# Patient Record
Sex: Female | Born: 1955
Health system: Southern US, Community
[De-identification: ages and names within clinical notes are randomized; demographics above are authoritative.]

## PROBLEM LIST (undated history)

## (undated) ENCOUNTER — Emergency Department (HOSPITAL_COMMUNITY): Admission: EM | Payer: Medicare Other | Source: Home / Self Care

## (undated) DIAGNOSIS — E785 Hyperlipidemia, unspecified: Secondary | ICD-10-CM

## (undated) DIAGNOSIS — I10 Essential (primary) hypertension: Secondary | ICD-10-CM

## (undated) DIAGNOSIS — R569 Unspecified convulsions: Secondary | ICD-10-CM

## (undated) DIAGNOSIS — I739 Peripheral vascular disease, unspecified: Secondary | ICD-10-CM

## (undated) DIAGNOSIS — J189 Pneumonia, unspecified organism: Secondary | ICD-10-CM

## (undated) DIAGNOSIS — E872 Acidosis, unspecified: Secondary | ICD-10-CM

## (undated) DIAGNOSIS — N186 End stage renal disease: Secondary | ICD-10-CM

## (undated) DIAGNOSIS — B2790 Infectious mononucleosis, unspecified without complication: Secondary | ICD-10-CM

## (undated) DIAGNOSIS — F32A Depression, unspecified: Secondary | ICD-10-CM

## (undated) DIAGNOSIS — F419 Anxiety disorder, unspecified: Secondary | ICD-10-CM

## (undated) DIAGNOSIS — D649 Anemia, unspecified: Secondary | ICD-10-CM

## (undated) DIAGNOSIS — M1990 Unspecified osteoarthritis, unspecified site: Secondary | ICD-10-CM

## (undated) DIAGNOSIS — Z8679 Personal history of other diseases of the circulatory system: Secondary | ICD-10-CM

## (undated) DIAGNOSIS — Z9981 Dependence on supplemental oxygen: Secondary | ICD-10-CM

## (undated) DIAGNOSIS — I82890 Acute embolism and thrombosis of other specified veins: Secondary | ICD-10-CM

## (undated) DIAGNOSIS — D126 Benign neoplasm of colon, unspecified: Secondary | ICD-10-CM

## (undated) DIAGNOSIS — G0490 Encephalitis and encephalomyelitis, unspecified: Secondary | ICD-10-CM

## (undated) DIAGNOSIS — I779 Disorder of arteries and arterioles, unspecified: Secondary | ICD-10-CM

## (undated) DIAGNOSIS — E875 Hyperkalemia: Secondary | ICD-10-CM

## (undated) DIAGNOSIS — Z9289 Personal history of other medical treatment: Secondary | ICD-10-CM

## (undated) DIAGNOSIS — K219 Gastro-esophageal reflux disease without esophagitis: Secondary | ICD-10-CM

## (undated) DIAGNOSIS — N189 Chronic kidney disease, unspecified: Secondary | ICD-10-CM

## (undated) DIAGNOSIS — F329 Major depressive disorder, single episode, unspecified: Secondary | ICD-10-CM

## (undated) DIAGNOSIS — Z87442 Personal history of urinary calculi: Secondary | ICD-10-CM

## (undated) HISTORY — DX: Anemia, unspecified: D64.9

## (undated) HISTORY — PX: REMOVAL OF GASTROSTOMY TUBE: SHX6058

## (undated) HISTORY — DX: Hyperlipidemia, unspecified: E78.5

## (undated) HISTORY — DX: Disorder of arteries and arterioles, unspecified: I77.9

## (undated) HISTORY — DX: Chronic kidney disease, unspecified: N18.9

## (undated) HISTORY — DX: Unspecified convulsions: R56.9

## (undated) HISTORY — PX: DG AV DIALYSIS GRAFT DECLOT OR: HXRAD813

## (undated) HISTORY — DX: Personal history of other medical treatment: Z92.89

## (undated) HISTORY — PX: BRAIN BIOPSY: SHX905

## (undated) HISTORY — DX: Acidosis, unspecified: E87.20

## (undated) HISTORY — DX: Essential (primary) hypertension: I10

## (undated) HISTORY — DX: Benign neoplasm of colon, unspecified: D12.6

## (undated) HISTORY — DX: Pneumonia, unspecified organism: J18.9

## (undated) HISTORY — PX: COLONOSCOPY W/ POLYPECTOMY: SHX1380

## (undated) HISTORY — DX: Acidosis: E87.2

## (undated) HISTORY — PX: ABDOMINAL HYSTERECTOMY: SHX81

## (undated) HISTORY — PX: GASTROSTOMY TUBE PLACEMENT: SHX655

## (undated) HISTORY — DX: Gastro-esophageal reflux disease without esophagitis: K21.9

## (undated) HISTORY — DX: Hyperkalemia: E87.5

## (undated) HISTORY — DX: Peripheral vascular disease, unspecified: I73.9

---

## 1998-03-06 ENCOUNTER — Encounter: Payer: Self-pay | Admitting: Gastroenterology

## 1998-04-24 DIAGNOSIS — D126 Benign neoplasm of colon, unspecified: Secondary | ICD-10-CM

## 1998-04-24 HISTORY — DX: Benign neoplasm of colon, unspecified: D12.6

## 1998-05-03 ENCOUNTER — Emergency Department (HOSPITAL_COMMUNITY): Admission: EM | Admit: 1998-05-03 | Discharge: 1998-05-03 | Payer: Self-pay | Admitting: Emergency Medicine

## 1998-05-03 ENCOUNTER — Encounter: Payer: Self-pay | Admitting: Emergency Medicine

## 1998-05-11 ENCOUNTER — Encounter: Payer: Self-pay | Admitting: Gastroenterology

## 1998-05-11 ENCOUNTER — Other Ambulatory Visit: Admission: RE | Admit: 1998-05-11 | Discharge: 1998-05-11 | Payer: Self-pay | Admitting: Gastroenterology

## 1999-05-22 ENCOUNTER — Encounter: Payer: Self-pay | Admitting: Family Medicine

## 1999-05-22 ENCOUNTER — Ambulatory Visit (HOSPITAL_COMMUNITY): Admission: RE | Admit: 1999-05-22 | Discharge: 1999-05-22 | Payer: Self-pay | Admitting: Family Medicine

## 2000-02-28 ENCOUNTER — Other Ambulatory Visit: Admission: RE | Admit: 2000-02-28 | Discharge: 2000-02-28 | Payer: Self-pay | Admitting: Obstetrics and Gynecology

## 2000-03-05 ENCOUNTER — Encounter: Payer: Self-pay | Admitting: Obstetrics and Gynecology

## 2000-03-05 ENCOUNTER — Encounter: Admission: RE | Admit: 2000-03-05 | Discharge: 2000-03-05 | Payer: Self-pay | Admitting: Obstetrics and Gynecology

## 2005-02-05 ENCOUNTER — Other Ambulatory Visit: Admission: RE | Admit: 2005-02-05 | Discharge: 2005-02-05 | Payer: Self-pay | Admitting: Obstetrics and Gynecology

## 2005-02-20 ENCOUNTER — Ambulatory Visit: Payer: Self-pay | Admitting: Gastroenterology

## 2005-04-08 ENCOUNTER — Ambulatory Visit: Payer: Self-pay | Admitting: Gastroenterology

## 2005-05-09 ENCOUNTER — Ambulatory Visit: Payer: Self-pay | Admitting: Gastroenterology

## 2005-05-18 ENCOUNTER — Inpatient Hospital Stay (HOSPITAL_COMMUNITY): Admission: EM | Admit: 2005-05-18 | Discharge: 2005-05-24 | Payer: Self-pay | Admitting: Emergency Medicine

## 2005-07-04 ENCOUNTER — Ambulatory Visit (HOSPITAL_COMMUNITY): Admission: RE | Admit: 2005-07-04 | Discharge: 2005-07-04 | Payer: Self-pay | Admitting: Vascular Surgery

## 2005-07-04 HISTORY — PX: AV FISTULA PLACEMENT: SHX1204

## 2005-07-16 ENCOUNTER — Ambulatory Visit: Payer: Self-pay | Admitting: Cardiology

## 2005-07-16 ENCOUNTER — Encounter: Payer: Self-pay | Admitting: Cardiology

## 2005-07-16 ENCOUNTER — Ambulatory Visit (HOSPITAL_COMMUNITY): Admission: RE | Admit: 2005-07-16 | Discharge: 2005-07-16 | Payer: Self-pay | Admitting: Nephrology

## 2005-07-24 ENCOUNTER — Ambulatory Visit (HOSPITAL_COMMUNITY): Admission: RE | Admit: 2005-07-24 | Discharge: 2005-07-24 | Payer: Self-pay | Admitting: Vascular Surgery

## 2005-07-24 HISTORY — PX: DG AV DIALYSIS GRAFT DECLOT OR: HXRAD813

## 2005-08-20 ENCOUNTER — Ambulatory Visit (HOSPITAL_COMMUNITY): Admission: RE | Admit: 2005-08-20 | Discharge: 2005-08-20 | Payer: Self-pay | Admitting: *Deleted

## 2005-08-27 ENCOUNTER — Ambulatory Visit (HOSPITAL_COMMUNITY): Admission: RE | Admit: 2005-08-27 | Discharge: 2005-08-27 | Payer: Self-pay | Admitting: Vascular Surgery

## 2005-08-27 HISTORY — PX: AV FISTULA PLACEMENT W/ PTFE: SHX1205

## 2005-08-27 HISTORY — PX: AV FISTULA PLACEMENT: SHX1204

## 2005-10-01 ENCOUNTER — Ambulatory Visit (HOSPITAL_COMMUNITY): Admission: RE | Admit: 2005-10-01 | Discharge: 2005-10-01 | Payer: Self-pay | Admitting: Nephrology

## 2006-03-20 ENCOUNTER — Ambulatory Visit (HOSPITAL_COMMUNITY): Admission: RE | Admit: 2006-03-20 | Discharge: 2006-03-20 | Payer: Self-pay | Admitting: Nephrology

## 2006-09-24 ENCOUNTER — Ambulatory Visit (HOSPITAL_COMMUNITY): Admission: RE | Admit: 2006-09-24 | Discharge: 2006-09-24 | Payer: Self-pay | Admitting: Nephrology

## 2006-10-12 ENCOUNTER — Ambulatory Visit: Payer: Self-pay | Admitting: Vascular Surgery

## 2006-10-12 ENCOUNTER — Ambulatory Visit (HOSPITAL_COMMUNITY): Admission: RE | Admit: 2006-10-12 | Discharge: 2006-10-12 | Payer: Self-pay | Admitting: Vascular Surgery

## 2006-10-12 HISTORY — PX: THROMBECTOMY / ARTERIOVENOUS GRAFT REVISION: SUR1351

## 2006-10-16 ENCOUNTER — Ambulatory Visit (HOSPITAL_COMMUNITY): Admission: RE | Admit: 2006-10-16 | Discharge: 2006-10-16 | Payer: Self-pay | Admitting: Vascular Surgery

## 2006-10-16 HISTORY — PX: THROMBECTOMY / ARTERIOVENOUS GRAFT REVISION: SUR1351

## 2006-10-31 ENCOUNTER — Emergency Department (HOSPITAL_COMMUNITY): Admission: EM | Admit: 2006-10-31 | Discharge: 2006-10-31 | Payer: Self-pay | Admitting: Emergency Medicine

## 2006-12-15 ENCOUNTER — Ambulatory Visit (HOSPITAL_COMMUNITY): Admission: RE | Admit: 2006-12-15 | Discharge: 2006-12-15 | Payer: Self-pay | Admitting: Nephrology

## 2007-03-25 HISTORY — PX: KIDNEY TRANSPLANT: SHX239

## 2007-12-22 ENCOUNTER — Encounter: Admission: RE | Admit: 2007-12-22 | Discharge: 2007-12-22 | Payer: Self-pay

## 2009-04-17 ENCOUNTER — Encounter (INDEPENDENT_AMBULATORY_CARE_PROVIDER_SITE_OTHER): Payer: Self-pay | Admitting: *Deleted

## 2009-04-25 ENCOUNTER — Encounter: Payer: Self-pay | Admitting: Physician Assistant

## 2009-04-25 ENCOUNTER — Telehealth: Payer: Self-pay | Admitting: Gastroenterology

## 2009-04-25 ENCOUNTER — Ambulatory Visit: Payer: Self-pay | Admitting: Gastroenterology

## 2009-04-25 DIAGNOSIS — R112 Nausea with vomiting, unspecified: Secondary | ICD-10-CM

## 2009-04-25 DIAGNOSIS — I10 Essential (primary) hypertension: Secondary | ICD-10-CM | POA: Insufficient documentation

## 2009-04-25 DIAGNOSIS — K649 Unspecified hemorrhoids: Secondary | ICD-10-CM | POA: Insufficient documentation

## 2009-04-25 DIAGNOSIS — R634 Abnormal weight loss: Secondary | ICD-10-CM | POA: Insufficient documentation

## 2009-04-25 DIAGNOSIS — N189 Chronic kidney disease, unspecified: Secondary | ICD-10-CM | POA: Insufficient documentation

## 2009-04-25 DIAGNOSIS — R110 Nausea: Secondary | ICD-10-CM | POA: Insufficient documentation

## 2009-04-25 DIAGNOSIS — Z860100 Personal history of colon polyps, unspecified: Secondary | ICD-10-CM | POA: Insufficient documentation

## 2009-04-25 DIAGNOSIS — R197 Diarrhea, unspecified: Secondary | ICD-10-CM | POA: Insufficient documentation

## 2009-04-25 HISTORY — DX: Nausea with vomiting, unspecified: R11.2

## 2009-04-25 LAB — CONVERTED CEMR LAB
Basophils Absolute: 0 10*3/uL (ref 0.0–0.1)
Eosinophils Absolute: 0.1 10*3/uL (ref 0.0–0.7)
Eosinophils Relative: 1 % (ref 0–5)
HCT: 36.7 % (ref 36.0–46.0)
Lymphs Abs: 1.3 10*3/uL (ref 0.7–4.0)
MCV: 89.1 fL (ref 78.0–100.0)
Monocytes Relative: 4 % (ref 3–12)
Neutro Abs: 8.1 10*3/uL — ABNORMAL HIGH (ref 1.7–7.7)
RDW: 14.4 % (ref 11.5–15.5)

## 2009-04-27 LAB — CONVERTED CEMR LAB
ALT: 18 units/L (ref 0–35)
AST: 19 units/L (ref 0–37)
Alkaline Phosphatase: 86 units/L (ref 39–117)
BUN: 51 mg/dL — ABNORMAL HIGH (ref 6–23)
Glucose, Bld: 152 mg/dL — ABNORMAL HIGH (ref 70–99)
Sodium: 137 meq/L (ref 135–145)
Total Bilirubin: 0.3 mg/dL (ref 0.3–1.2)

## 2009-05-11 ENCOUNTER — Ambulatory Visit: Payer: Self-pay | Admitting: Gastroenterology

## 2009-05-11 DIAGNOSIS — R1012 Left upper quadrant pain: Secondary | ICD-10-CM | POA: Insufficient documentation

## 2009-05-14 LAB — CONVERTED CEMR LAB
Basophils Absolute: 0.4 10*3/uL — ABNORMAL HIGH (ref 0.0–0.1)
Basophils Relative: 6.7 % — ABNORMAL HIGH (ref 0.0–3.0)
Bilirubin, Direct: 0.2 mg/dL (ref 0.0–0.3)
Eosinophils Absolute: 0.1 10*3/uL (ref 0.0–0.7)
Eosinophils Relative: 1 % (ref 0.0–5.0)
GFR calc non Af Amer: 31.91 mL/min (ref >60)
MCV: 92.9 fL (ref 78.0–100.0)
Monocytes Relative: 2.3 % — ABNORMAL LOW (ref 3.0–12.0)
Neutrophils Relative %: 75 % (ref 43.0–77.0)
Platelets: 208 10*3/uL (ref 150.0–400.0)
Potassium: 3.3 meq/L — ABNORMAL LOW (ref 3.5–5.1)
RDW: 14.6 % (ref 11.5–14.6)
Sodium: 139 meq/L (ref 135–145)
Total Protein: 6.9 g/dL (ref 6.0–8.3)
WBC: 6 10*3/uL (ref 4.5–10.5)

## 2009-05-17 ENCOUNTER — Telehealth: Payer: Self-pay | Admitting: Gastroenterology

## 2009-05-18 ENCOUNTER — Ambulatory Visit: Payer: Self-pay | Admitting: Cardiology

## 2009-05-30 ENCOUNTER — Encounter: Payer: Self-pay | Admitting: Gastroenterology

## 2009-06-11 ENCOUNTER — Encounter (INDEPENDENT_AMBULATORY_CARE_PROVIDER_SITE_OTHER): Payer: Self-pay

## 2009-06-12 ENCOUNTER — Ambulatory Visit: Payer: Self-pay | Admitting: Gastroenterology

## 2009-06-19 ENCOUNTER — Ambulatory Visit: Payer: Self-pay | Admitting: Gastroenterology

## 2009-06-21 ENCOUNTER — Encounter: Payer: Self-pay | Admitting: Gastroenterology

## 2009-06-25 ENCOUNTER — Encounter: Payer: Self-pay | Admitting: Gastroenterology

## 2009-07-31 ENCOUNTER — Telehealth: Payer: Self-pay | Admitting: Gastroenterology

## 2009-08-01 ENCOUNTER — Encounter: Payer: Self-pay | Admitting: Gastroenterology

## 2009-08-13 ENCOUNTER — Ambulatory Visit: Payer: Self-pay | Admitting: Gastroenterology

## 2009-08-13 DIAGNOSIS — K219 Gastro-esophageal reflux disease without esophagitis: Secondary | ICD-10-CM | POA: Insufficient documentation

## 2009-08-13 DIAGNOSIS — K5289 Other specified noninfective gastroenteritis and colitis: Secondary | ICD-10-CM | POA: Insufficient documentation

## 2009-12-12 ENCOUNTER — Encounter: Payer: Self-pay | Admitting: Gastroenterology

## 2009-12-21 ENCOUNTER — Encounter: Payer: Self-pay | Admitting: Gastroenterology

## 2010-01-15 ENCOUNTER — Ambulatory Visit: Payer: Self-pay | Admitting: Gastroenterology

## 2010-04-14 ENCOUNTER — Encounter: Payer: Self-pay | Admitting: Nephrology

## 2010-04-15 ENCOUNTER — Ambulatory Visit: Admit: 2010-04-15 | Payer: Self-pay | Admitting: Gastroenterology

## 2010-04-23 NOTE — Progress Notes (Signed)
Summary: [facility]  [place]   Imported By: Phillis Knack 05/11/2009 14:52:50  _____________________________________________________________________  External Attachment:    Type:   Image     Comment:   External Document

## 2010-04-23 NOTE — Letter (Signed)
Summary: [facility] Instructions  [facility]  [place], [place] [address]   Phone: [phone]  Fax: [phone]       KAYLONIE BLANDFORD    1955/08/19    MRN: [id number]        Procedure Day /Date:  Tuesday 06/19/2009     Arrival Time: 12:30 pm      Procedure Time: 1:30 pm     Location of Procedure:                    _ x_  [place] (4th Floor)                        [place]   Starting 5 days prior to your procedure Thursday 3/24  do not eat nuts, seeds, popcorn, corn, beans, peas,  salads, or any raw vegetables.  Do not take any fiber supplements (e.g. Metamucil, Citrucel, and Benefiber).  THE DAY BEFORE YOUR PROCEDURE         DATE: Monday 3/28  1.  Drink clear liquids the entire day-NO SOLID FOOD  2.  Do not drink anything colored red or purple.  Avoid juices with pulp.  No orange juice.  3.  Drink at least 64 oz. (8 glasses) of fluid/clear liquids during the day to prevent dehydration and help the prep work efficiently.  CLEAR LIQUIDS INCLUDE: Water Jello Ice Popsicles Tea (sugar ok, no milk/cream) Powdered fruit flavored drinks Coffee (sugar ok, no milk/cream) Gatorade Juice: apple, white grape, white cranberry  Lemonade Clear bullion, consomm, broth Carbonated beverages (any kind) Strained chicken noodle soup Hard Candy                             4.  In the morning, mix first dose of MoviPrep solution:    Empty 1 Pouch A and 1 Pouch B into the disposable container    Add lukewarm drinking water to the top line of the container. Mix to dissolve    Refrigerate (mixed solution should be used within 24 hrs)  5.  Begin drinking the prep at 5:00 p.m. The MoviPrep container is divided by 4 marks.   Every 15 minutes drink the solution down to the next mark (approximately 8 oz) until the full liter is complete.   6.  Follow completed prep with 16 oz of clear liquid of your choice  (Nothing red or purple).  Continue to drink clear liquids until bedtime.  7.  Before going to bed, mix second dose of MoviPrep solution:    Empty 1 Pouch A and 1 Pouch B into the disposable container    Add lukewarm drinking water to the top line of the container. Mix to dissolve    Refrigerate  THE DAY OF YOUR PROCEDURE      DATE: Tuesday 3/29  Beginning at 8:30 a.m. (5 hours before procedure):         1. Every 15 minutes, drink the solution down to the next mark (approx 8 oz) until the full liter is complete.  2. Follow completed prep with 16 oz. of clear liquid of your choice.    3. You may drink clear liquids until 11:30 am (2 HOURS BEFORE PROCEDURE).   MEDICATION INSTRUCTIONS  Unless otherwise instructed, you should take regular prescription medications with a small sip of water   as early as possible the morning  of your procedure.    Additional medication instructions: Do not take fluid pill am of procedure         OTHER INSTRUCTIONS  You will need a responsible adult at least 53 years of age to accompany you and drive you home.   This person must remain in the waiting room during your procedure.  Wear loose fitting clothing that is easily removed.  Leave jewelry and other valuables at home.  However, you may wish to bring a book to read or  an iPod/MP3 player to listen to music as you wait for your procedure to start.  Remove all body piercing jewelry and leave at home.  Total time from sign-in until discharge is approximately 2-3 hours.  You should go home directly after your procedure and rest.  You can resume normal activities the  day after your procedure.  The day of your procedure you should not:   Drive   Make legal decisions   Operate machinery   Drink alcohol   Return to work  You will receive specific instructions about eating, activities and medications before you leave.    The above instructions have been reviewed and explained to  me by   Cornelia Copa RN  June 12, 2009 10:46 AM     I fully understand and can verbalize these instructions _____________________________ Date _________

## 2010-04-23 NOTE — Progress Notes (Signed)
Summary: vomiting   Phone Note Call from Patient Call back at [phone]  OR  [phone]   Caller: Patient Call For: Dr. Fuller Plan Reason for Call: Talk to Nurse Summary of Call: pt has appt on the 18th, but wants to kow what she can do to treat her symptoms until then... pt said she started with a stomach virus and since then has not been ale to eat anything without throwing it back up Initial call taken by: Lucien Mons,  April 25, 2009 8:12 AM  Follow-up for Phone Call        Left message for patient to call back [place], [facility]  April 25, 2009 8:55 AM  Patient has 3 week hx of nausea and vomiting and now diarrhea.  All started with a stomach virus.  Patient  states diarrhea right after everything she eats.  patient will come in and see Nicoletta Ba PA today at 11:00 Follow-up by: Barb Merino RN, CGRN,  April 25, 2009 9:06 AM

## 2010-04-23 NOTE — Letter (Signed)
Summary: Results Letter  [facility]  [place], [place] [address]   Phone: [phone]  Fax: [phone]        June 25, 2009 MRN: [id number]    [place], [place]  [address]    Dear Ms. Buchta,  I have been unable to reach you by phone.  I hope this letter finds you well.  Dr Fuller Plan has recommended you start on a new medication called Lialda to treat the colitis that was found during your colonoscopy.  I have enclosed a discount card to help with the co-pay on your insurance.  Please follow the instructions on the card for activation prior to using at the pharmacy.  I have sent the perscription to [facility] on [address].  You will take 2 pills one time a day.  Dr Fuller Plan also wants to see you back in the office on 08-13-09 at 9:30.  If this appointment date and time are not convenient for you please call  [phone] and reschedule.  Please bring any co-pays with your insurance and if you need to cancel or reschedule please call the above number 24 hours in advance of the appointment.    If you have any questions please call and ask to speak with [facility].    Sincerely,     Barb Merino RN, CGRN  This letter has been electronically signed by your physician.

## 2010-04-23 NOTE — Letter (Signed)
Summary: [facility]   Imported By: Bubba Hales 06/20/2009 10:38:14  _____________________________________________________________________  External Attachment:    Type:   Image     Comment:   External Document

## 2010-04-23 NOTE — Miscellaneous (Signed)
Summary: Lec previsit  Clinical Lists Changes  Medications: Added new medication of MOVIPREP 100 GM  SOLR (PEG-KCL-NACL-NASULF-NA ASC-C) As per prep instructions. - Signed Rx of MOVIPREP 100 GM  SOLR (PEG-KCL-NACL-NASULF-NA ASC-C) As per prep instructions.;  #1 x 0;  Signed;  Entered by: Cornelia Copa RN;  Authorized by: Ladene Artist MD [facility];  Method used: Electronically to [facility]  [phone]*, [address], [place], [place]  [address], Ph: [phone] or [phone], Fax: [phone] Observations: Added new observation of ALLERGY REV: Done (06/12/2009 10:23)    Prescriptions: MOVIPREP 100 GM  SOLR (PEG-KCL-NACL-NASULF-NA ASC-C) As per prep instructions.  #1 x 0   Entered by:   Cornelia Copa RN   Authorized by:   Ladene Artist MD [facility]   Signed by:   Cornelia Copa RN on 06/12/2009   Method used:   Electronically to        [facility]  [phone]* (retail)       [place], [place]  [address]       Ph: [phone] or [phone]       Fax: [phone]   RxID:   [id number]

## 2010-04-23 NOTE — Progress Notes (Signed)
Summary: triage  Medications Added ANUSOL-HC 25 MG SUPP (HYDROCORTISONE ACETATE) 1 pr q hs for 7 nights       Phone Note Call from [name] [name] Call back at [phone]   Caller: [name] [name] Call For: Dr. Fuller Plan Reason for Call: Talk to Nurse Summary of Call: would like sooner appt then currently sch'ed... pt says she is experiencing "hot flashes of fever" Initial call taken by: Lucien Mons,  Jul 31, 2009 10:10 AM  Follow-up for Phone Call        [name] [name] c/o bright red rectal bleeding. She also reports an increase in hot flashes.   She sees blood on the tissue when she wipes.  Her colitis symptoms have been under good control no cramping, pain, diarrhea  or mucus.  She notes an increase in bleeding in the last few days.  She reports a solid stool every am.  Currently on Lialda 2 by mouth once daily.  She has a follow up appointment with you on 08/13/09.  Please advise if needs additional treatment prior to appointment  Follow-up by: Barb Merino RN, CGRN,  Jul 31, 2009 10:22 AM  Additional Follow-up for Phone Call Additional follow up Details #1::        She as a mild nonspecific colitis and hemorrhoids. Start Anusol HC supp qd for 7 days then prn for hemorrhoidal bleeding.  She needs to see her PCP or nephrologist for her hot flashes, ? fevers. Keep her 5/23 appt with me. Additional Follow-up by: Ladene Artist MD Marval Regal,  Jul 31, 2009 10:35 AM    Additional Follow-up for Phone Call Additional follow up Details #2::    [name] [name] aware.  She has a follow up appointment at her Primary care next week. Follow-up by: Barb Merino RN, [place],  Jul 31, 2009 10:47 AM  New/Updated Medications: ANUSOL-HC 25 MG SUPP (HYDROCORTISONE ACETATE) 1 pr q hs for 7 nights Prescriptions: ANUSOL-HC 25 MG SUPP (HYDROCORTISONE ACETATE) 1 pr q hs for 7 nights  #7 x 0   Entered by:   Barb Merino RN, [place]   Authorized by:   Ladene Artist MD [facility]   Signed by:   Barb Merino RN, CGRN on 07/31/2009   Method used:    Electronically to        [facility]  [phone]* (retail)       [place], [place]  [address]       Ph: [phone] or [phone]       Fax: [phone]   RxID:   [id number]

## 2010-04-23 NOTE — Procedures (Signed)
Summary: Colonoscopy  Patient: [name] [name] Note: All result statuses are Final unless otherwise noted.  Tests: (1) Colonoscopy (COL)   COL Colonoscopy           [facility]     [place], [place]  [address]           COLONOSCOPY PROCEDURE REPORT           PATIENT:  [name] [name]  MR#:  [id number]     BIRTHDATE:  May 21, 1955, 53 yrs. old  GENDER:  female           ENDOSCOPIST:  [name] Sorenson T. Fuller Plan, MD, [facility]           PROCEDURE DATE:  06/19/2009     PROCEDURE:  Colonoscopy with biopsy     ASA CLASS:  Class III     INDICATIONS:  1) unexplained diarrhea  2) weight loss           MEDICATIONS:   Fentanyl 100 mcg IV, Versed 10 mg IV           DESCRIPTION OF PROCEDURE:   After the risks benefits and     alternatives of the procedure were thoroughly explained, informed     consent was obtained.  Digital rectal exam was performed and     revealed no abnormalities.   The LB [id number]-[id number] endoscope     was introduced through the anus and advanced to the terminal ileum     which was intubated for a short distance, limited by fair prep.     The quality of the prep was Moviprep fair.  The instrument was then     slowly withdrawn as the colon was fully examined.     <<PROCEDUREIMAGES>>           FINDINGS:  The terminal ileum appeared normal.  Abnormal appearing     mucosa in the cecum to the transverse colon. There was patchy     erythema and mildly edematous mucosa. Multiple biopsies were     obtained and sent to pathology.  Normal colon otherwise from the     splenic flexure to the rectum. Random biopsies taken and sent to     pathology.   Retroflexed views in the rectum revealed internal     hemorrhoids, small.  The time to cecum =  4  minutes. The scope     was then withdrawn (time =  10.67  min) from the patient and the     procedure completed.           COMPLICATIONS:  None           ENDOSCOPIC IMPRESSION:     1) Normal terminal ileum    2) Patcy abnormal mucosa in the right colon-R/O colitis     3) Internal hemorrhoids           RECOMMENDATIONS:     1) Await pathology results     2) Repeat Colonscopy in 10 years for routine CRC screening     3) EGD today           [name] Traub T. Fuller Plan, MD, [name] [name]           CC: [name] Area, MD           n.     [name] [name]. [name] [name] at 06/19/2009 02:19 PM           [name] [name] [name] [name]  Note: An exclamation mark (!) indicates a result that was not dispersed into the flowsheet. Document Creation Date: 06/19/2009 2:19 PM _______________________________________________________________________  (1) Order result status: Final Collection or observation date-time: 06/19/2009 14:13 Requested date-time:  Receipt date-time:  Reported date-time:  Referring Physician:   Ordering Physician: [name] [name] [phone]) Specimen Source:  Source: [name] [name] Order Number: [id number] Lab site:   Appended Document: Colonoscopy     Procedures Next Due Date:    Colonoscopy: 05/2019  Appended Document: Colonoscopy colon 05/2014

## 2010-04-23 NOTE — Letter (Signed)
Summary: Patient Notice- Colon Biospy Results  [facility]  [address] [place], [place] [address]   Phone: [phone]  Fax: [phone]        June 21, 2009 MRN: [id number]    [place], [place]  [address]    Dear [name] [name],  I am pleased to inform you that the biopsies taken during your recent colonoscopy did not show any evidence of cancer upon pathologic examination. The colon biosies showed a mild, nonspecific colitis. The duodenal biopsies showed chronic duodenitis.  Please call [phone] to schedule a return visit in 6-8 weeks to review your condition.  Continue with the treatment plan as outlined on the day of your      exam.  You should have a repeat colonoscopy examination in 10 years.  Please call us if you are having persistent problems or have questions about your condition that have not been fully answered at this time.  Sincerely,  [name] Artist MD [facility]  This letter has been electronically signed by your physician.  Appended Document: Patient Notice- Colon Biospy Results Letter mailed 4.1.11

## 2010-04-23 NOTE — Assessment & Plan Note (Signed)
Summary: follow up colitis/sheri   History of Present Illness Visit Type: Follow-up Visit Primary GI MD: Joylene Igo MD [facility] Primary Provider: Mauricia Area, MD Requesting Provider: n/a Chief Complaint: Patient is here for f/u colitis. Patient states that since beginning lialda, she feels better. She denies any appearance of blood in the stool. No abdominal pain, no diarrhea, no nausea or vomiting. History of Present Illness:   This is a return office visit for diarrhea, bloating, rectal bleeding and GERD. All her symptoms are under very good control, except for bloating, and mild hemorrhoidal prolapse. Her colonic biopsies showed a very minimal, nonspecific colitis, which did not have features of chronic inflammatory bowel disease.   GI Review of Systems    Reports bloating.      Denies abdominal pain, acid reflux, belching, chest pain, dysphagia with liquids, dysphagia with solids, heartburn, loss of appetite, nausea, vomiting, vomiting blood, weight loss, and  weight gain.      Reports hemorrhoids.     Denies anal fissure, black tarry stools, change in bowel habit, constipation, diarrhea, diverticulosis, fecal incontinence, heme positive stool, irritable bowel syndrome, jaundice, light color stool, liver problems, rectal bleeding, and  rectal pain.   Current Medications (verified): 1)  Norvasc 10 Mg Tabs (Amlodipine Besylate) .... One Tablet By Mouth Once Daily 2)  Lipitor 40 Mg Tabs (Atorvastatin Calcium) .... One Tablet By Mouth Once Daily 3)  Elite Magnesium 100 Mg Tabs (Magnesium) .... One Tablet By Mouth Once Daily 4)  Multivitamins   Tabs (Multiple Vitamin) .... One Tablet By Mouth Once Daily 5)  Aspirin 325 Mg  Tabs (Aspirin) .... One Tablet By Mouth Two Times A Day 6)  Omeprazole 40 Mg Cpdr (Omeprazole) .... One Tablet By Mouth Once Daily 7)  Vivelle-Dot 0.075 Mg/24hr Pttw (Estradiol) .... As Directed 8)  Amlodipine Besylate 10 Mg Tabs (Amlodipine Besylate) .... One  Tablet By Mouth Once Daily 9)  Mycophenolate Mofetil 250 Mg Caps (Mycophenolate Mofetil) .... Take Four Capsules By Mouth Two Times A Day 10)  Tacrolimus 5 Mg Caps (Tacrolimus) .... One Capsule By Mouth Two Times A Day 11)  Prednisone 5 Mg Tabs (Prednisone) .... As Directed 12)  Lialda 1.2 Gm Tbec (Mesalamine) .... 2.4 By Mouth Once Daily 13)  Tacrolimus 1 Mg Caps (Tacrolimus) .... Take Two Tables By Mouth Two Times A Day  Allergies (verified): 1)  ! Benadryl 2)  ! * Lisinopril  Past History:  Past Medical History: Chronic Kidney Disease-S/P RENAL TRANSPLANTS 2009 Pneumonia Hypertension Hyperlipidemia Hemorrhoids Grade I Distal esophagitis Adenomatous Colon Polyps 04/1998 Colitis, nonspecific  Past Surgical History: Reviewed history from 05/10/2009 and no changes required. Hysterectomy Bilateral Kidney Transplant 2009,BOTH  IN RIGHT ABDOMEN C-section  Family History: Reviewed history from 04/25/2009 and no changes required. No FH of Colon Cancer: Family History of Kidney Disease: Paternal 87 and Cousin  Family History of Heart Disease: Mother and Father   Social History: Reviewed history from 04/25/2009 and no changes required. Occupation: Disabled Patient has never smoked.  Alcohol Use - no Daily Caffeine Use: one daily  Illicit Drug Use - no  Review of Systems  The patient denies allergy/sinus, anemia, anxiety-new, arthritis/joint pain, back pain, blood in urine, breast changes/lumps, change in vision, confusion, cough, coughing up blood, depression-new, fainting, fatigue, fever, headaches-new, hearing problems, heart murmur, heart rhythm changes, itching, menstrual pain, muscle pains/cramps, night sweats, nosebleeds, pregnancy symptoms, shortness of breath, skin rash, sleeping problems, sore throat, swelling of feet/legs, swollen lymph glands, thirst -  excessive , urination - excessive , urination changes/pain, urine leakage, vision changes, and voice change.     Vital Signs:  Patient profile:   55 year old female Height:      67 inches Weight:      174 pounds BMI:     27.35 BSA:     1.91 Pulse rate:   64 / minute Pulse rhythm:   regular BP sitting:   124 / 74  (left arm)  Vitals Entered By: Madlyn Frankel CMA Deborra Medina) (Aug 13, 2009 9:23 AM)  Physical Exam  General:  Well developed, well nourished, no acute distress. Head:  Normocephalic and atraumatic. Eyes:  PERRLA, no icterus. Mouth:  No deformity or lesions, dentition normal. Lungs:  Clear throughout to auscultation. Heart:  Regular rate and rhythm; no murmurs, rubs,  or bruits. Abdomen:  Soft, nontender and nondistended. No masses, hepatosplenomegaly or hernias noted. Normal bowel sounds. Psych:  Alert and cooperative. Normal mood and affect.  Impression & Recommendations:  Problem # 1:  OTH&UNSPEC NONINFECTIOUS GASTROENTERITIS&COLITIS (ICD-558.9) Nonspecific mild colitis. Likely self-limited. Plan to continue EL the for 2 more months, and then discontinue with her diarrhea remains fully controlled.  Problem # 2:  GERD (ICD-530.81) Symptoms well controlled on omeprazole 40 mg daily and antireflux measures.  Problem # 3:  HEMORRHOIDS-INTERNAL (ICD-455.0) Hemorrhoidal bleeding has resolved, but she does note frequent prolapse. Resume Anusol HC suppositories for one to 2 weeks.  Problem # 4:  WEIGHT LOSS (ICD-783.21) Weight has stabilized and has increased 4 pounds since her last visit.  Problem # 5:  PERSONAL HX COLONIC POLYPS (ICD-V12.72) Surveillance colonoscopy recommended March 2016.  Patient Instructions: 1)  Your prescription has been sent to your pharmacy. 2)  Please schedule a follow-up appointment in 2 months.  3)  Copy sent to : Mauricia Area, MD 4)  The medication list was reviewed and reconciled.  All changed / newly prescribed medications were explained.  A complete medication list was provided to the patient / caregiver.  Prescriptions: ANUSOL-HC 25 MG  SUPP (HYDROCORTISONE ACETATE) one suppository into rectum at bedtime as needed  #14 x 2   Entered by:   Marlon Pel CMA ([place])   Authorized by:   Ladene Artist MD [facility]   Signed by:   Ladene Artist MD FACG on 08/13/2009   Method used:   Electronically to        [facility]  [phone]* (retail)       [place], [place]  [address]       Ph: [phone] or [phone]       Fax: [phone]   RxID:   [id number]   Appended Document: follow up colitis/sheri    Clinical Lists Changes  Observations: Added new observation of COLONNXTDUE: 05/2014 (08/13/2009 10:13)

## 2010-04-23 NOTE — Letter (Signed)
Summary: [facility]   Imported By: Bubba Hales 08/17/2009 08:26:27  _____________________________________________________________________  External Attachment:    Type:   Image     Comment:   External Document

## 2010-04-23 NOTE — Assessment & Plan Note (Signed)
Summary: nausea...as.   History of Present Illness Visit Type: Follow-up Visit Primary GI MD: Joylene Igo MD [facility] Primary Provider: Mauricia Area, MD  Requesting Provider: n/a Chief Complaint: follow-up visit diarrhea/abdominal cramping   weight loss  saw Amy esterwood last week History of Present Illness:   Ms. Oreilly returns for ongoing problems with diarrhea, left upper quadrant abdominal pain, weight loss, loss of appetite, and nausea. She was treated with a course of Flagyl and states her diarrhea slightly improved, but her other symptoms have not. She had an acute deterioration of her renal function, with a creatinine increasing to 3.7 and BUN to 51. She states her most recent creatinine was 2.0 done at [facility], and her baseline creatinine is about 1.5. This acute change was likely secondary to dehydration in the setting of prior renal transplants.   GI Review of Systems    Reports abdominal pain, loss of appetite, nausea, and  weight loss.     Location of  Abdominal pain: LUQ.    Denies acid reflux, belching, bloating, chest pain, dysphagia with liquids, dysphagia with solids, heartburn, vomiting, vomiting blood, and  weight gain.      Reports diarrhea.     Denies anal fissure, black tarry stools, change in bowel habit, constipation, diverticulosis, fecal incontinence, heme positive stool, hemorrhoids, irritable bowel syndrome, jaundice, light color stool, liver problems, rectal bleeding, and  rectal pain.   Current Medications (verified): 1)  Norvasc 10 Mg Tabs (Amlodipine Besylate) .... One Tablet By Mouth Once Daily 2)  Lipitor 40 Mg Tabs (Atorvastatin Calcium) .... One Tablet By Mouth Once Daily 3)  Elite Magnesium 100 Mg Tabs (Magnesium) .... One Tablet By Mouth Once Daily 4)  Multivitamins   Tabs (Multiple Vitamin) .... One Tablet By Mouth Once Daily 5)  Aspirin 325 Mg  Tabs (Aspirin) .... One Tablet By Mouth Two Times A Day 6)  Omeprazole 40 Mg Cpdr (Omeprazole) ....  One Tablet By Mouth Once Daily 7)  Vivelle-Dot 0.075 Mg/24hr Pttw (Estradiol) .... As Directed 8)  Amlodipine Besylate 10 Mg Tabs (Amlodipine Besylate) .... One Tablet By Mouth Once Daily 9)  Mycophenolate Mofetil 250 Mg Caps (Mycophenolate Mofetil) .... Take Four Capsules By Mouth Two Times A Day 10)  Tacrolimus 5 Mg Caps (Tacrolimus) .... One Capsule By Mouth Two Times A Day 11)  Prednisone 5 Mg Tabs (Prednisone) .... As Directed  Allergies (verified): 1)  ! Benadryl 2)  ! * Lisinopril  Past History:  Past Medical History: Reviewed history from 05/10/2009 and no changes required. Chronic Kidney Disease-S/P RENAL TRANSPLANTS 2009 Pneumonia Hypertension Hyperlipidemia Hemorrhoids Grade I Distal esophagitis Adenomatous Colon Polyps 04/1998  Past Surgical History: Reviewed history from 05/10/2009 and no changes required. Hysterectomy Bilateral Kidney Transplant 2009,BOTH  IN RIGHT ABDOMEN C-section  Family History: Reviewed history from 04/25/2009 and no changes required. No FH of Colon Cancer: Family History of Kidney Disease: Paternal 58 and Cousin  Family History of Heart Disease: Mother and Father   Social History: Reviewed history from 04/25/2009 and no changes required. Occupation: Disabled Patient has never smoked.  Alcohol Use - no Daily Caffeine Use: one daily  Illicit Drug Use - no  Review of Systems       The pertinent positives and negatives are noted as above and in the HPI. All other ROS were reviewed and were negative.   Vital Signs:  Patient profile:   55 year old female Height:      67 inches Weight:  170 pounds BMI:     26.72 Pulse rate:   68 / minute Pulse rhythm:   regular BP sitting:   114 / 68  (left arm)  Vitals Entered By: Randye Lobo NCMA (May 11, 2009 11:04 AM)  Physical Exam  General:  Well developed, well nourished, no acute distress. Head:  Normocephalic and atraumatic. Eyes:  PERRLA, no icterus. Mouth:  No  deformity or lesions, dentition normal. Lungs:  Clear throughout to auscultation. Heart:  Regular rate and rhythm; no murmurs, rubs,  or bruits. Abdomen:  Soft and nondistended. No masses, hepatosplenomegaly or hernias noted. Normal bowel sounds. Mild to moderate left upper quadrant tenderness to deep palpation without rebound or guarding. Psych:  Alert and cooperative. Normal mood and affect.  Impression & Recommendations:  Problem # 1:  ABDOMINAL PAIN, LEFT UPPER QUADRANT (ICD-789.02) The etiology of her symptoms is unclear. Proceed with abdominal imaging with either a CT or MRI. Await blood work today and then obtain recommendations from her nephrologist regarding the use of intravenous contrast agents. If the studies cannot be performed with intravenous contrast agents will proceed without. Orders: TLB-BMP (Basic Metabolic Panel-BMET) (99991111) TLB-Hepatic/Liver Function Pnl (80076-HEPATIC) TLB-CBC Platelet - w/Differential (85025-CBCD) TLB-Lipase (83690-LIPASE)  Problem # 2:  WEIGHT LOSS (ICD-783.21) As and problem #1. Consider colonoscopy and upper endoscopy if imaging studies are not diagnostic. Orders: TLB-BMP (Basic Metabolic Panel-BMET) (99991111) TLB-Hepatic/Liver Function Pnl (80076-HEPATIC) TLB-CBC Platelet - w/Differential (85025-CBCD) TLB-Lipase (83690-LIPASE)  Problem # 3:  DIARRHEA (ICD-787.91)  Problem # 4:  RENAL FAILURE-CHRONIC (ICD-585.9) Acute exacerbation of chronic renal failure. BMET as above. See problem #1.  Problem # 5:  PERSONAL HX COLONIC POLYPS (ICD-V12.72)  Patient Instructions: 1)  Get your labs drawn today in the basement.  2)  We will call you after these lab results come back. 3)  Copy sent to : Mauricia Area, MD 4)  The medication list was reviewed and reconciled.  All changed / newly prescribed medications were explained.  A complete medication list was provided to the patient / caregiver.

## 2010-04-23 NOTE — Letter (Signed)
Summary: New Patient letter  [facility]  [address] [place], [place] [address]   Phone: [phone]  Fax: [phone]       04/17/2009 MRN: [id number]  [name] [name] [address] [place], [place]  [address]  Dear Ms. Faivre,  Welcome to the Gastroenterology Division at [facility].    You are scheduled to see Dr.  Fuller Plan on 05/11/2009 at 11:00AM on the 3rd floor at [facility], [place] [facility].  We ask that you try to arrive at our office 15 minutes prior to your appointment time to allow for check-in.  We would like you to complete the enclosed self-administered evaluation form prior to your visit and bring it with you on the day of your appointment.  We will review it with you.  Also, please bring a complete list of all your medications or, if you prefer, bring the medication bottles and we will list them.  Please bring your insurance card so that we may make a copy of it.  If your insurance requires a referral to see a specialist, please bring your referral form from your primary care physician.  Co-payments are due at the time of your visit and may be paid by cash, check or credit card.     Your office visit will consist of a consult with your physician (includes a physical exam), any laboratory testing he/she may order, scheduling of any necessary diagnostic testing (e.g. x-ray, ultrasound, CT-scan), and scheduling of a procedure (e.g. Endoscopy, Colonoscopy) if required.  Please allow enough time on your schedule to allow for any/all of these possibilities.    If you cannot keep your appointment, please call [phone] to cancel or reschedule prior to your appointment date.  This allows [place] the opportunity to schedule an appointment for another patient in need of care.  If you do not cancel or reschedule by 5 p.m. the business day prior to your appointment date, you will be charged a $50.00 late cancellation/no-show fee.    Thank you for choosing  [facility] for your medical needs.  We appreciate the opportunity to care for you.  Please visit [place] at our website  to learn more about our practice.                     Sincerely,                                                             The Gastroenterology Division

## 2010-04-23 NOTE — Procedures (Signed)
Summary: Upper Endoscopy  Patient: Bernese Jeong Note: All result statuses are Final unless otherwise noted.  Tests: (1) Upper Endoscopy (EGD)   EGD Upper Endoscopy       [facility]     [place], [place]  [address]           ENDOSCOPY PROCEDURE REPORT           PATIENT:  Robertia, Jarnagin  MR#:  [id number]     BIRTHDATE:  October 27, 1955, 53 yrs. old  GENDER:  female           ENDOSCOPIST:  Norberto Sorenson T. Fuller Plan, MD, [facility]           PROCEDURE DATE:  06/19/2009     PROCEDURE:  EGD with biopsy     ASA CLASS:  Class III     INDICATIONS:  abdominal pain, left upper quadr, diarrhea, weight     loss           MEDICATIONS:  Residual sedation present from prior procedure,     Fentanyl 25 mcg IV, Versed 2 mg IV     TOPICAL ANESTHETIC:  Exactacain Spray           DESCRIPTION OF PROCEDURE:   After the risks benefits and     alternatives of the procedure were thoroughly explained, informed     consent was obtained.  The LB GIF-H180 [id number] endoscope was     introduced through the mouth and advanced to the second portion of     the duodenum, without limitations.  The instrument was slowly     withdrawn as the mucosa was fully examined.     <<PROCEDUREIMAGES>>           The esophagus and gastroesophageal junction were completely normal     in appearance.  The stomach was entered and closely examined. The     pylorus, antrum, angularis, and lesser curvature were well     visualized, including a retroflexed view of the cardia and fundus.     The stomach wall was normally distensable. The scope passed easily     through the pylorus into the duodenum. The duodenal bulb was     normal in appearance, as was the postbulbar duodenum. Random     biopsies were obtained and sent to pathology. Retroflexed views     revealed no abnormalities.  The scope was then withdrawn from the     patient and the procedure completed.           COMPLICATIONS:  None        ENDOSCOPIC IMPRESSION:     1) Normal EGD           RECOMMENDATIONS:     1) Await pathology results           Alecea Trego T. Fuller Plan, MD, [facility]           CC:  Mauricia Area, MD           n.     Lorrin MaisPricilla Riffle. Lynda Wanninger at 06/19/2009 02:28 PM           Renard Matter, [id number]  Note: An exclamation mark (!) indicates a result that was not dispersed into the flowsheet. Document Creation Date: 06/19/2009 2:29 PM _______________________________________________________________________  (1) Order result status: Final Collection or observation date-time: 06/19/2009 14:24 Requested date-time:  Receipt date-time:  Reported date-time:  Referring Physician:   Ordering  Physician: Lucio Edward [phone]) Specimen Source:  Source: Tawanna Cooler Order Number: [id number] Lab site:

## 2010-04-23 NOTE — Procedures (Signed)
Summary: Colonoscopy/[facility]  Colonoscopy/ Healthcare   Imported By: Phillis Knack 05/11/2009 14:50:08  _____________________________________________________________________  External Attachment:    Type:   Image     Comment:   External Document

## 2010-04-23 NOTE — Assessment & Plan Note (Signed)
Summary: diarrhea/no appetitie/[name] [name]   History of Present Illness Visit Type: new patient  Primary GI MD: Joylene Igo MD [facility] Primary Provider: Mauricia Area, MD  Requesting Provider: n/a Chief Complaint: Left side abd pain, nausea, loss of appetite, weight loss, and diarrhea History of Present Illness:   55 YO FEMALE KNOWN TO DR. Fuller Plan FROM PRIOR EGD AND COLONOSCOPY DONE IN 2007. SHE HAD REFLUX ESOPHAGITIS AT THAT TIME. COLONSCOPY SHOWED INT. HEMORRHOIDS AND MULTIPLE  SMALL POLYPS WHICH WERE HYPERPLASTIC. SHE SINCE DEVELOPED RENAL FAILURE AND HAS UNDERGONE BILATERAL RENAL TRANSPLANTS IN 2009. SHE IS IMMUNOSUPPRESSED.  SHE COMES IN TODAY WITH C/O 3 WEEKS OF ILLNESS WITH WHAT INITIALLY FELT LIKE A STOMACH VIRUS WITH N/V, AND DIARRHEA, LOW GRADE FEVER. HER SXS HAVE PERSISTED,NOW PRIMARILY WITH DIARRHEA-LIQUID, MALODOROUS STOOLS, NO BLOOD. SHE IS HAVING AT LEAST 7-8 BMS/DAY, AND A COUPLE OF NOCTURNAL EPISODES OF DIARRHEA AS WELL. SHE FEELS WEAK, HAS LOST ABOUT 13 POUNDS. SHE IS STILL NAUSEATED BUT ABLE TO KEEP FOOD DOWN-FORCING HERSELF TO EAT. NO RECENT ANTIBIOTICS, NO FAMILY ILL ETC.      GI Review of Systems    Reports loss of appetite, nausea, vomiting, and  weight loss.   Weight loss of 13 pounds over 3 WEEKS.   Denies abdominal pain, acid reflux, bloating, chest pain, dysphagia with liquids, dysphagia with solids, heartburn, and  vomiting blood.      Reports change in bowel habits and  diarrhea.     Denies anal fissure, black tarry stools, fecal incontinence, heme positive stool, hemorrhoids, irritable bowel syndrome, jaundice, light color stool, liver problems, rectal bleeding, and  rectal pain.   Current Medications (verified): 1)  Cozaar 100 Mg Tabs (Losartan Potassium) .... One Tablet By Mouth Once Daily 2)  Norvasc 10 Mg Tabs (Amlodipine Besylate) .... One Tablet By Mouth Once Daily 3)  Lipitor 40 Mg Tabs (Atorvastatin Calcium) .... One Tablet By Mouth Once Daily 4)  Elite  Magnesium 100 Mg Tabs (Magnesium) .... One Tablet By Mouth Once Daily 5)  Multivitamins   Tabs (Multiple Vitamin) .... One Tablet By Mouth Once Daily 6)  Aspirin 325 Mg  Tabs (Aspirin) .... One Tablet By Mouth Two Times A Day 7)  Omeprazole 40 Mg Cpdr (Omeprazole) .... One Tablet By Mouth Once Daily 8)  Vivelle-Dot 0.075 Mg/24hr Pttw (Estradiol) .... As Directed 9)  Furosemide 80 Mg Tabs (Furosemide) .... One Tablet By Mouth Once Daily 10)  Amlodipine Besylate 10 Mg Tabs (Amlodipine Besylate) .... One Tablet By Mouth Once Daily 11)  Mycophenolate Mofetil 250 Mg Caps (Mycophenolate Mofetil) .... Take Four Capsules By Mouth Two Times A Day 12)  Tacrolimus 5 Mg Caps (Tacrolimus) .... One Capsule By Mouth Two Times A Day 13)  Prednisone 5 Mg Tabs (Prednisone) .... As Directed  Allergies (verified): 1)  ! Benadryl 2)  ! * Lisinopril  Past History:  Past Medical History: Chronic Kidney Disease-S/P RENAL TRANSPLANTS 2009 Pneumonia Colon Polyps /HYPERPLASTIC Hypertension Hyperlipidemia  Past Surgical History: Hysterectomy Bilateral Kidney Transplant 2009,BOTH  IN RIGHT ABDOMEN  Family History: No FH of Colon Cancer: Family History of Kidney Disease: Paternal 30 and Cousin  Family History of Heart Disease: Mother and Father   Social History: Occupation: Disabled Patient has never smoked.  Alcohol Use - no Daily Caffeine Use: one daily  Illicit Drug Use - no Smoking Status:  never Drug Use:  no  Review of Systems       The patient complains of change in vision.  The  patient denies allergy/sinus, anemia, anxiety-new, arthritis/joint pain, back pain, blood in urine, breast changes/lumps, confusion, cough, coughing up blood, depression-new, fainting, fatigue, fever, headaches-new, hearing problems, heart murmur, heart rhythm changes, itching, menstrual pain, muscle pains/cramps, night sweats, nosebleeds, pregnancy symptoms, shortness of breath, skin rash, sleeping problems, sore  throat, swelling of feet/legs, swollen lymph glands, thirst - excessive , urination - excessive , urination changes/pain, urine leakage, vision changes, and voice change.         ROS OTHERWISE AS IN HPI  Vital Signs:  Patient profile:   55 year old female Height:      67 inches Weight:      173 pounds BMI:     27.19 BSA:     1.90 Pulse rate:   74 / minute Pulse rhythm:   regular BP sitting:   128 / 72  (left arm) Cuff size:   regular  Vitals Entered By: Hope Pigeon CMA (April 25, 2009 11:25 AM)  Physical Exam  General:  Well developed, well nourished, no acute distress. Head:  Normocephalic and atraumatic. Eyes:  PERRLA, no icterus. Lungs:  Clear throughout to auscultation. Heart:  Regular rate and rhythm; no murmurs, rubs,  or bruits. Abdomen:  SOFT, KIDNEY PALPABLE  IN RLQ, MILD TENDERNESS IN LEFT AND UPPER ABDOMEN,BS+ Rectal:  NOT DONE Extremities:  No clubbing, cyanosis, edema or deformities noted. Neurologic:  Alert and  oriented x4;  grossly normal neurologically. Psych:  Alert and cooperative. Normal mood and affect.  Impression & Recommendations:  Problem # 1:  DIARRHEA-PRESUMED INFECTIOUS (ICD-009.3) Assessment New 55 YO FEMALE  S/P RENAL TRANSPLANTS;WITH 3 WEEK HX OF DIARRHEA, NAUSEA, WT LOSS. SUSPECT INFECTIOUS LABS AS BELOW STOOL CULTURES AS BELOW START EMPIRIC FLAGYL 250 MG 4 X DAILY X 10 DAYS START FLORASTOR TWICE DAILY X 2 WEEKS BLAND DIET KEEP APPT WITH DR. Fuller Plan ON FEB 18TH FOR FOLLOW UP. PT ADVISED TO CALL IF SXS WORSEN OR FAIL TO IMPROVE IN THE INTERIM.  Problem # 2:  RENAL FAILURE-CHRONIC (ICD-585.9) Assessment: Comment Only  Problem # 3:  HYPERTENSION (ICD-401.9) Assessment: Comment Only  Problem # 4:  PERSONAL HX COLONIC POLYPS (ICD-V12.72) Assessment: Comment Only DUR FOR FOLLOW UP COLONOSCOPY 2017  Other Orders: TLB-CMP (Comprehensive Metabolic Pnl) (0000000) TLB-CBC Platelet - w/Differential (85025-CBCD) T-Culture, C-Diff Toxin  A/B [id number]) T-Culture, Stool ([id number]) T-Stool Giardia / Crypto- EIA (60454) T-Stool for O&P [id number]) T-Fecal WBC [id number])  Patient Instructions: 1)  Your physician has requested that you have the following labwork done today: Please go to to basement level. 2)  New [id number] and Flagyl called into [facility] [place]  3)  Keep the appointment scheduled for 05-11-09 at 11:00 AM.  4)  Call u s if your symptoms worsen. 5)  Copy sent to : Mauricia Area, MD 6)  The medication list was reviewed and reconciled.  All changed / newly prescribed medications were explained.  A complete medication list was provided to the patient / caregiver.  Prescriptions: FLORASTOR 250 MG CAPS (SACCHAROMYCES BOULARDII) Take 1 tab twice daily x 14 days  #28 x 0   Entered by:   Marisue Humble NCMA   Authorized by:   Alfredia Ferguson PA-c   Signed by:   Marisue Humble NCMA on 04/25/2009   Method used:   Telephoned to ...       [facility] [address] # [phone]* (retail)       [address]       [place], [place]  [id number]       Ph: [phone]       Fax: [phone]   [id number] FLAGYL 250 MG TABS (METRONIDAZOLE) take 1 tab 4 times daily x 10 days  #40 x 0   Entered by:   Marisue Humble NCMA   Authorized by:   Alfredia Ferguson PA-c   Signed by:   Marisue Humble NCMA on 04/25/2009   Method used:   Telephoned to ...       [facility] [address] # [phone]* (retail)       [address]       [place], [place]  [address]       Ph: [phone]       Fax: [phone]   RxID:   [id number]   Appended Document: Orders Update    Clinical Lists Changes  Orders: Added new Test order of T- * Misc. Laboratory test [phone]) - Signed

## 2010-04-23 NOTE — Procedures (Signed)
Summary: EGD   EGD  Procedure date:  05/09/2005  Findings:      Findings: Esophagitis  Location: [place]    EGD  Procedure date:  05/09/2005  Findings:      Findings: Esophagitis  Location: [place]   Patient Name: Thavy, Rowles. MRN:  Procedure Procedures: Panendoscopy (EGD) CPT: [id number].  Personnel: Endoscopist: Pricilla Riffle. Fuller Plan, MD, [facility].  Exam Location: Exam performed in Outpatient Clinic. Outpatient  Patient Consent: Procedure, Alternatives, Risks and Benefits discussed, consent obtained, from patient. Consent was obtained by the RN.  Indications Symptoms: Nausea. Vomiting. Reflux symptoms  History  Current Medications: Patient is not currently taking Coumadin.  Pre-Exam Physical: Performed May 09, 2005  Cardio-pulmonary exam, HEENT exam, Abdominal exam, Mental status exam WNL.  Comments: Pt. history reviewed/updated, physical exam performed prior to initiation of sedation?Yes Exam Exam Info: Maximum depth of insertion Duodenum, intended Duodenum. Vocal cords not visualized. Gastric retroflexion performed. ASA Classification: II. Tolerance: excellent.  Sedation Meds: Patient assessed and found to be appropriate for moderate (conscious) sedation. Residual sedation present from prior procedure today. Cetacaine Spray 2 sprays given aerosolized.  Monitoring: BP and pulse monitoring done. Oximetry used. Supplemental O2 given  Findings Normal: Proximal Esophagus to Mid Esophagus.  ESOPHAGEAL INFLAMMATION: Severity is mild, erythema only.  [place] Classification: Grade 0. ICD9: Esophagitis, Reflux: 530.11.  Normal: Cardia to Duodenal 2nd Portion.   Assessment  Diagnoses: 530.11: Esophagitis, Reflux.   Events  Unplanned Intervention: No unplanned interventions were required.  Unplanned Events: There were no complications. Plans Medication(s): PPI: QAM,   Patient Education: Patient given standard  instructions for: Reflux.  Disposition: After procedure patient sent to recovery. After recovery patient sent home.  Scheduling: Primary Care Provider, as planned  Office Visit, to [facility] T. Fuller Plan, MD, [facility], prn    This report was created from the original endoscopy report, which was reviewed and signed by the above listed endoscopist.    cc: Everlene Farrier, MD

## 2010-04-23 NOTE — Procedures (Signed)
Summary: Colonoscopy   Colonoscopy  Procedure date:  05/09/2005  Findings:      Results: Hemorrhoids.     Pathology:  Hyperplastic polyp.     Location:  [place].    Procedures Next Due Date:    Colonoscopy: 05/2010 Patient Name: [name] [name], [name] [name]. MRN:  Procedure Procedures: Colonoscopy CPT: [phone].    with biopsy. CPT: [id number].  Personnel: Endoscopist: Pricilla Riffle. Fuller Plan, MD, [facility].  Exam Location: Exam performed in Outpatient Clinic. Outpatient  Patient Consent: Procedure, Alternatives, Risks and Benefits discussed, consent obtained, from patient. Consent was obtained by the RN.  Indications  Surveillance of: Adenomatous Polyp(s). Initial polypectomy was performed in 2000. in Feb. Pathology of worst  polyp: tubular adenoma.  History  Current Medications: Patient is not currently taking Coumadin.  Pre-Exam Physical: Performed May 09, 2005. Cardio-pulmonary exam, Rectal exam, HEENT exam , Abdominal exam, Mental status exam WNL.  Comments: Pt. history reviewed/updated, physical exam performed prior to initiation of sedation?Yes Exam Exam: Extent of exam reached: Cecum, extent intended: Cecum.  The cecum was identified by appendiceal orifice and IC valve. The Cecum was reached at 1:45 PM. ended at 1:56 PM. Time for Withdrawl: 00:11. Colon retroflexion performed. ASA Classification: II. Tolerance: excellent.  Monitoring: Pulse and BP monitoring, Oximetry used. Supplemental O2 given.  Colon Prep Used MoviPrep for colon prep. Prep results: good.  Sedation Meds: Patient assessed and found to be appropriate for moderate (conscious) sedation. Fentanyl 100 mcg. given IV. Versed 13 given IV.  Comments: tortuous, elongated colon-moderately difficult procedure Findings MELANOSIS: Cecum to Rectum. Comments: mild diffuse melanosis.  MULTIPLE POLYPS: Rectum to Sigmoid Colon. minimum size 2 mm, maximum size 2 mm. Procedure:  biopsy without cautery, removed,  Polyp retrieved, 2 polyps Polyps sent to pathology. ICD9: Colon Polyps: 211.3.  HEMORRHOIDS: Internal. Size: Small. Not bleeding. Not thrombosed. ICD9: Hemorrhoids, Internal: 455.0.   Assessment  Diagnoses: 211.3: Colon Polyps.  455.0: Hemorrhoids, Internal.   Events  Unplanned Interventions: No intervention was required.  Unplanned Events: There were no complications. Plans  Post Exam Instructions: Post sedation instructions given. No aspirin or non-steroidal containing medications: 2 weeks.  Medication Plan: Await pathology.  Patient Education: Patient given standard instructions for: Polyps. Hemorrhoids.  Disposition: After procedure patient sent remain in endo suite for second procedure.  Scheduling/Referral: Colonoscopy, to [facility] T. Fuller Plan, MD, [facility], around May 09, 2010.    This report was created from the original endoscopy report, which was reviewed and signed by the above listed endoscopist.    cc: Everlene Farrier, MD

## 2010-04-23 NOTE — Progress Notes (Signed)
Summary: speak to nurse   Phone Note Call from Patient Call back at [phone]   Caller: Patient Call For: Fuller Plan Summary of Call: Patient has questions regarding meds she is suppose to take before treatment tomorrow.. Initial call taken by: Ronalee Red,  May 17, 2009 12:03 PM  Follow-up for Phone Call        all questions answered about CT scan and contrast Follow-up by: Barb Merino RN, CGRN,  May 17, 2009 1:42 PM

## 2010-04-23 NOTE — Procedures (Signed)
Summary: EGD/[facility]  EGD/[facility]   Imported By: Phillis Knack 05/11/2009 14:51:08  _____________________________________________________________________  External Attachment:    Type:   Image     Comment:   External Document

## 2010-04-26 NOTE — Letter (Signed)
Summary: [facility]   Imported By: Phillis Knack 01/11/2010 07:34:03  _____________________________________________________________________  External Attachment:    Type:   Image     Comment:   External Document

## 2010-05-17 ENCOUNTER — Encounter: Payer: Self-pay | Admitting: Gastroenterology

## 2010-05-17 ENCOUNTER — Ambulatory Visit (INDEPENDENT_AMBULATORY_CARE_PROVIDER_SITE_OTHER): Payer: Medicare Other | Admitting: Gastroenterology

## 2010-05-17 DIAGNOSIS — R1012 Left upper quadrant pain: Secondary | ICD-10-CM

## 2010-05-17 DIAGNOSIS — K219 Gastro-esophageal reflux disease without esophagitis: Secondary | ICD-10-CM

## 2010-05-17 DIAGNOSIS — K645 Perianal venous thrombosis: Secondary | ICD-10-CM

## 2010-05-21 NOTE — Assessment & Plan Note (Signed)
Summary: abdominal swelling and rectal itching   History of Present Illness Visit Type: Follow-up Visit Primary GI MD: Joylene Igo MD [facility] Primary Provider: Mauricia Area, MD Requesting Provider: n/a Chief Complaint: follow-up abdominal pain and rectal itching which is better at this time.   She does c/o LUQ pain under her L ribcage. History of Present Illness:    [name] [name] relates intermittent left upper quadrant and left chest pain associated with movement and bending. Her symptoms do not relate any digestive function.  She has no gastrointestinal complaints.   She underwent colonoscopy and upper endoscopy in March 2011 with the only finding of internal hemorrhoids.  She has intermittent hemorrhoidal itching which responds to  hemorrhoidal suppositories.   Reflux symptoms well controlled on omeprazole.   GI Review of Systems    Reports abdominal pain and  chest pain.     Location of  Abdominal pain: LUQ.    Denies acid reflux, belching, bloating, dysphagia with liquids, dysphagia with solids, heartburn, loss of appetite, nausea, vomiting, vomiting blood, weight loss, and  weight gain.        Denies anal fissure, black tarry stools, change in bowel habit, constipation, diarrhea, diverticulosis, fecal incontinence, heme positive stool, hemorrhoids, irritable bowel syndrome, jaundice, light color stool, liver problems, rectal bleeding, and  rectal pain.   Current Medications (verified): 1)  Norvasc 10 Mg Tabs (Amlodipine Besylate) .... One Tablet By Mouth Once Daily 2)  Lipitor 40 Mg Tabs (Atorvastatin Calcium) .... One Tablet By Mouth Once Daily 3)  Elite Magnesium 100 Mg Tabs (Magnesium) .... One Tablet By Mouth Once Daily 4)  Multivitamins   Tabs (Multiple Vitamin) .... One Tablet By Mouth Once Daily 5)  Aspirin 325 Mg  Tabs (Aspirin) .... One Tablet By Mouth Once Daily 6)  Omeprazole 40 Mg Cpdr (Omeprazole) .... One Tablet By Mouth Once Daily 7)  Vivelle-Dot 0.1 Mg/24hr  Pttw (Estradiol) .... Take As Directed 8)  Amlodipine Besylate 10 Mg Tabs (Amlodipine Besylate) .... One Tablet By Mouth Once Daily 9)  Mycophenolate Mofetil 250 Mg Caps (Mycophenolate Mofetil) .... Take Four Capsules By Mouth Two Times A Day 10)  Tacrolimus 5 Mg Caps (Tacrolimus) .... One Capsule By Mouth Once A Day 11)  Prednisone 5 Mg Tabs (Prednisone) .... As Directed 12)  Tacrolimus 1 Mg Caps (Tacrolimus) .... Take4  Tablets By Mouth At Bedtime 13)  Anusol-Hc 25 Mg Supp (Hydrocortisone Acetate) .... One Suppository Into Rectum At Bedtime As Needed 14)  Glipizide 5 Mg Xr24h-Tab (Glipizide) .[name].. 1 By Mouth Once Daily  Allergies (verified): 1)  ! Benadryl 2)  ! * Lisinopril  Past History:  Past Medical History: Chronic Kidney Disease-S/P RENAL TRANSPLANTS 2009 Pneumonia Hypertension Hyperlipidemia Hemorrhoids Grade I Distal esophagitis Adenomatous Colon Polyps 04/1998 Diabetes  Past Surgical History: Reviewed history from 05/10/2009 and no changes required. Hysterectomy Bilateral Kidney Transplant 2009,BOTH  IN RIGHT ABDOMEN C-section  Family History: Reviewed history from 04/25/2009 and no changes required. No FH of Colon Cancer: Family History of Kidney Disease: Paternal 39 and Cousin  Family History of Heart Disease: Mother and Father   Social History: Reviewed history from 04/25/2009 and no changes required. Occupation: Disabled Patient has never smoked.  Alcohol Use - no Daily Caffeine Use: one daily  Illicit Drug Use - no  Vital Signs:  Patient profile:   55 year old female Height:      67 inches Weight:      174 pounds BMI:     27.35  Pulse rate:   72 / minute Pulse rhythm:   regular BP sitting:   122 / 84  (left arm)  Vitals Entered By: [name] [name] NCMA (May 17, 2010 4:17 PM)  Physical Exam  General:  Well developed, well nourished, no acute distress. Head:  Normocephalic and atraumatic. Eyes:  PERRLA, no icterus. Ears:  Normal auditory  acuity. Mouth:  No deformity or lesions, dentition normal. Chest Wall:   Mild left costal margin tenderness to palpation Lungs:  Clear throughout to auscultation. Heart:  Regular rate and rhythm; no murmurs, rubs,  or bruits. Abdomen:   Mild left upper quadrant and epigastric tenderness to palpation particularly along her costal margin. normal bowel sounds, without guarding, without rebound, no hernia, no masses, and no hepatomegally or splenomegaly.   Neurologic:  Alert and  oriented x4;  grossly normal neurologically. Psych:  Alert and cooperative. Normal mood and affect.  Impression & Recommendations:  Problem # 1:  ABDOMINAL PAIN, LEFT UPPER QUADRANT (ICD-789.02)  Left upper quadrant and left costal margin pain. Features very typical for musculoskeletal symptoms. This does not appear to be a gastrointestinal process. I advised her to try Tylenol or Advil prior to exercising to see if this would help. She would benefit from learning abdominal muscle stretching  techniques.  Problem # 2:  GERD (ICD-530.81)  Continue omeprazole 40 mg by mouth every morning.  Problem # 3:  HEMORRHOIDS-INTERNAL (ICD-455.0)  Standard hemorrhoidal care instructions. Anusol suppositories daily when necessary  Problem # 4:  PERSONAL HX COLONIC POLYPS (ICD-V12.72)  Prior adenomatous colon polyps in February 2000. Surveillance colonoscopy recommended March 2016.  Patient Instructions: 1)  Take Advil or Tylenol as needed for musculoskeletal abdominal pain.  2)  Please schedule a follow-up appointment as needed.  3)  Copy sent to : Mauricia Area, MD 4)  The medication list was reviewed and reconciled.  All changed / newly prescribed medications were explained.  A complete medication list was provided to the patient / caregiver.

## 2010-06-06 ENCOUNTER — Encounter: Payer: BC Managed Care – PPO | Admitting: *Deleted

## 2010-06-18 ENCOUNTER — Encounter: Payer: Medicare Other | Attending: Nephrology | Admitting: *Deleted

## 2010-06-18 DIAGNOSIS — E119 Type 2 diabetes mellitus without complications: Secondary | ICD-10-CM | POA: Insufficient documentation

## 2010-06-18 DIAGNOSIS — Z713 Dietary counseling and surveillance: Secondary | ICD-10-CM | POA: Insufficient documentation

## 2010-08-06 ENCOUNTER — Encounter: Payer: Medicare Other | Attending: Nephrology | Admitting: *Deleted

## 2010-08-06 DIAGNOSIS — Z713 Dietary counseling and surveillance: Secondary | ICD-10-CM | POA: Insufficient documentation

## 2010-08-06 DIAGNOSIS — E119 Type 2 diabetes mellitus without complications: Secondary | ICD-10-CM | POA: Insufficient documentation

## 2010-08-06 NOTE — Op Note (Signed)
[name] [name], [name] [name]             ACCOUNT NO.:  [id number]   MEDICAL RECORD NO.:  [id number]          PATIENT TYPE:  AMB   LOCATION:  SDS                          FACILITY:  [facility]   PHYSICIAN:  Nelda Severe. Kellie Simmering, M.D.  DATE OF BIRTH:  [birth year]   DATE OF PROCEDURE:  10/16/2006  DATE OF DISCHARGE:  10/16/2006                               OPERATIVE REPORT   PREOPERATIVE DIAGNOSIS:  Recurrent thrombosis, right upper arm  arteriovenous Gore-Tex graft.   POSTOPERATIVE DIAGNOSIS:  Recurrent thrombosis, right upper arm  arteriovenous Gore-Tex graft.   OPERATIONS:  Thrombectomy AV Gore-Tex graft, right upper arm with  exploration of venous end and intraoperative shuntogram x2.   SURGEON:  Nelda Severe. Kellie Simmering, M.D.   FIRST ASSISTANT:  Luther Bradley, PA Student   ANESTHESIA:  Local.   PROCEDURE:  The patient was taken the operating room, placed in supine  position at which time the right upper extremity was prepped with  Betadine scrub and solution and draped in routine sterile manner.  After  infiltration of 1% Xylocaine, a longitudinal incision was made through  the previous wound in the right periaxillary area and the wound  reopened.  The Gore-Tex to axillary vein anastomosis exposed and 3000  inches heparin given intravenously.  Transverse opening was made in the  graft just proximal to the anastomosis.  The anastomosis itself was  widely patent with no evidence of any technical problems.  The Fogarty  would traverse the central veins into the chest with some possible mild  narrowing in the subclavian vein area but nothing severe and heparin  saline could be flushed under low resistance.  The graft itself was  easily thrombectomized.  A 5 dilator would easily traverse the graft.  Graftotomy was reclosed with 6-0 Prolene.  Clamps released and there was  good pulse and thrill in the graft.  Intraoperative shuntogram was  performed which revealed the graft to be widely patent with no  evidence  of any significant degeneration or stenosis.  Arterial anastomosis  widely patent as well.  Wound was closed in layers with Vicryl in  subcuticular fashion.  Sterile dressing applied.  The patient taken to  recovery in satisfactory condition.      Nelda Severe Kellie Simmering, M.D.  Electronically Signed     JDL/MEDQ  D:  10/16/2006  T:  10/17/2006  Job:  [id number]

## 2010-08-06 NOTE — Op Note (Signed)
[name] [name], [name] [name]             ACCOUNT NO.:  [id number]   MEDICAL RECORD NO.:  [id number]          PATIENT TYPE:  AMB   LOCATION:  SDS                          FACILITY:  [facility]   PHYSICIAN:  Nelda Severe. Kellie Simmering, M.D.  DATE OF BIRTH:  [birth year]   DATE OF PROCEDURE:  10/12/2006  DATE OF DISCHARGE:                               OPERATIVE REPORT   PREOPERATIVE DIAGNOSIS:  Thrombosed arteriovenous Gore-Tex graft, right  upper extremity, with end-stage renal disease.   POSTOPERATIVE DIAGNOSIS:  Thrombosed arteriovenous Gore-Tex graft, right  upper extremity, with end-stage renal disease.   OPERATION:  Thrombectomy, arteriovenous Gore-Tex graft, right arm, with  insertion of new segment of graft to existing to graft axillary vein  with 6 mm Gore-Tex.   SURGEON:  Nelda Severe. Kellie Simmering, M.D.   FIRST ASSISTANT:  Nurse.   ANESTHESIA:  Local.   PROCEDURE:  The patient was taken to the operating room, placed in a  supine position, at which time the right upper extremity was prepped  with Betadine scrub and solution and draped in a routine sterile manner.  After infiltration of 1% Xylocaine with epinephrine, a longitudinal  incision was made in the axillary area, Gore-Tex to axillary vein  anastomosis dissected free.  No heparin was given.  A transverse opening  was made in the graft just proximal to the anastomosis.  The graft  itself easily thrombectomized with excellent flow being present.  A #5  dilator would easily traverse the graft with no irregularity and  excellent flow.  There was intimal hyperplasia extending about 3 cm in  the vein and proximal to this it was a 6 mm vein.  This was resected, a  new piece of graft anastomosed end-to-end to the old graft and end-to-  end to the proximal vein with 6-0 Prolene, clamps released, and there  was an excellent pulse and palpable thrill in the graft.  No protamine  or heparin were given.  The wound was irrigated with saline, closed in  layers  with Vicryl in a subcuticular fashion.  Sterile dressing applied.  The patient taken to the recovery room in satisfactory condition.      Nelda Severe Kellie Simmering, M.D.  Electronically Signed     JDL/MEDQ  D:  10/12/2006  T:  10/12/2006  Job:  [id number]

## 2010-08-09 NOTE — Consult Note (Signed)
NAMEMAVIS, KARNITZ             ACCOUNT NO.:  [id number]   MEDICAL RECORD NO.:  [id number]          PATIENT TYPE:  INP   LOCATION:  2925                         FACILITY:  [facility]   PHYSICIAN:  Jill Alexanders, M.D.  DATE OF BIRTH:  Jul 31, 1955   DATE OF CONSULTATION:  05/18/2005  DATE OF DISCHARGE:                                   CONSULTATION   HISTORY OF PRESENT ILLNESS:  Claud Kelp. Maseda is a 55 year old left handed  black female, born [birth year] with a history of hypertension followed  by Dr. Maury Dus. This patient is admitted to [facility] this  evening after her second seizure event at home. The patient had not been  feeling well for about 3-4 weeks prior to this admission with a recent bout  of pneumonia and problems with fatigue. This patient has been noted to be  somewhat twitchy on all 4 extremities over the last 3 weeks. The patient had  an episode yesterday of trembling that she recalls but then lost  consciousness, did not bite her tongue, jerked on all 4's, had fecal  incontinence. The patient had a similar episode today witnessed by her  husband, again with loss of consciousness, no fecal incontinence was noted  today but the patient did foam at the mouth and jerk on all 4's. The patient  was brought to the emergency room and was noted to have evidence of acute  renal failure with a BUN of 142, creatinine of 27.2. CT scan of the head was  unremarkable. Urine drug screen was negative. Neurology was called for  further evaluation.   PAST MEDICAL HISTORY:  1.  History of severe hypertension.  2.  Acute renal failure.  3.  Seizure secondary to #2.  4.  Hysterectomy.   MEDICATIONS:  1.  Benicar 40 mg daily.  2.  Toprol 75 mg daily.  3.  Norvasc 10 mg a day.  4.  Omeprazole 20 mg a day.   ALLERGIES:  The patient states an allergy to Daypro.   SOCIAL HISTORY:  Does not smoke or drink.   SOCIAL HISTORY:  This patient lives in the [place] area,  is married, has two children who are alive and well. The patient works as a  Solicitor. Husband also works.   FAMILY HISTORY:  Mother died with intracranial hemorrhage. Father died at  age 26 with heart disease. The patient has two brothers, one who passed away  with heart disease, one brother is alive and well. No sisters.   REVIEW OF SYSTEMS:  Notable for no recent fevers or chills. The patient  denies significant headache, had a mild headache the day after her seizure.  Denies any visual field changes, denies problem swallowing, neck pain.  Denies shortness of breath, chest pains. Has had some nausea for several  weeks prior to this admission, some vomiting. Denies any problems  controlling the bowels or the bladder. Denies any numbness on the face, arms  or legs or any focal weakness. Denies dizziness.   PHYSICAL EXAMINATION:  VITAL SIGNS: Blood pressure is  186/102, heart rate  82, respiratory rate 18, temperature afebrile.  GENERAL: In general this patient is a minimally obese black female who is  alert and cooperative at the time of examination.  HEENT: Head is atraumatic. Eyes, pupils are equal, round, and reactive to  light. Discs are flat bilaterally.  NECK: Neck is supple. No carotid bruits noted.  RESPIRATORY: Clear.  CARDIOVASCULAR: Regular rate and rhythm. No obvious murmurs or rubs noted.  EXTREMITIES: Without significant edema.  NEUROLOGIC: Cranial nerves as above. Facial symmetry is present. The patient  has good sensation of face to pinprick and soft touch bilaterally. He has  good strength to facial muscles. Muscles to head turning and shoulder shrug  are normal strength bilaterally. Speech is well enunciated, not aphasic.  Motor testing was 5/5 strength in all 4's with good symmetric motor tone  noted throughout. Sensory testing is intact to pinprick, soft touch and  vibratory sensation throughout. The patient has good finger-nose-finger  and  heel-to-shin. Gait was not tested. No drift is seen. Deep tendon reflexes  are symmetric throughout. Toes neutral bilaterally. No asterixis or  myoclonus was observed during the examination today.   LABORATORY/X-RAY DATA:  Laboratory values are notable for sodium of 140,  potassium 5.3, chloride of 110, CO2 of 10, glucose of 86, BUN of 142,  creatinine 27.2, calcium of 6.5, total protein 6.9, albumin 3, AST 11, ALT  of 15, alk phosphatase of 76, total bili 0.7. Urine drug screen negative.  White count of 10.1, hemoglobin of 6.5, hematocrit of 18.7, MCV of 90,  platelets are 250. Again, CT of the head is as above.   IMPRESSION:  1.  New onset of acute renal failure.  2.  Hypertension.  3.  Seizures secondary to #1.   This patient likely has symptomatic seizures associated with her acute renal  failure. The patient likely has been going into renal failure for 3 weeks or  more prior to this admission. The patient has had what sounds like myoclonus  for several weeks culminating with a generalized seizure event. Will treat  the patient temporarily with anticonvulsant medications. I do not believe  the patient will require anticonvulsants on a long term basis.   PLAN:  1.  Will give 500 mg of IV Dilantin load and then 300 mg daily p.o.  2.  MRI of the brain.  3.  EEG study.  4.  Will follow the patient's clinical course while in house. Again I      suspect once the patient has been metabolically stabilized, Dilantin can      be tapered off. Will follow the patient's free and total levels of      Dilantin.      Jill Alexanders, M.D.  Electronically Signed     CKW/MEDQ  D:  05/18/2005  T:  05/19/2005  Job:  [id number]   cc:   Herbie Baltimore A. Alyson Ingles, M.D.  Fax: [phone]   Donato Heinz, M.D.  Fax: [phone]

## 2010-08-09 NOTE — Op Note (Signed)
[name] [name], [name] [name]             ACCOUNT NO.:  [id number]   MEDICAL RECORD NO.:  [id number]          PATIENT TYPE:  AMB   LOCATION:  SDS                          FACILITY:  [facility]   PHYSICIAN:  Jessy Oto. Fields, MD  DATE OF BIRTH:  1956/01/27   DATE OF PROCEDURE:  08/27/2005  DATE OF DISCHARGE:  08/27/2005                                 OPERATIVE REPORT   PROCEDURE:  1.  Removal of right forearm arteriovenous graft.  2.  Placement of right upper arm arteriovenous graft.   PREOPERATIVE DIAGNOSIS:  End stage renal disease.   POSTOPERATIVE DIAGNOSIS:  End stage renal disease.   ANESTHESIA:  Local with IV sedation.   OPERATIVE FINDINGS:  1.  Right forearm AV graft removed.  2.  6-mm PTFE right upper arm AV graft end-to-end on the venous anastomosis.   OPERATIVE DETAIL:  After obtaining informed consent, the patient is taken to  the operating room.  The patient was placed in a supine position on the  operating table.  After adequate sedation, the patient's entire right upper  extremity was prepped and draped in the usual sterile fashion.  Local  anesthesia was infiltrated at a pre-existing longitudinal scar just above  the elbow crease.  The incision was reopened and carried down through the  subcutaneous tissues and the old segment of graft to the vein was dissected  free circumferentially.  The vein was ligated distally and this segment of  thrombosed forearm graft excised.  Attention was then turned to dissection  of the brachial artery deep within this incision.  The brachial artery was  dissected free circumferentially just above the elbow crease and controlled  proximally and distally with vessel loops.  Next, local anesthesia was  infiltrated at a preexisting transverse scar in the antecubital crease.  The  incision was reopened and carried down through subcutaneous tissues down to  the level of the graft.  The arterial limb of the graft was dissected free  circumferentially and elevated up on the operative field.  It was then  clamped with a right angle just above the anastomosis and transected.  The  graft was then easily removed from the subcutaneous tissues. The tunnel was  then packed with thrombin and Gelfoam to achieve hemostasis.  The stump of  graft attached to the brachial artery was oversewn with a running 5-0  Prolene suture.  There was good brachial pulse below the level of repair  after this.  This was then packed with thrombin Gelfoam as well.   Attention was then turned to the right upper arm.  Local anesthesia was  infiltrated up near the axilla.  A longitudinal incision was made in this  location and carried down through subcutaneous tissues down to the level of  the axillary vein.  This was dissected free circumferentially.  Small side  branches were ligated with 2-0 silk ties.  Next, a subcutaneous tunnel was  created connecting the upper arm incision to the lower arm incision and a 6-  mm PTFE graft was brought through subcutaneous tunnel.  The patient was then  given 5000  units of intravenous heparin.  The graft was beveled on its  proximal end near the elbow crease.  The artery was controlled proximally  and distally and longitudinal arteriotomy was made.  The graft was then sewn  end of graft to side of artery using running 6-0 Prolene suture.  Just prior  to completion of the anastomosis, it was forward bled, back bled, and  thoroughly flushed.  The anastomosis was secured and the graft was clamped  near its distal segment.  The graft was then cut to length and the axillary  vein ligated distally.  It was then spatulated proximally.  The graft was  then sewn end of graft to end of vein using a running 6-0 Prolene suture.  Just prior to completion of the anastomosis, it was forward bled, back bled,  and thoroughly flushed.  The anastomosis was secured, clamps released, there  was a palpable thrill in the graft  immediately.  Hemostasis was obtained  with thrombin and Gelfoam in all incisions as well as administering 50 mg of  protamine.  The subcutaneous tissues of all three incisions were closed with  a running 3-0 Vicryl suture.  The skin of all three incisions was then  closed with a 4-0 Vicryl running subcuticular stitch.  There was an audible  radial Doppler and ulnar Doppler signal at the end of the case.  The patient  tolerated the procedure well and there were no complications.  Instrument,  sponge and needle counts were correct at the end of the case.  The patient  returned to the recovery room in stable condition.      Jessy Oto. Fields, MD  Electronically Signed     CEF/MEDQ  D:  08/27/2005  T:  08/27/2005  Job:  [id number]

## 2010-08-09 NOTE — Discharge Summary (Signed)
NAMELANYIAH, [name] [name]             ACCOUNT NO.:  [id number]   MEDICAL RECORD NO.:  [id number]          PATIENT TYPE:  INP   LOCATION:  [id number]                         FACILITY:  [facility]   PHYSICIAN:  Donato Heinz, M.D.DATE OF BIRTH:  15-Aug-1955   DATE OF ADMISSION:  05/18/2005  DATE OF DISCHARGE:  05/24/2005                                 DISCHARGE SUMMARY   ADMITTING DIAGNOSES:  1.  Seizures.  2.  Hypertensive emergency.  3.  Renal failure.  4.  Uremia.  5.  Metabolic acidosis.  6.  Anemia.  7.  Hyperkalemia.   DISCHARGE DIAGNOSES:  1.  End-stage renal disease on chronic hemodialysis.  2.  Status post placement of right internal jugular Diatek catheter, Dr.      Bartholome Bill, interventional radiology, May 21, 2005.  3.  Hypertension improving.  4.  Seizures secondary to uremia and uncontrolled hypertension, resolved.  5.  Anemia of chronic disease status post transfusion.  6.  Metabolic acidosis corrected.  7.  Hyperkalemia corrected.  8.  Secondary hyperparathyroidism.   BRIEF HISTORY:  A 55 year old black female with past medical history for  longstanding hypertension presents to the emergency room after her second  event of seizure-like activity. Reportedly she had an episode of  uncontrolled shaking and loss of bladder on February 22. She was seen by her  primary physician on February 23 with no specific treatment. She had another  witnessed seizure activity today and was sent to the ER. While in the ER  workup revealed a blood pressure 186/110, BUN 142, creatinine 27.2.  Retrospectively she reports nausea, vomiting, anorexia, weakness, and  malaise for the last 3 weeks. She is admitted for evaluation.   LABORATORIES ON ADMISSION:  pH 7.1. Bicarb 9.5 CO2 30. White count 10,100,  hemoglobin 6.5, hematocrit 18.7, platelets 250,000. Occult blood negative.  Sodium 140, potassium 5.3, chloride 110, CO2 10, glucose 86, BUN 142,  creatinine 27.2. Calcium 6.5. Albumin 3. Total  protein 6.9. LFTs within  normal limits. Phosphorus 11.3. Magnesium 2.9. TSH 3.163.   CT of the head is negative for noncontrast study. Renal ultrasound shows  normal size markedly echogenic kidneys with the right measuring 12.2 cm, the  left measuring 11 cm. There was 1.5 cm cyst lower pole left kidney and no  other focal lesions. No hydronephrosis or stones seen. Chest x-ray:  Moderate cardiomegaly, no evidence of edema or infiltrate.   HOSPITAL COURSE:  End-stage renal disease. Workup included ruling out  reversible causes of which none were found. HIV was nonreactive. CT level  75, slightly below normal with ranges of 88-201. C4 was within normal range  at 26. Drug screen was negative for benzodiazepines, cocaine, amphetamines,  cannabinoids, opiates, and barbiturates. A 24-hour protein collection  measured at 2.7 grams. ANCA was negative. Antistreptolysin and anti-DNA  antibody and glomerular basement membrane antibodies were all negative.  Discussions of starting dialysis took place early in admission. Due to  considerable anxiety and denial on the patient's part access placement was  delayed. Ultimately on February 28 the patient had a right Diatek placed in  the internal jugular  by interventional radiology, and she underwent her  first dialysis treatment. She tolerated it relatively well. She dialyzed a  total of three dialysis treatments with the lowest recorded weight post  dialysis at 67.7 kg which represents approximately 5 kg weight loss.  Outpatient arrangements were made at the [facility] to dialyze  every Tuesday, Thursday, Saturday at noon.   Other parameters included an intact PTH level of approximately 970. For this  she was treated with vitamin D using Hectorol 1.0 mcg IV each dialysis to be  increased after three doses to 1.5 mcg IV each dialysis.  Phosphate binder  of calcium carbonate was used at 1000 mg with each meal. Transferrin  saturation level  was within normal limits at 53% and ferritin at 621. She  did receive EPO 20,000 units IV each dialysis for her profound anemia. She  was transfused a total of 4 units packed red blood cells this  hospitalization. At time of discharge her hemoglobin is at 8.6 and stable.  Her dry weight is to be determined, and she will dialyze for four hours each  dialysis treatment three times a week. Her left arm is being saved for a  permanent fistula or graft, and appointment will be made with vascular  surgeons for access placement.   1.  Seizure activity. The patient was placed on antiepileptic upon admission      using Dilantin. Workup included an MRI of her brain which showed no      evidence of acute infarct or findings to suggest posterior reversible      encephalopathy syndrome. There was nonspecific white matter type      changes. Neurology was consulted. The EEG done showed mild slowing when      the patient was drowsy, but when alert it looked normal. They were not      convinced that this was pure seizure activity with patient not having      loss of consciousness. Ultimately, once her blood pressure control      improved, her Dilantin was stopped. She was discharged home off      Dilantin. Blood pressure at time of discharge was approximately 160/105.      She had been off Dilantin approximately 4 days prior to discharge, and      no seizure activity was observed. Her seizures were felt to be a      combination of uncontrolled blood pressure and severe uremia.   1.  Hypertension. Her antihypertensive medications were increased, and her      weight was decreased with dialysis. Blood pressure was gradually coming      down at time of discharge. As mentioned earlier her blood pressure was      approximately 160/105 at time of discharge on Toprol XL 100 mg at      bedtime and Norvasc 10 mg at bedtime. She was still with volume excess,     and her weight will continue to be taken down at the  [facility] center and      blood pressures followed.   DISCHARGE MEDICATIONS:  1.  Nephro-Vite vitamin 1 daily.  2.  Calcium carbonate 500 mg two with meals.  3.  Norvasc 10 mg at bedtime.  4.  Toprol XL 100 mg at bedtime.  5.  __________ 40 mg at bedtime.  6.  EPO 20,000 units IV each dialysis.  7.  Hectorol 1.0 mcg IV each dialysis x3, then increase to 1.5 mcg IV each  dialysis. Dialysis to be Tuesday, Thursday, Saturday at the [facility] at 12 noon. Save left arm for future graft or fistula.      Nonah Mattes, P.A.    ______________________________  Donato Heinz, M.D.    RRK/MEDQ  D:  08/31/2005  T:  09/01/2005  Job:  [id number]   cc:   Princess Bruins. Gaynell Face, M.D.  Fax: [phone]

## 2010-08-09 NOTE — Op Note (Signed)
[name] [name], [name] [name]             ACCOUNT NO.:  [id number]   MEDICAL RECORD NO.:  [id number]          PATIENT TYPE:  AMB   LOCATION:  SDS                          FACILITY:  [facility]   PHYSICIAN:  Rosetta Posner, M.D.    DATE OF BIRTH:  Oct 03, 1955   DATE OF PROCEDURE:  DATE OF DISCHARGE:                                 OPERATIVE REPORT   PREOPERATIVE DIAGNOSIS:  End-stage renal disease.   POSTOPERATIVE DIAGNOSIS:  End-stage renal disease.   PROCEDURE:  Placement of right forearm AV Gore-Tex graft.   SURGEON:  Rosetta Posner, M.D.   ASSISTANT:  Jacinta Shoe, P.A.-C.   ANESTHESIA:  MAC.   COMPLICATIONS:  None.   DISPOSITION:  Stable to the recovery room.   DESCRIPTION OF PROCEDURE:  The patient was taken to the operating room and  placed in the supine position.  The area of the left arm was prepped and  draped in the usual sterile fashion.   Using local anesthesia, an incision was made over the antecubital space and  carried down to isolate the cephalic vein which was of very small caliber.  This was mobilized proximally and distally and was ligated distally and  divided.  Attempts were made at dilating this, but this was clearly not  adequate for outflow or for cephalic vein fistula.  The basilic vein was  exposed through the same incision and was of small-to-moderate size.  The  brachial vein was exposed along side the brachial artery and was the best of  the three veins.  The patient had a normal-caliber brachial artery with a  normal pulse.  A separate incision was made over the distal forearm, and a  loop configuration tunnel was created in the forearm.  A 6-mm standard wall  stretch graft was brought through the tunnel.  The brachial vein was  occluded proximally and distally and was opened with an 11 blade and  scissors.  The graft was spatulated and sewn end-to-side of the vein with a  running 6-0 Prolene suture.  __________ was removed from the graft and the  vein  and the graft were flushed with heparinized saline and reoccluded.  Next, the brachial artery was occluded proximally and distally and was  opened with an 11 blade and scissors.  A small arteriotomy was created.  The  graft  was sewn end-to-side to the artery with a running 6-0 Prolene suture.  The  clamps were removed, and excellent thrill was noted.  The wound was  irrigated with saline.  Hemostasis was obtained with cautery.  The wound was  closed with 3-0 Vicryl in the subcutaneous and subcuticular tissues.  Benzoin and Steri-Strips were applied.      Rosetta Posner, M.D.  Electronically Signed     TFE/MEDQ  D:  07/24/2005  T:  07/25/2005  Job:  [id number]

## 2010-08-09 NOTE — Op Note (Signed)
[name] [name], [name] [name]             ACCOUNT NO.:  [id number]   MEDICAL RECORD NO.:  [id number]          PATIENT TYPE:  AMB   LOCATION:  SDS                          FACILITY:  [facility]   PHYSICIAN:  Dorothea Glassman, M.D.    DATE OF BIRTH:  10-01-1955   DATE OF PROCEDURE:  08/20/2005  DATE OF DISCHARGE:  08/20/2005                                 OPERATIVE REPORT   SURGEON:  Dorothea Glassman, MD   ASSISTANT:  Leta Baptist, PA.   ANESTHETIC:  Local with MAC.   PREOPERATIVE DIAGNOSIS:  Thrombosis right forearm, loop arteriovenous graft.   POSTOPERATIVE DIAGNOSIS:  Thrombosis right forearm, loop arteriovenous  graft.   PROCEDURE:  Thrombectomy and revision right forearm loop arteriovenous  graft.   OPERATIVE PROCEDURE:  The patient was brought to the operating room in  stable condition.  Placed in the supine position.  Right arm prepped and  draped in a sterile fashion.  Skin and subcutaneous tissues were instilled  with 1% Xylocaine.  A transverse skin incision made through the right  antecubital fossa.  Dissection carried through the subcutaneous tissue.  Venous and arterial limbs of the graft identified.  The arterial limb of the  graft was encircled with a vessel loop.  The venous limb of graft followed  down to an end-to-side anastomosis to the deep brachial vein.  The graft was  ligated, at this point, and divided transversely.  The graft thrombectomized  several times with a 4 and 5 Fogarty catheter.  Excellent inflow obtained.  The graft filled with heparin saline solution and controlled with a fistula  clamp.  A new segment of 6 mm Gore-Tex was anastomosed end-to-end to the  divided graft using running 6-0 Prolene suture.   A second longitudinal skin incision was then made more proximal in the arm.  Dissection carried down to expose the deep brachial vein.  This was ligated  distally with 2-0 silk, divided proximally, and beveled.  The 6-mm  interposition graft was tunneled  to the exposed deep brachial vein and  anastomosed end-to-end using running 7-0 Prolene suture.  Clamps were  removed.  Excellent flow present.  Adequate hemostasis obtained.  Sponges  and instrument counts correct.  The vein is noted to be small in caliber.  Subcutaneous tissue closed with a running 3-0 Vicryl suture.  Skin closed  with 4-0 Monocryl.  Steri-Strips applied.  The patient tolerated the  procedure well.  No apparent complications.  Transferred to recovery room in  stable condition.      Dorothea Glassman, M.D.  Electronically Signed     PGH/MEDQ  D:  08/20/2005  T:  08/20/2005  Job:  [id number]

## 2010-08-09 NOTE — Procedures (Signed)
CLINICAL HISTORY:  The patient is a 55 year old with history of  hypertension, who had seizures on Friday and Saturday lasting minutes  without memory for the event.  The patient is oriented AND alert during the  study.   PROCEDURE:  The tracing is carried out of 32-channel digital Cadwell  recorder, reformatted into 16-channel montages with one devoted to EKG. The  patient was awake during the recording.  The International 10/20 system lead  placement was used.   MEDICATIONS:  Toprol, Benecal, Norvasc, demeprazole, and Dilantin.   DESCRIPTION OF FINDINGS:  1.  Dominant frequency at the end of the record, when the patient is awake,      is a 15 microvolts, 8 Hz activity.  Superimposed upon this is frontally      predominant beta range components and occasional theta range activity.  2.  The majority the record is 7 Hz of theta range activity of 15-25      microvolts.  Occasional lower theta, upper delta range activity is seen.  3.  Intermittent photic stimulation induced a driving response only at 9 Hz.  4.  Hyperventilation was carried out and caused arousal in the background      but no other changes.  5.  There was no focal slowing.  6.  There was no interictal epileptiform activity in the form of spikes or      sharp waves.  7.  EKG showed a regular sinus rhythm with ventricular response of 78 beats      per minute.   IMPRESSION:  1.  Borderline EEG.  2.  Dominant frequency is normal.  3.  Mild slowing in background, may be related to drowsiness.  It may also      relayed to drug effect or underlying encephalopathy.  4.  No postictal state.  5.  No seizures were seen.      Princess Bruins. Gaynell Face, M.D.  Electronically Signed     [id number]  D:  05/20/2005 09:31:51  T:  05/20/2005 13:36:06  Job #:  [id number]   cc:   Jill Alexanders, M.D.  Fax: [phone]   Donato Heinz, M.D.  Fax: [phone]

## 2010-08-09 NOTE — Op Note (Signed)
NAMEJAZIA, LUCCA             ACCOUNT NO.:  [id number]   MEDICAL RECORD NO.:  [id number]          PATIENT TYPE:  AMB   LOCATION:  SDS                          FACILITY:  [facility]   PHYSICIAN:  Rosetta Posner, M.D.    DATE OF BIRTH:  08/10/1955   DATE OF PROCEDURE:  07/04/2005  DATE OF DISCHARGE:                                 OPERATIVE REPORT   PREOPERATIVE DIAGNOSES:  End-stage renal disease.   POSTOPERATIVE DIAGNOSES:  End-stage renal disease.   PROCEDURE:  Creation of right Cimino AV fistula.   SURGEON:  Rosetta Posner, M.D.   ASSISTANT:  Leta Baptist, PA-C.   ANESTHESIA:  MAC.   COMPLICATIONS:  None.   DISPOSITION:  To recovery room stable.   DESCRIPTION OF PROCEDURE:  The patient was taken to the operating room and  placed in a position where the area of the right arm was prepped and draped  in the usual sterile fashion. Using local anesthesia, __________ between the  level of the cephalic vein and the radial artery __________. The patient had  a moderate size cephalic vein. The tributary branches of the cephalic vein  were ligated with 3-0 and 4-0 silk ties and divided. The vein was ligated  distally and mobilized to the level of the radial artery. The radial artery  was occluded proximally and distally with serrefine clamps, opened with an  11 blade, extended longitudinally with Potts scissors. The vein was cut to  the appropriate length and was spatulated and sewn end-to-side to the artery  with a running 6-0 Prolene suture. The clamp was removed and excellent  thrill was noted. The wound was irrigated with saline, hemostasis with  electrocautery. The wounds were closed with 3-0 Vicryl in the subcutaneous  and subcuticular tissues. Benzoin and Steri-Strips were applied.      Rosetta Posner, M.D.  Electronically Signed     TFE/MEDQ  D:  07/04/2005  T:  07/04/2005  Job:  [id number]

## 2010-09-24 ENCOUNTER — Other Ambulatory Visit: Payer: Self-pay | Admitting: Nephrology

## 2010-09-24 ENCOUNTER — Ambulatory Visit
Admission: RE | Admit: 2010-09-24 | Discharge: 2010-09-24 | Disposition: A | Discharge status: Home / Self Care | Payer: BC Managed Care – PPO | Source: Ambulatory Visit | Attending: Nephrology | Admitting: Nephrology

## 2010-09-24 DIAGNOSIS — R0602 Shortness of breath: Secondary | ICD-10-CM

## 2011-01-02 LAB — POTASSIUM: Potassium: 4.6

## 2011-01-06 LAB — POCT I-STAT 4, (NA,K, GLUC, HGB,HCT)
Glucose, Bld: 89
HCT: 44
Operator id: 181601
Potassium: 3.5
Potassium: 3.7
Sodium: 137

## 2011-01-07 LAB — POTASSIUM: Potassium: 4.8

## 2011-02-27 ENCOUNTER — Other Ambulatory Visit: Payer: Self-pay | Admitting: Nephrology

## 2011-02-27 DIAGNOSIS — T8619 Other complication of kidney transplant: Secondary | ICD-10-CM

## 2011-02-27 DIAGNOSIS — N23 Unspecified renal colic: Secondary | ICD-10-CM

## 2011-02-28 ENCOUNTER — Inpatient Hospital Stay
Admission: RE | Admit: 2011-02-28 | Discharge: 2011-02-28 | Payer: BC Managed Care – PPO | Source: Ambulatory Visit | Attending: Nephrology | Admitting: Nephrology

## 2011-03-03 ENCOUNTER — Ambulatory Visit
Admission: RE | Admit: 2011-03-03 | Discharge: 2011-03-03 | Disposition: A | Discharge status: Home / Self Care | Payer: BC Managed Care – PPO | Source: Ambulatory Visit | Attending: Nephrology | Admitting: Nephrology

## 2011-03-03 DIAGNOSIS — N23 Unspecified renal colic: Secondary | ICD-10-CM

## 2011-03-24 ENCOUNTER — Other Ambulatory Visit: Payer: Self-pay

## 2011-03-31 ENCOUNTER — Ambulatory Visit: Payer: BC Managed Care – PPO | Admitting: Gastroenterology

## 2011-06-03 ENCOUNTER — Encounter: Payer: Self-pay | Admitting: Cardiovascular Disease

## 2011-07-18 ENCOUNTER — Encounter: Payer: Self-pay | Admitting: *Deleted

## 2011-07-21 ENCOUNTER — Ambulatory Visit (INDEPENDENT_AMBULATORY_CARE_PROVIDER_SITE_OTHER): Payer: BC Managed Care – HMO | Admitting: Cardiovascular Disease

## 2011-07-21 ENCOUNTER — Encounter: Payer: Self-pay | Admitting: Cardiovascular Disease

## 2011-07-21 DIAGNOSIS — R0789 Other chest pain: Secondary | ICD-10-CM | POA: Insufficient documentation

## 2011-07-21 DIAGNOSIS — R079 Chest pain, unspecified: Secondary | ICD-10-CM

## 2011-07-21 NOTE — Assessment & Plan Note (Addendum)
[name] [name] presents today with some episodes of chest pain. Some aspects of her chest pain are very atypical but she does have a history of several cardiovascular risk factors including  renal failure, hypertension, and diabetes mellitus.  Her EKG is mildly abnormal with T-wave inversions in the lateral leads.  Output to schedule her for a [place] study for further evaluation. I'll see her again in 3 months for followup visit. If her Myoview study is normal then I will see her on an as-needed basis.

## 2011-07-21 NOTE — Patient Instructions (Signed)
Your physician has requested that you have a lexiscan myoview.  Please follow instruction sheet, as given. \ Your physician recommends that you schedule a follow-up appointment in: 3 months

## 2011-07-21 NOTE — Progress Notes (Signed)
Raeford Razor Date of Birth  03/17/1956 [place] [address]    [place]   [place], [place]  [address]    [place], [place]  [address] [phone]  Fax  [phone]  [phone]  Fax [phone]  Problems: 1. Chest pain 2. ESRD - s/p renal transplant ( July 2009) 3. HTN 4. Hyperlipidemia 5.   History of Present Illness:  [name] [name] is a 56 year old female with history of end-stage renal disease. She status post renal transplantation in 2009.  She presents today for further evaluation of some chest discomfort. The chest pain occurs at any time. It starts under her left breast. It seems to radiate up into her left shoulder and into her back.  The pain could last for as long as a couple of hours. It is not necessarily related to exercise.  It is also not related toeating, drinking, taking a deep breath.  She is starting a regular exercise program.  Current Outpatient Prescriptions on File Prior to Visit  Medication Sig Dispense Refill  . amLODipine (NORVASC) 10 MG tablet Take 10 mg by mouth daily.        [name] aspirin 325 MG tablet Take 325 mg by mouth daily.        [name] atenolol (TENORMIN) 50 MG tablet Take 50 mg by mouth 2 (two) times daily.      [name] estradiol (VIVELLE-DOT) 0.1 MG/24HR Place 1 patch onto the skin 2 (two) times a week.        . furosemide (LASIX) 40 MG tablet Take 40 mg by mouth 3 (three) times daily.      [name] glipiZIDE (GLUCOTROL) 5 MG tablet Take 5 mg by mouth daily.        [name] losartan (COZAAR) 100 MG tablet Take 100 mg by mouth 2 (two) times daily.      . Magnesium (ELITE MAGNESIUM) 100 MG TABS Take 1 tablet by mouth daily.        . Multiple Vitamin (MULTIVITAMIN) tablet Take 1 tablet by mouth daily.        . mycophenolate (CELLCEPT) 250 MG capsule Take 500 mg by mouth 2 (two) times daily.        [name] omeprazole (PRILOSEC) 40 MG capsule Take 40 mg by mouth daily.        . simvastatin (ZOCOR) 40 MG tablet Take 40 mg  by mouth at bedtime.      . Testosterone 10 MG/ACT (2%) GEL Place onto the skin daily.        Allergies  Allergen Reactions  . Diphenhydramine Hcl   . Lisinopril     Past Medical History  Diagnosis Date  . Chronic kidney disease   . Pneumonia   . Hypertension   . Hyperlipidemia   . Hemorrhoids   . Esophagitis     Grade 1 Distal  . Adenomatous colon polyp 04/1998  . Diabetes mellitus   . Seizures   . Metabolic acidosis   . Anemia   . Hyperkalemia   . Chronic renal failure     post transplant  . GERD (gastroesophageal reflux disease)     Past Surgical History  Procedure Date  . Abdominal hysterectomy   . Kidney transplant 2009    Both  . Cesarean section   . Av fistula placement 07/04/2005    Cimino AV fistula  . Dg av dialysis graft declot or 07/24/2005    AV Gore-Tex  graf  . Dg av dialysis graft declot or Thrombosis right forearm, loop arteriovenous    Thrombosis right forearm, loop arteriovenous graft  . Av fistula placement w/ ptfe 08/27/2005  . Av fistula placement 08/27/2005  . Thrombectomy / arteriovenous graft revision 10/12/2006  . Thrombectomy / arteriovenous graft revision 10/16/2006    History  Smoking status  . Never Smoker   Smokeless tobacco  . Not on file    History  Alcohol Use No    Family History  Problem Relation Age of Onset  . Kidney disease Paternal Aunt   . Heart disease Mother   . Heart disease Father     Reviw of Systems:  Reviewed in the HPI.  All other systems are negative.  Physical Exam: Blood pressure 148/94, pulse 49, height 5' 6.5" (1.689 m), weight 199 lb 1.9 oz (90.32 kg). General: Well developed, well nourished, in no acute distress.  Head: Normocephalic, atraumatic, sclera non-icteric, mucus membranes are moist,   Neck: Supple. Carotids are 2 + without bruits. No JVD  Lungs: Clear bilaterally to auscultation.  She has tenderness under her left breast.  Heart: regular rate.  normal  S1 S2. No murmurs,  gallops or rubs.  Abdomen: Soft, non-tender, non-distended with normal bowel sounds. No hepatomegaly. No rebound/guarding. No masses.  Msk:  Strength and tone are normal  Extremities: No clubbing or cyanosis. No edema.  Distal pedal pulses are 2+ and equal bilaterally.  Neuro: Alert and oriented X 3. Moves all extremities spontaneously.  Psych:  Responds to questions appropriately with a normal affect.  ECG: 07/21/2011--Sinus bradycardia. She has nonspecific T-wave abnormality in the lateral leads.  Assessment / Plan:

## 2011-07-31 ENCOUNTER — Ambulatory Visit (HOSPITAL_COMMUNITY): Payer: BC Managed Care – PPO | Attending: Cardiology | Admitting: Radiology

## 2011-07-31 DIAGNOSIS — R5381 Other malaise: Secondary | ICD-10-CM | POA: Insufficient documentation

## 2011-07-31 DIAGNOSIS — Z8249 Family history of ischemic heart disease and other diseases of the circulatory system: Secondary | ICD-10-CM | POA: Insufficient documentation

## 2011-07-31 DIAGNOSIS — E785 Hyperlipidemia, unspecified: Secondary | ICD-10-CM | POA: Insufficient documentation

## 2011-07-31 DIAGNOSIS — E119 Type 2 diabetes mellitus without complications: Secondary | ICD-10-CM | POA: Insufficient documentation

## 2011-07-31 DIAGNOSIS — R110 Nausea: Secondary | ICD-10-CM | POA: Insufficient documentation

## 2011-07-31 DIAGNOSIS — R079 Chest pain, unspecified: Secondary | ICD-10-CM

## 2011-07-31 DIAGNOSIS — R5383 Other fatigue: Secondary | ICD-10-CM | POA: Insufficient documentation

## 2011-07-31 DIAGNOSIS — R61 Generalized hyperhidrosis: Secondary | ICD-10-CM | POA: Insufficient documentation

## 2011-07-31 DIAGNOSIS — R9431 Abnormal electrocardiogram [ECG] [EKG]: Secondary | ICD-10-CM | POA: Insufficient documentation

## 2011-07-31 DIAGNOSIS — I10 Essential (primary) hypertension: Secondary | ICD-10-CM | POA: Insufficient documentation

## 2011-07-31 MED ORDER — TECHNETIUM TC 99M TETROFOSMIN IV KIT
10.0000 | PACK | Freq: Once | INTRAVENOUS | Status: AC | PRN
  Administered 2011-07-31: 10 via INTRAVENOUS

## 2011-07-31 MED ORDER — REGADENOSON 0.4 MG/5ML IV SOLN
0.4000 mg | Freq: Once | INTRAVENOUS | Status: AC
  Administered 2011-07-31: 0.4 mg via INTRAVENOUS

## 2011-07-31 MED ORDER — TECHNETIUM TC 99M TETROFOSMIN IV KIT
30.0000 | PACK | Freq: Once | INTRAVENOUS | Status: AC | PRN
  Administered 2011-07-31: 30 via INTRAVENOUS

## 2011-07-31 NOTE — Progress Notes (Signed)
[facility] NUCLEAR MED [place] [address] [phone]  Cardiology Nuclear Med Study  [name] [name] is a 56 y.o. female     MRN : [id number]     DOB: September 27, 1955  Procedure Date: 07/31/2011  Nuclear Med Background Indication for Stress Test:  Evaluation for Ischemia and Abnormal EKG History:  2008 MPS @ [facility] per patient, 2007 Echo- [facility] Cardiac Risk Factors: Family History - CAD, Hypertension, Lipids and NIDDM  Symptoms:  Chest Pain, Chest Tightness (last date of chest discomfort 1 week ago), Diaphoresis, Fatigue and Nausea   Nuclear Pre-Procedure Caffeine/Decaff Intake:  None NPO After: 8:30pm   Lungs:  clear O2 Sat: 99% on room air. IV 0.9% NS with Angio Cath:  22g  IV Site: L Forearm  IV Started by:  Eliezer Lofts, EMT-P  Chest Size (in):  48 Cup Size: C  Height: 5' 5.5" (1.664 m)  Weight:  197 lb (89.359 kg)  BMI:  Body mass index is 32.28 kg/(m^2). Tech Comments:  Atenolol held > 24 hours, per patient.    Nuclear Med Study 1 or 2 day study: 1 day  Stress Test Type:  Carlton Adam  Reading MD: Loralie Champagne, MD  Order Authorizing Provider:  Mertie Moores, MD  Resting Radionuclide: Technetium 51m Tetrofosmin  Resting Radionuclide Dose: 10.9 mCi   Stress Radionuclide:  Technetium 44m Tetrofosmin  Stress Radionuclide Dose: 33.0 mCi           Stress Protocol Rest HR: 56 Stress HR: 91  Rest BP: 131/70 Stress BP: 130/82  Exercise Time (min): n/a METS: n/a   Predicted Max HR: 167 bpm % Max HR: 54.49 bpm Rate Pressure Product: 12467   Dose of Adenosine (mg):  n/a Dose of Lexiscan: 0.4 mg  Dose of Atropine (mg): n/a Dose of Dobutamine: n/a mcg/kg/min (at max HR)  Stress Test Technologist: Crissie Figures, RN  Nuclear Technologist:  Charlton Amor, CNMT     Rest Procedure:  Myocardial perfusion imaging was performed at rest 45 minutes following the intravenous administration of Technetium 50m Tetrofosmin. Rest ECG:  Sinus Bradycardia with T wave abnormality  Stress Procedure:  The patient received IV Lexiscan 0.4 mg over 15-seconds with concurrent low level exercise and then Technetium 21m Tetrofosmin was injected at 30-seconds while the patient continued walking one more minute. She c/o CP (2/10) with injection, which resolved in recovery. There were no significant changes with Lexiscan. Quantitative spect images were obtained after a 45-minute delay. Stress ECG: No significant change from baseline ECG  QPS Raw Data Images:  There is a breast shadow that accounts for the anterior attenuation. Stress Images:  Normal homogeneous uptake in all areas of the myocardium. Rest Images:  Normal homogeneous uptake in all areas of the myocardium. Subtraction (SDS):  There is no evidence of scar or ischemia. Transient Ischemic Dilatation (Normal <1.22):  0.99 Lung/Heart Ratio (Normal <0.45):  0.28  Quantitative Gated Spect Images QGS EDV:  88 ml QGS ESV:  20 ml  Impression Exercise Capacity:  Lexiscan with low level exercise. BP Response:  Normal blood pressure response. Clinical Symptoms:  Lightheaded, chest pain.  ECG Impression:  No significant ST segment change suggestive of ischemia. Comparison with Prior Nuclear Study: No images to compare  Overall Impression:  Normal stress nuclear study.  LV Ejection Fraction: 77%.  LV Wall Motion:  NL LV Function; NL Wall Motion  [employer]

## 2011-08-04 ENCOUNTER — Ambulatory Visit (INDEPENDENT_AMBULATORY_CARE_PROVIDER_SITE_OTHER): Payer: BC Managed Care – PPO | Admitting: Gastroenterology

## 2011-08-04 ENCOUNTER — Encounter: Payer: Self-pay | Admitting: Gastroenterology

## 2011-08-04 VITALS — BP 134/72 | HR 58 | Ht 66.0 in | Wt 198.0 lb

## 2011-08-04 DIAGNOSIS — R079 Chest pain, unspecified: Secondary | ICD-10-CM

## 2011-08-04 DIAGNOSIS — R143 Flatulence: Secondary | ICD-10-CM

## 2011-08-04 DIAGNOSIS — K219 Gastro-esophageal reflux disease without esophagitis: Secondary | ICD-10-CM

## 2011-08-04 DIAGNOSIS — R141 Gas pain: Secondary | ICD-10-CM

## 2011-08-04 DIAGNOSIS — R140 Abdominal distension (gaseous): Secondary | ICD-10-CM

## 2011-08-04 DIAGNOSIS — K5900 Constipation, unspecified: Secondary | ICD-10-CM

## 2011-08-04 MED ORDER — ALIGN 4 MG PO CAPS: 1.0000 | ORAL_CAPSULE | Freq: Every day | ORAL | Status: DC

## 2011-08-04 NOTE — Progress Notes (Signed)
History of Present Illness: This is a 56 year old female who has had chronic problems with left costal margin pain exacerbated by movement and bending. She relates postprandial abdominal bloating and constipation for the past several months. She underwent upper endoscopy and colonoscopy in 2011 and both were unremarkable. She takes sorbitol frequently for constipation which is very effective. She has noticed more frequent breakthrough reflux symptoms in the past several weeks. She states she had blood work performed in Dr. Deterding's office recently that was unremarkable Denies weight loss, abdominal pain, constipation, diarrhea, change in stool caliber, melena, hematochezia, nausea, vomiting, dysphagia, reflux symptoms, chest pain.  Current Medications, Allergies, Past Medical History, Past Surgical History, Family History and Social History were reviewed in [employer] record.  Physical Exam: General: Well developed , well nourished, no acute distress Head: Normocephalic and atraumatic Eyes:  sclerae anicteric, EOMI Ears: Normal auditory acuity Mouth: No deformity or lesions Lungs: Clear throughout to auscultation, left lower chest and costal margin tenderness Heart: Regular rate and rhythm; no murmurs, rubs or bruits Abdomen: Soft, mild tenderness along left costal margin and non distended. No masses, hepatosplenomegaly or hernias noted. Normal Bowel sounds Musculoskeletal: Symmetrical with no gross deformities  Pulses:  Normal pulses noted Extremities: No clubbing, cyanosis, edema or deformities noted Neurological: Alert oriented x 4, grossly nonfocal Psychological:  Alert and cooperative. Normal mood and affect  Assessment and Recommendations:  1. Abdominal bloating and constipation. Increase dietary water and fiber intake. Increase daily magnesium dosage or take sorbitol on a regular schedule every other day to better control constipation. Begin a 2-3 week trial of  probiotics.  2. GERD. More frequent breakthrough symptoms. Continue omeprazole 40 mg daily and intensify antireflux measures. Consider increasing to twice daily if her symptoms are not adequately controlled.  3. Chronic left costal margin musculoskeletal pain.

## 2011-08-04 NOTE — Patient Instructions (Addendum)
We have given you samples of Align. This puts good bacteria back into your colon. You should take 1 capsule by mouth once daily. If this works well for you, it can be purchased over the counter. Increase taking your Sorbitol or magnesium for constipation. Patient advised to avoid spicy, acidic, citrus, chocolate, mints, fruit and fruit juices.  Limit the intake of caffeine, alcohol and Soda.  Don't exercise too soon after eating.  Don't lie down within 3-4 hours of eating.  Elevate the head of your bed.  cc: Mauricia Area, MD

## 2011-08-22 ENCOUNTER — Telehealth: Payer: Self-pay | Admitting: *Deleted

## 2011-08-22 NOTE — Telephone Encounter (Signed)
called pt with normal stress test results

## 2011-10-21 ENCOUNTER — Ambulatory Visit: Payer: Medicare Other | Admitting: Cardiovascular Disease

## 2011-11-13 ENCOUNTER — Ambulatory Visit (INDEPENDENT_AMBULATORY_CARE_PROVIDER_SITE_OTHER): Payer: BC Managed Care – PPO | Admitting: Cardiovascular Disease

## 2011-11-13 ENCOUNTER — Encounter: Payer: Self-pay | Admitting: Cardiovascular Disease

## 2011-11-13 VITALS — BP 147/91 | HR 54 | Ht 66.0 in | Wt 194.0 lb

## 2011-11-13 DIAGNOSIS — R0789 Other chest pain: Secondary | ICD-10-CM

## 2011-11-13 NOTE — Patient Instructions (Addendum)
Your physician recommends that you schedule a follow-up appointment in: AS NEEDED BASIS,

## 2011-11-13 NOTE — Assessment & Plan Note (Signed)
[name] [name] has had some problems with chest tightness. She had a stress Myoview study which is normal. She will continue to followup with her nephrologist.  Her blood pressure is mildly elevated. We'll have her followup with her nephrologist for these issues. I'll see her on an as-needed basis. I've asked her to continue with a good exercise program.

## 2011-11-13 NOTE — Progress Notes (Signed)
[name] [name] Date of Birth  June 07, 1955 [place] [address]    [place]   [place], [place]  [address]    [place], [place]  [address] [phone]  Fax  [phone]  [phone]  Fax [phone]  Problems: 1. Chest pain- negative stress myoview  2. ESRD - s/p bilateral renal transplant ( July 2009) 3. HTN 4. Hyperlipidemia 5.   History of Present Illness:  [name] [name] is a 56 year old female with history of end-stage renal disease. She status post renal transplantation in 2009.  She presents today for further evaluation of some chest discomfort. The chest pain occurs at any time. It starts under her left breast. It seems to radiate up into her left shoulder and into her back.  The pain could last for as long as a couple of hours. It is not necessarily related to exercise.  It is also not related toeating, drinking, taking a deep breath.  She is starting a regular exercise program.  She's feeling quite a bit better. She originally presented with some episodes of chest pain but her recent stress Myoview study was normal.  Current Outpatient Prescriptions on File Prior to Visit  Medication Sig Dispense Refill  . amLODipine (NORVASC) 10 MG tablet Take 10 mg by mouth daily.        [name] aspirin 325 MG tablet Take 325 mg by mouth daily.        [name] atenolol (TENORMIN) 50 MG tablet Take 50 mg by mouth 2 (two) times daily.      . calcitRIOL (ROCALTROL) 0.5 MCG capsule Take 0.5 mcg by mouth daily.      [name] estradiol (VIVELLE-DOT) 0.1 MG/24HR Place 1 patch onto the skin 2 (two) times a week.        . furosemide (LASIX) 40 MG tablet Take 40 mg by mouth 3 (three) times daily.      [name] glipiZIDE (GLUCOTROL) 5 MG tablet Take 5 mg by mouth daily.        [name] losartan (COZAAR) 100 MG tablet Take 100 mg by mouth 2 (two) times daily.      . Magnesium (ELITE MAGNESIUM) 100 MG TABS Take 1 tablet by mouth daily.        . Multiple Vitamin (MULTIVITAMIN)  tablet Take 1 tablet by mouth daily.        . mycophenolate (CELLCEPT) 250 MG capsule Take 500 mg by mouth 2 (two) times daily.        [name] omeprazole (PRILOSEC) 40 MG capsule Take 40 mg by mouth daily.        . prednisoLONE 5 MG TABS Take 5 mg by mouth daily.      . Probiotic Product (ALIGN) 4 MG CAPS Take 1 capsule by mouth daily.  14 capsule  0  . simvastatin (ZOCOR) 40 MG tablet Take 40 mg by mouth at bedtime.      . SORBITOL PO Take 1 tablet by mouth daily.      . tacrolimus (PROGRAF) 1 MG capsule Take seven tabs twice a day      . Testosterone 10 MG/ACT (2%) GEL Place onto the skin daily.        Allergies  Allergen Reactions  . Diphenhydramine Hcl   . Lisinopril     Past Medical History  Diagnosis Date  . Chronic kidney disease   . Pneumonia   . Hypertension   . Hyperlipidemia   .  Hemorrhoids   . Esophagitis     Grade 1 Distal  . Adenomatous colon polyp 04/1998  . Diabetes mellitus   . Seizures   . Metabolic acidosis   . Anemia   . Hyperkalemia   . Chronic renal failure     post transplant  . GERD (gastroesophageal reflux disease)     Past Surgical History  Procedure Date  . Abdominal hysterectomy   . Kidney transplant 2009    Both  . Cesarean section   . Av fistula placement 07/04/2005    Cimino AV fistula  . Dg av dialysis graft declot or 07/24/2005    AV Gore-Tex graf  . Dg av dialysis graft declot or Thrombosis right forearm, loop arteriovenous    Thrombosis right forearm, loop arteriovenous graft  . Av fistula placement w/ ptfe 08/27/2005  . Av fistula placement 08/27/2005  . Thrombectomy / arteriovenous graft revision 10/12/2006  . Thrombectomy / arteriovenous graft revision 10/16/2006    History  Smoking status  . Never Smoker   Smokeless tobacco  . Never Used    History  Alcohol Use No    Family History  Problem Relation Age of Onset  . Kidney disease Paternal Aunt   . Heart disease Mother   . Heart disease Father   . Colon cancer Neg  Hx     Reviw of Systems:  Reviewed in the HPI.  All other systems are negative.  Physical Exam: Blood pressure 147/91, pulse 54, height 5\' 6"  (1.676 m), weight 194 lb (87.998 kg). General: Well developed, well nourished, in no acute distress.  Head: Normocephalic, atraumatic, sclera non-icteric, mucus membranes are moist,   Neck: Supple. Carotids are 2 + without bruits. No JVD  Lungs: Clear bilaterally to auscultation.  She has tenderness under her left breast.  Heart: regular rate.  normal  S1 S2. No murmurs, gallops or rubs.  Abdomen: Soft, non-tender, non-distended with normal bowel sounds. No hepatomegaly. No rebound/guarding. No masses.  Msk:  Strength and tone are normal  Extremities: No clubbing or cyanosis. No edema.  Distal pedal pulses are 2+ and equal bilaterally.  Neuro: Alert and oriented X 3. Moves all extremities spontaneously.  Psych:  Responds to questions appropriately with a normal affect.  ECG: 07/21/2011--Sinus bradycardia. She has nonspecific T-wave abnormality in the lateral leads.  Assessment / Plan:

## 2013-04-19 DIAGNOSIS — N183 Chronic kidney disease, stage 3 unspecified: Secondary | ICD-10-CM | POA: Diagnosis not present

## 2013-04-19 DIAGNOSIS — E785 Hyperlipidemia, unspecified: Secondary | ICD-10-CM | POA: Diagnosis not present

## 2013-04-19 DIAGNOSIS — Z940 Kidney transplant status: Secondary | ICD-10-CM | POA: Diagnosis not present

## 2013-04-19 DIAGNOSIS — E118 Type 2 diabetes mellitus with unspecified complications: Secondary | ICD-10-CM | POA: Diagnosis not present

## 2013-04-19 DIAGNOSIS — N2581 Secondary hyperparathyroidism of renal origin: Secondary | ICD-10-CM | POA: Diagnosis not present

## 2013-04-19 DIAGNOSIS — I129 Hypertensive chronic kidney disease with stage 1 through stage 4 chronic kidney disease, or unspecified chronic kidney disease: Secondary | ICD-10-CM | POA: Diagnosis not present

## 2013-04-27 DIAGNOSIS — N183 Chronic kidney disease, stage 3 unspecified: Secondary | ICD-10-CM | POA: Diagnosis not present

## 2013-05-03 DIAGNOSIS — I129 Hypertensive chronic kidney disease with stage 1 through stage 4 chronic kidney disease, or unspecified chronic kidney disease: Secondary | ICD-10-CM | POA: Diagnosis not present

## 2013-05-13 ENCOUNTER — Ambulatory Visit: Payer: BC Managed Care – PPO | Admitting: Physician Assistant

## 2013-05-19 NOTE — Progress Notes (Deleted)
[place], [place], [place]  [address] Phone: [phone] Fax:  [phone]  Date:  05/19/2013   ID:  [name] [name], DOB Dec 10, 1955, MRN [id number]  PCP:  Placido Sou, MD  Cardiologist:  Dr. Liam Rogers     History of Present Illness: [name] [name] is a 58 y.o. female with a hx of ESRD s/p bilateral renal Tx in 09/2007, HTN, HL.  She was seen by Dr. Liam Rogers in 10/2011 for the evaluation of chest pain.  Lexiscan Myoview was low risk.  No further was workup was pursued.  She was to follow up PRN.  Echo (06/2005):  Mod LVH, normal LVF, trivial AI, mild RVH. Lexiscan Myoview (07/2011):  No scar or ischemia, EF 73%; Normal.    ***  Recent Labs: No results found for requested labs within last 365 days.  Wt Readings from Last 3 Encounters:  11/13/11 194 lb (87.998 kg)  08/04/11 198 lb (89.812 kg)  07/31/11 197 lb (89.359 kg)     Past Medical History  Diagnosis Date  . Chronic kidney disease   . Pneumonia   . Hypertension   . Hyperlipidemia   . Hemorrhoids   . Esophagitis     Grade 1 Distal  . Adenomatous colon polyp 04/1998  . Diabetes mellitus   . Seizures   . Metabolic acidosis   . Anemia   . Hyperkalemia   . Chronic renal failure     post transplant  . GERD (gastroesophageal reflux disease)     Current Outpatient Prescriptions  Medication Sig Dispense Refill  . amLODipine (NORVASC) 10 MG tablet Take 10 mg by mouth daily.        [name] aspirin 325 MG tablet Take 325 mg by mouth daily.        [name] atenolol (TENORMIN) 50 MG tablet Take 50 mg by mouth 2 (two) times daily.      . calcitRIOL (ROCALTROL) 0.5 MCG capsule Take 0.5 mcg by mouth daily.      [name] estradiol (VIVELLE-DOT) 0.1 MG/24HR Place 1 patch onto the skin 2 (two) times a week.        . furosemide (LASIX) 40 MG tablet Take 40 mg by mouth 3 (three) times daily.      [name] glipiZIDE (GLUCOTROL) 5 MG tablet Take 5 mg by mouth daily.        [name] losartan (COZAAR) 100 MG tablet Take 100 mg by mouth 2  (two) times daily.      . Magnesium (ELITE MAGNESIUM) 100 MG TABS Take 1 tablet by mouth daily.        . Multiple Vitamin (MULTIVITAMIN) tablet Take 1 tablet by mouth daily.        . mycophenolate (CELLCEPT) 250 MG capsule Take 500 mg by mouth 2 (two) times daily.        [name] omeprazole (PRILOSEC) 40 MG capsule Take 40 mg by mouth daily.        . prednisoLONE 5 MG TABS Take 5 mg by mouth daily.      . Probiotic Product (ALIGN) 4 MG CAPS Take 1 capsule by mouth daily.  14 capsule  0  . simvastatin (ZOCOR) 40 MG tablet Take 40 mg by mouth at bedtime.      . SORBITOL PO Take 1 tablet by mouth daily.      . tacrolimus (PROGRAF) 1 MG capsule Take seven tabs twice a day      . Testosterone 10 MG/ACT (2%) GEL  Place onto the skin daily.       No current facility-administered medications for this visit.    Allergies:   Diphenhydramine hcl and Lisinopril   Social History:  The patient  reports that she has never smoked. She has never used smokeless tobacco. She reports that she does not drink alcohol or use illicit drugs.   Family History:  The patient's family history includes Heart disease in her father and mother; Kidney disease in her paternal aunt. There is no history of Colon cancer.   ROS:  Please see the history of present illness.   ***   All other systems reviewed and negative.   PHYSICAL EXAM: VS:  There were no vitals taken for this visit. Well nourished, well developed, in no acute distress HEENT: normal Neck: no JVD Cardiac:  normal S1, S2; RRR; no murmur Lungs:  clear to auscultation bilaterally, no wheezing, rhonchi or rales Abd: soft, nontender, no hepatomegaly Ext: no edema Skin: warm and dry Neuro:  CNs 2-12 intact, no focal abnormalities noted  EKG:  ***     ASSESSMENT AND PLAN:  1. *** 2. Hypertension:  *** 3. Hyperlipidemia:  *** 4. ESRD, s/p bilateral Renal Tx:  *** 5. Disposition:  ***  Signed, Richardson Dopp, PA-C  05/19/2013 10:12 PM

## 2013-05-20 ENCOUNTER — Encounter: Payer: BC Managed Care – PPO | Admitting: Physician Assistant

## 2013-05-20 NOTE — Progress Notes (Signed)
This encounter was created in error - please disregard.

## 2013-05-23 ENCOUNTER — Telehealth: Payer: Self-pay | Admitting: Cardiovascular Disease

## 2013-05-23 NOTE — Telephone Encounter (Signed)
[email or web address]

## 2013-05-23 NOTE — Telephone Encounter (Signed)
New problem   Pt is have some Chest pressure and would like to be seen sooner than 3/9.  Please give pt a call back.

## 2013-05-24 ENCOUNTER — Telehealth: Payer: Self-pay | Admitting: Cardiovascular Disease

## 2013-05-24 DIAGNOSIS — I129 Hypertensive chronic kidney disease with stage 1 through stage 4 chronic kidney disease, or unspecified chronic kidney disease: Secondary | ICD-10-CM | POA: Diagnosis not present

## 2013-05-24 NOTE — Telephone Encounter (Signed)
New message ° ° ° ° ° ° ° ° ° °Pt returning nurses call °

## 2013-05-24 NOTE — Telephone Encounter (Signed)
Left MSG again from yesterday, she did make an app, asked her to call back if she has further need.

## 2013-05-24 NOTE — Telephone Encounter (Signed)
Pt made an app to be seen.

## 2013-05-27 DIAGNOSIS — Z940 Kidney transplant status: Secondary | ICD-10-CM | POA: Diagnosis not present

## 2013-05-30 ENCOUNTER — Ambulatory Visit: Payer: BC Managed Care – PPO | Admitting: Physician Assistant

## 2013-06-06 DIAGNOSIS — Z7982 Long term (current) use of aspirin: Secondary | ICD-10-CM | POA: Diagnosis not present

## 2013-06-06 DIAGNOSIS — Z79899 Other long term (current) drug therapy: Secondary | ICD-10-CM | POA: Diagnosis not present

## 2013-06-06 DIAGNOSIS — N390 Urinary tract infection, site not specified: Secondary | ICD-10-CM | POA: Diagnosis not present

## 2013-06-06 DIAGNOSIS — N186 End stage renal disease: Secondary | ICD-10-CM | POA: Diagnosis not present

## 2013-06-06 DIAGNOSIS — Z888 Allergy status to other drugs, medicaments and biological substances status: Secondary | ICD-10-CM | POA: Diagnosis not present

## 2013-06-06 DIAGNOSIS — Z940 Kidney transplant status: Secondary | ICD-10-CM | POA: Diagnosis not present

## 2013-06-06 DIAGNOSIS — K219 Gastro-esophageal reflux disease without esophagitis: Secondary | ICD-10-CM | POA: Diagnosis not present

## 2013-06-06 DIAGNOSIS — E669 Obesity, unspecified: Secondary | ICD-10-CM | POA: Diagnosis not present

## 2013-06-06 DIAGNOSIS — I129 Hypertensive chronic kidney disease with stage 1 through stage 4 chronic kidney disease, or unspecified chronic kidney disease: Secondary | ICD-10-CM | POA: Diagnosis not present

## 2013-06-06 DIAGNOSIS — D649 Anemia, unspecified: Secondary | ICD-10-CM | POA: Diagnosis not present

## 2013-06-06 DIAGNOSIS — E119 Type 2 diabetes mellitus without complications: Secondary | ICD-10-CM | POA: Diagnosis not present

## 2013-06-06 DIAGNOSIS — R944 Abnormal results of kidney function studies: Secondary | ICD-10-CM | POA: Diagnosis not present

## 2013-06-06 DIAGNOSIS — Z48298 Encounter for aftercare following other organ transplant: Secondary | ICD-10-CM | POA: Diagnosis not present

## 2013-06-06 DIAGNOSIS — R079 Chest pain, unspecified: Secondary | ICD-10-CM | POA: Diagnosis not present

## 2013-06-06 DIAGNOSIS — I10 Essential (primary) hypertension: Secondary | ICD-10-CM | POA: Diagnosis not present

## 2013-06-06 DIAGNOSIS — E139 Other specified diabetes mellitus without complications: Secondary | ICD-10-CM | POA: Diagnosis not present

## 2013-06-06 DIAGNOSIS — Z6830 Body mass index (BMI) 30.0-30.9, adult: Secondary | ICD-10-CM | POA: Diagnosis not present

## 2013-06-06 DIAGNOSIS — IMO0002 Reserved for concepts with insufficient information to code with codable children: Secondary | ICD-10-CM | POA: Diagnosis not present

## 2013-06-06 DIAGNOSIS — R799 Abnormal finding of blood chemistry, unspecified: Secondary | ICD-10-CM | POA: Diagnosis not present

## 2013-06-14 DIAGNOSIS — D899 Disorder involving the immune mechanism, unspecified: Secondary | ICD-10-CM | POA: Diagnosis not present

## 2013-06-14 DIAGNOSIS — E139 Other specified diabetes mellitus without complications: Secondary | ICD-10-CM | POA: Diagnosis not present

## 2013-06-14 DIAGNOSIS — I10 Essential (primary) hypertension: Secondary | ICD-10-CM | POA: Diagnosis not present

## 2013-06-14 DIAGNOSIS — N390 Urinary tract infection, site not specified: Secondary | ICD-10-CM | POA: Diagnosis not present

## 2013-06-14 DIAGNOSIS — Z940 Kidney transplant status: Secondary | ICD-10-CM | POA: Diagnosis not present

## 2013-06-16 ENCOUNTER — Ambulatory Visit (INDEPENDENT_AMBULATORY_CARE_PROVIDER_SITE_OTHER): Payer: BC Managed Care – PPO | Admitting: Physician Assistant

## 2013-06-16 ENCOUNTER — Encounter: Payer: Self-pay | Admitting: Physician Assistant

## 2013-06-16 VITALS — BP 160/94 | HR 58 | Ht 66.0 in | Wt 188.0 lb

## 2013-06-16 DIAGNOSIS — N186 End stage renal disease: Secondary | ICD-10-CM | POA: Diagnosis not present

## 2013-06-16 DIAGNOSIS — Z940 Kidney transplant status: Secondary | ICD-10-CM | POA: Diagnosis not present

## 2013-06-16 DIAGNOSIS — Z992 Dependence on renal dialysis: Secondary | ICD-10-CM

## 2013-06-16 DIAGNOSIS — E785 Hyperlipidemia, unspecified: Secondary | ICD-10-CM

## 2013-06-16 DIAGNOSIS — I10 Essential (primary) hypertension: Secondary | ICD-10-CM

## 2013-06-16 DIAGNOSIS — R079 Chest pain, unspecified: Secondary | ICD-10-CM | POA: Diagnosis not present

## 2013-06-16 MED ORDER — NITROGLYCERIN 0.4 MG SL SUBL: 0.4000 mg | SUBLINGUAL_TABLET | SUBLINGUAL | Status: DC | PRN

## 2013-06-16 MED ORDER — ISOSORBIDE MONONITRATE ER 30 MG PO TB24: 30.0000 mg | ORAL_TABLET | Freq: Every day | ORAL | Status: DC

## 2013-06-16 NOTE — Patient Instructions (Addendum)
START IMDUR 30 MG DAILY  AN RX WAS SENT IN TODAY FOR NITROGLYCERIN AND YOU HAVE BEEN ADVISED AS TO HOW AND WHEN TO USE NTG  Your physician has requested that you have a lexiscan myoview. For further information please visit [email or web address]. Please follow instruction sheet, as given.  Your physician has requested that you have an echocardiogram. Echocardiography is a painless test that uses sound waves to create images of your heart. It provides your doctor with information about the size and shape of your heart and how well your heart's chambers and valves are working. This procedure takes approximately one hour. There are no restrictions for this procedure.  PLEASE FOLLOW UP WITH SCOTT WEAVER, PAC IN ABOUT 2 WEEKS SAME DAY DR. Cathie Olden IS IN THE OFFICE; MAKE SURE YOUR TEST HAVE BEEN COMPLETED BEFORE APPT.

## 2013-06-16 NOTE — Progress Notes (Signed)
[place], [place],   [address] Phone: [phone] Fax:  [phone]  Date:  06/16/2013   ID:  [name] [name], DOB 06-Aug-1955, MRN [id number]  PCP:  Placido Sou, MD  Cardiologist:  Dr. Liam Rogers     History of Present Illness: [name] [name] is a 58 y.o. female With a history of ESRD status post renal transplant in 2009, HTN, HL, Diabetes, chest pain.  Patient was evaluated by Dr. Acie Fredrickson in 2013 for chest discomfort. Myoview study was normal.  Patient presents today for evaluation of chest pressure. This started about 3 weeks ago. It was much worse at that time. She did develop pain at rest. She can also develop pain with activity. She notes associated dyspnea. Several weeks ago, she had associated nausea as well as radiation in her left arm. Her symptoms have improved. She denies orthopnea, PND or edema. She denies syncope. Of note, she has been recently evaluated at [facility] due to rising creatinine. She does note that her most recent creatinine was 3.1. She had a recent biopsy that failed to demonstrate rejection of her graft. Her Prograf has been decreased.  Studies:  - Echo (07/16/05):  Moderate LVH, normal LV function, trivial AI, mild RVH  - Nuclear (07/31/11):  LexiScan, no scar or ischemia, EF 77%; normal   Recent Labs: No results found for requested labs within last 365 days.  Wt Readings from Last 3 Encounters:  06/16/13 188 lb (85.276 kg)  11/13/11 194 lb (87.998 kg)  08/04/11 198 lb (89.812 kg)     Past Medical History  Diagnosis Date  . Chronic kidney disease   . Pneumonia   . Hypertension   . Hyperlipidemia   . Hemorrhoids   . Esophagitis     Grade 1 Distal  . Adenomatous colon polyp 04/1998  . Diabetes mellitus   . Seizures   . Metabolic acidosis   . Anemia   . Hyperkalemia   . Chronic renal failure     post transplant  . GERD (gastroesophageal reflux disease)     Current Outpatient Prescriptions  Medication Sig Dispense  Refill  . amLODipine (NORVASC) 10 MG tablet Take 10 mg by mouth daily.        [name] aspirin 325 MG tablet Take 325 mg by mouth daily.        [name] atenolol (TENORMIN) 50 MG tablet Take 50 mg by mouth 2 (two) times daily.      . calcitRIOL (ROCALTROL) 0.5 MCG capsule Take 0.5 mcg by mouth daily.      . furosemide (LASIX) 40 MG tablet Take 40 mg by mouth 3 (three) times daily.      [name] glipiZIDE (GLUCOTROL) 5 MG tablet Take 5 mg by mouth daily.        [name] losartan (COZAAR) 100 MG tablet Take 100 mg by mouth 2 (two) times daily.      . Magnesium (ELITE MAGNESIUM) 100 MG TABS Take 1 tablet by mouth daily.        . Multiple Vitamin (MULTIVITAMIN) tablet Take 1 tablet by mouth daily.        . mycophenolate (CELLCEPT) 250 MG capsule Take 500 mg by mouth 2 (two) times daily.        [name] omeprazole (PRILOSEC) 40 MG capsule Take 40 mg by mouth daily.        . simvastatin (ZOCOR) 40 MG tablet Take 40 mg by mouth at bedtime.      [name]  tacrolimus (PROGRAF) 1 MG capsule Take seven tabs twice a day       No current facility-administered medications for this visit.    Allergies:   Diphenhydramine hcl and Lisinopril   Social History:  The patient  reports that she has never smoked. She has never used smokeless tobacco. She reports that she does not drink alcohol or use illicit drugs.   Family History:  The patient's family history includes Heart disease in her father and mother; Kidney disease in her paternal aunt. There is no history of Colon cancer.   ROS:  Please see the history of present illness.      All other systems reviewed and negative.   PHYSICAL EXAM: VS:  BP 160/94  Pulse 58  Ht 5\' 6"  (1.676 m)  Wt 188 lb (85.276 kg)  BMI 30.36 kg/m2 Well nourished, well developed, in no acute distress HEENT: normal Neck: no JVD Vascular: No carotid bruits Cardiac:  normal S1, S2; RRR; 1/6 systolic murmurAt the RUSB Lungs:  clear to auscultation bilaterally, no wheezing, rhonchi or rales Abd: soft, nontender, no  hepatomegaly Ext: no edema Skin: warm and dry Neuro:  CNs 2-12 intact, no focal abnormalities noted  EKG:  Sinus bradycardia, normal axis, T-wave inversions in 2, 3, aVF and V4-V6     ASSESSMENT AND PLAN:  1. Chest Pain: Patient presents with exertional chest discomfort. She essentially has CCS class III angina. She is at significant risk for CAD. She has family history as well as diabetes, hypertension, hyperlipidemia. However, she's had recent increases in her creatinine. Cardiac catheterization would likely result in progression to end-stage renal disease and hemodialysis. The patient is overall stable. She does have an abnormal ECG with inferolateral T-wave inversions. I reviewed her case today with Dr. Acie Fredrickson. We will proceed with risk assessment with Lexiscan Myoview. I will also obtain an echocardiogram. I will place her on isosorbide 30 mg daily. Her HR will not allow for up titration of her beta blocker. She is already on amlodipine. She will be brought back in close follow up.  I reviewed her case with Dr. Liam Rogers today who agreed with the plan. 2. Hypertension:  Blood pressure elevated. Add isosorbide as noted. 3. ESRD s/p Renal Transplant:  Continue followup with transplant physicians at [facility]. 4. Hyperlipidemia:  Continue statin. 5. Disposition:  F/u with Dr. Liam Rogers or me in 1-2 weeks.   Signed, Richardson Dopp, PA-C  06/16/2013 9:02 AM

## 2013-06-17 DIAGNOSIS — Z940 Kidney transplant status: Secondary | ICD-10-CM | POA: Diagnosis not present

## 2013-06-17 DIAGNOSIS — I129 Hypertensive chronic kidney disease with stage 1 through stage 4 chronic kidney disease, or unspecified chronic kidney disease: Secondary | ICD-10-CM | POA: Diagnosis not present

## 2013-06-17 DIAGNOSIS — N184 Chronic kidney disease, stage 4 (severe): Secondary | ICD-10-CM | POA: Diagnosis not present

## 2013-06-23 DIAGNOSIS — N039 Chronic nephritic syndrome with unspecified morphologic changes: Secondary | ICD-10-CM | POA: Diagnosis not present

## 2013-06-23 DIAGNOSIS — Z940 Kidney transplant status: Secondary | ICD-10-CM | POA: Diagnosis not present

## 2013-06-23 DIAGNOSIS — D631 Anemia in chronic kidney disease: Secondary | ICD-10-CM | POA: Diagnosis not present

## 2013-06-23 DIAGNOSIS — E785 Hyperlipidemia, unspecified: Secondary | ICD-10-CM | POA: Diagnosis not present

## 2013-06-23 DIAGNOSIS — N184 Chronic kidney disease, stage 4 (severe): Secondary | ICD-10-CM | POA: Diagnosis not present

## 2013-06-23 DIAGNOSIS — N2581 Secondary hyperparathyroidism of renal origin: Secondary | ICD-10-CM | POA: Diagnosis not present

## 2013-06-29 ENCOUNTER — Ambulatory Visit (HOSPITAL_COMMUNITY): Payer: BC Managed Care – PPO | Attending: Cardiovascular Disease | Admitting: Radiology

## 2013-06-29 VITALS — BP 117/72 | HR 57 | Ht 66.0 in | Wt 182.0 lb

## 2013-06-29 DIAGNOSIS — Z8249 Family history of ischemic heart disease and other diseases of the circulatory system: Secondary | ICD-10-CM | POA: Insufficient documentation

## 2013-06-29 DIAGNOSIS — I959 Hypotension, unspecified: Secondary | ICD-10-CM | POA: Insufficient documentation

## 2013-06-29 DIAGNOSIS — R42 Dizziness and giddiness: Secondary | ICD-10-CM | POA: Diagnosis not present

## 2013-06-29 DIAGNOSIS — R5383 Other fatigue: Secondary | ICD-10-CM | POA: Diagnosis not present

## 2013-06-29 DIAGNOSIS — N390 Urinary tract infection, site not specified: Secondary | ICD-10-CM | POA: Diagnosis not present

## 2013-06-29 DIAGNOSIS — R0789 Other chest pain: Secondary | ICD-10-CM | POA: Diagnosis not present

## 2013-06-29 DIAGNOSIS — R0989 Other specified symptoms and signs involving the circulatory and respiratory systems: Secondary | ICD-10-CM | POA: Insufficient documentation

## 2013-06-29 DIAGNOSIS — R0609 Other forms of dyspnea: Secondary | ICD-10-CM | POA: Diagnosis not present

## 2013-06-29 DIAGNOSIS — R0602 Shortness of breath: Secondary | ICD-10-CM | POA: Diagnosis not present

## 2013-06-29 DIAGNOSIS — E119 Type 2 diabetes mellitus without complications: Secondary | ICD-10-CM | POA: Diagnosis not present

## 2013-06-29 DIAGNOSIS — I10 Essential (primary) hypertension: Secondary | ICD-10-CM | POA: Diagnosis not present

## 2013-06-29 DIAGNOSIS — R079 Chest pain, unspecified: Secondary | ICD-10-CM

## 2013-06-29 DIAGNOSIS — R5381 Other malaise: Secondary | ICD-10-CM | POA: Diagnosis not present

## 2013-06-29 MED ORDER — REGADENOSON 0.4 MG/5ML IV SOLN
0.4000 mg | Freq: Once | INTRAVENOUS | Status: AC
  Administered 2013-06-29: 0.4 mg via INTRAVENOUS

## 2013-06-29 MED ORDER — TECHNETIUM TC 99M SESTAMIBI GENERIC - CARDIOLITE
30.0000 | Freq: Once | INTRAVENOUS | Status: AC | PRN
  Administered 2013-06-29: 30 via INTRAVENOUS

## 2013-06-29 MED ORDER — TECHNETIUM TC 99M SESTAMIBI GENERIC - CARDIOLITE
10.0000 | Freq: Once | INTRAVENOUS | Status: AC | PRN
  Administered 2013-06-29: 10 via INTRAVENOUS

## 2013-06-29 NOTE — Progress Notes (Signed)
[facility] [address] [place], [place] [address] [phone]    Cardiology Nuclear Med Study  [name] [name] is a 58 y.o. female     MRN : [id number]     DOB: [birth year]  Procedure Date: 06/29/2013  Nuclear Med Background Indication for Stress Test:  Evaluation for Ischemia and Abnormal EKG History:  No known CAD, Echo 2007 (mod LVH), MPI 2013 (normal) EF 77%, hx. seizures Cardiac Risk Factors: Family History - CAD, Hypertension, Lipids and NIDDM  Symptoms:  Chest Pressure.  (last date of chest discomfort was three weeks ago), Dizziness, DOE and SOB   Nuclear Pre-Procedure Caffeine/Decaff Intake:  None > 12 hrs NPO After: 6:30am   Lungs:  clear O2 Sat: 98% on room air. IV 0.9% NS with Angio Cath:  22g  IV Site: L Antecubital x 1, tolerated well IV Started by:  Irven Baltimore, RN  Chest Size (in):  42 Cup Size: C  Height: 5\' 6"  (1.676 m)  Weight:  182 lb (82.555 kg)  BMI:  Body mass index is 29.39 kg/(m^2). Tech Comments:  Held Lasix, Atenolol , and Glipizide this am. Irven Baltimore, RN    Nuclear Med Study 1 or 2 day study: 1 day  Stress Test Type:  Carlton Adam  Reading MD: N/A  Order Authorizing Provider:  Mertie Moores, MD, Richardson Dopp, PAC  Resting Radionuclide: Technetium 32m Sestamibi  Resting Radionuclide Dose: 11.0 mCi   Stress Radionuclide:  Technetium 20m Sestamibi  Stress Radionuclide Dose: 33.0 mCi           Stress Protocol Rest HR: 57 Stress HR: 78  Rest BP: 117/72 Stress BP: 101/75  Exercise Time (min): n/a METS: n/a           Dose of Adenosine (mg):  n/a Dose of Lexiscan: 0.4 mg  Dose of Atropine (mg): n/a Dose of Dobutamine: n/a mcg/kg/min (at max HR)  Stress Test Technologist: Glade Lloyd, BS-ES  Nuclear Technologist:  Charlton Amor, CNMT     Rest Procedure:  Myocardial perfusion imaging was performed at rest 45 minutes following the intravenous administration of Technetium 61m Sestamibi. Rest ECG: NSR with  nonspecific T wave abnormality  Stress Procedure:  The patient received IV Lexiscan 0.4 mg over 15-seconds.  Technetium 49m Sestamibi injected at 30-seconds.  Quantitative spect images were obtained after a 45 minute delay.  During the infusion of Lexiscan, the patient complained of flushing and fatigue.  These symptoms began to resolve in recovery.  Stress ECG: Nonspecific T wave abnormality  QPS Raw Data Images:  Mild breast attenuation.  Normal left ventricular size. Stress Images:  Normal homogeneous uptake in all areas of the myocardium. Rest Images:  There is decreased uptake in the apex. Subtraction (SDS):  No evidence of ischemia. Transient Ischemic Dilatation (Normal <1.22):  1.16 Lung/Heart Ratio (Normal <0.45):  0.32  Quantitative Gated Spect Images QGS EDV:  97 ml QGS ESV:  33 ml  Impression Exercise Capacity:  Lexiscan with no exercise. BP Response:  Hypotensive blood pressure response. Clinical Symptoms:  fatigue ECG Impression:  No significant ST segment change suggestive of ischemia. Comparison with Prior Nuclear Study: No significant change from previous study  Overall Impression:  Normal stress nuclear study.  LV Ejection Fraction: 66%.  LV Wall Motion:  NL LV Function; NL Wall Motion  Signed:  Fransico Him, MD

## 2013-06-30 ENCOUNTER — Encounter: Payer: Self-pay | Admitting: Physician Assistant

## 2013-06-30 DIAGNOSIS — Z940 Kidney transplant status: Secondary | ICD-10-CM | POA: Diagnosis not present

## 2013-06-30 DIAGNOSIS — Z48298 Encounter for aftercare following other organ transplant: Secondary | ICD-10-CM | POA: Diagnosis not present

## 2013-07-01 ENCOUNTER — Ambulatory Visit (HOSPITAL_COMMUNITY): Payer: BC Managed Care – PPO | Attending: Cardiology | Admitting: Cardiology

## 2013-07-01 DIAGNOSIS — R079 Chest pain, unspecified: Secondary | ICD-10-CM | POA: Diagnosis not present

## 2013-07-01 DIAGNOSIS — I10 Essential (primary) hypertension: Secondary | ICD-10-CM | POA: Insufficient documentation

## 2013-07-01 NOTE — Progress Notes (Signed)
Echo performed. 

## 2013-07-04 ENCOUNTER — Encounter: Payer: Self-pay | Admitting: Physician Assistant

## 2013-07-06 ENCOUNTER — Ambulatory Visit (INDEPENDENT_AMBULATORY_CARE_PROVIDER_SITE_OTHER): Payer: BC Managed Care – PPO | Admitting: Physician Assistant

## 2013-07-06 ENCOUNTER — Encounter: Payer: Self-pay | Admitting: Physician Assistant

## 2013-07-06 VITALS — BP 118/80 | HR 52 | Ht 66.0 in | Wt 184.0 lb

## 2013-07-06 DIAGNOSIS — N189 Chronic kidney disease, unspecified: Secondary | ICD-10-CM

## 2013-07-06 DIAGNOSIS — I7121 Aneurysm of the ascending aorta, without rupture: Secondary | ICD-10-CM

## 2013-07-06 DIAGNOSIS — I712 Thoracic aortic aneurysm, without rupture, unspecified: Secondary | ICD-10-CM | POA: Diagnosis not present

## 2013-07-06 DIAGNOSIS — I10 Essential (primary) hypertension: Secondary | ICD-10-CM

## 2013-07-06 DIAGNOSIS — E785 Hyperlipidemia, unspecified: Secondary | ICD-10-CM | POA: Diagnosis not present

## 2013-07-06 DIAGNOSIS — R0789 Other chest pain: Secondary | ICD-10-CM

## 2013-07-06 NOTE — Progress Notes (Signed)
[place], [place], [place]  [address] Phone: [phone] Fax:  [phone]  Date:  07/06/2013   ID:  [name] [name], DOB Mar 19, 1956, MRN [id number]  PCP:  Placido Sou, MD  Cardiologist:  Dr. Liam Rogers     History of Present Illness: [name] [name] is a 58 y.o. female with a history of ESRD status post renal transplant in 2009, HTN, HL, Diabetes, chest pain.  Patient was evaluated by Dr. Acie Fredrickson in 2013 for chest discomfort. Myoview study was normal.  I saw her 06/16/13. She complained of exertional chest discomfort consistent with CCS class III angina. Given her poor renal function in the setting of prior renal transplant, we opted for noninvasive evaluation and medical therapy.  Stress testing was performed and demonstrated no ischemia. Echocardiogram demonstrated normal LV function, mild diastolic dysfunction and a mildly dilated ascending aorta. I placed her on isosorbide. She returns today for follow up.  She is feeling much better. She denies any further chest discomfort or shortness of breath. She denies orthopnea, PND or edema. She denies syncope.   Studies:  - Echo (07/16/05):  Moderate LVH, normal LV function, trivial AI, mild RVH  - Echo (07/01/13):  Moderate focal basal hypertrophy, EF 60-65%, normal wall motion, grade 1 diastolic dysfunction, mild AI, dilated ascending aorta (41 mm), mild LAE.  - Nuclear (07/31/11):  LexiScan, no scar or ischemia, EF 77%; normal  - Nuclear (06/30/13):  No ischemia, EF 66%; NORMAL    Recent Labs: No results found for requested labs within last 365 days.  Wt Readings from Last 3 Encounters:  07/06/13 184 lb (83.462 kg)  06/29/13 182 lb (82.555 kg)  06/16/13 188 lb (85.276 kg)     Past Medical History  Diagnosis Date  . Chronic kidney disease   . Pneumonia   . Hypertension   . Hyperlipidemia   . Hemorrhoids   . Esophagitis     Grade 1 Distal  . Adenomatous colon polyp 04/1998  . Diabetes mellitus   .  Seizures   . Metabolic acidosis   . Anemia   . Hyperkalemia   . Chronic renal failure     post transplant  . GERD (gastroesophageal reflux disease)   . Hx of cardiovascular stress test     Lexiscan Myoview (06/2013):  No ischemia, EF 66%; normal.    . Hx of echocardiogram     Echocardiogram (06/2013):  Mod focal basal hypertrophy, EF 60-65%, normal wall motion, Gr 1 DD, mild AI, mildly dilated ascending aorta (41 mm), mild LAE.    Current Outpatient Prescriptions  Medication Sig Dispense Refill  . amLODipine (NORVASC) 10 MG tablet Take 10 mg by mouth daily.        [name] aspirin 325 MG tablet Take 325 mg by mouth daily.        [name] atenolol (TENORMIN) 50 MG tablet Take 50 mg by mouth 2 (two) times daily.      . calcitRIOL (ROCALTROL) 0.5 MCG capsule Take 0.5 mcg by mouth daily.      [name] CLEVER CHEK AUTO-CODE VOICE test strip       . estradiol (VIVELLE-DOT) 0.1 MG/24HR patch ONE PATCH  TWICE A WEEK      . furosemide (LASIX) 80 MG tablet Take 160 mg by mouth 2 (two) times daily.       [name] glipiZIDE (GLUCOTROL) 5 MG tablet Take 5 mg by mouth daily.        . isosorbide mononitrate (IMDUR)  30 MG 24 hr tablet Take 1 tablet (30 mg total) by mouth daily.  30 tablet  11  . losartan (COZAAR) 100 MG tablet Take 100 mg by mouth 2 (two) times daily.      . Magnesium (ELITE MAGNESIUM) 100 MG TABS Take 1 tablet by mouth daily.        . Multiple Vitamin (MULTIVITAMIN) tablet Take 1 tablet by mouth daily.        . mycophenolate (CELLCEPT) 250 MG capsule Take 500 mg by mouth 2 (two) times daily.        . nitroGLYCERIN (NITROSTAT) 0.4 MG SL tablet Place 1 tablet (0.4 mg total) under the tongue every 5 (five) minutes as needed for chest pain.  25 tablet  3  . omeprazole (PRILOSEC) 40 MG capsule Take 40 mg by mouth daily.        . simvastatin (ZOCOR) 40 MG tablet Take 40 mg by mouth at bedtime.      . tacrolimus (PROGRAF) 1 MG capsule Take 1 mg by mouth. Take FOUR  tabs twice a day       No current  facility-administered medications for this visit.    Allergies:   Diphenhydramine hcl and Lisinopril   Social History:  The patient  reports that she has never smoked. She has never used smokeless tobacco. She reports that she does not drink alcohol or use illicit drugs.   Family History:  The patient's family history includes Heart disease in her father and mother; Kidney disease in her paternal aunt. There is no history of Colon cancer.   ROS:  Please see the history of present illness.     All other systems reviewed and negative.   PHYSICAL EXAM: VS:  BP 118/80  Pulse 52  Ht 5\' 6"  (1.676 m)  Wt 184 lb (83.462 kg)  BMI 29.71 kg/m2 Well nourished, well developed, in no acute distress HEENT: normal Neck: no JVD Cardiac:  normal S1, S2; RRR; 1/6 systolic murmurAt the RUSB Lungs:  clear to auscultation bilaterally, no wheezing, rhonchi or rales Abd: soft, nontender, no hepatomegaly Ext: no edema Skin: warm and dry Neuro:  CNs 2-12 intact, no focal abnormalities noted   ASSESSMENT AND PLAN:  1. Chest Pain:  She is much improved on medical therapy. Stress test was low risk. Echocardiogram demonstrated normal LV function. The patient may have small vessel or branch vessel disease versus microvascular ischemia. In any event, she is stable without chest discomfort on nitrate therapy. Continue medical therapy. 2. Hypertension:  Better controlled. Continue current therapy. 3. ESRD s/p Renal Transplant:  She tells me that her recent creatinine was 3.4.  Continue followup with [facility] physicians at [facility]. 4. Hyperlipidemia:  Continue statin. 5. Thoracic Ascending Aortic Aneurysm:  Repeat echocardiogram in one year. 6. Disposition:  F/u with me or Dr. Acie Fredrickson in 3 months.Danton Sewer, PA-C  07/06/2013 10:39 AM

## 2013-07-06 NOTE — Patient Instructions (Addendum)
**Note De-Identified  Obfuscation** Your physician recommends that you continue on your current medications as directed. Please refer to the Current Medication list given to you today.  Your physician has requested that you have an echocardiogram. Echocardiography is a painless test that uses sound waves to create images of your heart. It provides your doctor with information about the size and shape of your heart and how well your heart's chambers and valves are working. This procedure takes approximately one hour. There are no restrictions for this procedure. This test is to be done is April 2016  Your physician recommends that you schedule a follow-up appointment in: 3 months with Richardson Dopp or [facility]

## 2013-07-15 DIAGNOSIS — N289 Disorder of kidney and ureter, unspecified: Secondary | ICD-10-CM | POA: Diagnosis not present

## 2013-07-15 DIAGNOSIS — E785 Hyperlipidemia, unspecified: Secondary | ICD-10-CM | POA: Diagnosis not present

## 2013-07-15 DIAGNOSIS — E119 Type 2 diabetes mellitus without complications: Secondary | ICD-10-CM | POA: Diagnosis not present

## 2013-07-15 DIAGNOSIS — Z940 Kidney transplant status: Secondary | ICD-10-CM | POA: Diagnosis not present

## 2013-07-15 DIAGNOSIS — D899 Disorder involving the immune mechanism, unspecified: Secondary | ICD-10-CM | POA: Diagnosis not present

## 2013-07-15 DIAGNOSIS — E139 Other specified diabetes mellitus without complications: Secondary | ICD-10-CM | POA: Diagnosis not present

## 2013-07-15 DIAGNOSIS — R634 Abnormal weight loss: Secondary | ICD-10-CM | POA: Diagnosis not present

## 2013-07-15 DIAGNOSIS — N200 Calculus of kidney: Secondary | ICD-10-CM | POA: Diagnosis not present

## 2013-07-15 DIAGNOSIS — N183 Chronic kidney disease, stage 3 unspecified: Secondary | ICD-10-CM | POA: Diagnosis not present

## 2013-07-15 DIAGNOSIS — Q619 Cystic kidney disease, unspecified: Secondary | ICD-10-CM | POA: Diagnosis not present

## 2013-07-15 DIAGNOSIS — Z48298 Encounter for aftercare following other organ transplant: Secondary | ICD-10-CM | POA: Diagnosis not present

## 2013-07-15 DIAGNOSIS — I129 Hypertensive chronic kidney disease with stage 1 through stage 4 chronic kidney disease, or unspecified chronic kidney disease: Secondary | ICD-10-CM | POA: Diagnosis not present

## 2013-07-15 DIAGNOSIS — IMO0002 Reserved for concepts with insufficient information to code with codable children: Secondary | ICD-10-CM | POA: Diagnosis not present

## 2013-07-15 DIAGNOSIS — I10 Essential (primary) hypertension: Secondary | ICD-10-CM | POA: Diagnosis not present

## 2013-08-09 DIAGNOSIS — N390 Urinary tract infection, site not specified: Secondary | ICD-10-CM | POA: Diagnosis not present

## 2013-09-26 DIAGNOSIS — E785 Hyperlipidemia, unspecified: Secondary | ICD-10-CM | POA: Diagnosis not present

## 2013-09-26 DIAGNOSIS — E118 Type 2 diabetes mellitus with unspecified complications: Secondary | ICD-10-CM | POA: Diagnosis not present

## 2013-09-26 DIAGNOSIS — Z940 Kidney transplant status: Secondary | ICD-10-CM | POA: Diagnosis not present

## 2013-09-26 DIAGNOSIS — N184 Chronic kidney disease, stage 4 (severe): Secondary | ICD-10-CM | POA: Diagnosis not present

## 2013-11-15 DIAGNOSIS — N184 Chronic kidney disease, stage 4 (severe): Secondary | ICD-10-CM | POA: Diagnosis not present

## 2013-11-15 DIAGNOSIS — D631 Anemia in chronic kidney disease: Secondary | ICD-10-CM | POA: Diagnosis not present

## 2013-12-26 DIAGNOSIS — N2581 Secondary hyperparathyroidism of renal origin: Secondary | ICD-10-CM | POA: Diagnosis not present

## 2013-12-26 DIAGNOSIS — N184 Chronic kidney disease, stage 4 (severe): Secondary | ICD-10-CM | POA: Diagnosis not present

## 2013-12-26 DIAGNOSIS — Z940 Kidney transplant status: Secondary | ICD-10-CM | POA: Diagnosis not present

## 2013-12-26 DIAGNOSIS — Z23 Encounter for immunization: Secondary | ICD-10-CM | POA: Diagnosis not present

## 2013-12-26 DIAGNOSIS — D631 Anemia in chronic kidney disease: Secondary | ICD-10-CM | POA: Diagnosis not present

## 2013-12-26 DIAGNOSIS — I129 Hypertensive chronic kidney disease with stage 1 through stage 4 chronic kidney disease, or unspecified chronic kidney disease: Secondary | ICD-10-CM | POA: Diagnosis not present

## 2014-02-02 DIAGNOSIS — N390 Urinary tract infection, site not specified: Secondary | ICD-10-CM | POA: Diagnosis not present

## 2014-02-02 DIAGNOSIS — N2581 Secondary hyperparathyroidism of renal origin: Secondary | ICD-10-CM | POA: Diagnosis not present

## 2014-02-02 DIAGNOSIS — N189 Chronic kidney disease, unspecified: Secondary | ICD-10-CM | POA: Diagnosis not present

## 2014-02-02 DIAGNOSIS — N184 Chronic kidney disease, stage 4 (severe): Secondary | ICD-10-CM | POA: Diagnosis not present

## 2014-02-02 DIAGNOSIS — Z940 Kidney transplant status: Secondary | ICD-10-CM | POA: Diagnosis not present

## 2014-02-08 DIAGNOSIS — N390 Urinary tract infection, site not specified: Secondary | ICD-10-CM | POA: Diagnosis not present

## 2014-03-21 ENCOUNTER — Ambulatory Visit (INDEPENDENT_AMBULATORY_CARE_PROVIDER_SITE_OTHER): Payer: Medicare Other

## 2014-03-21 DIAGNOSIS — M7661 Achilles tendinitis, right leg: Secondary | ICD-10-CM

## 2014-03-21 DIAGNOSIS — M722 Plantar fascial fibromatosis: Secondary | ICD-10-CM | POA: Diagnosis not present

## 2014-03-21 DIAGNOSIS — R52 Pain, unspecified: Secondary | ICD-10-CM | POA: Diagnosis not present

## 2014-03-21 DIAGNOSIS — M7731 Calcaneal spur, right foot: Secondary | ICD-10-CM

## 2014-03-21 MED ORDER — MELOXICAM 15 MG PO TABS: 15.0000 mg | ORAL_TABLET | Freq: Every day | ORAL | Status: DC

## 2014-03-21 MED ORDER — TRIAMCINOLONE ACETONIDE 10 MG/ML IJ SUSP: 10.0000 mg | Freq: Once | INTRAMUSCULAR | Status: DC

## 2014-03-21 NOTE — Progress Notes (Signed)
   Subjective:    Patient ID: [name] [name], female    DOB: [birth year], 58 y.o.   MRN: [id number]  HPI PT STATED BOTTOM AND BACK OF THE HEEL IS BEEN HURTING FOR 7 MONTHS. THE HEEL IS A LITTLE BETTER AND WHEN LAYING DOWN AND GET UP IS VERY PAINFUL TO PUT PRESSURE ON  IT. TRIED WEARING OTC INSERTS, TAKING NSAIDS BUT NO HELP.   Review of Systems  Musculoskeletal: Positive for gait problem.  All other systems reviewed and are negative.      Objective:   Physical Exam This is a 58 year old female well-developed well-nourished oriented 3 presents this time with a history of heel pain return in 6 months starting the inferior right heel pain on first up in the morning or getting up after a period of rest however is progressed on to the heel and calf area the Achilles tendon insertion site and Area become painful likely to compensatory gait changes. Objective findings reveal pedal pulses palpable DP +2 PT plus one over 4 Refill time 3 seconds all digits epicritic and proprioceptive sensation is intact and symmetric there is normal plantar response DTRs not listed not listed dermatologic the skin color pigment normal hair growth absent nails criptotic incurvated there is pain on palpation of the medial band plantar fascia medial calcaneal tubercle x-rays demonstrate well-developed inferior retrocalcaneal spurs mild fascial thickening noted on x-ray there is also some tenderness on palpation Achilles tendon insertion in the calf itself. Wounds no ulcers no secondary infections patient started wearing some power step orthotics that she got several years ago and has been helping although not completely resolved at this time.       Assessment & Plan:  Assessment plantar fasciitis/heel spur syndrome right with secondary Achilles tendinitis due to compensatory gait changes plan at this time injection tendons Kenalog 20 mg Xylocaine plain infiltrated to the inferior right heel and plantar fascial insertion  also fascial strapping applied to the right foot prescription for Modic is given 1 is needed for pain discontinue if any GI or cardiovascular irritations occur. Recheck in 2 weeks for follow-up maintain a good stable shoe or walking shoe at all times no barefoot no flimsy shoes or flip-flops maintain power step orthotics wherever possible fascial strapping applied patient noted almost immediate improvement following injection strapping application will reassess in 2 weeks  Harriet Masson DPM

## 2014-03-21 NOTE — Patient Instructions (Signed)

## 2014-03-28 DIAGNOSIS — N184 Chronic kidney disease, stage 4 (severe): Secondary | ICD-10-CM | POA: Diagnosis not present

## 2014-03-28 DIAGNOSIS — Z940 Kidney transplant status: Secondary | ICD-10-CM | POA: Diagnosis not present

## 2014-03-28 DIAGNOSIS — D631 Anemia in chronic kidney disease: Secondary | ICD-10-CM | POA: Diagnosis not present

## 2014-03-28 DIAGNOSIS — N2581 Secondary hyperparathyroidism of renal origin: Secondary | ICD-10-CM | POA: Diagnosis not present

## 2014-03-28 DIAGNOSIS — I129 Hypertensive chronic kidney disease with stage 1 through stage 4 chronic kidney disease, or unspecified chronic kidney disease: Secondary | ICD-10-CM | POA: Diagnosis not present

## 2014-03-28 DIAGNOSIS — E785 Hyperlipidemia, unspecified: Secondary | ICD-10-CM | POA: Diagnosis not present

## 2014-04-04 ENCOUNTER — Ambulatory Visit: Payer: Medicare Other

## 2014-05-24 ENCOUNTER — Encounter: Payer: Self-pay | Admitting: Gastroenterology

## 2014-06-21 ENCOUNTER — Other Ambulatory Visit: Payer: Self-pay | Admitting: Physician Assistant

## 2014-06-27 DIAGNOSIS — Z940 Kidney transplant status: Secondary | ICD-10-CM | POA: Diagnosis not present

## 2014-06-27 DIAGNOSIS — D631 Anemia in chronic kidney disease: Secondary | ICD-10-CM | POA: Diagnosis not present

## 2014-06-27 DIAGNOSIS — N184 Chronic kidney disease, stage 4 (severe): Secondary | ICD-10-CM | POA: Diagnosis not present

## 2014-06-27 DIAGNOSIS — I129 Hypertensive chronic kidney disease with stage 1 through stage 4 chronic kidney disease, or unspecified chronic kidney disease: Secondary | ICD-10-CM | POA: Diagnosis not present

## 2014-06-27 DIAGNOSIS — E118 Type 2 diabetes mellitus with unspecified complications: Secondary | ICD-10-CM | POA: Diagnosis not present

## 2014-07-10 DIAGNOSIS — Z940 Kidney transplant status: Secondary | ICD-10-CM | POA: Diagnosis not present

## 2014-07-19 ENCOUNTER — Telehealth: Payer: Self-pay | Admitting: Gastroenterology

## 2014-07-19 NOTE — Telephone Encounter (Signed)
Patient advised that her recall was incorrectly entered as a 5 year rather than a 10 year recall in 05/2009.  I explained that his recommendations on the letter mailed to her 05/24/2009 indicated a 10 year recall.  She is advised that of course if she has any changes or develops any GI symptoms to please call or return prior to 2021.  I explained that this recommendation is just for a screening procedure and not to wait if she has any symptoms.  She verbalized understanding and stated she was fine at this time.  She will call back for any additional questions or concerns.

## 2014-07-24 DIAGNOSIS — Z124 Encounter for screening for malignant neoplasm of cervix: Secondary | ICD-10-CM | POA: Diagnosis not present

## 2014-07-24 DIAGNOSIS — Z01419 Encounter for gynecological examination (general) (routine) without abnormal findings: Secondary | ICD-10-CM | POA: Diagnosis not present

## 2014-07-24 DIAGNOSIS — Z1231 Encounter for screening mammogram for malignant neoplasm of breast: Secondary | ICD-10-CM | POA: Diagnosis not present

## 2014-07-24 DIAGNOSIS — Z6832 Body mass index (BMI) 32.0-32.9, adult: Secondary | ICD-10-CM | POA: Diagnosis not present

## 2014-07-27 ENCOUNTER — Encounter: Payer: Self-pay | Admitting: Gastroenterology

## 2014-09-07 DIAGNOSIS — Z940 Kidney transplant status: Secondary | ICD-10-CM | POA: Diagnosis not present

## 2014-09-08 ENCOUNTER — Other Ambulatory Visit: Payer: Self-pay

## 2014-09-12 ENCOUNTER — Other Ambulatory Visit: Payer: Self-pay | Admitting: Cardiovascular Disease

## 2014-09-16 ENCOUNTER — Encounter (HOSPITAL_COMMUNITY): Payer: Self-pay | Admitting: *Deleted

## 2014-09-16 ENCOUNTER — Emergency Department (HOSPITAL_COMMUNITY)
Admission: EM | Admit: 2014-09-16 | Discharge: 2014-09-16 | Disposition: A | Discharge status: Home / Self Care | Payer: Medicare Other | Attending: Emergency Medicine | Admitting: Emergency Medicine

## 2014-09-16 DIAGNOSIS — Z79899 Other long term (current) drug therapy: Secondary | ICD-10-CM | POA: Diagnosis not present

## 2014-09-16 DIAGNOSIS — E785 Hyperlipidemia, unspecified: Secondary | ICD-10-CM | POA: Diagnosis not present

## 2014-09-16 DIAGNOSIS — R509 Fever, unspecified: Secondary | ICD-10-CM | POA: Insufficient documentation

## 2014-09-16 DIAGNOSIS — N189 Chronic kidney disease, unspecified: Secondary | ICD-10-CM | POA: Diagnosis not present

## 2014-09-16 DIAGNOSIS — Z8701 Personal history of pneumonia (recurrent): Secondary | ICD-10-CM | POA: Insufficient documentation

## 2014-09-16 DIAGNOSIS — R112 Nausea with vomiting, unspecified: Secondary | ICD-10-CM | POA: Diagnosis not present

## 2014-09-16 DIAGNOSIS — I129 Hypertensive chronic kidney disease with stage 1 through stage 4 chronic kidney disease, or unspecified chronic kidney disease: Secondary | ICD-10-CM | POA: Insufficient documentation

## 2014-09-16 DIAGNOSIS — K219 Gastro-esophageal reflux disease without esophagitis: Secondary | ICD-10-CM | POA: Diagnosis not present

## 2014-09-16 DIAGNOSIS — Z862 Personal history of diseases of the blood and blood-forming organs and certain disorders involving the immune mechanism: Secondary | ICD-10-CM | POA: Diagnosis not present

## 2014-09-16 DIAGNOSIS — Z86010 Personal history of colonic polyps: Secondary | ICD-10-CM | POA: Insufficient documentation

## 2014-09-16 DIAGNOSIS — Z7982 Long term (current) use of aspirin: Secondary | ICD-10-CM | POA: Diagnosis not present

## 2014-09-16 DIAGNOSIS — Z791 Long term (current) use of non-steroidal anti-inflammatories (NSAID): Secondary | ICD-10-CM | POA: Insufficient documentation

## 2014-09-16 DIAGNOSIS — E119 Type 2 diabetes mellitus without complications: Secondary | ICD-10-CM | POA: Diagnosis not present

## 2014-09-16 DIAGNOSIS — R197 Diarrhea, unspecified: Secondary | ICD-10-CM | POA: Diagnosis not present

## 2014-09-16 LAB — URINE MICROSCOPIC-ADD ON

## 2014-09-16 LAB — COMPREHENSIVE METABOLIC PANEL
ALBUMIN: 3.6 g/dL (ref 3.5–5.0)
ALT: 14 U/L (ref 14–54)
AST: 16 U/L (ref 15–41)
Alkaline Phosphatase: 101 U/L (ref 38–126)
Anion gap: 11 (ref 5–15)
BUN: 30 mg/dL — AB (ref 6–20)
CHLORIDE: 108 mmol/L (ref 101–111)
CO2: 18 mmol/L — AB (ref 22–32)
Calcium: 8.6 mg/dL — ABNORMAL LOW (ref 8.9–10.3)
Creatinine, Ser: 2.32 mg/dL — ABNORMAL HIGH (ref 0.44–1.00)
GFR calc Af Amer: 26 mL/min — ABNORMAL LOW (ref >60)
GFR, EST NON AFRICAN AMERICAN: 22 mL/min — AB (ref >60)
Glucose, Bld: 234 mg/dL — ABNORMAL HIGH (ref 65–99)
POTASSIUM: 3.5 mmol/L (ref 3.5–5.1)
SODIUM: 137 mmol/L (ref 135–145)
Total Bilirubin: 0.3 mg/dL (ref 0.3–1.2)
Total Protein: 7.2 g/dL (ref 6.5–8.1)

## 2014-09-16 LAB — CBC WITH DIFFERENTIAL/PLATELET
BASOS ABS: 0 10*3/uL (ref 0.0–0.1)
Basophils Relative: 0 % (ref 0–1)
Eosinophils Absolute: 0.1 10*3/uL (ref 0.0–0.7)
Eosinophils Relative: 1 % (ref 0–5)
HCT: 34.4 % — ABNORMAL LOW (ref 36.0–46.0)
Hemoglobin: 11.2 g/dL — ABNORMAL LOW (ref 12.0–15.0)
Lymphocytes Relative: 21 % (ref 12–46)
Lymphs Abs: 1.7 10*3/uL (ref 0.7–4.0)
MCH: 27.6 pg (ref 26.0–34.0)
MCHC: 32.6 g/dL (ref 30.0–36.0)
MCV: 84.7 fL (ref 78.0–100.0)
MONO ABS: 0.4 10*3/uL (ref 0.1–1.0)
Monocytes Relative: 5 % (ref 3–12)
Neutro Abs: 5.7 10*3/uL (ref 1.7–7.7)
Neutrophils Relative %: 73 % (ref 43–77)
Platelets: 252 10*3/uL (ref 150–400)
RBC: 4.06 MIL/uL (ref 3.87–5.11)
RDW: 14.4 % (ref 11.5–15.5)
WBC: 7.9 10*3/uL (ref 4.0–10.5)

## 2014-09-16 LAB — URINALYSIS, ROUTINE W REFLEX MICROSCOPIC
Bilirubin Urine: NEGATIVE
Glucose, UA: NEGATIVE mg/dL
HGB URINE DIPSTICK: NEGATIVE
Ketones, ur: NEGATIVE mg/dL
LEUKOCYTES UA: NEGATIVE
Nitrite: NEGATIVE
Protein, ur: 100 mg/dL — AB
Specific Gravity, Urine: 1.012 (ref 1.005–1.030)
UROBILINOGEN UA: 0.2 mg/dL (ref 0.0–1.0)
pH: 5 (ref 5.0–8.0)

## 2014-09-16 LAB — I-STAT CG4 LACTIC ACID, ED: Lactic Acid, Venous: 1.71 mmol/L (ref 0.5–2.0)

## 2014-09-16 LAB — LIPASE, BLOOD: Lipase: 28 U/L (ref 22–51)

## 2014-09-16 MED ORDER — ONDANSETRON HCL 4 MG/2ML IJ SOLN
4.0000 mg | Freq: Once | INTRAMUSCULAR | Status: AC
Start: 2014-09-16 — End: 2014-09-16
  Administered 2014-09-16: 4 mg via INTRAVENOUS
  Filled 2014-09-16: qty 2

## 2014-09-16 MED ORDER — SODIUM CHLORIDE 0.9 % IV BOLUS (SEPSIS)
500.0000 mL | Freq: Once | INTRAVENOUS | Status: AC
  Administered 2014-09-16: 500 mL via INTRAVENOUS

## 2014-09-16 MED ORDER — ONDANSETRON 8 MG PO TBDP
8.0000 mg | ORAL_TABLET | Freq: Three times a day (TID) | ORAL | Status: DC | PRN
Start: 2014-09-16 — End: 2015-10-03

## 2014-09-16 NOTE — ED Notes (Signed)
Pt reports hx of kidney transplant in 2009, having fever, n/v and diarrhea since Thursday. Had episodes of cramping pain to LUQ.

## 2014-09-16 NOTE — ED Provider Notes (Signed)
CSN: [id number]     Arrival date & time 09/16/14  1205 History   First MD Initiated Contact with Patient 09/16/14 1211     Chief Complaint  Patient presents with  . Fever  . Emesis  . Diarrhea     (Consider location/radiation/quality/duration/timing/severity/associated sxs/prior Treatment) Patient is a 59 y.o. female presenting with fever, vomiting, and diarrhea. The history is provided by the patient.  Fever Associated symptoms: diarrhea and vomiting   Associated symptoms: no chest pain, no headaches, no nausea and no rash   Emesis Associated symptoms: diarrhea   Associated symptoms: no abdominal pain and no headaches   Diarrhea Associated symptoms: fever and vomiting   Associated symptoms: no abdominal pain and no headaches    beginning 2 days ago patient had nausea vomiting diarrhea. Some mild abdominal pain. No known sick contacts. She's had a good appetite but states everything will come back up. She states the nausea vomiting has decreased somewhat but still having diarrhea. States she has had fevers but did not take a temperature. She has had a previous renal transplant has had no change in her urination. No blood in the stool or emesis  Past Medical History  Diagnosis Date  . Chronic kidney disease   . Pneumonia   . Hypertension   . Hyperlipidemia   . Hemorrhoids   . Esophagitis     Grade 1 Distal  . Adenomatous colon polyp 04/1998  . Diabetes mellitus   . Seizures   . Metabolic acidosis   . Anemia   . Hyperkalemia   . Chronic renal failure     post transplant  . GERD (gastroesophageal reflux disease)   . Hx of cardiovascular stress test     Lexiscan Myoview (06/2013):  No ischemia, EF 66%; normal.    . Hx of echocardiogram     Echocardiogram (06/2013):  Mod focal basal hypertrophy, EF 60-65%, normal wall motion, Gr 1 DD, mild AI, mildly dilated ascending aorta (41 mm), mild LAE.   Past Surgical History  Procedure Laterality Date  . Abdominal hysterectomy    .  Kidney transplant  2009    Both  . Cesarean section    . Av fistula placement  07/04/2005    Cimino AV fistula  . Dg av dialysis graft declot or  07/24/2005    AV Gore-Tex graf  . Dg av dialysis graft declot or  Thrombosis right forearm, loop arteriovenous    Thrombosis right forearm, loop arteriovenous graft  . Av fistula placement w/ ptfe  08/27/2005  . Av fistula placement  08/27/2005  . Thrombectomy / arteriovenous graft revision  10/12/2006  . Thrombectomy / arteriovenous graft revision  10/16/2006   Family History  Problem Relation Age of Onset  . Kidney disease Paternal Aunt   . Heart disease Mother   . Heart disease Father   . Colon cancer Neg Hx    History  Substance Use Topics  . Smoking status: Never Smoker   . Smokeless tobacco: Never Used  . Alcohol Use: No   OB History    No data available     Review of Systems  Constitutional: Positive for fever. Negative for activity change and appetite change.  Eyes: Negative for pain.  Respiratory: Negative for chest tightness and shortness of breath.   Cardiovascular: Negative for chest pain and leg swelling.  Gastrointestinal: Positive for vomiting and diarrhea. Negative for nausea and abdominal pain.  Genitourinary: Negative for flank pain.  Musculoskeletal: Negative for back pain  and neck stiffness.  Skin: Negative for rash.  Neurological: Negative for weakness, numbness and headaches.  Psychiatric/Behavioral: Negative for behavioral problems.      Allergies  Diphenhydramine hcl and Lisinopril  Home Medications   Prior to Admission medications   Medication Sig Start Date End Date Taking? Authorizing Provider  acetaminophen (TYLENOL) 500 MG tablet Take 1,000 mg by mouth every 6 (six) hours as needed for moderate pain.   Yes Historical Provider, MD  amLODipine (NORVASC) 10 MG tablet Take 10 mg by mouth daily.     Yes Historical Provider, MD  aspirin 325 MG tablet Take 325 mg by mouth daily.     Yes Historical  Provider, MD  atenolol (TENORMIN) 50 MG tablet Take 50 mg by mouth 2 (two) times daily.   Yes Historical Provider, MD  calcitRIOL (ROCALTROL) 0.5 MCG capsule Take 0.5 mcg by mouth daily.   Yes Historical Provider, MD  clonazePAM (KLONOPIN) 0.5 MG tablet Take 0.5 mg by mouth at bedtime as needed. For sleep 09/05/14  Yes Historical Provider, MD  estradiol (VIVELLE-DOT) 0.1 MG/24HR patch Place 1 patch onto the skin 2 (two) times a week. ONE PATCH  TWICE A WEEK 06/24/13  Yes Historical Provider, MD  furosemide (LASIX) 80 MG tablet Take 160 mg by mouth 2 (two) times daily.    Yes Historical Provider, MD  glipiZIDE (GLUCOTROL XL) 5 MG 24 hr tablet Take 5 mg by mouth 2 (two) times daily. 07/19/14  Yes Historical Provider, MD  isosorbide mononitrate (IMDUR) 30 MG 24 hr tablet TAKE 1 TABLET (30 MG TOTAL) BY MOUTH DAILY. 09/12/14  Yes Thayer Headings, MD  losartan (COZAAR) 100 MG tablet Take 100 mg by mouth 2 (two) times daily.   Yes Historical Provider, MD  Magnesium 250 MG TABS Take 500 mg by mouth daily.   Yes Historical Provider, MD  meloxicam (MOBIC) 15 MG tablet Take 1 tablet (15 mg total) by mouth daily. 03/21/14  Yes Harriet Masson, DPM  Multiple Vitamin (MULTIVITAMIN) tablet Take 1 tablet by mouth daily.     Yes Historical Provider, MD  mycophenolate (CELLCEPT) 250 MG capsule Take 750 mg by mouth 2 (two) times daily.    Yes Historical Provider, MD  nitroGLYCERIN (NITROSTAT) 0.4 MG SL tablet Place 1 tablet (0.4 mg total) under the tongue every 5 (five) minutes as needed for chest pain. 06/16/13  Yes Scott Joylene Draft, PA-C  omeprazole (PRILOSEC) 40 MG capsule Take 40 mg by mouth daily.     Yes Historical Provider, MD  predniSONE (DELTASONE) 5 MG tablet Take 5 mg by mouth every Monday, Wednesday, and Friday.   Yes Historical Provider, MD  simvastatin (ZOCOR) 40 MG tablet Take 40 mg by mouth at bedtime.   Yes Historical Provider, MD  tacrolimus (PROGRAF) 1 MG capsule Take 4-5 mg by mouth 2 (two) times daily.  Takes 5mg  in am and 4mg  in pm   Yes Historical Provider, MD  Magnesium (ELITE MAGNESIUM) 100 MG TABS Take 1 tablet by mouth daily.      Historical Provider, MD  ondansetron (ZOFRAN-ODT) 8 MG disintegrating tablet Take 1 tablet (8 mg total) by mouth every 8 (eight) hours as needed for nausea or vomiting. 09/16/14   Davonna Belling, MD   BP 161/88 mmHg  Pulse 62  Temp(Src) 98.4 F (36.9 C) (Oral)  Resp 20  Ht 5\' 5"  (1.651 m)  Wt 190 lb (86.183 kg)  BMI 31.62 kg/m2  SpO2 100% Physical Exam  Constitutional: She is oriented  to person, place, and time. She appears well-developed and well-nourished.  HENT:  Head: Normocephalic and atraumatic.  Eyes: EOM are normal. Pupils are equal, round, and reactive to light.  Neck: Normal range of motion. Neck supple.  Cardiovascular: Normal rate, regular rhythm and normal heart sounds.   No murmur heard. Pulmonary/Chest: Effort normal and breath sounds normal. No respiratory distress. She has no wheezes. She has no rales.  Abdominal: Soft. Bowel sounds are normal. She exhibits no distension. There is no tenderness. There is no rebound and no guarding.  Musculoskeletal: Normal range of motion.  Neurological: She is alert and oriented to person, place, and time. No cranial nerve deficit.  Skin: Skin is warm and dry.  Psychiatric: She has a normal mood and affect. Her speech is normal.  Nursing note and vitals reviewed.   ED Course  Procedures (including critical care time) Labs Review Labs Reviewed  COMPREHENSIVE METABOLIC PANEL - Abnormal; Notable for the following:    CO2 18 (*)    Glucose, Bld 234 (*)    BUN 30 (*)    Creatinine, Ser 2.32 (*)    Calcium 8.6 (*)    GFR calc non Af Amer 22 (*)    GFR calc Af Amer 26 (*)    All other components within normal limits  CBC WITH DIFFERENTIAL/PLATELET - Abnormal; Notable for the following:    Hemoglobin 11.2 (*)    HCT 34.4 (*)    All other components within normal limits  URINALYSIS, ROUTINE W  REFLEX MICROSCOPIC (NOT AT [facility]) - Abnormal; Notable for the following:    Protein, ur 100 (*)    All other components within normal limits  CULTURE, BLOOD (ROUTINE X 2)  CULTURE, BLOOD (ROUTINE X 2)  LIPASE, BLOOD  URINE MICROSCOPIC-ADD ON  I-STAT CG4 LACTIC ACID, ED    Imaging Review No results found.   EKG Interpretation None      MDM   Final diagnoses:  Nausea vomiting and diarrhea    Patient with nausea vomiting diarrhea. Likely self-limited illness. Bicarbonate is slightly decreased but creatinine is at baseline. She has tolerated orals here feels better after IV fluid. She'll be discharged home. Blood culture sent due to her immunosuppression for her kidney transplant.    Davonna Belling, MD 09/16/14 [phone]

## 2014-09-16 NOTE — ED Notes (Signed)
Pt given pillow.

## 2014-09-16 NOTE — Discharge Instructions (Signed)

## 2014-09-20 DIAGNOSIS — N184 Chronic kidney disease, stage 4 (severe): Secondary | ICD-10-CM | POA: Diagnosis not present

## 2014-09-20 DIAGNOSIS — I129 Hypertensive chronic kidney disease with stage 1 through stage 4 chronic kidney disease, or unspecified chronic kidney disease: Secondary | ICD-10-CM | POA: Diagnosis not present

## 2014-09-20 DIAGNOSIS — Z940 Kidney transplant status: Secondary | ICD-10-CM | POA: Diagnosis not present

## 2014-09-20 DIAGNOSIS — E118 Type 2 diabetes mellitus with unspecified complications: Secondary | ICD-10-CM | POA: Diagnosis not present

## 2014-09-21 LAB — CULTURE, BLOOD (ROUTINE X 2)
CULTURE: NO GROWTH
Culture: NO GROWTH

## 2014-10-10 ENCOUNTER — Other Ambulatory Visit: Payer: Self-pay | Admitting: Cardiovascular Disease

## 2014-10-11 DIAGNOSIS — I10 Essential (primary) hypertension: Secondary | ICD-10-CM | POA: Diagnosis not present

## 2014-10-11 DIAGNOSIS — F430 Acute stress reaction: Secondary | ICD-10-CM | POA: Diagnosis not present

## 2014-10-11 DIAGNOSIS — G4700 Insomnia, unspecified: Secondary | ICD-10-CM | POA: Diagnosis not present

## 2014-10-11 DIAGNOSIS — E669 Obesity, unspecified: Secondary | ICD-10-CM | POA: Diagnosis not present

## 2014-10-16 DIAGNOSIS — Z940 Kidney transplant status: Secondary | ICD-10-CM | POA: Diagnosis not present

## 2014-10-16 DIAGNOSIS — E782 Mixed hyperlipidemia: Secondary | ICD-10-CM | POA: Diagnosis not present

## 2014-10-16 DIAGNOSIS — E1129 Type 2 diabetes mellitus with other diabetic kidney complication: Secondary | ICD-10-CM | POA: Diagnosis not present

## 2014-10-16 DIAGNOSIS — N2581 Secondary hyperparathyroidism of renal origin: Secondary | ICD-10-CM | POA: Diagnosis not present

## 2014-10-16 DIAGNOSIS — I129 Hypertensive chronic kidney disease with stage 1 through stage 4 chronic kidney disease, or unspecified chronic kidney disease: Secondary | ICD-10-CM | POA: Diagnosis not present

## 2014-10-16 DIAGNOSIS — N184 Chronic kidney disease, stage 4 (severe): Secondary | ICD-10-CM | POA: Diagnosis not present

## 2014-10-18 DIAGNOSIS — Z940 Kidney transplant status: Secondary | ICD-10-CM | POA: Diagnosis not present

## 2014-10-23 LAB — HM MAMMOGRAPHY: HM MAMMO: NORMAL (ref 0–4)

## 2014-10-23 LAB — HM PAP SMEAR

## 2014-10-24 ENCOUNTER — Other Ambulatory Visit: Payer: Self-pay | Admitting: Cardiovascular Disease

## 2014-11-19 NOTE — Progress Notes (Signed)
Cardiology Office Note   Date:  11/20/2014   ID:  [name] [name], DOB October 02, 1955, MRN [id number]  PCP:  [name] Greenland, MD  Cardiologist:  Dr. Liam [name]   Electrophysiologist:  n/a  Chief Complaint  Patient presents with  . Follow-up    Chest Pain; ascending thoracic aortic aneurysm     History of Present Illness: [name] [name] is a 59 y.o. female with a hx of ESRD status post renal transplant in 2009, HTN, HL, Diabetes, chest pain. Patient was evaluated by Dr. Acie [name] in 2013 for chest discomfort. Myoview study was normal.  I saw her 06/16/13 with complaints of exertional chest discomfort consistent with CCS class III angina. Given her poor renal function in the setting of prior renal transplant, we opted for noninvasive evaluation and medical therapy. Stress testing was performed and demonstrated no ischemia. Echocardiogram demonstrated normal LV function, mild diastolic dysfunction and a mildly dilated ascending aorta. I placed her on isosorbide with improved symptoms.  Last seen in 4/15.   She returns for FU.  She is here alone.  Unfortunately her husband passed away this year.  She had some chest pressure when he died. Otherwise, she denies chest pain.  She continues to remain stable on Isosorbide.  She denies DOE, orthopnea, PND.  She has LE edema.  This is stable.  She denies syncope.     Studies/Reports Reviewed Today:  - Echo (07/16/05): Moderate LVH, normal LV function, trivial AI, mild RVH - Echo (07/01/13): Moderate focal basal hypertrophy, EF 60-65%, normal wall motion, grade 1 diastolic dysfunction, mild AI, dilated ascending aorta (41 mm), mild LAE. - Nuclear (07/31/11): LexiScan, no scar or ischemia, EF 77%; normal - Nuclear (06/30/13): No ischemia, EF 66%; NORMAL    Past Medical History  Diagnosis Date  . Chronic kidney disease   . Pneumonia   . Hypertension   . Hyperlipidemia   . Hemorrhoids   . Esophagitis     Grade 1 Distal  . Adenomatous  colon polyp 04/1998  . Diabetes mellitus   . Seizures   . Metabolic acidosis   . Anemia   . Hyperkalemia   . Chronic renal failure     post transplant  . GERD (gastroesophageal reflux disease)   . Hx of cardiovascular stress test     Lexiscan Myoview (06/2013):  No ischemia, EF 66%; normal.    . Hx of echocardiogram     Echocardiogram (06/2013):  Mod focal basal hypertrophy, EF 60-65%, normal wall motion, Gr 1 DD, mild AI, mildly dilated ascending aorta (41 mm), mild LAE.    Past Surgical History  Procedure Laterality Date  . Abdominal hysterectomy    . Kidney transplant  2009    Both  . Cesarean section    . Av fistula placement  07/04/2005    Cimino AV fistula  . Dg av dialysis graft declot or  07/24/2005    AV Gore-Tex graf  . Dg av dialysis graft declot or  Thrombosis right forearm, loop arteriovenous    Thrombosis right forearm, loop arteriovenous graft  . Av fistula placement w/ ptfe  08/27/2005  . Av fistula placement  08/27/2005  . Thrombectomy / arteriovenous graft revision  10/12/2006  . Thrombectomy / arteriovenous graft revision  10/16/2006     Current Outpatient Prescriptions  Medication Sig Dispense Refill  . acetaminophen (TYLENOL) 500 MG tablet Take 1,000 mg by mouth every 6 (six) hours as needed for moderate pain.    [name] amLODipine (NORVASC)  10 MG tablet Take 10 mg by mouth daily.      [name] aspirin 325 MG tablet Take 325 mg by mouth daily.      [name] atenolol (TENORMIN) 50 MG tablet Take 50 mg by mouth 2 (two) times daily.    . calcitRIOL (ROCALTROL) 0.5 MCG capsule Take 0.5 mcg by mouth daily.    [name] estradiol (VIVELLE-DOT) 0.1 MG/24HR patch Place 1 patch onto the skin 2 (two) times a week. ONE PATCH  TWICE A WEEK    . furosemide (LASIX) 80 MG tablet Take 160 mg by mouth 2 (two) times daily.     [name] glipiZIDE (GLUCOTROL XL) 5 MG 24 hr tablet Take 5 mg by mouth 2 (two) times daily.  3  . isosorbide mononitrate (IMDUR) 30 MG 24 hr tablet Take 1 tablet (30 mg total) by  mouth daily. 30 tablet 11  . losartan (COZAAR) 100 MG tablet Take 100 mg by mouth 2 (two) times daily.    . Magnesium (ELITE MAGNESIUM) 100 MG TABS Take 1 tablet by mouth daily.      . Magnesium 250 MG TABS Take 500 mg by mouth daily.    . meloxicam (MOBIC) 15 MG tablet Take 1 tablet (15 mg total) by mouth daily. 30 tablet 0  . Multiple Vitamin (MULTIVITAMIN) tablet Take 1 tablet by mouth daily.      . mycophenolate (CELLCEPT) 250 MG capsule Take 750 mg by mouth 2 (two) times daily.     . nitroGLYCERIN (NITROSTAT) 0.4 MG SL tablet Place 1 tablet (0.4 mg total) under the tongue every 5 (five) minutes as needed for chest pain. 25 tablet 3  . omeprazole (PRILOSEC) 40 MG capsule Take 40 mg by mouth daily.      . ondansetron (ZOFRAN-ODT) 8 MG disintegrating tablet Take 1 tablet (8 mg total) by mouth every 8 (eight) hours as needed for nausea or vomiting. 10 tablet 0  . predniSONE (DELTASONE) 5 MG tablet Take 5 mg by mouth every Monday, Wednesday, and Friday.    . simvastatin (ZOCOR) 40 MG tablet Take 20 mg by mouth at bedtime.    . tacrolimus (PROGRAF) 1 MG capsule Take 4-5 mg by mouth 2 (two) times daily. Takes 5mg  in am and 4mg  in pm    . temazepam (RESTORIL) 15 MG capsule Take 15 mg by mouth at bedtime.     Current Facility-Administered Medications  Medication Dose Route Frequency Provider Last Rate Last Dose  . triamcinolone acetonide (KENALOG) 10 MG/ML injection 10 mg  10 mg Other Once [name] [name], DPM        Allergies:   Diphenhydramine hcl and Lisinopril    Social History:  The patient  reports that she has never smoked. She has never used smokeless tobacco. She reports that she does not drink alcohol or use illicit drugs.   Family History:  The patient's family history includes Heart disease in her father and mother; Kidney disease in her paternal aunt. There is no history of Colon cancer.    ROS:   Please see the history of present illness.   Review of Systems  Cardiovascular:  Positive for chest pain and leg swelling.  All other systems reviewed and are negative.     PHYSICAL EXAM: VS:  BP 122/70 mmHg  Pulse 66  Ht 5\' 5"  (1.651 m)  Wt 188 lb (85.276 kg)  BMI 31.28 kg/m2    Wt Readings from Last 3 Encounters:  11/20/14 188 lb (85.276 kg)  09/16/14  190 lb (86.183 kg)  07/06/13 184 lb (83.462 kg)     GEN: Well nourished, well developed, in no acute distress HEENT: normal Neck: no JVD,  no masses Cardiac:  Normal 99991111, RRR; 1/6 systolic murmur RUSB,  no rubs or gallops, trace bilateral LE edema   Respiratory:  clear to auscultation bilaterally, no wheezing, rhonchi or rales. GI: soft, nontender, nondistended, + BS MS: no deformity or atrophy Skin: warm and dry  Neuro:  CNs II-XII intact, Strength and sensation are intact Psych: Normal affect   EKG:  EKG is ordered today.  It demonstrates:    NSR, HR 66, LAD , Q waves in V1, V2 , nonspecific ST-T wave changes , QTc 425, no significant change when compared to prior tracings   Recent Labs: 09/16/2014: ALT 14; Hemoglobin 11.2*; Platelets 252 11/20/2014: BUN 31*; Creatinine, Ser 2.36*; Potassium 3.6; Sodium 141    Lipid Panel No results found for: CHOL, TRIG, HDL, CHOLHDL, VLDL, LDLCALC, LDLDIRECT    ASSESSMENT AND PLAN:  Precordial pain:   Overall, her symptoms have remained stable on nitrate therapy. As noted, stress testing last year was low risk. Continue current therapy.  ESRD (end stage renal disease) s/p renal transplant:   Follow-up with nephrology as planned. She has had some leg cramps recently. She takes a fairly large dose of Lasix. I will obtain a BMET today.  Essential hypertension, benign:   Controlled.  HLD (hyperlipidemia):   Maximum dose of simvastatin in conjunction with amlodipine should be 20 mg. I will decrease her simvastatin to 20 mg daily. She can follow up with her primary care physician for follow-up laboratory testing as indicated.  Thoracic ascending aortic aneurysm  (41 mm on Echo 06/2013) - Plan: ECHOCARDIOGRAM COMPLETE     Medication Changes: Current medicines are reviewed at length with the patient today.  Concerns regarding medicines are as outlined above.  The following changes have been made:   Discontinued Medications   CLONAZEPAM (KLONOPIN) 0.5 MG TABLET    Take 0.5 mg by mouth at bedtime as needed. For sleep   Modified Medications   Modified Medication Previous Medication   ISOSORBIDE MONONITRATE (IMDUR) 30 MG 24 HR TABLET isosorbide mononitrate (IMDUR) 30 MG 24 hr tablet      Take 1 tablet (30 mg total) by mouth daily.    TAKE 1 TABLET BY MOUTH EVERY DAY   New Prescriptions   No medications on file    Labs/ tests ordered today include:   Orders Placed This Encounter  Procedures  . Basic metabolic panel  . EKG 12-Lead  . ECHOCARDIOGRAM COMPLETE      Disposition:    FU with  Dr. Acie [name] or me in one year.   Signed, Versie Starks, MHS 11/20/2014 5:10 PM    [facility] [address], [place], [place]  [address] Phone: [phone]; Fax: [phone]

## 2014-11-20 ENCOUNTER — Encounter: Payer: Self-pay | Admitting: Physician Assistant

## 2014-11-20 ENCOUNTER — Ambulatory Visit (INDEPENDENT_AMBULATORY_CARE_PROVIDER_SITE_OTHER): Payer: Medicare Other | Admitting: Physician Assistant

## 2014-11-20 VITALS — BP 122/70 | HR 66 | Ht 65.0 in | Wt 188.0 lb

## 2014-11-20 DIAGNOSIS — R072 Precordial pain: Secondary | ICD-10-CM | POA: Diagnosis not present

## 2014-11-20 DIAGNOSIS — Z940 Kidney transplant status: Secondary | ICD-10-CM

## 2014-11-20 DIAGNOSIS — E785 Hyperlipidemia, unspecified: Secondary | ICD-10-CM | POA: Diagnosis not present

## 2014-11-20 DIAGNOSIS — I712 Thoracic aortic aneurysm, without rupture: Secondary | ICD-10-CM

## 2014-11-20 DIAGNOSIS — N186 End stage renal disease: Secondary | ICD-10-CM | POA: Diagnosis not present

## 2014-11-20 DIAGNOSIS — I7121 Aneurysm of the ascending aorta, without rupture: Secondary | ICD-10-CM

## 2014-11-20 DIAGNOSIS — I10 Essential (primary) hypertension: Secondary | ICD-10-CM

## 2014-11-20 LAB — BASIC METABOLIC PANEL
BUN: 31 mg/dL — ABNORMAL HIGH (ref 6–23)
CALCIUM: 9.2 mg/dL (ref 8.4–10.5)
CHLORIDE: 106 meq/L (ref 96–112)
CO2: 25 meq/L (ref 19–32)
Creatinine, Ser: 2.36 mg/dL — ABNORMAL HIGH (ref 0.40–1.20)
GFR: 27.31 mL/min — ABNORMAL LOW (ref >60.00)
GLUCOSE: 207 mg/dL — AB (ref 70–99)
Potassium: 3.6 mEq/L (ref 3.5–5.1)
SODIUM: 141 meq/L (ref 135–145)

## 2014-11-20 MED ORDER — ISOSORBIDE MONONITRATE ER 30 MG PO TB24: 30.0000 mg | ORAL_TABLET | Freq: Every day | ORAL | Status: DC

## 2014-11-20 NOTE — Patient Instructions (Addendum)
Medication Instructions:  A REFILL FOR IMDUR WAS SENT IN TODAY  DECREASE SIMVASTATIN TO 20 MG DAILY  Labwork: TODAY BMET  Testing/Procedures: Your physician has requested that you have an echocardiogram. Echocardiography is a painless test that uses sound waves to create images of your heart. It provides your doctor with information about the size and shape of your heart and how well your heart's chambers and valves are working. This procedure takes approximately one hour. There are no restrictions for this procedure.    Follow-Up: Your physician wants you to follow-up in: [place] Acie Fredrickson You will receive a reminder letter in the mail two months in advance. If you don't receive a letter, please call our office to schedule the follow-up appointment.   Any Other Special Instructions Will Be Listed Below (If Applicable).

## 2014-11-21 ENCOUNTER — Telehealth: Payer: Self-pay | Admitting: Physician Assistant

## 2014-11-21 NOTE — Telephone Encounter (Signed)
Pt notified of lab results by phone with verbal understanding.  

## 2014-11-21 NOTE — Telephone Encounter (Signed)
New message     Patient returning call back to nurse regarding test results.

## 2014-11-29 DIAGNOSIS — Z8719 Personal history of other diseases of the digestive system: Secondary | ICD-10-CM | POA: Diagnosis not present

## 2014-11-29 DIAGNOSIS — I10 Essential (primary) hypertension: Secondary | ICD-10-CM | POA: Diagnosis not present

## 2014-11-29 DIAGNOSIS — Z4822 Encounter for aftercare following kidney transplant: Secondary | ICD-10-CM | POA: Diagnosis not present

## 2014-11-29 DIAGNOSIS — D8989 Other specified disorders involving the immune mechanism, not elsewhere classified: Secondary | ICD-10-CM | POA: Diagnosis not present

## 2014-11-29 DIAGNOSIS — E139 Other specified diabetes mellitus without complications: Secondary | ICD-10-CM | POA: Diagnosis not present

## 2014-11-29 DIAGNOSIS — E669 Obesity, unspecified: Secondary | ICD-10-CM | POA: Diagnosis not present

## 2014-11-29 DIAGNOSIS — Z886 Allergy status to analgesic agent status: Secondary | ICD-10-CM | POA: Diagnosis not present

## 2014-11-29 DIAGNOSIS — Z7952 Long term (current) use of systemic steroids: Secondary | ICD-10-CM | POA: Diagnosis not present

## 2014-11-29 DIAGNOSIS — Z888 Allergy status to other drugs, medicaments and biological substances status: Secondary | ICD-10-CM | POA: Diagnosis not present

## 2014-11-29 DIAGNOSIS — I158 Other secondary hypertension: Secondary | ICD-10-CM | POA: Diagnosis not present

## 2014-11-29 DIAGNOSIS — Z6830 Body mass index (BMI) 30.0-30.9, adult: Secondary | ICD-10-CM | POA: Diagnosis not present

## 2014-11-29 DIAGNOSIS — E785 Hyperlipidemia, unspecified: Secondary | ICD-10-CM | POA: Diagnosis not present

## 2014-11-29 DIAGNOSIS — Z79899 Other long term (current) drug therapy: Secondary | ICD-10-CM | POA: Diagnosis not present

## 2014-11-29 DIAGNOSIS — I129 Hypertensive chronic kidney disease with stage 1 through stage 4 chronic kidney disease, or unspecified chronic kidney disease: Secondary | ICD-10-CM | POA: Diagnosis not present

## 2014-11-29 DIAGNOSIS — E119 Type 2 diabetes mellitus without complications: Secondary | ICD-10-CM | POA: Diagnosis not present

## 2014-11-29 DIAGNOSIS — K219 Gastro-esophageal reflux disease without esophagitis: Secondary | ICD-10-CM | POA: Diagnosis not present

## 2014-11-29 DIAGNOSIS — N390 Urinary tract infection, site not specified: Secondary | ICD-10-CM | POA: Diagnosis not present

## 2014-11-29 DIAGNOSIS — Z5181 Encounter for therapeutic drug level monitoring: Secondary | ICD-10-CM | POA: Diagnosis not present

## 2014-11-29 DIAGNOSIS — Z7982 Long term (current) use of aspirin: Secondary | ICD-10-CM | POA: Diagnosis not present

## 2014-11-29 DIAGNOSIS — Z940 Kidney transplant status: Secondary | ICD-10-CM | POA: Diagnosis not present

## 2014-11-29 DIAGNOSIS — D649 Anemia, unspecified: Secondary | ICD-10-CM | POA: Diagnosis not present

## 2014-11-29 DIAGNOSIS — N183 Chronic kidney disease, stage 3 (moderate): Secondary | ICD-10-CM | POA: Diagnosis not present

## 2014-12-04 ENCOUNTER — Other Ambulatory Visit (HOSPITAL_COMMUNITY): Payer: Medicare Other

## 2014-12-07 ENCOUNTER — Other Ambulatory Visit (HOSPITAL_COMMUNITY): Payer: Medicare Other

## 2014-12-12 ENCOUNTER — Other Ambulatory Visit: Payer: Self-pay

## 2014-12-12 ENCOUNTER — Ambulatory Visit (HOSPITAL_COMMUNITY): Payer: Medicare Other | Attending: Physician Assistant

## 2014-12-12 ENCOUNTER — Encounter: Payer: Self-pay | Admitting: Physician Assistant

## 2014-12-12 DIAGNOSIS — I371 Nonrheumatic pulmonary valve insufficiency: Secondary | ICD-10-CM | POA: Diagnosis not present

## 2014-12-12 DIAGNOSIS — Z8249 Family history of ischemic heart disease and other diseases of the circulatory system: Secondary | ICD-10-CM | POA: Insufficient documentation

## 2014-12-12 DIAGNOSIS — I340 Nonrheumatic mitral (valve) insufficiency: Secondary | ICD-10-CM | POA: Insufficient documentation

## 2014-12-12 DIAGNOSIS — I351 Nonrheumatic aortic (valve) insufficiency: Secondary | ICD-10-CM | POA: Diagnosis not present

## 2014-12-12 DIAGNOSIS — I77810 Thoracic aortic ectasia: Secondary | ICD-10-CM | POA: Diagnosis not present

## 2014-12-12 DIAGNOSIS — I712 Thoracic aortic aneurysm, without rupture: Secondary | ICD-10-CM | POA: Insufficient documentation

## 2014-12-12 DIAGNOSIS — I10 Essential (primary) hypertension: Secondary | ICD-10-CM | POA: Diagnosis not present

## 2014-12-12 DIAGNOSIS — I071 Rheumatic tricuspid insufficiency: Secondary | ICD-10-CM | POA: Insufficient documentation

## 2014-12-12 DIAGNOSIS — E119 Type 2 diabetes mellitus without complications: Secondary | ICD-10-CM | POA: Insufficient documentation

## 2014-12-12 DIAGNOSIS — I7121 Aneurysm of the ascending aorta, without rupture: Secondary | ICD-10-CM

## 2014-12-12 DIAGNOSIS — I517 Cardiomegaly: Secondary | ICD-10-CM | POA: Insufficient documentation

## 2014-12-12 DIAGNOSIS — I5189 Other ill-defined heart diseases: Secondary | ICD-10-CM | POA: Diagnosis not present

## 2014-12-12 DIAGNOSIS — E785 Hyperlipidemia, unspecified: Secondary | ICD-10-CM | POA: Diagnosis not present

## 2015-01-01 DIAGNOSIS — N958 Other specified menopausal and perimenopausal disorders: Secondary | ICD-10-CM | POA: Diagnosis not present

## 2015-01-01 DIAGNOSIS — Z7952 Long term (current) use of systemic steroids: Secondary | ICD-10-CM | POA: Diagnosis not present

## 2015-01-01 DIAGNOSIS — M8588 Other specified disorders of bone density and structure, other site: Secondary | ICD-10-CM | POA: Diagnosis not present

## 2015-01-12 DIAGNOSIS — E669 Obesity, unspecified: Secondary | ICD-10-CM | POA: Diagnosis not present

## 2015-01-12 DIAGNOSIS — E782 Mixed hyperlipidemia: Secondary | ICD-10-CM | POA: Diagnosis not present

## 2015-01-12 DIAGNOSIS — E1129 Type 2 diabetes mellitus with other diabetic kidney complication: Secondary | ICD-10-CM | POA: Diagnosis not present

## 2015-01-12 DIAGNOSIS — D631 Anemia in chronic kidney disease: Secondary | ICD-10-CM | POA: Diagnosis not present

## 2015-01-12 DIAGNOSIS — Z23 Encounter for immunization: Secondary | ICD-10-CM | POA: Diagnosis not present

## 2015-01-12 DIAGNOSIS — N184 Chronic kidney disease, stage 4 (severe): Secondary | ICD-10-CM | POA: Diagnosis not present

## 2015-01-12 DIAGNOSIS — Z940 Kidney transplant status: Secondary | ICD-10-CM | POA: Diagnosis not present

## 2015-01-12 DIAGNOSIS — K219 Gastro-esophageal reflux disease without esophagitis: Secondary | ICD-10-CM | POA: Diagnosis not present

## 2015-01-12 DIAGNOSIS — M722 Plantar fascial fibromatosis: Secondary | ICD-10-CM | POA: Diagnosis not present

## 2015-01-12 DIAGNOSIS — N2581 Secondary hyperparathyroidism of renal origin: Secondary | ICD-10-CM | POA: Diagnosis not present

## 2015-01-12 DIAGNOSIS — I129 Hypertensive chronic kidney disease with stage 1 through stage 4 chronic kidney disease, or unspecified chronic kidney disease: Secondary | ICD-10-CM | POA: Diagnosis not present

## 2015-01-30 DIAGNOSIS — Z940 Kidney transplant status: Secondary | ICD-10-CM | POA: Diagnosis not present

## 2015-02-06 DIAGNOSIS — I129 Hypertensive chronic kidney disease with stage 1 through stage 4 chronic kidney disease, or unspecified chronic kidney disease: Secondary | ICD-10-CM | POA: Diagnosis not present

## 2015-02-06 DIAGNOSIS — Z940 Kidney transplant status: Secondary | ICD-10-CM | POA: Diagnosis not present

## 2015-02-20 DIAGNOSIS — I129 Hypertensive chronic kidney disease with stage 1 through stage 4 chronic kidney disease, or unspecified chronic kidney disease: Secondary | ICD-10-CM | POA: Diagnosis not present

## 2015-04-27 DIAGNOSIS — E669 Obesity, unspecified: Secondary | ICD-10-CM | POA: Diagnosis not present

## 2015-04-27 DIAGNOSIS — N2581 Secondary hyperparathyroidism of renal origin: Secondary | ICD-10-CM | POA: Diagnosis not present

## 2015-04-27 DIAGNOSIS — E1129 Type 2 diabetes mellitus with other diabetic kidney complication: Secondary | ICD-10-CM | POA: Diagnosis not present

## 2015-04-27 DIAGNOSIS — K219 Gastro-esophageal reflux disease without esophagitis: Secondary | ICD-10-CM | POA: Diagnosis not present

## 2015-04-27 DIAGNOSIS — N184 Chronic kidney disease, stage 4 (severe): Secondary | ICD-10-CM | POA: Diagnosis not present

## 2015-04-27 DIAGNOSIS — D631 Anemia in chronic kidney disease: Secondary | ICD-10-CM | POA: Diagnosis not present

## 2015-04-27 DIAGNOSIS — I129 Hypertensive chronic kidney disease with stage 1 through stage 4 chronic kidney disease, or unspecified chronic kidney disease: Secondary | ICD-10-CM | POA: Diagnosis not present

## 2015-04-27 DIAGNOSIS — E782 Mixed hyperlipidemia: Secondary | ICD-10-CM | POA: Diagnosis not present

## 2015-04-27 DIAGNOSIS — Z940 Kidney transplant status: Secondary | ICD-10-CM | POA: Diagnosis not present

## 2015-05-07 ENCOUNTER — Telehealth: Payer: Self-pay | Admitting: Family Medicine

## 2015-05-07 NOTE — Telephone Encounter (Signed)
Ok to establish 

## 2015-05-07 NOTE — Telephone Encounter (Signed)
Pt called to inquire about [place] office and would like to est Dr. Birdie Riddle as new PCP. Please advise.

## 2015-05-10 DIAGNOSIS — N184 Chronic kidney disease, stage 4 (severe): Secondary | ICD-10-CM | POA: Diagnosis not present

## 2015-05-10 DIAGNOSIS — Z940 Kidney transplant status: Secondary | ICD-10-CM | POA: Diagnosis not present

## 2015-05-15 DIAGNOSIS — E119 Type 2 diabetes mellitus without complications: Secondary | ICD-10-CM | POA: Diagnosis not present

## 2015-05-15 DIAGNOSIS — Z4822 Encounter for aftercare following kidney transplant: Secondary | ICD-10-CM | POA: Diagnosis not present

## 2015-05-15 DIAGNOSIS — D899 Disorder involving the immune mechanism, unspecified: Secondary | ICD-10-CM | POA: Diagnosis not present

## 2015-05-15 DIAGNOSIS — Z888 Allergy status to other drugs, medicaments and biological substances status: Secondary | ICD-10-CM | POA: Diagnosis not present

## 2015-05-15 DIAGNOSIS — Z79899 Other long term (current) drug therapy: Secondary | ICD-10-CM | POA: Diagnosis not present

## 2015-05-15 DIAGNOSIS — Z940 Kidney transplant status: Secondary | ICD-10-CM | POA: Diagnosis not present

## 2015-05-15 DIAGNOSIS — E139 Other specified diabetes mellitus without complications: Secondary | ICD-10-CM | POA: Diagnosis not present

## 2015-05-15 DIAGNOSIS — I10 Essential (primary) hypertension: Secondary | ICD-10-CM | POA: Diagnosis not present

## 2015-05-15 DIAGNOSIS — Z7984 Long term (current) use of oral hypoglycemic drugs: Secondary | ICD-10-CM | POA: Diagnosis not present

## 2015-05-15 DIAGNOSIS — E785 Hyperlipidemia, unspecified: Secondary | ICD-10-CM | POA: Diagnosis not present

## 2015-05-17 DIAGNOSIS — Z940 Kidney transplant status: Secondary | ICD-10-CM | POA: Diagnosis not present

## 2015-05-17 DIAGNOSIS — Z4822 Encounter for aftercare following kidney transplant: Secondary | ICD-10-CM | POA: Diagnosis not present

## 2015-05-17 DIAGNOSIS — Z992 Dependence on renal dialysis: Secondary | ICD-10-CM | POA: Diagnosis not present

## 2015-05-17 DIAGNOSIS — D899 Disorder involving the immune mechanism, unspecified: Secondary | ICD-10-CM | POA: Diagnosis not present

## 2015-05-17 DIAGNOSIS — Z888 Allergy status to other drugs, medicaments and biological substances status: Secondary | ICD-10-CM | POA: Diagnosis not present

## 2015-05-17 DIAGNOSIS — N186 End stage renal disease: Secondary | ICD-10-CM | POA: Diagnosis not present

## 2015-05-17 DIAGNOSIS — I120 Hypertensive chronic kidney disease with stage 5 chronic kidney disease or end stage renal disease: Secondary | ICD-10-CM | POA: Diagnosis not present

## 2015-05-17 DIAGNOSIS — Z7982 Long term (current) use of aspirin: Secondary | ICD-10-CM | POA: Diagnosis not present

## 2015-05-17 DIAGNOSIS — Z79899 Other long term (current) drug therapy: Secondary | ICD-10-CM | POA: Diagnosis not present

## 2015-05-17 DIAGNOSIS — E119 Type 2 diabetes mellitus without complications: Secondary | ICD-10-CM | POA: Diagnosis not present

## 2015-05-22 DIAGNOSIS — I129 Hypertensive chronic kidney disease with stage 1 through stage 4 chronic kidney disease, or unspecified chronic kidney disease: Secondary | ICD-10-CM | POA: Diagnosis not present

## 2015-05-22 DIAGNOSIS — N184 Chronic kidney disease, stage 4 (severe): Secondary | ICD-10-CM | POA: Diagnosis not present

## 2015-05-22 DIAGNOSIS — Z940 Kidney transplant status: Secondary | ICD-10-CM | POA: Diagnosis not present

## 2015-06-07 DIAGNOSIS — H2511 Age-related nuclear cataract, right eye: Secondary | ICD-10-CM | POA: Diagnosis not present

## 2015-06-07 DIAGNOSIS — H2512 Age-related nuclear cataract, left eye: Secondary | ICD-10-CM | POA: Diagnosis not present

## 2015-06-07 DIAGNOSIS — E099 Drug or chemical induced diabetes mellitus without complications: Secondary | ICD-10-CM | POA: Diagnosis not present

## 2015-06-07 DIAGNOSIS — E093292 Drug or chemical induced diabetes mellitus with mild nonproliferative diabetic retinopathy without macular edema, left eye: Secondary | ICD-10-CM | POA: Diagnosis not present

## 2015-06-12 DIAGNOSIS — N186 End stage renal disease: Secondary | ICD-10-CM | POA: Diagnosis not present

## 2015-06-12 DIAGNOSIS — D8989 Other specified disorders involving the immune mechanism, not elsewhere classified: Secondary | ICD-10-CM | POA: Diagnosis not present

## 2015-06-12 DIAGNOSIS — E1122 Type 2 diabetes mellitus with diabetic chronic kidney disease: Secondary | ICD-10-CM | POA: Diagnosis not present

## 2015-06-12 DIAGNOSIS — D649 Anemia, unspecified: Secondary | ICD-10-CM | POA: Diagnosis not present

## 2015-06-12 DIAGNOSIS — N183 Chronic kidney disease, stage 3 (moderate): Secondary | ICD-10-CM | POA: Diagnosis not present

## 2015-06-12 DIAGNOSIS — Z940 Kidney transplant status: Secondary | ICD-10-CM | POA: Diagnosis not present

## 2015-06-12 DIAGNOSIS — I120 Hypertensive chronic kidney disease with stage 5 chronic kidney disease or end stage renal disease: Secondary | ICD-10-CM | POA: Diagnosis not present

## 2015-06-12 DIAGNOSIS — Z7984 Long term (current) use of oral hypoglycemic drugs: Secondary | ICD-10-CM | POA: Diagnosis not present

## 2015-06-12 DIAGNOSIS — I129 Hypertensive chronic kidney disease with stage 1 through stage 4 chronic kidney disease, or unspecified chronic kidney disease: Secondary | ICD-10-CM | POA: Diagnosis not present

## 2015-06-12 DIAGNOSIS — Z8719 Personal history of other diseases of the digestive system: Secondary | ICD-10-CM | POA: Diagnosis not present

## 2015-06-12 DIAGNOSIS — Z4822 Encounter for aftercare following kidney transplant: Secondary | ICD-10-CM | POA: Diagnosis not present

## 2015-06-12 DIAGNOSIS — Z888 Allergy status to other drugs, medicaments and biological substances status: Secondary | ICD-10-CM | POA: Diagnosis not present

## 2015-06-12 DIAGNOSIS — E785 Hyperlipidemia, unspecified: Secondary | ICD-10-CM | POA: Diagnosis not present

## 2015-06-12 DIAGNOSIS — I158 Other secondary hypertension: Secondary | ICD-10-CM | POA: Diagnosis not present

## 2015-06-12 DIAGNOSIS — Z79899 Other long term (current) drug therapy: Secondary | ICD-10-CM | POA: Diagnosis not present

## 2015-06-12 DIAGNOSIS — Z5181 Encounter for therapeutic drug level monitoring: Secondary | ICD-10-CM | POA: Diagnosis not present

## 2015-06-12 DIAGNOSIS — N179 Acute kidney failure, unspecified: Secondary | ICD-10-CM | POA: Diagnosis not present

## 2015-06-12 DIAGNOSIS — Z7982 Long term (current) use of aspirin: Secondary | ICD-10-CM | POA: Diagnosis not present

## 2015-06-12 DIAGNOSIS — R7989 Other specified abnormal findings of blood chemistry: Secondary | ICD-10-CM | POA: Diagnosis not present

## 2015-06-12 DIAGNOSIS — D899 Disorder involving the immune mechanism, unspecified: Secondary | ICD-10-CM | POA: Diagnosis not present

## 2015-06-19 ENCOUNTER — Encounter: Payer: Self-pay | Admitting: Family Medicine

## 2015-06-27 ENCOUNTER — Telehealth: Payer: Self-pay

## 2015-06-27 NOTE — Telephone Encounter (Signed)
Left message on machine for patient to return call

## 2015-06-27 NOTE — Telephone Encounter (Signed)
Left message on machine for patient to return call for pre-visit

## 2015-06-28 ENCOUNTER — Encounter: Payer: Self-pay | Admitting: General Practice

## 2015-06-28 ENCOUNTER — Ambulatory Visit (INDEPENDENT_AMBULATORY_CARE_PROVIDER_SITE_OTHER): Payer: Medicare Other | Admitting: Family Medicine

## 2015-06-28 ENCOUNTER — Telehealth: Payer: Self-pay | Admitting: Family Medicine

## 2015-06-28 ENCOUNTER — Telehealth: Payer: Self-pay

## 2015-06-28 ENCOUNTER — Encounter: Payer: Self-pay | Admitting: Family Medicine

## 2015-06-28 VITALS — BP 130/80 | HR 71 | Temp 98.1°F | Resp 16 | Ht 60.0 in | Wt 172.0 lb

## 2015-06-28 DIAGNOSIS — Z940 Kidney transplant status: Secondary | ICD-10-CM

## 2015-06-28 DIAGNOSIS — I10 Essential (primary) hypertension: Secondary | ICD-10-CM

## 2015-06-28 DIAGNOSIS — E1169 Type 2 diabetes mellitus with other specified complication: Secondary | ICD-10-CM | POA: Insufficient documentation

## 2015-06-28 DIAGNOSIS — M25559 Pain in unspecified hip: Secondary | ICD-10-CM

## 2015-06-28 DIAGNOSIS — E1122 Type 2 diabetes mellitus with diabetic chronic kidney disease: Secondary | ICD-10-CM | POA: Diagnosis not present

## 2015-06-28 DIAGNOSIS — N183 Chronic kidney disease, stage 3 unspecified: Secondary | ICD-10-CM

## 2015-06-28 DIAGNOSIS — E785 Hyperlipidemia, unspecified: Secondary | ICD-10-CM | POA: Diagnosis not present

## 2015-06-28 DIAGNOSIS — E119 Type 2 diabetes mellitus without complications: Secondary | ICD-10-CM | POA: Insufficient documentation

## 2015-06-28 DIAGNOSIS — F4321 Adjustment disorder with depressed mood: Secondary | ICD-10-CM | POA: Insufficient documentation

## 2015-06-28 LAB — CBC WITH DIFFERENTIAL/PLATELET
BASOS ABS: 0.1 10*3/uL (ref 0.0–0.1)
Basophils Relative: 0.7 % (ref 0.0–3.0)
EOS ABS: 0.3 10*3/uL (ref 0.0–0.7)
Eosinophils Relative: 3 % (ref 0.0–5.0)
HEMATOCRIT: 34.4 % — AB (ref 36.0–46.0)
HEMOGLOBIN: 11.2 g/dL — AB (ref 12.0–15.0)
LYMPHS PCT: 19.7 % (ref 12.0–46.0)
Lymphs Abs: 1.8 10*3/uL (ref 0.7–4.0)
MCHC: 32.6 g/dL (ref 30.0–36.0)
MCV: 87.8 fl (ref 78.0–100.0)
Monocytes Absolute: 0.7 10*3/uL (ref 0.1–1.0)
Monocytes Relative: 7.7 % (ref 3.0–12.0)
Neutro Abs: 6.4 10*3/uL (ref 1.4–7.7)
Neutrophils Relative %: 68.9 % (ref 43.0–77.0)
Platelets: 240 10*3/uL (ref 150.0–400.0)
RBC: 3.91 Mil/uL (ref 3.87–5.11)
RDW: 14.9 % (ref 11.5–15.5)
WBC: 9.3 10*3/uL (ref 4.0–10.5)

## 2015-06-28 LAB — BASIC METABOLIC PANEL
BUN: 42 mg/dL — AB (ref 6–23)
CO2: 25 mEq/L (ref 19–32)
CREATININE: 2.83 mg/dL — AB (ref 0.40–1.20)
Calcium: 10.2 mg/dL (ref 8.4–10.5)
Chloride: 105 mEq/L (ref 96–112)
GFR: 22.1 mL/min — AB (ref >60.00)
GLUCOSE: 86 mg/dL (ref 70–99)
POTASSIUM: 3 meq/L — AB (ref 3.5–5.1)
Sodium: 141 mEq/L (ref 135–145)

## 2015-06-28 LAB — HEPATIC FUNCTION PANEL
ALT: 14 U/L (ref 0–35)
AST: 17 U/L (ref 0–37)
Albumin: 4.3 g/dL (ref 3.5–5.2)
Alkaline Phosphatase: 75 U/L (ref 39–117)
Bilirubin, Direct: 0.1 mg/dL (ref 0.0–0.3)
TOTAL PROTEIN: 7.9 g/dL (ref 6.0–8.3)
Total Bilirubin: 0.4 mg/dL (ref 0.2–1.2)

## 2015-06-28 LAB — LIPID PANEL
CHOL/HDL RATIO: 5
CHOLESTEROL: 174 mg/dL (ref 0–200)
HDL: 37.9 mg/dL — ABNORMAL LOW (ref >39.00)
LDL CALC: 98 mg/dL (ref 0–99)
NonHDL: 136.46
TRIGLYCERIDES: 194 mg/dL — AB (ref 0.0–149.0)
VLDL: 38.8 mg/dL (ref 0.0–40.0)

## 2015-06-28 LAB — TSH: TSH: 0.91 u[IU]/mL (ref 0.35–4.50)

## 2015-06-28 LAB — HEMOGLOBIN A1C: Hgb A1c MFr Bld: 7.1 % — ABNORMAL HIGH (ref 4.6–6.5)

## 2015-06-28 NOTE — Patient Instructions (Signed)
Follow up in 3-4 months to recheck diabetes We'll notify you of your lab results and make any changes if needed Keep up the good work!  You look great! Call with any questions or concerns Welcome!  We're glad to have you! Happy Spring!!!

## 2015-06-28 NOTE — Telephone Encounter (Signed)
Please advise, pt established care today and I do not see any record of this being mentioned

## 2015-06-28 NOTE — Telephone Encounter (Signed)
She did not mention her hip and I am not sure what her concern is.  I need more information so we can decide where it would be appropriate to send her

## 2015-06-28 NOTE — Telephone Encounter (Signed)
Called pt back and LMOVM to return call to get more information.

## 2015-06-28 NOTE — Progress Notes (Signed)
   Subjective:    Patient ID: [name] [name], female    DOB: 1955-04-23, 60 y.o.   MRN: [id number]  HPI New to establish.  Previous MD- [facility]  HTN- chronic problem, on Losartan, Amlodipine, Atenolol, Lasix, Imdur.  Pt denies CP, SOB, HAs, visual changes, edema.  DM- chronic problem, on Glipizide.  Pt is seeing Dr Zadie Rhine- last eye exam 2 weeks ago.  On ARB for renal protection.  Pt having intermittent lows- able to recognize and correct w/ food.  No numbness or tingling of hands/feet.  Hyperlipidemia- chronic problem, on Simvastatin.  No abd pain, N/V.  No regular exercise.  Pt has lost ~15 lbs since August.  Reports this due to grief at loss of husband.    ESRD-  Pt is s/p renal transplant.  Following w/ Dr Jimmy Footman.  Grief- pt's husband passed 1 yr ago, not interested in medication at this time.   Review of Systems For ROS see HPI     Objective:   Physical Exam  Constitutional: She is oriented to person, place, and time. She appears well-developed and well-nourished. No distress.  HENT:  Head: Normocephalic and atraumatic.  Eyes: Conjunctivae and EOM are normal. Pupils are equal, round, and reactive to light.  Neck: Normal range of motion. Neck supple. No thyromegaly present.  Cardiovascular: Normal rate, regular rhythm, normal heart sounds and intact distal pulses.   No murmur heard. Pulmonary/Chest: Effort normal and breath sounds normal. No respiratory distress.  Abdominal: Soft. She exhibits no distension. There is no tenderness.  Musculoskeletal: She exhibits no edema.  Lymphadenopathy:    She has no cervical adenopathy.  Neurological: She is alert and oriented to person, place, and time.  Skin: Skin is warm and dry.  Psychiatric: She has a normal mood and affect. Her behavior is normal.  Vitals reviewed.         Assessment & Plan:

## 2015-06-28 NOTE — Telephone Encounter (Signed)
Caller name:Aleece Relationship to patient: Can be reached:[phone] Pharmacy:  Reason for call:She is having pain in her left hip and needs to have that checked out

## 2015-06-28 NOTE — Progress Notes (Signed)
Pre visit review using our clinic review tool, if applicable. No additional management support is needed unless otherwise documented below in the visit note. 

## 2015-06-28 NOTE — Telephone Encounter (Signed)
I have been unable to reach this patient by phone.

## 2015-06-28 NOTE — Telephone Encounter (Signed)
Said she forgot to mention it at the visit

## 2015-06-29 NOTE — Telephone Encounter (Signed)
Called pt back again today to find out more about the pain. Could not leave a message due to mail not being set up.

## 2015-06-30 NOTE — Assessment & Plan Note (Signed)
New to provider, ongoing for pt.  Developed diabetes in the setting of post-transplant rejection meds.  On ARB for renal protection.  UTD on eye exam, foot exam done today.  Stressed need for healthy diet and regular exercise.  Check labs.  Adjust meds prn

## 2015-06-30 NOTE — Assessment & Plan Note (Signed)
New to provider, ongoing for pt.  She is coming up on the 1 year anniversary of husband's death and she is having a hard time.  Not interested in medication at this time.  Will follow.

## 2015-06-30 NOTE — Assessment & Plan Note (Signed)
Chronic problem.  Adequate control.  Asymptomatic.  Check labs.  No anticipated med changes.  Will follow. 

## 2015-06-30 NOTE — Assessment & Plan Note (Signed)
New to provider, ongoing for pt.  Tolerating statin w/o difficulty.  Check labs.  Adjust meds prn

## 2015-06-30 NOTE — Assessment & Plan Note (Signed)
New.  Pt has hx of bilateral renal transplant.  Continues to follow w/ Dr Jimmy Footman.  Continues her immunosuppressants to prevent rejection.  Will follow.

## 2015-07-02 ENCOUNTER — Encounter: Payer: Self-pay | Admitting: General Practice

## 2015-07-02 MED ORDER — POTASSIUM CHLORIDE CRYS ER 20 MEQ PO TBCR: 20.0000 meq | EXTENDED_RELEASE_TABLET | Freq: Two times a day (BID) | ORAL | Status: DC

## 2015-07-02 NOTE — Telephone Encounter (Signed)
Spoke with pt who advised that she has had a past history with bursitis and feels that it is flaring up again. Referral placed to ortho today from a verbal from PCP.

## 2015-07-04 ENCOUNTER — Ambulatory Visit: Payer: Medicare Other | Admitting: Family Medicine

## 2015-07-10 ENCOUNTER — Telehealth: Payer: Self-pay | Admitting: Family Medicine

## 2015-07-10 NOTE — Telephone Encounter (Signed)
Pt states that she would like to have something for depression, [facility] [address].

## 2015-07-11 NOTE — Telephone Encounter (Signed)
This would require an appt

## 2015-07-11 NOTE — Telephone Encounter (Signed)
Called pt and LMOVM to have her call the office to schedule an office visit to discuss her depression.

## 2015-08-01 LAB — HM DIABETES EYE EXAM

## 2015-08-06 DIAGNOSIS — N2581 Secondary hyperparathyroidism of renal origin: Secondary | ICD-10-CM | POA: Diagnosis not present

## 2015-08-06 DIAGNOSIS — Z940 Kidney transplant status: Secondary | ICD-10-CM | POA: Diagnosis not present

## 2015-08-06 DIAGNOSIS — I129 Hypertensive chronic kidney disease with stage 1 through stage 4 chronic kidney disease, or unspecified chronic kidney disease: Secondary | ICD-10-CM | POA: Diagnosis not present

## 2015-08-06 DIAGNOSIS — N184 Chronic kidney disease, stage 4 (severe): Secondary | ICD-10-CM | POA: Diagnosis not present

## 2015-08-06 DIAGNOSIS — E1129 Type 2 diabetes mellitus with other diabetic kidney complication: Secondary | ICD-10-CM | POA: Diagnosis not present

## 2015-08-06 DIAGNOSIS — K219 Gastro-esophageal reflux disease without esophagitis: Secondary | ICD-10-CM | POA: Diagnosis not present

## 2015-08-06 DIAGNOSIS — E669 Obesity, unspecified: Secondary | ICD-10-CM | POA: Diagnosis not present

## 2015-08-06 DIAGNOSIS — M722 Plantar fascial fibromatosis: Secondary | ICD-10-CM | POA: Diagnosis not present

## 2015-08-06 DIAGNOSIS — E782 Mixed hyperlipidemia: Secondary | ICD-10-CM | POA: Diagnosis not present

## 2015-08-06 DIAGNOSIS — Z0000 Encounter for general adult medical examination without abnormal findings: Secondary | ICD-10-CM | POA: Diagnosis not present

## 2015-08-06 DIAGNOSIS — D631 Anemia in chronic kidney disease: Secondary | ICD-10-CM | POA: Diagnosis not present

## 2015-08-14 DIAGNOSIS — M7072 Other bursitis of hip, left hip: Secondary | ICD-10-CM | POA: Diagnosis not present

## 2015-08-22 ENCOUNTER — Telehealth: Payer: Self-pay | Admitting: Gastroenterology

## 2015-08-22 NOTE — Telephone Encounter (Signed)
Patient is mailed an [employer].  She is advised to try gas-x or phazyme.

## 2015-10-01 ENCOUNTER — Ambulatory Visit (INDEPENDENT_AMBULATORY_CARE_PROVIDER_SITE_OTHER): Payer: Medicare Other | Admitting: Family Medicine

## 2015-10-01 ENCOUNTER — Encounter: Payer: Self-pay | Admitting: Family Medicine

## 2015-10-01 VITALS — BP 126/78 | HR 70 | Temp 97.9°F | Resp 16 | Ht 60.0 in | Wt 169.5 lb

## 2015-10-01 DIAGNOSIS — N183 Chronic kidney disease, stage 3 unspecified: Secondary | ICD-10-CM

## 2015-10-01 DIAGNOSIS — E1122 Type 2 diabetes mellitus with diabetic chronic kidney disease: Secondary | ICD-10-CM

## 2015-10-01 DIAGNOSIS — Z23 Encounter for immunization: Secondary | ICD-10-CM | POA: Diagnosis not present

## 2015-10-01 LAB — BASIC METABOLIC PANEL
BUN: 31 mg/dL — ABNORMAL HIGH (ref 6–23)
CO2: 27 meq/L (ref 19–32)
Calcium: 10 mg/dL (ref 8.4–10.5)
Chloride: 106 mEq/L (ref 96–112)
Creatinine, Ser: 2.63 mg/dL — ABNORMAL HIGH (ref 0.40–1.20)
GFR: 24.03 mL/min — ABNORMAL LOW (ref >60.00)
GLUCOSE: 106 mg/dL — AB (ref 70–99)
Potassium: 3.8 mEq/L (ref 3.5–5.1)
SODIUM: 143 meq/L (ref 135–145)

## 2015-10-01 LAB — HEMOGLOBIN A1C: HEMOGLOBIN A1C: 6.9 % — AB (ref 4.6–6.5)

## 2015-10-01 NOTE — Patient Instructions (Signed)
Schedule your complete physical in 3-4 months We'll notify you of your lab results and make any changes if needed Keep up the good work on healthy diet and regular exercise- you look great!! Call with any questions or concerns Have a great summer!!!

## 2015-10-01 NOTE — Progress Notes (Signed)
Pre visit review using our clinic review tool, if applicable. No additional management support is needed unless otherwise documented below in the visit note. 

## 2015-10-01 NOTE — Addendum Note (Signed)
Addended by: Desmond Dike L on: 10/01/2015 11:22 AM   Modules accepted: Orders

## 2015-10-01 NOTE — Assessment & Plan Note (Signed)
Chronic problem.  Pt reports good control of sugars at home.  On Glipizide due to renal transplant (not a candidate for Metformin).  UTD on eye exam, foot exam.  On ARB for renal protection.  Check labs.  Adjust meds prn

## 2015-10-01 NOTE — Progress Notes (Signed)
   Subjective:    Patient ID: [name] [name], female    DOB: 04-May-1955, 60 y.o.   MRN: [id number]  HPI DM- chronic problem, on glipizide.  On ARB for renal protection.  UTD on eye exam, foot exam.  Pt reports home CBGs vary 'based on what I eat'.  AM sugars 100-125.  150-170 after eating.  Will have rare intermittent lows.  Mild tingling in feet but not in hands.  No CP, SOB, HAs, visual changes, edema.   Review of Systems For ROS see HPI     Objective:   Physical Exam  Constitutional: She is oriented to person, place, and time. She appears well-developed and well-nourished. No distress.  HENT:  Head: Normocephalic and atraumatic.  Eyes: Conjunctivae and EOM are normal. Pupils are equal, round, and reactive to light.  Neck: Normal range of motion. Neck supple. No thyromegaly present.  Cardiovascular: Normal rate, regular rhythm, normal heart sounds and intact distal pulses.   No murmur heard. Pulmonary/Chest: Effort normal and breath sounds normal. No respiratory distress.  Abdominal: Soft. She exhibits no distension. There is no tenderness.  Musculoskeletal: She exhibits no edema.  Lymphadenopathy:    She has no cervical adenopathy.  Neurological: She is alert and oriented to person, place, and time.  Skin: Skin is warm and dry.  Psychiatric: She has a normal mood and affect. Her behavior is normal.  Vitals reviewed.         Assessment & Plan:

## 2015-10-02 ENCOUNTER — Encounter: Payer: Self-pay | Admitting: General Practice

## 2015-10-03 ENCOUNTER — Ambulatory Visit (INDEPENDENT_AMBULATORY_CARE_PROVIDER_SITE_OTHER): Payer: Medicare Other | Admitting: Gastroenterology

## 2015-10-03 ENCOUNTER — Encounter: Payer: Self-pay | Admitting: Gastroenterology

## 2015-10-03 VITALS — BP 140/88 | HR 72 | Ht 65.0 in | Wt 169.4 lb

## 2015-10-03 DIAGNOSIS — R143 Flatulence: Secondary | ICD-10-CM

## 2015-10-03 DIAGNOSIS — R195 Other fecal abnormalities: Secondary | ICD-10-CM

## 2015-10-03 NOTE — Patient Instructions (Signed)
Follow the instructions on the Hemoccult cards and mail them back to [place] when you are finished or you may take them directly to the lab in the basement of the [facility] building. We will call you with the results.   Start a lactose free diet x 1 week to see if this helps improve your symptoms.  You can take over the counter Gas-X four times a day for gas and bloating.   Try a probiotic such as Align, Restora or Florastor x 1 month.   Thank you for choosing me and [facility].  Pricilla Riffle. Dagoberto Ligas., MD., Marval Regal

## 2015-10-03 NOTE — Progress Notes (Signed)
History of Present Illness: This is a 60 year old female self referred for the evaluation of gas and flatus. Symptoms have been bothersome for bout 3 months. She was placed on a low gas diet and advised to use Gas-X regularly which she states helped a little. About 3 weeks ago she had an episode of black stool which was not tarry or sticky and it did not occur her stools have been normal since then.  EGD in March 2011 was normal. Colonoscopy in March 2011 showed internal hemorrhoids and a mild nonspecific colitis. Denies weight loss, abdominal pain, constipation, diarrhea, change in stool caliber, hematochezia, nausea, vomiting, dysphagia, reflux symptoms, chest pain.  Allergies  Allergen Reactions  . Benadryl [Diphenhydramine Hcl] Shortness Of Breath, Swelling and Other (See Comments)    Throat irritation also  . Lisinopril Shortness Of Breath, Swelling and Other (See Comments)    Throat irritation also  . Daypro [Oxaprozin] Dermatitis    hives   Outpatient Prescriptions Prior to Visit  Medication Sig Dispense Refill  . acetaminophen (TYLENOL) 500 MG tablet Take 1,000 mg by mouth every 6 (six) hours as needed for moderate pain.    [name] amLODipine (NORVASC) 10 MG tablet Take 10 mg by mouth daily.      [name] aspirin 325 MG tablet Take 325 mg by mouth daily.      [name] atenolol (TENORMIN) 50 MG tablet Take 50 mg by mouth 2 (two) times daily. Reported on 06/28/2015    . calcitRIOL (ROCALTROL) 0.5 MCG capsule Take 0.5 mcg by mouth daily.    . carvedilol (COREG) 6.25 MG tablet Take 6.25 mg by mouth 2 (two) times daily.  10  . clonazePAM (KLONOPIN) 1 MG tablet Take 1 mg by mouth 2 (two) times daily.  2  . estradiol (VIVELLE-DOT) 0.1 MG/24HR patch Place 1 patch onto the skin 2 (two) times a week. Reported on 06/28/2015    . furosemide (LASIX) 80 MG tablet Take 160 mg by mouth 2 (two) times daily.     [name] glipiZIDE (GLUCOTROL XL) 5 MG 24 hr tablet Take 5 mg by mouth 2 (two) times daily.  3  . isosorbide  mononitrate (IMDUR) 30 MG 24 hr tablet Take 1 tablet (30 mg total) by mouth daily. 30 tablet 11  . losartan (COZAAR) 100 MG tablet Take 100 mg by mouth 2 (two) times daily.    . Magnesium 250 MG TABS Take 500 mg by mouth daily.    . meloxicam (MOBIC) 15 MG tablet Take 1 tablet (15 mg total) by mouth daily. 30 tablet 0  . Multiple Vitamin (MULTIVITAMIN) tablet Take 1 tablet by mouth daily.      . mycophenolate (CELLCEPT) 250 MG capsule Take 750 mg by mouth 2 (two) times daily.     [name] omeprazole (PRILOSEC) 40 MG capsule Take 40 mg by mouth daily.      . potassium chloride SA (K-DUR,KLOR-CON) 20 MEQ tablet Take 1 tablet (20 mEq total) by mouth 2 (two) times daily. 60 tablet 6  . predniSONE (DELTASONE) 5 MG tablet Take 5 mg by mouth every Monday, Wednesday, and Friday.    . simvastatin (ZOCOR) 40 MG tablet Take 20 mg by mouth at bedtime.    . tacrolimus (PROGRAF) 1 MG capsule Take 4-5 mg by mouth 2 (two) times daily. Takes 5mg  in am and 4mg  in pm    . temazepam (RESTORIL) 15 MG capsule Take 15 mg by mouth at bedtime.    . nitroGLYCERIN (NITROSTAT) 0.4  MG SL tablet Place 1 tablet (0.4 mg total) under the tongue every 5 (five) minutes as needed for chest pain. (Patient not taking: Reported on 10/03/2015) 25 tablet 3  . ondansetron (ZOFRAN-ODT) 8 MG disintegrating tablet Take 1 tablet (8 mg total) by mouth every 8 (eight) hours as needed for nausea or vomiting. 10 tablet 0   No facility-administered medications prior to visit.   Past Medical History  Diagnosis Date  . Chronic kidney disease   . Pneumonia   . Hypertension   . Hyperlipidemia   . Hemorrhoids   . Esophagitis     Grade 1 Distal  . Adenomatous colon polyp 04/1998  . Diabetes mellitus   . Seizures ([place])   . Metabolic acidosis   . Anemia   . Hyperkalemia   . Chronic renal failure     post transplant  . GERD (gastroesophageal reflux disease)   . Hx of cardiovascular stress test     Lexiscan Myoview (06/2013):  No ischemia, EF 66%;  normal.    . Hx of echocardiogram     a. Echocardiogram (06/2013):  Mod focal basal hypertrophy, EF 60-65%, normal wall motion, Gr 1 DD, mild AI, mildly dilated ascending aorta (41 mm), mild LAE.; b.  Echo 9/16: mod LVH, EF 60-65%, no RWMA, Gr 1 DD, trivial AI, mild dilated ascending aorta, mild LAE   Past Surgical History  Procedure Laterality Date  . Abdominal hysterectomy    . Kidney transplant  2009    Both  . Cesarean section    . Av fistula placement  07/04/2005    Cimino AV fistula  . Dg av dialysis graft declot or  07/24/2005    AV Gore-Tex graf  . Dg av dialysis graft declot or  Thrombosis right forearm, loop arteriovenous    Thrombosis right forearm, loop arteriovenous graft  . Av fistula placement w/ ptfe  08/27/2005  . Av fistula placement  08/27/2005  . Thrombectomy / arteriovenous graft revision  10/12/2006  . Thrombectomy / arteriovenous graft revision  10/16/2006   Social History   Social History  . Marital Status: Married    Spouse Name: N/A  . Number of Children: N/A  . Years of Education: N/A   Occupational History  . Disabled    Social History Main Topics  . Smoking status: Never Smoker   . Smokeless tobacco: Never Used  . Alcohol Use: No  . Drug Use: No  . Sexual Activity: Not Asked   Other Topics Concern  . None   Social History Narrative   Family History  Problem Relation Age of Onset  . Kidney disease Paternal Aunt   . Heart disease Mother   . Heart disease Father   . Colon cancer Neg Hx      Review of Systems: Pertinent positive and negative review of systems were noted in the above HPI section. All other review of systems were otherwise negative.  Physical Exam: General: Well developed, well nourished, no acute distress Head: Normocephalic and atraumatic Eyes:  sclerae anicteric, EOMI Ears: Normal auditory acuity Mouth: No deformity or lesions Neck: Supple, no masses or thyromegaly Lungs: Clear throughout to auscultation Heart:  Regular rate and rhythm; no murmurs, rubs or bruits Abdomen: Soft, non tender and non distended. No masses, hepatosplenomegaly or hernias noted. Normal Bowel sounds Musculoskeletal: Symmetrical with no gross deformities  Skin: No lesions on visible extremities Pulses:  Normal pulses noted Extremities: No clubbing, cyanosis, edema or deformities noted Neurological: Alert oriented x 4,  grossly nonfocal Cervical Nodes:  No significant cervical adenopathy Inguinal Nodes: No significant inguinal adenopathy Psychological:  Alert and cooperative. Normal mood and affect  Assessment and Recommendations:  1. Gas and flatus. Trial of complete lactose avoidance for 7-10 days. If her symptoms persist she will try 3 courses of different probiotics for one month each. She is advised to follow a low gas diet and use Gas-X 4 times a day when necessary. Consider treatment for possible SIBO if symptoms persist.  2. Dark stool, not tarry or sticky, 3 weeks ago. Stool hemoccults. Contact us if she has recurrent dark stools.

## 2015-10-30 DIAGNOSIS — M5431 Sciatica, right side: Secondary | ICD-10-CM | POA: Diagnosis not present

## 2015-11-19 ENCOUNTER — Encounter (INDEPENDENT_AMBULATORY_CARE_PROVIDER_SITE_OTHER): Payer: Self-pay

## 2015-11-19 ENCOUNTER — Encounter: Payer: Self-pay | Admitting: Cardiovascular Disease

## 2015-11-19 ENCOUNTER — Ambulatory Visit (INDEPENDENT_AMBULATORY_CARE_PROVIDER_SITE_OTHER): Payer: Medicare Other | Admitting: Cardiovascular Disease

## 2015-11-19 VITALS — BP 134/80 | HR 61 | Ht 65.0 in | Wt 167.1 lb

## 2015-11-19 DIAGNOSIS — K219 Gastro-esophageal reflux disease without esophagitis: Secondary | ICD-10-CM | POA: Diagnosis not present

## 2015-11-19 DIAGNOSIS — I129 Hypertensive chronic kidney disease with stage 1 through stage 4 chronic kidney disease, or unspecified chronic kidney disease: Secondary | ICD-10-CM | POA: Diagnosis not present

## 2015-11-19 DIAGNOSIS — N2581 Secondary hyperparathyroidism of renal origin: Secondary | ICD-10-CM | POA: Diagnosis not present

## 2015-11-19 DIAGNOSIS — E1129 Type 2 diabetes mellitus with other diabetic kidney complication: Secondary | ICD-10-CM | POA: Diagnosis not present

## 2015-11-19 DIAGNOSIS — M722 Plantar fascial fibromatosis: Secondary | ICD-10-CM | POA: Diagnosis not present

## 2015-11-19 DIAGNOSIS — E782 Mixed hyperlipidemia: Secondary | ICD-10-CM | POA: Diagnosis not present

## 2015-11-19 DIAGNOSIS — I10 Essential (primary) hypertension: Secondary | ICD-10-CM | POA: Diagnosis not present

## 2015-11-19 DIAGNOSIS — N184 Chronic kidney disease, stage 4 (severe): Secondary | ICD-10-CM | POA: Diagnosis not present

## 2015-11-19 DIAGNOSIS — D631 Anemia in chronic kidney disease: Secondary | ICD-10-CM | POA: Diagnosis not present

## 2015-11-19 DIAGNOSIS — Z940 Kidney transplant status: Secondary | ICD-10-CM | POA: Diagnosis not present

## 2015-11-19 NOTE — Patient Instructions (Signed)
Medication Instructions:  Your physician recommends that you continue on your current medications as directed. Please refer to the Current Medication list given to you today.   Labwork: None ordered  Testing/Procedures: None ordered  Follow-Up: Your physician wants you to follow-up in 1 year with Dr. Acie Fredrickson. You will receive a reminder letter in the mail two months in advance. If you don't receive a letter, please call our office to schedule an appointment.     If you need a refill on your cardiac medications before your next appointment, please call your pharmacy.

## 2015-11-19 NOTE — Progress Notes (Signed)
Cardiology Office Note   Date:  11/19/2015   ID:  [name] [name], DOB 1955-09-11, MRN [id number]  PCP:  Annye Asa, MD  Cardiologist:  Dr. Liam Rogers   Electrophysiologist:  n/a  Chief Complaint  Patient presents with  . Chest Pain    Problem list 1. Essential hypertension 2. Hyperlipidemia 3. Diabetes mellitus 4. End-stage renal disease-status post renal transplantation in 2009 5. Noncardiac chest pain  [name] [name] is a 60 y.o. female with a hx of ESRD status post renal transplant in 2009, HTN, HL, Diabetes, chest pain. Patient was evaluated by Dr. Acie Fredrickson in 2013 for chest discomfort. Myoview study was normal.  I saw her 06/16/13 with complaints of exertional chest discomfort consistent with CCS class III angina. Given her poor renal function in the setting of prior renal transplant, we opted for noninvasive evaluation and medical therapy. Stress testing was performed and demonstrated no ischemia. Echocardiogram demonstrated normal LV function, mild diastolic dysfunction and a mildly dilated ascending aorta. I placed her on isosorbide with improved symptoms.  Last seen in 4/15.   She returns for FU.  She is here alone.  Unfortunately her husband passed away this year.  She had some chest pressure when he died. Otherwise, she denies chest pain.  She continues to remain stable on Isosorbide.  She denies DOE, orthopnea, PND.  She has LE edema.  This is stable.  She denies syncope.     Studies/Reports Reviewed Today:  - Echo (07/16/05): Moderate LVH, normal LV function, trivial AI, mild RVH - Echo (07/01/13): Moderate focal basal hypertrophy, EF 60-65%, normal wall motion, grade 1 diastolic dysfunction, mild AI, dilated ascending aorta (41 mm), mild LAE. - Nuclear (07/31/11): LexiScan, no scar or ischemia, EF 77%; normal - Nuclear (06/30/13): No ischemia, EF 66%; NORMAL  Aug. 28, 2017:  [name] is seen back for evaluation of her Chest pain she was seen by  Richardson Dopp in August, 2016.  Echocardiogram performed in September, 2016 reveals normal left ventricle function. Ascending aorta is   mildly dilated.  She is doing well. No CP  She cut her Simva in half   Past Medical History:  Diagnosis Date  . Adenomatous colon polyp 04/1998  . Anemia   . Chronic kidney disease   . Chronic renal failure    post transplant  . Diabetes mellitus   . Esophagitis    Grade 1 Distal  . GERD (gastroesophageal reflux disease)   . Hemorrhoids   . Hx of cardiovascular stress test    Lexiscan Myoview (06/2013):  No ischemia, EF 66%; normal.    . Hx of echocardiogram    a. Echocardiogram (06/2013):  Mod focal basal hypertrophy, EF 60-65%, normal wall motion, Gr 1 DD, mild AI, mildly dilated ascending aorta (41 mm), mild LAE.; b.  Echo 9/16: mod LVH, EF 60-65%, no RWMA, Gr 1 DD, trivial AI, mild dilated ascending aorta, mild LAE  . Hyperkalemia   . Hyperlipidemia   . Hypertension   . Metabolic acidosis   . Pneumonia   . Seizures ([place])     Past Surgical History:  Procedure Laterality Date  . ABDOMINAL HYSTERECTOMY    . AV FISTULA PLACEMENT  07/04/2005   Cimino AV fistula  . AV FISTULA PLACEMENT  08/27/2005  . AV FISTULA PLACEMENT W/ PTFE  08/27/2005  . CESAREAN SECTION    . DG AV DIALYSIS GRAFT DECLOT OR  07/24/2005   AV Gore-Tex graf  . DG AV DIALYSIS GRAFT  DECLOT OR  Thrombosis right forearm, loop arteriovenous   Thrombosis right forearm, loop arteriovenous graft  . KIDNEY TRANSPLANT  2009   Both  . THROMBECTOMY / ARTERIOVENOUS GRAFT REVISION  10/12/2006  . THROMBECTOMY / ARTERIOVENOUS GRAFT REVISION  10/16/2006     Current Outpatient Prescriptions  Medication Sig Dispense Refill  . acetaminophen (TYLENOL) 500 MG tablet Take 1,000 mg by mouth every 6 (six) hours as needed for moderate pain.    [name] amLODipine (NORVASC) 10 MG tablet Take 10 mg by mouth daily.      [name] aspirin 325 MG tablet Take 325 mg by mouth daily.      [name] atenolol (TENORMIN)  50 MG tablet Take 50 mg by mouth 2 (two) times daily. Reported on 06/28/2015    . calcitRIOL (ROCALTROL) 0.5 MCG capsule Take 0.5 mcg by mouth daily.    . carvedilol (COREG) 6.25 MG tablet Take 6.25 mg by mouth 2 (two) times daily.  10  . clonazePAM (KLONOPIN) 1 MG tablet Take 1 mg by mouth 2 (two) times daily.  2  . estradiol (VIVELLE-DOT) 0.1 MG/24HR patch Place 1 patch onto the skin 2 (two) times a week. Reported on 06/28/2015    . furosemide (LASIX) 80 MG tablet Take 160 mg by mouth 2 (two) times daily.     [name] glipiZIDE (GLUCOTROL XL) 5 MG 24 hr tablet Take 5 mg by mouth 2 (two) times daily.  3  . isosorbide mononitrate (IMDUR) 30 MG 24 hr tablet Take 1 tablet (30 mg total) by mouth daily. 30 tablet 11  . losartan (COZAAR) 100 MG tablet Take 100 mg by mouth 2 (two) times daily.    . Magnesium 250 MG TABS Take 500 mg by mouth daily.    . meloxicam (MOBIC) 15 MG tablet Take 1 tablet (15 mg total) by mouth daily. 30 tablet 0  . Multiple Vitamin (MULTIVITAMIN) tablet Take 1 tablet by mouth daily.      . mycophenolate (CELLCEPT) 250 MG capsule Take 750 mg by mouth 2 (two) times daily.     . nitroGLYCERIN (NITROSTAT) 0.4 MG SL tablet Place 1 tablet (0.4 mg total) under the tongue every 5 (five) minutes as needed for chest pain. 25 tablet 3  . omeprazole (PRILOSEC) 40 MG capsule Take 40 mg by mouth daily.      . potassium chloride SA (K-DUR,KLOR-CON) 20 MEQ tablet Take 1 tablet (20 mEq total) by mouth 2 (two) times daily. 60 tablet 6  . predniSONE (DELTASONE) 5 MG tablet Take 5 mg by mouth every Monday, Wednesday, and Friday.    . simvastatin (ZOCOR) 40 MG tablet Take 20 mg by mouth at bedtime.    . tacrolimus (PROGRAF) 1 MG capsule Take 4-5 mg by mouth 2 (two) times daily. Takes 5mg  in am and 4mg  in pm    . temazepam (RESTORIL) 15 MG capsule Take 15 mg by mouth at bedtime.     No current facility-administered medications for this visit.     Allergies:   Benadryl [diphenhydramine hcl]; Lisinopril;  and Daypro [oxaprozin]    Social History:  The patient  reports that she has never smoked. She has never used smokeless tobacco. She reports that she does not drink alcohol or use drugs.   Family History:  The patient's family history includes Heart disease in her father and mother; Kidney disease in her paternal aunt.    ROS:   Please see the history of present illness.   Review of Systems  Cardiovascular: Positive for chest pain and leg swelling.  All other systems reviewed and are negative.     PHYSICAL EXAM: VS:  BP 134/80 (BP Location: Left Arm, Patient Position: Sitting, Cuff Size: Normal)   Pulse 61   Ht 5\' 5"  (1.651 m)   Wt 167 lb 1.9 oz (75.8 kg)   BMI 27.81 kg/m     Wt Readings from Last 3 Encounters:  11/19/15 167 lb 1.9 oz (75.8 kg)  10/03/15 169 lb 6 oz (76.8 kg)  10/01/15 169 lb 8 oz (76.9 kg)     GEN: Well nourished, well developed, in no acute distress  HEENT: normal  Neck: no JVD,  no masses Cardiac:  Normal 99991111, RRR; 1/6 systolic murmur RUSB,  no rubs or gallops, trace bilateral LE edema   Respiratory:  clear to auscultation bilaterally, no wheezing, rhonchi or rales. GI: soft, nontender, nondistended, + BS MS: no deformity or atrophy  Skin: warm and dry  Neuro:  CNs II-XII intact, Strength and sensation are intact Psych: Normal affect   EKG:  EKG is ordered today.  It demonstrates:     NSR at 61.   LAD .  Poor R wave progression    Recent Labs: 06/28/2015: ALT 14; Hemoglobin 11.2; Platelets 240.0; TSH 0.91 10/01/2015: BUN 31; Creatinine, Ser 2.63; Potassium 3.8; Sodium 143    Lipid Panel    Component Value Date/Time   CHOL 174 06/28/2015 1032   TRIG 194.0 (H) 06/28/2015 1032   HDL 37.90 (L) 06/28/2015 1032   CHOLHDL 5 06/28/2015 1032   VLDL 38.8 06/28/2015 1032   LDLCALC 98 06/28/2015 1032      ASSESSMENT AND PLAN:  Precordial pain:   Overall, her symptoms have remained stable on nitrate therapy. As noted, stress testing last year was  low risk. Continue current therapy.  ESRD (end stage renal disease) s/p renal transplant:   Follow-up with nephrology as planned. She has had some leg cramps recently. She takes a fairly large dose of Lasix. I will obtain a BMET today.  Essential hypertension, benign:   Controlled.  HLD (hyperlipidemia):   Managed by her primary MD   Thoracic ascending aortic aneurysm (41 mm on Echo 06/2013) -   Stable    Medication Changes: Current medicines are reviewed at length with the patient today.  Concerns regarding medicines are as outlined above.  The following changes have been made:   Discontinued Medications   No medications on file   Modified Medications   No medications on file   New Prescriptions   No medications on file    Labs/ tests ordered today include:   No orders of the defined types were placed in this encounter.    Mertie Moores, MD  11/19/2015 3:00 PM    [address],  [place], [place]  [address] Pager [phone] Phone: [phone]; Fax: [phone]

## 2015-12-04 ENCOUNTER — Other Ambulatory Visit: Payer: Self-pay | Admitting: Physician Assistant

## 2015-12-04 DIAGNOSIS — I10 Essential (primary) hypertension: Secondary | ICD-10-CM

## 2015-12-20 DIAGNOSIS — I129 Hypertensive chronic kidney disease with stage 1 through stage 4 chronic kidney disease, or unspecified chronic kidney disease: Secondary | ICD-10-CM | POA: Diagnosis not present

## 2015-12-24 ENCOUNTER — Other Ambulatory Visit: Payer: Self-pay | Admitting: General Practice

## 2015-12-24 MED ORDER — POTASSIUM CHLORIDE CRYS ER 20 MEQ PO TBCR: 20.0000 meq | EXTENDED_RELEASE_TABLET | Freq: Two times a day (BID) | ORAL | 1 refills | Status: DC

## 2015-12-25 ENCOUNTER — Other Ambulatory Visit: Payer: Self-pay | Admitting: Emergency Medicine

## 2015-12-25 ENCOUNTER — Telehealth: Payer: Self-pay | Admitting: Family Medicine

## 2015-12-25 MED ORDER — CLONAZEPAM 1 MG PO TABS: 1.0000 mg | ORAL_TABLET | Freq: Two times a day (BID) | ORAL | 1 refills | Status: DC

## 2015-12-25 NOTE — Telephone Encounter (Signed)
Pt asking for refill on clonazepam, [facility] [address]

## 2015-12-25 NOTE — Telephone Encounter (Signed)
Last Rx: 06/07/15 2 RF  Last Ov: 10/01/15 DM follow up

## 2015-12-25 NOTE — Telephone Encounter (Signed)
Ok for #60, 1 refill 

## 2015-12-25 NOTE — Telephone Encounter (Signed)
Rx printed and faxed to [facility] on [address] for patient.

## 2016-01-01 DIAGNOSIS — N184 Chronic kidney disease, stage 4 (severe): Secondary | ICD-10-CM | POA: Diagnosis not present

## 2016-01-01 DIAGNOSIS — Z940 Kidney transplant status: Secondary | ICD-10-CM | POA: Diagnosis not present

## 2016-01-14 DIAGNOSIS — M5442 Lumbago with sciatica, left side: Secondary | ICD-10-CM | POA: Diagnosis not present

## 2016-01-21 DIAGNOSIS — M5442 Lumbago with sciatica, left side: Secondary | ICD-10-CM | POA: Diagnosis not present

## 2016-01-23 DIAGNOSIS — Z940 Kidney transplant status: Secondary | ICD-10-CM | POA: Diagnosis not present

## 2016-01-23 DIAGNOSIS — N184 Chronic kidney disease, stage 4 (severe): Secondary | ICD-10-CM | POA: Diagnosis not present

## 2016-01-28 ENCOUNTER — Ambulatory Visit (INDEPENDENT_AMBULATORY_CARE_PROVIDER_SITE_OTHER): Payer: Medicare Other | Admitting: Family Medicine

## 2016-01-28 ENCOUNTER — Encounter: Payer: Self-pay | Admitting: Family Medicine

## 2016-01-28 VITALS — BP 142/82 | HR 71 | Temp 98.1°F | Resp 16 | Ht 65.0 in | Wt 163.0 lb

## 2016-01-28 DIAGNOSIS — E7849 Other hyperlipidemia: Secondary | ICD-10-CM

## 2016-01-28 DIAGNOSIS — Z0000 Encounter for general adult medical examination without abnormal findings: Secondary | ICD-10-CM

## 2016-01-28 DIAGNOSIS — N186 End stage renal disease: Secondary | ICD-10-CM

## 2016-01-28 DIAGNOSIS — E1122 Type 2 diabetes mellitus with diabetic chronic kidney disease: Secondary | ICD-10-CM | POA: Diagnosis not present

## 2016-01-28 DIAGNOSIS — Z1152 Encounter for screening for COVID-19: Secondary | ICD-10-CM | POA: Insufficient documentation

## 2016-01-28 DIAGNOSIS — N184 Chronic kidney disease, stage 4 (severe): Secondary | ICD-10-CM

## 2016-01-28 DIAGNOSIS — E784 Other hyperlipidemia: Secondary | ICD-10-CM | POA: Diagnosis not present

## 2016-01-28 DIAGNOSIS — I10 Essential (primary) hypertension: Secondary | ICD-10-CM

## 2016-01-28 DIAGNOSIS — I712 Thoracic aortic aneurysm, without rupture: Secondary | ICD-10-CM

## 2016-01-28 DIAGNOSIS — I7121 Aneurysm of the ascending aorta, without rupture: Secondary | ICD-10-CM

## 2016-01-28 LAB — BASIC METABOLIC PANEL
BUN: 34 mg/dL — ABNORMAL HIGH (ref 6–23)
CALCIUM: 9.7 mg/dL (ref 8.4–10.5)
CO2: 29 meq/L (ref 19–32)
CREATININE: 3.09 mg/dL — AB (ref 0.40–1.20)
Chloride: 105 mEq/L (ref 96–112)
GFR: 19.93 mL/min — ABNORMAL LOW (ref >60.00)
GLUCOSE: 78 mg/dL (ref 70–99)
Potassium: 3.7 mEq/L (ref 3.5–5.1)
SODIUM: 145 meq/L (ref 135–145)

## 2016-01-28 LAB — LDL CHOLESTEROL, DIRECT: LDL DIRECT: 91 mg/dL

## 2016-01-28 LAB — HEPATIC FUNCTION PANEL
ALT: 10 U/L (ref 0–35)
AST: 13 U/L (ref 0–37)
Albumin: 4.3 g/dL (ref 3.5–5.2)
Alkaline Phosphatase: 58 U/L (ref 39–117)
BILIRUBIN DIRECT: 0.1 mg/dL (ref 0.0–0.3)
TOTAL PROTEIN: 7.1 g/dL (ref 6.0–8.3)
Total Bilirubin: 0.4 mg/dL (ref 0.2–1.2)

## 2016-01-28 LAB — LIPID PANEL
Cholesterol: 185 mg/dL (ref 0–200)
HDL: 36.8 mg/dL — ABNORMAL LOW (ref >39.00)
NonHDL: 148.28
TRIGLYCERIDES: 210 mg/dL — AB (ref 0.0–149.0)
Total CHOL/HDL Ratio: 5
VLDL: 42 mg/dL — ABNORMAL HIGH (ref 0.0–40.0)

## 2016-01-28 LAB — CBC WITH DIFFERENTIAL/PLATELET
BASOS ABS: 0 10*3/uL (ref 0.0–0.1)
Basophils Relative: 0.4 % (ref 0.0–3.0)
EOS ABS: 0.2 10*3/uL (ref 0.0–0.7)
Eosinophils Relative: 1.7 % (ref 0.0–5.0)
HEMATOCRIT: 35.3 % — AB (ref 36.0–46.0)
Hemoglobin: 11.5 g/dL — ABNORMAL LOW (ref 12.0–15.0)
LYMPHS PCT: 18.9 % (ref 12.0–46.0)
Lymphs Abs: 1.8 10*3/uL (ref 0.7–4.0)
MCHC: 32.7 g/dL (ref 30.0–36.0)
MCV: 89.2 fl (ref 78.0–100.0)
Monocytes Absolute: 0.8 10*3/uL (ref 0.1–1.0)
Monocytes Relative: 8.6 % (ref 3.0–12.0)
NEUTROS PCT: 70.4 % (ref 43.0–77.0)
Neutro Abs: 6.6 10*3/uL (ref 1.4–7.7)
Platelets: 271 10*3/uL (ref 150.0–400.0)
RBC: 3.95 Mil/uL (ref 3.87–5.11)
RDW: 14.8 % (ref 11.5–15.5)
WBC: 9.4 10*3/uL (ref 4.0–10.5)

## 2016-01-28 LAB — TSH: TSH: 0.55 u[IU]/mL (ref 0.35–4.50)

## 2016-01-28 LAB — HEMOGLOBIN A1C: Hgb A1c MFr Bld: 6.8 % — ABNORMAL HIGH (ref 4.6–6.5)

## 2016-01-28 NOTE — Assessment & Plan Note (Signed)
Chronic problem.  BP is elevated today but she did not take meds.  Asymptomatic at this time.  Check labs.  Will defer med changes based on renal function to Dr Deterding.  Pt expressed understanding and is in agreement w/ plan.

## 2016-01-28 NOTE — Assessment & Plan Note (Signed)
Pt's PE WNL w/ exception of being overweight.  Has pap and mammo scheduled.  UTD on colonoscopy.  UTD on immunizations.  Check labs.  Anticipatory guidance provided.

## 2016-01-28 NOTE — Assessment & Plan Note (Signed)
Chronic problem.  Following w/ Dr Jimmy Footman.  S/p cadaver transplant.  On anti-rejection meds.  Cr continues to rise.  Check labs.  Will forward to Dr Deterding when available.

## 2016-01-28 NOTE — Progress Notes (Signed)
   Subjective:    Patient ID: [name] [name], female    DOB: 11-Mar-1956, 60 y.o.   MRN: [id number]  HPI Here today for CPE.  Risk Factors: Hyperlipidemia- chronic problem, on Simvastatin. No abd pain, N/V, myalgias DM- chronic problem, on Glipizide.  On ACE for renal protection.  UTD on eye exam, foot exam.  Denies symptomatic lows, numbness/tingling of hands/feet ESRD- s/p cadaver renal transplant, on Cellcept, Prednisone, Prograf.  Following w/ Dr Jimmy Footman.  Denies edema. HTN- chronic problem, on Amlodipine, Coreg, Lasix, Losartan but she did not take meds today so the value is not accurate.  No CP, SOB, HAs, visual changes, edema. Physical Activity: no regular activity Fall Risk: low Depression: some down days since husband passed but 'nothing that sticks' Hearing: normal to conversational tones and whispered voice at 6 ft ADL's: independent Cognitive: normal linear thought process, memory and attention intact Home Safety: safe at home, lives alone Height, Weight, BMI, Visual Acuity: see vitals, vision corrected to 20/20 w/ glasses Counseling: UTD on colonoscopy, due 2021, pap and mammo scheduled for 11/21, UTD on pneumovax, flu shot Care team reviewed and updated Labs Ordered: See A&P Care Plan: See A&P    Review of Systems Patient reports no vision/ hearing changes, adenopathy,fever, weight change,  persistant/recurrent hoarseness , swallowing issues, chest pain, palpitations, edema, persistant/recurrent cough, hemoptysis, dyspnea (rest/exertional/paroxysmal nocturnal), gastrointestinal bleeding (melena, rectal bleeding), abdominal pain, significant heartburn, bowel changes, GU symptoms (dysuria, hematuria, incontinence), Gyn symptoms (abnormal  bleeding, pain),  syncope, focal weakness, memory loss, numbness & tingling, skin/hair/nail changes, abnormal bruising or bleeding, anxiety, or depression.     Objective:   Physical Exam General Appearance:    Alert, cooperative, no  distress, appears stated age  Head:    Normocephalic, without obvious abnormality, atraumatic  Eyes:    PERRL, conjunctiva/corneas clear, EOM's intact, fundi    benign, both eyes  Ears:    Normal TM's and external ear canals, both ears  Nose:   Nares normal, septum midline, mucosa normal, no drainage    or sinus tenderness  Throat:   Lips, mucosa, and tongue normal; teeth and gums normal  Neck:   Supple, symmetrical, trachea midline, no adenopathy;    Thyroid: no enlargement/tenderness/nodules  Back:     Symmetric, no curvature, ROM normal, no CVA tenderness  Lungs:     Clear to auscultation bilaterally, respirations unlabored  Chest Wall:    No tenderness or deformity   Heart:    Regular rate and rhythm, S1 and S2 normal, I-II/VI SEM, no   rub or gallop  Breast Exam:    Deferred to GYN  Abdomen:     Soft, non-tender, bowel sounds active all four quadrants,    no masses, no organomegaly  Genitalia:    Deferred to GYN  Rectal:    Extremities:   Extremities normal, atraumatic, no cyanosis or edema  Pulses:   2+ and symmetric all extremities  Skin:   Skin color, texture, turgor normal, no rashes or lesions  Lymph nodes:   Cervical, supraclavicular, and axillary nodes normal  Neurologic:   CNII-XII intact, normal strength, sensation and reflexes    throughout          Assessment & Plan:

## 2016-01-28 NOTE — Progress Notes (Signed)
Pre visit review using our clinic review tool, if applicable. No additional management support is needed unless otherwise documented below in the visit note. 

## 2016-01-28 NOTE — Assessment & Plan Note (Signed)
Ongoing issue for pt.  Overdue for ECHO (was due in September).  Refer back to Cards

## 2016-01-28 NOTE — Assessment & Plan Note (Signed)
Chronic problem.  On Statin w/o difficulty.  Stressed need for healthy diet and regular exericse.  Check labs.  Adjust meds prn

## 2016-01-28 NOTE — Patient Instructions (Signed)
Follow up in 3-4 months to recheck diabetes We'll notify you of your lab results and make any changes if needed Please continue to work on healthy diet and regular exercise- you look great!! You are up to date on Colonoscopy (due 2021), pneumovax and flu shot- yay! Please have Dr Gaetano Net send me copies of his exam and mammogram report Call with any questions or concerns Hang in there! Happy Holidays!!!

## 2016-01-28 NOTE — Assessment & Plan Note (Signed)
Chronic problem.  On Glipizide daily w/ hx of good control.  UTD on foot exam, eye exam.  On ACE for renal protection.  Stressed need for healthy diet and regular exercise.  Check labs.  Adjust meds prn

## 2016-01-29 ENCOUNTER — Other Ambulatory Visit: Payer: Self-pay | Admitting: General Practice

## 2016-01-29 MED ORDER — FENOFIBRATE 160 MG PO TABS: 160.0000 mg | ORAL_TABLET | Freq: Every day | ORAL | 6 refills | Status: DC

## 2016-01-31 DIAGNOSIS — M5442 Lumbago with sciatica, left side: Secondary | ICD-10-CM | POA: Diagnosis not present

## 2016-02-01 ENCOUNTER — Telehealth: Payer: Self-pay | Admitting: Family Medicine

## 2016-02-01 NOTE — Telephone Encounter (Signed)
Can you please call pt and see if she needs to be seen at office or UC, and possibly schedule at another location.

## 2016-02-01 NOTE — Telephone Encounter (Signed)
Left message on machine requesting further information about the knot on neck and scheduling at another location if need to be seen today. Dr Raoul Pitch has availability today this afternoon.
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
## 2016-02-08 ENCOUNTER — Ambulatory Visit (INDEPENDENT_AMBULATORY_CARE_PROVIDER_SITE_OTHER): Payer: Self-pay | Admitting: Family Medicine

## 2016-02-08 ENCOUNTER — Encounter: Payer: Self-pay | Admitting: Family Medicine

## 2016-02-08 VITALS — BP 123/82 | HR 87 | Temp 98.5°F | Resp 17 | Ht 65.0 in | Wt 164.4 lb

## 2016-02-08 DIAGNOSIS — M62838 Other muscle spasm: Secondary | ICD-10-CM

## 2016-02-08 DIAGNOSIS — I10 Essential (primary) hypertension: Secondary | ICD-10-CM | POA: Diagnosis not present

## 2016-02-08 DIAGNOSIS — Z940 Kidney transplant status: Secondary | ICD-10-CM | POA: Diagnosis not present

## 2016-02-08 DIAGNOSIS — N184 Chronic kidney disease, stage 4 (severe): Secondary | ICD-10-CM | POA: Diagnosis not present

## 2016-02-08 MED ORDER — CYCLOBENZAPRINE HCL 10 MG PO TABS: 10.0000 mg | ORAL_TABLET | Freq: Three times a day (TID) | ORAL | 0 refills | Status: DC | PRN

## 2016-02-08 NOTE — Patient Instructions (Signed)
Follow up as needed/scheduled Start the Flexeril (muscle relaxer) for nights and weekends (will cause drowsiness) Tylenol as needed for pain HEAT! A massage would help if possible Call with any questions or concerns Feel better! Happy Holidays!!!
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
## 2016-02-08 NOTE — Progress Notes (Signed)
Pre visit review using our clinic review tool, if applicable. No additional management support is needed unless otherwise documented below in the visit note. 

## 2016-02-11 DIAGNOSIS — N184 Chronic kidney disease, stage 4 (severe): Secondary | ICD-10-CM | POA: Diagnosis not present

## 2016-02-11 DIAGNOSIS — I129 Hypertensive chronic kidney disease with stage 1 through stage 4 chronic kidney disease, or unspecified chronic kidney disease: Secondary | ICD-10-CM | POA: Diagnosis not present

## 2016-02-11 DIAGNOSIS — Z23 Encounter for immunization: Secondary | ICD-10-CM | POA: Diagnosis not present

## 2016-02-11 DIAGNOSIS — E1129 Type 2 diabetes mellitus with other diabetic kidney complication: Secondary | ICD-10-CM | POA: Diagnosis not present

## 2016-02-11 DIAGNOSIS — K219 Gastro-esophageal reflux disease without esophagitis: Secondary | ICD-10-CM | POA: Diagnosis not present

## 2016-02-11 DIAGNOSIS — E782 Mixed hyperlipidemia: Secondary | ICD-10-CM | POA: Diagnosis not present

## 2016-02-11 DIAGNOSIS — D631 Anemia in chronic kidney disease: Secondary | ICD-10-CM | POA: Diagnosis not present

## 2016-02-11 DIAGNOSIS — N2581 Secondary hyperparathyroidism of renal origin: Secondary | ICD-10-CM | POA: Diagnosis not present

## 2016-02-11 DIAGNOSIS — Z940 Kidney transplant status: Secondary | ICD-10-CM | POA: Diagnosis not present

## 2016-02-11 DIAGNOSIS — M722 Plantar fascial fibromatosis: Secondary | ICD-10-CM | POA: Diagnosis not present

## 2016-02-11 DIAGNOSIS — E669 Obesity, unspecified: Secondary | ICD-10-CM | POA: Diagnosis not present

## 2016-02-25 ENCOUNTER — Telehealth: Payer: Self-pay | Admitting: Cardiovascular Disease

## 2016-02-25 NOTE — Telephone Encounter (Signed)
Patient having active chest pain. Encouraged patient to take a nitro. Patient stated her chest pain went away with the nitro. Stayed on phone with patient past 5 minutes after taking nitro, to see if chest pain came back. Patient stated she was still pain free. Informed patient if her chest pain comes back to call 911 and take another nitro. Educated patient on how to take nitro. Patient verbalized understanding. Kathalene Frames PA had an opening tomorrow, scheduled patient to be evaluated due to her frequent chest pain.

## 2016-02-25 NOTE — Telephone Encounter (Signed)
New message  1. Pt is currently having chest pains 2. No other symptoms 3. Since November 16th 4. Coming a going 5. Has nitro has not taken

## 2016-02-26 ENCOUNTER — Ambulatory Visit: Payer: Medicare Other | Admitting: Physician Assistant

## 2016-02-26 NOTE — Telephone Encounter (Signed)
Patient did not show up for her appointment today. Will forward to Dr. Acie Fredrickson and his nurse.

## 2016-02-26 NOTE — Progress Notes (Deleted)
Cardiology Office Note    Date:  02/26/2016   ID:  [name] [name], DOB 1955/12/12, MRN [id number]  PCP:  Annye Asa, MD  Cardiologist:  Dr. Acie Fredrickson  Chief Complaint: Chest pain  History of Present Illness:   [name] [name] is a 60 y.o. female  ESRD status post renal transplant in 2009 ( Followed by Dr. Jimmy Footman), HTN, HL, Diabetes, thoracic ascending aortic aneursyms and seizure who came in for chest pain evaluation.   Patient was evaluated by Dr. Acie Fredrickson in 2013 for chest discomfort. Myoview study was normal.   Seen by Richardson Dopp 2015 & 2016 for yearly follow up. She is no nitrate therapy for exertional chest discomfort consistent with CCS class III angina. Given her poor renal function in the setting of prior renal transplant, we opted for noninvasive evaluation and medical therapy.   Last Myoview 06/2014 was normal. Last echo 11/2014 showed EF of 60-65%;  moderate LVH; grade 1DD;  mild LAE; calcified aortic valve with trace AI; mildly dilated ascending aorta; trace MR and TR  Added to schedule for chest pain evaluation that is resolved with SL nitro.    Past Medical History:  Diagnosis Date  . Adenomatous colon polyp 04/1998  . Anemia   . Chronic kidney disease   . Chronic renal failure    post transplant  . Diabetes mellitus   . Esophagitis    Grade 1 Distal  . GERD (gastroesophageal reflux disease)   . Hemorrhoids   . Hx of cardiovascular stress test    Lexiscan Myoview (06/2013):  No ischemia, EF 66%; normal.    . Hx of echocardiogram    a. Echocardiogram (06/2013):  Mod focal basal hypertrophy, EF 60-65%, normal wall motion, Gr 1 DD, mild AI, mildly dilated ascending aorta (41 mm), mild LAE.; b.  Echo 9/16: mod LVH, EF 60-65%, no RWMA, Gr 1 DD, trivial AI, mild dilated ascending aorta, mild LAE  . Hyperkalemia   . Hyperlipidemia   . Hypertension   . Metabolic acidosis   . Pneumonia   . Seizures ([place])     Past Surgical History:  Procedure Laterality  Date  . ABDOMINAL HYSTERECTOMY    . AV FISTULA PLACEMENT  07/04/2005   Cimino AV fistula  . AV FISTULA PLACEMENT  08/27/2005  . AV FISTULA PLACEMENT W/ PTFE  08/27/2005  . CESAREAN SECTION    . DG AV DIALYSIS GRAFT DECLOT OR  07/24/2005   AV Gore-Tex graf  . DG AV DIALYSIS GRAFT DECLOT OR  Thrombosis right forearm, loop arteriovenous   Thrombosis right forearm, loop arteriovenous graft  . KIDNEY TRANSPLANT  2009   Both  . THROMBECTOMY / ARTERIOVENOUS GRAFT REVISION  10/12/2006  . THROMBECTOMY / ARTERIOVENOUS GRAFT REVISION  10/16/2006    Current Medications: Prior to Admission medications   Medication Sig Start Date End Date Taking? Authorizing Provider  acetaminophen (TYLENOL) 500 MG tablet Take 1,000 mg by mouth every 6 (six) hours as needed for moderate pain.    Historical Provider, MD  amLODipine (NORVASC) 10 MG tablet Take 10 mg by mouth daily.      Historical Provider, MD  calcitRIOL (ROCALTROL) 0.5 MCG capsule Take 0.5 mcg by mouth daily.    Historical Provider, MD  carvedilol (COREG) 6.25 MG tablet Take 6.25 mg by mouth 2 (two) times daily. 08/10/15   Historical Provider, MD  clonazePAM (KLONOPIN) 1 MG tablet Take 1 tablet (1 mg total) by mouth 2 (two) times daily. 12/25/15   Belenda Cruise  Wadie Lessen, MD  cyclobenzaprine (FLEXERIL) 10 MG tablet Take 1 tablet (10 mg total) by mouth 3 (three) times daily as needed for muscle spasms. 02/08/16   Midge Minium, MD  estradiol (VIVELLE-DOT) 0.1 MG/24HR patch Place 1 patch onto the skin 2 (two) times a week. Reported on 06/28/2015 06/24/13   Historical Provider, MD  fenofibrate 160 MG tablet Take 1 tablet (160 mg total) by mouth daily. 01/29/16   Midge Minium, MD  furosemide (LASIX) 80 MG tablet Take 160 mg by mouth 2 (two) times daily.     Historical Provider, MD  glipiZIDE (GLUCOTROL XL) 5 MG 24 hr tablet Take 5 mg by mouth 2 (two) times daily. 07/19/14   Historical Provider, MD  isosorbide mononitrate (IMDUR) 30 MG 24 hr tablet Take 1  tablet (30 mg total) by mouth daily. 12/04/15   Thayer Headings, MD  losartan (COZAAR) 100 MG tablet Take 100 mg by mouth 2 (two) times daily.    Historical Provider, MD  Magnesium 250 MG TABS Take 500 mg by mouth daily.    Historical Provider, MD  Multiple Vitamin (MULTIVITAMIN) tablet Take 1 tablet by mouth daily.      Historical Provider, MD  mycophenolate (CELLCEPT) 250 MG capsule Take 750 mg by mouth 2 (two) times daily.     Historical Provider, MD  nitroGLYCERIN (NITROSTAT) 0.4 MG SL tablet Place 1 tablet (0.4 mg total) under the tongue every 5 (five) minutes as needed for chest pain. 06/16/13   Liliane Shi, PA-C  omeprazole (PRILOSEC) 40 MG capsule Take 40 mg by mouth daily.      Historical Provider, MD  potassium chloride SA (K-DUR,KLOR-CON) 20 MEQ tablet Take 1 tablet (20 mEq total) by mouth 2 (two) times daily. 12/24/15   Midge Minium, MD  predniSONE (DELTASONE) 5 MG tablet Take 5 mg by mouth every Monday, Wednesday, and Friday.    Historical Provider, MD  simvastatin (ZOCOR) 80 MG tablet Take 40 mg by mouth at bedtime.  01/04/16   Historical Provider, MD  tacrolimus (PROGRAF) 1 MG capsule Takes 5mg  in am and 5mg  in pm    Historical Provider, MD  temazepam (RESTORIL) 15 MG capsule Take 15 mg by mouth at bedtime.    Historical Provider, MD    Allergies:   Lisinopril and Daypro [oxaprozin]   Social History   Social History  . Marital status: Married    Spouse name: N/A  . Number of children: N/A  . Years of education: N/A   Occupational History  . Disabled    Social History Main Topics  . Smoking status: Never Smoker  . Smokeless tobacco: Never Used  . Alcohol use No  . Drug use: No  . Sexual activity: Not on file   Other Topics Concern  . Not on file   Social History Narrative  . No narrative on file     Family History:  The patient's family history includes Heart disease in her father and mother; Kidney disease in her paternal aunt. ***  ROS:   Please see  the history of present illness.    ROS All other systems reviewed and are negative.   PHYSICAL EXAM:   VS:  There were no vitals taken for this visit.   GEN: Well nourished, well developed, in no acute distress  HEENT: normal  Neck: no JVD, carotid bruits, or masses Cardiac: ***RRR; no murmurs, rubs, or gallops,no edema  Respiratory:  clear to auscultation bilaterally, normal work  of breathing GI: soft, nontender, nondistended, + BS MS: no deformity or atrophy  Skin: warm and dry, no rash Neuro:  Alert and Oriented x 3, Strength and sensation are intact Psych: euthymic mood, full affect  Wt Readings from Last 3 Encounters:  02/08/16 164 lb 6 oz (74.6 kg)  01/28/16 163 lb (73.9 kg)  11/19/15 167 lb 1.9 oz (75.8 kg)      Studies/Labs Reviewed:   EKG:  EKG is ordered today.  The ekg ordered today demonstrates ***  Recent Labs: 01/28/2016: ALT 10; BUN 34; Creatinine, Ser 3.09; Hemoglobin 11.5; Platelets 271.0; Potassium 3.7; Sodium 145; TSH 0.55   Lipid Panel    Component Value Date/Time   CHOL 185 01/28/2016 1054   TRIG 210.0 (H) 01/28/2016 1054   HDL 36.80 (L) 01/28/2016 1054   CHOLHDL 5 01/28/2016 1054   VLDL 42.0 (H) 01/28/2016 1054   LDLCALC 98 06/28/2015 1032   LDLDIRECT 91.0 01/28/2016 1054    Additional studies/ records that were reviewed today include:   Echocardiogram: 11/2014 LV EF: 60% -   65%  ------------------------------------------------------------------- Indications:      Thoracic Ascending Aortic Aneurysm (I71.2).  ------------------------------------------------------------------- History:   PMH:  Edema, Chronic Kidney Disease, Seizures  Risk factors:  Family history of coronary artery disease. Hypertension. Diabetes mellitus. Dyslipidemia.  ------------------------------------------------------------------- Study Conclusions  - Left ventricle: The cavity size was normal. Wall thickness was   increased in a pattern of moderate LVH.  Systolic function was   normal. The estimated ejection fraction was in the range of 60%   to 65%. Wall motion was normal; there were no regional wall   motion abnormalities. Doppler parameters are consistent with   abnormal left ventricular relaxation (grade 1 diastolic   dysfunction). - Aortic valve: There was trivial regurgitation. - Ascending aorta: The ascending aorta was mildly dilated. - Left atrium: The atrium was mildly dilated.  Impressions:  - Normal LV systolic function; grade 1 diastolic dysfunction;   moderate LVH; mild LAE; calcified aortic valve with trace AI;   mildly dilated ascending aorta; trace MR and TR.  Cardiac Catheterization:     ASSESSMENT & PLAN:    1. Chest pain  2. HTN  3.  Mildly dilated ascending aorta on echo 11/2014  4. Chronic diastolic CHF  5. ESRD status post renal transplant in 2009 - Followed by Dr. Jimmy Footman. Last SCr was 3.09 on 01/28/16.   6. HLD - 06/28/2015: LDL Cholesterol 98 01/28/2016: Cholesterol 185; HDL 36.80; Triglycerides 210.0; VLDL 42.0    Medication Adjustments/Labs and Tests Ordered: Current medicines are reviewed at length with the patient today.  Concerns regarding medicines are outlined above.  Medication changes, Labs and Tests ordered today are listed in the Patient Instructions below. There are no Patient Instructions on file for this visit.   Jarrett Soho, [place]  02/26/2016 11:29 AM    [facility] [address], [place], [place]  [address] Phone: [phone]; Fax: [phone]

## 2016-02-27 ENCOUNTER — Encounter: Payer: Self-pay | Admitting: Cardiovascular Disease

## 2016-02-27 NOTE — Telephone Encounter (Signed)
Noted  

## 2016-03-03 ENCOUNTER — Ambulatory Visit: Payer: Medicare Other | Admitting: Cardiovascular Disease

## 2016-03-07 ENCOUNTER — Encounter: Payer: Self-pay | Admitting: Physician Assistant

## 2016-03-14 ENCOUNTER — Encounter: Payer: Self-pay | Admitting: Physician Assistant

## 2016-03-14 ENCOUNTER — Ambulatory Visit (INDEPENDENT_AMBULATORY_CARE_PROVIDER_SITE_OTHER): Payer: Medicare Other | Admitting: Physician Assistant

## 2016-03-14 VITALS — BP 130/82 | HR 68 | Ht 65.0 in | Wt 164.0 lb

## 2016-03-14 DIAGNOSIS — I7121 Aneurysm of the ascending aorta, without rupture: Secondary | ICD-10-CM

## 2016-03-14 DIAGNOSIS — R0989 Other specified symptoms and signs involving the circulatory and respiratory systems: Secondary | ICD-10-CM

## 2016-03-14 DIAGNOSIS — R072 Precordial pain: Secondary | ICD-10-CM

## 2016-03-14 DIAGNOSIS — E7800 Pure hypercholesterolemia, unspecified: Secondary | ICD-10-CM

## 2016-03-14 DIAGNOSIS — N184 Chronic kidney disease, stage 4 (severe): Secondary | ICD-10-CM | POA: Diagnosis not present

## 2016-03-14 DIAGNOSIS — I712 Thoracic aortic aneurysm, without rupture: Secondary | ICD-10-CM | POA: Diagnosis not present

## 2016-03-14 DIAGNOSIS — I10 Essential (primary) hypertension: Secondary | ICD-10-CM | POA: Diagnosis not present

## 2016-03-14 MED ORDER — ISOSORBIDE MONONITRATE ER 60 MG PO TB24: 60.0000 mg | ORAL_TABLET | Freq: Every day | ORAL | 3 refills | Status: DC

## 2016-03-14 NOTE — Patient Instructions (Signed)
Medication Instructions:  Increase Isosorbide to 60 mg Once daily - a new prescription was sent to your pharmacy.  Labwork: None   Testing/Procedures: 1. Schedule carotid ultrasound  2. Schedule Exercise Nuclear Stress Test  Follow-Up: Dr. Liam Rogers or Richardson Dopp, PA-C in 2-3 weeks.  Any Other Special Instructions Will Be Listed Below (If Applicable).  If you need a refill on your cardiac medications before your next appointment, please call your pharmacy.
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
## 2016-03-18 ENCOUNTER — Telehealth (HOSPITAL_COMMUNITY): Payer: Self-pay | Admitting: *Deleted

## 2016-03-18 ENCOUNTER — Inpatient Hospital Stay (HOSPITAL_COMMUNITY): Admission: RE | Admit: 2016-03-18 | Payer: Medicare Other | Source: Ambulatory Visit

## 2016-03-18 NOTE — Telephone Encounter (Signed)
Left message on voicemail per DPR in reference to upcoming appointment scheduled on 03/20/16 at 0730 with detailed instructions given per Myocardial Perfusion Study Information Sheet for the test. LM to arrive 15 minutes early, and that it is imperative to arrive on time for appointment to keep from having the test rescheduled. If you need to cancel or reschedule your appointment, please call the office within 24 hours of your appointment. Failure to do so may result in a cancellation of your appointment, and a $50 no show fee. Phone number given for call back for any questions.

## 2016-03-20 ENCOUNTER — Ambulatory Visit (HOSPITAL_COMMUNITY): Payer: Medicare Other | Attending: Cardiovascular Disease

## 2016-03-20 DIAGNOSIS — E119 Type 2 diabetes mellitus without complications: Secondary | ICD-10-CM | POA: Insufficient documentation

## 2016-03-20 DIAGNOSIS — I712 Thoracic aortic aneurysm, without rupture: Secondary | ICD-10-CM | POA: Insufficient documentation

## 2016-03-20 DIAGNOSIS — R072 Precordial pain: Secondary | ICD-10-CM | POA: Diagnosis not present

## 2016-03-20 DIAGNOSIS — R079 Chest pain, unspecified: Secondary | ICD-10-CM | POA: Diagnosis not present

## 2016-03-20 DIAGNOSIS — I10 Essential (primary) hypertension: Secondary | ICD-10-CM | POA: Insufficient documentation

## 2016-03-20 DIAGNOSIS — R0609 Other forms of dyspnea: Secondary | ICD-10-CM | POA: Diagnosis not present

## 2016-03-20 LAB — MYOCARDIAL PERFUSION IMAGING
CHL CUP NUCLEAR SDS: 0
CHL CUP NUCLEAR SRS: 1
CHL CUP NUCLEAR SSS: 1
CHL CUP RESTING HR STRESS: 77 {beats}/min
LHR: 0.35
LV dias vol: 97 mL (ref 46–106)
LV sys vol: 37 mL
NUC STRESS TID: 0.91
Peak HR: 120 {beats}/min

## 2016-03-20 MED ORDER — TECHNETIUM TC 99M TETROFOSMIN IV KIT
10.7000 | PACK | Freq: Once | INTRAVENOUS | Status: AC | PRN
  Administered 2016-03-20: 10.7 via INTRAVENOUS
  Filled 2016-03-20: qty 11

## 2016-03-20 MED ORDER — REGADENOSON 0.4 MG/5ML IV SOLN
0.4000 mg | Freq: Once | INTRAVENOUS | Status: AC
  Administered 2016-03-20: 0.4 mg via INTRAVENOUS

## 2016-03-20 MED ORDER — TECHNETIUM TC 99M TETROFOSMIN IV KIT
30.5000 | PACK | Freq: Once | INTRAVENOUS | Status: AC | PRN
  Administered 2016-03-20: 30.5 via INTRAVENOUS
  Filled 2016-03-20: qty 31

## 2016-03-21 ENCOUNTER — Encounter: Payer: Self-pay | Admitting: Physician Assistant

## 2016-03-21 ENCOUNTER — Telehealth: Payer: Self-pay | Admitting: *Deleted

## 2016-03-21 NOTE — Telephone Encounter (Signed)
DPR ok to leave detailed message on machine. Stress test normal and to continue current Tx plan. I will fax a copy over to PCP as well. Any questions feel free to call [phone].

## 2016-03-26 ENCOUNTER — Encounter: Payer: Self-pay | Admitting: Family Medicine

## 2016-03-26 ENCOUNTER — Ambulatory Visit (INDEPENDENT_AMBULATORY_CARE_PROVIDER_SITE_OTHER): Payer: Medicare Other | Admitting: Family Medicine

## 2016-03-26 VITALS — BP 130/90 | HR 79 | Temp 98.3°F | Resp 17 | Ht 65.0 in | Wt 164.5 lb

## 2016-03-26 DIAGNOSIS — H8112 Benign paroxysmal vertigo, left ear: Secondary | ICD-10-CM | POA: Diagnosis not present

## 2016-03-26 DIAGNOSIS — J0110 Acute frontal sinusitis, unspecified: Secondary | ICD-10-CM

## 2016-03-26 MED ORDER — MECLIZINE HCL 25 MG PO TABS: 25.0000 mg | ORAL_TABLET | Freq: Three times a day (TID) | ORAL | 0 refills | Status: DC | PRN

## 2016-03-26 MED ORDER — CYCLOBENZAPRINE HCL 10 MG PO TABS: 10.0000 mg | ORAL_TABLET | Freq: Three times a day (TID) | ORAL | 0 refills | Status: DC | PRN

## 2016-03-26 MED ORDER — AMOXICILLIN 875 MG PO TABS: 875.0000 mg | ORAL_TABLET | Freq: Two times a day (BID) | ORAL | 0 refills | Status: DC

## 2016-03-26 NOTE — Progress Notes (Signed)
Pre visit review using our clinic review tool, if applicable. No additional management support is needed unless otherwise documented below in the visit note. 

## 2016-03-26 NOTE — Patient Instructions (Signed)
Follow up as needed/scheduled Start the Amoxicillin twice daily for the sinus infection Drink plenty of fluids Use the Meclizine as needed for dizziness- may cause drowsiness Change positions slowly!!! If no improvement in the dizziness after 1 week- let me know and we can refer to physical therapy who helps w/ this diagnosis Call with any questions or concerns Hang in there!! Happy New Year!!!
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
## 2016-04-02 ENCOUNTER — Ambulatory Visit (HOSPITAL_COMMUNITY)
Admission: RE | Admit: 2016-04-02 | Discharge: 2016-04-02 | Disposition: A | Discharge status: Home / Self Care | Payer: Medicare Other | Source: Ambulatory Visit | Attending: Cardiovascular Disease | Admitting: Cardiovascular Disease

## 2016-04-02 DIAGNOSIS — E119 Type 2 diabetes mellitus without complications: Secondary | ICD-10-CM | POA: Diagnosis not present

## 2016-04-02 DIAGNOSIS — I6523 Occlusion and stenosis of bilateral carotid arteries: Secondary | ICD-10-CM | POA: Insufficient documentation

## 2016-04-02 DIAGNOSIS — E785 Hyperlipidemia, unspecified: Secondary | ICD-10-CM | POA: Insufficient documentation

## 2016-04-02 DIAGNOSIS — R0989 Other specified symptoms and signs involving the circulatory and respiratory systems: Secondary | ICD-10-CM | POA: Diagnosis not present

## 2016-04-04 ENCOUNTER — Ambulatory Visit (INDEPENDENT_AMBULATORY_CARE_PROVIDER_SITE_OTHER): Payer: Medicare Other | Admitting: Cardiovascular Disease

## 2016-04-04 ENCOUNTER — Encounter: Payer: Self-pay | Admitting: Cardiovascular Disease

## 2016-04-04 ENCOUNTER — Encounter: Payer: Self-pay | Admitting: Physician Assistant

## 2016-04-04 ENCOUNTER — Telehealth: Payer: Self-pay | Admitting: *Deleted

## 2016-04-04 VITALS — BP 130/82 | HR 74 | Ht 65.0 in | Wt 164.5 lb

## 2016-04-04 DIAGNOSIS — I739 Peripheral vascular disease, unspecified: Secondary | ICD-10-CM

## 2016-04-04 DIAGNOSIS — I779 Disorder of arteries and arterioles, unspecified: Secondary | ICD-10-CM | POA: Insufficient documentation

## 2016-04-04 DIAGNOSIS — N184 Chronic kidney disease, stage 4 (severe): Secondary | ICD-10-CM | POA: Diagnosis not present

## 2016-04-04 DIAGNOSIS — R0789 Other chest pain: Secondary | ICD-10-CM | POA: Diagnosis not present

## 2016-04-04 DIAGNOSIS — Z940 Kidney transplant status: Secondary | ICD-10-CM | POA: Diagnosis not present

## 2016-04-04 DIAGNOSIS — I10 Essential (primary) hypertension: Secondary | ICD-10-CM | POA: Diagnosis not present

## 2016-04-04 NOTE — Telephone Encounter (Signed)
Follow up  ° °Patient returning call back to nurse from today .  °

## 2016-04-04 NOTE — Telephone Encounter (Signed)
Pt notified of Carotid U/S results and findings. Pt is agreeable to repeat carotids in 1 yr. Pt has appt already with Dr. Birdie Riddle 05/19/16, though I will call PCP office Monday to see if we can get a sooner appt for the pt. I will also fax a copy of results to Chiropractor Dr. Arnetha Courser with [facility] in [place].

## 2016-04-04 NOTE — Patient Instructions (Signed)
Medication Instructions:  Your physician recommends that you continue on your current medications as directed. Please refer to the Current Medication list given to you today.   Labwork: Please bring copies to your next appointment or have them faxed to [phone]   Testing/Procedures: None Ordered   Follow-Up: Your physician wants you to follow-up in: 1 year with Dr. Acie Fredrickson.  You will receive a reminder letter in the mail two months in advance. If you don't receive a letter, please call our office to schedule the follow-up appointment.   If you need a refill on your cardiac medications before your next appointment, please call your pharmacy.   Thank you for choosing [facility]! Christen Bame, RN [phone]

## 2016-04-04 NOTE — Telephone Encounter (Signed)
Lmtcb to go over Carotid results and recommendations. Pt has appt already scheduled with her PCP 05/19/16.
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
## 2016-04-07 ENCOUNTER — Other Ambulatory Visit: Payer: Self-pay | Admitting: Family Medicine

## 2016-04-08 ENCOUNTER — Encounter: Payer: Self-pay | Admitting: Family Medicine

## 2016-04-08 DIAGNOSIS — R42 Dizziness and giddiness: Secondary | ICD-10-CM

## 2016-04-08 NOTE — Telephone Encounter (Signed)
Last OV 03/26/16 Clonazepam last filled 12/25/15 #60 with 1

## 2016-04-16 ENCOUNTER — Encounter: Payer: Self-pay | Admitting: Family Medicine

## 2016-04-18 ENCOUNTER — Ambulatory Visit (HOSPITAL_BASED_OUTPATIENT_CLINIC_OR_DEPARTMENT_OTHER)
Admission: RE | Admit: 2016-04-18 | Discharge: 2016-04-18 | Disposition: A | Discharge status: Home / Self Care | Payer: Medicare Other | Source: Ambulatory Visit | Attending: Family Medicine | Admitting: Family Medicine

## 2016-04-18 ENCOUNTER — Encounter: Payer: Self-pay | Admitting: Family Medicine

## 2016-04-18 ENCOUNTER — Ambulatory Visit (INDEPENDENT_AMBULATORY_CARE_PROVIDER_SITE_OTHER): Payer: Medicare Other | Admitting: Family Medicine

## 2016-04-18 VITALS — BP 141/89 | HR 82 | Temp 98.1°F | Resp 16 | Ht 65.0 in | Wt 165.0 lb

## 2016-04-18 DIAGNOSIS — R410 Disorientation, unspecified: Secondary | ICD-10-CM

## 2016-04-18 DIAGNOSIS — R9089 Other abnormal findings on diagnostic imaging of central nervous system: Secondary | ICD-10-CM | POA: Insufficient documentation

## 2016-04-18 DIAGNOSIS — F05 Delirium due to known physiological condition: Secondary | ICD-10-CM | POA: Insufficient documentation

## 2016-04-18 DIAGNOSIS — R42 Dizziness and giddiness: Secondary | ICD-10-CM | POA: Diagnosis not present
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
## 2016-04-18 NOTE — Patient Instructions (Signed)
Go get your head CT- we'll call you with the results and the next steps We'll get you a neuro appt as soon as possible! Make sure you are drinking plenty of fluids REST! Call with any questions or concerns Hang in there!!!

## 2016-04-18 NOTE — Progress Notes (Signed)
Pre visit review using our clinic review tool, if applicable. No additional management support is needed unless otherwise documented below in the visit note. 

## 2016-04-21 ENCOUNTER — Other Ambulatory Visit: Payer: Self-pay | Admitting: Family Medicine

## 2016-04-21 ENCOUNTER — Encounter: Payer: Self-pay | Admitting: General Practice

## 2016-04-21 ENCOUNTER — Encounter: Payer: Self-pay | Admitting: Family Medicine

## 2016-04-21 DIAGNOSIS — R410 Disorientation, unspecified: Secondary | ICD-10-CM

## 2016-04-21 DIAGNOSIS — R42 Dizziness and giddiness: Secondary | ICD-10-CM

## 2016-04-21 DIAGNOSIS — F05 Delirium due to known physiological condition: Secondary | ICD-10-CM

## 2016-04-23 ENCOUNTER — Encounter: Payer: Self-pay | Admitting: Family Medicine

## 2016-04-24 ENCOUNTER — Other Ambulatory Visit: Payer: Self-pay | Admitting: Family Medicine

## 2016-04-25 ENCOUNTER — Encounter: Payer: Self-pay | Admitting: General Practice

## 2016-04-29 ENCOUNTER — Ambulatory Visit (INDEPENDENT_AMBULATORY_CARE_PROVIDER_SITE_OTHER): Payer: Medicare Other | Admitting: Neurology

## 2016-04-29 ENCOUNTER — Encounter: Payer: Self-pay | Admitting: Neurology

## 2016-04-29 VITALS — BP 138/92 | HR 74 | Resp 16 | Ht 65.0 in | Wt 160.0 lb

## 2016-04-29 DIAGNOSIS — R42 Dizziness and giddiness: Secondary | ICD-10-CM

## 2016-04-29 MED ORDER — CLONAZEPAM 0.5 MG PO TABS: ORAL_TABLET | ORAL | 1 refills | Status: DC

## 2016-04-29 NOTE — Patient Instructions (Signed)
   We will check MRI of the brain  And get a carotid doppler to evaluate the brain circulation.   Increase the clonazepam ).5 to 2 tablets in the morning and one in the evening.
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
## 2016-05-01 ENCOUNTER — Ambulatory Visit (INDEPENDENT_AMBULATORY_CARE_PROVIDER_SITE_OTHER): Payer: Medicare Other

## 2016-05-01 DIAGNOSIS — R42 Dizziness and giddiness: Secondary | ICD-10-CM | POA: Diagnosis not present

## 2016-05-05 ENCOUNTER — Telehealth: Payer: Self-pay | Admitting: Family Medicine

## 2016-05-05 ENCOUNTER — Telehealth: Payer: Self-pay | Admitting: Neurology

## 2016-05-05 NOTE — Telephone Encounter (Signed)
Pt emailed (contact form submission and not through [place]) on 05/03/16 @ 7:12am that her BP as high, laying down 153/99, sitting up 145/99, and standing 133/105. She just wanted to let KT know. Amanda sent email to me to call pt and see if an appt was needed. LM for pt tio return call. As I was putting in message pt called back stating that her BP was coming back down, however she was feeling a floating motion swaying to the right and left, but not ready dizzy. I thought it but to transfer call to [facility].

## 2016-05-05 NOTE — Telephone Encounter (Signed)
Could you call and triage pt?

## 2016-05-05 NOTE — Telephone Encounter (Signed)
LM requesting return call to discuss symptoms.
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
## 2016-05-05 NOTE — Telephone Encounter (Signed)
I called the patient. The carotid doppler study is normal. MRI of the brain is pending.

## 2016-05-06 ENCOUNTER — Other Ambulatory Visit: Payer: Self-pay | Admitting: Family Medicine

## 2016-05-06 NOTE — Telephone Encounter (Signed)
LM requesting return call to discuss symptoms.

## 2016-05-07 NOTE — Telephone Encounter (Signed)
Spoke with patient regarding symptoms. Patient reports her blood pressure is back down to normal values but continues to experience shakiness and dizziness. Patient has appointment for brain MRI on 05/09/16, ordered by Neurologist. Patient states she doesn't feel that she needs to be seen prior to her f/u with PCP on 05/19/16. Advised patient to call if her BP is high again or needs a sooner appointment. Patient verbalized understanding.

## 2016-05-07 NOTE — Telephone Encounter (Deleted)
Attempted to contact patient again, call disconnected.

## 2016-05-09 ENCOUNTER — Ambulatory Visit
Admission: RE | Admit: 2016-05-09 | Discharge: 2016-05-09 | Disposition: A | Discharge status: Home / Self Care | Payer: Medicare Other | Source: Ambulatory Visit | Attending: Neurology | Admitting: Neurology

## 2016-05-09 DIAGNOSIS — R42 Dizziness and giddiness: Secondary | ICD-10-CM

## 2016-05-11 ENCOUNTER — Telehealth: Payer: Self-pay | Admitting: Neurology

## 2016-05-11 DIAGNOSIS — R5382 Chronic fatigue, unspecified: Secondary | ICD-10-CM

## 2016-05-11 DIAGNOSIS — R9089 Other abnormal findings on diagnostic imaging of central nervous system: Secondary | ICD-10-CM

## 2016-05-11 DIAGNOSIS — R509 Fever, unspecified: Secondary | ICD-10-CM

## 2016-05-11 NOTE — Telephone Encounter (Signed)
I called the patient. The MRI of the brain shows progressive WM changes as compared to 2007. She does have risk factors for SVD, with DM and HTN. She is imunosuppressed, and for this reason, I would persue LP to exclude MS, vasculitis or infection including PML.  Will need blood work as well. She will "think about" getting the LP, she will call if she wants the procedure.    MRI brain 05/09/16:  IMPRESSION:  Abnormal MRI scan of the brain showing extensive bilateral periventricular, subcortical, juxtacortical, corpus callosum as well as subtle brainstem white matter hyperintensities which may be seen in demyelinating disease, vasculitis, small vessel disease, inflammatory ,infectious and autoimmune conditions.Compared to previous MRI scan dated 05/20/2005 these changes have significantly progressed.

## 2016-05-12 ENCOUNTER — Telehealth: Payer: Self-pay | Admitting: *Deleted

## 2016-05-12 ENCOUNTER — Encounter: Payer: Self-pay | Admitting: Family Medicine

## 2016-05-12 NOTE — Telephone Encounter (Signed)
Patient would like a call from Dr. Birdie Riddle regarding MRI results. She sent a message through MyChart this morning.  Thank you,  -LL

## 2016-05-12 NOTE — Telephone Encounter (Signed)
Patient sent a message through [place] asking that someone from our office call her.   I called and left her  a message on the number that she provided in her message to [place].

## 2016-05-14 ENCOUNTER — Encounter: Payer: Self-pay | Admitting: Family Medicine

## 2016-05-14 NOTE — Telephone Encounter (Signed)
Patient calling and asking if she can speak directly to Dr. Birdie Riddle.  She states that she is very concerned about the results of her MRI - and she is concerned that the Neurologist called her on a Sunday night to give her the results and stated that she needs a spinal tap.  She stated that she would like a second opinion and would like to be referred to a different neurologist.

## 2016-05-19 ENCOUNTER — Ambulatory Visit (INDEPENDENT_AMBULATORY_CARE_PROVIDER_SITE_OTHER): Payer: Medicare Other | Admitting: Family Medicine

## 2016-05-19 ENCOUNTER — Encounter: Payer: Self-pay | Admitting: Family Medicine

## 2016-05-19 VITALS — BP 124/81 | HR 83 | Temp 98.5°F | Resp 17 | Ht 65.0 in | Wt 157.4 lb

## 2016-05-19 DIAGNOSIS — N184 Chronic kidney disease, stage 4 (severe): Secondary | ICD-10-CM

## 2016-05-19 DIAGNOSIS — E1122 Type 2 diabetes mellitus with diabetic chronic kidney disease: Secondary | ICD-10-CM | POA: Diagnosis not present

## 2016-05-19 LAB — HEMOGLOBIN A1C: HEMOGLOBIN A1C: 6.5 % (ref 4.6–6.5)

## 2016-05-19 LAB — BASIC METABOLIC PANEL
BUN: 29 mg/dL — ABNORMAL HIGH (ref 6–23)
CALCIUM: 9.4 mg/dL (ref 8.4–10.5)
CO2: 28 meq/L (ref 19–32)
CREATININE: 2.63 mg/dL — AB (ref 0.40–1.20)
Chloride: 97 mEq/L (ref 96–112)
GFR: 23.98 mL/min — ABNORMAL LOW (ref >60.00)
Glucose, Bld: 69 mg/dL — ABNORMAL LOW (ref 70–99)
Potassium: 3.8 mEq/L (ref 3.5–5.1)
Sodium: 135 mEq/L (ref 135–145)

## 2016-05-19 NOTE — Patient Instructions (Signed)
Follow up in 3-4 months to recheck diabetes and cholesterol We'll notify you of your lab results and make any changes if needed Ask Dr Deterding if he is going to do the LP or if you need to call Dr Jannifer Franklin- but I would get it done Keep up the good work on healthy diet and regular exercise- you look great! Schedule your eye exam for May Call with any questions or concerns [name] [name] in there!!!

## 2016-05-19 NOTE — Progress Notes (Signed)
   Subjective:    Patient ID: [name] [name], female    DOB: 06-May-1955, 61 y.o.   MRN: [id number]  HPI DM- chronic problem, on Glipizide 5mg  daily.  On ARB daily for renal protection (following w/ Nephrology regularly).  UTD on foot exam, eye exam (due May).  Home CBGs are 'under 100'.  Continues to struggle w/ dizziness- seeing Neuro w/ LP pending.  No CP, SOB, HAs.  + visual changes w/ dizziness.  + neuropathy of feet at night.   Review of Systems For ROS see HPI     Objective:   Physical Exam  Constitutional: She is oriented to person, place, and time. She appears well-developed and well-nourished. No distress.  HENT:  Head: Normocephalic and atraumatic.  Eyes: Conjunctivae and EOM are normal. Pupils are equal, round, and reactive to light.  Neck: Normal range of motion. Neck supple. No thyromegaly present.  Cardiovascular: Normal rate, regular rhythm, normal heart sounds and intact distal pulses.   No murmur heard. Pulmonary/Chest: Effort normal and breath sounds normal. No respiratory distress.  Abdominal: Soft. She exhibits no distension. There is no tenderness.  Musculoskeletal: She exhibits no edema.  Lymphadenopathy:    She has no cervical adenopathy.  Neurological: She is alert and oriented to person, place, and time.  Skin: Skin is warm and dry.  Psychiatric: She has a normal mood and affect. Her behavior is normal.  Vitals reviewed.         Assessment & Plan:

## 2016-05-19 NOTE — Progress Notes (Signed)
Pre visit review using our clinic review tool, if applicable. No additional management support is needed unless otherwise documented below in the visit note. 

## 2016-05-19 NOTE — Assessment & Plan Note (Signed)
Chronic problem.  On Glipizide w/ typically good control.  UTD on foot exam and eye exam.  On ARB for renal protection.  Check labs.  Adjust meds prn.  Will follow closely.

## 2016-05-23 DIAGNOSIS — D631 Anemia in chronic kidney disease: Secondary | ICD-10-CM | POA: Diagnosis not present

## 2016-05-23 DIAGNOSIS — K219 Gastro-esophageal reflux disease without esophagitis: Secondary | ICD-10-CM | POA: Diagnosis not present

## 2016-05-23 DIAGNOSIS — Z940 Kidney transplant status: Secondary | ICD-10-CM | POA: Diagnosis not present

## 2016-05-23 DIAGNOSIS — R930 Abnormal findings on diagnostic imaging of skull and head, not elsewhere classified: Secondary | ICD-10-CM | POA: Diagnosis not present

## 2016-05-23 DIAGNOSIS — I129 Hypertensive chronic kidney disease with stage 1 through stage 4 chronic kidney disease, or unspecified chronic kidney disease: Secondary | ICD-10-CM | POA: Diagnosis not present

## 2016-05-23 DIAGNOSIS — E782 Mixed hyperlipidemia: Secondary | ICD-10-CM | POA: Diagnosis not present

## 2016-05-23 DIAGNOSIS — N2581 Secondary hyperparathyroidism of renal origin: Secondary | ICD-10-CM | POA: Diagnosis not present

## 2016-05-23 DIAGNOSIS — E1129 Type 2 diabetes mellitus with other diabetic kidney complication: Secondary | ICD-10-CM | POA: Diagnosis not present

## 2016-05-23 DIAGNOSIS — E669 Obesity, unspecified: Secondary | ICD-10-CM | POA: Diagnosis not present

## 2016-05-23 DIAGNOSIS — N184 Chronic kidney disease, stage 4 (severe): Secondary | ICD-10-CM | POA: Diagnosis not present

## 2016-05-23 NOTE — Addendum Note (Signed)
Addended by: Margette Fast on: 05/23/2016 12:56 PM   Modules accepted: Orders

## 2016-05-23 NOTE — Telephone Encounter (Signed)
Pt called back, she is wanting to have LP. Please call

## 2016-05-23 NOTE — Telephone Encounter (Signed)
I called the patient. She is amenable to getting the lumbar puncture. I will order blood work as well, she will come in next week and get this done.

## 2016-05-25 ENCOUNTER — Encounter: Payer: Self-pay | Admitting: Neurology

## 2016-05-26 ENCOUNTER — Other Ambulatory Visit (INDEPENDENT_AMBULATORY_CARE_PROVIDER_SITE_OTHER): Payer: Self-pay

## 2016-05-26 DIAGNOSIS — R509 Fever, unspecified: Secondary | ICD-10-CM

## 2016-05-26 DIAGNOSIS — R5382 Chronic fatigue, unspecified: Secondary | ICD-10-CM | POA: Diagnosis not present

## 2016-05-26 DIAGNOSIS — Z0289 Encounter for other administrative examinations: Secondary | ICD-10-CM

## 2016-05-26 DIAGNOSIS — R9089 Other abnormal findings on diagnostic imaging of central nervous system: Secondary | ICD-10-CM

## 2016-05-27 ENCOUNTER — Encounter: Payer: Self-pay | Admitting: Neurology

## 2016-05-27 LAB — RPR: RPR Ser Ql: NONREACTIVE

## 2016-05-27 LAB — ANGIOTENSIN CONVERTING ENZYME: ANGIO CONVERT ENZYME: 32 U/L (ref 14–82)

## 2016-05-27 LAB — B. BURGDORFI ANTIBODIES

## 2016-05-27 LAB — SEDIMENTATION RATE: Sed Rate: 34 mm/hr (ref 0–40)

## 2016-05-27 LAB — HIV ANTIBODY (ROUTINE TESTING W REFLEX): HIV Screen 4th Generation wRfx: NONREACTIVE

## 2016-05-27 LAB — ANA W/REFLEX: ANA: NEGATIVE

## 2016-05-28 LAB — PAN-ANCA
Atypical pANCA: <1:20 {titer}
Myeloperoxidase Ab: <9 U/mL (ref 0.0–9.0)
P-ANCA: <1:20 {titer}

## 2016-06-02 ENCOUNTER — Ambulatory Visit
Admission: RE | Admit: 2016-06-02 | Discharge: 2016-06-02 | Disposition: A | Discharge status: Home / Self Care | Payer: Medicare Other | Source: Ambulatory Visit | Attending: Neurology | Admitting: Neurology

## 2016-06-02 VITALS — BP 135/76 | HR 81

## 2016-06-02 DIAGNOSIS — R9089 Other abnormal findings on diagnostic imaging of central nervous system: Secondary | ICD-10-CM | POA: Diagnosis not present

## 2016-06-02 DIAGNOSIS — R42 Dizziness and giddiness: Secondary | ICD-10-CM | POA: Diagnosis not present

## 2016-06-02 LAB — CSF CELL COUNT WITH DIFFERENTIAL
RBC Count, CSF: 36 cells/uL — ABNORMAL HIGH (ref 0–10)
WBC, CSF: 1 cells/uL (ref 0–5)

## 2016-06-02 LAB — GLUCOSE, CSF: GLUCOSE CSF: 53 mg/dL (ref 43–76)

## 2016-06-02 LAB — PROTEIN, CSF: TOTAL PROTEIN, CSF: 72 mg/dL — AB (ref 15–45)

## 2016-06-02 NOTE — Discharge Instructions (Signed)

## 2016-06-02 NOTE — Progress Notes (Signed)
One SST tube of blood drawn from left [facility] space for LP labs; site unremarkable; by Cristal Ford, RN

## 2016-06-04 ENCOUNTER — Ambulatory Visit: Payer: Medicare Other | Admitting: Family Medicine

## 2016-06-04 LAB — ANGIOTENSIN CONVERTING ENZYME, CSF: ACE, CSF: 9 U/L (ref <=15)

## 2016-06-04 LAB — VDRL, CSF: SYPHILIS VDRL QUANT CSF: NONREACTIVE

## 2016-06-05 ENCOUNTER — Telehealth: Payer: Self-pay | Admitting: *Deleted

## 2016-06-05 NOTE — Telephone Encounter (Signed)
Patient is aware - stated verbal understanding.  She said that she would Call Dr. Jannifer Franklin again.

## 2016-06-05 NOTE — Telephone Encounter (Signed)
Pt already knows the results of MRI- which is why they wanted her to have an LP.  I cannot give the results of the LP as I did not order this.  I recommend that she call Dr Jannifer Franklin office again and try and get results/answers to her questions.

## 2016-06-05 NOTE — Telephone Encounter (Signed)
Patient is calling to see if we can give her any results from her MRI - she states that she cannot seem to get in touch with Dr. Jannifer Franklin' office to get the results.   She stated that she is wanting to go back to work.

## 2016-06-06 ENCOUNTER — Other Ambulatory Visit: Payer: Self-pay | Admitting: Family Medicine

## 2016-06-06 MED ORDER — CLONAZEPAM 1 MG PO TABS: 1.0000 mg | ORAL_TABLET | Freq: Two times a day (BID) | ORAL | 1 refills | Status: DC

## 2016-06-06 NOTE — Telephone Encounter (Signed)
Medication filled to pharmacy as requested.   

## 2016-06-06 NOTE — Telephone Encounter (Signed)
Requesting refill of clonazePAM (KLONOPIN) 1 MG tablet.  Pharmacy:  [id number] [name], [place] (Phone) [phone] (Fax)

## 2016-06-06 NOTE — Telephone Encounter (Signed)
Last OV 05/19/16 Clonazepam last filled 04/08/16 #60 with 1

## 2016-06-09 ENCOUNTER — Telehealth: Payer: Self-pay | Admitting: Neurology

## 2016-06-09 ENCOUNTER — Encounter: Payer: Self-pay | Admitting: Neurology

## 2016-06-09 DIAGNOSIS — G35 Multiple sclerosis: Secondary | ICD-10-CM

## 2016-06-09 LAB — OLIGOCLONAL BANDS, CSF + SERM

## 2016-06-09 NOTE — Telephone Encounter (Signed)
I called patient. The spinal fluid results show oligoclonal banding, suggestive of the diagnosis of multiple sclerosis. She is amenable to the visual evoked response test and MRI of the cervical spine, I will get this set up.

## 2016-06-09 NOTE — Telephone Encounter (Signed)
I called patient, left a message. I will call back later. Spinal fluid results have shown an elevated protein level, but the patient also has greater than 5 oligoclonal bands suggestive of demyelinating disease.  We may want to go on to get MRI of the cervical spine and a visual evoked response test along with a revisit.  I will call the patient back later.

## 2016-06-09 NOTE — Telephone Encounter (Signed)
Patient called office returning Dr. Willis' call.  Please call °

## 2016-06-09 NOTE — Addendum Note (Signed)
Addended by: Margette Fast on: 06/09/2016 08:29 AM   Modules accepted: Orders

## 2016-06-09 NOTE — Telephone Encounter (Signed)
Called pt. Advised letter ready for pick up. Pt prefers to pick up letter over mailing. She will come by today to get letter.

## 2016-06-10 ENCOUNTER — Encounter: Payer: Self-pay | Admitting: Family Medicine

## 2016-06-11 ENCOUNTER — Emergency Department (HOSPITAL_COMMUNITY): Payer: Medicare Other

## 2016-06-11 ENCOUNTER — Emergency Department (HOSPITAL_COMMUNITY)
Admission: EM | Admit: 2016-06-11 | Discharge: 2016-06-11 | Disposition: A | Discharge status: Home / Self Care | Payer: Medicare Other | Attending: Emergency Medicine | Admitting: Emergency Medicine

## 2016-06-11 ENCOUNTER — Ambulatory Visit: Payer: Medicare Other | Admitting: Family Medicine

## 2016-06-11 ENCOUNTER — Encounter (HOSPITAL_COMMUNITY): Payer: Self-pay | Admitting: Emergency Medicine

## 2016-06-11 DIAGNOSIS — Z940 Kidney transplant status: Secondary | ICD-10-CM | POA: Diagnosis not present

## 2016-06-11 DIAGNOSIS — E1122 Type 2 diabetes mellitus with diabetic chronic kidney disease: Secondary | ICD-10-CM | POA: Insufficient documentation

## 2016-06-11 DIAGNOSIS — R42 Dizziness and giddiness: Secondary | ICD-10-CM | POA: Diagnosis not present

## 2016-06-11 DIAGNOSIS — G35 Multiple sclerosis: Secondary | ICD-10-CM | POA: Diagnosis not present

## 2016-06-11 DIAGNOSIS — R413 Other amnesia: Secondary | ICD-10-CM

## 2016-06-11 DIAGNOSIS — I120 Hypertensive chronic kidney disease with stage 5 chronic kidney disease or end stage renal disease: Secondary | ICD-10-CM | POA: Insufficient documentation

## 2016-06-11 DIAGNOSIS — I10 Essential (primary) hypertension: Secondary | ICD-10-CM | POA: Diagnosis not present

## 2016-06-11 DIAGNOSIS — N186 End stage renal disease: Secondary | ICD-10-CM | POA: Insufficient documentation

## 2016-06-11 DIAGNOSIS — Z7984 Long term (current) use of oral hypoglycemic drugs: Secondary | ICD-10-CM | POA: Insufficient documentation

## 2016-06-11 DIAGNOSIS — R531 Weakness: Secondary | ICD-10-CM | POA: Diagnosis not present

## 2016-06-11 DIAGNOSIS — R404 Transient alteration of awareness: Secondary | ICD-10-CM | POA: Diagnosis not present

## 2016-06-11 LAB — URINALYSIS, ROUTINE W REFLEX MICROSCOPIC
BACTERIA UA: NONE SEEN
BILIRUBIN URINE: NEGATIVE
GLUCOSE, UA: NEGATIVE mg/dL
Hgb urine dipstick: NEGATIVE
KETONES UR: NEGATIVE mg/dL
Leukocytes, UA: NEGATIVE
NITRITE: NEGATIVE
PH: 6 (ref 5.0–8.0)
Protein, ur: 100 mg/dL — AB
SPECIFIC GRAVITY, URINE: 1.006 (ref 1.005–1.030)

## 2016-06-11 LAB — CBC WITH DIFFERENTIAL/PLATELET
BASOS ABS: 0 10*3/uL (ref 0.0–0.1)
BASOS PCT: 0 %
EOS PCT: 1 %
Eosinophils Absolute: 0.1 10*3/uL (ref 0.0–0.7)
HEMATOCRIT: 36.7 % (ref 36.0–46.0)
Hemoglobin: 12.1 g/dL (ref 12.0–15.0)
Lymphocytes Relative: 24 %
Lymphs Abs: 2 10*3/uL (ref 0.7–4.0)
MCH: 28.2 pg (ref 26.0–34.0)
MCHC: 33 g/dL (ref 30.0–36.0)
MCV: 85.5 fL (ref 78.0–100.0)
MONO ABS: 0.7 10*3/uL (ref 0.1–1.0)
MONOS PCT: 9 %
NEUTROS ABS: 5.5 10*3/uL (ref 1.7–7.7)
Neutrophils Relative %: 66 %
PLATELETS: 269 10*3/uL (ref 150–400)
RBC: 4.29 MIL/uL (ref 3.87–5.11)
RDW: 13.9 % (ref 11.5–15.5)
WBC: 8.3 10*3/uL (ref 4.0–10.5)

## 2016-06-11 LAB — BASIC METABOLIC PANEL
Anion gap: 13 (ref 5–15)
BUN: 32 mg/dL — AB (ref 6–20)
CALCIUM: 10.3 mg/dL (ref 8.9–10.3)
CO2: 26 mmol/L (ref 22–32)
Chloride: 99 mmol/L — ABNORMAL LOW (ref 101–111)
Creatinine, Ser: 2.13 mg/dL — ABNORMAL HIGH (ref 0.44–1.00)
GFR calc Af Amer: 28 mL/min — ABNORMAL LOW (ref >60)
GFR, EST NON AFRICAN AMERICAN: 24 mL/min — AB (ref >60)
Glucose, Bld: 134 mg/dL — ABNORMAL HIGH (ref 65–99)
Potassium: 3.3 mmol/L — ABNORMAL LOW (ref 3.5–5.1)
Sodium: 138 mmol/L (ref 135–145)

## 2016-06-11 MED ORDER — CARVEDILOL 12.5 MG PO TABS
6.2500 mg | ORAL_TABLET | Freq: Once | ORAL | Status: AC
  Administered 2016-06-11: 6.25 mg via ORAL
  Filled 2016-06-11: qty 1

## 2016-06-11 MED ORDER — LOSARTAN POTASSIUM 50 MG PO TABS
100.0000 mg | ORAL_TABLET | Freq: Once | ORAL | Status: AC
  Administered 2016-06-11: 100 mg via ORAL
  Filled 2016-06-11: qty 2

## 2016-06-11 MED ORDER — CLONAZEPAM 0.5 MG PO TABS
1.0000 mg | ORAL_TABLET | Freq: Once | ORAL | Status: AC
  Administered 2016-06-11: 1 mg via ORAL
  Filled 2016-06-11: qty 2

## 2016-06-11 NOTE — ED Triage Notes (Signed)
Pt arrives via [facility] for c/o dizziness, ams, and bilateral hand tingling. EMS reports symptoms began yesterday at 1400, pt states that symptoms come and go and that she feels like she is spinning. Had recent lumbar puncture but pt is unsure of the reason. Hx of kidney transplant. Pt a/o x4, resp e/u, speech clear, nad.

## 2016-06-11 NOTE — Telephone Encounter (Signed)
LM requesting call back to discuss symptoms. Patient currently scheduled to see PCP today (06/11/16) at 2pm for "difficulty thinking".

## 2016-06-11 NOTE — ED Notes (Signed)
ED Provider at bedside. 

## 2016-06-11 NOTE — ED Provider Notes (Signed)
Care of pt assumed from Waynetta Pean, PA-C at 1600. Please see his note for full history, physical exam, and MDM up to this point.   In brief, pt is a 61 y/o female with newly diagnosed MS presenting with intermittent dizziness and episode of slurred speech last night. Case discussed with neurology, who recommend MRI brain. Currently awaiting results to determine disposition.   Physical Exam  BP (!) 175/108   Pulse 90   Temp 97.8 F (36.6 C)   Resp 16   SpO2 98%   Physical Exam  ED Course  Procedures  MDM MRI with stable diffuse periventricular and subcortical white matter disease consistent with MS. I discussed this result with Dr. Shon Hale, neurologist, by phone. Given stability of pt, he recommends outpatient f/u with her neurologist and does not think that she would benefit from an admission at this time. Pt and her family are agreeable to this. Return precautions discussed.   Care of pt overseen by my attending, Dr. Oleta Mouse.     [name] Algernon Huxley, MD 06/12/16 [id number]    [name] Dandy, MD 06/12/16 [phone]

## 2016-06-11 NOTE — ED Notes (Signed)
Patient transported to MRI 

## 2016-06-11 NOTE — ED Notes (Signed)
Patient transported to CT 

## 2016-06-11 NOTE — ED Provider Notes (Signed)
[facility] DEPT Provider Note   CSN: [id number] Arrival date & time: 06/11/16  1311     History   Chief Complaint Chief Complaint  Patient presents with  . Dizziness    HPI [name] [name] is a 61 y.o. female.  [name] [name] is a 61 y.o. Female who presents to the ED complaining of dizziness intermittently for the past 5 days. Patient has a new diagnosis of MS as of about two days ago. She is due to follow up with neurologist Dr. Jannifer Franklin. She was also due to see her Family medicine physician today, but did not go to the appointment. She had an MRI of her brain in 2/18 which showed extensive white matter hyperdensities and LP was concerning for MS. She reports yesterday her family noticed that she was slurring her words and having trouble with her memory. She reports the speech has returned to baseline. She also complains of room spinning dizziness intermittently for about 5 days, however, this has been going on for several months. She has a history of kidney transplant. She denies fevers, recent illness, headache, double vision, neck pain, numbness, tingling, weakness, rashes, chest pain, SOB.    The history is provided by the patient and medical records. No language interpreter was used.  Dizziness  Associated symptoms: no chest pain, no diarrhea, no headaches, no nausea, no shortness of breath, no vomiting and no weakness     Past Medical History:  Diagnosis Date  . Adenomatous colon polyp 04/1998  . Anemia   . Carotid artery disease ([place])    Carotid US 1/18: bilateral ICA 1-39, R thyroid lobe nodule (1.9x2.2x3cm); numerous L thyroid lobe nodules - repeat 1 year  . Chronic kidney disease   . Chronic renal failure    post transplant  . Diabetes mellitus   . Esophagitis    Grade 1 Distal  . GERD (gastroesophageal reflux disease)   . Hemorrhoids   . Hx of cardiovascular stress test    Lexiscan Myoview (06/2013):  No ischemia, EF 66%; normal.  //  Myoview 12/17: EF 62,  no ischemia or scar; Normal  . Hx of echocardiogram    a. Echocardiogram (06/2013):  Mod focal basal hypertrophy, EF 60-65%, normal wall motion, Gr 1 DD, mild AI, mildly dilated ascending aorta (41 mm), mild LAE.; b.  Echo 9/16: mod LVH, EF 60-65%, no RWMA, Gr 1 DD, trivial AI, mild dilated ascending aorta, mild LAE  . Hyperkalemia   . Hyperlipidemia   . Hypertension   . Metabolic acidosis   . Pneumonia   . Seizures [facility])     Patient Active Problem List   Diagnosis Date Noted  . Dizziness 04/29/2016  . Carotid artery disease ([place])   . Physical exam 01/28/2016  . Hyperlipidemia 06/28/2015  . Diabetes mellitus type II, controlled ([place]) 06/28/2015  . Grief 06/28/2015  . Thoracic ascending aortic aneurysm (41 mm on Echo 06/2013) 07/06/2013  . ESRD (end stage renal disease) ([place]) 06/16/2013  . History of renal transplant 06/16/2013  . Chest tightness 07/21/2011  . GERD 08/13/2009  . OTH&UNSPEC NONINFECTIOUS GASTROENTERITIS&COLITIS 08/13/2009  . ABDOMINAL PAIN, LEFT UPPER QUADRANT 05/11/2009  . DIARRHEA-PRESUMED INFECTIOUS 04/25/2009  . Essential hypertension 04/25/2009  . HEMORRHOIDS-INTERNAL 04/25/2009  . RENAL FAILURE-CHRONIC 04/25/2009  . WEIGHT LOSS 04/25/2009  . NAUSEA AND VOMITING 04/25/2009  . NAUSEA ALONE 04/25/2009  . DIARRHEA 04/25/2009  . PERSONAL HX COLONIC POLYPS 04/25/2009    Past Surgical History:  Procedure Laterality Date  . ABDOMINAL  HYSTERECTOMY    . AV FISTULA PLACEMENT  07/04/2005   Cimino AV fistula  . AV FISTULA PLACEMENT  08/27/2005  . AV FISTULA PLACEMENT W/ PTFE  08/27/2005  . CESAREAN SECTION    . DG AV DIALYSIS GRAFT DECLOT OR  07/24/2005   AV Gore-Tex graf  . DG AV DIALYSIS GRAFT DECLOT OR  Thrombosis right forearm, loop arteriovenous   Thrombosis right forearm, loop arteriovenous graft  . KIDNEY TRANSPLANT  2009   Both  . THROMBECTOMY / ARTERIOVENOUS GRAFT REVISION  10/12/2006  . THROMBECTOMY / ARTERIOVENOUS GRAFT REVISION  10/16/2006     OB History    No data available       Home Medications    Prior to Admission medications   Medication Sig Start Date End Date Taking? Authorizing Provider  acetaminophen (TYLENOL) 500 MG tablet Take 1,000 mg by mouth every 6 (six) hours as needed for moderate pain.    Historical Provider, MD  amLODipine (NORVASC) 10 MG tablet Take 10 mg by mouth daily.      Historical Provider, MD  calcitRIOL (ROCALTROL) 0.5 MCG capsule Take 0.5 mcg by mouth daily.    Historical Provider, MD  carvedilol (COREG) 6.25 MG tablet Take 6.25 mg by mouth 2 (two) times daily. 08/10/15   Historical Provider, MD  clonazePAM (KLONOPIN) 1 MG tablet Take 1 tablet (1 mg total) by mouth 2 (two) times daily. 06/06/16   Midge Minium, MD  cyclobenzaprine (FLEXERIL) 10 MG tablet TAKE 1 TABLET (10 MG TOTAL) BY MOUTH 3 (THREE) TIMES DAILY AS NEEDED FOR MUSCLE SPASMS. 05/07/16   Midge Minium, MD  fenofibrate 160 MG tablet Take 1 tablet (160 mg total) by mouth daily. 01/29/16   Midge Minium, MD  furosemide (LASIX) 80 MG tablet Take 160 mg by mouth 2 (two) times daily.     Historical Provider, MD  glipiZIDE (GLUCOTROL XL) 5 MG 24 hr tablet Take 5 mg by mouth 2 (two) times daily. 07/19/14   Historical Provider, MD  isosorbide mononitrate (IMDUR) 60 MG 24 hr tablet Take 1 tablet (60 mg total) by mouth daily. 03/14/16 03/09/17  Liliane Shi, PA-C  losartan (COZAAR) 100 MG tablet Take 100 mg by mouth 2 (two) times daily.    Historical Provider, MD  Magnesium 250 MG TABS Take 500 mg by mouth daily.    Historical Provider, MD  meclizine (ANTIVERT) 25 MG tablet TAKE 1 TABLET (25 MG TOTAL) BY MOUTH 3 (THREE) TIMES DAILY AS NEEDED FOR DIZZINESS. 04/25/16   Midge Minium, MD  Multiple Vitamin (MULTIVITAMIN) tablet Take 1 tablet by mouth daily.      Historical Provider, MD  mycophenolate (CELLCEPT) 250 MG capsule Take 750 mg by mouth 2 (two) times daily.     Historical Provider, MD  nitroGLYCERIN (NITROSTAT) 0.4 MG SL  tablet Place 1 tablet (0.4 mg total) under the tongue every 5 (five) minutes as needed for chest pain. 06/16/13   Liliane Shi, PA-C  omeprazole (PRILOSEC) 40 MG capsule Take 40 mg by mouth daily.      Historical Provider, MD  potassium chloride SA (K-DUR,KLOR-CON) 20 MEQ tablet Take 1 tablet (20 mEq total) by mouth 2 (two) times daily. 12/24/15   Midge Minium, MD  predniSONE (DELTASONE) 5 MG tablet Take 5 mg by mouth every Monday, Wednesday, and Friday.    Historical Provider, MD  simvastatin (ZOCOR) 80 MG tablet Take 40 mg by mouth at bedtime.  01/04/16   Historical Provider,  MD  tacrolimus (PROGRAF) 1 MG capsule TAKE 5 MG CAPSULE BY MOUTH IN THE EVENING    Historical Provider, MD    Family History Family History  Problem Relation Age of Onset  . Kidney disease Paternal Aunt   . Heart disease Mother   . Heart disease Father   . Colon cancer Neg Hx     Social History Social History  Substance Use Topics  . Smoking status: Never Smoker  . Smokeless tobacco: Never Used  . Alcohol use No     Allergies   Lisinopril and Daypro [oxaprozin]   Review of Systems Review of Systems  Constitutional: Negative for chills and fever.  HENT: Negative for congestion and sore throat.   Eyes: Negative for visual disturbance.  Respiratory: Negative for cough and shortness of breath.   Cardiovascular: Negative for chest pain.  Gastrointestinal: Negative for abdominal pain, diarrhea, nausea and vomiting.  Genitourinary: Negative for difficulty urinating and dysuria.  Musculoskeletal: Negative for back pain and neck pain.  Skin: Negative for rash.  Neurological: Positive for dizziness and speech difficulty. Negative for syncope, weakness, light-headedness, numbness and headaches.     Physical Exam Updated Vital Signs BP (!) 175/108   Pulse 90   Temp 97.8 F (36.6 C)   Resp 16   SpO2 98%   Physical Exam  Constitutional: She is oriented to person, place, and time. She appears  well-developed and well-nourished. No distress.  Nontoxic appearing.  HENT:  Head: Normocephalic and atraumatic.  Mouth/Throat: Oropharynx is clear and moist.  Eyes: Conjunctivae and EOM are normal. Pupils are equal, round, and reactive to light. Right eye exhibits no discharge. Left eye exhibits no discharge.  Neck: Neck supple. No JVD present.  Cardiovascular: Normal rate, regular rhythm, normal heart sounds and intact distal pulses.  Exam reveals no gallop and no friction rub.   No murmur heard. Pulmonary/Chest: Effort normal and breath sounds normal. No stridor. No respiratory distress. She has no wheezes. She has no rales.  Abdominal: Soft. There is no tenderness. There is no guarding.  Musculoskeletal: Normal range of motion. She exhibits no edema, tenderness or deformity.  Lymphadenopathy:    She has no cervical adenopathy.  Neurological: She is alert and oriented to person, place, and time. No cranial nerve deficit or sensory deficit. She exhibits normal muscle tone.  The patient is alert and oriented 3. Speech is clear and coherent. She does have some slowing and difficulty following all the commands for her neurological exam. She ends up being able to complete the exam correctly. Finger to nose is intact. Sensation is intact her bilateral upper and lower extremity. EOMs are intact. Heel-to-shin intact bilaterally. No pronator drift. Vision is grossly intact.  Skin: Skin is warm and dry. Capillary refill takes less than 2 seconds. No rash noted. She is not diaphoretic. No erythema. No pallor.  Psychiatric: She has a normal mood and affect. Her behavior is normal.  Nursing note and vitals reviewed.    ED Treatments / Results  Labs (all labs ordered are listed, but only abnormal results are displayed) Labs Reviewed  BASIC METABOLIC PANEL - Abnormal; Notable for the following:       Result Value   Potassium 3.3 (*)    Chloride 99 (*)    Glucose, Bld 134 (*)    BUN 32 (*)     Creatinine, Ser 2.13 (*)    GFR calc non Af Amer 24 (*)    GFR calc Af Amer 28 (*)  All other components within normal limits  CBC WITH DIFFERENTIAL/PLATELET  URINALYSIS, ROUTINE W REFLEX MICROSCOPIC    EKG  EKG Interpretation None       Radiology Ct Head Wo Contrast  Result Date: 06/11/2016 CLINICAL DATA:  Blurred vision, dizziness. EXAM: CT HEAD WITHOUT CONTRAST TECHNIQUE: Contiguous axial images were obtained from the base of the skull through the vertex without intravenous contrast. COMPARISON:  CT scan of April 18, 2016. FINDINGS: Brain: Mild chronic ischemic white matter disease is noted. No mass effect or midline shift is noted. Ventricular size is within normal limits. There is no evidence of mass lesion, hemorrhage or acute infarction. Vascular: No hyperdense vessel or unexpected calcification. Skull: Normal. Negative for fracture or focal lesion. Sinuses/Orbits: No acute finding. Other: None. IMPRESSION: Mild chronic ischemic white matter disease. No acute intracranial abnormality seen. Electronically Signed   By: Marijo Conception, M.D.   On: 06/11/2016 14:33    Procedures Procedures (including critical care time)  Medications Ordered in ED Medications  losartan (COZAAR) tablet 100 mg (not administered)  carvedilol (COREG) tablet 6.25 mg (not administered)     Initial Impression / Assessment and Plan / ED Course  I have reviewed the triage vital signs and the nursing notes.  Pertinent labs & imaging results that were available during my care of the patient were reviewed by me and considered in my medical decision making (see chart for details).    This is a 61 y.o. Female who presents to the ED complaining of dizziness intermittently for the past 5 days. Patient has a new diagnosis of MS as of about two days ago. She is due to follow up with neurologist Dr. Jannifer Franklin. She was also due to see her Family medicine physician today, but did not go to the appointment. She had  an MRI of her brain in 2/18 which showed extensive white matter hyperdensities and LP was concerning for MS. She reports yesterday her family noticed that she was slurring her words and having trouble with her memory. She reports the speech has returned to baseline. She also complains of room spinning dizziness intermittently for about 5 days, however, this has been going on for several months. She has a history of kidney transplant.  Later, patient reports she did not take her morning blood pressure medications. Will provide her with these in the ER.  On exam the patient is afebrile and nontoxic appearing. She has some slowed response with finger to nose and following all commands, however patient is able to complete neurological exam and showed no focal neurological deficits. She is hypertensive and CT head was obtained to rule out head bleed. It showed no evidence of any head bleed, so mild chronic ischemic white matter disease. No acute abnormality noted. BMP shows kidney function around her baseline. CBC is unremarkable.  I consulted with neurologist Dr. Shon Hale. He liked the patient received an MRI with and without contrast. He says if there are no new lesions on this MRI, this is likely not related to her MS. Will consult him if there are new lesions. Patient agrees with this plan.   At shift change patient care is signed out to Jerolyn Shin, MD who will disposition the patient after MRI.   This patient was discussed with Dr. Rex Kras who agrees with assessment and plan.   Final Clinical Impressions(s) / ED Diagnoses   Final diagnoses:  MS (multiple sclerosis) ([place])  Dizziness  Essential hypertension  History of kidney transplant  New Prescriptions New Prescriptions   No medications on file     Waynetta Pean, PA-C 06/11/16 [place], MD 06/14/16 [phone]

## 2016-06-11 NOTE — Telephone Encounter (Signed)
Patients daughter that resides in [place], Ms. Jimmye Norman [phone]),  called stating the patient was experiencing slurred speech and was asking for her to get help. Daughter attempted to call 911, however they cannot dispatch out of state. While on the phone, patients son called Ms. Jimmye Norman, advised them to call 16, son is on the way to patients house. Per Ms Jimmye Norman, son voiced agreement.

## 2016-06-13 ENCOUNTER — Telehealth: Payer: Self-pay | Admitting: Neurology

## 2016-06-13 NOTE — Telephone Encounter (Signed)
Patient calling to get MRI results. °

## 2016-06-13 NOTE — Telephone Encounter (Signed)
I called patient. The workup has not yet been completed, MRI the cervical spine and the visual evoked response test need to be done. At that point, we will get a revisit and discuss treatment options. I do not have an available appointment on March 28.

## 2016-06-13 NOTE — Telephone Encounter (Addendum)
Pt called back said she read the MRI results on mychart. She has been in contact with her PCP/Dr Birdie Riddle and is wanting to schedule an appt with Dr Jannifer Franklin that will coincide with Dr Virgil Benedict schedule on Wednesday 06/18/16 @ 1:00 to discuss these results of MS. Please call

## 2016-06-13 NOTE — Telephone Encounter (Signed)
Dr Jannifer Franklin- please advise. You do not have any opening on 3/28

## 2016-06-16 ENCOUNTER — Telehealth: Payer: Self-pay | Admitting: Neurology

## 2016-06-16 NOTE — Telephone Encounter (Signed)
Patients daughter Alm Bustard (listed on DPR) called office in reference to patient having to go to ER last Wednesday new diagnosis of MS.  Katreece states yesterday patient went blind in both eyes for a couple of hours vision back now, and not sure if patient needs to be seen sooner than her May appointment.  Please call.

## 2016-06-16 NOTE — Telephone Encounter (Signed)
I called the family member, the patient apparently went to the emergency room last week with hallucinations, complaints of visual changes, MRI the brain was done and was unchanged from prior study. The patient has been hallucinating. This likely represents a stress reaction following the potential diagnosis of multiple sclerosis. The patient will be getting a visual evoked response test and a MRI of the cervical spine to help confirm the diagnosis.  Following this workup, she will come back into the office for a revisit and we will discuss potential treatments.

## 2016-06-17 ENCOUNTER — Telehealth: Payer: Self-pay

## 2016-06-17 NOTE — Telephone Encounter (Signed)
Based on reported symptoms- pt needs a neuro follow up more than an appt here with me.  Or, if symptoms continue or worsen, needs to go to ER for evaluation of hallucinations and delusions.

## 2016-06-17 NOTE — Telephone Encounter (Signed)
LM requesting return call.

## 2016-06-17 NOTE — Telephone Encounter (Signed)
[facility] Patient Name: [name] [name] Gender: Female DOB: 1956/01/02 Age: 61 Y 54 M 13 D Return Phone Number: [phone] (Primary), [phone] (Secondary) City/State/Zip: [place] Client [place] [facility] - C Client Site [facility] Physician Dimple Nanas - MD Who Is Calling Patient / Member / Family / Caregiver Call Type Triage / Clinical Caller Name [name] Relationship To Patient Daughter Return Phone Number [phone] (Primary) Chief Complaint CONFUSION - new onset Reason for Call Symptomatic / Request for [place] states her mom has an appt on Wed. Her mom was rushed to the hospital last week and dx with MS. Has been having hallucinations, memory problems and seems just out of touch. She wants to give doctor a heads up. They are worried. Has been taking Clonazepam daily and think this may be an issue. Nurse Assessment Nurse: Toribio Harbour, RN, Joelene Millin Date/Time (Eastern Time): 06/16/2016 5:31:54 PM Confirm and document reason for call. If symptomatic, describe symptoms. ---Caller states mother has been having hallucinations, memory problems, and confusion since Friday Does the PT have any chronic conditions? (i.e. diabetes, asthma, etc.) ---Yes List chronic conditions. ---MS, has had kidney transplant, HTN, high cholesterol Guidelines Guideline Title Affirmed Question Confusion - Delirium [1] Difficult to awaken or acting confused (disoriented, slurred speech) AND [2] present now AND [3] new onset Disp. Time Eilene Ghazi Time) Disposition Final User 06/16/2016 5:39:27 PM Call EMS 911 Now Yes Toribio Harbour, RN, Joelene Millin Referrals GO TO FACILITY UNDECIDED Care Advice Given Per Guideline CALL EMS 911 NOW: Immediate medical attention is needed. You need to hang up and call 911 (or an  ambulance). ([place] Discretion: I'll call you back in a few minutes to be sure you were able to reach them.) CARE ADVICE given per Confusion- Delirium (Adult) guideline.

## 2016-06-17 NOTE — Telephone Encounter (Signed)
Patient returning call to office.  No clinical staff available to speak with her.  Please return call to patient at [phone].

## 2016-06-17 NOTE — Telephone Encounter (Signed)
SW patients daughter, [name] [name] (in [place]), that reports patient has been experiencing hallucinations and dilutional episodes. She states her mother has times of not knowing where she is and extreme emotional swings (crying then laughing). Also reports patient has been up during the night, turning on lights, leaving refrigerator door open and walking outside. Daughter states patient has been taking Clonazepam more frequently than prescribed, but is unaware of exactly how many and how often. Patient is currently staying with family members and they are now managing her medications. Daughter has called Neurologist to update on condition, advised to keep f/u appointment in May. Attempted to call patient, LM to return call. Patient is scheduled with PCP on Wednesday, 06/18/2016.

## 2016-06-18 ENCOUNTER — Encounter (HOSPITAL_COMMUNITY): Payer: Self-pay | Admitting: Internal Medicine

## 2016-06-18 ENCOUNTER — Inpatient Hospital Stay (HOSPITAL_COMMUNITY)
Admission: EM | Admit: 2016-06-18 | Discharge: 2016-06-23 | DRG: 092 | Disposition: A | Discharge status: Home Health | Payer: Medicare Other | Attending: Family Medicine | Admitting: Family Medicine

## 2016-06-18 ENCOUNTER — Inpatient Hospital Stay (HOSPITAL_COMMUNITY): Payer: Medicare Other

## 2016-06-18 ENCOUNTER — Inpatient Hospital Stay (HOSPITAL_COMMUNITY)
Admit: 2016-06-18 | Discharge: 2016-06-18 | Disposition: A | Discharge status: Home / Self Care | Payer: Medicare Other | Attending: Neurology | Admitting: Neurology

## 2016-06-18 ENCOUNTER — Ambulatory Visit: Payer: Medicare Other | Admitting: Family Medicine

## 2016-06-18 DIAGNOSIS — Z9114 Patient's other noncompliance with medication regimen: Secondary | ICD-10-CM | POA: Diagnosis not present

## 2016-06-18 DIAGNOSIS — Z79899 Other long term (current) drug therapy: Secondary | ICD-10-CM | POA: Diagnosis not present

## 2016-06-18 DIAGNOSIS — F418 Other specified anxiety disorders: Secondary | ICD-10-CM | POA: Diagnosis present

## 2016-06-18 DIAGNOSIS — T424X6A Underdosing of benzodiazepines, initial encounter: Secondary | ICD-10-CM | POA: Diagnosis present

## 2016-06-18 DIAGNOSIS — R4182 Altered mental status, unspecified: Secondary | ICD-10-CM

## 2016-06-18 DIAGNOSIS — Z940 Kidney transplant status: Secondary | ICD-10-CM | POA: Diagnosis not present

## 2016-06-18 DIAGNOSIS — Z7952 Long term (current) use of systemic steroids: Secondary | ICD-10-CM

## 2016-06-18 DIAGNOSIS — Z841 Family history of disorders of kidney and ureter: Secondary | ICD-10-CM

## 2016-06-18 DIAGNOSIS — R072 Precordial pain: Secondary | ICD-10-CM

## 2016-06-18 DIAGNOSIS — K219 Gastro-esophageal reflux disease without esophagitis: Secondary | ICD-10-CM | POA: Diagnosis present

## 2016-06-18 DIAGNOSIS — R569 Unspecified convulsions: Secondary | ICD-10-CM | POA: Diagnosis not present

## 2016-06-18 DIAGNOSIS — F13239 Sedative, hypnotic or anxiolytic dependence with withdrawal, unspecified: Secondary | ICD-10-CM | POA: Diagnosis not present

## 2016-06-18 DIAGNOSIS — D631 Anemia in chronic kidney disease: Secondary | ICD-10-CM | POA: Diagnosis present

## 2016-06-18 DIAGNOSIS — Z888 Allergy status to other drugs, medicaments and biological substances status: Secondary | ICD-10-CM

## 2016-06-18 DIAGNOSIS — R834 Abnormal immunological findings in cerebrospinal fluid: Secondary | ICD-10-CM | POA: Diagnosis present

## 2016-06-18 DIAGNOSIS — E876 Hypokalemia: Secondary | ICD-10-CM | POA: Diagnosis present

## 2016-06-18 DIAGNOSIS — Z992 Dependence on renal dialysis: Secondary | ICD-10-CM

## 2016-06-18 DIAGNOSIS — E1122 Type 2 diabetes mellitus with diabetic chronic kidney disease: Secondary | ICD-10-CM | POA: Diagnosis not present

## 2016-06-18 DIAGNOSIS — I10 Essential (primary) hypertension: Secondary | ICD-10-CM | POA: Diagnosis not present

## 2016-06-18 DIAGNOSIS — I120 Hypertensive chronic kidney disease with stage 5 chronic kidney disease or end stage renal disease: Secondary | ICD-10-CM | POA: Diagnosis present

## 2016-06-18 DIAGNOSIS — N184 Chronic kidney disease, stage 4 (severe): Secondary | ICD-10-CM | POA: Diagnosis not present

## 2016-06-18 DIAGNOSIS — I129 Hypertensive chronic kidney disease with stage 1 through stage 4 chronic kidney disease, or unspecified chronic kidney disease: Secondary | ICD-10-CM | POA: Diagnosis present

## 2016-06-18 DIAGNOSIS — G9340 Encephalopathy, unspecified: Secondary | ICD-10-CM | POA: Diagnosis present

## 2016-06-18 DIAGNOSIS — F329 Major depressive disorder, single episode, unspecified: Secondary | ICD-10-CM | POA: Diagnosis present

## 2016-06-18 DIAGNOSIS — G929 Unspecified toxic encephalopathy: Secondary | ICD-10-CM | POA: Diagnosis present

## 2016-06-18 DIAGNOSIS — F419 Anxiety disorder, unspecified: Secondary | ICD-10-CM | POA: Diagnosis not present

## 2016-06-18 DIAGNOSIS — R443 Hallucinations, unspecified: Secondary | ICD-10-CM | POA: Diagnosis not present

## 2016-06-18 DIAGNOSIS — E785 Hyperlipidemia, unspecified: Secondary | ICD-10-CM | POA: Diagnosis present

## 2016-06-18 DIAGNOSIS — F29 Unspecified psychosis not due to a substance or known physiological condition: Secondary | ICD-10-CM | POA: Diagnosis not present

## 2016-06-18 DIAGNOSIS — G92 Toxic encephalopathy: Secondary | ICD-10-CM | POA: Diagnosis not present

## 2016-06-18 DIAGNOSIS — R253 Fasciculation: Secondary | ICD-10-CM | POA: Diagnosis present

## 2016-06-18 DIAGNOSIS — R440 Auditory hallucinations: Secondary | ICD-10-CM | POA: Diagnosis not present

## 2016-06-18 DIAGNOSIS — N186 End stage renal disease: Secondary | ICD-10-CM | POA: Diagnosis present

## 2016-06-18 DIAGNOSIS — F23 Brief psychotic disorder: Secondary | ICD-10-CM | POA: Diagnosis not present

## 2016-06-18 DIAGNOSIS — Z794 Long term (current) use of insulin: Secondary | ICD-10-CM | POA: Diagnosis not present

## 2016-06-18 DIAGNOSIS — E119 Type 2 diabetes mellitus without complications: Secondary | ICD-10-CM

## 2016-06-18 LAB — CREATININE, SERUM
Creatinine, Ser: 2.5 mg/dL — ABNORMAL HIGH (ref 0.44–1.00)
GFR calc Af Amer: 23 mL/min — ABNORMAL LOW (ref >60)
GFR, EST NON AFRICAN AMERICAN: 20 mL/min — AB (ref >60)

## 2016-06-18 LAB — GLUCOSE, CAPILLARY
GLUCOSE-CAPILLARY: 166 mg/dL — AB (ref 65–99)
Glucose-Capillary: 121 mg/dL — ABNORMAL HIGH (ref 65–99)

## 2016-06-18 LAB — URINALYSIS, ROUTINE W REFLEX MICROSCOPIC
BILIRUBIN URINE: NEGATIVE
Glucose, UA: NEGATIVE mg/dL
Hgb urine dipstick: NEGATIVE
KETONES UR: NEGATIVE mg/dL
LEUKOCYTES UA: NEGATIVE
NITRITE: NEGATIVE
Specific Gravity, Urine: 1.01 (ref 1.005–1.030)
pH: 6 (ref 5.0–8.0)

## 2016-06-18 LAB — COMPREHENSIVE METABOLIC PANEL
ALBUMIN: 3.8 g/dL (ref 3.5–5.0)
ALT: 19 U/L (ref 14–54)
ANION GAP: 10 (ref 5–15)
AST: 17 U/L (ref 15–41)
Alkaline Phosphatase: 56 U/L (ref 38–126)
BUN: 28 mg/dL — ABNORMAL HIGH (ref 6–20)
CHLORIDE: 112 mmol/L — AB (ref 101–111)
CO2: 23 mmol/L (ref 22–32)
Calcium: 9.1 mg/dL (ref 8.9–10.3)
Creatinine, Ser: 2.5 mg/dL — ABNORMAL HIGH (ref 0.44–1.00)
GFR calc Af Amer: 23 mL/min — ABNORMAL LOW (ref >60)
GFR calc non Af Amer: 20 mL/min — ABNORMAL LOW (ref >60)
Glucose, Bld: 128 mg/dL — ABNORMAL HIGH (ref 65–99)
POTASSIUM: 3.1 mmol/L — AB (ref 3.5–5.1)
SODIUM: 145 mmol/L (ref 135–145)
Total Bilirubin: 0.6 mg/dL (ref 0.3–1.2)
Total Protein: 7.1 g/dL (ref 6.5–8.1)

## 2016-06-18 LAB — CBC WITH DIFFERENTIAL/PLATELET
BASOS ABS: 0 10*3/uL (ref 0.0–0.1)
Basophils Relative: 0 %
EOS PCT: 1 %
Eosinophils Absolute: 0.1 10*3/uL (ref 0.0–0.7)
HCT: 31.7 % — ABNORMAL LOW (ref 36.0–46.0)
HEMOGLOBIN: 10.3 g/dL — AB (ref 12.0–15.0)
LYMPHS PCT: 22 %
Lymphs Abs: 1.6 10*3/uL (ref 0.7–4.0)
MCH: 27.6 pg (ref 26.0–34.0)
MCHC: 32.5 g/dL (ref 30.0–36.0)
MCV: 85 fL (ref 78.0–100.0)
MONO ABS: 0.6 10*3/uL (ref 0.1–1.0)
MONOS PCT: 9 %
NEUTROS ABS: 4.7 10*3/uL (ref 1.7–7.7)
Neutrophils Relative %: 68 %
PLATELETS: 216 10*3/uL (ref 150–400)
RBC: 3.73 MIL/uL — ABNORMAL LOW (ref 3.87–5.11)
RDW: 14.1 % (ref 11.5–15.5)
WBC: 7 10*3/uL (ref 4.0–10.5)

## 2016-06-18 LAB — CBC
HEMATOCRIT: 31.7 % — AB (ref 36.0–46.0)
HEMOGLOBIN: 10.3 g/dL — AB (ref 12.0–15.0)
MCH: 28.2 pg (ref 26.0–34.0)
MCHC: 32.5 g/dL (ref 30.0–36.0)
MCV: 86.8 fL (ref 78.0–100.0)
Platelets: 225 10*3/uL (ref 150–400)
RBC: 3.65 MIL/uL — AB (ref 3.87–5.11)
RDW: 14.3 % (ref 11.5–15.5)
WBC: 7.3 10*3/uL (ref 4.0–10.5)

## 2016-06-18 LAB — AMMONIA: Ammonia: 17 umol/L (ref 9–35)

## 2016-06-18 LAB — I-STAT CG4 LACTIC ACID, ED: LACTIC ACID, VENOUS: 0.73 mmol/L (ref 0.5–1.9)

## 2016-06-18 LAB — CBG MONITORING, ED: GLUCOSE-CAPILLARY: 125 mg/dL — AB (ref 65–99)

## 2016-06-18 MED ORDER — AMLODIPINE BESYLATE 10 MG PO TABS
10.0000 mg | ORAL_TABLET | Freq: Every day | ORAL | Status: DC
  Administered 2016-06-18 – 2016-06-23 (×6): 10 mg via ORAL
  Filled 2016-06-18 (×6): qty 1

## 2016-06-18 MED ORDER — LOSARTAN POTASSIUM 50 MG PO TABS
100.0000 mg | ORAL_TABLET | Freq: Two times a day (BID) | ORAL | Status: DC
Start: 2016-06-18 — End: 2016-06-22
  Administered 2016-06-18 – 2016-06-22 (×9): 100 mg via ORAL
  Filled 2016-06-18 (×9): qty 2

## 2016-06-18 MED ORDER — CLONAZEPAM 1 MG PO TABS
1.0000 mg | ORAL_TABLET | Freq: Two times a day (BID) | ORAL | Status: DC
  Administered 2016-06-18 (×2): 1 mg via ORAL
  Filled 2016-06-18 (×2): qty 1

## 2016-06-18 MED ORDER — ISOSORBIDE MONONITRATE ER 60 MG PO TB24
60.0000 mg | ORAL_TABLET | Freq: Every day | ORAL | Status: DC
  Administered 2016-06-18 – 2016-06-23 (×6): 60 mg via ORAL
  Filled 2016-06-18 (×6): qty 1

## 2016-06-18 MED ORDER — NITROGLYCERIN 0.4 MG SL SUBL: 0.4000 mg | SUBLINGUAL_TABLET | SUBLINGUAL | Status: DC | PRN

## 2016-06-18 MED ORDER — MAGNESIUM OXIDE 400 (241.3 MG) MG PO TABS
400.0000 mg | ORAL_TABLET | Freq: Every day | ORAL | Status: DC
  Administered 2016-06-18 – 2016-06-22 (×5): 400 mg via ORAL
  Filled 2016-06-18 (×5): qty 1

## 2016-06-18 MED ORDER — FENOFIBRATE 160 MG PO TABS
160.0000 mg | ORAL_TABLET | Freq: Every day | ORAL | Status: DC
  Administered 2016-06-18 – 2016-06-23 (×6): 160 mg via ORAL
  Filled 2016-06-18 (×6): qty 1

## 2016-06-18 MED ORDER — MYCOPHENOLATE MOFETIL 250 MG PO CAPS
750.0000 mg | ORAL_CAPSULE | Freq: Two times a day (BID) | ORAL | Status: DC
  Administered 2016-06-18 – 2016-06-19 (×3): 750 mg via ORAL
  Filled 2016-06-18 (×3): qty 3

## 2016-06-18 MED ORDER — POLYETHYLENE GLYCOL 3350 17 G PO PACK
17.0000 g | PACK | Freq: Every day | ORAL | Status: AC
  Administered 2016-06-18: 17 g via ORAL
  Filled 2016-06-18 (×2): qty 1

## 2016-06-18 MED ORDER — ACETAMINOPHEN 500 MG PO TABS
1000.0000 mg | ORAL_TABLET | Freq: Four times a day (QID) | ORAL | Status: DC | PRN
  Administered 2016-06-18 – 2016-06-22 (×4): 1000 mg via ORAL
  Filled 2016-06-18 (×4): qty 2

## 2016-06-18 MED ORDER — HEPARIN SODIUM (PORCINE) 5000 UNIT/ML IJ SOLN
5000.0000 [IU] | Freq: Three times a day (TID) | INTRAMUSCULAR | Status: DC
  Administered 2016-06-18 – 2016-06-23 (×14): 5000 [IU] via SUBCUTANEOUS
  Filled 2016-06-18 (×15): qty 1

## 2016-06-18 MED ORDER — POTASSIUM CHLORIDE CRYS ER 20 MEQ PO TBCR
20.0000 meq | EXTENDED_RELEASE_TABLET | Freq: Two times a day (BID) | ORAL | Status: DC
  Administered 2016-06-18 – 2016-06-23 (×11): 20 meq via ORAL
  Filled 2016-06-18 (×11): qty 1

## 2016-06-18 MED ORDER — ADULT MULTIVITAMIN W/MINERALS CH
1.0000 | ORAL_TABLET | Freq: Every day | ORAL | Status: DC
  Administered 2016-06-18 – 2016-06-23 (×6): 1 via ORAL
  Filled 2016-06-18 (×11): qty 1

## 2016-06-18 MED ORDER — ONDANSETRON HCL 4 MG PO TABS: 4.0000 mg | ORAL_TABLET | Freq: Four times a day (QID) | ORAL | Status: DC | PRN

## 2016-06-18 MED ORDER — TACROLIMUS 1 MG PO CAPS
5.0000 mg | ORAL_CAPSULE | Freq: Two times a day (BID) | ORAL | Status: DC
  Administered 2016-06-18 – 2016-06-23 (×11): 5 mg via ORAL
  Filled 2016-06-18 (×11): qty 5

## 2016-06-18 MED ORDER — SODIUM CHLORIDE 0.9 % IV SOLN
INTRAVENOUS | Status: DC
  Administered 2016-06-18: 11:00:00 via INTRAVENOUS

## 2016-06-18 MED ORDER — INSULIN ASPART 100 UNIT/ML ~~LOC~~ SOLN
0.0000 [IU] | Freq: Three times a day (TID) | SUBCUTANEOUS | Status: DC
  Administered 2016-06-18: 1 [IU] via SUBCUTANEOUS
  Administered 2016-06-18: 2 [IU] via SUBCUTANEOUS
  Administered 2016-06-19 – 2016-06-20 (×4): 1 [IU] via SUBCUTANEOUS
  Administered 2016-06-20 (×2): 2 [IU] via SUBCUTANEOUS
  Administered 2016-06-21: 1 [IU] via SUBCUTANEOUS

## 2016-06-18 MED ORDER — LORAZEPAM 2 MG/ML IJ SOLN: 1.0000 mg | Freq: Once | INTRAMUSCULAR | Status: DC

## 2016-06-18 MED ORDER — PREDNISONE 5 MG PO TABS
5.0000 mg | ORAL_TABLET | ORAL | Status: DC
  Administered 2016-06-18 – 2016-06-23 (×3): 5 mg via ORAL
  Filled 2016-06-18 (×3): qty 1

## 2016-06-18 MED ORDER — ONDANSETRON HCL 4 MG/2ML IJ SOLN: 4.0000 mg | Freq: Four times a day (QID) | INTRAMUSCULAR | Status: DC | PRN

## 2016-06-18 MED ORDER — CLONAZEPAM 0.5 MG PO TABS
1.0000 mg | ORAL_TABLET | Freq: Once | ORAL | Status: AC
Start: 2016-06-18 — End: 2016-06-18
  Administered 2016-06-18: 1 mg via ORAL
  Filled 2016-06-18: qty 2

## 2016-06-18 NOTE — ED Triage Notes (Signed)
Per EMS pt has been having visual/auditory hallucinations and uncontrollable laughter for the last 2 weeks. Pt has been taking clonazepam for 2 years since her husband passed away. Pt has not had clonazepam since Friday. Pts daughter believes pt is having withdrawals. Pt reports increased anxiety but denies SI/HI.

## 2016-06-18 NOTE — ED Notes (Signed)
Report given to floor RN

## 2016-06-18 NOTE — ED Provider Notes (Signed)
[facility] DEPT Provider Note   CSN: [id number] Arrival date & time: 06/18/16  0501     History   Chief Complaint Chief Complaint  Patient presents with  . Anxiety  . Hallucinations    HPI [name] [name] is a 61 y.o. female who presents the emergency department brought in by her daughter for tremors, hallucinations, and emotional lability. Patient has a past medical history of chronic kidney disease with previous transplant. She is currently on Prograf and prednisone. The patient states that she has "just been feeling dizzy." According to the patient's daughter. She was recently diagnosed with potential mass. She has lesions on her brain via MRI. Her daughter states that she has had episodes of talking to herself, confusion that has been going on for several weeks. Last Wednesday she called her daughter in [place] asking her to "call 911." But was confused and thought she guarded called them herself. Patient's daughter says that she was seen in the ER and told that her tremors and hallucinations were part of her new diagnosis of MS and sent home. She was seen by Dr. Jannifer Franklin and has a confirmatory lumbar puncture that is pending for diagnosis of MS at this time. According to EMR notes by Dr. Jannifer Franklin, her neurologist states that he seek he feels she is having an acute stress reaction due to the new diagnosis of MS. Last Wednesday. Her daughter also spoke with a nurse at one of her doctors office and was told that she should "probably stop giving her the Klonopin" that the patient has been taking for several years. Has not been taking her Klonopin for the past week and her daughter states that her symptoms have worsened. She notes that she has episodes of tremor and fasciculation with associated auditory hallucinations. She states it hurt. Mother seems to be talking to people who were not in the room. She has episodes of clawing at herself or other objects, episodes of hysterical laughter or  hysterical, crying. Her mother also has not been sleeping. She states that she's got about an hour sleep over the past 48 hours.  HPI  Past Medical History:  Diagnosis Date  . Adenomatous colon polyp 04/1998  . Anemia   . Carotid artery disease ([place])    Carotid US 1/18: bilateral ICA 1-39, R thyroid lobe nodule (1.9x2.2x3cm); numerous L thyroid lobe nodules - repeat 1 year  . Chronic kidney disease   . Chronic renal failure    post transplant  . Diabetes mellitus   . Esophagitis    Grade 1 Distal  . GERD (gastroesophageal reflux disease)   . Hemorrhoids   . Hx of cardiovascular stress test    Lexiscan Myoview (06/2013):  No ischemia, EF 66%; normal.  //  Myoview 12/17: EF 62, no ischemia or scar; Normal  . Hx of echocardiogram    a. Echocardiogram (06/2013):  Mod focal basal hypertrophy, EF 60-65%, normal wall motion, Gr 1 DD, mild AI, mildly dilated ascending aorta (41 mm), mild LAE.; b.  Echo 9/16: mod LVH, EF 60-65%, no RWMA, Gr 1 DD, trivial AI, mild dilated ascending aorta, mild LAE  . Hyperkalemia   . Hyperlipidemia   . Hypertension   . Metabolic acidosis   . Pneumonia   . Seizures [facility])     Patient Active Problem List   Diagnosis Date Noted  . Dizziness 04/29/2016  . Carotid artery disease ([place])   . Physical exam 01/28/2016  . Hyperlipidemia 06/28/2015  . Diabetes mellitus type  II, controlled ([place]) 06/28/2015  . Grief 06/28/2015  . Thoracic ascending aortic aneurysm (41 mm on Echo 06/2013) 07/06/2013  . ESRD (end stage renal disease) ([place]) 06/16/2013  . History of renal transplant 06/16/2013  . Chest tightness 07/21/2011  . GERD 08/13/2009  . OTH&UNSPEC NONINFECTIOUS GASTROENTERITIS&COLITIS 08/13/2009  . ABDOMINAL PAIN, LEFT UPPER QUADRANT 05/11/2009  . DIARRHEA-PRESUMED INFECTIOUS 04/25/2009  . Essential hypertension 04/25/2009  . HEMORRHOIDS-INTERNAL 04/25/2009  . RENAL FAILURE-CHRONIC 04/25/2009  . WEIGHT LOSS 04/25/2009  . NAUSEA AND VOMITING 04/25/2009  .  NAUSEA ALONE 04/25/2009  . DIARRHEA 04/25/2009  . PERSONAL HX COLONIC POLYPS 04/25/2009    Past Surgical History:  Procedure Laterality Date  . ABDOMINAL HYSTERECTOMY    . AV FISTULA PLACEMENT  07/04/2005   Cimino AV fistula  . AV FISTULA PLACEMENT  08/27/2005  . AV FISTULA PLACEMENT W/ PTFE  08/27/2005  . CESAREAN SECTION    . DG AV DIALYSIS GRAFT DECLOT OR  07/24/2005   AV Gore-Tex graf  . DG AV DIALYSIS GRAFT DECLOT OR  Thrombosis right forearm, loop arteriovenous   Thrombosis right forearm, loop arteriovenous graft  . KIDNEY TRANSPLANT  2009   Both  . THROMBECTOMY / ARTERIOVENOUS GRAFT REVISION  10/12/2006  . THROMBECTOMY / ARTERIOVENOUS GRAFT REVISION  10/16/2006    OB History    No data available       Home Medications    Prior to Admission medications   Medication Sig Start Date End Date Taking? Authorizing Provider  acetaminophen (TYLENOL) 500 MG tablet Take 1,000 mg by mouth every 6 (six) hours as needed for moderate pain.    Historical Provider, MD  clonazePAM (KLONOPIN) 1 MG tablet Take 1 tablet (1 mg total) by mouth 2 (two) times daily. Patient taking differently: Take 1 mg by mouth 2 (two) times daily as needed for anxiety.  06/06/16   Midge Minium, MD  cyclobenzaprine (FLEXERIL) 10 MG tablet TAKE 1 TABLET (10 MG TOTAL) BY MOUTH 3 (THREE) TIMES DAILY AS NEEDED FOR MUSCLE SPASMS. Patient not taking: Reported on 06/11/2016 05/07/16   Midge Minium, MD  isosorbide mononitrate (IMDUR) 60 MG 24 hr tablet Take 1 tablet (60 mg total) by mouth daily. 03/14/16 03/09/17  Liliane Shi, PA-C  losartan (COZAAR) 100 MG tablet Take 100 mg by mouth 2 (two) times daily.    Historical Provider, MD  Magnesium 250 MG TABS Take 500 mg by mouth daily.    Historical Provider, MD  meclizine (ANTIVERT) 25 MG tablet TAKE 1 TABLET (25 MG TOTAL) BY MOUTH 3 (THREE) TIMES DAILY AS NEEDED FOR DIZZINESS. 04/25/16   Midge Minium, MD  Multiple Vitamin (MULTIVITAMIN) tablet Take 1  tablet by mouth daily.      Historical Provider, MD  mycophenolate (CELLCEPT) 250 MG capsule Take 750 mg by mouth 2 (two) times daily.     Historical Provider, MD  nitroGLYCERIN (NITROSTAT) 0.4 MG SL tablet Place 1 tablet (0.4 mg total) under the tongue every 5 (five) minutes as needed for chest pain. 06/16/13   Liliane Shi, PA-C  potassium chloride SA (K-DUR,KLOR-CON) 20 MEQ tablet Take 1 tablet (20 mEq total) by mouth 2 (two) times daily. Patient not taking: Reported on 06/11/2016 12/24/15   Midge Minium, MD  predniSONE (DELTASONE) 5 MG tablet Take 5 mg by mouth every Monday, Wednesday, and Friday.    Historical Provider, MD  tacrolimus (PROGRAF) 1 MG capsule Take 5 mg by mouth 2 (two) times daily.  Historical Provider, MD    Family History Family History  Problem Relation Age of Onset  . Kidney disease Paternal Aunt   . Heart disease Mother   . Heart disease Father   . Colon cancer Neg Hx     Social History Social History  Substance Use Topics  . Smoking status: Never Smoker  . Smokeless tobacco: Never Used  . Alcohol use No     Allergies   Lisinopril and Daypro [oxaprozin]   Review of Systems Review of Systems Ten systems reviewed and are negative for acute change, except as noted in the HPI.    Physical Exam Updated Vital Signs BP (!) 177/116 (BP Location: Left Arm)   Pulse (!) 101   Temp 98.9 F (37.2 C) (Oral)   Resp 16   SpO2 98%   Physical Exam  Constitutional: She appears well-developed and well-nourished. No distress.  HENT:  Head: Normocephalic and atraumatic.  Eyes: Conjunctivae are normal. No scleral icterus.  Neck: Normal range of motion.  Cardiovascular: Normal rate, regular rhythm and normal heart sounds.  Exam reveals no gallop and no friction rub.   No murmur heard. Pulmonary/Chest: Effort normal and breath sounds normal. No respiratory distress.  Abdominal: Soft. Bowel sounds are normal. She exhibits no distension and no mass. There  is no tenderness. There is no guarding.  Neurological: She is alert.  Tremulous Oriented to person and time. Disoriented to place.  slowed mentation.  Skin: Skin is warm and dry. She is not diaphoretic.  Psychiatric: Her behavior is normal.  Nursing note and vitals reviewed.    ED Treatments / Results  Labs (all labs ordered are listed, but only abnormal results are displayed) Labs Reviewed  CBC WITH DIFFERENTIAL/PLATELET  COMPREHENSIVE METABOLIC PANEL  URINALYSIS, ROUTINE W REFLEX MICROSCOPIC  AMMONIA  CBG MONITORING, ED  I-STAT CG4 LACTIC ACID, ED    EKG  EKG Interpretation None       Radiology No results found.  Procedures Procedures (including critical care time)  Medications Ordered in ED Medications  clonazePAM (KLONOPIN) tablet 1 mg (not administered)     Initial Impression / Assessment and Plan / ED Course  I have reviewed the triage vital signs and the nursing notes.  Pertinent labs & imaging results that were available during my care of the patient were reviewed by me and considered in my medical decision making (see chart for details).  Clinical Course as of Jun 18 724  Wed Jun 18, 2016  0609 Patient with confusion, hallucinations, tremors and fasiculations. She is somewhat confused. DDX includes steroid inuced psychosis, encephalopathy. Awaiting labs. Will need neuro/ psych consult.  [AH]  [phone] Creatinine and kidney fx is at baseline Creatinine: (!) 2.50 [AH]    Clinical Course User Index [AH] Margarita Mail, PA-C    patient labs reviewed and stable. I have discussed the case with Dr. Billy Fischer who will assume care. Final Clinical Impressions(s) / ED Diagnoses   Final diagnoses:  None    New Prescriptions New Prescriptions   No medications on file     Margarita Mail, PA-C 06/18/16 [place], MD 06/19/16 [phone]

## 2016-06-18 NOTE — ED Notes (Signed)
Bed: WA09 Expected date:  Expected time:  Means of arrival:  Comments: 
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
## 2016-06-18 NOTE — Care Management Note (Signed)
Case Management Note  Patient Details  Name: MISHAAL LANSDALE MRN: [id number] Date of Birth: Oct 20, 1955  Subjective/Objective:      Hallucinations-stopped taking her medications              Action/Plan: Date:  June 18, 2016 Chart reviewed for concurrent status and case management needs. Will continue to follow patient progress. Discharge Planning: following for needs Expected discharge date: [id number] Velva Harman, BSN, [place], [place] Expected Discharge Date:   (unknown)               Expected Discharge Plan:  Home/Self Care  In-House Referral:     Discharge planning Services     Post Acute Care Choice:    Choice offered to:     DME Arranged:    DME Agency:     HH Arranged:    [place] Agency:     Status of Service:  In process, will continue to follow  If discussed at Long Length of Stay Meetings, dates discussed:    Additional Comments:  Leeroy Cha, RN 06/18/2016, 10:56 AM

## 2016-06-18 NOTE — Procedures (Signed)
ELECTROENCEPHALOGRAM REPORT  Date of Study: 06/18/2016  Patient's Name: [name] [name] MRN: [id number] Date of Birth: 1955/08/07  Referring Provider: Etta Quill, PA-C  Clinical History: This is a 61 year old woman with altered mental status.  Medications: clonazePAM (KLONOPIN) tablet 1 mg  acetaminophen (TYLENOL) tablet 1,000 mg  amLODipine (NORVASC) tablet 10 mg  fenofibrate tablet 160 mg  heparin injection 5,000 Units  insulin aspart (novoLOG) injection 0-9 Units  isosorbide mononitrate (IMDUR) 24 hr tablet 60 mg  losartan (COZAAR) tablet 100 mg  magnesium oxide (MAG-OX) tablet 400 mg  multivitamin with minerals tablet 1 tablet  mycophenolate (CELLCEPT) capsule 750 mg  nitroGLYCERIN (NITROSTAT) SL tablet 0.4 mg  predniSONE (DELTASONE) tablet 5 mg  tacrolimus (PROGRAF) capsule 5 mg   Technical Summary: A multichannel digital EEG recording measured by the international 10-20 system with electrodes applied with paste and impedances below 5000 ohms performed in our laboratory with EKG monitoring in an awake and drowsy patient.  Hyperventilation and photic stimulation were not performed.  The digital EEG was referentially recorded, reformatted, and digitally filtered in a variety of bipolar and referential montages for optimal display.    Description: The patient is awake and drowsy during the recording.  During maximal wakefulness, there is a symmetric, medium voltage 9 Hz posterior dominant rhythm that attenuates with eye opening.  The record is symmetric.  There is an excess amount of diffuse low voltage beta activity seen throughout the recording. During drowsiness, there is an increase in theta slowing of the background. Deeper stages of sleep were not seen.  Hyperventilation and photic stimulation were not performed.  There were no epileptiform discharges or electrographic seizures seen.    EKG lead was unremarkable.  Impression: This awake and drowsy EEG is normal except  for excess amount of diffuse low voltage beta activity.  Clinical Correlation: Diffuse low voltage beta activity is commonly seen with sedating medications such as benzodiazepines.  In the absence of sedating medications, anxiety and hyperthyroidism may produce generalized beta activity.  The absence of epileptiform discharges does not exclude a clinical diagnosis of epilepsy.  Clinical correlation is advised.   Ellouise Newer, M.D.

## 2016-06-18 NOTE — ED Notes (Signed)
Bed: WA07 Expected date:  Expected time:  Means of arrival:  Comments: 61 yr old anxiety/hallucinations

## 2016-06-18 NOTE — Progress Notes (Signed)
Offsite EEG completed at [facility], results pending. 
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
## 2016-06-19 DIAGNOSIS — F419 Anxiety disorder, unspecified: Secondary | ICD-10-CM

## 2016-06-19 DIAGNOSIS — F13239 Sedative, hypnotic or anxiolytic dependence with withdrawal, unspecified: Secondary | ICD-10-CM

## 2016-06-19 DIAGNOSIS — Z794 Long term (current) use of insulin: Secondary | ICD-10-CM

## 2016-06-19 DIAGNOSIS — Z79899 Other long term (current) drug therapy: Secondary | ICD-10-CM

## 2016-06-19 LAB — BASIC METABOLIC PANEL
Anion gap: 9 (ref 5–15)
BUN: 29 mg/dL — ABNORMAL HIGH (ref 6–20)
CALCIUM: 8.8 mg/dL — AB (ref 8.9–10.3)
CO2: 22 mmol/L (ref 22–32)
CREATININE: 2.27 mg/dL — AB (ref 0.44–1.00)
Chloride: 115 mmol/L — ABNORMAL HIGH (ref 101–111)
GFR, EST AFRICAN AMERICAN: 26 mL/min — AB (ref >60)
GFR, EST NON AFRICAN AMERICAN: 23 mL/min — AB (ref >60)
Glucose, Bld: 122 mg/dL — ABNORMAL HIGH (ref 65–99)
Potassium: 3.4 mmol/L — ABNORMAL LOW (ref 3.5–5.1)
Sodium: 146 mmol/L — ABNORMAL HIGH (ref 135–145)

## 2016-06-19 LAB — GLUCOSE, CAPILLARY
GLUCOSE-CAPILLARY: 121 mg/dL — AB (ref 65–99)
GLUCOSE-CAPILLARY: 125 mg/dL — AB (ref 65–99)
Glucose-Capillary: 134 mg/dL — ABNORMAL HIGH (ref 65–99)
Glucose-Capillary: 121 mg/dL — ABNORMAL HIGH (ref 65–99)

## 2016-06-19 LAB — MYCOPHENOLIC ACID (CELLCEPT)
MPA Glucuronide: 97 ug/mL (ref 15–125)
MPA: 7 ug/mL — ABNORMAL HIGH (ref 1.0–3.5)

## 2016-06-19 LAB — HEMOGLOBIN A1C
HEMOGLOBIN A1C: 6.2 % — AB (ref 4.8–5.6)
MEAN PLASMA GLUCOSE: 131 mg/dL

## 2016-06-19 LAB — TACROLIMUS LEVEL: Tacrolimus (FK506) - LabCorp: 1 ng/mL — ABNORMAL LOW (ref 2.0–20.0)

## 2016-06-19 MED ORDER — CLONAZEPAM 1 MG PO TABS: 2.0000 mg | ORAL_TABLET | Freq: Two times a day (BID) | ORAL | Status: DC

## 2016-06-19 MED ORDER — LORAZEPAM 1 MG PO TABS
1.0000 mg | ORAL_TABLET | ORAL | Status: AC
  Administered 2016-06-19: 1 mg via ORAL
  Filled 2016-06-19: qty 1

## 2016-06-19 MED ORDER — CLONAZEPAM 1 MG PO TABS
1.0000 mg | ORAL_TABLET | Freq: Two times a day (BID) | ORAL | Status: DC
  Administered 2016-06-19 – 2016-06-23 (×8): 1 mg via ORAL
  Filled 2016-06-19 (×8): qty 1

## 2016-06-19 MED ORDER — ESCITALOPRAM OXALATE 10 MG PO TABS
10.0000 mg | ORAL_TABLET | Freq: Every day | ORAL | Status: DC
  Administered 2016-06-19 – 2016-06-23 (×5): 10 mg via ORAL
  Filled 2016-06-19 (×5): qty 1

## 2016-06-19 MED ORDER — CHLORDIAZEPOXIDE HCL 5 MG PO CAPS
10.0000 mg | ORAL_CAPSULE | Freq: Three times a day (TID) | ORAL | Status: DC
  Administered 2016-06-19 – 2016-06-23 (×11): 10 mg via ORAL
  Filled 2016-06-19 (×11): qty 2

## 2016-06-19 NOTE — Progress Notes (Signed)
Subjective: Still has high anxiety and very worried about her bills, thinking about her husbands death, and other home issues.   Exam: Vitals:   06/18/16 2056 06/19/16 0517  BP: 136/90 (!) 155/92  Pulse: (!) 101 82  Resp: 20 18  Temp: 100.1 F (37.8 C) 98.4 F (36.9 C)    HEENT-  Normocephalic, no lesions, without obvious abnormality.  Normal external eye and conjunctiva.  Normal TM's bilaterally.  Normal auditory canals and external ears. Normal external nose, mucus membranes and septum.  Normal pharynx.    Neuro:  CN: Pupils are equal and round. They are symmetrically reactive from 3-->2 mm. EOMI without nystagmus. Facial sensation is intact to light touch. Face is symmetric at rest with normal strength and mobility. Hearing is intact to conversational voice. Palate elevates symmetrically and uvula is midline. Voice is normal in tone, pitch and quality. Bilateral SCM and trapezii are 5/5. Tongue is midline with normal bulk and mobility.  Motor: Normal bulk, tone, and strength. 5/5 throughout. No drift.  Sensation: Intact to light touch.  DTRs: 2+, symmetric  Toes downgoing bilaterally. No pathologic reflexes.  Coordination: Finger-to-nose      Pertinent Labs/Diagnostics: EEG shows no epileptiform activity.  MRI shows no acute changes.    Etta Quill PA-C [facility] [phone]  Impression: No further hallucinations but remains very anxious.  At this time will add 1 mg Ativan once PO and continue with clonazepam.   However, for today will just watch to see  If nay improvement with Benzodiazepines. As stated in previous note, Another possibility would be that this represents some other type of CNS inflammatory disorder, though I feel this is less likely. IF no better tomorrow may consider high dose steroids.       06/19/2016, 8:49 AM
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
## 2016-06-20 LAB — GLUCOSE, CAPILLARY
Glucose-Capillary: 133 mg/dL — ABNORMAL HIGH (ref 65–99)
Glucose-Capillary: 155 mg/dL — ABNORMAL HIGH (ref 65–99)
Glucose-Capillary: 152 mg/dL — ABNORMAL HIGH (ref 65–99)
Glucose-Capillary: 141 mg/dL — ABNORMAL HIGH (ref 65–99)

## 2016-06-20 LAB — BASIC METABOLIC PANEL
ANION GAP: 7 (ref 5–15)
BUN: 24 mg/dL — ABNORMAL HIGH (ref 6–20)
CALCIUM: 9 mg/dL (ref 8.9–10.3)
CO2: 20 mmol/L — AB (ref 22–32)
Chloride: 117 mmol/L — ABNORMAL HIGH (ref 101–111)
Creatinine, Ser: 2.09 mg/dL — ABNORMAL HIGH (ref 0.44–1.00)
GFR, EST AFRICAN AMERICAN: 29 mL/min — AB (ref >60)
GFR, EST NON AFRICAN AMERICAN: 25 mL/min — AB (ref >60)
Glucose, Bld: 127 mg/dL — ABNORMAL HIGH (ref 65–99)
POTASSIUM: 3.6 mmol/L (ref 3.5–5.1)
Sodium: 144 mmol/L (ref 135–145)

## 2016-06-20 MED ORDER — HALOPERIDOL LACTATE 5 MG/ML IJ SOLN
5.0000 mg | Freq: Four times a day (QID) | INTRAMUSCULAR | Status: DC | PRN
  Administered 2016-06-20: 5 mg via INTRAMUSCULAR

## 2016-06-20 MED ORDER — HALOPERIDOL LACTATE 5 MG/ML IJ SOLN
INTRAMUSCULAR | Status: AC
  Filled 2016-06-20: qty 1

## 2016-06-20 MED ORDER — HALOPERIDOL LACTATE 5 MG/ML IJ SOLN
2.0000 mg | Freq: Once | INTRAMUSCULAR | Status: AC
  Administered 2016-06-20: 2 mg via INTRAVENOUS
  Filled 2016-06-20: qty 1

## 2016-06-20 NOTE — Progress Notes (Signed)
Increased confusion,paranoia,lability(screaming/crying) at 1900 with no apparent stressors.. Patient stabilized after standing klonopin/librium given, with family support. As shift progressed,  Patient was Unable to fall asleep,with increased confusion/lability.  Daughter at bedside throughout night and was concerned about erratic behavior.  On call notified at 0300. Haldol 2mg  iv adm.  Immediate somulence noted till present.

## 2016-06-20 NOTE — Progress Notes (Addendum)
Patient Demographics:    [name] [name], is a 61 y.o. female, DOB - May 15, 1955, ZSW:[id number]  Admit date - 06/18/2016   Admitting Physician Charlynne Cousins, MD  Outpatient Primary MD for the patient is Annye Asa, MD  LOS - 2   Chief Complaint  Patient presents with  . Anxiety  . Hallucinations        Subjective:    [name] [name] today has no fevers, no emesis,  No chest pain,  Episodes of confusion, psychosis and agitation persists, patient's daughter at bedside, patient requiring when necessary haloperidol for sedation   Assessment  & Plan :    Principal Problem:   Toxic encephalopathy Active Problems:   Essential hypertension   ESRD (end stage renal disease) ([place])   History of renal transplant   Diabetes mellitus type II, controlled ([place])   Acute encephalopathy   Fasciculation   1)Toxic Acute encephalopathy/facial fasciculation:- - Psychosis persist, with possible MS vs other demyelinating disorder Vs autoimmune process (graft-versus-host), MRI brain findings noted, unable to do MRI with contrast due to renal function, discussed with neurologist Dr. Leonel Ramsay, patient may need high-dose steroids if no improvement with benzos. Risk of steroid-induced psychosis is a real possibility, however at this time patient's symptoms are not improving despite restarting her benzos.  On further questioning it also appears the patient's psychosis started even while she was still on benzos,  EEG result and MRI  reviewed. CSF study with Oligoclonal bands.   2)Prior Renal Transplant/CKD- previously on CellCept and Prograf, patient is immunocompromised, MRI brain suggest possible host versus graft type reaction  - CellCept level elevated- will d/c Cellcept, continue tacrolimus and prednisone, patient may need much higher doses of prednisone as discussed in #1, creatinine down to 2,   3)HTN-  Stable, continue amlodipine 10 mg daily, Imdur 60 mg daily and acetone 100 mg daily  4)Diabetes mellitus type II, controlled-  Last  A1c 6.2, Use Novolog/Humalog Sliding scale insulin with Accu-Cheks/Fingersticks as ordered  5)Social-plan of care discussed with patient's daughter at bedside     DVT prophylaxis: heparin Code Status: Full code  Family Communication: daughter  Disposition Plan: to be determined- follow mental status, psych eval Consultants:   Psych  neuro Procedures:   EEG    Lab Results  Component Value Date   PLT 225 06/18/2016    Inpatient Medications  Scheduled Meds: . amLODipine  10 mg Oral Daily  . chlordiazePOXIDE  10 mg Oral TID  . clonazePAM  1 mg Oral BID  . escitalopram  10 mg Oral Daily  . fenofibrate  160 mg Oral Daily  . haloperidol lactate      . heparin  5,000 Units Subcutaneous Q8H  . insulin aspart  0-9 Units Subcutaneous TID WC  . isosorbide mononitrate  60 mg Oral Daily  . losartan  100 mg Oral BID  . magnesium oxide  400 mg Oral Daily  . multivitamin with minerals  1 tablet Oral Daily  . polyethylene glycol  17 g Oral Daily  . potassium chloride SA  20 mEq Oral BID  . predniSONE  5 mg Oral Q M,W,F  . tacrolimus  5 mg Oral BID   Continuous Infusions: PRN Meds:.acetaminophen, haloperidol lactate, nitroGLYCERIN, ondansetron **OR** ondansetron (  ZOFRAN) IV    Anti-infectives    None        Objective:   Vitals:   06/19/16 1331 06/20/16 0131 06/20/16 0658 06/20/16 1416  BP: (!) 163/98 (!) 168/103 (!) 174/103 135/87  Pulse: 100  94 89  Resp: 20  19 18   Temp: 98.7 F (37.1 C)  99.6 F (37.6 C) 99 F (37.2 C)  TempSrc: Oral  Oral Oral  SpO2: 100%  92% 100%    Wt Readings from Last 3 Encounters:  05/19/16 71.4 kg (157 lb 6 oz)  04/29/16 72.6 kg (160 lb)  04/18/16 74.8 kg (165 lb)     Intake/Output Summary (Last 24 hours) at 06/20/16 1441 Last data filed at 06/19/16 1928  Gross per 24 hour  Intake               240 ml  Output                0 ml  Net              240 ml     Physical Exam  Gen:- Awake Alert,  Disoriented and psychotic HEENT:- [place].AT, No sclera icterus Neck-Supple Neck,No JVD,.  Lungs-  CTAB  CV- S1, S2 normal Abd-  +ve B.Sounds, Abd Soft, No tenderness,    Extremity/Skin:- No  edema,    Neuropsych- confusional episodes, hallucinations, out of Touch with reality. Agitation from time to time    Data Review:   Micro Results No results found for this or any previous visit (from the past 240 hour(s)).  Radiology Reports Ct Head Wo Contrast  Result Date: 06/11/2016 CLINICAL DATA:  Blurred vision, dizziness. EXAM: CT HEAD WITHOUT CONTRAST TECHNIQUE: Contiguous axial images were obtained from the base of the skull through the vertex without intravenous contrast. COMPARISON:  CT scan of April 18, 2016. FINDINGS: Brain: Mild chronic ischemic white matter disease is noted. No mass effect or midline shift is noted. Ventricular size is within normal limits. There is no evidence of mass lesion, hemorrhage or acute infarction. Vascular: No hyperdense vessel or unexpected calcification. Skull: Normal. Negative for fracture or focal lesion. Sinuses/Orbits: No acute finding. Other: None. IMPRESSION: Mild chronic ischemic white matter disease. No acute intracranial abnormality seen. Electronically Signed   By: Marijo Conception, M.D.   On: 06/11/2016 14:33   Mr Brain Wo Contrast  Result Date: 06/18/2016 CLINICAL DATA:  Worsening mental status and hallucinations. Seizures. Suspected multiple sclerosis. History of hypertension, hyperlipidemia, diabetes, end-stage renal disease on dialysis, status post transplant. EXAM: MRI HEAD WITHOUT CONTRAST TECHNIQUE: Multiplanar, multiecho pulse sequences of the brain and surrounding structures were obtained without intravenous contrast. COMPARISON:  MRI of the brain June 11, 2016 FINDINGS: BRAIN: Subcentimeter T2 hyperintensities LEFT cerebellum, patchy to  confluent supratentorial and faint pontine white matter FLAIR T2 hyperintensities, which do not radiate from the periventricular margin and do not demonstrate T1 black holes of demyelination. No susceptibility artifact to suggest hemorrhage. No parenchymal brain volume loss for age. No midline shift, mass effect or masses. No abnormal extra-axial fluid collections. Hippocampi demonstrate symmetric normal size, morphology and signal. SKULL AND UPPER CERVICAL SPINE: No abnormal sellar expansion. No suspicious calvarial bone marrow signal. Craniocervical junction maintained. SINUSES/ORBITS: The mastoid air-cells and included paranasal sinuses are well-aerated. The included ocular globes and orbital contents are non-suspicious. OTHER: None. IMPRESSION: Unchanged white matter disease, atypical for classic demyelination. Findings can be secondary to immunosuppressive therapy, vasculitis or graft-versus-host disease. Electronically Signed  By: Elon Alas M.D.   On: 06/18/2016 21:37   Mr Brain Wo Contrast  Result Date: 06/11/2016 CLINICAL DATA:  Multiple sclerosis. Dizzy. Memory problem. Episode of abnormal speech. EXAM: MRI HEAD WITHOUT CONTRAST TECHNIQUE: Multiplanar, multiecho pulse sequences of the brain and surrounding structures were obtained without intravenous contrast. COMPARISON:  MRI of the brain 05/09/2016. FINDINGS: Brain: Extensive periventricular and subcortical T2 changes are similar the prior study. There is diffuse involvement of the colossal septal margin, compatible with the given diagnosis of multiple sclerosis. There is no restricted diffusion or to suggest acute demyelination. The brainstem and cerebellum are normal. The internal auditory canals are within normal limits bilaterally. Vascular: Flow is present in the major intracranial arteries. Skull and upper cervical spine: The skullbase is within normal limits. Craniocervical junction is normal. Midline sagittal structures are otherwise  unremarkable. Sinuses/Orbits: The paranasal sinuses and mastoid air cells are clear. The globes and orbits are within normal limits. IMPRESSION: 1. Stable diffuse periventricular and subcortical white matter disease bilaterally in a pattern typical of multiple sclerosis. Finding is, however, nonspecific. 2. No acute intracranial abnormality or significant interval change. Electronically Signed   By: San Morelle M.D.   On: 06/11/2016 18:31   Dg Fluoro Guided Loc Of Needle/cath Tip For Spinal Inject Lt  Result Date: 06/02/2016 CLINICAL DATA:  Dizziness.  Abnormal brain MRI. EXAM: DIAGNOSTIC LUMBAR PUNCTURE UNDER FLUOROSCOPIC GUIDANCE FLUOROSCOPY TIME:  Fluoroscopy Time:  3 seconds Radiation Exposure Index (if provided by the fluoroscopic device): 11.31 microGray*m^2 Number of Acquired Spot Images: 0 PROCEDURE: Informed consent was obtained from the patient prior to the procedure, including potential complications of headache, allergy, and pain. With the patient prone, the lower back was prepped with Betadine. 1% Lidocaine was used for local anesthesia. Lumbar puncture was performed at the L3-4 level using a 3.5 inch 20 gauge needle via a left paramedian approach with return of clear CSF with an opening pressure of 23 cm water (measured in the left lateral decubitus position). 8.5 ml of CSF were obtained for laboratory studies. The patient tolerated the procedure well and there were no apparent complications. IMPRESSION: Successful fluoroscopic guided lumbar puncture. Electronically Signed   By: Logan Bores M.D.   On: 06/02/2016 09:06     CBC  Recent Labs Lab 06/18/16 0549 06/18/16 0942  WBC 7.0 7.3  HGB 10.3* 10.3*  HCT 31.7* 31.7*  PLT 216 225  MCV 85.0 86.8  MCH 27.6 28.2  MCHC 32.5 32.5  RDW 14.1 14.3  LYMPHSABS 1.6  --   MONOABS 0.6  --   EOSABS 0.1  --   BASOSABS 0.0  --     Chemistries   Recent Labs Lab 06/18/16 0549 06/18/16 0942 06/19/16 0531 06/20/16 0948  NA 145   --  146* 144  K 3.1*  --  3.4* 3.6  CL 112*  --  115* 117*  CO2 23  --  22 20*  GLUCOSE 128*  --  122* 127*  BUN 28*  --  29* 24*  CREATININE 2.50* 2.50* 2.27* 2.09*  CALCIUM 9.1  --  8.8* 9.0  AST 17  --   --   --   ALT 19  --   --   --   ALKPHOS 56  --   --   --   BILITOT 0.6  --   --   --    ------------------------------------------------------------------------------------------------------------------ No results for input(s): CHOL, HDL, LDLCALC, TRIG, CHOLHDL, LDLDIRECT in the last 72 hours.  Lab Results  Component Value Date   HGBA1C 6.2 (H) 06/18/2016   ------------------------------------------------------------------------------------------------------------------ No results for input(s): TSH, T4TOTAL, T3FREE, THYROIDAB in the last 72 hours.  Invalid input(s): FREET3 ------------------------------------------------------------------------------------------------------------------ No results for input(s): VITAMINB12, FOLATE, FERRITIN, TIBC, IRON, RETICCTPCT in the last 72 hours.  Coagulation profile No results for input(s): INR, PROTIME in the last 168 hours.  No results for input(s): DDIMER in the last 72 hours.  Cardiac Enzymes No results for input(s): CKMB, TROPONINI, MYOGLOBIN in the last 168 hours.  Invalid input(s): CK ------------------------------------------------------------------------------------------------------------------ No results found for: BNP   Matthe Sloane M.D on 06/20/2016 at 2:41 PM  Between 7am to 7pm - Pager - [phone]  After 7pm go to [email or web address] - password [email or web address]  [facility] -  Office  [phone]  Dragon dictation system was used to create this note, attempts have been made to correct errors, however presence of uncorrected errors is not a reflection quality of care provided

## 2016-06-20 NOTE — Progress Notes (Signed)
Subjective: Patient did not sleep well last night. She has been very on edge per daughter. Believes she is at home. Continues to hear and talk to people who are not in room. Feels no different from yesterday.   Exam: Vitals:   06/20/16 0131 06/20/16 0658  BP: (!) 168/103 (!) 174/103  Pulse:  94  Resp:  19  Temp:  99.6 F (37.6 C)    HEENT-  Normocephalic, no lesions, without obvious abnormality.  Normal external eye and conjunctiva.  Normal TM's bilaterally.  Normal auditory canals and external ears. Normal external nose, mucus membranes and septum.  Normal pharynx.    Neuro:  CN: Pupils are equal and round. They are symmetrically reactive from 3-->2 mm. EOMI without nystagmus. Facial sensation is intact to light touch. Face is symmetric at rest with normal strength and mobility. Hearing is intact to conversational voice. Palate elevates symmetrically and uvula is midline. Voice is normal in tone, pitch and quality. Bilateral SCM and trapezii are 5/5. Tongue is midline with normal bulk and mobility.  Motor: Normal bulk, tone, and strength. 5/5 throughout. No drift.  Sensation: Intact to light touch.  DTRs: 2+, symmetric  Toes downgoing bilaterally. No pathologic reflexes.  Coordination: Finger-to-nose and heel-to-shin are without dysmetria     Pertinent Labs/Diagnostics: EEG shows no epileptiform activity.  MRI shows no acute changes.   Etta Quill PA-C [facility] [phone]  Impression: Continues to remain anxious and on edge. continues to have hallucinations and now is confused at where she is having difficulty with sleep.    I continue to debate if this neuropsychiatric progression could be due to an inflammatory brain disease given that she does have an abnormal MRI as well as oligoclonal bands. Treatment is case, would be steroids but if not due to this then steroids could certainly make things worse.  I have discussed this with the patient's daughter, and we  will give it 1 more day but if not seeing improvement by tomorrow, I would consider doing a pulsed dose of steroids.  Recommendations: 1) we'll reassess tomorrow   Roland Rack, MD [facility] [phone]  If 7pm- 7am, please page neurology on call as listed in [place].  06/20/2016, 10:56 AM

## 2016-06-21 DIAGNOSIS — G92 Toxic encephalopathy: Principal | ICD-10-CM

## 2016-06-21 LAB — COMPREHENSIVE METABOLIC PANEL
ALT: 18 U/L (ref 14–54)
ALT: 19 U/L (ref 14–54)
AST: 17 U/L (ref 15–41)
AST: 19 U/L (ref 15–41)
Albumin: 3.5 g/dL (ref 3.5–5.0)
Albumin: 3.3 g/dL — ABNORMAL LOW (ref 3.5–5.0)
Alkaline Phosphatase: 51 U/L (ref 38–126)
Alkaline Phosphatase: 46 U/L (ref 38–126)
Anion gap: 5 (ref 5–15)
Anion gap: 5 (ref 5–15)
BILIRUBIN TOTAL: 0.5 mg/dL (ref 0.3–1.2)
BILIRUBIN TOTAL: 0.5 mg/dL (ref 0.3–1.2)
BUN: 24 mg/dL — ABNORMAL HIGH (ref 6–20)
BUN: 25 mg/dL — AB (ref 6–20)
CALCIUM: 9.3 mg/dL (ref 8.9–10.3)
CALCIUM: 8.8 mg/dL — AB (ref 8.9–10.3)
CHLORIDE: 113 mmol/L — AB (ref 101–111)
CO2: 23 mmol/L (ref 22–32)
CO2: 21 mmol/L — ABNORMAL LOW (ref 22–32)
CREATININE: 2.17 mg/dL — AB (ref 0.44–1.00)
Chloride: 114 mmol/L — ABNORMAL HIGH (ref 101–111)
Creatinine, Ser: 2.08 mg/dL — ABNORMAL HIGH (ref 0.44–1.00)
GFR calc Af Amer: 29 mL/min — ABNORMAL LOW (ref >60)
GFR, EST AFRICAN AMERICAN: 28 mL/min — AB (ref >60)
GFR, EST NON AFRICAN AMERICAN: 24 mL/min — AB (ref >60)
GFR, EST NON AFRICAN AMERICAN: 25 mL/min — AB (ref >60)
GLUCOSE: 109 mg/dL — AB (ref 65–99)
Glucose, Bld: 174 mg/dL — ABNORMAL HIGH (ref 65–99)
POTASSIUM: 4.2 mmol/L (ref 3.5–5.1)
Potassium: 4.1 mmol/L (ref 3.5–5.1)
Sodium: 141 mmol/L (ref 135–145)
Sodium: 140 mmol/L (ref 135–145)
TOTAL PROTEIN: 6.5 g/dL (ref 6.5–8.1)
TOTAL PROTEIN: 6.2 g/dL — AB (ref 6.5–8.1)

## 2016-06-21 LAB — GLUCOSE, CAPILLARY
GLUCOSE-CAPILLARY: 117 mg/dL — AB (ref 65–99)
GLUCOSE-CAPILLARY: 123 mg/dL — AB (ref 65–99)
GLUCOSE-CAPILLARY: 116 mg/dL — AB (ref 65–99)
Glucose-Capillary: 118 mg/dL — ABNORMAL HIGH (ref 65–99)

## 2016-06-21 NOTE — Progress Notes (Signed)
Patient Demographics:    [name] [name], is a 61 y.o. female, DOB - 1955/08/01, PIR:[id number]  Admit date - 06/18/2016   Admitting Physician Charlynne Cousins, MD  Outpatient Primary MD for the patient is Annye Asa, MD  LOS - 3   Chief Complaint  Patient presents with  . Anxiety  . Hallucinations        Subjective:    [name] today has no fevers, no emesis,  No chest pain,  More coherent, calm, daughter at bedside   Assessment  & Plan :    Principal Problem:   Toxic encephalopathy Active Problems:   Essential hypertension   ESRD (end stage renal disease) ([place])   History of renal transplant   Diabetes mellitus type II, controlled ([place])   Acute encephalopathy   Fasciculation  1)Toxic Acute encephalopathy/facial fasciculation:- - Psychosis has improved, Patient is more oriented, more cooperative, less agitated, MRI findings are low with oligoclonal bands on CSF fluid suggest possible MS vs other demyelinating disorder Vs autoimmune process (graft-versus-host), MRI brain findings noted, unable to do MRI with contrast due to renal function, discussed with neurologist Dr. Leonel Ramsay, patient may need high-dose steroids if no improvement with benzos. Risk of steroid-induced psychosis is a real possibility. On further questioning it also appears the patient's psychosis started even while she was still on benzos,  EEG result and MRI reviewed. CSF study with Oligoclonal bands. Hold off on high-dose steroids for now as patient appears to be improving clinically from a neuropsychiatric point   2)Prior Renal Transplant/CKD- previously on CellCept and Prograf, patient is immunocompromised, MRI brain suggest possible host versus graft type reaction - CellCept level elevated- recheck Cellcept levels, continue tacrolimus and low diose prednisone, patient may need much higher doses of  prednisone as discussed in #1, creatinine down to 2,   3)HTN- Stable, continue amlodipine 10 mg daily, Imdur 60 mg daily and acetone 100 mg daily  4)Diabetes mellitus type II, controlled-  Last A1c 6.2, Use Novolog/Humalog Sliding scale insulin with Accu-Cheks/Fingersticks as ordered  5)Social-plan of care discussed with patient's daughter at bedside     DVT prophylaxis:heparin Code Status:Full code  Family Communication:daughter  Disposition Plan:to be determined- follow mental status, psych eval Consultants:  Psych  neuro Procedures:  EEG   Lab Results  Component Value Date   PLT 225 06/18/2016    Inpatient Medications  Scheduled Meds: . amLODipine  10 mg Oral Daily  . chlordiazePOXIDE  10 mg Oral TID  . clonazePAM  1 mg Oral BID  . escitalopram  10 mg Oral Daily  . fenofibrate  160 mg Oral Daily  . heparin  5,000 Units Subcutaneous Q8H  . insulin aspart  0-9 Units Subcutaneous TID WC  . isosorbide mononitrate  60 mg Oral Daily  . losartan  100 mg Oral BID  . magnesium oxide  400 mg Oral Daily  . multivitamin with minerals  1 tablet Oral Daily  . polyethylene glycol  17 g Oral Daily  . potassium chloride SA  20 mEq Oral BID  . predniSONE  5 mg Oral Q M,W,F  . tacrolimus  5 mg Oral BID   Continuous Infusions: PRN Meds:.acetaminophen, haloperidol lactate, nitroGLYCERIN, ondansetron **OR** ondansetron (ZOFRAN) IV    Anti-infectives  None        Objective:   Vitals:   06/20/16 0658 06/20/16 1416 06/20/16 2024 06/21/16 0439  BP: (!) 174/103 135/87 (!) 161/94 (!) 150/98  Pulse: 94 89 89 83  Resp: 19 18 18 18   Temp: 99.6 F (37.6 C) 99 F (37.2 C) 98.8 F (37.1 C) 98.8 F (37.1 C)  TempSrc: Oral Oral Oral Oral  SpO2: 92% 100% 97% 96%    Wt Readings from Last 3 Encounters:  05/19/16 71.4 kg (157 lb 6 oz)  04/29/16 72.6 kg (160 lb)  04/18/16 74.8 kg (165 lb)     Intake/Output Summary (Last 24 hours) at 06/21/16 0803 Last data  filed at 06/20/16 1800  Gross per 24 hour  Intake              702 ml  Output                0 ml  Net              702 ml     Physical Exam  Gen:- Awake Alert,  More cooperative, HEENT:- [place].AT, No sclera icterus Neck-Supple Neck,No JVD,.  Lungs-  CTAB  CV- S1, S2 normal Abd-  +ve B.Sounds, Abd Soft, No tenderness,    Extremity/Skin:- No  edema,    NeuroPsych- has not required when necessary Haldol for several hours now, cooperative and calm    Data Review:   Micro Results No results found for this or any previous visit (from the past 240 hour(s)).  Radiology Reports Ct Head Wo Contrast  Result Date: 06/11/2016 CLINICAL DATA:  Blurred vision, dizziness. EXAM: CT HEAD WITHOUT CONTRAST TECHNIQUE: Contiguous axial images were obtained from the base of the skull through the vertex without intravenous contrast. COMPARISON:  CT scan of April 18, 2016. FINDINGS: Brain: Mild chronic ischemic white matter disease is noted. No mass effect or midline shift is noted. Ventricular size is within normal limits. There is no evidence of mass lesion, hemorrhage or acute infarction. Vascular: No hyperdense vessel or unexpected calcification. Skull: Normal. Negative for fracture or focal lesion. Sinuses/Orbits: No acute finding. Other: None. IMPRESSION: Mild chronic ischemic white matter disease. No acute intracranial abnormality seen. Electronically Signed   By: Marijo Conception, M.D.   On: 06/11/2016 14:33   Mr Brain Wo Contrast  Result Date: 06/18/2016 CLINICAL DATA:  Worsening mental status and hallucinations. Seizures. Suspected multiple sclerosis. History of hypertension, hyperlipidemia, diabetes, end-stage renal disease on dialysis, status post transplant. EXAM: MRI HEAD WITHOUT CONTRAST TECHNIQUE: Multiplanar, multiecho pulse sequences of the brain and surrounding structures were obtained without intravenous contrast. COMPARISON:  MRI of the brain June 11, 2016 FINDINGS: BRAIN: Subcentimeter T2  hyperintensities LEFT cerebellum, patchy to confluent supratentorial and faint pontine white matter FLAIR T2 hyperintensities, which do not radiate from the periventricular margin and do not demonstrate T1 black holes of demyelination. No susceptibility artifact to suggest hemorrhage. No parenchymal brain volume loss for age. No midline shift, mass effect or masses. No abnormal extra-axial fluid collections. Hippocampi demonstrate symmetric normal size, morphology and signal. SKULL AND UPPER CERVICAL SPINE: No abnormal sellar expansion. No suspicious calvarial bone marrow signal. Craniocervical junction maintained. SINUSES/ORBITS: The mastoid air-cells and included paranasal sinuses are well-aerated. The included ocular globes and orbital contents are non-suspicious. OTHER: None. IMPRESSION: Unchanged white matter disease, atypical for classic demyelination. Findings can be secondary to immunosuppressive therapy, vasculitis or graft-versus-host disease. Electronically Signed   By: Thana Farr.D.  On: 06/18/2016 21:37   Mr Brain Wo Contrast  Result Date: 06/11/2016 CLINICAL DATA:  Multiple sclerosis. Dizzy. Memory problem. Episode of abnormal speech. EXAM: MRI HEAD WITHOUT CONTRAST TECHNIQUE: Multiplanar, multiecho pulse sequences of the brain and surrounding structures were obtained without intravenous contrast. COMPARISON:  MRI of the brain 05/09/2016. FINDINGS: Brain: Extensive periventricular and subcortical T2 changes are similar the prior study. There is diffuse involvement of the colossal septal margin, compatible with the given diagnosis of multiple sclerosis. There is no restricted diffusion or to suggest acute demyelination. The brainstem and cerebellum are normal. The internal auditory canals are within normal limits bilaterally. Vascular: Flow is present in the major intracranial arteries. Skull and upper cervical spine: The skullbase is within normal limits. Craniocervical junction is  normal. Midline sagittal structures are otherwise unremarkable. Sinuses/Orbits: The paranasal sinuses and mastoid air cells are clear. The globes and orbits are within normal limits. IMPRESSION: 1. Stable diffuse periventricular and subcortical white matter disease bilaterally in a pattern typical of multiple sclerosis. Finding is, however, nonspecific. 2. No acute intracranial abnormality or significant interval change. Electronically Signed   By: San Morelle M.D.   On: 06/11/2016 18:31   Dg Fluoro Guided Loc Of Needle/cath Tip For Spinal Inject Lt  Result Date: 06/02/2016 CLINICAL DATA:  Dizziness.  Abnormal brain MRI. EXAM: DIAGNOSTIC LUMBAR PUNCTURE UNDER FLUOROSCOPIC GUIDANCE FLUOROSCOPY TIME:  Fluoroscopy Time:  3 seconds Radiation Exposure Index (if provided by the fluoroscopic device): 11.31 microGray*m^2 Number of Acquired Spot Images: 0 PROCEDURE: Informed consent was obtained from the patient prior to the procedure, including potential complications of headache, allergy, and pain. With the patient prone, the lower back was prepped with Betadine. 1% Lidocaine was used for local anesthesia. Lumbar puncture was performed at the L3-4 level using a 3.5 inch 20 gauge needle via a left paramedian approach with return of clear CSF with an opening pressure of 23 cm water (measured in the left lateral decubitus position). 8.5 ml of CSF were obtained for laboratory studies. The patient tolerated the procedure well and there were no apparent complications. IMPRESSION: Successful fluoroscopic guided lumbar puncture. Electronically Signed   By: Logan Bores M.D.   On: 06/02/2016 09:06     CBC  Recent Labs Lab 06/18/16 0549 06/18/16 0942  WBC 7.0 7.3  HGB 10.3* 10.3*  HCT 31.7* 31.7*  PLT 216 225  MCV 85.0 86.8  MCH 27.6 28.2  MCHC 32.5 32.5  RDW 14.1 14.3  LYMPHSABS 1.6  --   MONOABS 0.6  --   EOSABS 0.1  --   BASOSABS 0.0  --     Chemistries   Recent Labs Lab 06/18/16 0549  06/18/16 0942 06/19/16 0531 06/20/16 0948  NA 145  --  146* 144  K 3.1*  --  3.4* 3.6  CL 112*  --  115* 117*  CO2 23  --  22 20*  GLUCOSE 128*  --  122* 127*  BUN 28*  --  29* 24*  CREATININE 2.50* 2.50* 2.27* 2.09*  CALCIUM 9.1  --  8.8* 9.0  AST 17  --   --   --   ALT 19  --   --   --   ALKPHOS 56  --   --   --   BILITOT 0.6  --   --   --    ------------------------------------------------------------------------------------------------------------------ No results for input(s): CHOL, HDL, LDLCALC, TRIG, CHOLHDL, LDLDIRECT in the last 72 hours.  Lab Results  Component Value Date  HGBA1C 6.2 (H) 06/18/2016   ------------------------------------------------------------------------------------------------------------------ No results for input(s): TSH, T4TOTAL, T3FREE, THYROIDAB in the last 72 hours.  Invalid input(s): FREET3 ------------------------------------------------------------------------------------------------------------------ No results for input(s): VITAMINB12, FOLATE, FERRITIN, TIBC, IRON, RETICCTPCT in the last 72 hours.  Coagulation profile No results for input(s): INR, PROTIME in the last 168 hours.  No results for input(s): DDIMER in the last 72 hours.  Cardiac Enzymes No results for input(s): CKMB, TROPONINI, MYOGLOBIN in the last 168 hours.  Invalid input(s): CK ------------------------------------------------------------------------------------------------------------------ No results found for: BNP   Kirt Chew M.D on 06/21/2016 at 8:03 AM  Between 7am to 7pm - Pager - [phone]  After 7pm go to [email or web address] - password [email or web address]  [facility] -  Office  [phone]  Dragon dictation system was used to create this note, attempts have been made to correct errors, however presence of uncorrected errors is not a reflection quality of care provided

## 2016-06-21 NOTE — Progress Notes (Signed)
Subjective: Signicantly improved overnight, no halluciations  Exam: Vitals:   06/20/16 2024 06/21/16 0439  BP: (!) 161/94 (!) 150/98  Pulse: 89 83  Resp: 18 18  Temp: 98.8 F (37.1 C) 98.8 F (37.1 C)   Gen: In bed, NAD Resp: non-labored breathing, no acute distress Abd: soft, nt  Neuro: MS: awake, alert, oriented to month, year, gives place as [place].  KV:QQVZD, VFF Motor: MAEW   Impression: 61 yo F with progressive neuropsychiatric decline in the setting of concern for multiple sclerosis. I do wonder if this is overall a manifestation of anxiety/depression provoked by her concern for this diagnosis.   The possibility that she does have an inflammatory brain disease(as evidenced by MRI and positive oligoclona bands) could be contributory, but at this time, with some improvement, I would favor treating with psychiatric medications rather than steroids given that steroids could exacerbate it if it is psychiatric. If she plateaus or worsens, then further workup and empiric steroids may be indicated.   Recommendations: 1) Continue treatment for anxiety/depression(neuroleptics started yesterday)  2) Will follow.   Roland Rack, MD [facility] [phone]  If 7pm- 7am, please page neurology on call as listed in [place].

## 2016-06-22 LAB — CBC
HCT: 28.9 % — ABNORMAL LOW (ref 36.0–46.0)
HEMOGLOBIN: 9.6 g/dL — AB (ref 12.0–15.0)
MCH: 29.1 pg (ref 26.0–34.0)
MCHC: 33.2 g/dL (ref 30.0–36.0)
MCV: 87.6 fL (ref 78.0–100.0)
PLATELETS: 221 10*3/uL (ref 150–400)
RBC: 3.3 MIL/uL — ABNORMAL LOW (ref 3.87–5.11)
RDW: 14.2 % (ref 11.5–15.5)
WBC: 6.3 10*3/uL (ref 4.0–10.5)

## 2016-06-22 LAB — GLUCOSE, CAPILLARY
GLUCOSE-CAPILLARY: 98 mg/dL (ref 65–99)
Glucose-Capillary: 100 mg/dL — ABNORMAL HIGH (ref 65–99)
Glucose-Capillary: 101 mg/dL — ABNORMAL HIGH (ref 65–99)
Glucose-Capillary: 132 mg/dL — ABNORMAL HIGH (ref 65–99)

## 2016-06-22 MED ORDER — HYDRALAZINE HCL 50 MG PO TABS
50.0000 mg | ORAL_TABLET | Freq: Three times a day (TID) | ORAL | Status: DC
  Administered 2016-06-22 – 2016-06-23 (×2): 50 mg via ORAL
  Filled 2016-06-22 (×2): qty 1

## 2016-06-22 MED ORDER — LOSARTAN POTASSIUM 50 MG PO TABS
100.0000 mg | ORAL_TABLET | Freq: Every day | ORAL | Status: DC
  Administered 2016-06-23: 100 mg via ORAL
  Filled 2016-06-22: qty 2

## 2016-06-22 NOTE — Progress Notes (Signed)
Patient Demographics:    [name] [name], is a 61 y.o. female, DOB - 1956/01/13, XVQ:[id number]  Admit date - 06/18/2016   Admitting Physician Charlynne Cousins, MD  Outpatient Primary MD for the patient is Annye Asa, MD  LOS - 4   Chief Complaint  Patient presents with  . Anxiety  . Hallucinations        Subjective:    [name] [name] today has no fevers, no emesis,  No chest pain,  Daughter at bedside, more coherent   Assessment  & Plan :    Principal Problem:   Toxic encephalopathy Active Problems:   Essential hypertension   ESRD (end stage renal disease) ([place])   History of renal transplant   Diabetes mellitus type II, controlled ([place])   Acute encephalopathy   Fasciculation  1)Toxic Acute Encephalopathy/Facial Fasciculation:- - Psychosis Continues to improve,  Patient is more oriented, more cooperative, less agitated, Pt has oligoclonal bands on CSF fluid and MRI Brain findings suggestive of Possible MS vs other Demyelinating disorder Vs autoimmune process (graft-versus-host),  unable to do MRI with contrast due to renal function, discussed with neurologist Dr. Leonel Ramsay, symptoms are improving on benzos at this time. EEG result and MRI noted. Hold off on high-dose steroids for now as patient appears to be improving clinically from a neuropsychiatric point   2)Prior Renal Transplant/CKD- previously on CellCept and Prograf, patient is immunocompromised, MRI brain suggest possible host versus graft type reaction - CellCept level elevated- recheck Cellcept levels, continue tacrolimus and low diose prednisone, patient may need much higher doses of prednisone as discussed in #1,creatinine down to 2,   3)HTN- Stable, continue amlodipine 10 mg daily, Imdur 60 mg daily and Losartan 100 mg daily, add Hydralazine 50 mg tid  4)Diabetes mellitus type II, controlled- LastA1c 6.2, Use  Novolog/Humalog Sliding scale insulin with Accu-Cheks/Fingersticks as ordered  5)Social-plan of care discussed with patient's daughter at bedside    DVT prophylaxis:heparin Code Status:Full code  Family Communication:daughter  Disposition Plan:to be determined- follow mental status, psych eval Consultants:  Psych  neuro Procedures:  EEG   Lab Results  Component Value Date   PLT 221 06/22/2016    Inpatient Medications  Scheduled Meds: . amLODipine  10 mg Oral Daily  . chlordiazePOXIDE  10 mg Oral TID  . clonazePAM  1 mg Oral BID  . escitalopram  10 mg Oral Daily  . fenofibrate  160 mg Oral Daily  . heparin  5,000 Units Subcutaneous Q8H  . hydrALAZINE  50 mg Oral TID  . insulin aspart  0-9 Units Subcutaneous TID WC  . isosorbide mononitrate  60 mg Oral Daily  . [START ON 06/23/2016] losartan  100 mg Oral Daily  . magnesium oxide  400 mg Oral Daily  . multivitamin with minerals  1 tablet Oral Daily  . potassium chloride SA  20 mEq Oral BID  . predniSONE  5 mg Oral Q M,W,F  . tacrolimus  5 mg Oral BID   Continuous Infusions: PRN Meds:.acetaminophen, haloperidol lactate, nitroGLYCERIN, ondansetron **OR** ondansetron (ZOFRAN) IV    Anti-infectives    None        Objective:   Vitals:   06/21/16 1334 06/21/16 2143 06/22/16 0600 06/22/16 1351  BP: (!) 137/91 (!) 155/100 [name])  166/98 (!) 158/101  Pulse: 82 92 77 89  Resp: 18 18 17  (!) 22  Temp: 99.2 F (37.3 C) 99.8 F (37.7 C) 98 F (36.7 C) 99.6 F (37.6 C)  TempSrc: Oral Oral Oral Oral  SpO2: 98% 98% 97% 99%    Wt Readings from Last 3 Encounters:  05/19/16 71.4 kg (157 lb 6 oz)  04/29/16 72.6 kg (160 lb)  04/18/16 74.8 kg (165 lb)     Intake/Output Summary (Last 24 hours) at 06/22/16 1816 Last data filed at 06/22/16 5397  Gross per 24 hour  Intake              720 ml  Output                0 ml  Net              720 ml     Physical Exam  Gen:- Awake Alert,  In no apparent distress    HEENT:- [place].AT, No sclera icterus Neck-Supple Neck,No JVD,.  Lungs-  CTAB  CV- S1, S2 normal Abd-  +ve B.Sounds, Abd Soft, No tenderness,    Extremity/Skin:- No  edema,    NeuroPsych- ambulating the hallways without new focal deficits, from a cognitive standpoint patient is more coherent, no significant confusion, doesn't appear to be having hallucinations at this time    Data Review:   Micro Results No results found for this or any previous visit (from the past 240 hour(s)).  Radiology Reports Ct Head Wo Contrast  Result Date: 06/11/2016 CLINICAL DATA:  Blurred vision, dizziness. EXAM: CT HEAD WITHOUT CONTRAST TECHNIQUE: Contiguous axial images were obtained from the base of the skull through the vertex without intravenous contrast. COMPARISON:  CT scan of April 18, 2016. FINDINGS: Brain: Mild chronic ischemic white matter disease is noted. No mass effect or midline shift is noted. Ventricular size is within normal limits. There is no evidence of mass lesion, hemorrhage or acute infarction. Vascular: No hyperdense vessel or unexpected calcification. Skull: Normal. Negative for fracture or focal lesion. Sinuses/Orbits: No acute finding. Other: None. IMPRESSION: Mild chronic ischemic white matter disease. No acute intracranial abnormality seen. Electronically Signed   By: Marijo Conception, M.D.   On: 06/11/2016 14:33   Mr Brain Wo Contrast  Result Date: 06/18/2016 CLINICAL DATA:  Worsening mental status and hallucinations. Seizures. Suspected multiple sclerosis. History of hypertension, hyperlipidemia, diabetes, end-stage renal disease on dialysis, status post transplant. EXAM: MRI HEAD WITHOUT CONTRAST TECHNIQUE: Multiplanar, multiecho pulse sequences of the brain and surrounding structures were obtained without intravenous contrast. COMPARISON:  MRI of the brain June 11, 2016 FINDINGS: BRAIN: Subcentimeter T2 hyperintensities LEFT cerebellum, patchy to confluent supratentorial and faint  pontine white matter FLAIR T2 hyperintensities, which do not radiate from the periventricular margin and do not demonstrate T1 black holes of demyelination. No susceptibility artifact to suggest hemorrhage. No parenchymal brain volume loss for age. No midline shift, mass effect or masses. No abnormal extra-axial fluid collections. Hippocampi demonstrate symmetric normal size, morphology and signal. SKULL AND UPPER CERVICAL SPINE: No abnormal sellar expansion. No suspicious calvarial bone marrow signal. Craniocervical junction maintained. SINUSES/ORBITS: The mastoid air-cells and included paranasal sinuses are well-aerated. The included ocular globes and orbital contents are non-suspicious. OTHER: None. IMPRESSION: Unchanged white matter disease, atypical for classic demyelination. Findings can be secondary to immunosuppressive therapy, vasculitis or graft-versus-host disease. Electronically Signed   By: Elon Alas M.D.   On: 06/18/2016 21:37   Mr Brain Wo Contrast  Result Date: 06/11/2016 CLINICAL DATA:  Multiple sclerosis. Dizzy. Memory problem. Episode of abnormal speech. EXAM: MRI HEAD WITHOUT CONTRAST TECHNIQUE: Multiplanar, multiecho pulse sequences of the brain and surrounding structures were obtained without intravenous contrast. COMPARISON:  MRI of the brain 05/09/2016. FINDINGS: Brain: Extensive periventricular and subcortical T2 changes are similar the prior study. There is diffuse involvement of the colossal septal margin, compatible with the given diagnosis of multiple sclerosis. There is no restricted diffusion or to suggest acute demyelination. The brainstem and cerebellum are normal. The internal auditory canals are within normal limits bilaterally. Vascular: Flow is present in the major intracranial arteries. Skull and upper cervical spine: The skullbase is within normal limits. Craniocervical junction is normal. Midline sagittal structures are otherwise unremarkable. Sinuses/Orbits: The  paranasal sinuses and mastoid air cells are clear. The globes and orbits are within normal limits. IMPRESSION: 1. Stable diffuse periventricular and subcortical white matter disease bilaterally in a pattern typical of multiple sclerosis. Finding is, however, nonspecific. 2. No acute intracranial abnormality or significant interval change. Electronically Signed   By: San Morelle M.D.   On: 06/11/2016 18:31   Dg Fluoro Guided Loc Of Needle/cath Tip For Spinal Inject Lt  Result Date: 06/02/2016 CLINICAL DATA:  Dizziness.  Abnormal brain MRI. EXAM: DIAGNOSTIC LUMBAR PUNCTURE UNDER FLUOROSCOPIC GUIDANCE FLUOROSCOPY TIME:  Fluoroscopy Time:  3 seconds Radiation Exposure Index (if provided by the fluoroscopic device): 11.31 microGray*m^2 Number of Acquired Spot Images: 0 PROCEDURE: Informed consent was obtained from the patient prior to the procedure, including potential complications of headache, allergy, and pain. With the patient prone, the lower back was prepped with Betadine. 1% Lidocaine was used for local anesthesia. Lumbar puncture was performed at the L3-4 level using a 3.5 inch 20 gauge needle via a left paramedian approach with return of clear CSF with an opening pressure of 23 cm water (measured in the left lateral decubitus position). 8.5 ml of CSF were obtained for laboratory studies. The patient tolerated the procedure well and there were no apparent complications. IMPRESSION: Successful fluoroscopic guided lumbar puncture. Electronically Signed   By: Logan Bores M.D.   On: 06/02/2016 09:06     CBC  Recent Labs Lab 06/18/16 0549 06/18/16 0942 06/22/16 0552  WBC 7.0 7.3 6.3  HGB 10.3* 10.3* 9.6*  HCT 31.7* 31.7* 28.9*  PLT 216 225 221  MCV 85.0 86.8 87.6  MCH 27.6 28.2 29.1  MCHC 32.5 32.5 33.2  RDW 14.1 14.3 14.2  LYMPHSABS 1.6  --   --   MONOABS 0.6  --   --   EOSABS 0.1  --   --   BASOSABS 0.0  --   --     Chemistries   Recent Labs Lab 06/18/16 0549 06/18/16 0942  06/19/16 0531 06/20/16 0948 06/21/16 0903 06/21/16 1537  NA 145  --  146* 144 141 140  K 3.1*  --  3.4* 3.6 4.1 4.2  CL 112*  --  115* 117* 113* 114*  CO2 23  --  22 20* 23 21*  GLUCOSE 128*  --  122* 127* 174* 109*  BUN 28*  --  29* 24* 24* 25*  CREATININE 2.50* 2.50* 2.27* 2.09* 2.17* 2.08*  CALCIUM 9.1  --  8.8* 9.0 9.3 8.8*  AST 17  --   --   --  17 19  ALT 19  --   --   --  18 19  ALKPHOS 56  --   --   --  51  46  BILITOT 0.6  --   --   --  0.5 0.5   ------------------------------------------------------------------------------------------------------------------ No results for input(s): CHOL, HDL, LDLCALC, TRIG, CHOLHDL, LDLDIRECT in the last 72 hours.  Lab Results  Component Value Date   HGBA1C 6.2 (H) 06/18/2016   ------------------------------------------------------------------------------------------------------------------ No results for input(s): TSH, T4TOTAL, T3FREE, THYROIDAB in the last 72 hours.  Invalid input(s): FREET3 ------------------------------------------------------------------------------------------------------------------ No results for input(s): VITAMINB12, FOLATE, FERRITIN, TIBC, IRON, RETICCTPCT in the last 72 hours.  Coagulation profile No results for input(s): INR, PROTIME in the last 168 hours.  No results for input(s): DDIMER in the last 72 hours.  Cardiac Enzymes No results for input(s): CKMB, TROPONINI, MYOGLOBIN in the last 168 hours.  Invalid input(s): CK ------------------------------------------------------------------------------------------------------------------ No results found for: BNP   Sheray Grist M.D on 06/22/2016 at 6:16 PM  Between 7am to 7pm - Pager - [phone]  After 7pm go to [email or web address] - password [email or web address]  [facility] -  Office  [phone]  Dragon dictation system was used to create this note, attempts have been made to correct errors, however presence of uncorrected errors is not a reflection  quality of care provided

## 2016-06-22 NOTE — Progress Notes (Signed)
Subjective: Contine[place] to be improved.   Exam: Vitals:   06/22/16 0600 06/22/16 1351  BP: (!) 166/98 (!) 158/101  Pulse: 77 89  Resp: 17 (!) 22  Temp: 98 F (36.7 C) 99.6 F (37.6 C)   Gen: In bed, NAD Resp: non-labored breathing, no acute distress Abd: soft, nt  Neuro: MS: awake, alert, oriented to month, year, place. World backwards is D-L-O-R-W. Some difficulty with maht.  OE:HOZYY, VFF Motor: MAEW   Impression: 61 yo F with progressive neuropsychiatric decline in the setting of concern for multiple sclerosis. I do wonder if this is overall a manifestation of anxiety/depression provoked by her concern for this diagnosis.   The possibility that she does have an inflammatory brain disease(as evidenced by MRI and positive oligoclona bands) could be contributory, but at this time, with some improvement, I would favor treating with psychiatric medications rather than steroids given that steroids could exacerbate it if it is psychiatric. If she plateaus or worsens, then further workup and empiric steroids may be indicated.   Recommendations: 1) Continue treatment for anxiety/depression 2) Will send serum autoimmune encephalopathy panel(ENS1) 3) If the patient does plateau or autoimmune panel is positive, would consider high dose steroids.  4) Neurology will sign off at this time, please call with further questions or concerns.   Roland Rack, MD [facility] [phone]  If 7pm- 7am, please page neurology on call as listed in [place].

## 2016-06-23 LAB — GLUCOSE, CAPILLARY
GLUCOSE-CAPILLARY: 103 mg/dL — AB (ref 65–99)
Glucose-Capillary: 98 mg/dL (ref 65–99)
Glucose-Capillary: 128 mg/dL — ABNORMAL HIGH (ref 65–99)

## 2016-06-23 LAB — BASIC METABOLIC PANEL
Anion gap: 6 (ref 5–15)
BUN: 27 mg/dL — ABNORMAL HIGH (ref 6–20)
CALCIUM: 9 mg/dL (ref 8.9–10.3)
CO2: 19 mmol/L — AB (ref 22–32)
Chloride: 111 mmol/L (ref 101–111)
Creatinine, Ser: 2.41 mg/dL — ABNORMAL HIGH (ref 0.44–1.00)
GFR calc non Af Amer: 21 mL/min — ABNORMAL LOW (ref >60)
GFR, EST AFRICAN AMERICAN: 24 mL/min — AB (ref >60)
GLUCOSE: 109 mg/dL — AB (ref 65–99)
Potassium: 4.8 mmol/L (ref 3.5–5.1)
Sodium: 136 mmol/L (ref 135–145)

## 2016-06-23 LAB — MYCOPHENOLIC ACID (CELLCEPT)
MPA GLUCURONIDE: 53 ug/mL (ref 15–125)
MPA: 2.4 ug/mL (ref 1.0–3.5)

## 2016-06-23 MED ORDER — ESCITALOPRAM OXALATE 10 MG PO TABS: 10.0000 mg | ORAL_TABLET | Freq: Every day | ORAL | 3 refills | Status: DC

## 2016-06-23 MED ORDER — HYDRALAZINE HCL 50 MG PO TABS: 50.0000 mg | ORAL_TABLET | Freq: Three times a day (TID) | ORAL | 1 refills | Status: DC

## 2016-06-23 MED ORDER — CHLORDIAZEPOXIDE HCL 5 MG PO CAPS: 5.0000 mg | ORAL_CAPSULE | Freq: Three times a day (TID) | ORAL | 1 refills | Status: DC

## 2016-06-23 MED ORDER — CLONAZEPAM 1 MG PO TABS: 1.0000 mg | ORAL_TABLET | Freq: Two times a day (BID) | ORAL | 1 refills | Status: DC

## 2016-06-23 MED ORDER — SODIUM CHLORIDE 0.9 % IV SOLN: INTRAVENOUS | Status: DC

## 2016-06-23 MED ORDER — FUROSEMIDE 40 MG PO TABS: ORAL_TABLET | ORAL | 1 refills | Status: DC

## 2016-06-23 MED ORDER — LOSARTAN POTASSIUM 100 MG PO TABS: 100.0000 mg | ORAL_TABLET | Freq: Every day | ORAL | 1 refills | Status: DC

## 2016-06-23 MED ORDER — SODIUM CHLORIDE 0.9 % IV SOLN
INTRAVENOUS | Status: AC
  Administered 2016-06-23: 11:00:00 via INTRAVENOUS

## 2016-06-23 MED ORDER — ISOSORBIDE MONONITRATE ER 60 MG PO TB24: 60.0000 mg | ORAL_TABLET | Freq: Every day | ORAL | 1 refills | Status: DC

## 2016-06-23 NOTE — Progress Notes (Addendum)
Date:  June 23, 2016 Chart reviewed for concurrent status and case management needs. Will continue to follow patient progress. Discharge Planning: following for needs/ [facility] for RN and PT notified per adult children request and orders for services./pt is going to the relative-Clarissa Quentin [facility] at [address] Letta Kocher Asp[place] [phone] for contact number. Expected discharge date: [id number] Velva Harman, BSN, [place], [place]
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
## 2016-06-23 NOTE — Discharge Instructions (Signed)
1)Discharge home with home health services 2)Follow up with her nephrologist next week to recheck your creatinine and also to follow your Prograf and CellCept levels 3) follow-up with Dr. Normajean Baxter the psychiatrist 4) follow-up with your neurologist as outpatient

## 2016-06-24 ENCOUNTER — Telehealth: Payer: Self-pay

## 2016-06-24 NOTE — Telephone Encounter (Signed)
LM requesting call back to complete TCM and schedule hospital f/u.  

## 2016-06-25 LAB — MISC LABCORP TEST (SEND OUT)

## 2016-06-25 NOTE — Telephone Encounter (Addendum)
Spoke with patient initially, then with daughter, [name] [name].   Transition Care Management Follow-up Telephone Call   Date discharged? 06/23/2016   How have you been since you were released from the hospital? "jittery and nervous, but better"   Do you understand why you were in the hospital? yes, "withdrawal from Clonazepam"   Do you understand the discharge instructions? Yes   Where were you discharged to? Home. Daughter staying with patient until 06/26/2016, then patient will stay with sister in law.     Items Reviewed:  Medications reviewed: yes  Allergies reviewed: yes  Dietary changes reviewed: no changes.   Referrals reviewed: yes, Neurologist on 07/09/16. Psychiatrist.    Functional Questionnaire:   Activities of Daily Living (ADLs):   She states they are independent in the following: ambulation, bathing and hygiene, feeding, continence, grooming, toileting and dressing States they require assistance with the following: None.   RN to visit home once/week. Physical Therapy at home twice/week.     Any transportation issues/concerns?: no   Any patient concerns? no   Confirmed importance and date/time of follow-up visits scheduled yes  Provider Appointment booked with [name] Aquas, PA-C on Monday, 06/30/16 @ 11:30am.   Confirmed with patient if condition begins to worsen call PCP or go to the ER.  Patient was given the office number and encouraged to call back with question or concerns.  : yes

## 2016-06-28 DIAGNOSIS — Z940 Kidney transplant status: Secondary | ICD-10-CM | POA: Diagnosis not present

## 2016-06-28 DIAGNOSIS — E119 Type 2 diabetes mellitus without complications: Secondary | ICD-10-CM | POA: Diagnosis not present

## 2016-06-28 DIAGNOSIS — E785 Hyperlipidemia, unspecified: Secondary | ICD-10-CM | POA: Diagnosis not present

## 2016-06-28 DIAGNOSIS — D649 Anemia, unspecified: Secondary | ICD-10-CM | POA: Diagnosis not present

## 2016-06-28 DIAGNOSIS — K219 Gastro-esophageal reflux disease without esophagitis: Secondary | ICD-10-CM | POA: Diagnosis not present

## 2016-06-28 DIAGNOSIS — G92 Toxic encephalopathy: Secondary | ICD-10-CM | POA: Diagnosis not present

## 2016-06-28 DIAGNOSIS — I10 Essential (primary) hypertension: Secondary | ICD-10-CM | POA: Diagnosis not present

## 2016-06-30 ENCOUNTER — Ambulatory Visit (INDEPENDENT_AMBULATORY_CARE_PROVIDER_SITE_OTHER): Payer: Medicare Other | Admitting: Physician Assistant

## 2016-06-30 ENCOUNTER — Encounter: Payer: Self-pay | Admitting: Physician Assistant

## 2016-06-30 VITALS — BP 124/80 | HR 100 | Temp 98.9°F | Resp 14 | Ht 65.0 in | Wt 143.0 lb

## 2016-06-30 DIAGNOSIS — N189 Chronic kidney disease, unspecified: Secondary | ICD-10-CM | POA: Diagnosis not present

## 2016-06-30 DIAGNOSIS — G929 Unspecified toxic encephalopathy: Secondary | ICD-10-CM

## 2016-06-30 DIAGNOSIS — G92 Toxic encephalopathy: Secondary | ICD-10-CM

## 2016-06-30 LAB — COMPREHENSIVE METABOLIC PANEL
ALBUMIN: 3.8 g/dL (ref 3.5–5.2)
ALK PHOS: 43 U/L (ref 39–117)
ALT: 16 U/L (ref 0–35)
AST: 15 U/L (ref 0–37)
BUN: 34 mg/dL — AB (ref 6–23)
CHLORIDE: 106 meq/L (ref 96–112)
CO2: 26 mEq/L (ref 19–32)
CREATININE: 3.01 mg/dL — AB (ref 0.40–1.20)
Calcium: 9.7 mg/dL (ref 8.4–10.5)
GFR: 20.51 mL/min — ABNORMAL LOW (ref >60.00)
Glucose, Bld: 162 mg/dL — ABNORMAL HIGH (ref 70–99)
Potassium: 6 mEq/L — ABNORMAL HIGH (ref 3.5–5.1)
SODIUM: 139 meq/L (ref 135–145)
TOTAL PROTEIN: 6.5 g/dL (ref 6.0–8.3)
Total Bilirubin: 0.4 mg/dL (ref 0.2–1.2)

## 2016-06-30 LAB — CBC
HCT: 32.1 % — ABNORMAL LOW (ref 36.0–46.0)
Hemoglobin: 10.6 g/dL — ABNORMAL LOW (ref 12.0–15.0)
MCHC: 33 g/dL (ref 30.0–36.0)
MCV: 87.4 fl (ref 78.0–100.0)
Platelets: 373 10*3/uL (ref 150.0–400.0)
RBC: 3.68 Mil/uL — AB (ref 3.87–5.11)
RDW: 16.3 % — ABNORMAL HIGH (ref 11.5–15.5)
WBC: 8.6 10*3/uL (ref 4.0–10.5)

## 2016-06-30 NOTE — Progress Notes (Signed)
Patient presents to clinic today for hospital follow-up. Patient presented to ER on 06/18/16 for hallucinations and acute confusion. Was assess in ER and subsequently admitted for further assessment and management of toxic encephalopathy. Symptoms initially thought to be related to Klonopin withdrawal. EEG obtained during admission that was overall unremarkable except for some normal wave changes from benzo use. MRI obtained revealing concern for MS versus Graft/Host Disease. CSF study revealed oligoclonal bands consistent with MS. Neurology consulted and recommended outpatient follow-up. Symptoms resolved during hospitalization with marked improvement after restarting benzodiazepines. Patient was discharged to follow-up with PCP, Neurology and Nephrology.   Since discharge patient endorses doing well overall. Is eating and sleeping well. Denies fever, chills, malaise or fatigue. Is taking medications as directed. Denies AMS, jitteriness, anxiety or depressed mood. States she is staying with a relative at the moment. Denies any change in urination or bowel movements. Denies abdominal pain, headache, vision changes. Denies lightheadedness or dizziness. Notes one elevated BP measurement at home but otherwise has been normal. Has follow-up scheduled with her Nephrologist (Dr. Jimmy Footman) and Neurology (Dr. Jannifer Franklin).  Past Medical History:  Diagnosis Date  . Adenomatous colon polyp 04/1998  . Anemia   . Carotid artery disease ([place])    Carotid US 1/18: bilateral ICA 1-39, R thyroid lobe nodule (1.9x2.2x3cm); numerous L thyroid lobe nodules - repeat 1 year  . Chronic kidney disease   . Chronic renal failure    post transplant  . Diabetes mellitus   . Esophagitis    Grade 1 Distal  . GERD (gastroesophageal reflux disease)   . Hemorrhoids   . Hx of cardiovascular stress test    Lexiscan Myoview (06/2013):  No ischemia, EF 66%; normal.  //  Myoview 12/17: EF 62, no ischemia or scar; Normal  . Hx of  echocardiogram    a. Echocardiogram (06/2013):  Mod focal basal hypertrophy, EF 60-65%, normal wall motion, Gr 1 DD, mild AI, mildly dilated ascending aorta (41 mm), mild LAE.; b.  Echo 9/16: mod LVH, EF 60-65%, no RWMA, Gr 1 DD, trivial AI, mild dilated ascending aorta, mild LAE  . Hyperkalemia   . Hyperlipidemia   . Hypertension   . Metabolic acidosis   . Pneumonia   . Seizures ([place])     Current Outpatient Prescriptions on File Prior to Visit  Medication Sig Dispense Refill  . acetaminophen (TYLENOL) 500 MG tablet Take 1,000 mg by mouth every 6 (six) hours as needed for moderate pain.    [name] amLODipine (NORVASC) 10 MG tablet Take 10 mg by mouth daily.    . cholecalciferol (VITAMIN D) 1000 units tablet Take 1,000 Units by mouth every other day.    . clonazePAM (KLONOPIN) 1 MG tablet Take 1 tablet (1 mg total) by mouth 2 (two) times daily. 60 tablet 1  . cyclobenzaprine (FLEXERIL) 10 MG tablet TAKE 1 TABLET (10 MG TOTAL) BY MOUTH 3 (THREE) TIMES DAILY AS NEEDED FOR MUSCLE SPASMS. 30 tablet 0  . escitalopram (LEXAPRO) 10 MG tablet Take 1 tablet (10 mg total) by mouth daily. 30 tablet 3  . fenofibrate 160 MG tablet Take 160 mg by mouth daily.    . furosemide (LASIX) 40 MG tablet 1 tab twice a day 60 tablet 1  . hydrALAZINE (APRESOLINE) 50 MG tablet Take 1 tablet (50 mg total) by mouth 3 (three) times daily. 90 tablet 1  . isosorbide mononitrate (IMDUR) 60 MG 24 hr tablet Take 1 tablet (60 mg total) by mouth daily. 30 tablet  1  . losartan (COZAAR) 100 MG tablet Take 1 tablet (100 mg total) by mouth daily. 30 tablet 1  . Magnesium 250 MG TABS Take 500 mg by mouth daily.    . meclizine (ANTIVERT) 25 MG tablet TAKE 1 TABLET (25 MG TOTAL) BY MOUTH 3 (THREE) TIMES DAILY AS NEEDED FOR DIZZINESS. 45 tablet 0  . Multiple Vitamin (MULTIVITAMIN) tablet Take 1 tablet by mouth daily.      . mycophenolate (CELLCEPT) 250 MG capsule Take 750 mg by mouth 2 (two) times daily.     . nitroGLYCERIN (NITROSTAT) 0.4  MG SL tablet Place 1 tablet (0.4 mg total) under the tongue every 5 (five) minutes as needed for chest pain. 25 tablet 3  . potassium chloride SA (K-DUR,KLOR-CON) 20 MEQ tablet Take 1 tablet (20 mEq total) by mouth 2 (two) times daily. 180 tablet 1  . predniSONE (DELTASONE) 5 MG tablet Take 5 mg by mouth every Monday, Wednesday, and Friday.    . tacrolimus (PROGRAF) 1 MG capsule Take 5 mg by mouth 2 (two) times daily.      No current facility-administered medications on file prior to visit.     Allergies  Allergen Reactions  . Lisinopril Shortness Of Breath, Swelling and Other (See Comments)    Throat irritation also. Patient takes losartan and tolerates fine.  [name] [Oxaprozin] Dermatitis and Other (See Comments)    hives    Family History  Problem Relation Age of Onset  . Kidney disease Paternal Aunt   . Heart disease Mother   . Heart disease Father   . Colon cancer Neg Hx     Social History   Social History  . Marital status: Widowed    Spouse name: N/A  . Number of children: 2  . Years of education: 12   Occupational History  . [employer]     Social History Main Topics  . Smoking status: Never Smoker  . Smokeless tobacco: Never Used  . Alcohol use No  . Drug use: No  . Sexual activity: No   Other Topics Concern  . None   Social History Narrative   Denies caffeine use    Review of Systems - See HPI.  All other ROS are negative.  BP 124/80   Pulse 100   Temp 98.9 F (37.2 C) (Oral)   Resp 14   Ht '5\' 5"'  (1.651 m)   Wt 143 lb (64.9 kg)   SpO2 98%   BMI 23.80 kg/m   Physical Exam  Constitutional: She is oriented to person, place, and time and well-developed, well-nourished, and in no distress.  HENT:  Head: Normocephalic and atraumatic.  Eyes: Conjunctivae are normal. Pupils are equal, round, and reactive to light.  Neck: Neck supple.  Cardiovascular: Normal rate, regular rhythm, normal heart sounds and intact distal pulses.   Pulmonary/Chest:  Effort normal and breath sounds normal. No respiratory distress. She has no wheezes. She has no rales. She exhibits no tenderness.  Neurological: She is alert and oriented to person, place, and time. No cranial nerve deficit. Coordination normal.  Skin: Skin is warm and dry. No rash noted.  Vitals reviewed.  Recent Results (from the past 2160 hour(s))  Hemoglobin A1c     Status: None   Collection Time: 05/19/16 11:15 AM  Result Value Ref Range   Hgb A1c MFr Bld 6.5 4.6 - 6.5 %    Comment: Glycemic Control Guidelines for People with Diabetes:Non Diabetic:  <6%Goal of Therapy: <7%Additional  Action Suggested:  >6%   Basic metabolic panel     Status: Abnormal   Collection Time: 05/19/16 11:15 AM  Result Value Ref Range   Sodium 135 135 - 145 mEq/L   Potassium 3.8 3.5 - 5.1 mEq/L   Chloride 97 96 - 112 mEq/L   CO2 28 19 - 32 mEq/L   Glucose, Bld 69 (L) 70 - 99 mg/dL   BUN 29 (H) 6 - 23 mg/dL   Creatinine, Ser 2.63 (H) 0.40 - 1.20 mg/dL   Calcium 9.4 8.4 - 10.5 mg/dL   GFR 23.98 (L) >60.00 mL/min  RPR     Status: None   Collection Time: 05/26/16  1:59 PM  Result Value Ref Range   RPR Ser Ql Non Reactive Non Reactive  Sedimentation rate     Status: None   Collection Time: 05/26/16  1:59 PM  Result Value Ref Range   Sed Rate 34 0 - 40 mm/hr  HIV antibody     Status: None   Collection Time: 05/26/16  1:59 PM  Result Value Ref Range   HIV Screen 4th Generation wRfx Non Reactive Non Reactive  Angiotensin converting enzyme     Status: None   Collection Time: 05/26/16  1:59 PM  Result Value Ref Range   Angio Convert Enzyme 32 14 - 82 U/L  ANA w/Reflex     Status: None   Collection Time: 05/26/16  1:59 PM  Result Value Ref Range   Anit Nuclear Antibody(ANA) Negative Negative  B. burgdorfi antibodies     Status: None   Collection Time: 05/26/16  1:59 PM  Result Value Ref Range   Lyme IgG/IgM Ab <0.91 0.00 - 0.90 ISR    Comment:                                 Negative         <0.91                                  Equivocal  0.91 - 1.09                                 Positive         >1.09   Pan-ANCA     Status: None   Collection Time: 05/26/16  2:02 PM  Result Value Ref Range   Myeloperoxidase Ab <9.0 0.0 - 9.0 U/mL   ANCA Proteinase 3 <3.5 0.0 - 3.5 U/mL   C-ANCA <1:20 Neg:<1:20 titer   P-ANCA <1:20 Neg:<1:20 titer    Comment: The presence of positive fluorescence exhibiting P-ANCA or C-ANCA patterns alone is not specific for the diagnosis of Wegener's Granulomatosis (WG) or microscopic polyangiitis. Decisions about treatment should not be based solely on ANCA IFA results.  The International ANCA Group Consensus recommends follow up testing of positive sera with both PR-3 and MPO-ANCA enzyme immunoassays. As many as 5% serum samples are positive only by EIA. Ref. AM J Clin Pathol 1999;111:507-513.    Atypical pANCA <1:20 Neg:<1:20 titer    Comment: The atypical pANCA pattern has been observed in a significant percentage of patients with ulcerative colitis, primary sclerosing cholangitis and autoimmune hepatitis.   CSF cell count with differential     Status: Abnormal   Collection Time: 06/02/16  8:56 AM  Result Value Ref Range   Color, CSF COLORLESS COLORLESS   Appearance, CSF CLEAR CLEAR   RBC Count, CSF 36 (H) 0 - 10 cells/uL   WBC, CSF 1 0 - 5 cells/uL   Comment: SEE NOTE     Comment: Too few to count, smear available for review. Performed from tube 4.   Protein, CSF     Status: Abnormal   Collection Time: 06/02/16  8:56 AM  Result Value Ref Range   Total Protein, CSF 72 (H) 15 - 45 mg/dL  Glucose, CSF     Status: None   Collection Time: 06/02/16  8:56 AM  Result Value Ref Range   Glucose, CSF 53 43 - 76 mg/dL  VDRL, CSF     Status: None   Collection Time: 06/02/16  8:56 AM  Result Value Ref Range   VDRL Quant, CSF Nonreactive Nonreactive  Oligoclonal bands, CSF + serm     Status: None   Collection Time: 06/02/16  8:56 AM  Result Value Ref  Range   CSF Oligoclonal Bands REPORT     Comment: Bands noted            Reference Range: No bands The patients CSF contains >5 well defined gamma restriction bands that are not present in the patient's corresponding serum sample.  These bands indicate abnormal synthesis of gammaglobulins in the central nervous system.  This finding is supportive evidence of multiple sclerosis, but should be interpreted in conjunction with all clinical and laboratory data pertaining to this patient. Oligoclonal bands are present in the CSF of more than 85% of patients with clinically definite multiple sclerosis (MS). To distinguish between oligoclonal bands in the CSF due to a peripheral gammopathy and oligoclonal bands due to local production in the CNS, serum and CSF should be tested simultaneously. Oligoclonal bands can however be observed in a variety of other diseases, e.g., subacute sclerosing panen- cephalitis, inflammatory polyneuropathy, CNS lupus, and brain tumors and infarctions. The clinical significance of a numerical b and count, determined by isoelectric focusing, has not been definitively defined. The data should be interpreted in conjunction with all pertinent clinical and laboratory data for this patient.   Angiotensin converting enzyme, CSF     Status: None   Collection Time: 06/02/16  8:56 AM  Result Value Ref Range   ACE, CSF 9 <=15 U/L  Basic metabolic panel     Status: Abnormal   Collection Time: 06/11/16  2:36 PM  Result Value Ref Range   Sodium 138 135 - 145 mmol/L   Potassium 3.3 (L) 3.5 - 5.1 mmol/L   Chloride 99 (L) 101 - 111 mmol/L   CO2 26 22 - 32 mmol/L   Glucose, Bld 134 (H) 65 - 99 mg/dL   BUN 32 (H) 6 - 20 mg/dL   Creatinine, Ser 2.13 (H) 0.44 - 1.00 mg/dL   Calcium 10.3 8.9 - 10.3 mg/dL   GFR calc non Af Amer 24 (L) >60 mL/min   GFR calc Af Amer 28 (L) >60 mL/min    Comment: (NOTE) The eGFR has been calculated using the CKD EPI equation. This  calculation has not been validated in all clinical situations. eGFR's persistently <60 mL/min signify possible Chronic Kidney Disease.    Anion gap 13 5 - 15  CBC with Differential     Status: None   Collection Time: 06/11/16  2:36 PM  Result Value Ref Range   WBC 8.3 4.0 - 10.5 K/uL  RBC 4.29 3.87 - 5.11 MIL/uL   Hemoglobin 12.1 12.0 - 15.0 g/dL   HCT 36.7 36.0 - 46.0 %   MCV 85.5 78.0 - 100.0 fL   MCH 28.2 26.0 - 34.0 pg   MCHC 33.0 30.0 - 36.0 g/dL   RDW 13.9 11.5 - 15.5 %   Platelets 269 150 - 400 K/uL   Neutrophils Relative % 66 %   Neutro Abs 5.5 1.7 - 7.7 K/uL   Lymphocytes Relative 24 %   Lymphs Abs 2.0 0.7 - 4.0 K/uL   Monocytes Relative 9 %   Monocytes Absolute 0.7 0.1 - 1.0 K/uL   Eosinophils Relative 1 %   Eosinophils Absolute 0.1 0.0 - 0.7 K/uL   Basophils Relative 0 %   Basophils Absolute 0.0 0.0 - 0.1 K/uL  Urinalysis, Routine w reflex microscopic     Status: Abnormal   Collection Time: 06/11/16  4:18 PM  Result Value Ref Range   Color, Urine STRAW (A) YELLOW   APPearance CLEAR CLEAR   Specific Gravity, Urine 1.006 1.005 - 1.030   pH 6.0 5.0 - 8.0   Glucose, UA NEGATIVE NEGATIVE mg/dL   Hgb urine dipstick NEGATIVE NEGATIVE   Bilirubin Urine NEGATIVE NEGATIVE   Ketones, ur NEGATIVE NEGATIVE mg/dL   Protein, ur 100 (A) NEGATIVE mg/dL   Nitrite NEGATIVE NEGATIVE   Leukocytes, UA NEGATIVE NEGATIVE   RBC / HPF 0-5 0 - 5 RBC/hpf   WBC, UA 0-5 0 - 5 WBC/hpf   Bacteria, UA NONE SEEN NONE SEEN   Squamous Epithelial / LPF 0-5 (A) NONE SEEN  CBC with Differential     Status: Abnormal   Collection Time: 06/18/16  5:49 AM  Result Value Ref Range   WBC 7.0 4.0 - 10.5 K/uL   RBC 3.73 (L) 3.87 - 5.11 MIL/uL   Hemoglobin 10.3 (L) 12.0 - 15.0 g/dL   HCT 31.7 (L) 36.0 - 46.0 %   MCV 85.0 78.0 - 100.0 fL   MCH 27.6 26.0 - 34.0 pg   MCHC 32.5 30.0 - 36.0 g/dL   RDW 14.1 11.5 - 15.5 %   Platelets 216 150 - 400 K/uL   Neutrophils Relative % 68 %   Neutro Abs 4.7 1.7  - 7.7 K/uL   Lymphocytes Relative 22 %   Lymphs Abs 1.6 0.7 - 4.0 K/uL   Monocytes Relative 9 %   Monocytes Absolute 0.6 0.1 - 1.0 K/uL   Eosinophils Relative 1 %   Eosinophils Absolute 0.1 0.0 - 0.7 K/uL   Basophils Relative 0 %   Basophils Absolute 0.0 0.0 - 0.1 K/uL  Comprehensive metabolic panel     Status: Abnormal   Collection Time: 06/18/16  5:49 AM  Result Value Ref Range   Sodium 145 135 - 145 mmol/L   Potassium 3.1 (L) 3.5 - 5.1 mmol/L   Chloride 112 (H) 101 - 111 mmol/L   CO2 23 22 - 32 mmol/L   Glucose, Bld 128 (H) 65 - 99 mg/dL   BUN 28 (H) 6 - 20 mg/dL   Creatinine, Ser 2.50 (H) 0.44 - 1.00 mg/dL   Calcium 9.1 8.9 - 10.3 mg/dL   Total Protein 7.1 6.5 - 8.1 g/dL   Albumin 3.8 3.5 - 5.0 g/dL   AST 17 15 - 41 U/L   ALT 19 14 - 54 U/L   Alkaline Phosphatase 56 38 - 126 U/L   Total Bilirubin 0.6 0.3 - 1.2 mg/dL   GFR calc non Af  Amer 20 (L) >60 mL/min   GFR calc Af Amer 23 (L) >60 mL/min    Comment: (NOTE) The eGFR has been calculated using the CKD EPI equation. This calculation has not been validated in all clinical situations. eGFR's persistently <60 mL/min signify possible Chronic Kidney Disease.    Anion gap 10 5 - 15  Urinalysis, Routine w reflex microscopic     Status: Abnormal   Collection Time: 06/18/16  5:49 AM  Result Value Ref Range   Color, Urine YELLOW YELLOW   APPearance CLEAR CLEAR   Specific Gravity, Urine 1.010 1.005 - 1.030   pH 6.0 5.0 - 8.0   Glucose, UA NEGATIVE NEGATIVE mg/dL   Hgb urine dipstick NEGATIVE NEGATIVE   Bilirubin Urine NEGATIVE NEGATIVE   Ketones, ur NEGATIVE NEGATIVE mg/dL   Protein, ur >=300 (A) NEGATIVE mg/dL   Nitrite NEGATIVE NEGATIVE   Leukocytes, UA NEGATIVE NEGATIVE   RBC / HPF 0-5 0 - 5 RBC/hpf   WBC, UA 0-5 0 - 5 WBC/hpf   Bacteria, UA RARE (A) NONE SEEN   Squamous Epithelial / LPF 0-5 (A) NONE SEEN  Ammonia     Status: None   Collection Time: 06/18/16  5:49 AM  Result Value Ref Range   Ammonia 17 9 - 35  umol/L  I-Stat CG4 Lactic Acid, ED     Status: None   Collection Time: 06/18/16  6:07 AM  Result Value Ref Range   Lactic Acid, Venous 0.73 0.5 - 1.9 mmol/L  CBG monitoring, ED     Status: Abnormal   Collection Time: 06/18/16  6:31 AM  Result Value Ref Range   Glucose-Capillary 125 (H) 65 - 99 mg/dL  CBC     Status: Abnormal   Collection Time: 06/18/16  9:42 AM  Result Value Ref Range   WBC 7.3 4.0 - 10.5 K/uL   RBC 3.65 (L) 3.87 - 5.11 MIL/uL   Hemoglobin 10.3 (L) 12.0 - 15.0 g/dL   HCT 31.7 (L) 36.0 - 46.0 %   MCV 86.8 78.0 - 100.0 fL   MCH 28.2 26.0 - 34.0 pg   MCHC 32.5 30.0 - 36.0 g/dL   RDW 14.3 11.5 - 15.5 %   Platelets 225 150 - 400 K/uL  Creatinine, serum     Status: Abnormal   Collection Time: 06/18/16  9:42 AM  Result Value Ref Range   Creatinine, Ser 2.50 (H) 0.44 - 1.00 mg/dL   GFR calc non Af Amer 20 (L) >60 mL/min   GFR calc Af Amer 23 (L) >60 mL/min    Comment: (NOTE) The eGFR has been calculated using the CKD EPI equation. This calculation has not been validated in all clinical situations. eGFR's persistently <60 mL/min signify possible Chronic Kidney Disease.   Hemoglobin A1c     Status: Abnormal   Collection Time: 06/18/16  9:42 AM  Result Value Ref Range   Hgb A1c MFr Bld 6.2 (H) 4.8 - 5.6 %    Comment: (NOTE)         Pre-diabetes: 5.7 - 6.4         Diabetes: >6.4         Glycemic control for adults with diabetes: <7.0    Mean Plasma Glucose 131 mg/dL    Comment: (NOTE) Performed At: [facility] [place], [place] [id number] Lindon Romp MD YS:[id number]   Tacrolimus level     Status: Abnormal   Collection Time: 06/18/16  9:42 AM  Result Value  Ref Range   Tacrolimus (FK506) - [facility] 1.0 (L) 2.0 - 20.0 ng/mL    Comment: (NOTE)        Trough (immediately following                transplant)                       15.0        Trough (steady state, 2 weeks or                more after transplant):      3.0 - 8.0                                  Detection Limit = 1.0        Performed by LC-MS/MS technology. Performed At: [facility] [place], [place] [id number] Lindon Romp MD [id number]   Mycophenolic Acid (CellCept)     Status: Abnormal   Collection Time: 06/18/16  9:42 AM  Result Value Ref Range   MPA 7.0 (H) 1.0 - 3.5 ug/mL   MPA Glucuronide 97 15 - 125 ug/mL    Comment: (NOTE) Performed At: [facility] [place], [place] [id number] Lindon Romp MD [id number]   Glucose, capillary     Status: Abnormal   Collection Time: 06/18/16 11:17 AM  Result Value Ref Range   Glucose-Capillary 121 (H) 65 - 99 mg/dL  Glucose, capillary     Status: Abnormal   Collection Time: 06/18/16  5:10 PM  Result Value Ref Range   Glucose-Capillary 166 (H) 65 - 99 mg/dL  Glucose, capillary     Status: Abnormal   Collection Time: 06/18/16  9:24 PM  Result Value Ref Range   Glucose-Capillary 121 (H) 65 - 99 mg/dL  Basic metabolic panel     Status: Abnormal   Collection Time: 06/19/16  5:31 AM  Result Value Ref Range   Sodium 146 (H) 135 - 145 mmol/L   Potassium 3.4 (L) 3.5 - 5.1 mmol/L   Chloride 115 (H) 101 - 111 mmol/L   CO2 22 22 - 32 mmol/L   Glucose, Bld 122 (H) 65 - 99 mg/dL   BUN 29 (H) 6 - 20 mg/dL   Creatinine, Ser 2.27 (H) 0.44 - 1.00 mg/dL   Calcium 8.8 (L) 8.9 - 10.3 mg/dL   GFR calc non Af Amer 23 (L) >60 mL/min   GFR calc Af Amer 26 (L) >60 mL/min    Comment: (NOTE) The eGFR has been calculated using the CKD EPI equation. This calculation has not been validated in all clinical situations. eGFR's persistently <60 mL/min signify possible Chronic Kidney Disease.    Anion gap 9 5 - 15  Glucose, capillary     Status: Abnormal   Collection Time: 06/19/16  7:27 AM  Result Value Ref Range   Glucose-Capillary 125 (H) 65 - 99 mg/dL  Glucose, capillary     Status: Abnormal   Collection Time: 06/19/16 11:32 AM  Result Value Ref Range    Glucose-Capillary 134 (H) 65 - 99 mg/dL  Glucose, capillary     Status: Abnormal   Collection Time: 06/19/16  4:47 PM  Result Value Ref Range   Glucose-Capillary 121 (H) 65 - 99 mg/dL  Glucose, capillary     Status: Abnormal   Collection Time: 06/20/16  7:04 AM  Result Value  Ref Range   Glucose-Capillary 133 (H) 65 - 99 mg/dL   Comment 1 Notify RN    Comment 2 Document in Chart   Basic metabolic panel     Status: Abnormal   Collection Time: 06/20/16  9:48 AM  Result Value Ref Range   Sodium 144 135 - 145 mmol/L   Potassium 3.6 3.5 - 5.1 mmol/L   Chloride 117 (H) 101 - 111 mmol/L   CO2 20 (L) 22 - 32 mmol/L   Glucose, Bld 127 (H) 65 - 99 mg/dL   BUN 24 (H) 6 - 20 mg/dL   Creatinine, Ser 2.09 (H) 0.44 - 1.00 mg/dL   Calcium 9.0 8.9 - 10.3 mg/dL   GFR calc non Af Amer 25 (L) >60 mL/min   GFR calc Af Amer 29 (L) >60 mL/min    Comment: (NOTE) The eGFR has been calculated using the CKD EPI equation. This calculation has not been validated in all clinical situations. eGFR's persistently <60 mL/min signify possible Chronic Kidney Disease.    Anion gap 7 5 - 15  Glucose, capillary     Status: Abnormal   Collection Time: 06/20/16 12:14 PM  Result Value Ref Range   Glucose-Capillary 155 (H) 65 - 99 mg/dL  Glucose, capillary     Status: Abnormal   Collection Time: 06/20/16  4:17 PM  Result Value Ref Range   Glucose-Capillary 152 (H) 65 - 99 mg/dL  Glucose, capillary     Status: Abnormal   Collection Time: 06/20/16  8:23 PM  Result Value Ref Range   Glucose-Capillary 141 (H) 65 - 99 mg/dL  Glucose, capillary     Status: Abnormal   Collection Time: 06/21/16  7:28 AM  Result Value Ref Range   Glucose-Capillary 117 (H) 65 - 99 mg/dL  Comprehensive metabolic panel     Status: Abnormal   Collection Time: 06/21/16  9:03 AM  Result Value Ref Range   Sodium 141 135 - 145 mmol/L   Potassium 4.1 3.5 - 5.1 mmol/L   Chloride 113 (H) 101 - 111 mmol/L   CO2 23 22 - 32 mmol/L   Glucose, Bld  174 (H) 65 - 99 mg/dL   BUN 24 (H) 6 - 20 mg/dL   Creatinine, Ser 2.17 (H) 0.44 - 1.00 mg/dL   Calcium 9.3 8.9 - 10.3 mg/dL   Total Protein 6.5 6.5 - 8.1 g/dL   Albumin 3.5 3.5 - 5.0 g/dL   AST 17 15 - 41 U/L   ALT 18 14 - 54 U/L   Alkaline Phosphatase 51 38 - 126 U/L   Total Bilirubin 0.5 0.3 - 1.2 mg/dL   GFR calc non Af Amer 24 (L) >60 mL/min   GFR calc Af Amer 28 (L) >60 mL/min    Comment: (NOTE) The eGFR has been calculated using the CKD EPI equation. This calculation has not been validated in all clinical situations. eGFR's persistently <60 mL/min signify possible Chronic Kidney Disease.    Anion gap 5 5 - 15  Glucose, capillary     Status: Abnormal   Collection Time: 06/21/16 11:54 AM  Result Value Ref Range   Glucose-Capillary 123 (H) 65 - 99 mg/dL  Comprehensive metabolic panel     Status: Abnormal   Collection Time: 06/21/16  3:37 PM  Result Value Ref Range   Sodium 140 135 - 145 mmol/L   Potassium 4.2 3.5 - 5.1 mmol/L   Chloride 114 (H) 101 - 111 mmol/L   CO2 21 (L) 22 -  32 mmol/L   Glucose, Bld 109 (H) 65 - 99 mg/dL   BUN 25 (H) 6 - 20 mg/dL   Creatinine, Ser 2.08 (H) 0.44 - 1.00 mg/dL   Calcium 8.8 (L) 8.9 - 10.3 mg/dL   Total Protein 6.2 (L) 6.5 - 8.1 g/dL   Albumin 3.3 (L) 3.5 - 5.0 g/dL   AST 19 15 - 41 U/L   ALT 19 14 - 54 U/L   Alkaline Phosphatase 46 38 - 126 U/L   Total Bilirubin 0.5 0.3 - 1.2 mg/dL   GFR calc non Af Amer 25 (L) >60 mL/min   GFR calc Af Amer 29 (L) >60 mL/min    Comment: (NOTE) The eGFR has been calculated using the CKD EPI equation. This calculation has not been validated in all clinical situations. eGFR's persistently <60 mL/min signify possible Chronic Kidney Disease.    Anion gap 5 5 - 15  Mycophenolic Acid (CellCept)     Status: None   Collection Time: 06/21/16  3:37 PM  Result Value Ref Range   MPA 2.4 1.0 - 3.5 ug/mL   MPA Glucuronide 53 15 - 125 ug/mL    Comment: (NOTE) Performed At: [facility] [place], [place] [id number] Lindon Romp MD TZ:[id number]   Glucose, capillary     Status: Abnormal   Collection Time: 06/21/16  4:49 PM  Result Value Ref Range   Glucose-Capillary 116 (H) 65 - 99 mg/dL  Glucose, capillary     Status: Abnormal   Collection Time: 06/21/16  9:35 PM  Result Value Ref Range   Glucose-Capillary 118 (H) 65 - 99 mg/dL  CBC     Status: Abnormal   Collection Time: 06/22/16  5:52 AM  Result Value Ref Range   WBC 6.3 4.0 - 10.5 K/uL   RBC 3.30 (L) 3.87 - 5.11 MIL/uL   Hemoglobin 9.6 (L) 12.0 - 15.0 g/dL   HCT 28.9 (L) 36.0 - 46.0 %   MCV 87.6 78.0 - 100.0 fL   MCH 29.1 26.0 - 34.0 pg   MCHC 33.2 30.0 - 36.0 g/dL   RDW 14.2 11.5 - 15.5 %   Platelets 221 150 - 400 K/uL  Glucose, capillary     Status: None   Collection Time: 06/22/16  7:40 AM  Result Value Ref Range   Glucose-Capillary 98 65 - 99 mg/dL  Glucose, capillary     Status: Abnormal   Collection Time: 06/22/16 11:34 AM  Result Value Ref Range   Glucose-Capillary 100 (H) 65 - 99 mg/dL  Glucose, capillary     Status: Abnormal   Collection Time: 06/22/16  5:30 PM  Result Value Ref Range   Glucose-Capillary 101 (H) 65 - 99 mg/dL  Glucose, capillary     Status: Abnormal   Collection Time: 06/22/16  9:57 PM  Result Value Ref Range   Glucose-Capillary 132 (H) 65 - 99 mg/dL  Miscellaneous [facility] test (send-out)     Status: None   Collection Time: 06/23/16  5:00 AM  Result Value Ref Range   [facility] test code MAYO SERUM ENS1    [facility] test name ENCEPHALOPATHY PANEL    Misc [facility] result COMMENT     Comment: (NOTE) Performed At: [facility] [place], [place] [id number] Lindon Romp MD KZ:[id number]   Basic metabolic panel     Status: Abnormal   Collection Time: 06/23/16  5:00 AM  Result Value Ref Range   Sodium 136 135 - 145  mmol/L   Potassium 4.8 3.5 - 5.1 mmol/L   Chloride 111 101 - 111 mmol/L   CO2 19 (L) 22 - 32 mmol/L   Glucose, Bld 109 (H) 65 - 99  mg/dL   BUN 27 (H) 6 - 20 mg/dL   Creatinine, Ser 2.41 (H) 0.44 - 1.00 mg/dL   Calcium 9.0 8.9 - 10.3 mg/dL   GFR calc non Af Amer 21 (L) >60 mL/min   GFR calc Af Amer 24 (L) >60 mL/min    Comment: (NOTE) The eGFR has been calculated using the CKD EPI equation. This calculation has not been validated in all clinical situations. eGFR's persistently <60 mL/min signify possible Chronic Kidney Disease.    Anion gap 6 5 - 15  Glucose, capillary     Status: None   Collection Time: 06/23/16  7:40 AM  Result Value Ref Range   Glucose-Capillary 98 65 - 99 mg/dL  Glucose, capillary     Status: Abnormal   Collection Time: 06/23/16 12:47 PM  Result Value Ref Range   Glucose-Capillary 103 (H) 65 - 99 mg/dL  Glucose, capillary     Status: Abnormal   Collection Time: 06/23/16  4:28 PM  Result Value Ref Range   Glucose-Capillary 128 (H) 65 - 99 mg/dL  CBC     Status: Abnormal   Collection Time: 06/30/16  2:30 PM  Result Value Ref Range   WBC 8.6 4.0 - 10.5 K/uL   RBC 3.68 (L) 3.87 - 5.11 Mil/uL   Platelets 373.0 150.0 - 400.0 K/uL   Hemoglobin 10.6 (L) 12.0 - 15.0 g/dL   HCT 32.1 (L) 36.0 - 46.0 %   MCV 87.4 78.0 - 100.0 fl   MCHC 33.0 30.0 - 36.0 g/dL   RDW 16.3 (H) 11.5 - 15.5 %  Comp Met (CMET)     Status: Abnormal   Collection Time: 06/30/16  2:30 PM  Result Value Ref Range   Sodium 139 135 - 145 mEq/L   Potassium 6.0 (H) 3.5 - 5.1 mEq/L   Chloride 106 96 - 112 mEq/L   CO2 26 19 - 32 mEq/L   Glucose, Bld 162 (H) 70 - 99 mg/dL   BUN 34 (H) 6 - 23 mg/dL   Creatinine, Ser 3.01 (H) 0.40 - 1.20 mg/dL   Total Bilirubin 0.4 0.2 - 1.2 mg/dL   Alkaline Phosphatase 43 39 - 117 U/L   AST 15 0 - 37 U/L   ALT 16 0 - 35 U/L   Total Protein 6.5 6.0 - 8.3 g/dL   Albumin 3.8 3.5 - 5.2 g/dL   Calcium 9.7 8.4 - 10.5 mg/dL   GFR 20.51 (L) >60.00 mL/min    Assessment/Plan: Toxic encephalopathy Resolved during hospitalization. Secondary to benzo cessation and withdrawal. Was restarted in  hospital. Taking Klonopin BID. No recurrent symptoms. Has outpatient neurology appt scheduled due to findings consistent with MS. Repeat labs today. Strict return precautions given. FU with specialists as well.  Chronic kidney disease Followed by Nephrology. Improvement in renal function during hospitalization. Losartan was restarted at discharge. Will repeat BMP today. Appointment with nephrology scheduled.    Leeanne Rio, PA-C

## 2016-06-30 NOTE — Patient Instructions (Addendum)
Please go to the lab for blood work. We will call with your results.  Please continue medications as directed.  Your BP looks great today. Please stop by the office at your convenience and bring your BP cuff so we can see if it is accurate. Another option is to get a new BP cuff and use to check BP. IF BP still elevated, please call or come see [place].   Please follow-up with Dr. Jannifer Franklin and Dr. Jimmy Footman as scheduled.  If there is any recurrence of confusion, please call [phone].  Per Dr. Birdie Riddle, please stop the Librium as you are taking the Klonopin twice daily as directed.

## 2016-06-30 NOTE — Progress Notes (Signed)
Pre visit review using our clinic review tool, if applicable. No additional management support is needed unless otherwise documented below in the visit note. 

## 2016-07-02 ENCOUNTER — Telehealth: Payer: Self-pay | Admitting: Family Medicine

## 2016-07-02 DIAGNOSIS — E119 Type 2 diabetes mellitus without complications: Secondary | ICD-10-CM | POA: Diagnosis not present

## 2016-07-02 DIAGNOSIS — I10 Essential (primary) hypertension: Secondary | ICD-10-CM | POA: Diagnosis not present

## 2016-07-02 DIAGNOSIS — E785 Hyperlipidemia, unspecified: Secondary | ICD-10-CM | POA: Diagnosis not present

## 2016-07-02 DIAGNOSIS — D649 Anemia, unspecified: Secondary | ICD-10-CM | POA: Diagnosis not present

## 2016-07-02 DIAGNOSIS — K219 Gastro-esophageal reflux disease without esophagitis: Secondary | ICD-10-CM | POA: Diagnosis not present

## 2016-07-02 DIAGNOSIS — G92 Toxic encephalopathy: Secondary | ICD-10-CM | POA: Diagnosis not present

## 2016-07-02 NOTE — Assessment & Plan Note (Signed)
Followed by Nephrology. Improvement in renal function during hospitalization. Losartan was restarted at discharge. Will repeat BMP today. Appointment with nephrology scheduled.

## 2016-07-02 NOTE — Telephone Encounter (Signed)
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

## 2016-07-02 NOTE — Assessment & Plan Note (Addendum)
Resolved during hospitalization. Secondary to benzo cessation and withdrawal. Was restarted in hospital. Taking Klonopin BID. No recurrent symptoms. Has outpatient neurology appt scheduled due to findings consistent with MS. Repeat labs today. Strict return precautions given. FU with specialists as well.

## 2016-07-03 ENCOUNTER — Encounter: Payer: Self-pay | Admitting: Neurology

## 2016-07-03 ENCOUNTER — Ambulatory Visit (INDEPENDENT_AMBULATORY_CARE_PROVIDER_SITE_OTHER): Payer: Medicare Other | Admitting: Neurology

## 2016-07-03 ENCOUNTER — Telehealth: Payer: Self-pay | Admitting: *Deleted

## 2016-07-03 VITALS — BP 153/101 | HR 106 | Ht 65.0 in | Wt 144.5 lb

## 2016-07-03 DIAGNOSIS — R9089 Other abnormal findings on diagnostic imaging of central nervous system: Secondary | ICD-10-CM | POA: Diagnosis not present

## 2016-07-03 DIAGNOSIS — R42 Dizziness and giddiness: Secondary | ICD-10-CM

## 2016-07-03 DIAGNOSIS — D6859 Other primary thrombophilia: Secondary | ICD-10-CM | POA: Diagnosis not present

## 2016-07-03 DIAGNOSIS — I679 Cerebrovascular disease, unspecified: Secondary | ICD-10-CM | POA: Diagnosis not present

## 2016-07-03 NOTE — Telephone Encounter (Signed)
Scheduled for routine lab pick up for JCV with [facility]. Confirmation# [id number].

## 2016-07-03 NOTE — Telephone Encounter (Signed)
Verbal ok given.

## 2016-07-03 NOTE — Patient Instructions (Signed)
   We are planning MRI of the cervical spine and a VER test.

## 2016-07-03 NOTE — Progress Notes (Signed)
Reason for visit: Dizziness  [name] [name] is an 61 y.o. female  History of present illness:  [name] [name] is a 61 year old left-handed black female with a history of end-stage renal disease, status post renal transplant, currently being treated with CellCept. The patient has been noted to have a progression of white matter changes in the brain from 2007 to the present. The patient has a patchy bilateral white matter abnormality seen on recent MRI brain scans. The patient has undergone lumbar puncture which revealed evidence of oligoclonal banding. Spinal fluid protein was also slightly elevated. The patient was set up for MRI of the cervical spine and a visual evoked response test to help confirm the diagnosis of multiple sclerosis. These studies have not yet been done. The patient has significant underlying anxiety, she is being treated with clonazepam taking 1 mg twice daily. The patient was admitted to the hospital on 06/18/2016 for altered mental status with tremors and hallucinations and emotional lability. This likely was a stress reaction associated with the potential diagnosis of multiple sclerosis. The patient had 2 MRI brain evaluations during that hospitalization that were unchanged from the original study. The patient returns to the office today for an evaluation. The patient reports that she still does have some dizziness when she first gets up out of bed, this lasts only a few moments. The patient has had some mild gait instability, but she remains on clonazepam taking 1 mg twice daily. The patient reports no changes in strength or sensation or vision.  Past Medical History:  Diagnosis Date  . Adenomatous colon polyp 04/1998  . Anemia   . Carotid artery disease ([place])    Carotid US 1/18: bilateral ICA 1-39, R thyroid lobe nodule (1.9x2.2x3cm); numerous L thyroid lobe nodules - repeat 1 year  . Chronic kidney disease   . Chronic renal failure    post transplant  . Diabetes  mellitus   . Esophagitis    Grade 1 Distal  . GERD (gastroesophageal reflux disease)   . Hemorrhoids   . Hx of cardiovascular stress test    Lexiscan Myoview (06/2013):  No ischemia, EF 66%; normal.  //  Myoview 12/17: EF 62, no ischemia or scar; Normal  . Hx of echocardiogram    a. Echocardiogram (06/2013):  Mod focal basal hypertrophy, EF 60-65%, normal wall motion, Gr 1 DD, mild AI, mildly dilated ascending aorta (41 mm), mild LAE.; b.  Echo 9/16: mod LVH, EF 60-65%, no RWMA, Gr 1 DD, trivial AI, mild dilated ascending aorta, mild LAE  . Hyperkalemia   . Hyperlipidemia   . Hypertension   . Metabolic acidosis   . Pneumonia   . Seizures ([place])     Past Surgical History:  Procedure Laterality Date  . ABDOMINAL HYSTERECTOMY    . AV FISTULA PLACEMENT  07/04/2005   Cimino AV fistula  . AV FISTULA PLACEMENT  08/27/2005  . AV FISTULA PLACEMENT W/ PTFE  08/27/2005  . CESAREAN SECTION    . DG AV DIALYSIS GRAFT DECLOT OR  07/24/2005   AV Gore-Tex graf  . DG AV DIALYSIS GRAFT DECLOT OR  Thrombosis right forearm, loop arteriovenous   Thrombosis right forearm, loop arteriovenous graft  . KIDNEY TRANSPLANT  2009   Both  . THROMBECTOMY / ARTERIOVENOUS GRAFT REVISION  10/12/2006  . THROMBECTOMY / ARTERIOVENOUS GRAFT REVISION  10/16/2006    Family History  Problem Relation Age of Onset  . Kidney disease Paternal Aunt   . Heart disease Mother   .  Heart disease Father   . Colon cancer Neg Hx     Social history:  reports that she has never smoked. She has never used smokeless tobacco. She reports that she does not drink alcohol or use drugs.    Allergies  Allergen Reactions  . Lisinopril Shortness Of Breath, Swelling and Other (See Comments)    Throat irritation also. Patient takes losartan and tolerates fine.  [name] [Oxaprozin] Dermatitis and Other (See Comments)    hives    Medications:  Prior to Admission medications   Medication Sig Start Date End Date Taking? Authorizing  Provider  acetaminophen (TYLENOL) 500 MG tablet Take 1,000 mg by mouth every 6 (six) hours as needed for moderate pain.   Yes Historical Provider, MD  amLODipine (NORVASC) 10 MG tablet Take 10 mg by mouth daily.   Yes Historical Provider, MD  cholecalciferol (VITAMIN D) 1000 units tablet Take 1,000 Units by mouth every other day.   Yes Historical Provider, MD  clonazePAM (KLONOPIN) 1 MG tablet Take 1 tablet (1 mg total) by mouth 2 (two) times daily. 06/23/16  Yes Courage Emokpae, MD  cyclobenzaprine (FLEXERIL) 10 MG tablet TAKE 1 TABLET (10 MG TOTAL) BY MOUTH 3 (THREE) TIMES DAILY AS NEEDED FOR MUSCLE SPASMS. 05/07/16  Yes Midge Minium, MD  escitalopram (LEXAPRO) 10 MG tablet Take 1 tablet (10 mg total) by mouth daily. 06/24/16  Yes Courage Emokpae, MD  fenofibrate 160 MG tablet Take 160 mg by mouth daily.   Yes Historical Provider, MD  furosemide (LASIX) 40 MG tablet 1 tab twice a day 06/23/16  Yes Courage Emokpae, MD  hydrALAZINE (APRESOLINE) 50 MG tablet Take 1 tablet (50 mg total) by mouth 3 (three) times daily. 06/23/16  Yes Courage Emokpae, MD  isosorbide mononitrate (IMDUR) 60 MG 24 hr tablet Take 1 tablet (60 mg total) by mouth daily. 06/23/16 06/18/17 Yes Courage Emokpae, MD  losartan (COZAAR) 100 MG tablet Take 1 tablet (100 mg total) by mouth daily. 06/24/16  Yes Courage Emokpae, MD  Magnesium 250 MG TABS Take 500 mg by mouth daily.   Yes Historical Provider, MD  meclizine (ANTIVERT) 25 MG tablet TAKE 1 TABLET (25 MG TOTAL) BY MOUTH 3 (THREE) TIMES DAILY AS NEEDED FOR DIZZINESS. 04/25/16  Yes Midge Minium, MD  Multiple Vitamin (MULTIVITAMIN) tablet Take 1 tablet by mouth daily.     Yes Historical Provider, MD  mycophenolate (CELLCEPT) 250 MG capsule Take 750 mg by mouth 2 (two) times daily.    Yes Historical Provider, MD  nitroGLYCERIN (NITROSTAT) 0.4 MG SL tablet Place 1 tablet (0.4 mg total) under the tongue every 5 (five) minutes as needed for chest pain. 06/16/13  Yes Liliane Shi, PA-C    potassium chloride SA (K-DUR,KLOR-CON) 20 MEQ tablet Take 1 tablet (20 mEq total) by mouth 2 (two) times daily. 12/24/15  Yes Midge Minium, MD  predniSONE (DELTASONE) 5 MG tablet Take 5 mg by mouth every Monday, Wednesday, and Friday.   Yes Historical Provider, MD  tacrolimus (PROGRAF) 1 MG capsule Take 5 mg by mouth 2 (two) times daily.    Yes Historical Provider, MD    ROS:  Out of a complete 14 system review of symptoms, the patient complains only of the following symptoms, and all other reviewed systems are negative.  Decreased appetite Blurred vision Frequent waking Walking difficulty Memory loss, dizziness, speech difficulty, weakness Confusion, anxiety  Blood pressure (!) 153/101, pulse (!) 106, height 5\' 5"  (1.651 m), weight 144  lb 8 oz (65.5 kg).  Physical Exam  General: The patient is alert and cooperative at the time of the examination.  Skin: No significant peripheral edema is noted.   Neurologic Exam  Mental status: The patient is alert and oriented x 3 at the time of the examination. The patient has apparent normal recent and remote memory, with an apparently normal attention span and concentration ability.   Cranial nerves: Facial symmetry is present. Speech is normal, no aphasia or dysarthria is noted. Extraocular movements are full, with exception that the patient has restricted superior gaze. Visual fields are full. Pupils are equal, round, and reactive to light. Discs are flat bilaterally.  Motor: The patient has good strength in all 4 extremities.  Sensory examination: Soft touch sensation is symmetric on the face, arms, and legs.  Coordination: The patient has good finger-nose-finger and heel-to-shin bilaterally.  Gait and station: The patient has a slightly unsteady gait. Tandem gait is slightly unsteady, apraxic. Romberg is negative. No drift is seen.  Reflexes: Deep tendon reflexes are symmetric.    MRI brain 06/11/16:  IMPRESSION: 1. Stable  diffuse periventricular and subcortical white matter disease bilaterally in a pattern typical of multiple sclerosis. Finding is, however, nonspecific. 2. No acute intracranial abnormality or significant interval change.  * MRI scan images were reviewed online. I agree with the written report.    Assessment/Plan:  1. Abnormal MRI brain  The patient does have brain abnormalities that clearly have progressed from 2007. The patient does have oligoclonal banding in the spinal fluid which is suggestive of but not diagnostic of multiple sclerosis. The patient will undergo cervical spine MRI evaluation and a visual evoked response test. Further blood work will be done today. The patient will follow-up for her next scheduled revisit in May. The pending upon the results of the above studies, we may consider initiating treatment for multiple sclerosis.  Jill Alexanders MD 07/03/2016 9:34 AM  [facility] [address] [place], [place] [address]  Phone [phone] Fax [phone]

## 2016-07-04 ENCOUNTER — Ambulatory Visit (INDEPENDENT_AMBULATORY_CARE_PROVIDER_SITE_OTHER): Payer: Medicare Other | Admitting: Physician Assistant

## 2016-07-04 ENCOUNTER — Encounter: Payer: Self-pay | Admitting: Physician Assistant

## 2016-07-04 VITALS — BP 124/80 | HR 95 | Temp 99.1°F | Resp 14 | Ht 65.0 in | Wt 141.0 lb

## 2016-07-04 DIAGNOSIS — N189 Chronic kidney disease, unspecified: Secondary | ICD-10-CM

## 2016-07-04 DIAGNOSIS — E875 Hyperkalemia: Secondary | ICD-10-CM

## 2016-07-04 LAB — BASIC METABOLIC PANEL
BUN: 30 mg/dL — AB (ref 6–23)
CALCIUM: 9.2 mg/dL (ref 8.4–10.5)
CO2: 25 mEq/L (ref 19–32)
CREATININE: 2.84 mg/dL — AB (ref 0.40–1.20)
Chloride: 100 mEq/L (ref 96–112)
GFR: 21.93 mL/min — ABNORMAL LOW (ref >60.00)
GLUCOSE: 163 mg/dL — AB (ref 70–99)
Potassium: 4.8 mEq/L (ref 3.5–5.1)
Sodium: 134 mEq/L — ABNORMAL LOW (ref 135–145)

## 2016-07-04 NOTE — Patient Instructions (Signed)
Please stay hydrated. Follow-up with Neurology as scheduled for further testing for MS. Continue current medications as directed. I am rechecking your kidney function and potassium levels before we would consider restarting losartan versus another medication.  Continue physical therapy at home.  We will work on strengthening and helping with balance. Wear the compression stockings during the day. Remember to rise slowly from seated position.

## 2016-07-04 NOTE — Progress Notes (Signed)
Pre visit review using our clinic review tool, if applicable. No additional management support is needed unless otherwise documented below in the visit note. 

## 2016-07-06 NOTE — Progress Notes (Signed)
Patient presents to clinic today for 3 day follow-up of hyperkalemia and AKI on CKD. Patient with potassium noted at 6.0 at Creatinine at 3.01 (baseline 2.5) on last check. Losartan was held. Patient instructed to continue other medications as directed. Patient endorses doing as directed. Patient denies chest pain, palpitations, lightheadedness, dizziness, vision changes or frequent headaches. Home BP readings averaging 120s-150s/80s.   Past Medical History:  Diagnosis Date  . Adenomatous colon polyp 04/1998  . Anemia   . Carotid artery disease ([place])    Carotid US 1/18: bilateral ICA 1-39, R thyroid lobe nodule (1.9x2.2x3cm); numerous L thyroid lobe nodules - repeat 1 year  . Chronic kidney disease   . Chronic renal failure    post transplant  . Diabetes mellitus   . Esophagitis    Grade 1 Distal  . GERD (gastroesophageal reflux disease)   . Hemorrhoids   . Hx of cardiovascular stress test    Lexiscan Myoview (06/2013):  No ischemia, EF 66%; normal.  //  Myoview 12/17: EF 62, no ischemia or scar; Normal  . Hx of echocardiogram    a. Echocardiogram (06/2013):  Mod focal basal hypertrophy, EF 60-65%, normal wall motion, Gr 1 DD, mild AI, mildly dilated ascending aorta (41 mm), mild LAE.; b.  Echo 9/16: mod LVH, EF 60-65%, no RWMA, Gr 1 DD, trivial AI, mild dilated ascending aorta, mild LAE  . Hyperkalemia   . Hyperlipidemia   . Hypertension   . Metabolic acidosis   . Pneumonia   . Seizures ([place])     Current Outpatient Prescriptions on File Prior to Visit  Medication Sig Dispense Refill  . acetaminophen (TYLENOL) 500 MG tablet Take 1,000 mg by mouth every 6 (six) hours as needed for moderate pain.    [name] amLODipine (NORVASC) 10 MG tablet Take 10 mg by mouth daily.    . cholecalciferol (VITAMIN D) 1000 units tablet Take 1,000 Units by mouth every other day.    . clonazePAM (KLONOPIN) 1 MG tablet Take 1 tablet (1 mg total) by mouth 2 (two) times daily. 60 tablet 1  . cyclobenzaprine  (FLEXERIL) 10 MG tablet TAKE 1 TABLET (10 MG TOTAL) BY MOUTH 3 (THREE) TIMES DAILY AS NEEDED FOR MUSCLE SPASMS. 30 tablet 0  . escitalopram (LEXAPRO) 10 MG tablet Take 1 tablet (10 mg total) by mouth daily. 30 tablet 3  . fenofibrate 160 MG tablet Take 160 mg by mouth daily.    . furosemide (LASIX) 40 MG tablet 1 tab twice a day 60 tablet 1  . hydrALAZINE (APRESOLINE) 50 MG tablet Take 1 tablet (50 mg total) by mouth 3 (three) times daily. 90 tablet 1  . isosorbide mononitrate (IMDUR) 60 MG 24 hr tablet Take 1 tablet (60 mg total) by mouth daily. 30 tablet 1  . Magnesium 250 MG TABS Take 500 mg by mouth daily.    . meclizine (ANTIVERT) 25 MG tablet TAKE 1 TABLET (25 MG TOTAL) BY MOUTH 3 (THREE) TIMES DAILY AS NEEDED FOR DIZZINESS. 45 tablet 0  . Multiple Vitamin (MULTIVITAMIN) tablet Take 1 tablet by mouth daily.      . mycophenolate (CELLCEPT) 250 MG capsule Take 750 mg by mouth 2 (two) times daily.     . nitroGLYCERIN (NITROSTAT) 0.4 MG SL tablet Place 1 tablet (0.4 mg total) under the tongue every 5 (five) minutes as needed for chest pain. 25 tablet 3  . potassium chloride SA (K-DUR,KLOR-CON) 20 MEQ tablet Take 1 tablet (20 mEq total) by mouth  2 (two) times daily. 180 tablet 1  . predniSONE (DELTASONE) 5 MG tablet Take 5 mg by mouth every Monday, Wednesday, and Friday.    . tacrolimus (PROGRAF) 1 MG capsule Take 5 mg by mouth 2 (two) times daily.     [name] losartan (COZAAR) 100 MG tablet Take 1 tablet (100 mg total) by mouth daily. (Patient not taking: Reported on 07/04/2016) 30 tablet 1   No current facility-administered medications on file prior to visit.     Allergies  Allergen Reactions  . Lisinopril Shortness Of Breath, Swelling and Other (See Comments)    Throat irritation also. Patient takes losartan and tolerates fine.  [name] [Oxaprozin] Dermatitis and Other (See Comments)    hives    Family History  Problem Relation Age of Onset  . Kidney disease Paternal Aunt   . Heart  disease Mother   . Heart disease Father   . Colon cancer Neg Hx     Social History   Social History  . Marital status: Widowed    Spouse name: N/A  . Number of children: 2  . Years of education: 12   Occupational History  . [employer]     Social History Main Topics  . Smoking status: Never Smoker  . Smokeless tobacco: Never Used  . Alcohol use No  . Drug use: No  . Sexual activity: No   Other Topics Concern  . None   Social History Narrative   Denies caffeine use     Review of Systems - See HPI.  All other ROS are negative.  BP 124/80   Pulse 95   Temp 99.1 F (37.3 C) (Oral)   Resp 14   Ht '5\' 5"'  (1.651 m)   Wt 141 lb (64 kg)   SpO2 98%   BMI 23.46 kg/m   Physical Exam  Constitutional: She is oriented to person, place, and time and well-developed, well-nourished, and in no distress.  HENT:  Head: Normocephalic and atraumatic.  Eyes: Conjunctivae are normal.  Cardiovascular: Normal rate, regular rhythm, normal heart sounds and intact distal pulses.   Pulmonary/Chest: Effort normal and breath sounds normal. No respiratory distress. She has no wheezes. She has no rales. She exhibits no tenderness.  Neurological: She is alert and oriented to person, place, and time.  Skin: Skin is warm and dry. No rash noted.  Psychiatric: Affect normal.  Vitals reviewed.  Recent Results (from the past 2160 hour(s))  Hemoglobin A1c     Status: None   Collection Time: 05/19/16 11:15 AM  Result Value Ref Range   Hgb A1c MFr Bld 6.5 4.6 - 6.5 %    Comment: Glycemic Control Guidelines for People with Diabetes:Non Diabetic:  <6%Goal of Therapy: <7%Additional Action Suggested:  >9%   Basic metabolic panel     Status: Abnormal   Collection Time: 05/19/16 11:15 AM  Result Value Ref Range   Sodium 135 135 - 145 mEq/L   Potassium 3.8 3.5 - 5.1 mEq/L   Chloride 97 96 - 112 mEq/L   CO2 28 19 - 32 mEq/L   Glucose, Bld 69 (L) 70 - 99 mg/dL   BUN 29 (H) 6 - 23 mg/dL   Creatinine,  Ser 2.63 (H) 0.40 - 1.20 mg/dL   Calcium 9.4 8.4 - 10.5 mg/dL   GFR 23.98 (L) >60.00 mL/min  RPR     Status: None   Collection Time: 05/26/16  1:59 PM  Result Value Ref Range   RPR Ser Ql  Non Reactive Non Reactive  Sedimentation rate     Status: None   Collection Time: 05/26/16  1:59 PM  Result Value Ref Range   Sed Rate 34 0 - 40 mm/hr  HIV antibody     Status: None   Collection Time: 05/26/16  1:59 PM  Result Value Ref Range   HIV Screen 4th Generation wRfx Non Reactive Non Reactive  Angiotensin converting enzyme     Status: None   Collection Time: 05/26/16  1:59 PM  Result Value Ref Range   Angio Convert Enzyme 32 14 - 82 U/L  ANA w/Reflex     Status: None   Collection Time: 05/26/16  1:59 PM  Result Value Ref Range   Anit Nuclear Antibody(ANA) Negative Negative  B. burgdorfi antibodies     Status: None   Collection Time: 05/26/16  1:59 PM  Result Value Ref Range   Lyme IgG/IgM Ab <0.91 0.00 - 0.90 ISR    Comment:                                 Negative         <0.91                                 Equivocal  0.91 - 1.09                                 Positive         >1.09   Pan-ANCA     Status: None   Collection Time: 05/26/16  2:02 PM  Result Value Ref Range   Myeloperoxidase Ab <9.0 0.0 - 9.0 U/mL   ANCA Proteinase 3 <3.5 0.0 - 3.5 U/mL   C-ANCA <1:20 Neg:<1:20 titer   P-ANCA <1:20 Neg:<1:20 titer    Comment: The presence of positive fluorescence exhibiting P-ANCA or C-ANCA patterns alone is not specific for the diagnosis of Wegener's Granulomatosis (WG) or microscopic polyangiitis. Decisions about treatment should not be based solely on ANCA IFA results.  The International ANCA Group Consensus recommends follow up testing of positive sera with both PR-3 and MPO-ANCA enzyme immunoassays. As many as 5% serum samples are positive only by EIA. Ref. AM J Clin Pathol 1999;111:507-513.    Atypical pANCA <1:20 Neg:<1:20 titer    Comment: The atypical pANCA pattern  has been observed in a significant percentage of patients with ulcerative colitis, primary sclerosing cholangitis and autoimmune hepatitis.   CSF cell count with differential     Status: Abnormal   Collection Time: 06/02/16  8:56 AM  Result Value Ref Range   Color, CSF COLORLESS COLORLESS   Appearance, CSF CLEAR CLEAR   RBC Count, CSF 36 (H) 0 - 10 cells/uL   WBC, CSF 1 0 - 5 cells/uL   Comment: SEE NOTE     Comment: Too few to count, smear available for review. Performed from tube 4.   Protein, CSF     Status: Abnormal   Collection Time: 06/02/16  8:56 AM  Result Value Ref Range   Total Protein, CSF 72 (H) 15 - 45 mg/dL  Glucose, CSF     Status: None   Collection Time: 06/02/16  8:56 AM  Result Value Ref Range   Glucose, CSF 53 43 - 76 mg/dL  VDRL,  CSF     Status: None   Collection Time: 06/02/16  8:56 AM  Result Value Ref Range   VDRL Quant, CSF Nonreactive Nonreactive  Oligoclonal bands, CSF + serm     Status: None   Collection Time: 06/02/16  8:56 AM  Result Value Ref Range   CSF Oligoclonal Bands REPORT     Comment: Bands noted            Reference Range: No bands The patients CSF contains >5 well defined gamma restriction bands that are not present in the patient's corresponding serum sample.  These bands indicate abnormal synthesis of gammaglobulins in the central nervous system.  This finding is supportive evidence of multiple sclerosis, but should be interpreted in conjunction with all clinical and laboratory data pertaining to this patient. Oligoclonal bands are present in the CSF of more than 85% of patients with clinically definite multiple sclerosis (MS). To distinguish between oligoclonal bands in the CSF due to a peripheral gammopathy and oligoclonal bands due to local production in the CNS, serum and CSF should be tested simultaneously. Oligoclonal bands can however be observed in a variety of other diseases, e.g., subacute sclerosing panen- cephalitis,  inflammatory polyneuropathy, CNS lupus, and brain tumors and infarctions. The clinical significance of a numerical b and count, determined by isoelectric focusing, has not been definitively defined. The data should be interpreted in conjunction with all pertinent clinical and laboratory data for this patient.   Angiotensin converting enzyme, CSF     Status: None   Collection Time: 06/02/16  8:56 AM  Result Value Ref Range   ACE, CSF 9 <=15 U/L  Basic metabolic panel     Status: Abnormal   Collection Time: 06/11/16  2:36 PM  Result Value Ref Range   Sodium 138 135 - 145 mmol/L   Potassium 3.3 (L) 3.5 - 5.1 mmol/L   Chloride 99 (L) 101 - 111 mmol/L   CO2 26 22 - 32 mmol/L   Glucose, Bld 134 (H) 65 - 99 mg/dL   BUN 32 (H) 6 - 20 mg/dL   Creatinine, Ser 2.13 (H) 0.44 - 1.00 mg/dL   Calcium 10.3 8.9 - 10.3 mg/dL   GFR calc non Af Amer 24 (L) >60 mL/min   GFR calc Af Amer 28 (L) >60 mL/min    Comment: (NOTE) The eGFR has been calculated using the CKD EPI equation. This calculation has not been validated in all clinical situations. eGFR's persistently <60 mL/min signify possible Chronic Kidney Disease.    Anion gap 13 5 - 15  CBC with Differential     Status: None   Collection Time: 06/11/16  2:36 PM  Result Value Ref Range   WBC 8.3 4.0 - 10.5 K/uL   RBC 4.29 3.87 - 5.11 MIL/uL   Hemoglobin 12.1 12.0 - 15.0 g/dL   HCT 36.7 36.0 - 46.0 %   MCV 85.5 78.0 - 100.0 fL   MCH 28.2 26.0 - 34.0 pg   MCHC 33.0 30.0 - 36.0 g/dL   RDW 13.9 11.5 - 15.5 %   Platelets 269 150 - 400 K/uL   Neutrophils Relative % 66 %   Neutro Abs 5.5 1.7 - 7.7 K/uL   Lymphocytes Relative 24 %   Lymphs Abs 2.0 0.7 - 4.0 K/uL   Monocytes Relative 9 %   Monocytes Absolute 0.7 0.1 - 1.0 K/uL   Eosinophils Relative 1 %   Eosinophils Absolute 0.1 0.0 - 0.7 K/uL   Basophils Relative 0 %  Basophils Absolute 0.0 0.0 - 0.1 K/uL  Urinalysis, Routine w reflex microscopic     Status: Abnormal   Collection Time:  06/11/16  4:18 PM  Result Value Ref Range   Color, Urine STRAW (A) YELLOW   APPearance CLEAR CLEAR   Specific Gravity, Urine 1.006 1.005 - 1.030   pH 6.0 5.0 - 8.0   Glucose, UA NEGATIVE NEGATIVE mg/dL   Hgb urine dipstick NEGATIVE NEGATIVE   Bilirubin Urine NEGATIVE NEGATIVE   Ketones, ur NEGATIVE NEGATIVE mg/dL   Protein, ur 100 (A) NEGATIVE mg/dL   Nitrite NEGATIVE NEGATIVE   Leukocytes, UA NEGATIVE NEGATIVE   RBC / HPF 0-5 0 - 5 RBC/hpf   WBC, UA 0-5 0 - 5 WBC/hpf   Bacteria, UA NONE SEEN NONE SEEN   Squamous Epithelial / LPF 0-5 (A) NONE SEEN  CBC with Differential     Status: Abnormal   Collection Time: 06/18/16  5:49 AM  Result Value Ref Range   WBC 7.0 4.0 - 10.5 K/uL   RBC 3.73 (L) 3.87 - 5.11 MIL/uL   Hemoglobin 10.3 (L) 12.0 - 15.0 g/dL   HCT 31.7 (L) 36.0 - 46.0 %   MCV 85.0 78.0 - 100.0 fL   MCH 27.6 26.0 - 34.0 pg   MCHC 32.5 30.0 - 36.0 g/dL   RDW 14.1 11.5 - 15.5 %   Platelets 216 150 - 400 K/uL   Neutrophils Relative % 68 %   Neutro Abs 4.7 1.7 - 7.7 K/uL   Lymphocytes Relative 22 %   Lymphs Abs 1.6 0.7 - 4.0 K/uL   Monocytes Relative 9 %   Monocytes Absolute 0.6 0.1 - 1.0 K/uL   Eosinophils Relative 1 %   Eosinophils Absolute 0.1 0.0 - 0.7 K/uL   Basophils Relative 0 %   Basophils Absolute 0.0 0.0 - 0.1 K/uL  Comprehensive metabolic panel     Status: Abnormal   Collection Time: 06/18/16  5:49 AM  Result Value Ref Range   Sodium 145 135 - 145 mmol/L   Potassium 3.1 (L) 3.5 - 5.1 mmol/L   Chloride 112 (H) 101 - 111 mmol/L   CO2 23 22 - 32 mmol/L   Glucose, Bld 128 (H) 65 - 99 mg/dL   BUN 28 (H) 6 - 20 mg/dL   Creatinine, Ser 2.50 (H) 0.44 - 1.00 mg/dL   Calcium 9.1 8.9 - 10.3 mg/dL   Total Protein 7.1 6.5 - 8.1 g/dL   Albumin 3.8 3.5 - 5.0 g/dL   AST 17 15 - 41 U/L   ALT 19 14 - 54 U/L   Alkaline Phosphatase 56 38 - 126 U/L   Total Bilirubin 0.6 0.3 - 1.2 mg/dL   GFR calc non Af Amer 20 (L) >60 mL/min   GFR calc Af Amer 23 (L) >60 mL/min     Comment: (NOTE) The eGFR has been calculated using the CKD EPI equation. This calculation has not been validated in all clinical situations. eGFR's persistently <60 mL/min signify possible Chronic Kidney Disease.    Anion gap 10 5 - 15  Urinalysis, Routine w reflex microscopic     Status: Abnormal   Collection Time: 06/18/16  5:49 AM  Result Value Ref Range   Color, Urine YELLOW YELLOW   APPearance CLEAR CLEAR   Specific Gravity, Urine 1.010 1.005 - 1.030   pH 6.0 5.0 - 8.0   Glucose, UA NEGATIVE NEGATIVE mg/dL   Hgb urine dipstick NEGATIVE NEGATIVE   Bilirubin Urine NEGATIVE NEGATIVE  Ketones, ur NEGATIVE NEGATIVE mg/dL   Protein, ur >=300 (A) NEGATIVE mg/dL   Nitrite NEGATIVE NEGATIVE   Leukocytes, UA NEGATIVE NEGATIVE   RBC / HPF 0-5 0 - 5 RBC/hpf   WBC, UA 0-5 0 - 5 WBC/hpf   Bacteria, UA RARE (A) NONE SEEN   Squamous Epithelial / LPF 0-5 (A) NONE SEEN  Ammonia     Status: None   Collection Time: 06/18/16  5:49 AM  Result Value Ref Range   Ammonia 17 9 - 35 umol/L  I-Stat CG4 Lactic Acid, ED     Status: None   Collection Time: 06/18/16  6:07 AM  Result Value Ref Range   Lactic Acid, Venous 0.73 0.5 - 1.9 mmol/L  CBG monitoring, ED     Status: Abnormal   Collection Time: 06/18/16  6:31 AM  Result Value Ref Range   Glucose-Capillary 125 (H) 65 - 99 mg/dL  CBC     Status: Abnormal   Collection Time: 06/18/16  9:42 AM  Result Value Ref Range   WBC 7.3 4.0 - 10.5 K/uL   RBC 3.65 (L) 3.87 - 5.11 MIL/uL   Hemoglobin 10.3 (L) 12.0 - 15.0 g/dL   HCT 31.7 (L) 36.0 - 46.0 %   MCV 86.8 78.0 - 100.0 fL   MCH 28.2 26.0 - 34.0 pg   MCHC 32.5 30.0 - 36.0 g/dL   RDW 14.3 11.5 - 15.5 %   Platelets 225 150 - 400 K/uL  Creatinine, serum     Status: Abnormal   Collection Time: 06/18/16  9:42 AM  Result Value Ref Range   Creatinine, Ser 2.50 (H) 0.44 - 1.00 mg/dL   GFR calc non Af Amer 20 (L) >60 mL/min   GFR calc Af Amer 23 (L) >60 mL/min    Comment: (NOTE) The eGFR has been  calculated using the CKD EPI equation. This calculation has not been validated in all clinical situations. eGFR's persistently <60 mL/min signify possible Chronic Kidney Disease.   Hemoglobin A1c     Status: Abnormal   Collection Time: 06/18/16  9:42 AM  Result Value Ref Range   Hgb A1c MFr Bld 6.2 (H) 4.8 - 5.6 %    Comment: (NOTE)         Pre-diabetes: 5.7 - 6.4         Diabetes: >6.4         Glycemic control for adults with diabetes: <7.0    Mean Plasma Glucose 131 mg/dL    Comment: (NOTE) Performed At: [facility] [place], [place] [id number] Lindon Romp MD [id number]   Tacrolimus level     Status: Abnormal   Collection Time: 06/18/16  9:42 AM  Result Value Ref Range   Tacrolimus (FK506) - [facility] 1.0 (L) 2.0 - 20.0 ng/mL    Comment: (NOTE)        Trough (immediately following                transplant)                       15.0        Trough (steady state, 2 weeks or                more after transplant):      3.0 - 8.0  Detection Limit = 1.0        Performed by LC-MS/MS technology. Performed At: [facility] [place], [place] [id number] Lindon Romp MD OX:[id number]   Mycophenolic Acid (CellCept)     Status: Abnormal   Collection Time: 06/18/16  9:42 AM  Result Value Ref Range   MPA 7.0 (H) 1.0 - 3.5 ug/mL   MPA Glucuronide 97 15 - 125 ug/mL    Comment: (NOTE) Performed At: [facility] [place], [place] [id number] Lindon Romp MD [id number]   Glucose, capillary     Status: Abnormal   Collection Time: 06/18/16 11:17 AM  Result Value Ref Range   Glucose-Capillary 121 (H) 65 - 99 mg/dL  Glucose, capillary     Status: Abnormal   Collection Time: 06/18/16  5:10 PM  Result Value Ref Range   Glucose-Capillary 166 (H) 65 - 99 mg/dL  Glucose, capillary     Status: Abnormal   Collection Time: 06/18/16  9:24 PM  Result Value Ref Range    Glucose-Capillary 121 (H) 65 - 99 mg/dL  Basic metabolic panel     Status: Abnormal   Collection Time: 06/19/16  5:31 AM  Result Value Ref Range   Sodium 146 (H) 135 - 145 mmol/L   Potassium 3.4 (L) 3.5 - 5.1 mmol/L   Chloride 115 (H) 101 - 111 mmol/L   CO2 22 22 - 32 mmol/L   Glucose, Bld 122 (H) 65 - 99 mg/dL   BUN 29 (H) 6 - 20 mg/dL   Creatinine, Ser 2.27 (H) 0.44 - 1.00 mg/dL   Calcium 8.8 (L) 8.9 - 10.3 mg/dL   GFR calc non Af Amer 23 (L) >60 mL/min   GFR calc Af Amer 26 (L) >60 mL/min    Comment: (NOTE) The eGFR has been calculated using the CKD EPI equation. This calculation has not been validated in all clinical situations. eGFR's persistently <60 mL/min signify possible Chronic Kidney Disease.    Anion gap 9 5 - 15  Glucose, capillary     Status: Abnormal   Collection Time: 06/19/16  7:27 AM  Result Value Ref Range   Glucose-Capillary 125 (H) 65 - 99 mg/dL  Glucose, capillary     Status: Abnormal   Collection Time: 06/19/16 11:32 AM  Result Value Ref Range   Glucose-Capillary 134 (H) 65 - 99 mg/dL  Glucose, capillary     Status: Abnormal   Collection Time: 06/19/16  4:47 PM  Result Value Ref Range   Glucose-Capillary 121 (H) 65 - 99 mg/dL  Glucose, capillary     Status: Abnormal   Collection Time: 06/20/16  7:04 AM  Result Value Ref Range   Glucose-Capillary 133 (H) 65 - 99 mg/dL   Comment 1 Notify RN    Comment 2 Document in Chart   Basic metabolic panel     Status: Abnormal   Collection Time: 06/20/16  9:48 AM  Result Value Ref Range   Sodium 144 135 - 145 mmol/L   Potassium 3.6 3.5 - 5.1 mmol/L   Chloride 117 (H) 101 - 111 mmol/L   CO2 20 (L) 22 - 32 mmol/L   Glucose, Bld 127 (H) 65 - 99 mg/dL   BUN 24 (H) 6 - 20 mg/dL   Creatinine, Ser 2.09 (H) 0.44 - 1.00 mg/dL   Calcium 9.0 8.9 - 10.3 mg/dL   GFR calc non Af Amer 25 (L) >60 mL/min   GFR calc Af Amer 29 (L) >60  mL/min    Comment: (NOTE) The eGFR has been calculated using the CKD EPI equation. This  calculation has not been validated in all clinical situations. eGFR's persistently <60 mL/min signify possible Chronic Kidney Disease.    Anion gap 7 5 - 15  Glucose, capillary     Status: Abnormal   Collection Time: 06/20/16 12:14 PM  Result Value Ref Range   Glucose-Capillary 155 (H) 65 - 99 mg/dL  Glucose, capillary     Status: Abnormal   Collection Time: 06/20/16  4:17 PM  Result Value Ref Range   Glucose-Capillary 152 (H) 65 - 99 mg/dL  Glucose, capillary     Status: Abnormal   Collection Time: 06/20/16  8:23 PM  Result Value Ref Range   Glucose-Capillary 141 (H) 65 - 99 mg/dL  Glucose, capillary     Status: Abnormal   Collection Time: 06/21/16  7:28 AM  Result Value Ref Range   Glucose-Capillary 117 (H) 65 - 99 mg/dL  Comprehensive metabolic panel     Status: Abnormal   Collection Time: 06/21/16  9:03 AM  Result Value Ref Range   Sodium 141 135 - 145 mmol/L   Potassium 4.1 3.5 - 5.1 mmol/L   Chloride 113 (H) 101 - 111 mmol/L   CO2 23 22 - 32 mmol/L   Glucose, Bld 174 (H) 65 - 99 mg/dL   BUN 24 (H) 6 - 20 mg/dL   Creatinine, Ser 2.17 (H) 0.44 - 1.00 mg/dL   Calcium 9.3 8.9 - 10.3 mg/dL   Total Protein 6.5 6.5 - 8.1 g/dL   Albumin 3.5 3.5 - 5.0 g/dL   AST 17 15 - 41 U/L   ALT 18 14 - 54 U/L   Alkaline Phosphatase 51 38 - 126 U/L   Total Bilirubin 0.5 0.3 - 1.2 mg/dL   GFR calc non Af Amer 24 (L) >60 mL/min   GFR calc Af Amer 28 (L) >60 mL/min    Comment: (NOTE) The eGFR has been calculated using the CKD EPI equation. This calculation has not been validated in all clinical situations. eGFR's persistently <60 mL/min signify possible Chronic Kidney Disease.    Anion gap 5 5 - 15  Glucose, capillary     Status: Abnormal   Collection Time: 06/21/16 11:54 AM  Result Value Ref Range   Glucose-Capillary 123 (H) 65 - 99 mg/dL  Comprehensive metabolic panel     Status: Abnormal   Collection Time: 06/21/16  3:37 PM  Result Value Ref Range   Sodium 140 135 - 145 mmol/L     Potassium 4.2 3.5 - 5.1 mmol/L   Chloride 114 (H) 101 - 111 mmol/L   CO2 21 (L) 22 - 32 mmol/L   Glucose, Bld 109 (H) 65 - 99 mg/dL   BUN 25 (H) 6 - 20 mg/dL   Creatinine, Ser 2.08 (H) 0.44 - 1.00 mg/dL   Calcium 8.8 (L) 8.9 - 10.3 mg/dL   Total Protein 6.2 (L) 6.5 - 8.1 g/dL   Albumin 3.3 (L) 3.5 - 5.0 g/dL   AST 19 15 - 41 U/L   ALT 19 14 - 54 U/L   Alkaline Phosphatase 46 38 - 126 U/L   Total Bilirubin 0.5 0.3 - 1.2 mg/dL   GFR calc non Af Amer 25 (L) >60 mL/min   GFR calc Af Amer 29 (L) >60 mL/min    Comment: (NOTE) The eGFR has been calculated using the CKD EPI equation. This calculation has not been validated in all  clinical situations. eGFR's persistently <60 mL/min signify possible Chronic Kidney Disease.    Anion gap 5 5 - 15  Mycophenolic Acid (CellCept)     Status: None   Collection Time: 06/21/16  3:37 PM  Result Value Ref Range   MPA 2.4 1.0 - 3.5 ug/mL   MPA Glucuronide 53 15 - 125 ug/mL    Comment: (NOTE) Performed At: [facility] [address] [place], [place] [id number] Lindon Romp MD [id number]   Glucose, capillary     Status: Abnormal   Collection Time: 06/21/16  4:49 PM  Result Value Ref Range   Glucose-Capillary 116 (H) 65 - 99 mg/dL  Glucose, capillary     Status: Abnormal   Collection Time: 06/21/16  9:35 PM  Result Value Ref Range   Glucose-Capillary 118 (H) 65 - 99 mg/dL  CBC     Status: Abnormal   Collection Time: 06/22/16  5:52 AM  Result Value Ref Range   WBC 6.3 4.0 - 10.5 K/uL   RBC 3.30 (L) 3.87 - 5.11 MIL/uL   Hemoglobin 9.6 (L) 12.0 - 15.0 g/dL   HCT 28.9 (L) 36.0 - 46.0 %   MCV 87.6 78.0 - 100.0 fL   MCH 29.1 26.0 - 34.0 pg   MCHC 33.2 30.0 - 36.0 g/dL   RDW 14.2 11.5 - 15.5 %   Platelets 221 150 - 400 K/uL  Glucose, capillary     Status: None   Collection Time: 06/22/16  7:40 AM  Result Value Ref Range   Glucose-Capillary 98 65 - 99 mg/dL  Glucose, capillary     Status: Abnormal   Collection Time:  06/22/16 11:34 AM  Result Value Ref Range   Glucose-Capillary 100 (H) 65 - 99 mg/dL  Glucose, capillary     Status: Abnormal   Collection Time: 06/22/16  5:30 PM  Result Value Ref Range   Glucose-Capillary 101 (H) 65 - 99 mg/dL  Glucose, capillary     Status: Abnormal   Collection Time: 06/22/16  9:57 PM  Result Value Ref Range   Glucose-Capillary 132 (H) 65 - 99 mg/dL  Miscellaneous [facility] test (send-out)     Status: None   Collection Time: 06/23/16  5:00 AM  Result Value Ref Range   [facility] test code MAYO SERUM ENS1    [facility] test name ENCEPHALOPATHY PANEL    Misc [facility] result COMMENT     Comment: (NOTE) Performed At: [facility] [place], [place] [id number] Lindon Romp MD [id number]   Basic metabolic panel     Status: Abnormal   Collection Time: 06/23/16  5:00 AM  Result Value Ref Range   Sodium 136 135 - 145 mmol/L   Potassium 4.8 3.5 - 5.1 mmol/L   Chloride 111 101 - 111 mmol/L   CO2 19 (L) 22 - 32 mmol/L   Glucose, Bld 109 (H) 65 - 99 mg/dL   BUN 27 (H) 6 - 20 mg/dL   Creatinine, Ser 2.41 (H) 0.44 - 1.00 mg/dL   Calcium 9.0 8.9 - 10.3 mg/dL   GFR calc non Af Amer 21 (L) >60 mL/min   GFR calc Af Amer 24 (L) >60 mL/min    Comment: (NOTE) The eGFR has been calculated using the CKD EPI equation. This calculation has not been validated in all clinical situations. eGFR's persistently <60 mL/min signify possible Chronic Kidney Disease.    Anion gap 6 5 - 15  Glucose, capillary     Status: None  Collection Time: 06/23/16  7:40 AM  Result Value Ref Range   Glucose-Capillary 98 65 - 99 mg/dL  Glucose, capillary     Status: Abnormal   Collection Time: 06/23/16 12:47 PM  Result Value Ref Range   Glucose-Capillary 103 (H) 65 - 99 mg/dL  Glucose, capillary     Status: Abnormal   Collection Time: 06/23/16  4:28 PM  Result Value Ref Range   Glucose-Capillary 128 (H) 65 - 99 mg/dL  CBC     Status: Abnormal   Collection Time: 06/30/16   2:30 PM  Result Value Ref Range   WBC 8.6 4.0 - 10.5 K/uL   RBC 3.68 (L) 3.87 - 5.11 Mil/uL   Platelets 373.0 150.0 - 400.0 K/uL   Hemoglobin 10.6 (L) 12.0 - 15.0 g/dL   HCT 32.1 (L) 36.0 - 46.0 %   MCV 87.4 78.0 - 100.0 fl   MCHC 33.0 30.0 - 36.0 g/dL   RDW 16.3 (H) 11.5 - 15.5 %  Comp Met (CMET)     Status: Abnormal   Collection Time: 06/30/16  2:30 PM  Result Value Ref Range   Sodium 139 135 - 145 mEq/L   Potassium 6.0 (H) 3.5 - 5.1 mEq/L   Chloride 106 96 - 112 mEq/L   CO2 26 19 - 32 mEq/L   Glucose, Bld 162 (H) 70 - 99 mg/dL   BUN 34 (H) 6 - 23 mg/dL   Creatinine, Ser 3.01 (H) 0.40 - 1.20 mg/dL   Total Bilirubin 0.4 0.2 - 1.2 mg/dL   Alkaline Phosphatase 43 39 - 117 U/L   AST 15 0 - 37 U/L   ALT 16 0 - 35 U/L   Total Protein 6.5 6.0 - 8.3 g/dL   Albumin 3.8 3.5 - 5.2 g/dL   Calcium 9.7 8.4 - 10.5 mg/dL   GFR 20.51 (L) >60.00 mL/min  Gliadin antibodies, serum     Status: None   Collection Time: 07/03/16 10:01 AM  Result Value Ref Range   Antigliadin Abs, IgA 3 0 - 19 units    Comment:                    Negative                   0 - 19                    Weak Positive             20 - 30                    Moderate to Strong Positive   >30    Gliadin IgG 2 0 - 19 units    Comment:                    Negative                   0 - 19                    Weak Positive             20 - 30                    Moderate to Strong Positive   >30   Hepatitis B surface antigen     Status: None   Collection Time: 07/03/16 10:01 AM  Result Value Ref  Range   Hepatitis B Surface Ag Negative Negative  Hepatitis C antibody     Status: None   Collection Time: 07/03/16 10:01 AM  Result Value Ref Range   Hep C Virus Ab <0.1 0.0 - 0.9 s/co ratio    Comment:                                   Negative:     < 0.8                              Indeterminate: 0.8 - 0.9                                   Positive:     > 0.9  The CDC recommends that a positive HCV antibody result  be  followed up with a HCV Nucleic Acid Amplification  test (681275).   Paraneoplastic Profile 1     Status: None (Preliminary result)   Collection Time: 07/03/16 10:01 AM  Result Value Ref Range   Neuronal Nuclear (Hu) Antibody (IB) WILL FOLLOW    Purkinje Cell (Yo) Autoantobodies- IFA WILL FOLLOW    Neuronal Nuc Ab (Ri), IFA WILL FOLLOW   Lupus anticoagulant     Status: None   Collection Time: 07/03/16 10:01 AM  Result Value Ref Range   dPT 41.2 0.0 - 55.0 sec   dPT Confirm Ratio 1.18 0.00 - 1.40 Ratio   Thrombin Time 22.2 0.0 - 23.0 sec   PTT Lupus Anticoagulant 24.9 0.0 - 51.9 sec   Dil[place] Viper Venom Time 39.3 0.0 - 47.0 sec   Lupus Anticoag Interp Comment:     Comment: No lupus anticoagulant was detected.  Factor 5 leiden     Status: None (Preliminary result)   Collection Time: 07/03/16 10:01 AM  Result Value Ref Range   Factor V Leiden WILL FOLLOW   Basic metabolic panel     Status: Abnormal   Collection Time: 07/04/16 11:04 AM  Result Value Ref Range   Sodium 134 (L) 135 - 145 mEq/L   Potassium 4.8 3.5 - 5.1 mEq/L   Chloride 100 96 - 112 mEq/L   CO2 25 19 - 32 mEq/L   Glucose, Bld 163 (H) 70 - 99 mg/dL   BUN 30 (H) 6 - 23 mg/dL   Creatinine, Ser 2.84 (H) 0.40 - 1.20 mg/dL   Calcium 9.2 8.4 - 10.5 mg/dL   GFR 21.93 (L) >60.00 mL/min    Assessment/Plan: 1. Hyperkalemia Repeat BMP today after holding losartan. - Basic metabolic panel  2. Chronic kidney disease, unspecified CKD stage Repeat BMP today after holding losartan. Follow-up with Nephrology as scheduled.   Leeanne Rio, PA-C

## 2016-07-07 ENCOUNTER — Emergency Department (HOSPITAL_COMMUNITY): Payer: Medicare Other

## 2016-07-07 ENCOUNTER — Encounter (HOSPITAL_COMMUNITY): Payer: Self-pay | Admitting: Emergency Medicine

## 2016-07-07 ENCOUNTER — Inpatient Hospital Stay (HOSPITAL_COMMUNITY)
Admission: EM | Admit: 2016-07-07 | Discharge: 2016-07-13 | DRG: 071 | Disposition: A | Discharge status: Other Acute Inpatient Hospital | Payer: Medicare Other | Attending: Family Medicine | Admitting: Family Medicine

## 2016-07-07 ENCOUNTER — Telehealth: Payer: Self-pay | Admitting: *Deleted

## 2016-07-07 DIAGNOSIS — R4182 Altered mental status, unspecified: Secondary | ICD-10-CM | POA: Diagnosis not present

## 2016-07-07 DIAGNOSIS — I5032 Chronic diastolic (congestive) heart failure: Secondary | ICD-10-CM | POA: Diagnosis present

## 2016-07-07 DIAGNOSIS — Z7952 Long term (current) use of systemic steroids: Secondary | ICD-10-CM

## 2016-07-07 DIAGNOSIS — G9349 Other encephalopathy: Secondary | ICD-10-CM | POA: Diagnosis not present

## 2016-07-07 DIAGNOSIS — N179 Acute kidney failure, unspecified: Secondary | ICD-10-CM | POA: Diagnosis not present

## 2016-07-07 DIAGNOSIS — E860 Dehydration: Secondary | ICD-10-CM | POA: Diagnosis present

## 2016-07-07 DIAGNOSIS — R9082 White matter disease, unspecified: Secondary | ICD-10-CM | POA: Diagnosis present

## 2016-07-07 DIAGNOSIS — E785 Hyperlipidemia, unspecified: Secondary | ICD-10-CM | POA: Diagnosis present

## 2016-07-07 DIAGNOSIS — Z940 Kidney transplant status: Secondary | ICD-10-CM

## 2016-07-07 DIAGNOSIS — F410 Panic disorder [episodic paroxysmal anxiety] without agoraphobia: Secondary | ICD-10-CM | POA: Diagnosis present

## 2016-07-07 DIAGNOSIS — F329 Major depressive disorder, single episode, unspecified: Secondary | ICD-10-CM | POA: Diagnosis present

## 2016-07-07 DIAGNOSIS — I6529 Occlusion and stenosis of unspecified carotid artery: Secondary | ICD-10-CM | POA: Diagnosis present

## 2016-07-07 DIAGNOSIS — T502X5A Adverse effect of carbonic-anhydrase inhibitors, benzothiadiazides and other diuretics, initial encounter: Secondary | ICD-10-CM | POA: Diagnosis present

## 2016-07-07 DIAGNOSIS — D631 Anemia in chronic kidney disease: Secondary | ICD-10-CM | POA: Diagnosis present

## 2016-07-07 DIAGNOSIS — I130 Hypertensive heart and chronic kidney disease with heart failure and stage 1 through stage 4 chronic kidney disease, or unspecified chronic kidney disease: Secondary | ICD-10-CM | POA: Diagnosis present

## 2016-07-07 DIAGNOSIS — E876 Hypokalemia: Secondary | ICD-10-CM | POA: Diagnosis present

## 2016-07-07 DIAGNOSIS — G35D Multiple sclerosis, unspecified: Secondary | ICD-10-CM

## 2016-07-07 DIAGNOSIS — N184 Chronic kidney disease, stage 4 (severe): Secondary | ICD-10-CM | POA: Diagnosis present

## 2016-07-07 DIAGNOSIS — E1122 Type 2 diabetes mellitus with diabetic chronic kidney disease: Secondary | ICD-10-CM | POA: Diagnosis present

## 2016-07-07 DIAGNOSIS — R443 Hallucinations, unspecified: Secondary | ICD-10-CM

## 2016-07-07 DIAGNOSIS — Z841 Family history of disorders of kidney and ureter: Secondary | ICD-10-CM

## 2016-07-07 DIAGNOSIS — F419 Anxiety disorder, unspecified: Secondary | ICD-10-CM

## 2016-07-07 DIAGNOSIS — R509 Fever, unspecified: Secondary | ICD-10-CM | POA: Diagnosis present

## 2016-07-07 DIAGNOSIS — E119 Type 2 diabetes mellitus without complications: Secondary | ICD-10-CM

## 2016-07-07 DIAGNOSIS — G9340 Encephalopathy, unspecified: Secondary | ICD-10-CM | POA: Diagnosis present

## 2016-07-07 DIAGNOSIS — D649 Anemia, unspecified: Secondary | ICD-10-CM | POA: Diagnosis present

## 2016-07-07 DIAGNOSIS — Z886 Allergy status to analgesic agent status: Secondary | ICD-10-CM

## 2016-07-07 DIAGNOSIS — G35 Multiple sclerosis: Secondary | ICD-10-CM

## 2016-07-07 DIAGNOSIS — R838 Other abnormal findings in cerebrospinal fluid: Secondary | ICD-10-CM | POA: Diagnosis present

## 2016-07-07 DIAGNOSIS — K219 Gastro-esophageal reflux disease without esophagitis: Secondary | ICD-10-CM | POA: Diagnosis present

## 2016-07-07 DIAGNOSIS — Z79899 Other long term (current) drug therapy: Secondary | ICD-10-CM

## 2016-07-07 DIAGNOSIS — Z8249 Family history of ischemic heart disease and other diseases of the circulatory system: Secondary | ICD-10-CM

## 2016-07-07 DIAGNOSIS — I10 Essential (primary) hypertension: Secondary | ICD-10-CM | POA: Diagnosis present

## 2016-07-07 DIAGNOSIS — Z888 Allergy status to other drugs, medicaments and biological substances status: Secondary | ICD-10-CM

## 2016-07-07 LAB — COMPREHENSIVE METABOLIC PANEL
ALK PHOS: 43 U/L (ref 38–126)
ALT: 22 U/L (ref 14–54)
ANION GAP: 9 (ref 5–15)
AST: 22 U/L (ref 15–41)
Albumin: 4.1 g/dL (ref 3.5–5.0)
BILIRUBIN TOTAL: 0.5 mg/dL (ref 0.3–1.2)
BUN: 30 mg/dL — ABNORMAL HIGH (ref 6–20)
CALCIUM: 9.2 mg/dL (ref 8.9–10.3)
CO2: 26 mmol/L (ref 22–32)
CREATININE: 3.11 mg/dL — AB (ref 0.44–1.00)
Chloride: 103 mmol/L (ref 101–111)
GFR, EST AFRICAN AMERICAN: 18 mL/min — AB (ref >60)
GFR, EST NON AFRICAN AMERICAN: 15 mL/min — AB (ref >60)
Glucose, Bld: 200 mg/dL — ABNORMAL HIGH (ref 65–99)
Potassium: 4.2 mmol/L (ref 3.5–5.1)
SODIUM: 138 mmol/L (ref 135–145)
TOTAL PROTEIN: 7.2 g/dL (ref 6.5–8.1)

## 2016-07-07 LAB — CBC
HCT: 30.7 % — ABNORMAL LOW (ref 36.0–46.0)
HEMOGLOBIN: 9.8 g/dL — AB (ref 12.0–15.0)
MCH: 27.4 pg (ref 26.0–34.0)
MCHC: 31.9 g/dL (ref 30.0–36.0)
MCV: 85.8 fL (ref 78.0–100.0)
PLATELETS: 336 10*3/uL (ref 150–400)
RBC: 3.58 MIL/uL — AB (ref 3.87–5.11)
RDW: 14.6 % (ref 11.5–15.5)
WBC: 6.7 10*3/uL (ref 4.0–10.5)

## 2016-07-07 LAB — CBG MONITORING, ED
GLUCOSE-CAPILLARY: 210 mg/dL — AB (ref 65–99)
GLUCOSE-CAPILLARY: 99 mg/dL (ref 65–99)

## 2016-07-07 NOTE — Telephone Encounter (Signed)
Based on this, she needs to go to the ER.  This is a notable difference from when she was here last week.  Please call and talk to family.

## 2016-07-07 NOTE — ED Provider Notes (Signed)
[facility] DEPT Provider Note   CSN: [id number] Arrival date & time: 07/07/16  1722  By signing my name below, I, Margit Banda, attest that this documentation has been prepared under the direction and in the presence of Ocie Cornfield, PA-C. Electronically Signed: Margit Banda, ED Scribe. 07/07/16. 11:13 PM.   History   Chief Complaint Chief Complaint  Patient presents with  . Altered Mental Status    HPI [name] [name] is a 61 y.o. female with a PMHx of CAD, HLD, HTN, DM, and chronic renal failure s/p bilatearl renal transplant, who presents to the Emergency Department complaining of gradually worsening altered mental status for the last few weeks. Associated sx include dizziness, nervousness, disoriented, and blurry vision. Pt was admitted to the hospital on 06/18/16 for similar sx.   Patient was recently hospitalized from 3/28-06/23/16 due to altered mental status. In that admission, pt had LP. She had oligoclonal bands on CSF fluid and MRI Brain findings suggestive of Possible MS vs other Demyelinating disorder Vs autoimmune process (graft-versus-host). Pt was not started with high-dose steroids as patient appeared to be improving clinically from a neuropsychiatric point.  Per pt's sister-in-law, whom she has been living with, she didn't know how to dress herself this morning, was shaking, eating faster then normal, and need help walking to the car. Pt's brother noticed she was not acting normally as well. No h/o or family h/o stroke. Pt denies fever or any other sx at this time.   Patient does not have chest pain, shortness breath, nausea, vomiting, abdominal pain, diarrhea, symptoms of UTI  PCP: Annye Asa, MD  The history is provided by the patient, a relative and medical records. No language interpreter was used.    Past Medical History:  Diagnosis Date  . Adenomatous colon polyp 04/1998  . Anemia   . Carotid artery disease ([place])    Carotid US 1/18:  bilateral ICA 1-39, R thyroid lobe nodule (1.9x2.2x3cm); numerous L thyroid lobe nodules - repeat 1 year  . Chronic kidney disease   . Chronic renal failure    post transplant  . Diabetes mellitus   . Esophagitis    Grade 1 Distal  . GERD (gastroesophageal reflux disease)   . Hemorrhoids   . Hx of cardiovascular stress test    Lexiscan Myoview (06/2013):  No ischemia, EF 66%; normal.  //  Myoview 12/17: EF 62, no ischemia or scar; Normal  . Hx of echocardiogram    a. Echocardiogram (06/2013):  Mod focal basal hypertrophy, EF 60-65%, normal wall motion, Gr 1 DD, mild AI, mildly dilated ascending aorta (41 mm), mild LAE.; b.  Echo 9/16: mod LVH, EF 60-65%, no RWMA, Gr 1 DD, trivial AI, mild dilated ascending aorta, mild LAE  . Hyperkalemia   . Hyperlipidemia   . Hypertension   . Metabolic acidosis   . Pneumonia   . Seizures [facility])     Patient Active Problem List   Diagnosis Date Noted  . Abnormal brain MRI 07/03/2016  . Fasciculation 06/18/2016  . Toxic encephalopathy 06/18/2016  . Dizziness and giddiness 04/29/2016  . Carotid artery disease ([place])   . Physical exam 01/28/2016  . Hyperlipidemia 06/28/2015  . Diabetes mellitus type II, controlled ([place]) 06/28/2015  . Grief 06/28/2015  . Thoracic ascending aortic aneurysm (41 mm on Echo 06/2013) 07/06/2013  . ESRD (end stage renal disease) ([place]) 06/16/2013  . History of renal transplant 06/16/2013  . GERD 08/13/2009  . OTH&UNSPEC NONINFECTIOUS GASTROENTERITIS&COLITIS 08/13/2009  . Essential hypertension  04/25/2009  . HEMORRHOIDS-INTERNAL 04/25/2009  . Chronic kidney disease 04/25/2009  . WEIGHT LOSS 04/25/2009  . NAUSEA AND VOMITING 04/25/2009  . NAUSEA ALONE 04/25/2009  . PERSONAL HX COLONIC POLYPS 04/25/2009    Past Surgical History:  Procedure Laterality Date  . ABDOMINAL HYSTERECTOMY    . AV FISTULA PLACEMENT  07/04/2005   Cimino AV fistula  . AV FISTULA PLACEMENT  08/27/2005  . AV FISTULA PLACEMENT W/ PTFE  08/27/2005    . CESAREAN SECTION    . DG AV DIALYSIS GRAFT DECLOT OR  07/24/2005   AV Gore-Tex graf  . DG AV DIALYSIS GRAFT DECLOT OR  Thrombosis right forearm, loop arteriovenous   Thrombosis right forearm, loop arteriovenous graft  . KIDNEY TRANSPLANT  2009   Both  . THROMBECTOMY / ARTERIOVENOUS GRAFT REVISION  10/12/2006  . THROMBECTOMY / ARTERIOVENOUS GRAFT REVISION  10/16/2006    OB History    No data available       Home Medications    Prior to Admission medications   Medication Sig Start Date End Date Taking? Authorizing Provider  amLODipine (NORVASC) 10 MG tablet Take 10 mg by mouth daily.   Yes Historical Provider, MD  cholecalciferol (VITAMIN D) 1000 units tablet Take 1,000 Units by mouth every other day.   Yes Historical Provider, MD  clonazePAM (KLONOPIN) 1 MG tablet Take 1 tablet (1 mg total) by mouth 2 (two) times daily. 06/23/16  Yes Courage Emokpae, MD  escitalopram (LEXAPRO) 10 MG tablet Take 1 tablet (10 mg total) by mouth daily. 06/24/16  Yes Courage Emokpae, MD  fenofibrate 160 MG tablet Take 160 mg by mouth daily.   Yes Historical Provider, MD  furosemide (LASIX) 40 MG tablet 1 tab twice a day Patient taking differently: Take 40 mg by mouth 2 (two) times daily.  06/23/16  Yes Courage Emokpae, MD  hydrALAZINE (APRESOLINE) 50 MG tablet Take 1 tablet (50 mg total) by mouth 3 (three) times daily. 06/23/16  Yes Courage Emokpae, MD  isosorbide mononitrate (IMDUR) 60 MG 24 hr tablet Take 1 tablet (60 mg total) by mouth daily. 06/23/16 06/18/17 Yes Courage Emokpae, MD  losartan (COZAAR) 100 MG tablet Take 1 tablet (100 mg total) by mouth daily. 06/24/16  Yes Courage Emokpae, MD  Magnesium 250 MG TABS Take 500 mg by mouth daily.   Yes Historical Provider, MD  Multiple Vitamin (MULTIVITAMIN) tablet Take 1 tablet by mouth daily.     Yes Historical Provider, MD  mycophenolate (CELLCEPT) 250 MG capsule Take 750 mg by mouth 2 (two) times daily.    Yes Historical Provider, MD  potassium chloride SA  (K-DUR,KLOR-CON) 20 MEQ tablet Take 1 tablet (20 mEq total) by mouth 2 (two) times daily. 12/24/15  Yes Midge Minium, MD  predniSONE (DELTASONE) 5 MG tablet Take 5 mg by mouth every Monday, Wednesday, and Friday.   Yes Historical Provider, MD  tacrolimus (PROGRAF) 1 MG capsule Take 5 mg by mouth 2 (two) times daily.    Yes Historical Provider, MD  acetaminophen (TYLENOL) 500 MG tablet Take 1,000 mg by mouth every 6 (six) hours as needed for moderate pain.    Historical Provider, MD  cyclobenzaprine (FLEXERIL) 10 MG tablet TAKE 1 TABLET (10 MG TOTAL) BY MOUTH 3 (THREE) TIMES DAILY AS NEEDED FOR MUSCLE SPASMS. 05/07/16   Midge Minium, MD  meclizine (ANTIVERT) 25 MG tablet TAKE 1 TABLET (25 MG TOTAL) BY MOUTH 3 (THREE) TIMES DAILY AS NEEDED FOR DIZZINESS. 04/25/16   [name]  Birdie Riddle, MD  nitroGLYCERIN (NITROSTAT) 0.4 MG SL tablet Place 1 tablet (0.4 mg total) under the tongue every 5 (five) minutes as needed for chest pain. 06/16/13   Liliane Shi, PA-C    Family History Family History  Problem Relation Age of Onset  . Kidney disease Paternal Aunt   . Heart disease Mother   . Heart disease Father   . Colon cancer Neg Hx     Social History Social History  Substance Use Topics  . Smoking status: Never Smoker  . Smokeless tobacco: Never Used  . Alcohol use No     Allergies   Lisinopril and Daypro [oxaprozin]   Review of Systems Review of Systems  Constitutional: Negative for fever.  Eyes: Negative for visual disturbance.  Respiratory: Negative for cough and shortness of breath.   Cardiovascular: Negative for chest pain.  Gastrointestinal: Negative for abdominal pain, diarrhea and nausea.  Genitourinary: Negative for dysuria, flank pain, frequency, hematuria and urgency.  Musculoskeletal: Positive for gait problem. Negative for back pain.  Skin: Negative.   Neurological: Positive for dizziness and weakness.  Psychiatric/Behavioral: Positive for confusion. The patient is  nervous/anxious.      Physical Exam Updated Vital Signs BP (!) 165/102   Pulse 80   Temp 98.2 F (36.8 C) (Oral)   Resp 15   SpO2 100%   Physical Exam  Constitutional: She appears well-developed and well-nourished. No distress.  HENT:  Head: Normocephalic and atraumatic.  Mouth/Throat: Oropharynx is clear and moist.  Eyes: Conjunctivae are normal. Right eye exhibits no discharge. Left eye exhibits no discharge. No scleral icterus.  Neck: Normal range of motion. Neck supple. No thyromegaly present.  Cardiovascular: Normal rate, regular rhythm, normal heart sounds and intact distal pulses.   Pulmonary/Chest: Effort normal and breath sounds normal.  Abdominal: Soft. Bowel sounds are normal. She exhibits no distension. There is no tenderness.  Musculoskeletal: Normal range of motion.  Lymphadenopathy:    She has no cervical adenopathy.  Neurological: She is alert.  Tremulous Oriented to person and time. Disoriented to place.  slowed mentation. Unsteady gait. Reflex normal.   Skin: Skin is warm and dry. No pallor.  Psychiatric: She has a normal mood and affect. Her behavior is normal.  Nursing note and vitals reviewed.    ED Treatments / Results  DIAGNOSTIC STUDIES: Oxygen Saturation is 100% on RA, normal by my interpretation.   COORDINATION OF CARE: 10:45 PM-Discussed next steps with pt which includes an MRI and head CT. Pt verbalized understanding and is agreeable with the plan.    Labs (all labs ordered are listed, but only abnormal results are displayed) Labs Reviewed  URINE CULTURE - Abnormal; Notable for the following:       Result Value   Culture MULTIPLE SPECIES PRESENT, SUGGEST RECOLLECTION (*)    All other components within normal limits  COMPREHENSIVE METABOLIC PANEL - Abnormal; Notable for the following:    Glucose, Bld 200 (*)    BUN 30 (*)    Creatinine, Ser 3.11 (*)    GFR calc non Af Amer 15 (*)    GFR calc Af Amer 18 (*)    All other components  within normal limits  CBC - Abnormal; Notable for the following:    RBC 3.58 (*)    Hemoglobin 9.8 (*)    HCT 30.7 (*)    All other components within normal limits  URINALYSIS, ROUTINE W REFLEX MICROSCOPIC - Abnormal; Notable for the following:    APPearance  CLOUDY (*)    Protein, ur 100 (*)    Bacteria, UA RARE (*)    Squamous Epithelial / LPF TOO NUMEROUS TO COUNT (*)    All other components within normal limits  ACETAMINOPHEN LEVEL - Abnormal; Notable for the following:    Acetaminophen (Tylenol), Serum <10 (*)    All other components within normal limits  RAPID URINE DRUG SCREEN, HOSP PERFORMED - Abnormal; Notable for the following:    Benzodiazepines POSITIVE (*)    All other components within normal limits  BASIC METABOLIC PANEL - Abnormal; Notable for the following:    Glucose, Bld 120 (*)    BUN 31 (*)    Creatinine, Ser 2.83 (*)    GFR calc non Af Amer 17 (*)    GFR calc Af Amer 20 (*)    All other components within normal limits  CBC - Abnormal; Notable for the following:    RBC 3.11 (*)    Hemoglobin 9.1 (*)    HCT 27.0 (*)    All other components within normal limits  GLUCOSE, CAPILLARY - Abnormal; Notable for the following:    Glucose-Capillary 119 (*)    All other components within normal limits  GLUCOSE, CAPILLARY - Abnormal; Notable for the following:    Glucose-Capillary 132 (*)    All other components within normal limits  GLUCOSE, CAPILLARY - Abnormal; Notable for the following:    Glucose-Capillary 116 (*)    All other components within normal limits  IGG CSF INDEX - Abnormal; Notable for the following:    IgG, CSF 23.8 (*)    Albumin 3.4 (*)    IgG/Alb Ratio, CSF 0.92 (*)    CSF IgG Index 3.3 (*)    All other components within normal limits  GLUCOSE, CAPILLARY - Abnormal; Notable for the following:    Glucose-Capillary 106 (*)    All other components within normal limits  CBC - Abnormal; Notable for the following:    RBC 3.32 (*)    Hemoglobin  9.5 (*)    HCT 29.0 (*)    All other components within normal limits  BASIC METABOLIC PANEL - Abnormal; Notable for the following:    Potassium 3.1 (*)    CO2 21 (*)    BUN 26 (*)    Creatinine, Ser 2.65 (*)    Calcium 8.4 (*)    GFR calc non Af Amer 19 (*)    GFR calc Af Amer 22 (*)    All other components within normal limits  GLUCOSE, CAPILLARY - Abnormal; Notable for the following:    Glucose-Capillary 121 (*)    All other components within normal limits  PROTEIN, CSF - Abnormal; Notable for the following:    Total  Protein, CSF 64 (*)    All other components within normal limits  CSF CELL COUNT WITH DIFFERENTIAL - Abnormal; Notable for the following:    Color, CSF STRAW (*)    Appearance, CSF CLEAR (*)    RBC Count, CSF 1,685 (*)    WBC, CSF 26 (*)    Segmented Neutrophils-CSF 7 (*)    Lymphs, CSF 90 (*)    Monocyte-Macrophage-Spinal Fluid 3 (*)    All other components within normal limits  GLUCOSE, CAPILLARY - Abnormal; Notable for the following:    Glucose-Capillary 110 (*)    All other components within normal limits  GLUCOSE, CAPILLARY - Abnormal; Notable for the following:    Glucose-Capillary 105 (*)    All other components  within normal limits  BASIC METABOLIC PANEL - Abnormal; Notable for the following:    Potassium 3.1 (*)    Glucose, Bld 198 (*)    BUN 21 (*)    Creatinine, Ser 2.60 (*)    Calcium 8.6 (*)    GFR calc non Af Amer 19 (*)    GFR calc Af Amer 22 (*)    All other components within normal limits  GLUCOSE, CAPILLARY - Abnormal; Notable for the following:    Glucose-Capillary 138 (*)    All other components within normal limits  GLUCOSE, CAPILLARY - Abnormal; Notable for the following:    Glucose-Capillary 128 (*)    All other components within normal limits  GLUCOSE, CAPILLARY - Abnormal; Notable for the following:    Glucose-Capillary 120 (*)    All other components within normal limits  BASIC METABOLIC PANEL - Abnormal; Notable for the  following:    Glucose, Bld 109 (*)    Creatinine, Ser 2.52 (*)    Calcium 8.8 (*)    GFR calc non Af Amer 20 (*)    GFR calc Af Amer 23 (*)    All other components within normal limits  MAGNESIUM - Abnormal; Notable for the following:    Magnesium 1.0 (*)    All other components within normal limits  GLUCOSE, CAPILLARY - Abnormal; Notable for the following:    Glucose-Capillary 155 (*)    All other components within normal limits  CBC WITH DIFFERENTIAL/PLATELET - Abnormal; Notable for the following:    RBC 3.08 (*)    Hemoglobin 8.9 (*)    HCT 26.9 (*)    All other components within normal limits  GLUCOSE, CAPILLARY - Abnormal; Notable for the following:    Glucose-Capillary 114 (*)    All other components within normal limits  GLUCOSE, CAPILLARY - Abnormal; Notable for the following:    Glucose-Capillary 117 (*)    All other components within normal limits  GLUCOSE, CAPILLARY - Abnormal; Notable for the following:    Glucose-Capillary 102 (*)    All other components within normal limits  MYCOPHENOLIC ACID (CELLCEPT) - Abnormal; Notable for the following:    MPA Glucuronide 189 (*)    All other components within normal limits  GLUCOSE, CAPILLARY - Abnormal; Notable for the following:    Glucose-Capillary 138 (*)    All other components within normal limits  GLUCOSE, CAPILLARY - Abnormal; Notable for the following:    Glucose-Capillary 108 (*)    All other components within normal limits  CBG MONITORING, ED - Abnormal; Notable for the following:    Glucose-Capillary 210 (*)    All other components within normal limits  CULTURE, BLOOD (ROUTINE X 2)  CULTURE, BLOOD (ROUTINE X 2)  URINE CULTURE  ETHANOL  SALICYLATE LEVEL  AMMONIA  TSH  BRAIN NATRIURETIC PEPTIDE  CORTISOL-AM, BLOOD  CREATININE, URINE, RANDOM  UREA NITROGEN, URINE  GLUCOSE, CAPILLARY  GLUCOSE, CAPILLARY  GLUCOSE, CAPILLARY  APTT  JC VIRUS, PCR CSF  VDRL, CSF  GLUCOSE, CAPILLARY  GLUCOSE, CSF    OLIGOCLONAL BANDS, CSF + SERM  GLUCOSE, CAPILLARY  GLUCOSE, CAPILLARY  CBG MONITORING, ED  CYTOLOGY - NON PAP    EKG  EKG Interpretation None       Radiology Ct Head Wo Contrast  Result Date: 07/07/2016 CLINICAL DATA:  Altered mental status, abnormal behavior, forgetfulness. History of end-stage renal disease on dialysis, status post renal transplant. EXAM: CT HEAD WITHOUT CONTRAST TECHNIQUE: Contiguous axial images were obtained  from the base of the skull through the vertex without intravenous contrast. COMPARISON:  MRI of the head June 18, 2016 and MRI of the head June 11, 2016 and MRI of the head May 09, 2014 FINDINGS: BRAIN: No intraparenchymal hemorrhage, mass effect nor midline shift. The ventricles and sulci are normal for age. Patchy supratentorial white matter hypodensities. No acute large vascular territory infarcts. No abnormal extra-axial fluid collections. Basal cisterns are patent. VASCULAR: Mild calcific atherosclerosis of the carotid siphons. SKULL: No skull fracture. No significant scalp soft tissue swelling. SINUSES/ORBITS: The mastoid air-cells and included paranasal sinuses are well-aerated.The included ocular globes and orbital contents are non-suspicious. OTHER: None. IMPRESSION: No acute intracranial process. Stable moderate white matter changes better characterized on recent MRI of the head. Electronically Signed   By: Elon Alas M.D.   On: 07/07/2016 22:59    Procedures Procedures (including critical care time)  Medications Ordered in ED Medications  lidocaine (XYLOCAINE) 1 % (with pres) injection (not administered)  naloxone (NARCAN) injection 2 mg (2 mg Intravenous Given 07/08/16 0034)  hydrocortisone sodium succinate (SOLU-CORTEF) 100 MG injection 50 mg (50 mg Intravenous Given 07/08/16 0535)  lidocaine (PF) (XYLOCAINE) 1 % injection 10 mL (10 mLs Other Given 07/09/16 1503)  LORazepam (ATIVAN) tablet 0.5 mg (0.5 mg Oral Given 07/09/16 1711)   potassium chloride SA (K-DUR,KLOR-CON) CR tablet 40 mEq (40 mEq Oral Given 07/10/16 1355)  LORazepam (ATIVAN) injection 1 mg (1 mg Intravenous Given 07/10/16 1716)  potassium chloride SA (K-DUR,KLOR-CON) CR tablet 40 mEq (40 mEq Oral Given 07/11/16 2139)  LORazepam (ATIVAN) 2 MG/ML injection (1 mg  Given 07/12/16 0932)  LORazepam (ATIVAN) injection 1 mg (0 mg Intravenous Duplicate 1/77/93 9030)  magnesium sulfate IVPB 2 g 50 mL (0 g Intravenous Stopped 07/12/16 1819)     Initial Impression / Assessment and Plan / ED Course  I have reviewed the triage vital signs and the nursing notes.  Pertinent labs & imaging results that were available during my care of the patient were reviewed by me and considered in my medical decision making (see chart for details).     Pt presents with continued ams. No leukocytosis. VS normal. US show no signs of infection. UDS positive for benzo. Normal tylenol and salicylate level. Ammonia normal. Ethanol level normal. Elevated creatine from basline. Pt seems altered and tremulous. Unable to ambulate with normal gait. Ct scan was obtained that showed no acute abnormalities. Spoke with Dr. Nicole Kindred neurology who recommends hospital admit with mri. Hold on high dose steroids at this time. Spoke with Dr. Blaine Hamper who agrees to admit pt. Admission orderes placed. Family up dated on plan of care. Hemodynamically stable at this time.       Final Clinical Impressions(s) / ED Diagnoses   Final diagnoses:  Altered mental status, unspecified altered mental status type    New Prescriptions New Prescriptions   No medications on file   I personally performed the services described in this documentation, which was scribed in my presence. The recorded information has been reviewed and is accurate.     Doristine Devoid, PA-C 07/16/16 1316    Veatrice Kells, MD 07/16/16 2309    Veatrice Kells, MD 07/17/16 [phone]

## 2016-07-07 NOTE — ED Triage Notes (Signed)
Family reports pt has been confused today. Was unable to dress herself this am and was shaky. Pt denies any pain.

## 2016-07-07 NOTE — Telephone Encounter (Signed)
Patient's daughter called and is stating that she is still really concerned about her mother's mental and physical state.  She states that there are still times that she does not know/recognize her family.  She does not seem to be comprehending things that she is told.  Over the last couple days she is repeating herself constantly, getting up at 1am to get dressed for the day, but she's putting her clothes on backwards, shoes on the wrong feet.   Today she came out and said that she was ready to go and she was completely naked - Daughter is very concerned and would really like some answers.   She would like a call back today to let her know what they need to do.

## 2016-07-07 NOTE — Telephone Encounter (Signed)
Spoke with pt sister and advised of PCP recommendations, they are going to try to get her to the hospital whether or not they drive her or have EMS pick her up.

## 2016-07-08 ENCOUNTER — Observation Stay (HOSPITAL_COMMUNITY): Payer: Medicare Other

## 2016-07-08 DIAGNOSIS — R4182 Altered mental status, unspecified: Secondary | ICD-10-CM | POA: Diagnosis not present

## 2016-07-08 DIAGNOSIS — N184 Chronic kidney disease, stage 4 (severe): Secondary | ICD-10-CM | POA: Diagnosis not present

## 2016-07-08 DIAGNOSIS — T502X5A Adverse effect of carbonic-anhydrase inhibitors, benzothiadiazides and other diuretics, initial encounter: Secondary | ICD-10-CM | POA: Diagnosis present

## 2016-07-08 DIAGNOSIS — R838 Other abnormal findings in cerebrospinal fluid: Secondary | ICD-10-CM | POA: Diagnosis present

## 2016-07-08 DIAGNOSIS — D649 Anemia, unspecified: Secondary | ICD-10-CM | POA: Diagnosis not present

## 2016-07-08 DIAGNOSIS — K219 Gastro-esophageal reflux disease without esophagitis: Secondary | ICD-10-CM | POA: Diagnosis not present

## 2016-07-08 DIAGNOSIS — E876 Hypokalemia: Secondary | ICD-10-CM | POA: Diagnosis present

## 2016-07-08 DIAGNOSIS — E119 Type 2 diabetes mellitus without complications: Secondary | ICD-10-CM | POA: Diagnosis not present

## 2016-07-08 DIAGNOSIS — F329 Major depressive disorder, single episode, unspecified: Secondary | ICD-10-CM | POA: Diagnosis present

## 2016-07-08 DIAGNOSIS — E860 Dehydration: Secondary | ICD-10-CM | POA: Diagnosis present

## 2016-07-08 DIAGNOSIS — Z8249 Family history of ischemic heart disease and other diseases of the circulatory system: Secondary | ICD-10-CM | POA: Diagnosis not present

## 2016-07-08 DIAGNOSIS — Z7952 Long term (current) use of systemic steroids: Secondary | ICD-10-CM | POA: Diagnosis not present

## 2016-07-08 DIAGNOSIS — F410 Panic disorder [episodic paroxysmal anxiety] without agoraphobia: Secondary | ICD-10-CM | POA: Diagnosis present

## 2016-07-08 DIAGNOSIS — R443 Hallucinations, unspecified: Secondary | ICD-10-CM | POA: Diagnosis not present

## 2016-07-08 DIAGNOSIS — I10 Essential (primary) hypertension: Secondary | ICD-10-CM | POA: Diagnosis not present

## 2016-07-08 DIAGNOSIS — R836 Abnormal cytological findings in cerebrospinal fluid: Secondary | ICD-10-CM | POA: Diagnosis not present

## 2016-07-08 DIAGNOSIS — G9349 Other encephalopathy: Secondary | ICD-10-CM | POA: Diagnosis not present

## 2016-07-08 DIAGNOSIS — I130 Hypertensive heart and chronic kidney disease with heart failure and stage 1 through stage 4 chronic kidney disease, or unspecified chronic kidney disease: Secondary | ICD-10-CM | POA: Diagnosis not present

## 2016-07-08 DIAGNOSIS — G9340 Encephalopathy, unspecified: Secondary | ICD-10-CM | POA: Diagnosis present

## 2016-07-08 DIAGNOSIS — F419 Anxiety disorder, unspecified: Secondary | ICD-10-CM | POA: Diagnosis not present

## 2016-07-08 DIAGNOSIS — I6529 Occlusion and stenosis of unspecified carotid artery: Secondary | ICD-10-CM | POA: Diagnosis present

## 2016-07-08 DIAGNOSIS — R509 Fever, unspecified: Secondary | ICD-10-CM | POA: Diagnosis present

## 2016-07-08 DIAGNOSIS — Z79899 Other long term (current) drug therapy: Secondary | ICD-10-CM | POA: Diagnosis not present

## 2016-07-08 DIAGNOSIS — I5032 Chronic diastolic (congestive) heart failure: Secondary | ICD-10-CM | POA: Diagnosis not present

## 2016-07-08 DIAGNOSIS — R9082 White matter disease, unspecified: Secondary | ICD-10-CM | POA: Diagnosis present

## 2016-07-08 DIAGNOSIS — R410 Disorientation, unspecified: Secondary | ICD-10-CM | POA: Diagnosis not present

## 2016-07-08 DIAGNOSIS — N179 Acute kidney failure, unspecified: Secondary | ICD-10-CM | POA: Diagnosis present

## 2016-07-08 DIAGNOSIS — D631 Anemia in chronic kidney disease: Secondary | ICD-10-CM | POA: Diagnosis present

## 2016-07-08 DIAGNOSIS — N183 Chronic kidney disease, stage 3 (moderate): Secondary | ICD-10-CM | POA: Diagnosis not present

## 2016-07-08 DIAGNOSIS — E1122 Type 2 diabetes mellitus with diabetic chronic kidney disease: Secondary | ICD-10-CM | POA: Diagnosis not present

## 2016-07-08 DIAGNOSIS — E785 Hyperlipidemia, unspecified: Secondary | ICD-10-CM | POA: Diagnosis not present

## 2016-07-08 DIAGNOSIS — Z841 Family history of disorders of kidney and ureter: Secondary | ICD-10-CM | POA: Diagnosis not present

## 2016-07-08 DIAGNOSIS — I5030 Unspecified diastolic (congestive) heart failure: Secondary | ICD-10-CM | POA: Diagnosis not present

## 2016-07-08 DIAGNOSIS — Z940 Kidney transplant status: Secondary | ICD-10-CM | POA: Diagnosis not present

## 2016-07-08 DIAGNOSIS — G35 Multiple sclerosis: Secondary | ICD-10-CM | POA: Diagnosis not present

## 2016-07-08 LAB — CBC
HEMATOCRIT: 27 % — AB (ref 36.0–46.0)
Hemoglobin: 9.1 g/dL — ABNORMAL LOW (ref 12.0–15.0)
MCH: 29.3 pg (ref 26.0–34.0)
MCHC: 33.7 g/dL (ref 30.0–36.0)
MCV: 86.8 fL (ref 78.0–100.0)
Platelets: 272 10*3/uL (ref 150–400)
RBC: 3.11 MIL/uL — AB (ref 3.87–5.11)
RDW: 14.7 % (ref 11.5–15.5)
WBC: 7.5 10*3/uL (ref 4.0–10.5)

## 2016-07-08 LAB — BRAIN NATRIURETIC PEPTIDE: B NATRIURETIC PEPTIDE 5: 37.3 pg/mL (ref 0.0–100.0)

## 2016-07-08 LAB — RAPID URINE DRUG SCREEN, HOSP PERFORMED
Amphetamines: NOT DETECTED
Barbiturates: NOT DETECTED
Benzodiazepines: POSITIVE — AB
COCAINE: NOT DETECTED
OPIATES: NOT DETECTED
TETRAHYDROCANNABINOL: NOT DETECTED

## 2016-07-08 LAB — SALICYLATE LEVEL: Salicylate Lvl: <7 mg/dL (ref 2.8–30.0)

## 2016-07-08 LAB — BASIC METABOLIC PANEL
Anion gap: 8 (ref 5–15)
BUN: 31 mg/dL — ABNORMAL HIGH (ref 6–20)
CALCIUM: 8.9 mg/dL (ref 8.9–10.3)
CO2: 24 mmol/L (ref 22–32)
CREATININE: 2.83 mg/dL — AB (ref 0.44–1.00)
Chloride: 104 mmol/L (ref 101–111)
GFR calc non Af Amer: 17 mL/min — ABNORMAL LOW (ref >60)
GFR, EST AFRICAN AMERICAN: 20 mL/min — AB (ref >60)
Glucose, Bld: 120 mg/dL — ABNORMAL HIGH (ref 65–99)
Potassium: 3.6 mmol/L (ref 3.5–5.1)
SODIUM: 136 mmol/L (ref 135–145)

## 2016-07-08 LAB — CORTISOL-AM, BLOOD: CORTISOL - AM: 18.1 ug/dL (ref 6.7–22.6)

## 2016-07-08 LAB — URINALYSIS, ROUTINE W REFLEX MICROSCOPIC
BILIRUBIN URINE: NEGATIVE
GLUCOSE, UA: NEGATIVE mg/dL
HGB URINE DIPSTICK: NEGATIVE
Ketones, ur: NEGATIVE mg/dL
Leukocytes, UA: NEGATIVE
Nitrite: NEGATIVE
PH: 5 (ref 5.0–8.0)
Protein, ur: 100 mg/dL — AB
SPECIFIC GRAVITY, URINE: 1.01 (ref 1.005–1.030)

## 2016-07-08 LAB — GLUCOSE, CAPILLARY
GLUCOSE-CAPILLARY: 87 mg/dL (ref 65–99)
Glucose-Capillary: 119 mg/dL — ABNORMAL HIGH (ref 65–99)
Glucose-Capillary: 132 mg/dL — ABNORMAL HIGH (ref 65–99)
Glucose-Capillary: 98 mg/dL (ref 65–99)

## 2016-07-08 LAB — CREATININE, URINE, RANDOM: Creatinine, Urine: 47.09 mg/dL

## 2016-07-08 LAB — TSH: TSH: 1.143 u[IU]/mL (ref 0.350–4.500)

## 2016-07-08 LAB — AMMONIA: Ammonia: 16 umol/L (ref 9–35)

## 2016-07-08 LAB — ACETAMINOPHEN LEVEL: Acetaminophen (Tylenol), Serum: <10 ug/mL — ABNORMAL LOW (ref 10–30)

## 2016-07-08 LAB — ETHANOL: Alcohol, Ethyl (B): <5 mg/dL (ref <5)

## 2016-07-08 MED ORDER — HYDRALAZINE HCL 50 MG PO TABS
50.0000 mg | ORAL_TABLET | Freq: Three times a day (TID) | ORAL | Status: DC
  Administered 2016-07-08 – 2016-07-13 (×17): 50 mg via ORAL
  Filled 2016-07-08 (×18): qty 1

## 2016-07-08 MED ORDER — TACROLIMUS 1 MG PO CAPS
5.0000 mg | ORAL_CAPSULE | Freq: Two times a day (BID) | ORAL | Status: DC
  Administered 2016-07-08 – 2016-07-13 (×11): 5 mg via ORAL
  Filled 2016-07-08 (×12): qty 5

## 2016-07-08 MED ORDER — PREDNISONE 5 MG PO TABS
5.0000 mg | ORAL_TABLET | ORAL | Status: DC
  Administered 2016-07-09 – 2016-07-11 (×2): 5 mg via ORAL
  Filled 2016-07-08 (×2): qty 1

## 2016-07-08 MED ORDER — SODIUM CHLORIDE 0.9% FLUSH
3.0000 mL | Freq: Two times a day (BID) | INTRAVENOUS | Status: DC
  Administered 2016-07-08 – 2016-07-10 (×4): 3 mL via INTRAVENOUS

## 2016-07-08 MED ORDER — ADULT MULTIVITAMIN W/MINERALS CH
ORAL_TABLET | Freq: Every day | ORAL | Status: DC
  Administered 2016-07-08 – 2016-07-13 (×6): 1 via ORAL
  Filled 2016-07-08 (×6): qty 1

## 2016-07-08 MED ORDER — ACETAMINOPHEN 500 MG PO TABS
1000.0000 mg | ORAL_TABLET | Freq: Four times a day (QID) | ORAL | Status: DC | PRN
  Administered 2016-07-10 – 2016-07-13 (×7): 1000 mg via ORAL
  Filled 2016-07-08 (×8): qty 2

## 2016-07-08 MED ORDER — NITROGLYCERIN 0.4 MG SL SUBL: 0.4000 mg | SUBLINGUAL_TABLET | SUBLINGUAL | Status: DC | PRN

## 2016-07-08 MED ORDER — MAGNESIUM OXIDE 400 (241.3 MG) MG PO TABS
400.0000 mg | ORAL_TABLET | Freq: Every day | ORAL | Status: DC
  Administered 2016-07-08 – 2016-07-13 (×6): 400 mg via ORAL
  Filled 2016-07-08 (×6): qty 1

## 2016-07-08 MED ORDER — FENOFIBRATE 160 MG PO TABS
160.0000 mg | ORAL_TABLET | Freq: Every day | ORAL | Status: DC
  Administered 2016-07-08 – 2016-07-13 (×6): 160 mg via ORAL
  Filled 2016-07-08 (×6): qty 1

## 2016-07-08 MED ORDER — AMLODIPINE BESYLATE 10 MG PO TABS
10.0000 mg | ORAL_TABLET | Freq: Every day | ORAL | Status: DC
  Administered 2016-07-08 – 2016-07-13 (×6): 10 mg via ORAL
  Filled 2016-07-08: qty 1
  Filled 2016-07-08: qty 2
  Filled 2016-07-08 (×4): qty 1

## 2016-07-08 MED ORDER — INSULIN ASPART 100 UNIT/ML ~~LOC~~ SOLN
0.0000 [IU] | Freq: Three times a day (TID) | SUBCUTANEOUS | Status: DC
  Administered 2016-07-11 (×2): 1 [IU] via SUBCUTANEOUS
  Administered 2016-07-12: 2 [IU] via SUBCUTANEOUS
  Administered 2016-07-13: 1 [IU] via SUBCUTANEOUS

## 2016-07-08 MED ORDER — SODIUM CHLORIDE 0.9 % IV SOLN
INTRAVENOUS | Status: DC
  Administered 2016-07-08: 05:00:00 via INTRAVENOUS
  Administered 2016-07-10: 1000 mL via INTRAVENOUS
  Administered 2016-07-12 – 2016-07-13 (×3): via INTRAVENOUS

## 2016-07-08 MED ORDER — ISOSORBIDE MONONITRATE ER 60 MG PO TB24
60.0000 mg | ORAL_TABLET | Freq: Every day | ORAL | Status: DC
  Administered 2016-07-08 – 2016-07-13 (×6): 60 mg via ORAL
  Filled 2016-07-08 (×6): qty 1

## 2016-07-08 MED ORDER — ONDANSETRON HCL 4 MG PO TABS: 4.0000 mg | ORAL_TABLET | Freq: Four times a day (QID) | ORAL | Status: DC | PRN

## 2016-07-08 MED ORDER — HYDROCORTISONE NA SUCCINATE PF 100 MG IJ SOLR
50.0000 mg | Freq: Once | INTRAMUSCULAR | Status: AC
  Administered 2016-07-08: 50 mg via INTRAVENOUS
  Filled 2016-07-08: qty 1

## 2016-07-08 MED ORDER — ONDANSETRON HCL 4 MG/2ML IJ SOLN: 4.0000 mg | Freq: Four times a day (QID) | INTRAMUSCULAR | Status: DC | PRN

## 2016-07-08 MED ORDER — FUROSEMIDE 40 MG PO TABS
40.0000 mg | ORAL_TABLET | Freq: Two times a day (BID) | ORAL | Status: DC
  Administered 2016-07-08 – 2016-07-13 (×12): 40 mg via ORAL
  Filled 2016-07-08 (×12): qty 1
  Filled 2016-07-08: qty 2
  Filled 2016-07-08: qty 1

## 2016-07-08 MED ORDER — VITAMIN D 1000 UNITS PO TABS
1000.0000 [IU] | ORAL_TABLET | ORAL | Status: DC
  Administered 2016-07-08 – 2016-07-12 (×3): 1000 [IU] via ORAL
  Filled 2016-07-08 (×3): qty 1

## 2016-07-08 MED ORDER — INSULIN ASPART 100 UNIT/ML ~~LOC~~ SOLN: 0.0000 [IU] | Freq: Every day | SUBCUTANEOUS | Status: DC

## 2016-07-08 MED ORDER — CYCLOBENZAPRINE HCL 10 MG PO TABS: 10.0000 mg | ORAL_TABLET | Freq: Three times a day (TID) | ORAL | Status: DC | PRN

## 2016-07-08 MED ORDER — MECLIZINE HCL 25 MG PO TABS
25.0000 mg | ORAL_TABLET | Freq: Three times a day (TID) | ORAL | Status: DC | PRN
  Filled 2016-07-08: qty 1

## 2016-07-08 MED ORDER — MYCOPHENOLATE MOFETIL 250 MG PO CAPS
750.0000 mg | ORAL_CAPSULE | Freq: Two times a day (BID) | ORAL | Status: DC
  Administered 2016-07-08 – 2016-07-13 (×11): 750 mg via ORAL
  Filled 2016-07-08 (×12): qty 3

## 2016-07-08 MED ORDER — HEPARIN SODIUM (PORCINE) 5000 UNIT/ML IJ SOLN
5000.0000 [IU] | Freq: Three times a day (TID) | INTRAMUSCULAR | Status: DC
  Administered 2016-07-08 – 2016-07-13 (×13): 5000 [IU] via SUBCUTANEOUS
  Filled 2016-07-08 (×14): qty 1

## 2016-07-08 MED ORDER — NALOXONE HCL 2 MG/2ML IJ SOSY
2.0000 mg | PREFILLED_SYRINGE | Freq: Once | INTRAMUSCULAR | Status: AC
  Administered 2016-07-08: 2 mg via INTRAVENOUS
  Filled 2016-07-08: qty 2

## 2016-07-08 MED ORDER — ESCITALOPRAM OXALATE 10 MG PO TABS
10.0000 mg | ORAL_TABLET | Freq: Every day | ORAL | Status: DC
  Administered 2016-07-08 – 2016-07-13 (×6): 10 mg via ORAL
  Filled 2016-07-08 (×6): qty 1

## 2016-07-08 NOTE — ED Provider Notes (Addendum)
Medical screening examination/treatment/procedure(s) were performed by non-physician practitioner and as supervising physician I was immediately available for consultation/collaboration.   EKG Interpretation  Date/Time:  Monday Heriberto Stmartin 16 2018 17:54:43 EDT Ventricular Rate:  96 PR Interval:    QRS Duration: 87 QT Interval:  347 QTC Calculation: 439 R Axis:   -27 Text Interpretation:  Sinus rhythm Borderline left axis deviation Low voltage, extremity and precordial leads Confirmed by [facility]  MD, Mathis Cashman ([id number]) on 07/08/2016 12:07:19 AM        Veatrice Kells, MD 07/08/16 0855    Kayin Kettering, MD 07/17/16 6045

## 2016-07-08 NOTE — Progress Notes (Signed)
Neuro check unremarkable until asked pt to follow my finger with her eyes.  She could not follow directions.  She can move her eyes spontaneously but did not understand the command.  Responses are also slow. Family present and state that she never fully got back to her original mental state of health since last discharge at beginning of April. [name] [name] P [name] [name]

## 2016-07-08 NOTE — ED Notes (Signed)
Attempted to ambulate, but pt started to lose balance while standing.

## 2016-07-08 NOTE — ED Notes (Signed)
Pt was able to walk to the bathroom with very minimal assistance

## 2016-07-08 NOTE — Progress Notes (Signed)
PROGRESS NOTE  [name] [name] [name]  OJJ:[id number] DOB: Jun 07, 1955 DOA: 07/07/2016 PCP: Annye Asa, MD Outpatient Specialists:  Subjective: Patient is awake, alert and oriented 3, but slow.  Brief Narrative:  [name] [name] [name] is [name] [name] with medical history significant of hypertension, hyperlipidemia, diabetes mellitus, GERD, depression, anxiety, thoracic aortic aneurysm, CKD-IV, anemia, carotid artery stenosis, history of kidney transplantation on immunosuppressants, dCHF, who presents AMS. Patient was recently hospitalized from 3/28-06/23/16 due to altered mental status. In that admission, pt had LP. She had oligoclonal bands on CSF fluid and MRI Brain findings suggestive of Possible MS vs other Demyelinating disorder Vs autoimmune process (graft-versus-host). Pt was not started with high-dose steroids as patient appeared to be improving clinically from [name] [name] neuropsychiatric point.  Per her sister-in-law, patient become confused again today. Her talking does not make any sense. She seems to have blurry vision per her sister-in-law. No unilateral weakness, numbness or tingling sensations. No hearing loss. Patient does not have chest pain, shortness breath, nausea, vomiting, abdominal pain, diarrhea, symptoms of UTI.  Assessment & Plan:   Principal Problem:   Acute encephalopathy Active Problems:   Essential hypertension   History of renal transplant   Diabetes mellitus type II, controlled ([place])   Acute renal failure superimposed on stage 4 chronic kidney disease (HCC)   Chronic diastolic CHF (congestive heart failure) (HCC)   Anemia   Acute encephalopathy -Etiology is not clear. Based on the workup in previous admission, it is concerning for possible MS vs other Demyelinating disorder Vs autoimmune process (graft-versus-host). Neurology was consulted, Dr. Nicole Kindred recommended to get MRI of the brain. -Been evaluated recently, neurology to evaluate again. -MRI of brain  without contrast, showed no findings. -Hold her Klonopin -Neurology to evaluate.  HTN: -Continue amlodipine, hydralazine, Lasix -Hold the Cozaar due to worsening renal function -IV hydralazine when necessary  History of renal transplant: -continue cellcept, tacrolimus, prednisone -Solu Cortef 50 mg 1 as  stress dose -check cortisone level in morning  AoCKD-III: Baseline Cre is 2.0-2.4, pt's Cre is 3.11 on admission. Likely due to prerenal secondary to dehydration and continuation of ARB, diruetics - IVF: NS 75 cc/h - Follow up renal function by BMP - Hold Cozar -check FeUrea  DM-II: Last A1c 6.2 on 06/18/16, well controled. Patient is taking not taking meds at home -will decrease Lantus dose from   -SSI -Check H3Z  Chronic diastolic CHF (congestive heart failure) ([place]): 2-D echo on 12/12/14 showed EF 60-65% with grade 1 diastolic dysfunction. Patient does not have edema or JVD. CHF seems to be compensated. -Continue Lasix -Check BNP  Anemia: Hemoglobin 9.8 which was [name] [name] 10.6 on 06/30/16 -F/u CBC   DVT prophylaxis:  Code Status: Full Code Family Communication:  Disposition Plan:  Diet: Diet NPO time specified Except for: Sips with Meds, Ice Chips  Consultants:   None  Procedures:   None  Antimicrobials:   None   Objective: Vitals:   07/08/16 0412 07/08/16 0518 07/08/16 0856 07/08/16 1153  BP:  (!) 173/93 (!) 168/99 (!) 151/97  Pulse:  88 87 87  Resp:   13 13  Temp: 99.6 F (37.6 C) 98.5 F (36.9 C) 99.5 F (37.5 C) 99.7 F (37.6 C)  TempSrc:  Oral Oral Oral  SpO2:  98% 97% 97%  Weight:  65 kg (143 lb 4.8 oz)    Height:  5\' 5"  (1.651 m)      Intake/Output Summary (Last 24 hours) at 07/08/16 1346 Last data filed at 07/08/16  0600  Gross per 24 hour  Intake            43.75 ml  Output                0 ml  Net            43.75 ml   Filed Weights   07/08/16 0518  Weight: 65 kg (143 lb 4.8 oz)    Examination: General exam: Appears calm and  comfortable  Respiratory system: Clear to auscultation. Respiratory effort normal. Cardiovascular system: S1 & S2 heard, RRR. No JVD, murmurs, rubs, gallops or clicks. No pedal edema. Gastrointestinal system: Abdomen is nondistended, soft and nontender. No organomegaly or masses felt. Normal bowel sounds heard. Central nervous system: Alert and oriented. No focal neurological deficits. Extremities: Symmetric 5 x 5 power. Skin: No rashes, lesions or ulcers Psychiatry: Judgement and insight appear normal. Mood & affect appropriate.   Data Reviewed: I have personally reviewed following labs and imaging studies  CBC:  Recent Labs Lab 07/07/16 1830 07/08/16 0450  WBC 6.7 7.5  HGB 9.8* 9.1*  HCT 30.7* 27.0*  MCV 85.8 86.8  PLT 336 267   Basic Metabolic Panel:  Recent Labs Lab 07/04/16 1104 07/07/16 1830 07/08/16 0450  NA 134* 138 136  K 4.8 4.2 3.6  CL 100 103 104  CO2 25 26 24   GLUCOSE 163* 200* 120*  BUN 30* 30* 31*  CREATININE 2.84* 3.11* 2.83*  CALCIUM 9.2 9.2 8.9   GFR: Estimated Creatinine Clearance: 19.5 mL/min ([name] [name]) (by C-G formula based on SCr of 2.83 mg/dL (H)). Liver Function Tests:  Recent Labs Lab 07/07/16 1830  AST 22  ALT 22  ALKPHOS 43  BILITOT 0.5  PROT 7.2  ALBUMIN 4.1   No results for input(s): LIPASE, AMYLASE in the last 168 hours.  Recent Labs Lab 07/08/16 0031  AMMONIA 16   Coagulation Profile: No results for input(s): INR, PROTIME in the last 168 hours. Cardiac Enzymes: No results for input(s): CKTOTAL, CKMB, CKMBINDEX, TROPONINI in the last 168 hours. BNP (last 3 results) No results for input(s): PROBNP in the last 8760 hours. HbA1C: No results for input(s): HGBA1C in the last 72 hours. CBG:  Recent Labs Lab 07/07/16 1800 07/07/16 2209 07/08/16 0743 07/08/16 1150  GLUCAP 210* 99 119* 132*   Lipid Profile: No results for input(s): CHOL, HDL, LDLCALC, TRIG, CHOLHDL, LDLDIRECT in the last 72 hours. Thyroid Function  Tests:  Recent Labs  07/08/16 0031  TSH 1.143   Anemia Panel: No results for input(s): VITAMINB12, FOLATE, FERRITIN, TIBC, IRON, RETICCTPCT in the last 72 hours. Urine analysis:    Component Value Date/Time   COLORURINE YELLOW 07/08/2016 0110   APPEARANCEUR CLOUDY ([name] [name]) 07/08/2016 0110   LABSPEC 1.010 07/08/2016 0110   PHURINE 5.0 07/08/2016 0110   GLUCOSEU NEGATIVE 07/08/2016 0110   HGBUR NEGATIVE 07/08/2016 0110   BILIRUBINUR NEGATIVE 07/08/2016 0110   KETONESUR NEGATIVE 07/08/2016 0110   PROTEINUR 100 ([name] [name]) 07/08/2016 0110   UROBILINOGEN 0.2 09/16/2014 1330   NITRITE NEGATIVE 07/08/2016 0110   LEUKOCYTESUR NEGATIVE 07/08/2016 0110   Sepsis Labs: @LABRCNTIP (procalcitonin:4,lacticidven:4)  )No results found for this or any previous visit (from the past 240 hour(s)).   Invalid input(s): PROCALCITONIN, LACTICACIDVEN   Radiology Studies: Ct Head Wo Contrast  Result Date: 07/07/2016 CLINICAL DATA:  Altered mental status, abnormal behavior, forgetfulness. History of end-stage renal disease on dialysis, status post renal transplant. EXAM: CT HEAD WITHOUT CONTRAST TECHNIQUE: Contiguous axial images were obtained from  the base of the skull through the vertex without intravenous contrast. COMPARISON:  MRI of the head June 18, 2016 and MRI of the head June 11, 2016 and MRI of the head May 09, 2014 FINDINGS: BRAIN: No intraparenchymal hemorrhage, mass effect nor midline shift. The ventricles and sulci are normal for age. Patchy supratentorial white matter hypodensities. No acute large vascular territory infarcts. No abnormal extra-axial fluid collections. Basal cisterns are patent. VASCULAR: Mild calcific atherosclerosis of the carotid siphons. SKULL: No skull fracture. No significant scalp soft tissue swelling. SINUSES/ORBITS: The mastoid air-cells and included paranasal sinuses are well-aerated.The included ocular globes and orbital contents are non-suspicious. OTHER: None. IMPRESSION:  No acute intracranial process. Stable moderate white matter changes better characterized on recent MRI of the head. Electronically Signed   By: Elon Alas M.D.   On: 07/07/2016 22:59   Mr Brain Wo Contrast  Result Date: 07/08/2016 CLINICAL DATA:  Altered mental status.  History of renal transplant. EXAM: MRI HEAD WITHOUT CONTRAST TECHNIQUE: Multiplanar, multiecho pulse sequences of the brain and surrounding structures were obtained without intravenous contrast. COMPARISON:  06/18/2016 FINDINGS: Brain: Bilateral white matter disease with periventricular rim and more patchy, hazy deep white matter hyperintensities. Signal abnormality intermittently seen in the the juxta cortical brain. There is FLAIR hyperintensity in the left pons and in the left middle cerebellar peduncle, the lateral neighboring [name] [name] developmental venous anomaly. There has been no progression since prior. No asymmetric white matter disease typical of PML. Findings again could be related to chronic small vessel ischemia, demyelinating disease (patient has oligoclonal bands), or other autoimmune process/inflammatory vasculopathy. No infarct or acutely or in the interim. No hemorrhage, hydrocephalus, or mass like findings. Vascular: Preserved flow voids. Skull and upper cervical spine: Negative Sinuses/Orbits: Negative IMPRESSION: No acute finding or change in moderate cerebral white matter disease and mild infratentorial signal abnormality. Differential diagnosis described above. Electronically Signed   By: Monte Fantasia M.D.   On: 07/08/2016 09:05        Scheduled Meds: . amLODipine  10 mg Oral Daily  . cholecalciferol  1,000 Units Oral QODAY  . escitalopram  10 mg Oral Daily  . fenofibrate  160 mg Oral Daily  . furosemide  40 mg Oral BID  . heparin  5,000 Units Subcutaneous Q8H  . hydrALAZINE  50 mg Oral TID  . insulin aspart  0-5 Units Subcutaneous QHS  . insulin aspart  0-9 Units Subcutaneous TID WC  . isosorbide  mononitrate  60 mg Oral Daily  . magnesium oxide  400 mg Oral Daily  . multivitamin with minerals   Oral Daily  . mycophenolate  750 mg Oral BID  . [START ON 07/09/2016] predniSONE  5 mg Oral Q M,W,F  . sodium chloride flush  3 mL Intravenous Q12H  . tacrolimus  5 mg Oral BID   Continuous Infusions: . sodium chloride 75 mL/hr at 07/08/16 0525     LOS: 0 days    Time spent: 35 minutes    [name] Mccamy A, MD [facility] Pager [phone]  If 7PM-7AM, please contact night-coverage [email or web address] Password [email or web address] 07/08/2016, 1:46 PM

## 2016-07-08 NOTE — ED Notes (Signed)
Attempted to get out of bed, but was too shaky to stand.

## 2016-07-08 NOTE — H&P (Signed)
History and Physical    [name] [name] VWP:[id number] DOB: [birth year] DOA: 07/07/2016  Referring MD/NP/PA:   PCP: Annye Asa, MD   Patient coming from:  The patient is coming from home.  At baseline, pt is independent for most of ADL.   Chief Complaint:  AMS  HPI: [name] [name] is a 61 y.o. female with medical history significant of hypertension, hyperlipidemia, diabetes mellitus, GERD, depression, anxiety, thoracic aortic aneurysm, CKD-IV, anemia, carotid artery stenosis, history of kidney transplantation on immunosuppressants, dCHF, who presents AMS.  Patient was recently hospitalized from 3/28-06/23/16 due to altered mental status. In that admission, pt had LP. She had oligoclonal bands on CSF fluid and MRI Brain findings suggestive of Possible MS vs other Demyelinating disorder Vs autoimmune process (graft-versus-host). Pt was not started with high-dose steroids as patient appeared to be improving clinically from a neuropsychiatric point.  Per her sister-in-law, patient become confused again today. Her talking does not make any sense. She seems to have blurry vision per her sister-in-law. No unilateral weakness, numbness or tingling sensations. No hearing loss. Patient does not have chest pain, shortness breath, nausea, vomiting, abdominal pain, diarrhea, symptoms of UTI.  ED Course: pt was found to have WBC 6.7, positive urinalysis for benzo, Tylenol level less than 10, salicylate level less than 7, negative urinalysis, ammonia level 16, alcohol level less than 5, worsening renal function, temperature normal, tachycardia, tachypnea, oxygen saturation 96% on room air. CT head is negative for acute intracranial abnormalities. Patient is placed on telemetry bed for observation. Neurology, Dr. Nicole Kindred was consulted by EDP, who recommended MRI for brain  Review of Systems: Could not be reviewed due to altered mental status.  Allergy:  Allergies  Allergen Reactions  . Lisinopril  Shortness Of Breath, Swelling and Other (See Comments)    Throat irritation also. Patient takes losartan and tolerates fine.  [name] [Oxaprozin] Dermatitis and Other (See Comments)    hives    Past Medical History:  Diagnosis Date  . Adenomatous colon polyp 04/1998  . Anemia   . Carotid artery disease ([place])    Carotid US 1/18: bilateral ICA 1-39, R thyroid lobe nodule (1.9x2.2x3cm); numerous L thyroid lobe nodules - repeat 1 year  . Chronic kidney disease   . Chronic renal failure    post transplant  . Diabetes mellitus   . Esophagitis    Grade 1 Distal  . GERD (gastroesophageal reflux disease)   . Hemorrhoids   . Hx of cardiovascular stress test    Lexiscan Myoview (06/2013):  No ischemia, EF 66%; normal.  //  Myoview 12/17: EF 62, no ischemia or scar; Normal  . Hx of echocardiogram    a. Echocardiogram (06/2013):  Mod focal basal hypertrophy, EF 60-65%, normal wall motion, Gr 1 DD, mild AI, mildly dilated ascending aorta (41 mm), mild LAE.; b.  Echo 9/16: mod LVH, EF 60-65%, no RWMA, Gr 1 DD, trivial AI, mild dilated ascending aorta, mild LAE  . Hyperkalemia   . Hyperlipidemia   . Hypertension   . Metabolic acidosis   . Pneumonia   . Seizures ([place])     Past Surgical History:  Procedure Laterality Date  . ABDOMINAL HYSTERECTOMY    . AV FISTULA PLACEMENT  07/04/2005   Cimino AV fistula  . AV FISTULA PLACEMENT  08/27/2005  . AV FISTULA PLACEMENT W/ PTFE  08/27/2005  . CESAREAN SECTION    . DG AV DIALYSIS GRAFT DECLOT OR  07/24/2005   AV Gore-Tex graf  .  DG AV DIALYSIS GRAFT DECLOT OR  Thrombosis right forearm, loop arteriovenous   Thrombosis right forearm, loop arteriovenous graft  . KIDNEY TRANSPLANT  2009   Both  . THROMBECTOMY / ARTERIOVENOUS GRAFT REVISION  10/12/2006  . THROMBECTOMY / ARTERIOVENOUS GRAFT REVISION  10/16/2006    Social History:  reports that she has never smoked. She has never used smokeless tobacco. She reports that she does not drink alcohol or  use drugs.  Family History:  Family History  Problem Relation Age of Onset  . Kidney disease Paternal Aunt   . Heart disease Mother   . Heart disease Father   . Colon cancer Neg Hx      Prior to Admission medications   Medication Sig Start Date End Date Taking? Authorizing Provider  amLODipine (NORVASC) 10 MG tablet Take 10 mg by mouth daily.   Yes Historical Provider, MD  cholecalciferol (VITAMIN D) 1000 units tablet Take 1,000 Units by mouth every other day.   Yes Historical Provider, MD  clonazePAM (KLONOPIN) 1 MG tablet Take 1 tablet (1 mg total) by mouth 2 (two) times daily. 06/23/16  Yes Courage Emokpae, MD  escitalopram (LEXAPRO) 10 MG tablet Take 1 tablet (10 mg total) by mouth daily. 06/24/16  Yes Courage Emokpae, MD  fenofibrate 160 MG tablet Take 160 mg by mouth daily.   Yes Historical Provider, MD  furosemide (LASIX) 40 MG tablet 1 tab twice a day Patient taking differently: Take 40 mg by mouth 2 (two) times daily.  06/23/16  Yes Courage Emokpae, MD  hydrALAZINE (APRESOLINE) 50 MG tablet Take 1 tablet (50 mg total) by mouth 3 (three) times daily. 06/23/16  Yes Courage Emokpae, MD  isosorbide mononitrate (IMDUR) 60 MG 24 hr tablet Take 1 tablet (60 mg total) by mouth daily. 06/23/16 06/18/17 Yes Courage Emokpae, MD  losartan (COZAAR) 100 MG tablet Take 1 tablet (100 mg total) by mouth daily. 06/24/16  Yes Courage Emokpae, MD  Magnesium 250 MG TABS Take 500 mg by mouth daily.   Yes Historical Provider, MD  Multiple Vitamin (MULTIVITAMIN) tablet Take 1 tablet by mouth daily.     Yes Historical Provider, MD  mycophenolate (CELLCEPT) 250 MG capsule Take 750 mg by mouth 2 (two) times daily.    Yes Historical Provider, MD  potassium chloride SA (K-DUR,KLOR-CON) 20 MEQ tablet Take 1 tablet (20 mEq total) by mouth 2 (two) times daily. 12/24/15  Yes Midge Minium, MD  predniSONE (DELTASONE) 5 MG tablet Take 5 mg by mouth every Monday, Wednesday, and Friday.   Yes Historical Provider, MD    tacrolimus (PROGRAF) 1 MG capsule Take 5 mg by mouth 2 (two) times daily.    Yes Historical Provider, MD  acetaminophen (TYLENOL) 500 MG tablet Take 1,000 mg by mouth every 6 (six) hours as needed for moderate pain.    Historical Provider, MD  cyclobenzaprine (FLEXERIL) 10 MG tablet TAKE 1 TABLET (10 MG TOTAL) BY MOUTH 3 (THREE) TIMES DAILY AS NEEDED FOR MUSCLE SPASMS. 05/07/16   Midge Minium, MD  meclizine (ANTIVERT) 25 MG tablet TAKE 1 TABLET (25 MG TOTAL) BY MOUTH 3 (THREE) TIMES DAILY AS NEEDED FOR DIZZINESS. 04/25/16   Midge Minium, MD  nitroGLYCERIN (NITROSTAT) 0.4 MG SL tablet Place 1 tablet (0.4 mg total) under the tongue every 5 (five) minutes as needed for chest pain. 06/16/13   Liliane Shi, PA-C    Physical Exam: Vitals:   07/08/16 0100 07/08/16 7564 07/08/16 0412 07/08/16 0518  BP: [name])  159/108 (!) 163/101  (!) 173/93  Pulse: 94 83  88  Resp: (!) 24 13    Temp:   99.6 F (37.6 C) 98.5 F (36.9 C)  TempSrc:    Oral  SpO2: 98% 98%  98%  Weight:    65 kg (143 lb 4.8 oz)  Height:    5\' 5"  (1.651 m)   General: Not in acute distress HEENT:       Eyes: PERRL, EOMI, no scleral icterus.       ENT: No discharge from the ears and nose, no pharynx injection, no tonsillar enlargement.        Neck: No JVD, no bruit, no mass felt. Heme: No neck lymph node enlargement. Cardiac: S1/S2, RRR, No murmurs, No gallops or rubs. Respiratory: No rales, wheezing, rhonchi or rubs. GI: Soft, nondistended, nontender, no rebound pain, no organomegaly, BS present. GU: No hematuria Ext: No pitting leg edema bilaterally. 2+DP/PT pulse bilaterally. Musculoskeletal: No joint deformities, No joint redness or warmth, no limitation of ROM in spin. Skin: No rashes.  Neuro: Alert, not oriented X3, cranial nerves II-XII grossly intact, moves all extremities normally. Has nystagmus in right eye. Psych: Patient is not psychotic.  Labs on Admission: I have personally reviewed following labs and  imaging studies  CBC:  Recent Labs Lab 07/07/16 1830 07/08/16 0450  WBC 6.7 7.5  HGB 9.8* 9.1*  HCT 30.7* 27.0*  MCV 85.8 86.8  PLT 336 371   Basic Metabolic Panel:  Recent Labs Lab 07/04/16 1104 07/07/16 1830 07/08/16 0450  NA 134* 138 136  K 4.8 4.2 3.6  CL 100 103 104  CO2 25 26 24   GLUCOSE 163* 200* 120*  BUN 30* 30* 31*  CREATININE 2.84* 3.11* 2.83*  CALCIUM 9.2 9.2 8.9   GFR: Estimated Creatinine Clearance: 19.5 mL/min (A) (by C-G formula based on SCr of 2.83 mg/dL (H)). Liver Function Tests:  Recent Labs Lab 07/07/16 1830  AST 22  ALT 22  ALKPHOS 43  BILITOT 0.5  PROT 7.2  ALBUMIN 4.1   No results for input(s): LIPASE, AMYLASE in the last 168 hours.  Recent Labs Lab 07/08/16 0031  AMMONIA 16   Coagulation Profile: No results for input(s): INR, PROTIME in the last 168 hours. Cardiac Enzymes: No results for input(s): CKTOTAL, CKMB, CKMBINDEX, TROPONINI in the last 168 hours. BNP (last 3 results) No results for input(s): PROBNP in the last 8760 hours. HbA1C: No results for input(s): HGBA1C in the last 72 hours. CBG:  Recent Labs Lab 07/07/16 1800 07/07/16 2209  GLUCAP 210* 99   Lipid Profile: No results for input(s): CHOL, HDL, LDLCALC, TRIG, CHOLHDL, LDLDIRECT in the last 72 hours. Thyroid Function Tests:  Recent Labs  07/08/16 0031  TSH 1.143   Anemia Panel: No results for input(s): VITAMINB12, FOLATE, FERRITIN, TIBC, IRON, RETICCTPCT in the last 72 hours. Urine analysis:    Component Value Date/Time   COLORURINE YELLOW 07/08/2016 0110   APPEARANCEUR CLOUDY (A) 07/08/2016 0110   LABSPEC 1.010 07/08/2016 0110   PHURINE 5.0 07/08/2016 0110   GLUCOSEU NEGATIVE 07/08/2016 0110   HGBUR NEGATIVE 07/08/2016 0110   BILIRUBINUR NEGATIVE 07/08/2016 0110   KETONESUR NEGATIVE 07/08/2016 0110   PROTEINUR 100 (A) 07/08/2016 0110   UROBILINOGEN 0.2 09/16/2014 1330   NITRITE NEGATIVE 07/08/2016 0110   LEUKOCYTESUR NEGATIVE 07/08/2016  0110   Sepsis Labs: @LABRCNTIP (procalcitonin:4,lacticidven:4) )No results found for this or any previous visit (from the past 240 hour(s)).   Radiological Exams on Admission: Ct  Head Wo Contrast  Result Date: 07/07/2016 CLINICAL DATA:  Altered mental status, abnormal behavior, forgetfulness. History of end-stage renal disease on dialysis, status post renal transplant. EXAM: CT HEAD WITHOUT CONTRAST TECHNIQUE: Contiguous axial images were obtained from the base of the skull through the vertex without intravenous contrast. COMPARISON:  MRI of the head June 18, 2016 and MRI of the head June 11, 2016 and MRI of the head May 09, 2014 FINDINGS: BRAIN: No intraparenchymal hemorrhage, mass effect nor midline shift. The ventricles and sulci are normal for age. Patchy supratentorial white matter hypodensities. No acute large vascular territory infarcts. No abnormal extra-axial fluid collections. Basal cisterns are patent. VASCULAR: Mild calcific atherosclerosis of the carotid siphons. SKULL: No skull fracture. No significant scalp soft tissue swelling. SINUSES/ORBITS: The mastoid air-cells and included paranasal sinuses are well-aerated.The included ocular globes and orbital contents are non-suspicious. OTHER: None. IMPRESSION: No acute intracranial process. Stable moderate white matter changes better characterized on recent MRI of the head. Electronically Signed   By: Elon Alas M.D.   On: 07/07/2016 22:59     EKG: Independently reviewed.  Sinus rhythm, QTC 439, anteroseptal infarction pattern.  Assessment/Plan Principal Problem:   Acute encephalopathy Active Problems:   Essential hypertension   History of renal transplant   Diabetes mellitus type II, controlled ([place])   Acute renal failure superimposed on stage 4 chronic kidney disease ([facility])   Chronic diastolic CHF (congestive heart failure) ([facility])   Anemia   Acute encephalopathy: Etiology is not clear. Based on the workup in previous  admission, it is concerning for possible MS vs other Demyelinating disorder Vs autoimmune process (graft-versus-host). Neurology was consulted, Dr. Nicole Kindred recommended to get MRI of the brain.  -Will place on telemetry bed for observation -MRI of brain without constrast -Hold her Klonopin -Swallowing screen -Frequent neuro check  HTN: -Continue amlodipine, hydralazine, Lasix -Hold the Cozaar due to worsening renal function -IV hydralazine when necessary  History of renal transplant: -continue cellcept, tacrolimus, prednisone -Solu Cortef 50 mg 1 as  stress dose -check cortisone level in morning  AoCKD-III: Baseline Cre is 2.0-2.4, pt's Cre is 3.11 on admission. Likely due to prerenal secondary to dehydration and continuation of ARB, diruetics - IVF: NS 75 cc/h - Follow up renal function by BMP - Hold [place] -check FeUrea  DM-II: Last A1c 6.2 on 06/18/16, well controled. Patient is taking not taking meds at home -will decrease Lantus dose from   -SSI -Check B7S  Chronic diastolic CHF (congestive heart failure) ([place]): 2-D echo on 12/12/14 showed EF 60-65% with grade 1 diastolic dysfunction. Patient does not like the edema or JVD. CHF seems to be compensated. -Continue Lasix -Check BNP  Anemia: Hemoglobin 9.8 which was a 10.6 on 06/30/16 -f/u CBC   DVT ppx: SQ Lovenox Code Status: Full code Family Communication:  Yes, patient's sister in-law and friend  at bed side Disposition Plan:  Anticipate discharge back to previous home environment Consults called:  Neuro, Dr. Nicole Kindred Admission status: Obs / tele    Date of Service 07/08/2016    Ivor Costa [facility] Pager [phone]  If 7PM-7AM, please contact night-coverage [email or web address] Password [email or web address] 07/08/2016, 7:05 AM

## 2016-07-08 NOTE — ED Notes (Signed)
ED Provider at bedside. 

## 2016-07-09 ENCOUNTER — Telehealth: Payer: Self-pay | Admitting: Neurology

## 2016-07-09 ENCOUNTER — Inpatient Hospital Stay (HOSPITAL_COMMUNITY)
Admit: 2016-07-09 | Discharge: 2016-07-09 | Disposition: A | Discharge status: Home / Self Care | Payer: Medicare Other | Attending: Neurology | Admitting: Neurology

## 2016-07-09 ENCOUNTER — Ambulatory Visit: Payer: Medicare Other | Admitting: Neurology

## 2016-07-09 DIAGNOSIS — N179 Acute kidney failure, unspecified: Secondary | ICD-10-CM

## 2016-07-09 DIAGNOSIS — I10 Essential (primary) hypertension: Secondary | ICD-10-CM

## 2016-07-09 DIAGNOSIS — E1122 Type 2 diabetes mellitus with diabetic chronic kidney disease: Secondary | ICD-10-CM

## 2016-07-09 DIAGNOSIS — Z940 Kidney transplant status: Secondary | ICD-10-CM

## 2016-07-09 DIAGNOSIS — N184 Chronic kidney disease, stage 4 (severe): Secondary | ICD-10-CM

## 2016-07-09 LAB — PARANEOPLASTIC PROFILE 1
Neuronal Nuc Ab (Ri), IFA: <1:10 {titer}
Purkinje Cell (Yo) Autoantobodies- IFA: <1:10 {titer}

## 2016-07-09 LAB — GLUCOSE, CAPILLARY
Glucose-Capillary: 95 mg/dL (ref 65–99)
Glucose-Capillary: 116 mg/dL — ABNORMAL HIGH (ref 65–99)
Glucose-Capillary: 106 mg/dL — ABNORMAL HIGH (ref 65–99)
Glucose-Capillary: 121 mg/dL — ABNORMAL HIGH (ref 65–99)

## 2016-07-09 LAB — UREA NITROGEN, URINE: UREA NITROGEN UR: 215 mg/dL

## 2016-07-09 LAB — FACTOR 5 LEIDEN

## 2016-07-09 LAB — LUPUS ANTICOAGULANT
DPT CONFIRM RATIO: 1.18 ratio (ref 0.00–1.40)
DPT: 41.2 s (ref 0.0–55.0)
DRVVT: 39.3 s (ref 0.0–47.0)
PTT LA: 24.9 s (ref 0.0–51.9)
THROMBIN TIME: 22.2 s (ref 0.0–23.0)

## 2016-07-09 LAB — APTT: APTT: 24 s (ref 24–36)

## 2016-07-09 LAB — URINE CULTURE

## 2016-07-09 LAB — HEPATITIS B SURFACE ANTIGEN: Hepatitis B Surface Ag: NEGATIVE

## 2016-07-09 LAB — HEPATITIS C ANTIBODY: Hep C Virus Ab: <0.1 s/co ratio (ref 0.0–0.9)

## 2016-07-09 LAB — GLIADIN ANTIBODIES, SERUM
ANTIGLIADIN ABS, IGA: 3 U (ref 0–19)
GLIADIN IGG: 2 U (ref 0–19)

## 2016-07-09 MED ORDER — CLONAZEPAM 1 MG PO TABS
1.0000 mg | ORAL_TABLET | Freq: Two times a day (BID) | ORAL | Status: DC
  Administered 2016-07-10: 1 mg via ORAL
  Filled 2016-07-09 (×3): qty 1

## 2016-07-09 MED ORDER — LORAZEPAM 0.5 MG PO TABS
0.5000 mg | ORAL_TABLET | Freq: Once | ORAL | Status: AC
  Administered 2016-07-09: 0.5 mg via ORAL
  Filled 2016-07-09: qty 1

## 2016-07-09 MED ORDER — CLONAZEPAM 1 MG PO TABS: 1.0000 mg | ORAL_TABLET | Freq: Two times a day (BID) | ORAL | Status: DC

## 2016-07-09 MED ORDER — LIDOCAINE HCL (PF) 1 % IJ SOLN
10.0000 mL | Freq: Once | INTRAMUSCULAR | Status: AC
  Administered 2016-07-09: 10 mL
  Filled 2016-07-09: qty 10

## 2016-07-09 NOTE — Consult Note (Signed)
NEURO HOSPITALIST CONSULT NOTE   Requestig physician: Dr. Lonny Prude   Reason for Consult: SMD   History obtained from:  Chart     HPI:                                                                                                                                          [name] [name] is an 61 y.o. female who was recently in the hospital for similar presentation.   Brief synopsis: 61 year old left-handed black female with a history of end-stage renal disease, status post renal transplant, currently being treated with CellCept. The patient has been noted to have a progression of white matter changes in the brain from 2007 to the present. The patient has a patchy bilateral white matter abnormality seen on recent MRI brain scans. The patient has undergone lumbar puncture which revealed evidence of oligoclonal banding. Spinal fluid protein was also slightly elevated. The patient was set up for MRI of the cervical spine and a visual evoked response test to help confirm the diagnosis of multiple sclerosis. These studies have not yet been done. The patient has significant underlying anxiety, she is being treated with clonazepam taking 1 mg twice daily. The patient was admitted to the hospital on 06/18/2016 for altered mental status with tremors and hallucinations and emotional lability. This likely was a stress reaction associated with the potential diagnosis of multiple sclerosis. The patient had 2 MRI brain evaluations during that hospitalization that were unchanged from the original study. Per last office visit on 07/03/2016 at [facility] "The patient will undergo cervical spine MRI evaluation and a visual evoked response test. "   Per daughter a couple days ago she began to see hallucinations of people again and was walking around naked thinking she was dressed. Per daughter her  Klonopin was stopped by Dr. Jannifer Franklin " do to the thought it may be causing the hallucinations"  Patient  returns to ED  Due to increased confusion. MRI WO contrast was obtained and showed no abnormality. Neurology was asked to see patient again.     Past Medical History:  Diagnosis Date  . Adenomatous colon polyp 04/1998  . Anemia   . Carotid artery disease ([place])    Carotid US 1/18: bilateral ICA 1-39, R thyroid lobe nodule (1.9x2.2x3cm); numerous L thyroid lobe nodules - repeat 1 year  . Chronic kidney disease   . Chronic renal failure    post transplant  . Diabetes mellitus   . Esophagitis    Grade 1 Distal  . GERD (gastroesophageal reflux disease)   . Hemorrhoids   . Hx of cardiovascular stress test    Lexiscan Myoview (06/2013):  No ischemia, EF 66%; normal.  //  Myoview 12/17: EF 62, no ischemia or scar; Normal  .  Hx of echocardiogram    a. Echocardiogram (06/2013):  Mod focal basal hypertrophy, EF 60-65%, normal wall motion, Gr 1 DD, mild AI, mildly dilated ascending aorta (41 mm), mild LAE.; b.  Echo 9/16: mod LVH, EF 60-65%, no RWMA, Gr 1 DD, trivial AI, mild dilated ascending aorta, mild LAE  . Hyperkalemia   . Hyperlipidemia   . Hypertension   . Metabolic acidosis   . Pneumonia   . Seizures ([place])     Past Surgical History:  Procedure Laterality Date  . ABDOMINAL HYSTERECTOMY    . AV FISTULA PLACEMENT  07/04/2005   Cimino AV fistula  . AV FISTULA PLACEMENT  08/27/2005  . AV FISTULA PLACEMENT W/ PTFE  08/27/2005  . CESAREAN SECTION    . DG AV DIALYSIS GRAFT DECLOT OR  07/24/2005   AV Gore-Tex graf  . DG AV DIALYSIS GRAFT DECLOT OR  Thrombosis right forearm, loop arteriovenous   Thrombosis right forearm, loop arteriovenous graft  . KIDNEY TRANSPLANT  2009   Both  . THROMBECTOMY / ARTERIOVENOUS GRAFT REVISION  10/12/2006  . THROMBECTOMY / ARTERIOVENOUS GRAFT REVISION  10/16/2006    Family History  Problem Relation Age of Onset  . Kidney disease Paternal Aunt   . Heart disease Mother   . Heart disease Father   . Colon cancer Neg Hx       Social History:   reports that she has never smoked. She has never used smokeless tobacco. She reports that she does not drink alcohol or use drugs.  Allergies  Allergen Reactions  . Lisinopril Shortness Of Breath, Swelling and Other (See Comments)    Throat irritation also. Patient takes losartan and tolerates fine.  [name] [name] [Oxaprozin] Dermatitis and Other (See Comments)    hives    MEDICATIONS:                                                                                                                     Scheduled: . amLODipine  10 mg Oral Daily  . cholecalciferol  1,000 Units Oral QODAY  . escitalopram  10 mg Oral Daily  . fenofibrate  160 mg Oral Daily  . furosemide  40 mg Oral BID  . heparin  5,000 Units Subcutaneous Q8H  . hydrALAZINE  50 mg Oral TID  . insulin aspart  0-5 Units Subcutaneous QHS  . insulin aspart  0-9 Units Subcutaneous TID WC  . isosorbide mononitrate  60 mg Oral Daily  . lidocaine (PF)  10 mL Other Once  . magnesium oxide  400 mg Oral Daily  . multivitamin with minerals   Oral Daily  . mycophenolate  750 mg Oral BID  . predniSONE  5 mg Oral Q M,W,F  . sodium chloride flush  3 mL Intravenous Q12H  . tacrolimus  5 mg Oral BID     ROS:  History obtained from unobtainable from patient due to mental status    Blood pressure (!) 170/95, pulse 99, temperature 99 F (37.2 C), temperature source Oral, resp. rate 18, height 5\' 5"  (1.651 m), weight 65 kg (143 lb 3.2 oz), SpO2 100 %.   Neurologic Examination:                                                                                                      HEENT-  Normocephalic, no lesions, without obvious abnormality.  Normal external eye and conjunctiva.  Normal TM's bilaterally.  Normal auditory canals and external ears. Normal external nose, mucus membranes and septum.  Normal  pharynx. Cardiovascular- S1, S2 normal, pulses palpable throughout   Lungs- chest clear, no wheezing, rales, normal symmetric air entry Abdomen- normal findings: bowel sounds normal Extremities- no edema Lymph-no adenopathy palpable Musculoskeletal-no joint tenderness, deformity or swelling Skin-warm and dry, no hyperpigmentation, vitiligo, or suspicious lesions  Neurological Examination Mental Status: Alert, oriented, thought content appropriate.  Very anxious. Speech fluent without evidence of aphasia.  Able to follow 3 step commands with some difficulty needing multiple prompting at times to distraction. Cranial Nerves: II: Visual fields grossly normal, pupils equal, round, reactive to light and accommodation III,IV, VI: ptosis not present, extra-ocular motions intact bilaterally V,VII: smile symmetric, facial light touch sensation normal bilaterally VIII: hearing normal bilaterally IX,X: uvula rises symmetrically XI: bilateral shoulder shrug XII: midline tongue extension Motor: Right : Upper extremity   5/5    Left:     Upper extremity   5/5  Lower extremity   5/5     Lower extremity   5/5 Tone and bulk:normal tone throughout; no atrophy noted Sensory: Pinprick and light touch intact throughout, bilaterally Deep Tendon Reflexes: 2+ and symmetric throughout Plantars: Right: downgoing   Left: downgoing Cerebellar: normal finger-to-nose, normal rapid alternating movements and normal heel-to-shin test Not tested    Lab Results: Basic Metabolic Panel:  Recent Labs Lab 07/04/16 1104 07/07/16 1830 07/08/16 0450  NA 134* 138 136  K 4.8 4.2 3.6  CL 100 103 104  CO2 25 26 24   GLUCOSE 163* 200* 120*  BUN 30* 30* 31*  CREATININE 2.84* 3.11* 2.83*  CALCIUM 9.2 9.2 8.9    Liver Function Tests:  Recent Labs Lab 07/07/16 1830  AST 22  ALT 22  ALKPHOS 43  BILITOT 0.5  PROT 7.2  ALBUMIN 4.1   No results for input(s): LIPASE, AMYLASE in the last 168 hours.  Recent  Labs Lab 07/08/16 0031  AMMONIA 16    CBC:  Recent Labs Lab 07/07/16 1830 07/08/16 0450  WBC 6.7 7.5  HGB 9.8* 9.1*  HCT 30.7* 27.0*  MCV 85.8 86.8  PLT 336 272    Cardiac Enzymes: No results for input(s): CKTOTAL, CKMB, CKMBINDEX, TROPONINI in the last 168 hours.  Lipid Panel: No results for input(s): CHOL, TRIG, HDL, CHOLHDL, VLDL, LDLCALC in the last 168 hours.  CBG:  Recent Labs Lab 07/08/16 1150 07/08/16 1703 07/08/16 2118 07/09/16 0733 07/09/16 1157  GLUCAP 132* 98 87 95 116*  Microbiology: Results for orders placed or performed during the hospital encounter of 07/07/16  Urine culture     Status: Abnormal   Collection Time: 07/08/16  1:10 AM  Result Value Ref Range Status   Specimen Description URINE, RANDOM  Final   Special Requests NONE  Final   Culture MULTIPLE SPECIES PRESENT, SUGGEST RECOLLECTION (A)  Final   Report Status 07/09/2016 FINAL  Final    Coagulation Studies: No results for input(s): LABPROT, INR in the last 72 hours.  Imaging: Ct Head Wo Contrast  Result Date: 07/07/2016 CLINICAL DATA:  Altered mental status, abnormal behavior, forgetfulness. History of end-stage renal disease on dialysis, status post renal transplant. EXAM: CT HEAD WITHOUT CONTRAST TECHNIQUE: Contiguous axial images were obtained from the base of the skull through the vertex without intravenous contrast. COMPARISON:  MRI of the head June 18, 2016 and MRI of the head June 11, 2016 and MRI of the head May 09, 2014 FINDINGS: BRAIN: No intraparenchymal hemorrhage, mass effect nor midline shift. The ventricles and sulci are normal for age. Patchy supratentorial white matter hypodensities. No acute large vascular territory infarcts. No abnormal extra-axial fluid collections. Basal cisterns are patent. VASCULAR: Mild calcific atherosclerosis of the carotid siphons. SKULL: No skull fracture. No significant scalp soft tissue swelling. SINUSES/ORBITS: The mastoid air-cells  and included paranasal sinuses are well-aerated.The included ocular globes and orbital contents are non-suspicious. OTHER: None. IMPRESSION: No acute intracranial process. Stable moderate white matter changes better characterized on recent MRI of the head. Electronically Signed   By: Elon Alas M.D.   On: 07/07/2016 22:59   Mr Brain Wo Contrast  Result Date: 07/08/2016 CLINICAL DATA:  Altered mental status.  History of renal transplant. EXAM: MRI HEAD WITHOUT CONTRAST TECHNIQUE: Multiplanar, multiecho pulse sequences of the brain and surrounding structures were obtained without intravenous contrast. COMPARISON:  06/18/2016 FINDINGS: Brain: Bilateral white matter disease with periventricular rim and more patchy, hazy deep white matter hyperintensities. Signal abnormality intermittently seen in the the juxta cortical brain. There is FLAIR hyperintensity in the left pons and in the left middle cerebellar peduncle, the lateral neighboring a developmental venous anomaly. There has been no progression since prior. No asymmetric white matter disease typical of PML. Findings again could be related to chronic small vessel ischemia, demyelinating disease (patient has oligoclonal bands), or other autoimmune process/inflammatory vasculopathy. No infarct or acutely or in the interim. No hemorrhage, hydrocephalus, or mass like findings. Vascular: Preserved flow voids. Skull and upper cervical spine: Negative Sinuses/Orbits: Negative IMPRESSION: No acute finding or change in moderate cerebral white matter disease and mild infratentorial signal abnormality. Differential diagnosis described above. Electronically Signed   By: Monte Fantasia M.D.   On: 07/08/2016 09:05       Assessment and plan per attending neurologist  Etta Quill PA-C [facility] [phone]  07/09/2016, 1:29 PM   Assessment/Plan: 61 YO female with positive oligoclonal bands, Stable diffuse periventricular and subcortical  white matter disease bilaterally in a pattern typical of multiple sclerosis, now with increased hallucinations and anxiety.  LP attempted but unable to be obtained and will be done under fluoroscopy. Talked with Dr. Jannifer Franklin who did not take her off the Klonopin and would like her back on 1 mg BID.  Will also order MRI cervical spine W/WO contrast as planned.

## 2016-07-09 NOTE — Progress Notes (Signed)
PROGRESS NOTE    [name] [name]  OEV:[id number] DOB: [birth year] DOA: 07/07/2016 PCP: Annye Asa, MD   Brief Narrative: [name] [name] is a 61 y.o. female with medical history significant of hypertension, hyperlipidemia, diabetes mellitus, GERD, depression, anxiety, thoracic aortic aneurysm, CKD-IV, anemia, carotid artery stenosis, history of kidney transplantation on immunosuppressants, dCHF. She presented with altered mental status.   Assessment & Plan:   Principal Problem:   Acute encephalopathy Active Problems:   Essential hypertension   History of renal transplant   Diabetes mellitus type II, controlled ([place])   Acute renal failure superimposed on stage 4 chronic kidney disease ([facility])   Chronic diastolic CHF (congestive heart failure) ([facility])   Anemia   Altered mental status   Acute encephalopathy Unknown cause. Patient has workup for MS vs other demyelinating disease vs autoimmune as an outpatient. Previously worked up on previous admission. MRI unchanged from previous. ?seizure -neurology recommendations -neuro checks  Essential hypertension -continue amlodipine, hydralazine and lasix  History of renal transplant -continue Cellcept, Tacrolimus and Prednisone  Acute on chronic kidney disease, stage III Improved with IV fluids  Diabetes mellitus, type 2 Last A1c of 6.2 -continue SSI  Chronic diastolic CHF Echocardiogram on 12/12/14 with EF of 60-65% and grade 1 diastolic dysfunction -continue Lasix  Anemia Slightly downtrended to 9.1 today -recheck CBC   DVT prophylaxis: heparin Code Status: Full code Family Communication: Daughter at bedside Disposition Plan: Discharge pending medical workup   Consultants:   Neurology  Procedures:   None  Antimicrobials:  None    Subjective: Patient reports no issues overnight. Daughter states the patient had an episode of staring this morning.   A few minutes after initial encounter, nurse called  me to the room and patient seen having right lower face twitching and would not verbally or physically respond to questions/direction and did not withdraw from noxious stimuli.  Objective: Vitals:   07/08/16 1153 07/08/16 1522 07/08/16 2118 07/09/16 0603  BP: (!) 151/97 (!) 151/89 (!) 173/92 (!) 170/95  Pulse: 87 90 94 99  Resp: 13 18 18 18   Temp: 99.7 F (37.6 C) 99.2 F (37.3 C) 99.1 F (37.3 C) 99 F (37.2 C)  TempSrc: Oral Oral Oral Oral  SpO2: 97% 100% 96% 100%  Weight:  63.6 kg (140 lb 4.8 oz)  65 kg (143 lb 3.2 oz)  Height:  5\' 5"  (1.651 m)      Intake/Output Summary (Last 24 hours) at 07/09/16 0939 Last data filed at 07/09/16 0604  Gross per 24 hour  Intake              675 ml  Output                1 ml  Net              674 ml   Filed Weights   07/08/16 0518 07/08/16 1522 07/09/16 0603  Weight: 65 kg (143 lb 4.8 oz) 63.6 kg (140 lb 4.8 oz) 65 kg (143 lb 3.2 oz)    Examination:  General exam: Appears calm and comfortable  Respiratory system: Clear to auscultation. Respiratory effort normal. Cardiovascular system: S1 & S2 heard, RRR. No murmurs, rubs, gallops or clicks. Gastrointestinal system: Abdomen is nondistended, soft and nontender. Normal bowel sounds heard. Central nervous system: Alert and oriented to person, place and year. No focal neurological deficits. Follows commands except for when asked to follow finger for EOM. EOMI, however.   On reexamination about 5 minutes  later, patient seen with right lower facial twitching and not following any commands. No withdrawal from noxious stimulus. Extremities: No edema. No calf tenderness Skin: No cyanosis. No rashes Psychiatry: Judgement and insight appear impaired. Mood & affect flat.     Data Reviewed: I have personally reviewed following labs and imaging studies  CBC:  Recent Labs Lab 07/07/16 1830 07/08/16 0450  WBC 6.7 7.5  HGB 9.8* 9.1*  HCT 30.7* 27.0*  MCV 85.8 86.8  PLT 336 572   Basic  Metabolic Panel:  Recent Labs Lab 07/04/16 1104 07/07/16 1830 07/08/16 0450  NA 134* 138 136  K 4.8 4.2 3.6  CL 100 103 104  CO2 25 26 24   GLUCOSE 163* 200* 120*  BUN 30* 30* 31*  CREATININE 2.84* 3.11* 2.83*  CALCIUM 9.2 9.2 8.9   GFR: Estimated Creatinine Clearance: 19.5 mL/min (A) (by C-G formula based on SCr of 2.83 mg/dL (H)). Liver Function Tests:  Recent Labs Lab 07/07/16 1830  AST 22  ALT 22  ALKPHOS 43  BILITOT 0.5  PROT 7.2  ALBUMIN 4.1   No results for input(s): LIPASE, AMYLASE in the last 168 hours.  Recent Labs Lab 07/08/16 0031  AMMONIA 16   Coagulation Profile: No results for input(s): INR, PROTIME in the last 168 hours. Cardiac Enzymes: No results for input(s): CKTOTAL, CKMB, CKMBINDEX, TROPONINI in the last 168 hours. BNP (last 3 results) No results for input(s): PROBNP in the last 8760 hours. HbA1C: No results for input(s): HGBA1C in the last 72 hours. CBG:  Recent Labs Lab 07/08/16 0743 07/08/16 1150 07/08/16 1703 07/08/16 2118 07/09/16 0733  GLUCAP 119* 132* 98 87 95   Lipid Profile: No results for input(s): CHOL, HDL, LDLCALC, TRIG, CHOLHDL, LDLDIRECT in the last 72 hours. Thyroid Function Tests:  Recent Labs  07/08/16 0031  TSH 1.143   Anemia Panel: No results for input(s): VITAMINB12, FOLATE, FERRITIN, TIBC, IRON, RETICCTPCT in the last 72 hours. Sepsis Labs: No results for input(s): PROCALCITON, LATICACIDVEN in the last 168 hours.  Recent Results (from the past 240 hour(s))  Urine culture     Status: Abnormal   Collection Time: 07/08/16  1:10 AM  Result Value Ref Range Status   Specimen Description URINE, RANDOM  Final   Special Requests NONE  Final   Culture MULTIPLE SPECIES PRESENT, SUGGEST RECOLLECTION (A)  Final   Report Status 07/09/2016 FINAL  Final         Radiology Studies: Ct Head Wo Contrast  Result Date: 07/07/2016 CLINICAL DATA:  Altered mental status, abnormal behavior, forgetfulness. History  of end-stage renal disease on dialysis, status post renal transplant. EXAM: CT HEAD WITHOUT CONTRAST TECHNIQUE: Contiguous axial images were obtained from the base of the skull through the vertex without intravenous contrast. COMPARISON:  MRI of the head June 18, 2016 and MRI of the head June 11, 2016 and MRI of the head May 09, 2014 FINDINGS: BRAIN: No intraparenchymal hemorrhage, mass effect nor midline shift. The ventricles and sulci are normal for age. Patchy supratentorial white matter hypodensities. No acute large vascular territory infarcts. No abnormal extra-axial fluid collections. Basal cisterns are patent. VASCULAR: Mild calcific atherosclerosis of the carotid siphons. SKULL: No skull fracture. No significant scalp soft tissue swelling. SINUSES/ORBITS: The mastoid air-cells and included paranasal sinuses are well-aerated.The included ocular globes and orbital contents are non-suspicious. OTHER: None. IMPRESSION: No acute intracranial process. Stable moderate white matter changes better characterized on recent MRI of the head. Electronically Signed   By: Sandie Ano  Bloomer M.D.   On: 07/07/2016 22:59   Mr Brain Wo Contrast  Result Date: 07/08/2016 CLINICAL DATA:  Altered mental status.  History of renal transplant. EXAM: MRI HEAD WITHOUT CONTRAST TECHNIQUE: Multiplanar, multiecho pulse sequences of the brain and surrounding structures were obtained without intravenous contrast. COMPARISON:  06/18/2016 FINDINGS: Brain: Bilateral white matter disease with periventricular rim and more patchy, hazy deep white matter hyperintensities. Signal abnormality intermittently seen in the the juxta cortical brain. There is FLAIR hyperintensity in the left pons and in the left middle cerebellar peduncle, the lateral neighboring a developmental venous anomaly. There has been no progression since prior. No asymmetric white matter disease typical of PML. Findings again could be related to chronic small vessel  ischemia, demyelinating disease (patient has oligoclonal bands), or other autoimmune process/inflammatory vasculopathy. No infarct or acutely or in the interim. No hemorrhage, hydrocephalus, or mass like findings. Vascular: Preserved flow voids. Skull and upper cervical spine: Negative Sinuses/Orbits: Negative IMPRESSION: No acute finding or change in moderate cerebral white matter disease and mild infratentorial signal abnormality. Differential diagnosis described above. Electronically Signed   By: Monte Fantasia M.D.   On: 07/08/2016 09:05        Scheduled Meds: . amLODipine  10 mg Oral Daily  . cholecalciferol  1,000 Units Oral QODAY  . escitalopram  10 mg Oral Daily  . fenofibrate  160 mg Oral Daily  . furosemide  40 mg Oral BID  . heparin  5,000 Units Subcutaneous Q8H  . hydrALAZINE  50 mg Oral TID  . insulin aspart  0-5 Units Subcutaneous QHS  . insulin aspart  0-9 Units Subcutaneous TID WC  . isosorbide mononitrate  60 mg Oral Daily  . magnesium oxide  400 mg Oral Daily  . multivitamin with minerals   Oral Daily  . mycophenolate  750 mg Oral BID  . predniSONE  5 mg Oral Q M,W,F  . sodium chloride flush  3 mL Intravenous Q12H  . tacrolimus  5 mg Oral BID   Continuous Infusions: . sodium chloride 75 mL/hr at 07/08/16 0525     LOS: 1 day     Cordelia Poche, MD [facility] 07/09/2016, 9:39 AM Pager: [phone]  If 7PM-7AM, please contact night-coverage [email or web address] Password [facility] 07/09/2016, 9:39 AM

## 2016-07-09 NOTE — Progress Notes (Signed)
EEG completed, results pending. 

## 2016-07-09 NOTE — Care Management Note (Signed)
Case Management Note  Patient Details  Name: ATHEA HALEY MRN: [id number] Date of Birth: 1956-03-14  Subjective/Objective:    renal failure               Action/Plan: Date:  July 09, 2016 Chart reviewed for concurrent status and case management needs. Will continue to follow patient progress. Discharge Planning: [facility] Expected discharge date: [id number] Velva Harman, BSN, [place], [place] Expected Discharge Date:                  Expected Discharge Plan:  [place]  In-House Referral:     Discharge planning Services  CM Consult  Post Acute Care Choice:    Choice offered to:     DME Arranged:    DME Agency:     HH Arranged:  RN, PT [place] Agency:  [facility]  Status of Service:  In process, will continue to follow  If discussed at Long Length of Stay Meetings, dates discussed:    Additional Comments:  Leeroy Cha, RN 07/09/2016, 8:56 AM

## 2016-07-09 NOTE — Procedures (Signed)
Electroencephalogram (EEG) Report  Date of study: 07/09/16  Requesting clinician: Melba Coon M.D.  Reason for study: Evaluate for seizure  Brief clinical history: This is a 61 year old woman with persistent encephalopathy, now with increased confusion and hallucinations. EEG is being performed for further evaluation.  Medications:  Current Facility-Administered Medications:  .  0.9 %  sodium chloride infusion, , Intravenous, Continuous, Ivor Costa, MD, Last Rate: 75 mL/hr at 07/08/16 0525 .  acetaminophen (TYLENOL) tablet 1,000 mg, 1,000 mg, Oral, Q6H PRN, Ivor Costa, MD .  amLODipine (NORVASC) tablet 10 mg, 10 mg, Oral, Daily, Ivor Costa, MD, 10 mg at 07/09/16 1014 .  cholecalciferol (VITAMIN D) tablet 1,000 Units, 1,000 Units, Oral, QODAY, Ivor Costa, MD, 1,000 Units at 07/08/16 1008 .  clonazePAM (KLONOPIN) tablet 1 mg, 1 mg, Oral, BID, Mariel Aloe, MD .  cyclobenzaprine (FLEXERIL) tablet 10 mg, 10 mg, Oral, TID PRN, Ivor Costa, MD .  escitalopram (LEXAPRO) tablet 10 mg, 10 mg, Oral, Daily, Ivor Costa, MD, 10 mg at 07/09/16 1013 .  fenofibrate tablet 160 mg, 160 mg, Oral, Daily, Ivor Costa, MD, 160 mg at 07/09/16 1014 .  furosemide (LASIX) tablet 40 mg, 40 mg, Oral, BID, Ivor Costa, MD, 40 mg at 07/09/16 1711 .  heparin injection 5,000 Units, 5,000 Units, Subcutaneous, Q8H, Ivor Costa, MD, Stopped at 07/09/16 1400 .  hydrALAZINE (APRESOLINE) tablet 50 mg, 50 mg, Oral, TID, Ivor Costa, MD, 50 mg at 07/09/16 1531 .  insulin aspart (novoLOG) injection 0-5 Units, 0-5 Units, Subcutaneous, QHS, Ivor Costa, MD .  insulin aspart (novoLOG) injection 0-9 Units, 0-9 Units, Subcutaneous, TID WC, Ivor Costa, MD .  isosorbide mononitrate (IMDUR) 24 hr tablet 60 mg, 60 mg, Oral, Daily, Ivor Costa, MD, 60 mg at 07/09/16 1014 .  magnesium oxide (MAG-OX) tablet 400 mg, 400 mg, Oral, Daily, Ivor Costa, MD, 400 mg at 07/09/16 0813 .  meclizine (ANTIVERT) tablet 25 mg, 25 mg, Oral, TID PRN, Ivor Costa, MD .   multivitamin with minerals tablet, , Oral, Daily, Ivor Costa, MD, 1 tablet at 07/09/16 [id number] .  mycophenolate (CELLCEPT) capsule 750 mg, 750 mg, Oral, BID, Ivor Costa, MD, 750 mg at 07/09/16 1014 .  nitroGLYCERIN (NITROSTAT) SL tablet 0.4 mg, 0.4 mg, Sublingual, Q5 min PRN, Ivor Costa, MD .  ondansetron [facility]) tablet 4 mg, 4 mg, Oral, Q6H PRN **OR** ondansetron (ZOFRAN) injection 4 mg, 4 mg, Intravenous, Q6H PRN, Ivor Costa, MD .  predniSONE (DELTASONE) tablet 5 mg, 5 mg, Oral, Q M,W,F, Ivor Costa, MD, 5 mg at 07/09/16 0813 .  sodium chloride flush (NS) 0.9 % injection 3 mL, 3 mL, Intravenous, Q12H, Ivor Costa, MD, 3 mL at 07/08/16 2251 .  tacrolimus (PROGRAF) capsule 5 mg, 5 mg, Oral, BID, Ivor Costa, MD, 5 mg at 07/09/16 1013  Description: This is a routine EEG performed using standard international 10-20 electrode placement. A total of 18 channels are recorded, including one for the EKG. The patient is awake and drowsy during this recording.   Activating Maneuvers: None  Findings:  The EKG channel demonstrates a regular rhythm with a rate of about 100 beats per minute.   The background consists primarily of alpha activity. The best dominant posterior rhythm is 8-9 hertz. This is symmetric and reacts as expected with eye opening. There is a mild to moderate degree of intermixed slowing that consists of a mixture of theta and delta frequencies. Also noted is prominent high-frequency artifact and moderate muscle artifact.  There are no  focal asymmetries. No epileptiform discharges are present. No seizures are recorded.   Drowsiness is recorded and is normal in appearance.    Impression:  This EEG is mildly abnormal due to mild to moderate degree of intermixed slowing. No epileptiform are seen.  Clinical correlation:  This EEG pattern is suggestive of a mild global encephalopathic process, nonspecific as to etiology. Potential considerations would include medication effect, underlying  neurodegenerative process, metabolic dysfunction, and hypoxic-ischemic brain injury. Clinical correlation is recommended.   Melba Coon, MD [facility]

## 2016-07-09 NOTE — Progress Notes (Signed)
RN was notified by CCMD about patient having some Big[place] pvc's. Patient in no acute distress. Will continue to monitor. MD notified.

## 2016-07-09 NOTE — Telephone Encounter (Signed)
The patient is back in the hospital with anxiety issues. The patient will undergo lumbar puncture to look for JC virus antibody, she will also have her MRI of the cervical spine was in the hospital.

## 2016-07-09 NOTE — Procedures (Signed)
Indication:  Look for JC virus  Risks of the procedure were dicussed with the patient including post-LP headache, bleeding, infection, weakness/numbness of legs(radiculopathy), death.  The patient's  Daughter proxy agreed and written consent was obtained.   The patient was prepped and draped, and using sterile technique a 20 gauge quinke spinal needle was inserted in the L4/5 space. Unable to obtain fluid. Question if patient is dry. Will need to be done under fluoroscopy.    Etta Quill PA-C [facility] [phone]  M-F  (8:30 am- 4 PM)  07/09/2016, 3:08 PM

## 2016-07-10 ENCOUNTER — Inpatient Hospital Stay (HOSPITAL_COMMUNITY): Payer: Medicare Other

## 2016-07-10 DIAGNOSIS — E119 Type 2 diabetes mellitus without complications: Secondary | ICD-10-CM | POA: Diagnosis not present

## 2016-07-10 DIAGNOSIS — D649 Anemia, unspecified: Secondary | ICD-10-CM | POA: Diagnosis not present

## 2016-07-10 DIAGNOSIS — G9340 Encephalopathy, unspecified: Secondary | ICD-10-CM

## 2016-07-10 DIAGNOSIS — G35 Multiple sclerosis: Secondary | ICD-10-CM

## 2016-07-10 DIAGNOSIS — I5032 Chronic diastolic (congestive) heart failure: Secondary | ICD-10-CM

## 2016-07-10 DIAGNOSIS — I10 Essential (primary) hypertension: Secondary | ICD-10-CM | POA: Diagnosis not present

## 2016-07-10 LAB — CBC
HEMATOCRIT: 29 % — AB (ref 36.0–46.0)
Hemoglobin: 9.5 g/dL — ABNORMAL LOW (ref 12.0–15.0)
MCH: 28.6 pg (ref 26.0–34.0)
MCHC: 32.8 g/dL (ref 30.0–36.0)
MCV: 87.3 fL (ref 78.0–100.0)
PLATELETS: 247 10*3/uL (ref 150–400)
RBC: 3.32 MIL/uL — ABNORMAL LOW (ref 3.87–5.11)
RDW: 14.4 % (ref 11.5–15.5)
WBC: 6.5 10*3/uL (ref 4.0–10.5)

## 2016-07-10 LAB — GLUCOSE, CAPILLARY
Glucose-Capillary: 94 mg/dL (ref 65–99)
Glucose-Capillary: 110 mg/dL — ABNORMAL HIGH (ref 65–99)
Glucose-Capillary: 105 mg/dL — ABNORMAL HIGH (ref 65–99)

## 2016-07-10 LAB — BASIC METABOLIC PANEL
Anion gap: 13 (ref 5–15)
BUN: 26 mg/dL — ABNORMAL HIGH (ref 6–20)
CALCIUM: 8.4 mg/dL — AB (ref 8.9–10.3)
CO2: 21 mmol/L — AB (ref 22–32)
CREATININE: 2.65 mg/dL — AB (ref 0.44–1.00)
Chloride: 105 mmol/L (ref 101–111)
GFR, EST AFRICAN AMERICAN: 22 mL/min — AB (ref >60)
GFR, EST NON AFRICAN AMERICAN: 19 mL/min — AB (ref >60)
GLUCOSE: 97 mg/dL (ref 65–99)
Potassium: 3.1 mmol/L — ABNORMAL LOW (ref 3.5–5.1)
Sodium: 139 mmol/L (ref 135–145)

## 2016-07-10 LAB — CSF CELL COUNT WITH DIFFERENTIAL
Lymphs, CSF: 90 % — ABNORMAL HIGH (ref 40–80)
Monocyte-Macrophage-Spinal Fluid: 3 % — ABNORMAL LOW (ref 15–45)
RBC COUNT CSF: 1685 /mm3 — AB
SEGMENTED NEUTROPHILS-CSF: 7 % — AB (ref 0–6)
Tube #: 1
WBC CSF: 26 /mm3 — AB (ref 0–5)

## 2016-07-10 LAB — GLUCOSE, CSF: Glucose, CSF: 54 mg/dL (ref 40–70)

## 2016-07-10 LAB — PROTEIN, CSF: Total  Protein, CSF: 64 mg/dL — ABNORMAL HIGH (ref 15–45)

## 2016-07-10 MED ORDER — LORAZEPAM 2 MG/ML IJ SOLN
1.0000 mg | Freq: Once | INTRAMUSCULAR | Status: AC
  Administered 2016-07-10: 1 mg via INTRAVENOUS

## 2016-07-10 MED ORDER — LORAZEPAM 0.5 MG PO TABS
0.5000 mg | ORAL_TABLET | Freq: Four times a day (QID) | ORAL | Status: DC | PRN
  Administered 2016-07-12: 0.5 mg via ORAL
  Filled 2016-07-10: qty 1

## 2016-07-10 MED ORDER — HYDRALAZINE HCL 20 MG/ML IJ SOLN
5.0000 mg | Freq: Three times a day (TID) | INTRAMUSCULAR | Status: DC | PRN
  Administered 2016-07-12 – 2016-07-13 (×2): 5 mg via INTRAVENOUS
  Filled 2016-07-10 (×2): qty 1

## 2016-07-10 MED ORDER — LORAZEPAM 2 MG/ML IJ SOLN: 2.0000 mg | Freq: Once | INTRAMUSCULAR | Status: DC

## 2016-07-10 MED ORDER — LORAZEPAM 2 MG/ML IJ SOLN
INTRAMUSCULAR | Status: AC
Start: 2016-07-10 — End: 2016-07-10
  Administered 2016-07-10: 1 mg via INTRAVENOUS
  Filled 2016-07-10: qty 1

## 2016-07-10 MED ORDER — LIDOCAINE HCL 1 % IJ SOLN
INTRAMUSCULAR | Status: AC
  Filled 2016-07-10: qty 20

## 2016-07-10 MED ORDER — POTASSIUM CHLORIDE CRYS ER 20 MEQ PO TBCR
40.0000 meq | EXTENDED_RELEASE_TABLET | Freq: Once | ORAL | Status: AC
  Administered 2016-07-10: 40 meq via ORAL
  Filled 2016-07-10: qty 2

## 2016-07-10 MED ORDER — LORAZEPAM 2 MG/ML IJ SOLN
0.5000 mg | Freq: Four times a day (QID) | INTRAMUSCULAR | Status: DC | PRN
  Administered 2016-07-10 – 2016-07-12 (×3): 0.5 mg via INTRAMUSCULAR
  Filled 2016-07-10 (×3): qty 1

## 2016-07-10 NOTE — Procedures (Signed)
CLINICAL DATA: [Altered mental status with hallucinations and anxiety.] EXAM: DIAGNOSTIC LUMBAR PUNCTURE UNDER FLUOROSCOPIC GUIDANCE FLUOROSCOPY TIME: Fluoroscopy Time: [0 minutes, 34 seconds] Radiation Exposure Index (if provided by the fluoroscopic device): [5.4 mGy] Number of Acquired Spot Images: [0] PROCEDURE: I discussed the risks (including hemorrhage, infection, headache, and nerve damage, among others), benefits, and alternatives to fluoroscopically guided lumbar puncture with the patient and her daughter. The patient's daughter is currently making medical decisions for her given the patient's altered mental status.  We specifically discussed the high technical likelihood of success of the procedure. The patient and her daughter understood and elected for the patient to undergo the procedure.   Standard time-out was employed. Following sterile skin prep and local anesthetic administration consisting of 1 percent lidocaine, a 22 gauge spinal needle was advanced without difficulty into the thecal sac at the at the [L3-4] level. Clear CSF was returned.  My initial attempt to turn the patient on her side to obtain opening pressure indicated that due to the patient's altered mental status this would be risky. Specifically, I was not confident that the patient would be able to turn her shoulders and hips together, and with the needle in her back I was not comfortable with her twisting her body.  Accordingly, I obtain the opening pressure measurement with patient prone.  Opening pressure was measured at 29 cm of water.    12 cc of clear CSF was collected.  A closing pressure was performed (again, in the prone position) and measured 16 cm of water.    The needle was subsequently removed and the skin cleansed and bandaged. No immediate complications were observed.    IMPRESSION: [ 1. Technically successful fluoroscopically guided lumbar puncture at the L3-4 level yielding 12 cc of  clear CSF.  Due to patient limitations, I was not able to turn the patient in the lateral decubitus position for pressure measurements. This can introduce some inaccuracy.  Opening pressure was measured in the prone position at 29 cm water and closing pressure at 16 cm of water.  ]

## 2016-07-10 NOTE — Significant Event (Signed)
Rapid Response Event Note  Overview:      Initial Focused Assessment:   Interventions:  Plan of Care (if not transferred): MD at bedside, orders received. Ativan 1mg  given, will continue to monitor RRT RN to be called if need further.  Event Summary:   at   RRT RN called by Bedside RN to 1503 for patient anxiety. Upon arrival, patient supine in bed with repetitive speech and starring up to ceiling. Patient appeared to be intensely shaking. Vital signs SBP 187/11 HR 115 RR 40 Sats 100% on 2 Liters. RRT RN was able to perform neuro assessment. Eyebrow lifts and tongue and smile symmetrical movement, grips equal, and able to lift both legs off bed. MD [name] [name] in room at this time. Orders for Ativan 1mg  received and given. Patient more alert and returned back to base line. However, patient remains anxious and shaky. Vital Signs SBG 173/113 HR 110 RR 22 Sats 100% on [place] 2Liters. Informed Beside RN to call RRT RN if further needed.    at          [name] [name]

## 2016-07-10 NOTE — Progress Notes (Signed)
CRITICAL VALUE ALERT  Critical value received:  WBC CSF 26  Date of notification:  07/10/16  Time of notification:  1130  Critical value read back:Yes.    Nurse who received alert:  Sandie Ano  MD notified (1st page):  Dr. Georgena Spurling.  Time of first page:  71  MD notified (2nd page):  Time of second page:  Responding MD:  Dr. Drucie Opitz  Time MD responded:  Notified via text

## 2016-07-10 NOTE — Progress Notes (Signed)
Patient had episode of anxiety, had continual, repetitive words.Patient BP was elevated at 180 /99, RR up to 43, was placed on O2 at 2 L/ [place].Rapid response nurse and Dr. Lonny Prude arrived  and evaluated the patient. Order for Ativan 1 mg IV once administered. BP was cont. checked. Neuro checks done by RRT RN. After Ativan,patient got back to baseline. BP decreased to 159/96,also patient was able to take oral hydralazine 50 mg. Patient calm at this time. Family at bedside. Will continue to monitor.

## 2016-07-10 NOTE — Progress Notes (Signed)
Patient will go to MRI,kept flat in bed. Badaaid to back clean , dry and intact. Will continue to monitor.

## 2016-07-10 NOTE — Progress Notes (Signed)
PROGRESS NOTE    [name] [name]  GXQ:[id number] DOB: May 27, 1955 DOA: 07/07/2016 PCP: Annye Asa, MD   Brief Narrative: [name] [name] is a 61 y.o. female with medical history significant of hypertension, hyperlipidemia, diabetes mellitus, GERD, depression, anxiety, thoracic aortic aneurysm, CKD-IV, anemia, carotid artery stenosis, history of kidney transplantation on immunosuppressants, dCHF. She presented with altered mental status.   Assessment & Plan:   Principal Problem:   Acute encephalopathy Active Problems:   Essential hypertension   History of renal transplant   Diabetes mellitus type II, controlled ([place])   Acute renal failure superimposed on stage 4 chronic kidney disease (HCC)   Chronic diastolic CHF (congestive heart failure) (HCC)   Anemia   Altered mental status   Acute encephalopathy Unknown cause. Patient has workup for MS vs other demyelinating disease vs autoimmune as an outpatient. Previously worked up on previous admission. MRI unchanged from previous. ?seizure. EEG not suggestive of a seizure, but showed slowing. MRI cervical spine significant for nothing new that would contribute to symptoms. CSF fluid traumatic, studies pending. -neurology recommendations -neuro checks  Essential hypertension -continue amlodipine, hydralazine and lasix  History of renal transplant -continue Cellcept, Tacrolimus and Prednisone  Acute on chronic kidney disease, stage III Improved with IV fluids  Diabetes mellitus, type 2 Last A1c of 6.2 -continue SSI  Chronic diastolic CHF Echocardiogram on 12/12/14 with EF of 60-65% and grade 1 diastolic dysfunction -continue Lasix  Anemia Stable. No bleeding.   DVT prophylaxis: heparin Code Status: Full code Family Communication: son at bedside Disposition Plan: Discharge pending medical workup   Consultants:   Neurology  Procedures:   None  Antimicrobials:  None    Subjective: Patient reports no  issues overnight. No complaints. When asked about her unresponsiveness episode yesterday, she states she remembers it but does not know what happened.  Objective: Vitals:   07/10/16 0500 07/10/16 0943 07/10/16 0952 07/10/16 1300  BP: (!) 176/102 (!) 151/106 (!) 151/106 (!) 139/93  Pulse: 90 88  92  Resp: 16 18  18   Temp: (!) 100.7 F (38.2 C) 100.1 F (37.8 C)  99 F (37.2 C)  TempSrc: Oral Oral  Oral  SpO2: 98% 98%  100%  Weight:      Height:        Intake/Output Summary (Last 24 hours) at 07/10/16 1450 Last data filed at 07/09/16 1916  Gross per 24 hour  Intake               60 ml  Output                0 ml  Net               60 ml   Filed Weights   07/08/16 0518 07/08/16 1522 07/09/16 0603  Weight: 65 kg (143 lb 4.8 oz) 63.6 kg (140 lb 4.8 oz) 65 kg (143 lb 3.2 oz)    Examination:  General exam: Appears calm and comfortable  Respiratory system: Clear to auscultation. Respiratory effort normal. Cardiovascular system: S1 & S2 heard, RRR. No murmurs, rubs, gallops or clicks. Gastrointestinal system: Abdomen is nondistended, soft and nontender. Normal bowel sounds heard. Central nervous system: Alert and oriented to person, place and year. No focal neurological deficits. Follows commands except for when asked to follow finger for EOM and finger to nose. Extremities: No edema. No calf tenderness Skin: No cyanosis. No rashes Psychiatry: Judgement and insight appear impaired. Mood & affect flat and anxious  Data Reviewed: I have personally reviewed following labs and imaging studies  CBC:  Recent Labs Lab 07/07/16 1830 07/08/16 0450 07/10/16 0615  WBC 6.7 7.5 6.5  HGB 9.8* 9.1* 9.5*  HCT 30.7* 27.0* 29.0*  MCV 85.8 86.8 87.3  PLT 336 272 381   Basic Metabolic Panel:  Recent Labs Lab 07/04/16 1104 07/07/16 1830 07/08/16 0450 07/10/16 0615  NA 134* 138 136 139  K 4.8 4.2 3.6 3.1*  CL 100 103 104 105  CO2 25 26 24  21*  GLUCOSE 163* 200* 120* 97  BUN  30* 30* 31* 26*  CREATININE 2.84* 3.11* 2.83* 2.65*  CALCIUM 9.2 9.2 8.9 8.4*   GFR: Estimated Creatinine Clearance: 20.8 mL/min (A) (by C-G formula based on SCr of 2.65 mg/dL (H)). Liver Function Tests:  Recent Labs Lab 07/07/16 1830  AST 22  ALT 22  ALKPHOS 43  BILITOT 0.5  PROT 7.2  ALBUMIN 4.1   No results for input(s): LIPASE, AMYLASE in the last 168 hours.  Recent Labs Lab 07/08/16 0031  AMMONIA 16   Coagulation Profile: No results for input(s): INR, PROTIME in the last 168 hours. Cardiac Enzymes: No results for input(s): CKTOTAL, CKMB, CKMBINDEX, TROPONINI in the last 168 hours. BNP (last 3 results) No results for input(s): PROBNP in the last 8760 hours. HbA1C: No results for input(s): HGBA1C in the last 72 hours. CBG:  Recent Labs Lab 07/09/16 1157 07/09/16 1653 07/09/16 2108 07/10/16 0734 07/10/16 1138  GLUCAP 116* 106* 121* 94 110*   Lipid Profile: No results for input(s): CHOL, HDL, LDLCALC, TRIG, CHOLHDL, LDLDIRECT in the last 72 hours. Thyroid Function Tests:  Recent Labs  07/08/16 0031  TSH 1.143   Anemia Panel: No results for input(s): VITAMINB12, FOLATE, FERRITIN, TIBC, IRON, RETICCTPCT in the last 72 hours. Sepsis Labs: No results for input(s): PROCALCITON, LATICACIDVEN in the last 168 hours.  Recent Results (from the past 240 hour(s))  Urine culture     Status: Abnormal   Collection Time: 07/08/16  1:10 AM  Result Value Ref Range Status   Specimen Description URINE, RANDOM  Final   Special Requests NONE  Final   Culture MULTIPLE SPECIES PRESENT, SUGGEST RECOLLECTION (A)  Final   Report Status 07/09/2016 FINAL  Final         Radiology Studies: Mr Cervical Spine Wo Contrast  Result Date: 07/10/2016 CLINICAL DATA:  Confusion and hallucinations. History of multiple sclerosis. Elevated GFR. EXAM: MRI CERVICAL SPINE WITHOUT CONTRAST TECHNIQUE: Multiplanar, multisequence MR imaging of the cervical spine was performed. No  intravenous contrast was administered. COMPARISON:  MRI brain from 07/08/2016 FINDINGS: Despite efforts by the technologist and patient, motion artifact is present on today's exam and could not be eliminated. This reduces exam sensitivity and specificity. Alignment: No vertebral subluxation is observed. Vertebrae: No significant vertebral marrow edema is identified. Cord: There is some subtle cord heterogeneity on the sagittal T2 weighted images particularly around the C6 level, but the axial images do not confirm this and a discrete cord lesion is not definitively seen. I suspect that some of this very vague heterogeneity is due to motion artifact. Expected pulsation artifact in the regional CSF. Posterior Fossa, vertebral arteries, paraspinal tissues: Subtle T2 accentuation in the left cerebellar white matter adjacent to a known small venous anomaly as shown on prior brain MRI, unchanged. Disc levels: C2-3: Unremarkable. C3-4:  Unremarkable. C4-5:  Unremarkable. C5-6:  Unremarkable. C6-7:  No impingement.  Small right paracentral disc protrusion. C7-T1:  Unremarkable. IMPRESSION: 1.  No definite cord lesion is identified. Vague heterogeneity in the cervical cord around the C6 level is most attributable to the underlying motion artifact. 2. No impingement in the cervical spine although there is a small right paracentral disc protrusion at C6-7. 3. Subtle T2 accentuation in the left cerebellar white matter as described on prior MRI. Electronically Signed   By: Van Clines M.D.   On: 07/10/2016 10:46   Dg Fluoro Guide Lumbar Puncture  Result Date: 07/10/2016 CLINICAL DATA:  Altered mental status with hallucinations and anxiety. EXAM: DIAGNOSTIC LUMBAR PUNCTURE UNDER FLUOROSCOPIC GUIDANCE FLUOROSCOPY TIME:  Fluoroscopy Time:  0 minutes, 34 seconds Radiation Exposure Index (if provided by the fluoroscopic device): 5.4 mGy Number of Acquired Spot Images: 0 PROCEDURE: I discussed the risks (including  hemorrhage, infection, headache, and nerve damage, among others), benefits, and alternatives to fluoroscopically guided lumbar puncture with the patient and her daughter. The patient's daughter is currently making medical decisions for her given the patient's altered mental status. We specifically discussed the high technical likelihood of success of the procedure. The patient and her daughter understood and elected for the patient to undergo the procedure. Standard time-out was employed. Following sterile skin prep and local anesthetic administration consisting of 1 percent lidocaine, a 22 gauge spinal needle was advanced without difficulty into the thecal sac at the at the L3-4 level. Clear CSF was returned. My initial attempt to turn the patient on her side to obtain opening pressure indicated that due to the patient's altered mental status this would be risky. Specifically, I was not confident that the patient would be able to turn her shoulders and hips together, and with the needle in her back I was not comfortable with her twisting her body. Accordingly, I obtain the opening pressure measurement with patient prone. Opening pressure was measured at 29 cm of water. 12 cc of clear CSF was collected. A closing pressure was performed (again, in the prone position) and measured 16 cm of water. The needle was subsequently removed and the skin cleansed and bandaged. No immediate complications were observed. IMPRESSION: 1. Technically successful fluoroscopically guided lumbar puncture at the L3-4 level yielding 12 cc of clear CSF. Due to patient limitations, I was not able to turn the patient in the lateral decubitus position for pressure measurements. This can introduce some inaccuracy. Opening pressure was measured in the prone position at 29 cm water and closing pressure at 16 cm of water. Electronically Signed   By: Van Clines M.D.   On: 07/10/2016 09:56        Scheduled Meds: . amLODipine  10 mg  Oral Daily  . cholecalciferol  1,000 Units Oral QODAY  . clonazePAM  1 mg Oral BID  . escitalopram  10 mg Oral Daily  . fenofibrate  160 mg Oral Daily  . furosemide  40 mg Oral BID  . heparin  5,000 Units Subcutaneous Q8H  . hydrALAZINE  50 mg Oral TID  . insulin aspart  0-5 Units Subcutaneous QHS  . insulin aspart  0-9 Units Subcutaneous TID WC  . isosorbide mononitrate  60 mg Oral Daily  . lidocaine      . magnesium oxide  400 mg Oral Daily  . multivitamin with minerals   Oral Daily  . mycophenolate  750 mg Oral BID  . predniSONE  5 mg Oral Q M,W,F  . sodium chloride flush  3 mL Intravenous Q12H  . tacrolimus  5 mg Oral BID   Continuous Infusions: .  sodium chloride 1,000 mL (07/10/16 1359)     LOS: 2 days     Cordelia Poche, MD [facility] 07/10/2016, 2:50 PM Pager: [phone]  If 7PM-7AM, please contact night-coverage [email or web address] Password [email or web address] 07/10/2016, 2:50 PM

## 2016-07-11 DIAGNOSIS — R4182 Altered mental status, unspecified: Secondary | ICD-10-CM

## 2016-07-11 LAB — GLUCOSE, CAPILLARY
GLUCOSE-CAPILLARY: 90 mg/dL (ref 65–99)
Glucose-Capillary: 138 mg/dL — ABNORMAL HIGH (ref 65–99)
Glucose-Capillary: 128 mg/dL — ABNORMAL HIGH (ref 65–99)
Glucose-Capillary: 120 mg/dL — ABNORMAL HIGH (ref 65–99)

## 2016-07-11 LAB — BASIC METABOLIC PANEL
Anion gap: 7 (ref 5–15)
BUN: 21 mg/dL — AB (ref 6–20)
CHLORIDE: 109 mmol/L (ref 101–111)
CO2: 24 mmol/L (ref 22–32)
Calcium: 8.6 mg/dL — ABNORMAL LOW (ref 8.9–10.3)
Creatinine, Ser: 2.6 mg/dL — ABNORMAL HIGH (ref 0.44–1.00)
GFR calc Af Amer: 22 mL/min — ABNORMAL LOW (ref >60)
GFR calc non Af Amer: 19 mL/min — ABNORMAL LOW (ref >60)
GLUCOSE: 198 mg/dL — AB (ref 65–99)
POTASSIUM: 3.1 mmol/L — AB (ref 3.5–5.1)
Sodium: 140 mmol/L (ref 135–145)

## 2016-07-11 MED ORDER — POTASSIUM CHLORIDE CRYS ER 20 MEQ PO TBCR
40.0000 meq | EXTENDED_RELEASE_TABLET | Freq: Two times a day (BID) | ORAL | Status: AC
  Administered 2016-07-11 (×2): 40 meq via ORAL
  Filled 2016-07-11 (×2): qty 2

## 2016-07-11 NOTE — Consult Note (Signed)
[facility] Consult   Reason for Consult:  Mental status, dizziness and generalized weakness Referring Physician:  Dr. Lonny Prude Patient Identification: [name] [name] MRN:  [id number] Principal Diagnosis: Acute encephalopathy Diagnosis:   Patient Active Problem List   Diagnosis Date Noted  . Acute encephalopathy [G93.40] 07/08/2016  . Acute renal failure superimposed on stage 4 chronic kidney disease ([place]) [N17.9, N18.4] 07/08/2016  . Chronic diastolic CHF (congestive heart failure) ([place]) [I50.32] 07/08/2016  . Anemia [D64.9] 07/08/2016  . Altered mental status [R41.82] 07/08/2016  . Abnormal brain MRI [R90.89] 07/03/2016  . Fasciculation [R25.3] 06/18/2016  . Toxic encephalopathy [G92] 06/18/2016  . Dizziness and giddiness [R42] 04/29/2016  . Carotid artery disease ([place]) [I77.9]   . Physical exam [Z00.00] 01/28/2016  . Hyperlipidemia [E78.5] 06/28/2015  . Diabetes mellitus type II, controlled ([place]) [E11.9] 06/28/2015  . Grief [F43.20] 06/28/2015  . Thoracic ascending aortic aneurysm (41 mm on Echo 06/2013) [I71.2] 07/06/2013  . ESRD (end stage renal disease) ([place]) [N18.6] 06/16/2013  . History of renal transplant [Z94.0] 06/16/2013  . GERD [K21.9] 08/13/2009  . OTH&UNSPEC NONINFECTIOUS GASTROENTERITIS&COLITIS [K52.89] 08/13/2009  . Essential hypertension [I10] 04/25/2009  . HEMORRHOIDS-INTERNAL [K64.8] 04/25/2009  . Chronic kidney disease [N18.9] 04/25/2009  . WEIGHT LOSS [R63.4] 04/25/2009  . NAUSEA AND VOMITING [R11.2] 04/25/2009  . NAUSEA ALONE [R11.0] 04/25/2009  . PERSONAL HX COLONIC POLYPS [Z86.010] 04/25/2009    Total Time spent with patient: 1 hour  Subjective:   [name] [name] is a 61 y.o. female patient admitted with AMS.  HPI:  [name] [name] is a 61 y.o. female with medical history significant of hypertension, hyperlipidemia, diabetes mellitus, GERD, depression, anxiety, thoracic aortic aneurysm, CKD-IV, anemia, carotid artery stenosis,  history of kidney transplantation on immunosuppressants, dCHF, who presents AMS. Psychiatric consultation called in for possible medication needs or adjustments due to increased anxiety and recently seen by a local psychiatrist but did not make any changes. Case discussed with patient, patient daughter who is at bed side and [facility] hospitalist. Patient reported this is a second medical admission within a month time and has extensive work up done for the altered mental status with few abnormal findings in her CSF, which suggestive of possible MS or demyelinating disorder as per neurology. Patient stated that she has been suffering with anxiety over two years and received clonazepam 1 mg twice a day from PCP and her initial admission thought due to medication related withdrawal symptoms and this time she continue to have confusion, blurry vision, dizziness and generalized weakness instead of taking benzodiazepines. She has been working until January of this year to [employer] work and does not no specific exposure to chemicals.  Patient is a poor historian and having hard time to express her thoughts due to stuttering and difficulty finding words and misspelling initially and than correcting herself. Reviewed neurology work up.   As per review of lab/procedure results : CT head is negative for acute intracranial abnormalities, This EEG pattern is suggestive of a mild global encephalopathic process, nonspecific as to etiology. Potential considerations would include medication effect, underlying neurodegenerative process, metabolic dysfunction, and hypoxic-ischemic brain injury. Clinical correlation is recommended. Patient is placed on telemetry bed for observation.   Neurology, Dr. Nicole Kindred was consulted who recommended MRI for brain  Past Psychiatric History: Anxiety disorder and has this seen only once,  Dr Sherlynn Carbon medication changes made.  Risk to Self: Is patient at risk for suicide?:  No Risk to Others:   Prior  Inpatient Therapy:   Prior Outpatient Therapy:    Past Medical History:  Past Medical History:  Diagnosis Date  . Adenomatous colon polyp 04/1998  . Anemia   . Carotid artery disease ([place])    Carotid US 1/18: bilateral ICA 1-39, R thyroid lobe nodule (1.9x2.2x3cm); numerous L thyroid lobe nodules - repeat 1 year  . Chronic kidney disease   . Chronic renal failure    post transplant  . Diabetes mellitus   . Esophagitis    Grade 1 Distal  . GERD (gastroesophageal reflux disease)   . Hemorrhoids   . Hx of cardiovascular stress test    Lexiscan Myoview (06/2013):  No ischemia, EF 66%; normal.  //  Myoview 12/17: EF 62, no ischemia or scar; Normal  . Hx of echocardiogram    a. Echocardiogram (06/2013):  Mod focal basal hypertrophy, EF 60-65%, normal wall motion, Gr 1 DD, mild AI, mildly dilated ascending aorta (41 mm), mild LAE.; b.  Echo 9/16: mod LVH, EF 60-65%, no RWMA, Gr 1 DD, trivial AI, mild dilated ascending aorta, mild LAE  . Hyperkalemia   . Hyperlipidemia   . Hypertension   . Metabolic acidosis   . Pneumonia   . Seizures ([place])     Past Surgical History:  Procedure Laterality Date  . ABDOMINAL HYSTERECTOMY    . AV FISTULA PLACEMENT  07/04/2005   Cimino AV fistula  . AV FISTULA PLACEMENT  08/27/2005  . AV FISTULA PLACEMENT W/ PTFE  08/27/2005  . CESAREAN SECTION    . DG AV DIALYSIS GRAFT DECLOT OR  07/24/2005   AV Gore-Tex graf  . DG AV DIALYSIS GRAFT DECLOT OR  Thrombosis right forearm, loop arteriovenous   Thrombosis right forearm, loop arteriovenous graft  . KIDNEY TRANSPLANT  2009   Both  . THROMBECTOMY / ARTERIOVENOUS GRAFT REVISION  10/12/2006  . THROMBECTOMY / ARTERIOVENOUS GRAFT REVISION  10/16/2006   Family History:  Family History  Problem Relation Age of Onset  . Kidney disease Paternal Aunt   . Heart disease Mother   . Heart disease Father   . Colon cancer Neg Hx    Family Psychiatric  History: None reported. She has two  children, one daughter and one son.  Social History:  History  Alcohol Use No     History  Drug Use No    Social History   Social History  . Marital status: Widowed    Spouse name: N/A  . Number of children: 2  . Years of education: 12   Occupational History  . [employer]     Social History Main Topics  . Smoking status: Never Smoker  . Smokeless tobacco: Never Used  . Alcohol use No  . Drug use: No  . Sexual activity: No   Other Topics Concern  . None   Social History Narrative   Denies caffeine use    Additional Social History:    Allergies:   Allergies  Allergen Reactions  . Lisinopril Shortness Of Breath, Swelling and Other (See Comments)    Throat irritation also. Patient takes losartan and tolerates fine.  [name] [Oxaprozin] Dermatitis and Other (See Comments)    hives    Labs:  Results for orders placed or performed during the hospital encounter of 07/07/16 (from the past 48 hour(s))  Glucose, capillary     Status: Abnormal   Collection Time: 07/09/16  4:53 PM  Result Value Ref Range   Glucose-Capillary 106 (H) 65 - 99 mg/dL  Glucose, capillary     Status: Abnormal   Collection Time: 07/09/16  9:08 PM  Result Value Ref Range   Glucose-Capillary 121 (H) 65 - 99 mg/dL  CBC     Status: Abnormal   Collection Time: 07/10/16  6:15 AM  Result Value Ref Range   WBC 6.5 4.0 - 10.5 K/uL   RBC 3.32 (L) 3.87 - 5.11 MIL/uL   Hemoglobin 9.5 (L) 12.0 - 15.0 g/dL   HCT 29.0 (L) 36.0 - 46.0 %   MCV 87.3 78.0 - 100.0 fL   MCH 28.6 26.0 - 34.0 pg   MCHC 32.8 30.0 - 36.0 g/dL   RDW 14.4 11.5 - 15.5 %   Platelets 247 150 - 400 K/uL  Basic metabolic panel     Status: Abnormal   Collection Time: 07/10/16  6:15 AM  Result Value Ref Range   Sodium 139 135 - 145 mmol/L   Potassium 3.1 (L) 3.5 - 5.1 mmol/L   Chloride 105 101 - 111 mmol/L   CO2 21 (L) 22 - 32 mmol/L   Glucose, Bld 97 65 - 99 mg/dL   BUN 26 (H) 6 - 20 mg/dL   Creatinine, Ser 2.65 (H) 0.44 -  1.00 mg/dL   Calcium 8.4 (L) 8.9 - 10.3 mg/dL   GFR calc non Af Amer 19 (L) >60 mL/min   GFR calc Af Amer 22 (L) >60 mL/min    Comment: (NOTE) The eGFR has been calculated using the CKD EPI equation. This calculation has not been validated in all clinical situations. eGFR's persistently <60 mL/min signify possible Chronic Kidney Disease.    Anion gap 13 5 - 15  Glucose, capillary     Status: None   Collection Time: 07/10/16  7:34 AM  Result Value Ref Range   Glucose-Capillary 94 65 - 99 mg/dL  Glucose, CSF     Status: None   Collection Time: 07/10/16  9:10 AM  Result Value Ref Range   Glucose, CSF 54 40 - 70 mg/dL  Protein, CSF     Status: Abnormal   Collection Time: 07/10/16  9:10 AM  Result Value Ref Range   Total  Protein, CSF 64 (H) 15 - 45 mg/dL  CSF cell count with differential     Status: Abnormal   Collection Time: 07/10/16  9:10 AM  Result Value Ref Range   Tube # 1    Color, CSF STRAW (A) COLORLESS   Appearance, CSF CLEAR (A) CLEAR   Supernatant NOT INDICATED    RBC Count, CSF 1,685 (H) 0 /cu mm   WBC, CSF 26 (HH) 0 - 5 /cu mm    Comment: CRITICAL RESULT CALLED TO, READ BACK BY AND VERIFIED WITH: ALDIAR,D. RN '@[id number]'  ON 4.19.18 BY NMCCOY    Segmented Neutrophils-CSF 7 (H) 0 - 6 %   Lymphs, CSF 90 (H) 40 - 80 %   Monocyte-Macrophage-Spinal Fluid 3 (L) 15 - 45 %   Other Cells, CSF CORRELATE WITH CYTOLOGY.   Glucose, capillary     Status: Abnormal   Collection Time: 07/10/16 11:38 AM  Result Value Ref Range   Glucose-Capillary 110 (H) 65 - 99 mg/dL  Glucose, capillary     Status: Abnormal   Collection Time: 07/10/16  4:21 PM  Result Value Ref Range   Glucose-Capillary 105 (H) 65 - 99 mg/dL  Glucose, capillary     Status: None   Collection Time: 07/11/16  7:26 AM  Result Value Ref Range   Glucose-Capillary 90  65 - 99 mg/dL  Basic metabolic panel     Status: Abnormal   Collection Time: 07/11/16  9:43 AM  Result Value Ref Range   Sodium 140 135 - 145 mmol/L    Potassium 3.1 (L) 3.5 - 5.1 mmol/L   Chloride 109 101 - 111 mmol/L   CO2 24 22 - 32 mmol/L   Glucose, Bld 198 (H) 65 - 99 mg/dL   BUN 21 (H) 6 - 20 mg/dL   Creatinine, Ser 2.60 (H) 0.44 - 1.00 mg/dL   Calcium 8.6 (L) 8.9 - 10.3 mg/dL   GFR calc non Af Amer 19 (L) >60 mL/min   GFR calc Af Amer 22 (L) >60 mL/min    Comment: (NOTE) The eGFR has been calculated using the CKD EPI equation. This calculation has not been validated in all clinical situations. eGFR's persistently <60 mL/min signify possible Chronic Kidney Disease.    Anion gap 7 5 - 15  Glucose, capillary     Status: Abnormal   Collection Time: 07/11/16 11:41 AM  Result Value Ref Range   Glucose-Capillary 138 (H) 65 - 99 mg/dL    Current Facility-Administered Medications  Medication Dose Route Frequency Provider Last Rate Last Dose  . 0.9 %  sodium chloride infusion   Intravenous Continuous Ivor Costa, MD 75 mL/hr at 07/10/16 1359 1,000 mL at 07/10/16 1359  . acetaminophen (TYLENOL) tablet 1,000 mg  1,000 mg Oral Q6H PRN Ivor Costa, MD   1,000 mg at 07/10/16 2144  . amLODipine (NORVASC) tablet 10 mg  10 mg Oral Daily Ivor Costa, MD   10 mg at 07/11/16 0944  . cholecalciferol (VITAMIN D) tablet 1,000 Units  1,000 Units Oral Beverly Gust Ivor Costa, MD   1,000 Units at 07/10/16 [phone]  . clonazePAM (KLONOPIN) tablet 1 mg  1 mg Oral BID Mariel Aloe, MD   1 mg at 07/10/16 2144  . cyclobenzaprine (FLEXERIL) tablet 10 mg  10 mg Oral TID PRN Ivor Costa, MD      . escitalopram (LEXAPRO) tablet 10 mg  10 mg Oral Daily Ivor Costa, MD   10 mg at 07/11/16 0945  . fenofibrate tablet 160 mg  160 mg Oral Daily Ivor Costa, MD   160 mg at 07/11/16 0944  . furosemide (LASIX) tablet 40 mg  40 mg Oral BID Ivor Costa, MD   40 mg at 07/11/16 0806  . heparin injection 5,000 Units  5,000 Units Subcutaneous Q8H Ivor Costa, MD   5,000 Units at 07/11/16 0600  . hydrALAZINE (APRESOLINE) injection 5 mg  5 mg Intravenous Q8H PRN Mariel Aloe, MD      . hydrALAZINE  (APRESOLINE) tablet 50 mg  50 mg Oral TID Ivor Costa, MD   50 mg at 07/11/16 0945  . insulin aspart (novoLOG) injection 0-5 Units  0-5 Units Subcutaneous QHS Ivor Costa, MD      . insulin aspart (novoLOG) injection 0-9 Units  0-9 Units Subcutaneous TID WC Ivor Costa, MD      . isosorbide mononitrate (IMDUR) 24 hr tablet 60 mg  60 mg Oral Daily Ivor Costa, MD   60 mg at 07/11/16 0945  . LORazepam (ATIVAN) tablet 0.5 mg  0.5 mg Oral Q6H PRN Mariel Aloe, MD       Or  . LORazepam (ATIVAN) injection 0.5 mg  0.5 mg Intramuscular Q6H PRN Mariel Aloe, MD   0.5 mg at 07/10/16 2244  . magnesium oxide (MAG-OX) tablet 400 mg  400 mg  Oral Daily Ivor Costa, MD   400 mg at 07/11/16 0806  . meclizine (ANTIVERT) tablet 25 mg  25 mg Oral TID PRN Ivor Costa, MD      . multivitamin with minerals tablet   Oral Daily Ivor Costa, MD   1 tablet at 07/11/16 0806  . mycophenolate (CELLCEPT) capsule 750 mg  750 mg Oral BID Ivor Costa, MD   750 mg at 07/11/16 0943  . nitroGLYCERIN (NITROSTAT) SL tablet 0.4 mg  0.4 mg Sublingual Q5 min PRN Ivor Costa, MD      . ondansetron [facility]) tablet 4 mg  4 mg Oral Q6H PRN Ivor Costa, MD       Or  . ondansetron [facility]) injection 4 mg  4 mg Intravenous Q6H PRN Ivor Costa, MD      . predniSONE (DELTASONE) tablet 5 mg  5 mg Oral Q M,W,F Ivor Costa, MD   5 mg at 07/11/16 0806  . sodium chloride flush (NS) 0.9 % injection 3 mL  3 mL Intravenous Q12H Ivor Costa, MD   3 mL at 07/10/16 2200  . tacrolimus (PROGRAF) capsule 5 mg  5 mg Oral BID Ivor Costa, MD   5 mg at 07/11/16 6578    Musculoskeletal: Strength & Muscle Tone: decreased Gait & Station: unsteady Patient leans: N/A  Psychiatric Specialty Exam: Physical Exam as per history and physical  ROS confusion, speech abnormalities, confusion, unsteady gait, generalized weakness and dizziness. She has anxiety of unknown. Denied nausea, vomiting, SOB and chest pain. No Fever-chills, No Headache, No changes with Vision or hearing, reports  vertigo No problems swallowing food or Liquids, No Chest pain, Cough or Shortness of Breath, No Abdominal pain, No Nausea or Vommitting, Bowel movements are regular, No Blood in stool or Urine, No dysuria, No new skin rashes or bruises, No new joints pains-aches,  No new weakness, tingling, numbness in any extremity, No recent weight gain or loss, No polyuria, polydypsia or polyphagia,  A full 10 point Review of Systems was done, except as stated above, all other Review of Systems were negative.  Blood pressure (!) 164/102, pulse 83, temperature 98.9 F (37.2 C), temperature source Oral, resp. rate 20, height '5\' 5"'  (1.651 m), weight 63.4 kg (139 lb 12.8 oz), SpO2 100 %.Body mass index is 23.26 kg/m.  General Appearance: Casual  Eye Contact:  Good  Speech:  Blocked and Slurred  Volume:  Decreased  Mood:  Depressed  Affect:  Constricted and Depressed  Thought Process:  Disorganized and Irrelevant  Orientation:  Full (Time, Place, and Person)  Thought Content:  WDL  Suicidal Thoughts:  No  Homicidal Thoughts:  No  Memory:  Immediate;   Fair Recent;   Poor Remote;   Fair  Judgement:  Fair  Insight:  Fair  Psychomotor Activity:  Decreased  Concentration:  Concentration: Fair and Attention Span: Poor  Recall:  [employer] of Knowledge:  Fair  Language:  Fair  Akathisia:  Negative  Handed:  Right  AIMS (if indicated):     Assets:  Desire for Improvement Financial Resources/Insurance Housing Leisure Time Social Support Transportation  ADL's:  Impaired  Cognition:  Impaired,  Moderate  Sleep:        Treatment Plan Summary: Patient is a 61 years old female with history of anxiety disorder but no previous psychiatric out patient or inpatient treatment presented with multiple neurological symptoms as listed in history of present illness admitted for possible MS or demyelinating disorder of unknown at  this time. She has no irritability, agitation or aggression. She denied  hallucination, delusions or paranoia. She has no suicide or homicide ideation, intention or plans.  We believe she needs further neurological work up and treatment and psychiatric consultation is limited to maintaining her anxiety or agitation if needed but does not believe she has any acute or chronic primary psychiatric illness that needs to be addressed at this time.   Appreciate psychiatric consultation and we sign off as of today Please contact [phone] or [phone] if needs further assistance   Disposition: Supportive therapy provided about ongoing stressors.  Ambrose Finland, MD 07/11/2016 12:34 PM

## 2016-07-11 NOTE — Progress Notes (Signed)
Inpatient Rehabilitation  Per PT request, patient was screened by Gunnar Fusi for appropriateness for an Inpatient Acute Rehab consult.  At this time we are planning to follow for a definitive diagnosis.  Additionally, an OT eval would be recommended to pursue IP Rehab.  Please call with questions.    Carmelia Roller., CCC/SLP Admission Coordinator  [place]  Cell [phone]

## 2016-07-11 NOTE — Evaluation (Addendum)
Occupational Therapy Evaluation Patient Details Name: [name] [name] MRN: [id number] DOB: 08-05-1955 Today's Date: 07/11/2016    History of Present Illness [name] [name] is a 61 y.o. female with medical history significant of hypertension, hyperlipidemia, diabetes mellitus, GERD, depression, anxiety, thoracic aortic aneurysm, CKD-IV, anemia, carotid artery stenosis, history of kidney transplantation on immunosuppressants, dCHF. She presented with altered mental status on 07/07/17.[name] [name] recent hospitalization for anxiety.Rapid response called for episode of shaking, decreased communication. Patient being followed by neurology and psychiatry for definitive diagnoses.   Clinical Impression   Pt was admitted for the above. She will benefit from continued OT to increase safety and independence with adls.  Pt has tremors which did not affect gross motor ADLs, language difficulties, decreased balance and decreased safety. Need to further assess perceptual skills.  She recently had family's assistance. She would benefit from continued OT in acute as well as post acute rehab.  Goals in acute are for min guard level.  Pt needed min A +2 for ambulating to bathroom and min A for ADLs at time of eval    Follow Up Recommendations  CIR    Equipment Recommendations  3 in 1 bedside commode    Recommendations for Other Services Rehab consult;Speech consult (speech for cog/language)     Precautions / Restrictions Precautions Precautions: Fall Restrictions Weight Bearing Restrictions: No      Mobility Bed Mobility         Supine to sit: Supervision;Min guard     General bed mobility comments: pt tends to move quickly.  Min guard for lines as pt tended to get hand caught up in tele leads  Transfers     Transfers: Sit to/from Stand Sit to Stand: Min assist         General transfer comment: assist to rise and stabilize.  +2 assistance to walk due to IV pole:  see ADL section above     Balance Overall balance assessment: History of Falls;Needs assistance   Sitting balance-Leahy Scale: Fair     Standing balance support: During functional activity;Bilateral upper extremity supported Standing balance-Leahy Scale: Poor                             ADL either performed or assessed with clinical judgement   ADL Overall ADL's : Needs assistance/impaired Eating/Feeding: Independent   Grooming: Set up;Sitting;Supervision/safety   Upper Body Bathing: Set up;Sitting;Supervision/ safety   Lower Body Bathing: Minimal assistance;Sit to/from stand   Upper Body Dressing : Set up;Supervision/safety;Sitting   Lower Body Dressing: Minimal assistance;Sit to/from stand Lower Body Dressing Details (indicate cue type and reason): pt able to put socks on with extra time Toilet Transfer: Minimal assistance;+2 for safety/equipment Toilet Transfer Details (indicate cue type and reason): ambulated to bathroom.  Pt wanted to hold to IV pole and at times came close to stepping on base of IV pole Toileting- Clothing Manipulation and Hygiene: Minimal assistance;Sit to/from stand         General ADL Comments: Had +2 assistance to walk to bathroom as pt wanted to hold to IV pole and feet were stepping near base.  Used hand held assist of 2 for safety.  Pt had one posterior LOB.  ? visual spatial deficits in standing.  Pt was able to reach for and retrieve items without difficulty.     Vision   Additional Comments: Attempted to look at vision.  Pt did not move eyes to track but  pointed to therapist's fingers when testing for visual field.  Able to identify numbers on clock when therapist pointed to them, but unable to state time (language)     Perception     Praxis      Pertinent Vitals/Pain Pain Assessment: Faces Faces Pain Scale: Hurts whole lot Pain Location: headache Pain Descriptors / Indicators: Aching Pain Intervention(s): Patient requesting pain meds-RN notified      Hand Dominance     Extremity/Trunk Assessment Upper Extremity Assessment Upper Extremity Assessment: Generalized weakness (bil tremor) RUE Deficits / Details: able to don socks, take pills in pill cup with styrofoam cup.           Communication Communication Communication:  (added onto words at times; unable to state time)   Cognition Arousal/Alertness: Awake/alert Behavior During Therapy: [facility] for tasks assessed/performed                           Following Commands:  (follows commands in context with multimodal cues) Safety/Judgement: Decreased awareness of safety     General Comments: pt with language difficulties.  Multimodal cues.  Impulsive at times   General Comments       Exercises     Shoulder Instructions      Home Living Family/patient expects to be discharged to:: Private residence Living Arrangements: Other relatives                               Additional Comments: patient has been living with sister and mother since last admission      Prior Functioning/Environment                   OT Problem List: Decreased strength;Impaired balance (sitting and/or standing);Decreased activity tolerance;Decreased coordination;Decreased cognition;Pain;Decreased safety awareness      OT Treatment/Interventions: Self-care/ADL training;DME and/or AE instruction;Therapeutic activities;Balance training;Cognitive remediation/compensation;Visual/perceptual remediation/compensation;Patient/family education    OT Goals(Current goals can be found in the care plan section) Acute Rehab OT Goals Patient Stated Goal: per daughter, to get therapy, find out what is wrong OT Goal Formulation: With patient/family Time For Goal Achievement: 07/18/16 Potential to Achieve Goals: Good ADL Goals Pt Will Perform Grooming: with min guard assist;standing (2 tasks) Pt Will Transfer to Toilet: with min guard assist;ambulating;bedside commode Pt Will  Perform Toileting - Clothing Manipulation and hygiene: with min guard assist;sit to/from stand Additional ADL Goal #1: pt will complete ADL with set up/supervision with min guard assistance Additional ADL Goal #2: pt will follow functional commands in context with one verbal and one visual cue and not need any cues for safety  OT Frequency: Min 2X/week   Barriers to D/C:            Co-evaluation              End of Session    Activity Tolerance: Patient tolerated treatment well Patient left: in bed;with call bell/phone within reach;with nursing/sitter in room;Other (comment) (tele sitter in room)  OT Visit Diagnosis: Unsteadiness on feet (R26.81);Muscle weakness (generalized) (M62.81)                Time: 1432-1500 OT Time Calculation (min): 28 min Charges:  OT General Charges $OT Visit: 1 Procedure OT Evaluation $OT Eval Moderate Complexity: 1 Procedure OT Treatments $Self Care/Home Management : 8-22 mins G-Codes:     [place], OTR/L [phone] 07/11/2016  Missie Gehrig 07/11/2016, 3:47 PM

## 2016-07-11 NOTE — Evaluation (Signed)
Physical Therapy Evaluation Patient Details Name: [name] [name] MRN: [id number] DOB: [birth year] Today's Date: 07/11/2016   History of Present Illness  [name] [name] is a 61 y.o. femalewith medical history significant of hypertension, hyperlipidemia, diabetes mellitus, GERD, depression, anxiety, thoracic aortic aneurysm, CKD-IV, anemia, carotid artery stenosis, history of kidney transplantation on immunosuppressants, dCHF. She presented with altered mental status on 07/07/17.[name] recent hospitalization for anxiety.Rapid response called for episode of shaking, decreased communication. Patient being followed by neurology and psychiatry for definitive diagnoses.  Clinical Impression  *The patient presents with significant deficits in communication, following commands, ataxic gait, decreased balance, impaired cognition. The patient's daughter present. The patient would frequently look to daughter for answers to  Questions related to prior function.Pt admitted with above diagnosis. Pt currently with functional limitations due to the deficits listed below (see PT Problem List). * Pt will benefit from skilled PT to increase their independence and safety with mobility to allow discharge to the venue listed below.       Follow Up Recommendations CIR;Supervision/Assistance - 24 hour    Equipment Recommendations  None recommended by PT    Recommendations for Other Services Rehab consult , OT, Speech therapy/cognitiveassessment    Precautions / Restrictions Precautions Precautions: Fall Restrictions Weight Bearing Restrictions: No      Mobility  Bed Mobility Overal bed mobility: Needs Assistance Bed Mobility: Supine to Sit;Sit to Supine     Supine to sit: Supervision Sit to supine: Supervision   General bed mobility comments: multimodal cues to initiate activity to sit up. pt. pulls on sheet and mattress to pull to bed edge. extra time to scoot to edge, rocks trunk to  scoot.  Transfers Overall transfer level: Needs assistance Equipment used: 1 person hand held assist Transfers: Sit to/from Stand Sit to Stand: Min assist         General transfer comment: patient requires steady assist to balance after standing. pt. pushes against the bed with legs. noted to  lean posteriorly after standing up and leaning  against bed.  Ambulation/Gait Ambulation/Gait assistance: Min assist Ambulation Distance (Feet): 100 Feet (x2) Assistive device: 1 person hand held assist Gait Pattern/deviations: Step-through pattern;Staggering right;Staggering left;Drifts right/left;Ataxic;Wide base of support   Gait velocity interpretation: <1.8 ft/sec, indicative of risk for recurrent falls General Gait Details: noted to have increased balance loss when turns head to look at therapist and during turns. Requires tactile and verbal cues to follow directions for standing up. Noted atixic gait, requires assistance for balance.  Stairs            Wheelchair Mobility    Modified Rankin (Stroke Patients Only)       Balance Overall balance assessment: History of Falls;Needs assistance Sitting-balance support: Feet supported;No upper extremity supported Sitting balance-Leahy Scale: Fair     Standing balance support: During functional activity;No upper extremity supported Standing balance-Leahy Scale: Poor                               Pertinent Vitals/Pain Pain Assessment: No/denies pain    Home Living Family/patient expects to be discharged to:: Private residence Living Arrangements: Other relatives Available Help at Discharge: Family Type of Home: House Home Access: Stairs to enter Entrance Stairs-Rails: Psychiatric nurse of Steps: 4 Home Layout: One level Home Equipment: None Additional Comments: patient has been living with sister and mother since last admission    Prior Function Level of Independence: Needs assistance  Gait / Transfers Assistance Needed: per daughter, was staying with sister, had supervision but was ambulatory, was to have HHPT/OT/SP ,only seen x 1 since last admission.           Hand Dominance   Dominant Hand: Left    Extremity/Trunk Assessment   Upper Extremity Assessment Upper Extremity Assessment: RUE deficits/detail;LUE deficits/detail RUE Deficits / Details: noted tremors, dysmetria LUE Deficits / Details: similar to right    Lower Extremity Assessment Lower Extremity Assessment: RLE deficits/detail;LLE deficits/detail RLE Deficits / Details: noted tremors, shaking/jerking of legs in standing. AMild ataxia, decreased control /rapid movements when  actively mobving the legs for strength testing. Strength is WFL. LLE Deficits / Details: similar to right    Cervical / Trunk Assessment Cervical / Trunk Assessment: Other exceptions Cervical / Trunk Exceptions: trunk titubations while standing and sitting  Communication   Communication: Receptive difficulties;Expressive difficulties  Cognition Arousal/Alertness: Awake/alert Behavior During Therapy: WFL for tasks assessed/performed Overall Cognitive Status: Impaired/Different from baseline Area of Impairment: Orientation;Memory;Following commands;Safety/judgement;Awareness;Problem solving  The patient had difficult reading  Signs in the hall, able to say some words correctly, able to name 2 items in  A painting  But not many. Some words jibberish.               Orientation Level: Place;Time   Memory: Decreased short-term memory Following Commands: Follows one step commands inconsistently Safety/Judgement: Decreased awareness of safety Awareness: Emergent Problem Solving: Slow processing;Decreased initiation;Difficulty sequencing;Requires verbal cues;Requires tactile cues General Comments: pt refers to daughter for answers, the patient is unable to effectively ,verbally communicate.      General Comments  General comments (skin integrity, edema, etc.): unable to attempt single leg, tandem stance even with UE support, difficulty following directions. when standing at bed edge, tends to lean against the bed.  Does not maintain eyes closed for testing-opens eyes .    Exercises     Assessment/Plan    PT Assessment Patient needs continued PT services  PT Problem List Decreased activity tolerance;Decreased balance;Decreased mobility;Decreased cognition;Decreased coordination;Decreased knowledge of use of DME;Decreased safety awareness;Decreased knowledge of precautions;Cardiopulmonary status limiting activity       PT Treatment Interventions Gait training;Functional mobility training;Therapeutic activities;Stair training;Balance training;Patient/family education;Cognitive remediation;Neuromuscular re-education    PT Goals (Current goals can be found in the Care Plan section)  Acute Rehab PT Goals Patient Stated Goal: per daughter, to get therapy, find out what is wrong PT Goal Formulation: With patient/family Time For Goal Achievement: 07/25/16 Potential to Achieve Goals: Good    Frequency Min 4X/week   Barriers to discharge Decreased caregiver support      Co-evaluation               End of Session Equipment Utilized During Treatment: Gait belt Activity Tolerance: Patient tolerated treatment well Patient left: in bed;with call bell/phone within reach;with family/visitor present;Other (comment) [employer]) Nurse Communication: Mobility status PT Visit Diagnosis: Apraxia (R48.2);History of falling (Z91.81);Ataxic gait (R26.0);Other abnormalities of gait and mobility (R26.89)    Time: 4782-9562 PT Time Calculation (min) (ACUTE ONLY): 40 min   Charges:   PT Evaluation $PT Eval Moderate Complexity: 1 Procedure PT Treatments $Gait Training: 8-22 mins $Neuromuscular Re-education: 8-22 mins   PT G CodesTresa Endo PT [phone]   Claretha Cooper 07/11/2016,  11:12 AM

## 2016-07-11 NOTE — Progress Notes (Signed)
PROGRESS NOTE    [name] [name]  BHA:[id number] DOB: [birth year] DOA: 07/07/2016 PCP: Annye Asa, MD   Brief Narrative: [name] [name] is a 61 y.o. female with medical history significant of hypertension, hyperlipidemia, diabetes mellitus, GERD, depression, anxiety, thoracic aortic aneurysm, CKD-IV, anemia, carotid artery stenosis, history of kidney transplantation on immunosuppressants, dCHF. She presented with altered mental status.   Assessment & Plan:   Principal Problem:   Acute encephalopathy Active Problems:   Essential hypertension   History of renal transplant   Diabetes mellitus type II, controlled ([facility] [place])   Acute renal failure superimposed on stage 4 chronic kidney disease (HCC)   Chronic diastolic CHF (congestive heart failure) (HCC)   Anemia   Altered mental status   Acute encephalopathy Unknown cause. Patient has workup for MS vs other demyelinating disease vs autoimmune as an outpatient. Previously worked up on previous admission. MRI unchanged from previous. ?seizure. EEG not suggestive of a seizure, but showed slowing. MRI cervical spine significant for nothing new that would contribute to symptoms. CSF fluid traumatic, studies pending (JC virus PCR, IgG index, VDRL). -neurology recommendations -neuro checks -continue ativan prn -psychiatry consult  Essential hypertension -continue amlodipine, hydralazine and lasix  History of renal transplant -continue Cellcept, Tacrolimus and Prednisone  Acute on chronic kidney disease, stage III Improved with IV fluids  Diabetes mellitus, type 2 Last A1c of 6.2 -continue SSI  Chronic diastolic CHF Echocardiogram on 12/12/14 with EF of 60-65% and grade 1 diastolic dysfunction -continue Lasix  Anemia Stable. No bleeding.  Fever No source. -blood culture  DVT prophylaxis: heparin Code Status: Full code Family Communication: son at bedside Disposition Plan: Discharge likely tomorrow   Consultants:    Neurology  Procedures:   None  Antimicrobials:  None    Subjective: Patient had a very bad panic attack yesterday that improved with ativan. She had another episode last night. She was mildly febrile. No other complaints.  Objective: Vitals:   07/10/16 2120 07/11/16 0502 07/11/16 0900 07/11/16 0943  BP: (!) 162/98 (!) 152/96 (!) 164/102 (!) 164/102  Pulse: (!) 105 88 83   Resp: 20 20    Temp: (!) 100.4 F (38 C) 98.5 F (36.9 C) 98.9 F (37.2 C)   TempSrc: Oral Oral Oral   SpO2: 97% 95% 100%   Weight:  63.4 kg (139 lb 12.8 oz)    Height:        Intake/Output Summary (Last 24 hours) at 07/11/16 1305 Last data filed at 07/11/16 0900  Gross per 24 hour  Intake             5085 ml  Output                0 ml  Net             5085 ml   Filed Weights   07/08/16 1522 07/09/16 0603 07/11/16 0502  Weight: 63.6 kg (140 lb 4.8 oz) 65 kg (143 lb 3.2 oz) 63.4 kg (139 lb 12.8 oz)    Examination:  General exam: Appears calm and comfortable  Respiratory system: Clear to auscultation. Respiratory effort normal. Cardiovascular system: S1 & S2 heard, RRR. No murmurs, rubs, gallops or clicks. Gastrointestinal system: Abdomen is nondistended, soft and nontender. Normal bowel sounds heard. Central nervous system: Alert and oriented to person, place and year. No focal neurological deficits. Extremities: No edema. No calf tenderness Skin: No cyanosis. No rashes Psychiatry: Judgement and insight appear impaired. Mood & affect flat and anxious  Data Reviewed: I have personally reviewed following labs and imaging studies  CBC:  Recent Labs Lab 07/07/16 1830 07/08/16 0450 07/10/16 0615  WBC 6.7 7.5 6.5  HGB 9.8* 9.1* 9.5*  HCT 30.7* 27.0* 29.0*  MCV 85.8 86.8 87.3  PLT 336 272 751   Basic Metabolic Panel:  Recent Labs Lab 07/07/16 1830 07/08/16 0450 07/10/16 0615 07/11/16 0943  NA 138 136 139 140  K 4.2 3.6 3.1* 3.1*  CL 103 104 105 109  CO2 26 24 21* 24    GLUCOSE 200* 120* 97 198*  BUN 30* 31* 26* 21*  CREATININE 3.11* 2.83* 2.65* 2.60*  CALCIUM 9.2 8.9 8.4* 8.6*   GFR: Estimated Creatinine Clearance: 21.2 mL/min (A) (by C-G formula based on SCr of 2.6 mg/dL (H)). Liver Function Tests:  Recent Labs Lab 07/07/16 1830  AST 22  ALT 22  ALKPHOS 43  BILITOT 0.5  PROT 7.2  ALBUMIN 4.1   No results for input(s): LIPASE, AMYLASE in the last 168 hours.  Recent Labs Lab 07/08/16 0031  AMMONIA 16   Coagulation Profile: No results for input(s): INR, PROTIME in the last 168 hours. Cardiac Enzymes: No results for input(s): CKTOTAL, CKMB, CKMBINDEX, TROPONINI in the last 168 hours. BNP (last 3 results) No results for input(s): PROBNP in the last 8760 hours. HbA1C: No results for input(s): HGBA1C in the last 72 hours. CBG:  Recent Labs Lab 07/10/16 0734 07/10/16 1138 07/10/16 1621 07/11/16 0726 07/11/16 1141  GLUCAP 94 110* 105* 90 138*   Lipid Profile: No results for input(s): CHOL, HDL, LDLCALC, TRIG, CHOLHDL, LDLDIRECT in the last 72 hours. Thyroid Function Tests: No results for input(s): TSH, T4TOTAL, FREET4, T3FREE, THYROIDAB in the last 72 hours. Anemia Panel: No results for input(s): VITAMINB12, FOLATE, FERRITIN, TIBC, IRON, RETICCTPCT in the last 72 hours. Sepsis Labs: No results for input(s): PROCALCITON, LATICACIDVEN in the last 168 hours.  Recent Results (from the past 240 hour(s))  Urine culture     Status: Abnormal   Collection Time: 07/08/16  1:10 AM  Result Value Ref Range Status   Specimen Description URINE, RANDOM  Final   Special Requests NONE  Final   Culture MULTIPLE SPECIES PRESENT, SUGGEST RECOLLECTION (A)  Final   Report Status 07/09/2016 FINAL  Final         Radiology Studies: Mr Cervical Spine Wo Contrast  Result Date: 07/10/2016 CLINICAL DATA:  Confusion and hallucinations. History of multiple sclerosis. Elevated GFR. EXAM: MRI CERVICAL SPINE WITHOUT CONTRAST TECHNIQUE: Multiplanar,  multisequence MR imaging of the cervical spine was performed. No intravenous contrast was administered. COMPARISON:  MRI brain from 07/08/2016 FINDINGS: Despite efforts by the technologist and patient, motion artifact is present on today's exam and could not be eliminated. This reduces exam sensitivity and specificity. Alignment: No vertebral subluxation is observed. Vertebrae: No significant vertebral marrow edema is identified. Cord: There is some subtle cord heterogeneity on the sagittal T2 weighted images particularly around the C6 level, but the axial images do not confirm this and a discrete cord lesion is not definitively seen. I suspect that some of this very vague heterogeneity is due to motion artifact. Expected pulsation artifact in the regional CSF. Posterior Fossa, vertebral arteries, paraspinal tissues: Subtle T2 accentuation in the left cerebellar white matter adjacent to a known small venous anomaly as shown on prior brain MRI, unchanged. Disc levels: C2-3: Unremarkable. C3-4:  Unremarkable. C4-5:  Unremarkable. C5-6:  Unremarkable. C6-7:  No impingement.  Small right paracentral disc protrusion. C7-T1:  Unremarkable. IMPRESSION: 1. No definite cord lesion is identified. Vague heterogeneity in the cervical cord around the C6 level is most attributable to the underlying motion artifact. 2. No impingement in the cervical spine although there is a small right paracentral disc protrusion at C6-7. 3. Subtle T2 accentuation in the left cerebellar white matter as described on prior MRI. Electronically Signed   By: Van Clines M.D.   On: 07/10/2016 10:46   Dg Fluoro Guide Lumbar Puncture  Result Date: 07/10/2016 CLINICAL DATA:  Altered mental status with hallucinations and anxiety. EXAM: DIAGNOSTIC LUMBAR PUNCTURE UNDER FLUOROSCOPIC GUIDANCE FLUOROSCOPY TIME:  Fluoroscopy Time:  0 minutes, 34 seconds Radiation Exposure Index (if provided by the fluoroscopic device): 5.4 mGy Number of Acquired Spot  Images: 0 PROCEDURE: I discussed the risks (including hemorrhage, infection, headache, and nerve damage, among others), benefits, and alternatives to fluoroscopically guided lumbar puncture with the patient and her daughter. The patient's daughter is currently making medical decisions for her given the patient's altered mental status. We specifically discussed the high technical likelihood of success of the procedure. The patient and her daughter understood and elected for the patient to undergo the procedure. Standard time-out was employed. Following sterile skin prep and local anesthetic administration consisting of 1 percent lidocaine, a 22 gauge spinal needle was advanced without difficulty into the thecal sac at the at the L3-4 level. Clear CSF was returned. My initial attempt to turn the patient on her side to obtain opening pressure indicated that due to the patient's altered mental status this would be risky. Specifically, I was not confident that the patient would be able to turn her shoulders and hips together, and with the needle in her back I was not comfortable with her twisting her body. Accordingly, I obtain the opening pressure measurement with patient prone. Opening pressure was measured at 29 cm of water. 12 cc of clear CSF was collected. A closing pressure was performed (again, in the prone position) and measured 16 cm of water. The needle was subsequently removed and the skin cleansed and bandaged. No immediate complications were observed. IMPRESSION: 1. Technically successful fluoroscopically guided lumbar puncture at the L3-4 level yielding 12 cc of clear CSF. Due to patient limitations, I was not able to turn the patient in the lateral decubitus position for pressure measurements. This can introduce some inaccuracy. Opening pressure was measured in the prone position at 29 cm water and closing pressure at 16 cm of water. Electronically Signed   By: Van Clines M.D.   On: 07/10/2016  09:56        Scheduled Meds: . amLODipine  10 mg Oral Daily  . cholecalciferol  1,000 Units Oral QODAY  . clonazePAM  1 mg Oral BID  . escitalopram  10 mg Oral Daily  . fenofibrate  160 mg Oral Daily  . furosemide  40 mg Oral BID  . heparin  5,000 Units Subcutaneous Q8H  . hydrALAZINE  50 mg Oral TID  . insulin aspart  0-5 Units Subcutaneous QHS  . insulin aspart  0-9 Units Subcutaneous TID WC  . isosorbide mononitrate  60 mg Oral Daily  . magnesium oxide  400 mg Oral Daily  . multivitamin with minerals   Oral Daily  . mycophenolate  750 mg Oral BID  . predniSONE  5 mg Oral Q M,W,F  . sodium chloride flush  3 mL Intravenous Q12H  . tacrolimus  5 mg Oral BID   Continuous Infusions: . sodium chloride 1,000 mL (  07/10/16 1359)     LOS: 3 days     Cordelia Poche, MD [facility] 07/11/2016, 1:05 PM Pager: [phone]  If 7PM-7AM, please contact night-coverage [email or web address] Password [email or web address] 07/11/2016, 1:05 PM

## 2016-07-11 NOTE — Clinical Social Work Note (Signed)
Clinical Social Work Assessment  Patient Details  Name: [name] [name] MRN: [id number] Date of Birth: [birth year]  Date of referral:  07/11/16               Reason for consult:  Other (Comment Required) (Daughter request information about ALF. )                Permission sought to share information with:  Family Supports Permission granted to share information::  Yes, Verbal Permission Granted  Name::        Agency::     Relationship::  Daughter   Contact Information:     Housing/Transportation Living arrangements for the past 2 months:  Single Family Home Source of Information:  Adult Children Patient Interpreter Needed:  None Criminal Activity/Legal Involvement Pertinent to Current Situation/Hospitalization:  No - Comment as needed Significant Relationships:  Adult Children, Other Family Members Lives with:  Relatives Do you feel safe going back to the place where you live?  Yes Need for family participation in patient care:  Yes (Comment)  Care giving concerns:  Patient daughter is concerned about her mother living with her elderly relatives. Patient adult children do not live in [place], her son lives in [place] and her daughter lives in [place]. She reports it is difficult for them to help care for the patient. She inquired about assisted living facilities in the area.    Social Worker assessment / plan:  4/19-CSW met with patient at bedside, explain role. Patient daughter concerned about patient wellbeing and care. She report the patient has high anxiety and panic attacks. The patient has difficulty remembering things and needs assistance with some her ADL's. She states the patient does not qualify for medicaid but receives a check and pension from her deceased spouse. CSW provided a list of ALF facilities and contact numbers for daughter to follow up.   4/20- CSW and Psychiatrist met with patient and daughter at bedside. Patient worked with PT while daughter provided  information about change in patient mental status and her recent panic attack. Daughter reports she witnessed her mothers panic attack. She is not sure what caused it but is concern about the patient anxiety. She reports the patient sees a outpatient  Dr. Dr. Justine Null for counseling. She has been going to him for about three weeks and plans to follow up once discharge.      Employment status:  Unemployed Forensic scientist:  Medicare PT Recommendations:   (CIR) Information / Referral to community resources:     Patient/Family's Response to care:  Agreeable to care.   Patient/Family's Understanding of and Emotional Response to Diagnosis, Current Treatment, and Prognosis: "She is going to need higher level of care, that my grandmother and her sister can not provide. " They are still trying to determine what is going on with her.   Emotional Assessment Appearance:  Developmentally appropriate Attitude/Demeanor/Rapport:    Affect (typically observed):  Appropriate Orientation:  Oriented to Self, Oriented to Place Alcohol / Substance use:  Not Applicable Psych involvement (Current and /or in the community):  No (Comment)  Discharge Needs  Concerns to be addressed:  Discharge Planning Concerns Readmission within the last 30 days:  No Current discharge risk:  Psychiatric Illness Barriers to Discharge:  Continued Medical Work up   [employer], LCSW 07/11/2016, 2:08 PM

## 2016-07-12 DIAGNOSIS — R443 Hallucinations, unspecified: Secondary | ICD-10-CM

## 2016-07-12 LAB — CBC WITH DIFFERENTIAL/PLATELET
BASOS ABS: 0 10*3/uL (ref 0.0–0.1)
Basophils Relative: 0 %
Eosinophils Absolute: 0.1 10*3/uL (ref 0.0–0.7)
Eosinophils Relative: 1 %
HCT: 26.9 % — ABNORMAL LOW (ref 36.0–46.0)
Hemoglobin: 8.9 g/dL — ABNORMAL LOW (ref 12.0–15.0)
LYMPHS PCT: 22 %
Lymphs Abs: 1.7 10*3/uL (ref 0.7–4.0)
MCH: 28.9 pg (ref 26.0–34.0)
MCHC: 33.1 g/dL (ref 30.0–36.0)
MCV: 87.3 fL (ref 78.0–100.0)
MONO ABS: 0.7 10*3/uL (ref 0.1–1.0)
Monocytes Relative: 9 %
NEUTROS ABS: 5.3 10*3/uL (ref 1.7–7.7)
NEUTROS PCT: 68 %
PLATELETS: 198 10*3/uL (ref 150–400)
RBC: 3.08 MIL/uL — AB (ref 3.87–5.11)
RDW: 14.7 % (ref 11.5–15.5)
WBC: 7.8 10*3/uL (ref 4.0–10.5)

## 2016-07-12 LAB — MAGNESIUM: Magnesium: 1 mg/dL — ABNORMAL LOW (ref 1.7–2.4)

## 2016-07-12 LAB — GLUCOSE, CAPILLARY
GLUCOSE-CAPILLARY: 91 mg/dL (ref 65–99)
GLUCOSE-CAPILLARY: 155 mg/dL — AB (ref 65–99)
GLUCOSE-CAPILLARY: 114 mg/dL — AB (ref 65–99)
Glucose-Capillary: 117 mg/dL — ABNORMAL HIGH (ref 65–99)

## 2016-07-12 LAB — BASIC METABOLIC PANEL
ANION GAP: 9 (ref 5–15)
BUN: 20 mg/dL (ref 6–20)
CALCIUM: 8.8 mg/dL — AB (ref 8.9–10.3)
CHLORIDE: 110 mmol/L (ref 101–111)
CO2: 22 mmol/L (ref 22–32)
CREATININE: 2.52 mg/dL — AB (ref 0.44–1.00)
GFR calc non Af Amer: 20 mL/min — ABNORMAL LOW (ref >60)
GFR, EST AFRICAN AMERICAN: 23 mL/min — AB (ref >60)
GLUCOSE: 109 mg/dL — AB (ref 65–99)
Potassium: 3.6 mmol/L (ref 3.5–5.1)
Sodium: 141 mmol/L (ref 135–145)

## 2016-07-12 MED ORDER — LORAZEPAM 2 MG/ML IJ SOLN
INTRAMUSCULAR | Status: AC
  Administered 2016-07-12: 1 mg
  Filled 2016-07-12: qty 1

## 2016-07-12 MED ORDER — LORAZEPAM 2 MG/ML IJ SOLN
1.0000 mg | Freq: Four times a day (QID) | INTRAMUSCULAR | Status: DC | PRN
  Administered 2016-07-13: 1 mg via INTRAVENOUS
  Filled 2016-07-12: qty 1

## 2016-07-12 MED ORDER — MAGNESIUM SULFATE 2 GM/50ML IV SOLN
2.0000 g | Freq: Once | INTRAVENOUS | Status: AC
Start: 2016-07-12 — End: 2016-07-12
  Administered 2016-07-12: 2 g via INTRAVENOUS
  Filled 2016-07-12: qty 50

## 2016-07-12 MED ORDER — LORAZEPAM 1 MG PO TABS
1.0000 mg | ORAL_TABLET | Freq: Four times a day (QID) | ORAL | Status: DC | PRN
  Administered 2016-07-12 – 2016-07-13 (×2): 1 mg via ORAL
  Filled 2016-07-12 (×2): qty 1

## 2016-07-12 MED ORDER — LORAZEPAM 2 MG/ML IJ SOLN: 1.0000 mg | Freq: Once | INTRAMUSCULAR | Status: AC

## 2016-07-12 NOTE — Evaluation (Signed)
Speech Language Pathology Evaluation Patient Details Name: [name] [name] MRN: [id number] DOB: 06/05/1955 Today's Date: 07/12/2016 Time: 1191-4782 SLP Time Calculation (min) (ACUTE ONLY): 28 min  Problem List:  Patient Active Problem List   Diagnosis Date Noted  . Acute encephalopathy 07/08/2016  . Acute renal failure superimposed on stage 4 chronic kidney disease ([place]) 07/08/2016  . Chronic diastolic CHF (congestive heart failure) ([place]) 07/08/2016  . Anemia 07/08/2016  . Altered mental status 07/08/2016  . Abnormal brain MRI 07/03/2016  . Fasciculation 06/18/2016  . Toxic encephalopathy 06/18/2016  . Dizziness and giddiness 04/29/2016  . Carotid artery disease ([place])   . Physical exam 01/28/2016  . Hyperlipidemia 06/28/2015  . Diabetes mellitus type II, controlled ([place]) 06/28/2015  . Grief 06/28/2015  . Thoracic ascending aortic aneurysm (41 mm on Echo 06/2013) 07/06/2013  . ESRD (end stage renal disease) ([place]) 06/16/2013  . History of renal transplant 06/16/2013  . GERD 08/13/2009  . OTH&UNSPEC NONINFECTIOUS GASTROENTERITIS&COLITIS 08/13/2009  . Essential hypertension 04/25/2009  . HEMORRHOIDS-INTERNAL 04/25/2009  . Chronic kidney disease 04/25/2009  . WEIGHT LOSS 04/25/2009  . NAUSEA AND VOMITING 04/25/2009  . NAUSEA ALONE 04/25/2009  . PERSONAL HX COLONIC POLYPS 04/25/2009   Past Medical History:  Past Medical History:  Diagnosis Date  . Adenomatous colon polyp 04/1998  . Anemia   . Carotid artery disease ([place])    Carotid US 1/18: bilateral ICA 1-39, R thyroid lobe nodule (1.9x2.2x3cm); numerous L thyroid lobe nodules - repeat 1 year  . Chronic kidney disease   . Chronic renal failure    post transplant  . Diabetes mellitus   . Esophagitis    Grade 1 Distal  . GERD (gastroesophageal reflux disease)   . Hemorrhoids   . Hx of cardiovascular stress test    Lexiscan Myoview (06/2013):  No ischemia, EF 66%; normal.  //  Myoview 12/17: EF 62, no ischemia or scar;  Normal  . Hx of echocardiogram    a. Echocardiogram (06/2013):  Mod focal basal hypertrophy, EF 60-65%, normal wall motion, Gr 1 DD, mild AI, mildly dilated ascending aorta (41 mm), mild LAE.; b.  Echo 9/16: mod LVH, EF 60-65%, no RWMA, Gr 1 DD, trivial AI, mild dilated ascending aorta, mild LAE  . Hyperkalemia   . Hyperlipidemia   . Hypertension   . Metabolic acidosis   . Pneumonia   . Seizures ([place])    Past Surgical History:  Past Surgical History:  Procedure Laterality Date  . ABDOMINAL HYSTERECTOMY    . AV FISTULA PLACEMENT  07/04/2005   [place] AV fistula  . AV FISTULA PLACEMENT  08/27/2005  . AV FISTULA PLACEMENT W/ PTFE  08/27/2005  . CESAREAN SECTION    . DG AV DIALYSIS GRAFT DECLOT OR  07/24/2005   AV [employer]-Tex graf  . DG AV DIALYSIS GRAFT DECLOT OR  Thrombosis right forearm, loop arteriovenous   Thrombosis right forearm, loop arteriovenous graft  . KIDNEY TRANSPLANT  2009   Both  . THROMBECTOMY / ARTERIOVENOUS GRAFT REVISION  10/12/2006  . THROMBECTOMY / ARTERIOVENOUS GRAFT REVISION  10/16/2006   HPI:  [name] [name] is a 61 y.o. female with medical history significant of hypertension, hyperlipidemia, diabetes mellitus, GERD, depression, anxiety, thoracic aortic aneurysm, CKD-IV, anemia, carotid artery stenosis, history of kidney transplantation on immunosuppressants, dCHF. Recent hospitalization with and found to have oligoclonal bands on CSF fluid and MRI brain findings suggestive of possible MS vs other demyelinating disorder Vs autoimmune process (graft-versus-host). Head CT negative. Patient being followed  by neurology and psychiatry for definitive diagnoses. Psychiatry assessment 4/20 "believe she needs further neurological work up and treatment and psychiatric consultation is limited to maintaining her anxiety or agitation if needed but does not believe she has any acute or chronic primary psychiatric illness that needs to be addressed at this time".   Assessment /  Plan / Recommendation Clinical Impression  Pt demonstrated moderate-severe linguistic and cognitive deficits. Verbal expression in sentences/conversations consisted of intermittent phonemic and semantic paraphasias, neologisims x 2, hesitations with inconsistent awareness of errors. She self corrected x 2. Comprehension for information slightly above basic was difficult for pt and required repetition. Cognitive impairments included decreased sustained attention, decreased word retrieval, Pt accurately read 3 of 8 words in sentence and dysgraphia during writing assessment. Pt speaks, stops and states "I don't know frequently." Educated and encouraged pt to use synonyms and descriptions. ST will continue to work with pt while in acute care and recommend continued ST at discharge.         SLP Assessment  SLP Recommendation/Assessment: Patient needs continued Speech Lanaguage Pathology Services SLP Visit Diagnosis: Cognitive communication deficit (R41.841)    Follow Up Recommendations   (TBD)    Frequency and Duration min 2x/week  2 weeks      SLP Evaluation Cognition  Overall Cognitive Status: Impaired/Different from baseline Arousal/Alertness: Awake/alert Orientation Level: Oriented to person;Oriented to place;Oriented to time Attention: Sustained Sustained Attention: Impaired Sustained Attention Impairment: Verbal basic Memory: Impaired Memory Impairment: Retrieval deficit;Decreased recall of new information Awareness: Impaired Awareness Impairment: Anticipatory impairment Problem Solving: Appears intact (for verbal, suspect deficits during function) Safety/Judgment: Impaired       Comprehension  Auditory Comprehension Overall Auditory Comprehension: Impaired Yes/No Questions: Not tested Commands: Impaired Multistep Basic Commands: 50-74% accurate Interfering Components: Attention;Working memory;Visual impairments;Anxiety Visual Recognition/Discrimination Discrimination: Not  tested Reading Comprehension Reading Status: Impaired Word level: Within functional limits Sentence Level: Impaired Paragraph Level: Impaired    Expression Expression Primary Mode of Expression: Verbal Verbal Expression Overall Verbal Expression: Impaired Initiation:  (intermittent deficits) Level of Generative/Spontaneous Verbalization: Sentence Repetition: Impaired Level of Impairment: Phrase level Naming: Impairment Confrontation: Impaired (75%) Convergent: Not tested Divergent: Not tested Verbal Errors: Semantic paraphasias;Phonemic paraphasias (intermittent awareness of errors) Pragmatics: No impairment Interfering Components: Attention Written Expression Dominant Hand: Left Written Expression: Exceptions to [place] Dictation Ability: Phrase;Word   Oral / Motor  Oral Motor/Sensory Function Overall Oral Motor/Sensory Function: Within functional limits Motor Speech Overall Motor Speech: Impaired Respiration: Within functional limits Phonation: Normal Resonance: Within functional limits Articulation: Impaired Level of Impairment: Phrase Intelligibility: Intelligible Motor Planning: Witnin functional limits   GO                    Houston Siren 07/12/2016, 5:21 PM   Orbie Pyo Dakisha Schoof M.Ed [employer] [phone]

## 2016-07-12 NOTE — Progress Notes (Signed)
PROGRESS NOTE    [name] [name]  JKK:[id number] DOB: [birth year] DOA: 07/07/2016 PCP: Annye Asa, MD   Brief Narrative: [name] [name] is a 61 y.o. female with medical history significant of hypertension, hyperlipidemia, diabetes mellitus, GERD, depression, anxiety, thoracic aortic aneurysm, CKD-IV, anemia, carotid artery stenosis, history of kidney transplantation on immunosuppressants, dCHF. She presented with altered mental status.   Assessment & Plan:   Principal Problem:   Acute encephalopathy Active Problems:   Essential hypertension   History of renal transplant   Diabetes mellitus type II, controlled ([place])   Acute renal failure superimposed on stage 4 chronic kidney disease ([facility])   Chronic diastolic CHF (congestive heart failure) ([facility])   Anemia   Altered mental status   Acute encephalopathy Unknown cause. Patient has workup for MS vs other demyelinating disease vs autoimmune as an outpatient. Previously worked up on previous admission. MRI unchanged from previous. ?seizure. EEG not suggestive of a seizure, but showed slowing. MRI cervical spine significant for nothing new that would contribute to symptoms. CSF fluid traumatic, studies pending (JC virus PCR, IgG index). VDRL negative. -neurology recommendations -neuro checks -continue ativan prn  Essential hypertension -continue amlodipine, hydralazine and lasix  History of renal transplant -continue Cellcept, Tacrolimus and Prednisone  Acute on chronic kidney disease, stage III Improved with IV fluids  Diabetes mellitus, type 2 Last A1c of 6.2 -continue SSI  Chronic diastolic CHF Echocardiogram on 12/12/14 with EF of 60-65% and grade 1 diastolic dysfunction -continue Lasix  Anemia Stable. No bleeding.  Fever No source. Still occurring. Does not seem infectious, however, patient is immunocompromised. -blood culture -urine culture  DVT prophylaxis: heparin Code Status: Full code Family  Communication: son at bedside Disposition Plan: Discharge pending workup for fevers   Consultants:   Neurology  Psychiatry  Procedures:   None  Antimicrobials:  None    Subjective: Patient reports no issues. Has had an episode of anxiousness this morning. She reports no feelings of anxiety and that these episodes just happen.  Objective: Vitals:   07/12/16 1013 07/12/16 1037 07/12/16 1051 07/12/16 1418  BP: (!) 183/112 (!) 169/90 (!) 158/92 134/86  Pulse: 92 97 (!) 101 90  Resp: (!) 28 (!) 32 (!) 32 20  Temp: 98.9 F (37.2 C) (!) 100.7 F (38.2 C) (!) 100.4 F (38 C) 100 F (37.8 C)  TempSrc: Oral Oral Oral Oral  SpO2: 93% 98% 100% 100%  Weight:      Height:        Intake/Output Summary (Last 24 hours) at 07/12/16 1600 Last data filed at 07/12/16 1225  Gross per 24 hour  Intake                0 ml  Output                1 ml  Net               -1 ml   Filed Weights   07/08/16 1522 07/09/16 0603 07/11/16 0502  Weight: 63.6 kg (140 lb 4.8 oz) 65 kg (143 lb 3.2 oz) 63.4 kg (139 lb 12.8 oz)    Examination:  General exam: Appears slightly agitated but comfortable  Respiratory system: Clear to auscultation. Respiratory effort normal. Cardiovascular system: S1 & S2 heard, RRR. No murmurs. Gastrointestinal system: Abdomen is nondistended, soft and nontender. Normal bowel sounds heard. Central nervous system: Alert and oriented to person, place and year. No focal neurological deficits. Extremities: No edema. No calf tenderness  Skin: No cyanosis. No rashes Psychiatry: Judgement and insight appear normal. Mood & affect flat and anxious    Data Reviewed: I have personally reviewed following labs and imaging studies  CBC:  Recent Labs Lab 07/07/16 1830 07/08/16 0450 07/10/16 0615  WBC 6.7 7.5 6.5  HGB 9.8* 9.1* 9.5*  HCT 30.7* 27.0* 29.0*  MCV 85.8 86.8 87.3  PLT 336 272 865   Basic Metabolic Panel:  Recent Labs Lab 07/07/16 1830 07/08/16 0450  07/10/16 0615 07/11/16 0943 07/12/16 0935  NA 138 136 139 140 141  K 4.2 3.6 3.1* 3.1* 3.6  CL 103 104 105 109 110  CO2 26 24 21* 24 22  GLUCOSE 200* 120* 97 198* 109*  BUN 30* 31* 26* 21* 20  CREATININE 3.11* 2.83* 2.65* 2.60* 2.52*  CALCIUM 9.2 8.9 8.4* 8.6* 8.8*  MG  --   --   --   --  1.0*   GFR: Estimated Creatinine Clearance: 21.9 mL/min (A) (by C-G formula based on SCr of 2.52 mg/dL (H)). Liver Function Tests:  Recent Labs Lab 07/07/16 1830  AST 22  ALT 22  ALKPHOS 43  BILITOT 0.5  PROT 7.2  ALBUMIN 4.1   No results for input(s): LIPASE, AMYLASE in the last 168 hours.  Recent Labs Lab 07/08/16 0031  AMMONIA 16   Coagulation Profile: No results for input(s): INR, PROTIME in the last 168 hours. Cardiac Enzymes: No results for input(s): CKTOTAL, CKMB, CKMBINDEX, TROPONINI in the last 168 hours. BNP (last 3 results) No results for input(s): PROBNP in the last 8760 hours. HbA1C: No results for input(s): HGBA1C in the last 72 hours. CBG:  Recent Labs Lab 07/11/16 1141 07/11/16 1658 07/11/16 2120 07/12/16 0750 07/12/16 1208  GLUCAP 138* 128* 120* 91 155*   Lipid Profile: No results for input(s): CHOL, HDL, LDLCALC, TRIG, CHOLHDL, LDLDIRECT in the last 72 hours. Thyroid Function Tests: No results for input(s): TSH, T4TOTAL, FREET4, T3FREE, THYROIDAB in the last 72 hours. Anemia Panel: No results for input(s): VITAMINB12, FOLATE, FERRITIN, TIBC, IRON, RETICCTPCT in the last 72 hours. Sepsis Labs: No results for input(s): PROCALCITON, LATICACIDVEN in the last 168 hours.  Recent Results (from the past 240 hour(s))  Urine culture     Status: Abnormal   Collection Time: 07/08/16  1:10 AM  Result Value Ref Range Status   Specimen Description URINE, RANDOM  Final   Special Requests NONE  Final   Culture MULTIPLE SPECIES PRESENT, SUGGEST RECOLLECTION (A)  Final   Report Status 07/09/2016 FINAL  Final  Culture, blood (routine x 2)     Status: None  (Preliminary result)   Collection Time: 07/11/16  9:42 AM  Result Value Ref Range Status   Specimen Description BLOOD LEFT ARM  Final   Special Requests   Final    BOTTLES DRAWN AEROBIC AND ANAEROBIC Blood Culture adequate volume   Culture   Final    NO GROWTH < 24 HOURS Performed at [facility], [address], [place], [place] [address]    Report Status PENDING  Incomplete  Culture, blood (routine x 2)     Status: None (Preliminary result)   Collection Time: 07/11/16  9:49 AM  Result Value Ref Range Status   Specimen Description BLOOD LEFT ARM  Final   Special Requests   Final    BOTTLES DRAWN AEROBIC AND ANAEROBIC Blood Culture adequate volume   Culture   Final    NO GROWTH < 24 HOURS Performed at [facility]  [facility], [place] [address] [place], [place] [address]    Report Status PENDING  Incomplete         Radiology Studies: No results found.      Scheduled Meds: . amLODipine  10 mg Oral Daily  . cholecalciferol  1,000 Units Oral QODAY  . clonazePAM  1 mg Oral BID  . escitalopram  10 mg Oral Daily  . fenofibrate  160 mg Oral Daily  . furosemide  40 mg Oral BID  . heparin  5,000 Units Subcutaneous Q8H  . hydrALAZINE  50 mg Oral TID  . insulin aspart  0-5 Units Subcutaneous QHS  . insulin aspart  0-9 Units Subcutaneous TID WC  . isosorbide mononitrate  60 mg Oral Daily  . magnesium oxide  400 mg Oral Daily  . multivitamin with minerals   Oral Daily  . mycophenolate  750 mg Oral BID  . predniSONE  5 mg Oral Q M,W,F  . sodium chloride flush  3 mL Intravenous Q12H  . tacrolimus  5 mg Oral BID   Continuous Infusions: . sodium chloride 75 mL/hr at 07/12/16 0749     LOS: 4 days     Cordelia Poche, MD [facility] 07/12/2016, 4:00 PM Pager: [phone]  If 7PM-7AM, please contact night-coverage [email or web address] Password [email or web address] 07/12/2016, 4:00 PM

## 2016-07-13 DIAGNOSIS — I5022 Chronic systolic (congestive) heart failure: Secondary | ICD-10-CM | POA: Diagnosis not present

## 2016-07-13 DIAGNOSIS — F411 Generalized anxiety disorder: Secondary | ICD-10-CM | POA: Diagnosis not present

## 2016-07-13 DIAGNOSIS — G4089 Other seizures: Secondary | ICD-10-CM | POA: Diagnosis not present

## 2016-07-13 DIAGNOSIS — I509 Heart failure, unspecified: Secondary | ICD-10-CM | POA: Diagnosis not present

## 2016-07-13 DIAGNOSIS — G40919 Epilepsy, unspecified, intractable, without status epilepticus: Secondary | ICD-10-CM | POA: Diagnosis not present

## 2016-07-13 DIAGNOSIS — R259 Unspecified abnormal involuntary movements: Secondary | ICD-10-CM | POA: Diagnosis not present

## 2016-07-13 DIAGNOSIS — D631 Anemia in chronic kidney disease: Secondary | ICD-10-CM | POA: Diagnosis not present

## 2016-07-13 DIAGNOSIS — G9389 Other specified disorders of brain: Secondary | ICD-10-CM | POA: Diagnosis not present

## 2016-07-13 DIAGNOSIS — I517 Cardiomegaly: Secondary | ICD-10-CM | POA: Diagnosis not present

## 2016-07-13 DIAGNOSIS — J9600 Acute respiratory failure, unspecified whether with hypoxia or hypercapnia: Secondary | ICD-10-CM | POA: Diagnosis not present

## 2016-07-13 DIAGNOSIS — I445 Left posterior fascicular block: Secondary | ICD-10-CM | POA: Diagnosis not present

## 2016-07-13 DIAGNOSIS — I5030 Unspecified diastolic (congestive) heart failure: Secondary | ICD-10-CM | POA: Diagnosis not present

## 2016-07-13 DIAGNOSIS — R9401 Abnormal electroencephalogram [EEG]: Secondary | ICD-10-CM | POA: Diagnosis not present

## 2016-07-13 DIAGNOSIS — J810 Acute pulmonary edema: Secondary | ICD-10-CM | POA: Diagnosis not present

## 2016-07-13 DIAGNOSIS — B2700 Gammaherpesviral mononucleosis without complication: Secondary | ICD-10-CM | POA: Diagnosis not present

## 2016-07-13 DIAGNOSIS — J90 Pleural effusion, not elsewhere classified: Secondary | ICD-10-CM | POA: Diagnosis not present

## 2016-07-13 DIAGNOSIS — Z430 Encounter for attention to tracheostomy: Secondary | ICD-10-CM | POA: Diagnosis not present

## 2016-07-13 DIAGNOSIS — N185 Chronic kidney disease, stage 5: Secondary | ICD-10-CM | POA: Diagnosis not present

## 2016-07-13 DIAGNOSIS — Z79899 Other long term (current) drug therapy: Secondary | ICD-10-CM | POA: Diagnosis not present

## 2016-07-13 DIAGNOSIS — R451 Restlessness and agitation: Secondary | ICD-10-CM | POA: Diagnosis not present

## 2016-07-13 DIAGNOSIS — M7989 Other specified soft tissue disorders: Secondary | ICD-10-CM | POA: Diagnosis not present

## 2016-07-13 DIAGNOSIS — R410 Disorientation, unspecified: Secondary | ICD-10-CM | POA: Diagnosis not present

## 2016-07-13 DIAGNOSIS — G9349 Other encephalopathy: Principal | ICD-10-CM

## 2016-07-13 DIAGNOSIS — Z978 Presence of other specified devices: Secondary | ICD-10-CM | POA: Diagnosis not present

## 2016-07-13 DIAGNOSIS — R29818 Other symptoms and signs involving the nervous system: Secondary | ICD-10-CM | POA: Diagnosis not present

## 2016-07-13 DIAGNOSIS — I120 Hypertensive chronic kidney disease with stage 5 chronic kidney disease or end stage renal disease: Secondary | ICD-10-CM | POA: Diagnosis not present

## 2016-07-13 DIAGNOSIS — G40101 Localization-related (focal) (partial) symptomatic epilepsy and epileptic syndromes with simple partial seizures, not intractable, with status epilepticus: Secondary | ICD-10-CM | POA: Diagnosis not present

## 2016-07-13 DIAGNOSIS — T8612 Kidney transplant failure: Secondary | ICD-10-CM | POA: Diagnosis not present

## 2016-07-13 DIAGNOSIS — G40001 Localization-related (focal) (partial) idiopathic epilepsy and epileptic syndromes with seizures of localized onset, not intractable, with status epilepticus: Secondary | ICD-10-CM | POA: Diagnosis not present

## 2016-07-13 DIAGNOSIS — Z01818 Encounter for other preprocedural examination: Secondary | ICD-10-CM | POA: Diagnosis not present

## 2016-07-13 DIAGNOSIS — J9601 Acute respiratory failure with hypoxia: Secondary | ICD-10-CM | POA: Diagnosis not present

## 2016-07-13 DIAGNOSIS — R768 Other specified abnormal immunological findings in serum: Secondary | ICD-10-CM | POA: Diagnosis not present

## 2016-07-13 DIAGNOSIS — I82A11 Acute embolism and thrombosis of right axillary vein: Secondary | ICD-10-CM | POA: Diagnosis not present

## 2016-07-13 DIAGNOSIS — B159 Hepatitis A without hepatic coma: Secondary | ICD-10-CM | POA: Diagnosis not present

## 2016-07-13 DIAGNOSIS — I6521 Occlusion and stenosis of right carotid artery: Secondary | ICD-10-CM | POA: Diagnosis not present

## 2016-07-13 DIAGNOSIS — R0989 Other specified symptoms and signs involving the circulatory and respiratory systems: Secondary | ICD-10-CM | POA: Diagnosis not present

## 2016-07-13 DIAGNOSIS — D696 Thrombocytopenia, unspecified: Secondary | ICD-10-CM | POA: Diagnosis not present

## 2016-07-13 DIAGNOSIS — J219 Acute bronchiolitis, unspecified: Secondary | ICD-10-CM | POA: Diagnosis not present

## 2016-07-13 DIAGNOSIS — G44309 Post-traumatic headache, unspecified, not intractable: Secondary | ICD-10-CM | POA: Diagnosis not present

## 2016-07-13 DIAGNOSIS — R836 Abnormal cytological findings in cerebrospinal fluid: Secondary | ICD-10-CM | POA: Diagnosis not present

## 2016-07-13 DIAGNOSIS — R1312 Dysphagia, oropharyngeal phase: Secondary | ICD-10-CM | POA: Diagnosis not present

## 2016-07-13 DIAGNOSIS — E878 Other disorders of electrolyte and fluid balance, not elsewhere classified: Secondary | ICD-10-CM | POA: Diagnosis not present

## 2016-07-13 DIAGNOSIS — Z86718 Personal history of other venous thrombosis and embolism: Secondary | ICD-10-CM | POA: Diagnosis not present

## 2016-07-13 DIAGNOSIS — F05 Delirium due to known physiological condition: Secondary | ICD-10-CM | POA: Diagnosis not present

## 2016-07-13 DIAGNOSIS — B0089 Other herpesviral infection: Secondary | ICD-10-CM | POA: Diagnosis not present

## 2016-07-13 DIAGNOSIS — R197 Diarrhea, unspecified: Secondary | ICD-10-CM | POA: Diagnosis not present

## 2016-07-13 DIAGNOSIS — I110 Hypertensive heart disease with heart failure: Secondary | ICD-10-CM | POA: Diagnosis not present

## 2016-07-13 DIAGNOSIS — N390 Urinary tract infection, site not specified: Secondary | ICD-10-CM | POA: Diagnosis not present

## 2016-07-13 DIAGNOSIS — R258 Other abnormal involuntary movements: Secondary | ICD-10-CM | POA: Diagnosis not present

## 2016-07-13 DIAGNOSIS — R760 Raised antibody titer: Secondary | ICD-10-CM | POA: Diagnosis not present

## 2016-07-13 DIAGNOSIS — Y95 Nosocomial condition: Secondary | ICD-10-CM | POA: Diagnosis not present

## 2016-07-13 DIAGNOSIS — G0490 Encephalitis and encephalomyelitis, unspecified: Secondary | ICD-10-CM | POA: Diagnosis not present

## 2016-07-13 DIAGNOSIS — D803 Selective deficiency of immunoglobulin G [IgG] subclasses: Secondary | ICD-10-CM | POA: Diagnosis not present

## 2016-07-13 DIAGNOSIS — N186 End stage renal disease: Secondary | ICD-10-CM | POA: Diagnosis not present

## 2016-07-13 DIAGNOSIS — E871 Hypo-osmolality and hyponatremia: Secondary | ICD-10-CM | POA: Diagnosis not present

## 2016-07-13 DIAGNOSIS — I629 Nontraumatic intracranial hemorrhage, unspecified: Secondary | ICD-10-CM | POA: Diagnosis not present

## 2016-07-13 DIAGNOSIS — Z781 Physical restraint status: Secondary | ICD-10-CM | POA: Diagnosis not present

## 2016-07-13 DIAGNOSIS — F419 Anxiety disorder, unspecified: Secondary | ICD-10-CM | POA: Diagnosis not present

## 2016-07-13 DIAGNOSIS — B259 Cytomegaloviral disease, unspecified: Secondary | ICD-10-CM | POA: Diagnosis not present

## 2016-07-13 DIAGNOSIS — T82898A Other specified complication of vascular prosthetic devices, implants and grafts, initial encounter: Secondary | ICD-10-CM | POA: Diagnosis not present

## 2016-07-13 DIAGNOSIS — I82611 Acute embolism and thrombosis of superficial veins of right upper extremity: Secondary | ICD-10-CM | POA: Diagnosis not present

## 2016-07-13 DIAGNOSIS — B952 Enterococcus as the cause of diseases classified elsewhere: Secondary | ICD-10-CM | POA: Diagnosis not present

## 2016-07-13 DIAGNOSIS — D72829 Elevated white blood cell count, unspecified: Secondary | ICD-10-CM | POA: Diagnosis not present

## 2016-07-13 DIAGNOSIS — N179 Acute kidney failure, unspecified: Secondary | ICD-10-CM | POA: Diagnosis not present

## 2016-07-13 DIAGNOSIS — Z4822 Encounter for aftercare following kidney transplant: Secondary | ICD-10-CM | POA: Diagnosis not present

## 2016-07-13 DIAGNOSIS — Z90710 Acquired absence of both cervix and uterus: Secondary | ICD-10-CM | POA: Diagnosis not present

## 2016-07-13 DIAGNOSIS — I088 Other rheumatic multiple valve diseases: Secondary | ICD-10-CM | POA: Diagnosis not present

## 2016-07-13 DIAGNOSIS — Z4682 Encounter for fitting and adjustment of non-vascular catheter: Secondary | ICD-10-CM | POA: Diagnosis not present

## 2016-07-13 DIAGNOSIS — L89322 Pressure ulcer of left buttock, stage 2: Secondary | ICD-10-CM | POA: Diagnosis not present

## 2016-07-13 DIAGNOSIS — E042 Nontoxic multinodular goiter: Secondary | ICD-10-CM | POA: Diagnosis not present

## 2016-07-13 DIAGNOSIS — I879 Disorder of vein, unspecified: Secondary | ICD-10-CM | POA: Diagnosis not present

## 2016-07-13 DIAGNOSIS — A858 Other specified viral encephalitis: Secondary | ICD-10-CM | POA: Diagnosis not present

## 2016-07-13 DIAGNOSIS — N184 Chronic kidney disease, stage 4 (severe): Secondary | ICD-10-CM | POA: Diagnosis not present

## 2016-07-13 DIAGNOSIS — I82C12 Acute embolism and thrombosis of left internal jugular vein: Secondary | ICD-10-CM | POA: Diagnosis not present

## 2016-07-13 DIAGNOSIS — R633 Feeding difficulties: Secondary | ICD-10-CM | POA: Diagnosis not present

## 2016-07-13 DIAGNOSIS — T380X5A Adverse effect of glucocorticoids and synthetic analogues, initial encounter: Secondary | ICD-10-CM | POA: Diagnosis not present

## 2016-07-13 DIAGNOSIS — E873 Alkalosis: Secondary | ICD-10-CM | POA: Diagnosis not present

## 2016-07-13 DIAGNOSIS — Z87440 Personal history of urinary (tract) infections: Secondary | ICD-10-CM | POA: Diagnosis not present

## 2016-07-13 DIAGNOSIS — F329 Major depressive disorder, single episode, unspecified: Secondary | ICD-10-CM | POA: Diagnosis not present

## 2016-07-13 DIAGNOSIS — B9689 Other specified bacterial agents as the cause of diseases classified elsewhere: Secondary | ICD-10-CM | POA: Diagnosis not present

## 2016-07-13 DIAGNOSIS — I82401 Acute embolism and thrombosis of unspecified deep veins of right lower extremity: Secondary | ICD-10-CM | POA: Diagnosis not present

## 2016-07-13 DIAGNOSIS — E876 Hypokalemia: Secondary | ICD-10-CM | POA: Diagnosis not present

## 2016-07-13 DIAGNOSIS — J690 Pneumonitis due to inhalation of food and vomit: Secondary | ICD-10-CM | POA: Diagnosis not present

## 2016-07-13 DIAGNOSIS — I5020 Unspecified systolic (congestive) heart failure: Secondary | ICD-10-CM | POA: Diagnosis not present

## 2016-07-13 DIAGNOSIS — R400 Somnolence: Secondary | ICD-10-CM | POA: Diagnosis not present

## 2016-07-13 DIAGNOSIS — G9340 Encephalopathy, unspecified: Secondary | ICD-10-CM | POA: Diagnosis not present

## 2016-07-13 DIAGNOSIS — T8611 Kidney transplant rejection: Secondary | ICD-10-CM | POA: Diagnosis not present

## 2016-07-13 DIAGNOSIS — Z4901 Encounter for fitting and adjustment of extracorporeal dialysis catheter: Secondary | ICD-10-CM | POA: Diagnosis not present

## 2016-07-13 DIAGNOSIS — R000 Tachycardia, unspecified: Secondary | ICD-10-CM | POA: Diagnosis not present

## 2016-07-13 DIAGNOSIS — J811 Chronic pulmonary edema: Secondary | ICD-10-CM | POA: Diagnosis not present

## 2016-07-13 DIAGNOSIS — R4182 Altered mental status, unspecified: Secondary | ICD-10-CM | POA: Diagnosis not present

## 2016-07-13 DIAGNOSIS — A419 Sepsis, unspecified organism: Secondary | ICD-10-CM | POA: Diagnosis not present

## 2016-07-13 DIAGNOSIS — I491 Atrial premature depolarization: Secondary | ICD-10-CM | POA: Diagnosis not present

## 2016-07-13 DIAGNOSIS — R918 Other nonspecific abnormal finding of lung field: Secondary | ICD-10-CM | POA: Diagnosis not present

## 2016-07-13 DIAGNOSIS — G053 Encephalitis and encephalomyelitis in diseases classified elsewhere: Secondary | ICD-10-CM | POA: Diagnosis not present

## 2016-07-13 DIAGNOSIS — I82441 Acute embolism and thrombosis of right tibial vein: Secondary | ICD-10-CM | POA: Diagnosis not present

## 2016-07-13 DIAGNOSIS — S065X0A Traumatic subdural hemorrhage without loss of consciousness, initial encounter: Secondary | ICD-10-CM | POA: Diagnosis not present

## 2016-07-13 DIAGNOSIS — Z431 Encounter for attention to gastrostomy: Secondary | ICD-10-CM | POA: Diagnosis not present

## 2016-07-13 DIAGNOSIS — C801 Malignant (primary) neoplasm, unspecified: Secondary | ICD-10-CM | POA: Diagnosis not present

## 2016-07-13 DIAGNOSIS — I6200 Nontraumatic subdural hemorrhage, unspecified: Secondary | ICD-10-CM | POA: Diagnosis not present

## 2016-07-13 DIAGNOSIS — R0902 Hypoxemia: Secondary | ICD-10-CM | POA: Diagnosis not present

## 2016-07-13 DIAGNOSIS — E8770 Fluid overload, unspecified: Secondary | ICD-10-CM | POA: Diagnosis not present

## 2016-07-13 DIAGNOSIS — I712 Thoracic aortic aneurysm, without rupture: Secondary | ICD-10-CM | POA: Diagnosis not present

## 2016-07-13 DIAGNOSIS — A86 Unspecified viral encephalitis: Secondary | ICD-10-CM | POA: Diagnosis not present

## 2016-07-13 DIAGNOSIS — D899 Disorder involving the immune mechanism, unspecified: Secondary | ICD-10-CM | POA: Diagnosis not present

## 2016-07-13 DIAGNOSIS — Z7409 Other reduced mobility: Secondary | ICD-10-CM | POA: Diagnosis not present

## 2016-07-13 DIAGNOSIS — G40901 Epilepsy, unspecified, not intractable, with status epilepticus: Secondary | ICD-10-CM | POA: Diagnosis not present

## 2016-07-13 DIAGNOSIS — K069 Disorder of gingiva and edentulous alveolar ridge, unspecified: Secondary | ICD-10-CM | POA: Diagnosis not present

## 2016-07-13 DIAGNOSIS — E874 Mixed disorder of acid-base balance: Secondary | ICD-10-CM | POA: Diagnosis not present

## 2016-07-13 DIAGNOSIS — I82C13 Acute embolism and thrombosis of internal jugular vein, bilateral: Secondary | ICD-10-CM | POA: Diagnosis not present

## 2016-07-13 DIAGNOSIS — T8619 Other complication of kidney transplant: Secondary | ICD-10-CM | POA: Diagnosis not present

## 2016-07-13 DIAGNOSIS — I82621 Acute embolism and thrombosis of deep veins of right upper extremity: Secondary | ICD-10-CM | POA: Diagnosis not present

## 2016-07-13 DIAGNOSIS — I447 Left bundle-branch block, unspecified: Secondary | ICD-10-CM | POA: Diagnosis not present

## 2016-07-13 DIAGNOSIS — I444 Left anterior fascicular block: Secondary | ICD-10-CM | POA: Diagnosis not present

## 2016-07-13 DIAGNOSIS — R1310 Dysphagia, unspecified: Secondary | ICD-10-CM | POA: Diagnosis not present

## 2016-07-13 DIAGNOSIS — I132 Hypertensive heart and chronic kidney disease with heart failure and with stage 5 chronic kidney disease, or end stage renal disease: Secondary | ICD-10-CM | POA: Diagnosis not present

## 2016-07-13 DIAGNOSIS — R251 Tremor, unspecified: Secondary | ICD-10-CM | POA: Diagnosis not present

## 2016-07-13 DIAGNOSIS — Z931 Gastrostomy status: Secondary | ICD-10-CM | POA: Diagnosis not present

## 2016-07-13 DIAGNOSIS — I358 Other nonrheumatic aortic valve disorders: Secondary | ICD-10-CM | POA: Diagnosis not present

## 2016-07-13 DIAGNOSIS — R509 Fever, unspecified: Secondary | ICD-10-CM | POA: Diagnosis not present

## 2016-07-13 DIAGNOSIS — G0481 Other encephalitis and encephalomyelitis: Secondary | ICD-10-CM | POA: Diagnosis not present

## 2016-07-13 DIAGNOSIS — R2231 Localized swelling, mass and lump, right upper limb: Secondary | ICD-10-CM | POA: Diagnosis not present

## 2016-07-13 DIAGNOSIS — F418 Other specified anxiety disorders: Secondary | ICD-10-CM | POA: Diagnosis not present

## 2016-07-13 DIAGNOSIS — R41841 Cognitive communication deficit: Secondary | ICD-10-CM | POA: Diagnosis not present

## 2016-07-13 DIAGNOSIS — E8809 Other disorders of plasma-protein metabolism, not elsewhere classified: Secondary | ICD-10-CM | POA: Diagnosis not present

## 2016-07-13 DIAGNOSIS — I5042 Chronic combined systolic (congestive) and diastolic (congestive) heart failure: Secondary | ICD-10-CM | POA: Diagnosis present

## 2016-07-13 DIAGNOSIS — Z9911 Dependence on respirator [ventilator] status: Secondary | ICD-10-CM | POA: Diagnosis not present

## 2016-07-13 DIAGNOSIS — B2709 Gammaherpesviral mononucleosis with other complications: Secondary | ICD-10-CM | POA: Diagnosis not present

## 2016-07-13 DIAGNOSIS — I10 Essential (primary) hypertension: Secondary | ICD-10-CM | POA: Diagnosis not present

## 2016-07-13 DIAGNOSIS — J189 Pneumonia, unspecified organism: Secondary | ICD-10-CM | POA: Diagnosis not present

## 2016-07-13 DIAGNOSIS — I6601 Occlusion and stenosis of right middle cerebral artery: Secondary | ICD-10-CM | POA: Diagnosis not present

## 2016-07-13 DIAGNOSIS — E1165 Type 2 diabetes mellitus with hyperglycemia: Secondary | ICD-10-CM | POA: Diagnosis not present

## 2016-07-13 DIAGNOSIS — R7989 Other specified abnormal findings of blood chemistry: Secondary | ICD-10-CM | POA: Diagnosis not present

## 2016-07-13 DIAGNOSIS — N183 Chronic kidney disease, stage 3 (moderate): Secondary | ICD-10-CM | POA: Diagnosis not present

## 2016-07-13 DIAGNOSIS — J9811 Atelectasis: Secondary | ICD-10-CM | POA: Diagnosis not present

## 2016-07-13 DIAGNOSIS — Z5181 Encounter for therapeutic drug level monitoring: Secondary | ICD-10-CM | POA: Diagnosis not present

## 2016-07-13 DIAGNOSIS — I82C21 Chronic embolism and thrombosis of right internal jugular vein: Secondary | ICD-10-CM | POA: Diagnosis not present

## 2016-07-13 DIAGNOSIS — I82C23 Chronic embolism and thrombosis of internal jugular vein, bilateral: Secondary | ICD-10-CM | POA: Diagnosis not present

## 2016-07-13 DIAGNOSIS — M6281 Muscle weakness (generalized): Secondary | ICD-10-CM | POA: Diagnosis not present

## 2016-07-13 DIAGNOSIS — R001 Bradycardia, unspecified: Secondary | ICD-10-CM | POA: Diagnosis not present

## 2016-07-13 DIAGNOSIS — I728 Aneurysm of other specified arteries: Secondary | ICD-10-CM | POA: Diagnosis not present

## 2016-07-13 DIAGNOSIS — Z794 Long term (current) use of insulin: Secondary | ICD-10-CM | POA: Diagnosis not present

## 2016-07-13 DIAGNOSIS — I639 Cerebral infarction, unspecified: Secondary | ICD-10-CM | POA: Diagnosis not present

## 2016-07-13 DIAGNOSIS — I129 Hypertensive chronic kidney disease with stage 1 through stage 4 chronic kidney disease, or unspecified chronic kidney disease: Secondary | ICD-10-CM | POA: Diagnosis not present

## 2016-07-13 DIAGNOSIS — Z7901 Long term (current) use of anticoagulants: Secondary | ICD-10-CM | POA: Diagnosis not present

## 2016-07-13 DIAGNOSIS — I130 Hypertensive heart and chronic kidney disease with heart failure and stage 1 through stage 4 chronic kidney disease, or unspecified chronic kidney disease: Secondary | ICD-10-CM | POA: Diagnosis not present

## 2016-07-13 DIAGNOSIS — D6959 Other secondary thrombocytopenia: Secondary | ICD-10-CM | POA: Diagnosis not present

## 2016-07-13 DIAGNOSIS — D496 Neoplasm of unspecified behavior of brain: Secondary | ICD-10-CM | POA: Diagnosis not present

## 2016-07-13 DIAGNOSIS — I5032 Chronic diastolic (congestive) heart failure: Secondary | ICD-10-CM | POA: Diagnosis not present

## 2016-07-13 DIAGNOSIS — N189 Chronic kidney disease, unspecified: Secondary | ICD-10-CM | POA: Diagnosis not present

## 2016-07-13 DIAGNOSIS — R0689 Other abnormalities of breathing: Secondary | ICD-10-CM | POA: Diagnosis not present

## 2016-07-13 DIAGNOSIS — R9431 Abnormal electrocardiogram [ECG] [EKG]: Secondary | ICD-10-CM | POA: Diagnosis not present

## 2016-07-13 DIAGNOSIS — W19XXXA Unspecified fall, initial encounter: Secondary | ICD-10-CM | POA: Diagnosis not present

## 2016-07-13 DIAGNOSIS — E041 Nontoxic single thyroid nodule: Secondary | ICD-10-CM | POA: Diagnosis not present

## 2016-07-13 DIAGNOSIS — I82B11 Acute embolism and thrombosis of right subclavian vein: Secondary | ICD-10-CM | POA: Diagnosis not present

## 2016-07-13 DIAGNOSIS — R404 Transient alteration of awareness: Secondary | ICD-10-CM | POA: Diagnosis not present

## 2016-07-13 DIAGNOSIS — E1122 Type 2 diabetes mellitus with diabetic chronic kidney disease: Secondary | ICD-10-CM | POA: Diagnosis not present

## 2016-07-13 DIAGNOSIS — R569 Unspecified convulsions: Secondary | ICD-10-CM | POA: Diagnosis not present

## 2016-07-13 DIAGNOSIS — F0281 Dementia in other diseases classified elsewhere with behavioral disturbance: Secondary | ICD-10-CM | POA: Diagnosis not present

## 2016-07-13 DIAGNOSIS — R0603 Acute respiratory distress: Secondary | ICD-10-CM | POA: Diagnosis not present

## 2016-07-13 DIAGNOSIS — G130 Paraneoplastic neuromyopathy and neuropathy: Secondary | ICD-10-CM | POA: Diagnosis not present

## 2016-07-13 DIAGNOSIS — I808 Phlebitis and thrombophlebitis of other sites: Secondary | ICD-10-CM | POA: Diagnosis not present

## 2016-07-13 DIAGNOSIS — Z9981 Dependence on supplemental oxygen: Secondary | ICD-10-CM | POA: Diagnosis not present

## 2016-07-13 DIAGNOSIS — R1909 Other intra-abdominal and pelvic swelling, mass and lump: Secondary | ICD-10-CM | POA: Diagnosis not present

## 2016-07-13 DIAGNOSIS — R278 Other lack of coordination: Secondary | ICD-10-CM | POA: Diagnosis not present

## 2016-07-13 DIAGNOSIS — Z959 Presence of cardiac and vascular implant and graft, unspecified: Secondary | ICD-10-CM | POA: Diagnosis not present

## 2016-07-13 DIAGNOSIS — I651 Occlusion and stenosis of basilar artery: Secondary | ICD-10-CM | POA: Diagnosis not present

## 2016-07-13 DIAGNOSIS — D804 Selective deficiency of immunoglobulin M [IgM]: Secondary | ICD-10-CM | POA: Diagnosis not present

## 2016-07-13 DIAGNOSIS — E119 Type 2 diabetes mellitus without complications: Secondary | ICD-10-CM | POA: Diagnosis not present

## 2016-07-13 DIAGNOSIS — R739 Hyperglycemia, unspecified: Secondary | ICD-10-CM | POA: Diagnosis not present

## 2016-07-13 DIAGNOSIS — E870 Hyperosmolality and hypernatremia: Secondary | ICD-10-CM | POA: Diagnosis not present

## 2016-07-13 DIAGNOSIS — Z992 Dependence on renal dialysis: Secondary | ICD-10-CM | POA: Diagnosis not present

## 2016-07-13 DIAGNOSIS — D649 Anemia, unspecified: Secondary | ICD-10-CM | POA: Diagnosis not present

## 2016-07-13 DIAGNOSIS — Z95828 Presence of other vascular implants and grafts: Secondary | ICD-10-CM | POA: Diagnosis not present

## 2016-07-13 DIAGNOSIS — R102 Pelvic and perineal pain: Secondary | ICD-10-CM | POA: Diagnosis not present

## 2016-07-13 DIAGNOSIS — L89152 Pressure ulcer of sacral region, stage 2: Secondary | ICD-10-CM | POA: Diagnosis not present

## 2016-07-13 DIAGNOSIS — Z940 Kidney transplant status: Secondary | ICD-10-CM | POA: Diagnosis not present

## 2016-07-13 DIAGNOSIS — R748 Abnormal levels of other serum enzymes: Secondary | ICD-10-CM | POA: Diagnosis not present

## 2016-07-13 DIAGNOSIS — R454 Irritability and anger: Secondary | ICD-10-CM | POA: Diagnosis not present

## 2016-07-13 DIAGNOSIS — E891 Postprocedural hypoinsulinemia: Secondary | ICD-10-CM | POA: Diagnosis not present

## 2016-07-13 DIAGNOSIS — I82C19 Acute embolism and thrombosis of unspecified internal jugular vein: Secondary | ICD-10-CM | POA: Diagnosis not present

## 2016-07-13 DIAGNOSIS — E875 Hyperkalemia: Secondary | ICD-10-CM | POA: Diagnosis not present

## 2016-07-13 DIAGNOSIS — Z7952 Long term (current) use of systemic steroids: Secondary | ICD-10-CM | POA: Diagnosis not present

## 2016-07-13 DIAGNOSIS — G939 Disorder of brain, unspecified: Secondary | ICD-10-CM | POA: Diagnosis not present

## 2016-07-13 DIAGNOSIS — I6203 Nontraumatic chronic subdural hemorrhage: Secondary | ICD-10-CM | POA: Diagnosis not present

## 2016-07-13 DIAGNOSIS — I82C11 Acute embolism and thrombosis of right internal jugular vein: Secondary | ICD-10-CM | POA: Diagnosis not present

## 2016-07-13 DIAGNOSIS — Z452 Encounter for adjustment and management of vascular access device: Secondary | ICD-10-CM | POA: Diagnosis not present

## 2016-07-13 DIAGNOSIS — K219 Gastro-esophageal reflux disease without esophagitis: Secondary | ICD-10-CM | POA: Diagnosis not present

## 2016-07-13 DIAGNOSIS — K068 Other specified disorders of gingiva and edentulous alveolar ridge: Secondary | ICD-10-CM | POA: Diagnosis not present

## 2016-07-13 DIAGNOSIS — J9509 Other tracheostomy complication: Secondary | ICD-10-CM | POA: Diagnosis not present

## 2016-07-13 DIAGNOSIS — Z934 Other artificial openings of gastrointestinal tract status: Secondary | ICD-10-CM | POA: Diagnosis not present

## 2016-07-13 DIAGNOSIS — N949 Unspecified condition associated with female genital organs and menstrual cycle: Secondary | ICD-10-CM | POA: Diagnosis not present

## 2016-07-13 DIAGNOSIS — E785 Hyperlipidemia, unspecified: Secondary | ICD-10-CM | POA: Diagnosis not present

## 2016-07-13 DIAGNOSIS — L7632 Postprocedural hematoma of skin and subcutaneous tissue following other procedure: Secondary | ICD-10-CM | POA: Diagnosis not present

## 2016-07-13 DIAGNOSIS — L89312 Pressure ulcer of right buttock, stage 2: Secondary | ICD-10-CM | POA: Diagnosis not present

## 2016-07-13 DIAGNOSIS — R319 Hematuria, unspecified: Secondary | ICD-10-CM | POA: Diagnosis not present

## 2016-07-13 DIAGNOSIS — I611 Nontraumatic intracerebral hemorrhage in hemisphere, cortical: Secondary | ICD-10-CM | POA: Diagnosis not present

## 2016-07-13 DIAGNOSIS — I82409 Acute embolism and thrombosis of unspecified deep veins of unspecified lower extremity: Secondary | ICD-10-CM | POA: Diagnosis not present

## 2016-07-13 DIAGNOSIS — I6201 Nontraumatic acute subdural hemorrhage: Secondary | ICD-10-CM | POA: Diagnosis not present

## 2016-07-13 DIAGNOSIS — G249 Dystonia, unspecified: Secondary | ICD-10-CM | POA: Diagnosis not present

## 2016-07-13 LAB — GLUCOSE, CAPILLARY
GLUCOSE-CAPILLARY: 102 mg/dL — AB (ref 65–99)
GLUCOSE-CAPILLARY: 138 mg/dL — AB (ref 65–99)
Glucose-Capillary: 108 mg/dL — ABNORMAL HIGH (ref 65–99)

## 2016-07-13 MED ORDER — LORAZEPAM 1 MG PO TABS: 1.0000 mg | ORAL_TABLET | Freq: Four times a day (QID) | ORAL | Status: DC | PRN

## 2016-07-13 MED ORDER — LOSARTAN POTASSIUM 50 MG PO TABS
100.0000 mg | ORAL_TABLET | Freq: Every day | ORAL | Status: DC
  Administered 2016-07-13: 100 mg via ORAL
  Filled 2016-07-13: qty 2

## 2016-07-13 NOTE — Accreditation Note (Signed)
Writer alerted pt's daughter, Alm Bustard, that her mother will be transferred to [place].

## 2016-07-13 NOTE — Progress Notes (Signed)
Occupational Therapy Treatment Patient Details Name: [name] [name] MRN: [id number] DOB: [birth year] Today's Date: 07/13/2016    History of present illness [name] [name] is a 61 y.o. female with medical history significant of hypertension, hyperlipidemia, diabetes mellitus, GERD, depression, anxiety, thoracic aortic aneurysm, CKD-IV, anemia, carotid artery stenosis, history of kidney transplantation on immunosuppressants, dCHF. She presented with altered mental status on 07/07/17.[name] recent hospitalization for anxiety.Rapid response called for episode of shaking, decreased communication. Patient being followed by neurology and psychiatry for definitive diagnoses.   OT comments  Pt making steady progress. Able to ambulate to bathroom and assist with ADL with min A. Ambulated @ 124ft with min A. Pt appeared anxious during session, but participated and completed all tasks. Perseverates at times. At risk for falls. Continue to recommend CIR for rehab to maximize functional level of independence. Will continue to follow acutely to address established goals and facilitate DC to next venue of care.   Follow Up Recommendations  CIR    Equipment Recommendations  3 in 1 bedside commode    Recommendations for Other Services Rehab consult    Precautions / Restrictions Precautions Precautions: Fall       Mobility Bed Mobility Overal bed mobility: Needs Assistance       Supine to sit: Supervision        Transfers Overall transfer level: Needs assistance Equipment used: 1 person hand held assist Transfers: Sit to/from Stand Sit to Stand: Min assist              Balance Overall balance assessment: History of Falls;Needs assistance           Standing balance-Leahy Scale: Poor                             ADL either performed or assessed with clinical judgement   ADL Overall ADL's : Needs assistance/impaired             Lower Body Bathing: Minimal  assistance;Sit to/from stand       Lower Body Dressing: Minimal assistance;Sit to/from stand   Toilet Transfer: Minimal assistance;Ambulation;Comfort height toilet   Toileting- Clothing Manipulation and Hygiene: Minimal assistance;Sit to/from stand Toileting - Clothing Manipulation Details (indicate cue type and reason): Pt assisted with changing brief, which was soiled     Functional mobility during ADLs: Minimal assistance General ADL Comments: HHA with pt holding onto IV pole, ambulated @120  ft     Vision       Perception     Praxis      Cognition Arousal/Alertness: Awake/alert Behavior During Therapy: Anxious;Flat affect Overall Cognitive Status: Impaired/Different from baseline Area of Impairment: Attention;Memory;Following commands;Safety/judgement;Awareness;Problem solving                   Current Attention Level: Sustained Memory: Decreased recall of precautions;Decreased short-term memory Following Commands: Follows one step commands with increased time Safety/Judgement: Decreased awareness of safety;Decreased awareness of deficits Awareness: Emergent Problem Solving: Slow processing;Difficulty sequencing General Comments: Increased ability to initiate and complete functional tasks with min vc  On returning to room, pt required physical assist to safely sit on EOB. Held onto wires from IV pole and reqruied tactile cues to release.         Exercises     Shoulder Instructions       General Comments      Pertinent Vitals/ Pain       Pain Assessment: Faces Faces Pain Scale:  No hurt  Home Living                                          Prior Functioning/Environment              Frequency  Min 2X/week        Progress Toward Goals  OT Goals(current goals can now be found in the care plan section)  Progress towards OT goals: Progressing toward goals  Acute Rehab OT Goals Patient Stated Goal: per daughter, to get  therapy, find out what is wrong OT Goal Formulation: With patient/family Time For Goal Achievement: 07/18/16 Potential to Achieve Goals: Good ADL Goals Pt Will Perform Grooming: with min guard assist;standing Pt Will Transfer to Toilet: with min guard assist;ambulating;bedside commode Pt Will Perform Toileting - Clothing Manipulation and hygiene: with min guard assist;sit to/from stand Additional ADL Goal #1: pt will complete ADL with set up/supervision with min guard assistance Additional ADL Goal #2: pt will follow functional commands in context with one verbal and one visual cue and not need any cues for safety  Plan Discharge plan remains appropriate    Co-evaluation                 End of Session Equipment Utilized During Treatment: Gait belt  OT Visit Diagnosis: Unsteadiness on feet (R26.81);Muscle weakness (generalized) (M62.81);Cognitive communication deficit (R41.841)   Activity Tolerance Patient tolerated treatment well   Patient Left in bed;with call bell/phone within reach;with nursing/sitter in room;with family/visitor present (tele sitter)   Nurse Communication Mobility status;Other (comment) (ADL status)        Time: 9977-4142 OT Time Calculation (min): 19 min  Charges: OT General Charges $OT Visit: 1 Procedure OT Treatments $Self Care/Home Management : 8-22 mins  [facility], OT/L  [phone] 07/13/2016   Gelsey Amyx,HILLARY 07/13/2016, 10:17 AM

## 2016-07-13 NOTE — Progress Notes (Signed)
PROGRESS NOTE    [name]  LOV:[id number] DOB: June 28, 1955 DOA: 07/07/2016 PCP: Annye Asa, MD   Brief Narrative: [name] [name] is a 61 y.o. female with medical history significant of hypertension, hyperlipidemia, diabetes mellitus, GERD, depression, anxiety, thoracic aortic aneurysm, CKD-IV, anemia, carotid artery stenosis, history of kidney transplantation on immunosuppressants, dCHF. She presented with altered mental status.   Assessment & Plan:   Principal Problem:   Acute encephalopathy Active Problems:   Essential hypertension   History of renal transplant   Diabetes mellitus type II, controlled ([place])   Acute renal failure superimposed on stage 4 chronic kidney disease (HCC)   Chronic diastolic CHF (congestive heart failure) (HCC)   Anemia   Altered mental status   Acute encephalopathy Unknown cause. Patient has workup for MS vs other demyelinating disease vs autoimmune as an outpatient. Previously worked up on previous admission. MRI unchanged from previous. ?seizure. EEG not suggestive of a seizure, but showed slowing. MRI cervical spine significant for nothing new that would contribute to symptoms. CSF fluid traumatic, studies pending (IgG index, oligoclonal bands). VDRL negative. JC virus negative. Patient requesting transfer to [facility] yesterday and discussed with Dr. Dione Booze of Neurology who declined transfer secondary to lack of bed availability and result of JC virus PCR). -neurology recommendations -neuro checks -continue ativan prn -call [facility] today to discuss possible transfer  Essential hypertension -continue amlodipine, hydralazine and lasix -restart losartan (home medication)  History of renal transplant -continue Cellcept, Tacrolimus and Prednisone -will check a mycophenolic acid level  Acute on chronic kidney disease, stage IV Acute failure resolved. Currently stable.  Diabetes mellitus, type 2 Last A1c of  6.2 -continue SSI  Chronic diastolic CHF Echocardiogram on 12/12/14 with EF of 60-65% and grade 1 diastolic dysfunction -continue Lasix  Anemia Stable. No bleeding.  Fever No source. Still occurring. Does not seem infectious, however, patient is immunocompromised. Still spiking fevers but trending down -blood culture pending -urine culture pending -consult ID  DVT prophylaxis: heparin Code Status: Full code Family Communication: son at bedside Disposition Plan: Discharge pending workup for fevers   Consultants:   Neurology  Psychiatry  Procedures:   None  Antimicrobials:  None    Subjective: No events overnight per patient. She reports no feelings of anxiety. No chest pain.  Objective: Vitals:   07/12/16 2250 07/13/16 0427 07/13/16 0521 07/13/16 0652  BP: (!) 158/93  (!) 183/92 (!) 172/89  Pulse: 87  89   Resp: 20  20   Temp: 99.2 F (37.3 C)  99.5 F (37.5 C)   TempSrc: Oral  Oral   SpO2: 100%  95%   Weight:  62.9 kg (138 lb 9.6 oz)    Height:        Intake/Output Summary (Last 24 hours) at 07/13/16 1204 Last data filed at 07/13/16 0811  Gross per 24 hour  Intake             2220 ml  Output                2 ml  Net             2218 ml   Filed Weights   07/09/16 0603 07/11/16 0502 07/13/16 0427  Weight: 65 kg (143 lb 3.2 oz) 63.4 kg (139 lb 12.8 oz) 62.9 kg (138 lb 9.6 oz)    Examination:  General exam: Appears slightly agitated but comfortable  Respiratory system: Clear to auscultation. Respiratory effort normal. Cardiovascular system: S1 &  S2 heard, RRR. No murmurs. Gastrointestinal system: Abdomen is nondistended, soft and nontender. Normal bowel sounds heard. Central nervous system: Alert and oriented to person, place and year. Fails finger-to-nose bilaterally.  Extremities: No edema. No calf tenderness Skin: No cyanosis. No rashes Psychiatry: Judgement and insight appear normal. Mood & affect flat and anxious    Data Reviewed: I have  personally reviewed following labs and imaging studies  CBC:  Recent Labs Lab 07/07/16 1830 07/08/16 0450 07/10/16 0615 07/12/16 1613  WBC 6.7 7.5 6.5 7.8  NEUTROABS  --   --   --  5.3  HGB 9.8* 9.1* 9.5* 8.9*  HCT 30.7* 27.0* 29.0* 26.9*  MCV 85.8 86.8 87.3 87.3  PLT 336 272 247 419   Basic Metabolic Panel:  Recent Labs Lab 07/07/16 1830 07/08/16 0450 07/10/16 0615 07/11/16 0943 07/12/16 0935  NA 138 136 139 140 141  K 4.2 3.6 3.1* 3.1* 3.6  CL 103 104 105 109 110  CO2 26 24 21* 24 22  GLUCOSE 200* 120* 97 198* 109*  BUN 30* 31* 26* 21* 20  CREATININE 3.11* 2.83* 2.65* 2.60* 2.52*  CALCIUM 9.2 8.9 8.4* 8.6* 8.8*  MG  --   --   --   --  1.0*   GFR: Estimated Creatinine Clearance: 21.9 mL/min (A) (by C-G formula based on SCr of 2.52 mg/dL (H)). Liver Function Tests:  Recent Labs Lab 07/07/16 1830  AST 22  ALT 22  ALKPHOS 43  BILITOT 0.5  PROT 7.2  ALBUMIN 4.1   No results for input(s): LIPASE, AMYLASE in the last 168 hours.  Recent Labs Lab 07/08/16 0031  AMMONIA 16   Coagulation Profile: No results for input(s): INR, PROTIME in the last 168 hours. Cardiac Enzymes: No results for input(s): CKTOTAL, CKMB, CKMBINDEX, TROPONINI in the last 168 hours. BNP (last 3 results) No results for input(s): PROBNP in the last 8760 hours. HbA1C: No results for input(s): HGBA1C in the last 72 hours. CBG:  Recent Labs Lab 07/12/16 0750 07/12/16 1208 07/12/16 1656 07/12/16 2032 07/13/16 0734  GLUCAP 91 155* 114* 117* 102*   Lipid Profile: No results for input(s): CHOL, HDL, LDLCALC, TRIG, CHOLHDL, LDLDIRECT in the last 72 hours. Thyroid Function Tests: No results for input(s): TSH, T4TOTAL, FREET4, T3FREE, THYROIDAB in the last 72 hours. Anemia Panel: No results for input(s): VITAMINB12, FOLATE, FERRITIN, TIBC, IRON, RETICCTPCT in the last 72 hours. Sepsis Labs: No results for input(s): PROCALCITON, LATICACIDVEN in the last 168 hours.  Recent Results  (from the past 240 hour(s))  Urine culture     Status: Abnormal   Collection Time: 07/08/16  1:10 AM  Result Value Ref Range Status   Specimen Description URINE, RANDOM  Final   Special Requests NONE  Final   Culture MULTIPLE SPECIES PRESENT, SUGGEST RECOLLECTION (A)  Final   Report Status 07/09/2016 FINAL  Final  Culture, blood (routine x 2)     Status: None (Preliminary result)   Collection Time: 07/11/16  9:42 AM  Result Value Ref Range Status   Specimen Description BLOOD LEFT ARM  Final   Special Requests   Final    BOTTLES DRAWN AEROBIC AND ANAEROBIC Blood Culture adequate volume   Culture   Final    NO GROWTH < 24 HOURS Performed at [facility], [address] [place], [place] [address]    Report Status PENDING  Incomplete  Culture, blood (routine x 2)     Status: None (Preliminary result)  Collection Time: 07/11/16  9:49 AM  Result Value Ref Range Status   Specimen Description BLOOD LEFT ARM  Final   Special Requests   Final    BOTTLES DRAWN AEROBIC AND ANAEROBIC Blood Culture adequate volume   Culture   Final    NO GROWTH < 24 HOURS Performed at [facility], [address] [place], [place] [address]    Report Status PENDING  Incomplete         Radiology Studies: No results found.      Scheduled Meds: . amLODipine  10 mg Oral Daily  . cholecalciferol  1,000 Units Oral QODAY  . clonazePAM  1 mg Oral BID  . escitalopram  10 mg Oral Daily  . fenofibrate  160 mg Oral Daily  . furosemide  40 mg Oral BID  . heparin  5,000 Units Subcutaneous Q8H  . hydrALAZINE  50 mg Oral TID  . insulin aspart  0-5 Units Subcutaneous QHS  . insulin aspart  0-9 Units Subcutaneous TID WC  . isosorbide mononitrate  60 mg Oral Daily  . magnesium oxide  400 mg Oral Daily  . multivitamin with minerals   Oral Daily  . mycophenolate  750 mg Oral BID  . predniSONE  5 mg Oral Q M,W,F  . sodium chloride flush  3 mL Intravenous Q12H  . tacrolimus  5 mg Oral BID    Continuous Infusions: . sodium chloride 75 mL/hr at 07/13/16 1018     LOS: 5 days     Cordelia Poche, MD [facility] 07/13/2016, 12:04 PM Pager: [phone]  If 7PM-7AM, please contact night-coverage [email or web address] Password [facility] 07/13/2016, 12:04 PM

## 2016-07-13 NOTE — Discharge Summary (Signed)
Physician Discharge Summary  [name] [name]:[id number] DOB: 02-11-1956 DOA: 07/07/2016  PCP: Annye Asa, MD  Admit date: 07/07/2016 Discharge date: 07/13/2016  Admitted From: Home Disposition: [facility]  Follow-up:  1. Please follow up on the following pending results:  1. Oligoclonal bands (4/19) 2. IgG index (4/19) 3. autoimmune encephalopathy panel (4/2) 4. blood culture x2 (4/20) 5. urine culture (4/22) 6. Mycophenolic acid level   Discharge Condition: Stable CODE STATUS: Full code Diet recommendation: Heart healthy   Brief/Interim Summary:  Admission HPI written by Ivor Costa, MD   Chief Complaint:  AMS  HPI: [name] [name] is a 61 y.o. female with medical history significant of hypertension, hyperlipidemia, diabetes mellitus, GERD, depression, anxiety, thoracic aortic aneurysm, CKD-IV, anemia, carotid artery stenosis, history of kidney transplantation on immunosuppressants, dCHF, who presents AMS.  Patient was recently hospitalized from 3/28-06/23/16 due to altered mental status. In that admission, pt had LP. She had oligoclonal bands on CSF fluid and MRI Brain findings suggestive of Possible MS vs other Demyelinating disorder Vs autoimmune process (graft-versus-host). Pt was not started with high-dose steroids as patient appeared to be improving clinically from a neuropsychiatric point.  Per her sister-in-law, patient become confused again today. Her talking does not make any sense. She seems to have blurry vision per her sister-in-law. No unilateral weakness, numbness or tingling sensations. No hearing loss. Patient does not have chest pain, shortness breath, nausea, vomiting, abdominal pain, diarrhea, symptoms of UTI.  ED Course: pt was found to have WBC 6.7, positive urinalysis for benzo, Tylenol level less than 10, salicylate level less than 7, negative urinalysis, ammonia level 16, alcohol level less than 5, worsening renal function,  temperature normal, tachycardia, tachypnea, oxygen saturation 96% on room air. CT head is negative for acute intracranial abnormalities. Patient is placed on telemetry bed for observation. Neurology, Dr. Nicole Kindred was consulted by EDP, who recommended MRI for brain    Hospital course:  Acute encephalopathy Unknown cause. Patient has workup for MS vs other demyelinating disease vs autoimmune as an outpatient. Previously worked up on previous admission. MRI brain unchanged from previous. Working diagnosis by neurology is PML possibly secondary to immunosuppression secondary to bilateral kidney transplants. JC virus negative. EEG performed which showed no evidence of active seizures. MRI cervical spine significant for subtle T2 accentuation in left cerebellar white matter (seen on prior brain MRI). CSF fluid traumatic with cell count significant for 26 WBC, 1685 RBC, 90 lymphs, 64 protein, 54 glucose. JC virus, VDRL negative. Klonopin prescribed, however, continuously declined by patient/family. Ativan as needed for anxiety. Family requesting transfer to [facility] where she had her renal transplants. Since neurology recommending possibly adjustment of immunosuppressant medications, this would better suit patient. Discussed case with neurology, transplant team and nephrology of [facility]. Accepting physician is Dr. Berline Lopes or nephrology.  Recommend continued PT, OT and SLP (cognitive/language)  Essential hypertension Continued amlodipine, hydralazine and lasix. Losartan initially held and restarted secondary to stable creatinine and uncontrolled hypertension.  History of renal transplant Continued Cellcept, Tacrolimus and Prednisone. Mycophenolic acid level obtained and pending.  Acute on chronic kidney disease, stage IV Acute failure resolved. Currently stable.  Diabetes mellitus, type 2 Last A1c of 6.2. Sliding scale insulin while inpatient.  Chronic diastolic  CHF Echocardiogram on 12/12/14 with EF of 60-65% and grade 1 diastolic dysfunction. Continued lasix  Anemia of chronic disease Secondary to chronic kidney disease. Stable. No bleeding.  Hypomagnesemia Given IV mag sulfate. Likely contributing to  hypokalemia. Continue to trend.  Fever No source. Still occurring. No evidence to suggest infectious cause. MRI does not support herpes encephalitis. No HSV obtained on previous CSF fluid sample. Antimicrobials not started secondary to low likelihood of infectious cause. Discussed with Dr. Linus Salmons of infectious disease and recommendations include to watch fevers. Blood and urine cultures obtained and are negative to date.   Discharge Diagnoses:  Principal Problem:   Acute encephalopathy Active Problems:   Essential hypertension   History of renal transplant   Diabetes mellitus type II, controlled ([place])   Acute renal failure superimposed on stage 4 chronic kidney disease (HCC)   Chronic diastolic CHF (congestive heart failure) (HCC)   Anemia   Altered mental status   Leukoencephalopathy    Discharge Instructions   Allergies as of 07/13/2016      Reactions   Lisinopril Shortness Of Breath, Swelling, Other (See Comments)   Throat irritation also. Patient takes losartan and tolerates fine.   Daypro [oxaprozin] Dermatitis, Other (See Comments)   hives      Medication List    TAKE these medications   acetaminophen 500 MG tablet Commonly known as:  TYLENOL Take 1,000 mg by mouth every 6 (six) hours as needed for moderate pain.   amLODipine 10 MG tablet Commonly known as:  NORVASC Take 10 mg by mouth daily.   cholecalciferol 1000 units tablet Commonly known as:  VITAMIN D Take 1,000 Units by mouth every other day.   clonazePAM 1 MG tablet Commonly known as:  KLONOPIN Take 1 tablet (1 mg total) by mouth 2 (two) times daily.   cyclobenzaprine 10 MG tablet Commonly known as:  FLEXERIL TAKE 1 TABLET (10 MG TOTAL) BY MOUTH 3  (THREE) TIMES DAILY AS NEEDED FOR MUSCLE SPASMS.   escitalopram 10 MG tablet Commonly known as:  LEXAPRO Take 1 tablet (10 mg total) by mouth daily.   fenofibrate 160 MG tablet Take 160 mg by mouth daily.   furosemide 40 MG tablet Commonly known as:  LASIX 1 tab twice a day What changed:  how much to take  how to take this  when to take this  additional instructions   hydrALAZINE 50 MG tablet Commonly known as:  APRESOLINE Take 1 tablet (50 mg total) by mouth 3 (three) times daily.   isosorbide mononitrate 60 MG 24 hr tablet Commonly known as:  IMDUR Take 1 tablet (60 mg total) by mouth daily.   LORazepam 1 MG tablet Commonly known as:  ATIVAN Take 1 tablet (1 mg total) by mouth every 6 (six) hours as needed for anxiety.   losartan 100 MG tablet Commonly known as:  COZAAR Take 1 tablet (100 mg total) by mouth daily.   Magnesium 250 MG Tabs Take 500 mg by mouth daily.   meclizine 25 MG tablet Commonly known as:  ANTIVERT TAKE 1 TABLET (25 MG TOTAL) BY MOUTH 3 (THREE) TIMES DAILY AS NEEDED FOR DIZZINESS.   multivitamin tablet Take 1 tablet by mouth daily.   mycophenolate 250 MG capsule Commonly known as:  CELLCEPT Take 750 mg by mouth 2 (two) times daily.   nitroGLYCERIN 0.4 MG SL tablet Commonly known as:  NITROSTAT Place 1 tablet (0.4 mg total) under the tongue every 5 (five) minutes as needed for chest pain.   potassium chloride SA 20 MEQ tablet Commonly known as:  K-DUR,KLOR-CON Take 1 tablet (20 mEq total) by mouth 2 (two) times daily.   predniSONE 5 MG tablet Commonly known as:  DELTASONE Take  5 mg by mouth every Monday, Wednesday, and Friday.   tacrolimus 1 MG capsule Commonly known as:  PROGRAF Take 5 mg by mouth 2 (two) times daily.       Allergies  Allergen Reactions  . Lisinopril Shortness Of Breath, Swelling and Other (See Comments)    Throat irritation also. Patient takes losartan and tolerates fine.  [name] [Oxaprozin]  Dermatitis and Other (See Comments)    hives    Consultations:  Neurology  Infectious disease (phone consult)   Procedures/Studies: Ct Head Wo Contrast  Result Date: 07/07/2016 CLINICAL DATA:  Altered mental status, abnormal behavior, forgetfulness. History of end-stage renal disease on dialysis, status post renal transplant. EXAM: CT HEAD WITHOUT CONTRAST TECHNIQUE: Contiguous axial images were obtained from the base of the skull through the vertex without intravenous contrast. COMPARISON:  MRI of the head June 18, 2016 and MRI of the head June 11, 2016 and MRI of the head May 09, 2014 FINDINGS: BRAIN: No intraparenchymal hemorrhage, mass effect nor midline shift. The ventricles and sulci are normal for age. Patchy supratentorial white matter hypodensities. No acute large vascular territory infarcts. No abnormal extra-axial fluid collections. Basal cisterns are patent. VASCULAR: Mild calcific atherosclerosis of the carotid siphons. SKULL: No skull fracture. No significant scalp soft tissue swelling. SINUSES/ORBITS: The mastoid air-cells and included paranasal sinuses are well-aerated.The included ocular globes and orbital contents are non-suspicious. OTHER: None. IMPRESSION: No acute intracranial process. Stable moderate white matter changes better characterized on recent MRI of the head. Electronically Signed   By: Elon Alas M.D.   On: 07/07/2016 22:59   Mr Brain Wo Contrast  Result Date: 07/08/2016 CLINICAL DATA:  Altered mental status.  History of renal transplant. EXAM: MRI HEAD WITHOUT CONTRAST TECHNIQUE: Multiplanar, multiecho pulse sequences of the brain and surrounding structures were obtained without intravenous contrast. COMPARISON:  06/18/2016 FINDINGS: Brain: Bilateral white matter disease with periventricular rim and more patchy, hazy deep white matter hyperintensities. Signal abnormality intermittently seen in the the juxta cortical brain. There is FLAIR hyperintensity  in the left pons and in the left middle cerebellar peduncle, the lateral neighboring a developmental venous anomaly. There has been no progression since prior. No asymmetric white matter disease typical of PML. Findings again could be related to chronic small vessel ischemia, demyelinating disease (patient has oligoclonal bands), or other autoimmune process/inflammatory vasculopathy. No infarct or acutely or in the interim. No hemorrhage, hydrocephalus, or mass like findings. Vascular: Preserved flow voids. Skull and upper cervical spine: Negative Sinuses/Orbits: Negative IMPRESSION: No acute finding or change in moderate cerebral white matter disease and mild infratentorial signal abnormality. Differential diagnosis described above. Electronically Signed   By: Monte Fantasia M.D.   On: 07/08/2016 09:05   Mr Brain Wo Contrast  Result Date: 06/18/2016 CLINICAL DATA:  Worsening mental status and hallucinations. Seizures. Suspected multiple sclerosis. History of hypertension, hyperlipidemia, diabetes, end-stage renal disease on dialysis, status post transplant. EXAM: MRI HEAD WITHOUT CONTRAST TECHNIQUE: Multiplanar, multiecho pulse sequences of the brain and surrounding structures were obtained without intravenous contrast. COMPARISON:  MRI of the brain June 11, 2016 FINDINGS: BRAIN: Subcentimeter T2 hyperintensities LEFT cerebellum, patchy to confluent supratentorial and faint pontine white matter FLAIR T2 hyperintensities, which do not radiate from the periventricular margin and do not demonstrate T1 black holes of demyelination. No susceptibility artifact to suggest hemorrhage. No parenchymal brain volume loss for age. No midline shift, mass effect or masses. No abnormal extra-axial fluid collections. Hippocampi demonstrate symmetric normal size, morphology and  signal. SKULL AND UPPER CERVICAL SPINE: No abnormal sellar expansion. No suspicious calvarial bone marrow signal. Craniocervical junction maintained.  SINUSES/ORBITS: The mastoid air-cells and included paranasal sinuses are well-aerated. The included ocular globes and orbital contents are non-suspicious. OTHER: None. IMPRESSION: Unchanged white matter disease, atypical for classic demyelination. Findings can be secondary to immunosuppressive therapy, vasculitis or graft-versus-host disease. Electronically Signed   By: Elon Alas M.D.   On: 06/18/2016 21:37   Mr Cervical Spine Wo Contrast  Result Date: 07/10/2016 CLINICAL DATA:  Confusion and hallucinations. History of multiple sclerosis. Elevated GFR. EXAM: MRI CERVICAL SPINE WITHOUT CONTRAST TECHNIQUE: Multiplanar, multisequence MR imaging of the cervical spine was performed. No intravenous contrast was administered. COMPARISON:  MRI brain from 07/08/2016 FINDINGS: Despite efforts by the technologist and patient, motion artifact is present on today's exam and could not be eliminated. This reduces exam sensitivity and specificity. Alignment: No vertebral subluxation is observed. Vertebrae: No significant vertebral marrow edema is identified. Cord: There is some subtle cord heterogeneity on the sagittal T2 weighted images particularly around the C6 level, but the axial images do not confirm this and a discrete cord lesion is not definitively seen. I suspect that some of this very vague heterogeneity is due to motion artifact. Expected pulsation artifact in the regional CSF. Posterior Fossa, vertebral arteries, paraspinal tissues: Subtle T2 accentuation in the left cerebellar white matter adjacent to a known small venous anomaly as shown on prior brain MRI, unchanged. Disc levels: C2-3: Unremarkable. C3-4:  Unremarkable. C4-5:  Unremarkable. C5-6:  Unremarkable. C6-7:  No impingement.  Small right paracentral disc protrusion. C7-T1:  Unremarkable. IMPRESSION: 1. No definite cord lesion is identified. Vague heterogeneity in the cervical cord around the C6 level is most attributable to the underlying motion  artifact. 2. No impingement in the cervical spine although there is a small right paracentral disc protrusion at C6-7. 3. Subtle T2 accentuation in the left cerebellar white matter as described on prior MRI. Electronically Signed   By: Van Clines M.D.   On: 07/10/2016 10:46   Dg Fluoro Guide Lumbar Puncture  Result Date: 07/10/2016 CLINICAL DATA:  Altered mental status with hallucinations and anxiety. EXAM: DIAGNOSTIC LUMBAR PUNCTURE UNDER FLUOROSCOPIC GUIDANCE FLUOROSCOPY TIME:  Fluoroscopy Time:  0 minutes, 34 seconds Radiation Exposure Index (if provided by the fluoroscopic device): 5.4 mGy Number of Acquired Spot Images: 0 PROCEDURE: I discussed the risks (including hemorrhage, infection, headache, and nerve damage, among others), benefits, and alternatives to fluoroscopically guided lumbar puncture with the patient and her daughter. The patient's daughter is currently making medical decisions for her given the patient's altered mental status. We specifically discussed the high technical likelihood of success of the procedure. The patient and her daughter understood and elected for the patient to undergo the procedure. Standard time-out was employed. Following sterile skin prep and local anesthetic administration consisting of 1 percent lidocaine, a 22 gauge spinal needle was advanced without difficulty into the thecal sac at the at the L3-4 level. Clear CSF was returned. My initial attempt to turn the patient on her side to obtain opening pressure indicated that due to the patient's altered mental status this would be risky. Specifically, I was not confident that the patient would be able to turn her shoulders and hips together, and with the needle in her back I was not comfortable with her twisting her body. Accordingly, I obtain the opening pressure measurement with patient prone. Opening pressure was measured at 29 cm of water. 12 cc of  clear CSF was collected. A closing pressure was performed  (again, in the prone position) and measured 16 cm of water. The needle was subsequently removed and the skin cleansed and bandaged. No immediate complications were observed. IMPRESSION: 1. Technically successful fluoroscopically guided lumbar puncture at the L3-4 level yielding 12 cc of clear CSF. Due to patient limitations, I was not able to turn the patient in the lateral decubitus position for pressure measurements. This can introduce some inaccuracy. Opening pressure was measured in the prone position at 29 cm water and closing pressure at 16 cm of water. Electronically Signed   By: Van Clines M.D.   On: 07/10/2016 09:56      Subjective: Patient reports no feelings of agitation or anxiety. Febrile overnight without chills. No dysuria, coughing, or acute mental status changes.  Discharge Exam: Vitals:   07/13/16 0521 07/13/16 0652  BP: (!) 183/92 (!) 172/89  Pulse: 89   Resp: 20   Temp: 99.5 F (37.5 C)    Vitals:   07/12/16 2250 07/13/16 0427 07/13/16 0521 07/13/16 0652  BP: (!) 158/93  (!) 183/92 (!) 172/89  Pulse: 87  89   Resp: 20  20   Temp: 99.2 F (37.3 C)  99.5 F (37.5 C)   TempSrc: Oral  Oral   SpO2: 100%  95%   Weight:  62.9 kg (138 lb 9.6 oz)    Height:        General exam: Appears slightly agitated but comfortable  Respiratory system: Clear to auscultation. Respiratory effort normal. Cardiovascular system: S1 & S2 heard, RRR. No murmurs. Gastrointestinal system: Abdomen is nondistended, soft and nontender. Normal bowel sounds heard. Central nervous system: Alert and oriented to person, place and year. Fails finger-to-nose bilaterally.  Extremities: No edema. No calf tenderness Skin: No cyanosis. No rashes Psychiatry: Judgement and insight appear normal. Mood & affect flat and anxious   The results of significant diagnostics from this hospitalization (including imaging, microbiology, ancillary and laboratory) are listed below for reference.      Microbiology: Recent Results (from the past 240 hour(s))  Urine culture     Status: Abnormal   Collection Time: 07/08/16  1:10 AM  Result Value Ref Range Status   Specimen Description URINE, RANDOM  Final   Special Requests NONE  Final   Culture MULTIPLE SPECIES PRESENT, SUGGEST RECOLLECTION (A)  Final   Report Status 07/09/2016 FINAL  Final  Culture, blood (routine x 2)     Status: None (Preliminary result)   Collection Time: 07/11/16  9:42 AM  Result Value Ref Range Status   Specimen Description BLOOD LEFT ARM  Final   Special Requests   Final    BOTTLES DRAWN AEROBIC AND ANAEROBIC Blood Culture adequate volume   Culture   Final    NO GROWTH 2 DAYS Performed at [facility], [address], [place], [place] [address]    Report Status PENDING  Incomplete  Culture, blood (routine x 2)     Status: None (Preliminary result)   Collection Time: 07/11/16  9:49 AM  Result Value Ref Range Status   Specimen Description BLOOD LEFT ARM  Final   Special Requests   Final    BOTTLES DRAWN AEROBIC AND ANAEROBIC Blood Culture adequate volume   Culture   Final    NO GROWTH 2 DAYS Performed at [facility], [address], [place], [place] [address]    Report Status PENDING  Incomplete     Labs:  BNP (last 3 results)  Recent Labs  07/08/16 0450  BNP 60.1   Basic Metabolic Panel:  Recent Labs Lab 07/07/16 1830 07/08/16 0450 07/10/16 0615 07/11/16 0943 07/12/16 0935  NA 138 136 139 140 141  K 4.2 3.6 3.1* 3.1* 3.6  CL 103 104 105 109 110  CO2 26 24 21* 24 22  GLUCOSE 200* 120* 97 198* 109*  BUN 30* 31* 26* 21* 20  CREATININE 3.11* 2.83* 2.65* 2.60* 2.52*  CALCIUM 9.2 8.9 8.4* 8.6* 8.8*  MG  --   --   --   --  1.0*   Liver Function Tests:  Recent Labs Lab 07/07/16 1830  AST 22  ALT 22  ALKPHOS 43  BILITOT 0.5  PROT 7.2  ALBUMIN 4.1   No results for input(s): LIPASE, AMYLASE in the last 168 hours.  Recent Labs Lab 07/08/16 0031  AMMONIA 16    CBC:  Recent Labs Lab 07/07/16 1830 07/08/16 0450 07/10/16 0615 07/12/16 1613  WBC 6.7 7.5 6.5 7.8  NEUTROABS  --   --   --  5.3  HGB 9.8* 9.1* 9.5* 8.9*  HCT 30.7* 27.0* 29.0* 26.9*  MCV 85.8 86.8 87.3 87.3  PLT 336 272 247 198   Cardiac Enzymes: No results for input(s): CKTOTAL, CKMB, CKMBINDEX, TROPONINI in the last 168 hours. BNP: Invalid input(s): POCBNP CBG:  Recent Labs Lab 07/12/16 1208 07/12/16 1656 07/12/16 2032 07/13/16 0734 07/13/16 1454  GLUCAP 155* 114* 117* 102* 138*   D-Dimer No results for input(s): DDIMER in the last 72 hours. Hgb A1c No results for input(s): HGBA1C in the last 72 hours. Lipid Profile No results for input(s): CHOL, HDL, LDLCALC, TRIG, CHOLHDL, LDLDIRECT in the last 72 hours. Thyroid function studies No results for input(s): TSH, T4TOTAL, T3FREE, THYROIDAB in the last 72 hours.  Invalid input(s): FREET3 Anemia work up No results for input(s): VITAMINB12, FOLATE, FERRITIN, TIBC, IRON, RETICCTPCT in the last 72 hours. Urinalysis    Component Value Date/Time   COLORURINE YELLOW 07/08/2016 0110   APPEARANCEUR CLOUDY (A) 07/08/2016 0110   LABSPEC 1.010 07/08/2016 0110   PHURINE 5.0 07/08/2016 0110   GLUCOSEU NEGATIVE 07/08/2016 0110   HGBUR NEGATIVE 07/08/2016 0110   BILIRUBINUR NEGATIVE 07/08/2016 0110   KETONESUR NEGATIVE 07/08/2016 0110   PROTEINUR 100 (A) 07/08/2016 0110   UROBILINOGEN 0.2 09/16/2014 1330   NITRITE NEGATIVE 07/08/2016 0110   LEUKOCYTESUR NEGATIVE 07/08/2016 0110   Sepsis Labs Invalid input(s): PROCALCITONIN,  WBC,  LACTICIDVEN Microbiology Recent Results (from the past 240 hour(s))  Urine culture     Status: Abnormal   Collection Time: 07/08/16  1:10 AM  Result Value Ref Range Status   Specimen Description URINE, RANDOM  Final   Special Requests NONE  Final   Culture MULTIPLE SPECIES PRESENT, SUGGEST RECOLLECTION (A)  Final   Report Status 07/09/2016 FINAL  Final  Culture, blood (routine x 2)      Status: None (Preliminary result)   Collection Time: 07/11/16  9:42 AM  Result Value Ref Range Status   Specimen Description BLOOD LEFT ARM  Final   Special Requests   Final    BOTTLES DRAWN AEROBIC AND ANAEROBIC Blood Culture adequate volume   Culture   Final    NO GROWTH 2 DAYS Performed at [facility], [address], [place], [place] [address]    Report Status PENDING  Incomplete  Culture, blood (routine x 2)     Status: None (Preliminary result)   Collection  Time: 07/11/16  9:49 AM  Result Value Ref Range Status   Specimen Description BLOOD LEFT ARM  Final   Special Requests   Final    BOTTLES DRAWN AEROBIC AND ANAEROBIC Blood Culture adequate volume   Culture   Final    NO GROWTH 2 DAYS Performed at [facility], [address] [place], [place] [address]    Report Status PENDING  Incomplete     Time coordinating discharge: Over 30 minutes  SIGNED:   Cordelia Poche, MD [facility] 07/13/2016, 5:09 PM Pager [phone]  If 7PM-7AM, please contact night-coverage [email or web address] Password [email or web address]

## 2016-07-13 NOTE — Progress Notes (Signed)
Writer gave report to receiving nurse at [facility], Delilah Shan, at [place].

## 2016-07-13 NOTE — Accreditation Note (Signed)
Pt to be transferred this evening to [facility] via [place]. Family aware of transfer.

## 2016-07-13 NOTE — Progress Notes (Signed)
Per pt's daughter -- DO NOT TELL PT"S UNCLE, [employer], ANYTHING ABOUT THE PT
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
## 2016-07-14 LAB — OLIGOCLONAL BANDS, CSF + SERM

## 2016-07-15 LAB — MYCOPHENOLIC ACID (CELLCEPT)
MPA Glucuronide: 189 ug/mL — ABNORMAL HIGH (ref 15–125)
MPA: 3.4 ug/mL (ref 1.0–3.5)

## 2016-07-16 ENCOUNTER — Telehealth: Payer: Self-pay | Admitting: Neurology

## 2016-07-16 LAB — IGG CSF INDEX
ALBUMIN CSF-MCNC: 26 mg/dL (ref 11–48)
Albumin: 3.4 g/dL — ABNORMAL LOW (ref 3.5–5.5)
IGG (IMMUNOGLOBIN G), SERUM: 944 mg/dL (ref 700–1600)
IGG INDEX CSF: 3.3 — AB (ref 0.0–0.7)
IGG/ALB RATIO, CSF: 0.92 — AB (ref 0.00–0.25)
IgG, CSF: 23.8 mg/dL — ABNORMAL HIGH (ref 0.0–8.6)

## 2016-07-16 LAB — VDRL, CSF: VDRL Quant, CSF: NONREACTIVE

## 2016-07-16 LAB — CULTURE, BLOOD (ROUTINE X 2)
CULTURE: NO GROWTH
CULTURE: NO GROWTH
SPECIAL REQUESTS: ADEQUATE
Special Requests: ADEQUATE

## 2016-07-16 LAB — JC VIRUS, PCR CSF: JC Virus PCR, CSF: NEGATIVE

## 2016-07-16 NOTE — Telephone Encounter (Signed)
JC virus antibody panel is positive, titer of 0.94.

## 2016-07-16 NOTE — Telephone Encounter (Signed)
Received JCV results from [place] via fax. Gave to CW,MD for review.

## 2016-07-23 ENCOUNTER — Ambulatory Visit: Payer: Medicare Other | Admitting: Adult Health

## 2016-07-29 ENCOUNTER — Ambulatory Visit: Payer: Medicare Other | Admitting: Adult Health

## 2016-07-30 ENCOUNTER — Encounter: Payer: Self-pay | Admitting: Adult Health

## 2016-08-05 ENCOUNTER — Ambulatory Visit: Payer: Medicare Other | Admitting: Adult Health

## 2016-08-19 ENCOUNTER — Ambulatory Visit: Payer: Medicare Other | Admitting: Family Medicine

## 2016-08-19 DIAGNOSIS — Z0289 Encounter for other administrative examinations: Secondary | ICD-10-CM

## 2016-11-13 ENCOUNTER — Encounter: Payer: Self-pay | Admitting: *Deleted

## 2016-12-12 DIAGNOSIS — L89322 Pressure ulcer of left buttock, stage 2: Secondary | ICD-10-CM | POA: Diagnosis not present

## 2016-12-12 DIAGNOSIS — Z8679 Personal history of other diseases of the circulatory system: Secondary | ICD-10-CM | POA: Diagnosis not present

## 2016-12-12 DIAGNOSIS — I82C13 Acute embolism and thrombosis of internal jugular vein, bilateral: Secondary | ICD-10-CM | POA: Diagnosis not present

## 2016-12-12 DIAGNOSIS — Z114 Encounter for screening for human immunodeficiency virus [HIV]: Secondary | ICD-10-CM | POA: Diagnosis not present

## 2016-12-12 DIAGNOSIS — D631 Anemia in chronic kidney disease: Secondary | ICD-10-CM | POA: Diagnosis not present

## 2016-12-12 DIAGNOSIS — F418 Other specified anxiety disorders: Secondary | ICD-10-CM | POA: Diagnosis not present

## 2016-12-12 DIAGNOSIS — I5030 Unspecified diastolic (congestive) heart failure: Secondary | ICD-10-CM | POA: Diagnosis not present

## 2016-12-12 DIAGNOSIS — N179 Acute kidney failure, unspecified: Secondary | ICD-10-CM | POA: Diagnosis not present

## 2016-12-12 DIAGNOSIS — N2581 Secondary hyperparathyroidism of renal origin: Secondary | ICD-10-CM | POA: Diagnosis not present

## 2016-12-12 DIAGNOSIS — R1319 Other dysphagia: Secondary | ICD-10-CM | POA: Diagnosis not present

## 2016-12-12 DIAGNOSIS — R5381 Other malaise: Secondary | ICD-10-CM | POA: Diagnosis not present

## 2016-12-12 DIAGNOSIS — N186 End stage renal disease: Secondary | ICD-10-CM | POA: Diagnosis not present

## 2016-12-12 DIAGNOSIS — G0481 Other encephalitis and encephalomyelitis: Secondary | ICD-10-CM | POA: Diagnosis not present

## 2016-12-12 DIAGNOSIS — Z4822 Encounter for aftercare following kidney transplant: Secondary | ICD-10-CM | POA: Diagnosis not present

## 2016-12-12 DIAGNOSIS — J9601 Acute respiratory failure with hypoxia: Secondary | ICD-10-CM | POA: Diagnosis not present

## 2016-12-12 DIAGNOSIS — Z940 Kidney transplant status: Secondary | ICD-10-CM | POA: Diagnosis not present

## 2016-12-12 DIAGNOSIS — R41841 Cognitive communication deficit: Secondary | ICD-10-CM | POA: Diagnosis not present

## 2016-12-12 DIAGNOSIS — G9349 Other encephalopathy: Secondary | ICD-10-CM | POA: Diagnosis not present

## 2016-12-12 DIAGNOSIS — M6281 Muscle weakness (generalized): Secondary | ICD-10-CM | POA: Diagnosis not present

## 2016-12-12 DIAGNOSIS — Z992 Dependence on renal dialysis: Secondary | ICD-10-CM | POA: Diagnosis not present

## 2016-12-12 DIAGNOSIS — F05 Delirium due to known physiological condition: Secondary | ICD-10-CM | POA: Diagnosis not present

## 2016-12-12 DIAGNOSIS — R791 Abnormal coagulation profile: Secondary | ICD-10-CM | POA: Diagnosis not present

## 2016-12-12 DIAGNOSIS — I132 Hypertensive heart and chronic kidney disease with heart failure and with stage 5 chronic kidney disease, or end stage renal disease: Secondary | ICD-10-CM | POA: Diagnosis not present

## 2016-12-12 DIAGNOSIS — Z7409 Other reduced mobility: Secondary | ICD-10-CM | POA: Diagnosis not present

## 2016-12-12 DIAGNOSIS — I82621 Acute embolism and thrombosis of deep veins of right upper extremity: Secondary | ICD-10-CM | POA: Diagnosis not present

## 2016-12-12 DIAGNOSIS — R1312 Dysphagia, oropharyngeal phase: Secondary | ICD-10-CM | POA: Diagnosis not present

## 2016-12-12 DIAGNOSIS — Z794 Long term (current) use of insulin: Secondary | ICD-10-CM | POA: Diagnosis not present

## 2016-12-12 DIAGNOSIS — K219 Gastro-esophageal reflux disease without esophagitis: Secondary | ICD-10-CM | POA: Diagnosis not present

## 2016-12-12 DIAGNOSIS — I10 Essential (primary) hypertension: Secondary | ICD-10-CM | POA: Diagnosis not present

## 2016-12-12 DIAGNOSIS — G130 Paraneoplastic neuromyopathy and neuropathy: Secondary | ICD-10-CM | POA: Diagnosis not present

## 2016-12-12 DIAGNOSIS — G4089 Other seizures: Secondary | ICD-10-CM | POA: Diagnosis not present

## 2016-12-12 DIAGNOSIS — Z7901 Long term (current) use of anticoagulants: Secondary | ICD-10-CM | POA: Diagnosis not present

## 2016-12-12 DIAGNOSIS — Z888 Allergy status to other drugs, medicaments and biological substances status: Secondary | ICD-10-CM | POA: Diagnosis not present

## 2016-12-12 DIAGNOSIS — R278 Other lack of coordination: Secondary | ICD-10-CM | POA: Diagnosis not present

## 2016-12-12 DIAGNOSIS — L89312 Pressure ulcer of right buttock, stage 2: Secondary | ICD-10-CM | POA: Diagnosis not present

## 2016-12-12 DIAGNOSIS — E785 Hyperlipidemia, unspecified: Secondary | ICD-10-CM | POA: Diagnosis not present

## 2016-12-12 DIAGNOSIS — I120 Hypertensive chronic kidney disease with stage 5 chronic kidney disease or end stage renal disease: Secondary | ICD-10-CM | POA: Diagnosis not present

## 2016-12-12 DIAGNOSIS — E1122 Type 2 diabetes mellitus with diabetic chronic kidney disease: Secondary | ICD-10-CM | POA: Diagnosis not present

## 2016-12-12 DIAGNOSIS — Z8249 Family history of ischemic heart disease and other diseases of the circulatory system: Secondary | ICD-10-CM | POA: Diagnosis not present

## 2016-12-12 DIAGNOSIS — I82623 Acute embolism and thrombosis of deep veins of upper extremity, bilateral: Secondary | ICD-10-CM | POA: Diagnosis not present

## 2016-12-12 DIAGNOSIS — Z79899 Other long term (current) drug therapy: Secondary | ICD-10-CM | POA: Diagnosis not present

## 2016-12-12 DIAGNOSIS — G0490 Encephalitis and encephalomyelitis, unspecified: Secondary | ICD-10-CM | POA: Diagnosis not present

## 2016-12-12 DIAGNOSIS — N184 Chronic kidney disease, stage 4 (severe): Secondary | ICD-10-CM | POA: Diagnosis not present

## 2016-12-12 DIAGNOSIS — D509 Iron deficiency anemia, unspecified: Secondary | ICD-10-CM | POA: Diagnosis not present

## 2016-12-12 DIAGNOSIS — E119 Type 2 diabetes mellitus without complications: Secondary | ICD-10-CM | POA: Diagnosis not present

## 2016-12-12 DIAGNOSIS — I6200 Nontraumatic subdural hemorrhage, unspecified: Secondary | ICD-10-CM | POA: Diagnosis not present

## 2016-12-15 DIAGNOSIS — I82623 Acute embolism and thrombosis of deep veins of upper extremity, bilateral: Secondary | ICD-10-CM | POA: Diagnosis not present

## 2016-12-15 DIAGNOSIS — N2581 Secondary hyperparathyroidism of renal origin: Secondary | ICD-10-CM | POA: Diagnosis not present

## 2016-12-15 DIAGNOSIS — D509 Iron deficiency anemia, unspecified: Secondary | ICD-10-CM | POA: Diagnosis not present

## 2016-12-15 DIAGNOSIS — I10 Essential (primary) hypertension: Secondary | ICD-10-CM | POA: Diagnosis not present

## 2016-12-15 DIAGNOSIS — D631 Anemia in chronic kidney disease: Secondary | ICD-10-CM | POA: Diagnosis not present

## 2016-12-15 DIAGNOSIS — N186 End stage renal disease: Secondary | ICD-10-CM | POA: Diagnosis not present

## 2016-12-15 DIAGNOSIS — G9349 Other encephalopathy: Secondary | ICD-10-CM | POA: Diagnosis not present

## 2016-12-15 DIAGNOSIS — Z992 Dependence on renal dialysis: Secondary | ICD-10-CM | POA: Diagnosis not present

## 2016-12-16 DIAGNOSIS — I82623 Acute embolism and thrombosis of deep veins of upper extremity, bilateral: Secondary | ICD-10-CM | POA: Diagnosis not present

## 2016-12-17 DIAGNOSIS — D631 Anemia in chronic kidney disease: Secondary | ICD-10-CM | POA: Diagnosis not present

## 2016-12-17 DIAGNOSIS — D509 Iron deficiency anemia, unspecified: Secondary | ICD-10-CM | POA: Diagnosis not present

## 2016-12-17 DIAGNOSIS — N186 End stage renal disease: Secondary | ICD-10-CM | POA: Diagnosis not present

## 2016-12-17 DIAGNOSIS — N2581 Secondary hyperparathyroidism of renal origin: Secondary | ICD-10-CM | POA: Diagnosis not present

## 2016-12-19 DIAGNOSIS — D631 Anemia in chronic kidney disease: Secondary | ICD-10-CM | POA: Diagnosis not present

## 2016-12-19 DIAGNOSIS — D509 Iron deficiency anemia, unspecified: Secondary | ICD-10-CM | POA: Diagnosis not present

## 2016-12-19 DIAGNOSIS — N186 End stage renal disease: Secondary | ICD-10-CM | POA: Diagnosis not present

## 2016-12-19 DIAGNOSIS — N2581 Secondary hyperparathyroidism of renal origin: Secondary | ICD-10-CM | POA: Diagnosis not present

## 2016-12-21 DIAGNOSIS — Z992 Dependence on renal dialysis: Secondary | ICD-10-CM | POA: Diagnosis not present

## 2016-12-21 DIAGNOSIS — N186 End stage renal disease: Secondary | ICD-10-CM | POA: Diagnosis not present

## 2016-12-22 DIAGNOSIS — N186 End stage renal disease: Secondary | ICD-10-CM | POA: Diagnosis not present

## 2016-12-22 DIAGNOSIS — D509 Iron deficiency anemia, unspecified: Secondary | ICD-10-CM | POA: Diagnosis not present

## 2016-12-22 DIAGNOSIS — N2581 Secondary hyperparathyroidism of renal origin: Secondary | ICD-10-CM | POA: Diagnosis not present

## 2016-12-22 DIAGNOSIS — D631 Anemia in chronic kidney disease: Secondary | ICD-10-CM | POA: Diagnosis not present

## 2016-12-24 DIAGNOSIS — D509 Iron deficiency anemia, unspecified: Secondary | ICD-10-CM | POA: Diagnosis not present

## 2016-12-24 DIAGNOSIS — N2581 Secondary hyperparathyroidism of renal origin: Secondary | ICD-10-CM | POA: Diagnosis not present

## 2016-12-24 DIAGNOSIS — N186 End stage renal disease: Secondary | ICD-10-CM | POA: Diagnosis not present

## 2016-12-24 DIAGNOSIS — D631 Anemia in chronic kidney disease: Secondary | ICD-10-CM | POA: Diagnosis not present

## 2016-12-26 DIAGNOSIS — D509 Iron deficiency anemia, unspecified: Secondary | ICD-10-CM | POA: Diagnosis not present

## 2016-12-26 DIAGNOSIS — N186 End stage renal disease: Secondary | ICD-10-CM | POA: Diagnosis not present

## 2016-12-26 DIAGNOSIS — G9349 Other encephalopathy: Secondary | ICD-10-CM | POA: Diagnosis not present

## 2016-12-26 DIAGNOSIS — I10 Essential (primary) hypertension: Secondary | ICD-10-CM | POA: Diagnosis not present

## 2016-12-26 DIAGNOSIS — N2581 Secondary hyperparathyroidism of renal origin: Secondary | ICD-10-CM | POA: Diagnosis not present

## 2016-12-26 DIAGNOSIS — Z992 Dependence on renal dialysis: Secondary | ICD-10-CM | POA: Diagnosis not present

## 2016-12-26 DIAGNOSIS — I82623 Acute embolism and thrombosis of deep veins of upper extremity, bilateral: Secondary | ICD-10-CM | POA: Diagnosis not present

## 2016-12-26 DIAGNOSIS — D631 Anemia in chronic kidney disease: Secondary | ICD-10-CM | POA: Diagnosis not present

## 2016-12-29 DIAGNOSIS — N2581 Secondary hyperparathyroidism of renal origin: Secondary | ICD-10-CM | POA: Diagnosis not present

## 2016-12-29 DIAGNOSIS — N186 End stage renal disease: Secondary | ICD-10-CM | POA: Diagnosis not present

## 2016-12-29 DIAGNOSIS — D509 Iron deficiency anemia, unspecified: Secondary | ICD-10-CM | POA: Diagnosis not present

## 2016-12-29 DIAGNOSIS — D631 Anemia in chronic kidney disease: Secondary | ICD-10-CM | POA: Diagnosis not present

## 2016-12-31 DIAGNOSIS — N2581 Secondary hyperparathyroidism of renal origin: Secondary | ICD-10-CM | POA: Diagnosis not present

## 2016-12-31 DIAGNOSIS — D509 Iron deficiency anemia, unspecified: Secondary | ICD-10-CM | POA: Diagnosis not present

## 2016-12-31 DIAGNOSIS — N186 End stage renal disease: Secondary | ICD-10-CM | POA: Diagnosis not present

## 2016-12-31 DIAGNOSIS — D631 Anemia in chronic kidney disease: Secondary | ICD-10-CM | POA: Diagnosis not present

## 2017-01-02 DIAGNOSIS — N186 End stage renal disease: Secondary | ICD-10-CM | POA: Diagnosis not present

## 2017-01-02 DIAGNOSIS — N2581 Secondary hyperparathyroidism of renal origin: Secondary | ICD-10-CM | POA: Diagnosis not present

## 2017-01-02 DIAGNOSIS — D631 Anemia in chronic kidney disease: Secondary | ICD-10-CM | POA: Diagnosis not present

## 2017-01-02 DIAGNOSIS — D509 Iron deficiency anemia, unspecified: Secondary | ICD-10-CM | POA: Diagnosis not present

## 2017-01-05 DIAGNOSIS — N2581 Secondary hyperparathyroidism of renal origin: Secondary | ICD-10-CM | POA: Diagnosis not present

## 2017-01-05 DIAGNOSIS — D631 Anemia in chronic kidney disease: Secondary | ICD-10-CM | POA: Diagnosis not present

## 2017-01-05 DIAGNOSIS — D509 Iron deficiency anemia, unspecified: Secondary | ICD-10-CM | POA: Diagnosis not present

## 2017-01-05 DIAGNOSIS — N186 End stage renal disease: Secondary | ICD-10-CM | POA: Diagnosis not present

## 2017-01-07 DIAGNOSIS — D631 Anemia in chronic kidney disease: Secondary | ICD-10-CM | POA: Diagnosis not present

## 2017-01-07 DIAGNOSIS — N186 End stage renal disease: Secondary | ICD-10-CM | POA: Diagnosis not present

## 2017-01-07 DIAGNOSIS — N2581 Secondary hyperparathyroidism of renal origin: Secondary | ICD-10-CM | POA: Diagnosis not present

## 2017-01-07 DIAGNOSIS — D509 Iron deficiency anemia, unspecified: Secondary | ICD-10-CM | POA: Diagnosis not present

## 2017-01-09 DIAGNOSIS — D631 Anemia in chronic kidney disease: Secondary | ICD-10-CM | POA: Diagnosis not present

## 2017-01-09 DIAGNOSIS — N186 End stage renal disease: Secondary | ICD-10-CM | POA: Diagnosis not present

## 2017-01-09 DIAGNOSIS — D509 Iron deficiency anemia, unspecified: Secondary | ICD-10-CM | POA: Diagnosis not present

## 2017-01-09 DIAGNOSIS — N2581 Secondary hyperparathyroidism of renal origin: Secondary | ICD-10-CM | POA: Diagnosis not present

## 2017-01-12 DIAGNOSIS — D509 Iron deficiency anemia, unspecified: Secondary | ICD-10-CM | POA: Diagnosis not present

## 2017-01-12 DIAGNOSIS — N2581 Secondary hyperparathyroidism of renal origin: Secondary | ICD-10-CM | POA: Diagnosis not present

## 2017-01-12 DIAGNOSIS — N186 End stage renal disease: Secondary | ICD-10-CM | POA: Diagnosis not present

## 2017-01-12 DIAGNOSIS — D631 Anemia in chronic kidney disease: Secondary | ICD-10-CM | POA: Diagnosis not present

## 2017-01-13 DIAGNOSIS — R1319 Other dysphagia: Secondary | ICD-10-CM | POA: Diagnosis not present

## 2017-01-14 DIAGNOSIS — N186 End stage renal disease: Secondary | ICD-10-CM | POA: Diagnosis not present

## 2017-01-14 DIAGNOSIS — F418 Other specified anxiety disorders: Secondary | ICD-10-CM | POA: Diagnosis not present

## 2017-01-14 DIAGNOSIS — N2581 Secondary hyperparathyroidism of renal origin: Secondary | ICD-10-CM | POA: Diagnosis not present

## 2017-01-14 DIAGNOSIS — D509 Iron deficiency anemia, unspecified: Secondary | ICD-10-CM | POA: Diagnosis not present

## 2017-01-14 DIAGNOSIS — D631 Anemia in chronic kidney disease: Secondary | ICD-10-CM | POA: Diagnosis not present

## 2017-01-14 DIAGNOSIS — I82623 Acute embolism and thrombosis of deep veins of upper extremity, bilateral: Secondary | ICD-10-CM | POA: Diagnosis not present

## 2017-01-16 DIAGNOSIS — N2581 Secondary hyperparathyroidism of renal origin: Secondary | ICD-10-CM | POA: Diagnosis not present

## 2017-01-16 DIAGNOSIS — N186 End stage renal disease: Secondary | ICD-10-CM | POA: Diagnosis not present

## 2017-01-16 DIAGNOSIS — D509 Iron deficiency anemia, unspecified: Secondary | ICD-10-CM | POA: Diagnosis not present

## 2017-01-16 DIAGNOSIS — D631 Anemia in chronic kidney disease: Secondary | ICD-10-CM | POA: Diagnosis not present

## 2017-01-19 DIAGNOSIS — D509 Iron deficiency anemia, unspecified: Secondary | ICD-10-CM | POA: Diagnosis not present

## 2017-01-19 DIAGNOSIS — N186 End stage renal disease: Secondary | ICD-10-CM | POA: Diagnosis not present

## 2017-01-19 DIAGNOSIS — D631 Anemia in chronic kidney disease: Secondary | ICD-10-CM | POA: Diagnosis not present

## 2017-01-19 DIAGNOSIS — N2581 Secondary hyperparathyroidism of renal origin: Secondary | ICD-10-CM | POA: Diagnosis not present

## 2017-01-21 DIAGNOSIS — D631 Anemia in chronic kidney disease: Secondary | ICD-10-CM | POA: Diagnosis not present

## 2017-01-21 DIAGNOSIS — Z992 Dependence on renal dialysis: Secondary | ICD-10-CM | POA: Diagnosis not present

## 2017-01-21 DIAGNOSIS — N2581 Secondary hyperparathyroidism of renal origin: Secondary | ICD-10-CM | POA: Diagnosis not present

## 2017-01-21 DIAGNOSIS — D509 Iron deficiency anemia, unspecified: Secondary | ICD-10-CM | POA: Diagnosis not present

## 2017-01-21 DIAGNOSIS — N186 End stage renal disease: Secondary | ICD-10-CM | POA: Diagnosis not present

## 2017-01-22 DIAGNOSIS — G9349 Other encephalopathy: Secondary | ICD-10-CM | POA: Diagnosis not present

## 2017-01-22 DIAGNOSIS — F418 Other specified anxiety disorders: Secondary | ICD-10-CM | POA: Diagnosis not present

## 2017-01-23 DIAGNOSIS — D631 Anemia in chronic kidney disease: Secondary | ICD-10-CM | POA: Diagnosis not present

## 2017-01-23 DIAGNOSIS — N2581 Secondary hyperparathyroidism of renal origin: Secondary | ICD-10-CM | POA: Diagnosis not present

## 2017-01-23 DIAGNOSIS — N186 End stage renal disease: Secondary | ICD-10-CM | POA: Diagnosis not present

## 2017-01-23 DIAGNOSIS — D509 Iron deficiency anemia, unspecified: Secondary | ICD-10-CM | POA: Diagnosis not present

## 2017-01-26 DIAGNOSIS — N2581 Secondary hyperparathyroidism of renal origin: Secondary | ICD-10-CM | POA: Diagnosis not present

## 2017-01-26 DIAGNOSIS — D631 Anemia in chronic kidney disease: Secondary | ICD-10-CM | POA: Diagnosis not present

## 2017-01-26 DIAGNOSIS — D509 Iron deficiency anemia, unspecified: Secondary | ICD-10-CM | POA: Diagnosis not present

## 2017-01-26 DIAGNOSIS — N186 End stage renal disease: Secondary | ICD-10-CM | POA: Diagnosis not present

## 2017-01-28 DIAGNOSIS — D509 Iron deficiency anemia, unspecified: Secondary | ICD-10-CM | POA: Diagnosis not present

## 2017-01-28 DIAGNOSIS — N186 End stage renal disease: Secondary | ICD-10-CM | POA: Diagnosis not present

## 2017-01-28 DIAGNOSIS — N2581 Secondary hyperparathyroidism of renal origin: Secondary | ICD-10-CM | POA: Diagnosis not present

## 2017-01-28 DIAGNOSIS — D631 Anemia in chronic kidney disease: Secondary | ICD-10-CM | POA: Diagnosis not present

## 2017-01-29 DIAGNOSIS — N186 End stage renal disease: Secondary | ICD-10-CM | POA: Diagnosis not present

## 2017-01-29 DIAGNOSIS — I132 Hypertensive heart and chronic kidney disease with heart failure and with stage 5 chronic kidney disease, or end stage renal disease: Secondary | ICD-10-CM | POA: Diagnosis not present

## 2017-01-29 DIAGNOSIS — G0490 Encephalitis and encephalomyelitis, unspecified: Secondary | ICD-10-CM | POA: Diagnosis not present

## 2017-01-29 DIAGNOSIS — Z8249 Family history of ischemic heart disease and other diseases of the circulatory system: Secondary | ICD-10-CM | POA: Diagnosis not present

## 2017-01-29 DIAGNOSIS — E785 Hyperlipidemia, unspecified: Secondary | ICD-10-CM | POA: Diagnosis not present

## 2017-01-29 DIAGNOSIS — Z79899 Other long term (current) drug therapy: Secondary | ICD-10-CM | POA: Diagnosis not present

## 2017-01-29 DIAGNOSIS — G130 Paraneoplastic neuromyopathy and neuropathy: Secondary | ICD-10-CM | POA: Diagnosis not present

## 2017-01-29 DIAGNOSIS — Z992 Dependence on renal dialysis: Secondary | ICD-10-CM | POA: Diagnosis not present

## 2017-01-29 DIAGNOSIS — I120 Hypertensive chronic kidney disease with stage 5 chronic kidney disease or end stage renal disease: Secondary | ICD-10-CM | POA: Diagnosis not present

## 2017-01-29 DIAGNOSIS — I5030 Unspecified diastolic (congestive) heart failure: Secondary | ICD-10-CM | POA: Diagnosis not present

## 2017-01-29 DIAGNOSIS — Z8679 Personal history of other diseases of the circulatory system: Secondary | ICD-10-CM | POA: Diagnosis not present

## 2017-01-29 DIAGNOSIS — K219 Gastro-esophageal reflux disease without esophagitis: Secondary | ICD-10-CM | POA: Diagnosis not present

## 2017-01-29 DIAGNOSIS — E119 Type 2 diabetes mellitus without complications: Secondary | ICD-10-CM | POA: Diagnosis not present

## 2017-01-29 DIAGNOSIS — Z794 Long term (current) use of insulin: Secondary | ICD-10-CM | POA: Diagnosis not present

## 2017-01-29 DIAGNOSIS — E1122 Type 2 diabetes mellitus with diabetic chronic kidney disease: Secondary | ICD-10-CM | POA: Diagnosis not present

## 2017-01-29 DIAGNOSIS — Z940 Kidney transplant status: Secondary | ICD-10-CM | POA: Diagnosis not present

## 2017-01-29 DIAGNOSIS — Z4822 Encounter for aftercare following kidney transplant: Secondary | ICD-10-CM | POA: Diagnosis not present

## 2017-01-30 DIAGNOSIS — N2581 Secondary hyperparathyroidism of renal origin: Secondary | ICD-10-CM | POA: Diagnosis not present

## 2017-01-30 DIAGNOSIS — D509 Iron deficiency anemia, unspecified: Secondary | ICD-10-CM | POA: Diagnosis not present

## 2017-01-30 DIAGNOSIS — D631 Anemia in chronic kidney disease: Secondary | ICD-10-CM | POA: Diagnosis not present

## 2017-01-30 DIAGNOSIS — N186 End stage renal disease: Secondary | ICD-10-CM | POA: Diagnosis not present

## 2017-02-02 DIAGNOSIS — N2581 Secondary hyperparathyroidism of renal origin: Secondary | ICD-10-CM | POA: Diagnosis not present

## 2017-02-02 DIAGNOSIS — D631 Anemia in chronic kidney disease: Secondary | ICD-10-CM | POA: Diagnosis not present

## 2017-02-02 DIAGNOSIS — N186 End stage renal disease: Secondary | ICD-10-CM | POA: Diagnosis not present

## 2017-02-02 DIAGNOSIS — D509 Iron deficiency anemia, unspecified: Secondary | ICD-10-CM | POA: Diagnosis not present

## 2017-02-04 DIAGNOSIS — D509 Iron deficiency anemia, unspecified: Secondary | ICD-10-CM | POA: Diagnosis not present

## 2017-02-04 DIAGNOSIS — N186 End stage renal disease: Secondary | ICD-10-CM | POA: Diagnosis not present

## 2017-02-04 DIAGNOSIS — N2581 Secondary hyperparathyroidism of renal origin: Secondary | ICD-10-CM | POA: Diagnosis not present

## 2017-02-04 DIAGNOSIS — D631 Anemia in chronic kidney disease: Secondary | ICD-10-CM | POA: Diagnosis not present

## 2017-02-06 DIAGNOSIS — N186 End stage renal disease: Secondary | ICD-10-CM | POA: Diagnosis not present

## 2017-02-06 DIAGNOSIS — D509 Iron deficiency anemia, unspecified: Secondary | ICD-10-CM | POA: Diagnosis not present

## 2017-02-06 DIAGNOSIS — N2581 Secondary hyperparathyroidism of renal origin: Secondary | ICD-10-CM | POA: Diagnosis not present

## 2017-02-06 DIAGNOSIS — D631 Anemia in chronic kidney disease: Secondary | ICD-10-CM | POA: Diagnosis not present

## 2017-02-08 DIAGNOSIS — D631 Anemia in chronic kidney disease: Secondary | ICD-10-CM | POA: Diagnosis not present

## 2017-02-08 DIAGNOSIS — D509 Iron deficiency anemia, unspecified: Secondary | ICD-10-CM | POA: Diagnosis not present

## 2017-02-08 DIAGNOSIS — N186 End stage renal disease: Secondary | ICD-10-CM | POA: Diagnosis not present

## 2017-02-08 DIAGNOSIS — N2581 Secondary hyperparathyroidism of renal origin: Secondary | ICD-10-CM | POA: Diagnosis not present

## 2017-02-10 DIAGNOSIS — N2581 Secondary hyperparathyroidism of renal origin: Secondary | ICD-10-CM | POA: Diagnosis not present

## 2017-02-10 DIAGNOSIS — D509 Iron deficiency anemia, unspecified: Secondary | ICD-10-CM | POA: Diagnosis not present

## 2017-02-10 DIAGNOSIS — N186 End stage renal disease: Secondary | ICD-10-CM | POA: Diagnosis not present

## 2017-02-10 DIAGNOSIS — D631 Anemia in chronic kidney disease: Secondary | ICD-10-CM | POA: Diagnosis not present

## 2017-02-13 DIAGNOSIS — N186 End stage renal disease: Secondary | ICD-10-CM | POA: Diagnosis not present

## 2017-02-13 DIAGNOSIS — N2581 Secondary hyperparathyroidism of renal origin: Secondary | ICD-10-CM | POA: Diagnosis not present

## 2017-02-13 DIAGNOSIS — D631 Anemia in chronic kidney disease: Secondary | ICD-10-CM | POA: Diagnosis not present

## 2017-02-13 DIAGNOSIS — D509 Iron deficiency anemia, unspecified: Secondary | ICD-10-CM | POA: Diagnosis not present

## 2017-02-16 DIAGNOSIS — R1319 Other dysphagia: Secondary | ICD-10-CM | POA: Diagnosis not present

## 2017-02-16 DIAGNOSIS — I10 Essential (primary) hypertension: Secondary | ICD-10-CM | POA: Diagnosis not present

## 2017-02-16 DIAGNOSIS — R5381 Other malaise: Secondary | ICD-10-CM | POA: Diagnosis not present

## 2017-02-16 DIAGNOSIS — I82623 Acute embolism and thrombosis of deep veins of upper extremity, bilateral: Secondary | ICD-10-CM | POA: Diagnosis not present

## 2017-02-17 DIAGNOSIS — D509 Iron deficiency anemia, unspecified: Secondary | ICD-10-CM | POA: Diagnosis not present

## 2017-02-17 DIAGNOSIS — N186 End stage renal disease: Secondary | ICD-10-CM | POA: Diagnosis not present

## 2017-02-17 DIAGNOSIS — D631 Anemia in chronic kidney disease: Secondary | ICD-10-CM | POA: Diagnosis not present

## 2017-02-17 DIAGNOSIS — N2581 Secondary hyperparathyroidism of renal origin: Secondary | ICD-10-CM | POA: Diagnosis not present

## 2017-02-20 DIAGNOSIS — N186 End stage renal disease: Secondary | ICD-10-CM | POA: Diagnosis not present

## 2017-02-20 DIAGNOSIS — N2581 Secondary hyperparathyroidism of renal origin: Secondary | ICD-10-CM | POA: Diagnosis not present

## 2017-02-20 DIAGNOSIS — D509 Iron deficiency anemia, unspecified: Secondary | ICD-10-CM | POA: Diagnosis not present

## 2017-02-20 DIAGNOSIS — Z992 Dependence on renal dialysis: Secondary | ICD-10-CM | POA: Diagnosis not present

## 2017-02-20 DIAGNOSIS — D631 Anemia in chronic kidney disease: Secondary | ICD-10-CM | POA: Diagnosis not present

## 2017-02-21 DIAGNOSIS — E8779 Other fluid overload: Secondary | ICD-10-CM | POA: Diagnosis not present

## 2017-02-21 DIAGNOSIS — N186 End stage renal disease: Secondary | ICD-10-CM | POA: Diagnosis not present

## 2017-02-22 DIAGNOSIS — N186 End stage renal disease: Secondary | ICD-10-CM | POA: Diagnosis not present

## 2017-02-22 DIAGNOSIS — I132 Hypertensive heart and chronic kidney disease with heart failure and with stage 5 chronic kidney disease, or end stage renal disease: Secondary | ICD-10-CM | POA: Diagnosis not present

## 2017-02-22 DIAGNOSIS — G40109 Localization-related (focal) (partial) symptomatic epilepsy and epileptic syndromes with simple partial seizures, not intractable, without status epilepticus: Secondary | ICD-10-CM | POA: Diagnosis not present

## 2017-02-22 DIAGNOSIS — I712 Thoracic aortic aneurysm, without rupture: Secondary | ICD-10-CM | POA: Diagnosis not present

## 2017-02-22 DIAGNOSIS — I5032 Chronic diastolic (congestive) heart failure: Secondary | ICD-10-CM | POA: Diagnosis not present

## 2017-02-22 DIAGNOSIS — E1122 Type 2 diabetes mellitus with diabetic chronic kidney disease: Secondary | ICD-10-CM | POA: Diagnosis not present

## 2017-02-23 DIAGNOSIS — N186 End stage renal disease: Secondary | ICD-10-CM | POA: Diagnosis not present

## 2017-02-23 DIAGNOSIS — E8779 Other fluid overload: Secondary | ICD-10-CM | POA: Diagnosis not present

## 2017-02-25 DIAGNOSIS — I5032 Chronic diastolic (congestive) heart failure: Secondary | ICD-10-CM | POA: Diagnosis not present

## 2017-02-25 DIAGNOSIS — D631 Anemia in chronic kidney disease: Secondary | ICD-10-CM | POA: Diagnosis not present

## 2017-02-25 DIAGNOSIS — I712 Thoracic aortic aneurysm, without rupture: Secondary | ICD-10-CM | POA: Diagnosis not present

## 2017-02-25 DIAGNOSIS — I132 Hypertensive heart and chronic kidney disease with heart failure and with stage 5 chronic kidney disease, or end stage renal disease: Secondary | ICD-10-CM | POA: Diagnosis not present

## 2017-02-25 DIAGNOSIS — G40109 Localization-related (focal) (partial) symptomatic epilepsy and epileptic syndromes with simple partial seizures, not intractable, without status epilepticus: Secondary | ICD-10-CM | POA: Diagnosis not present

## 2017-02-25 DIAGNOSIS — I150 Renovascular hypertension: Secondary | ICD-10-CM | POA: Diagnosis not present

## 2017-02-25 DIAGNOSIS — N186 End stage renal disease: Secondary | ICD-10-CM | POA: Diagnosis not present

## 2017-02-25 DIAGNOSIS — D509 Iron deficiency anemia, unspecified: Secondary | ICD-10-CM | POA: Diagnosis not present

## 2017-02-25 DIAGNOSIS — Z992 Dependence on renal dialysis: Secondary | ICD-10-CM | POA: Diagnosis not present

## 2017-02-25 DIAGNOSIS — E1122 Type 2 diabetes mellitus with diabetic chronic kidney disease: Secondary | ICD-10-CM | POA: Diagnosis not present

## 2017-02-25 DIAGNOSIS — N2581 Secondary hyperparathyroidism of renal origin: Secondary | ICD-10-CM | POA: Diagnosis not present

## 2017-02-26 DIAGNOSIS — I132 Hypertensive heart and chronic kidney disease with heart failure and with stage 5 chronic kidney disease, or end stage renal disease: Secondary | ICD-10-CM | POA: Diagnosis not present

## 2017-02-26 DIAGNOSIS — E1122 Type 2 diabetes mellitus with diabetic chronic kidney disease: Secondary | ICD-10-CM | POA: Diagnosis not present

## 2017-02-26 DIAGNOSIS — I712 Thoracic aortic aneurysm, without rupture: Secondary | ICD-10-CM | POA: Diagnosis not present

## 2017-02-26 DIAGNOSIS — I5032 Chronic diastolic (congestive) heart failure: Secondary | ICD-10-CM | POA: Diagnosis not present

## 2017-02-26 DIAGNOSIS — G40109 Localization-related (focal) (partial) symptomatic epilepsy and epileptic syndromes with simple partial seizures, not intractable, without status epilepticus: Secondary | ICD-10-CM | POA: Diagnosis not present

## 2017-02-26 DIAGNOSIS — N186 End stage renal disease: Secondary | ICD-10-CM | POA: Diagnosis not present

## 2017-02-27 ENCOUNTER — Telehealth: Payer: Self-pay | Admitting: Family Medicine

## 2017-02-27 DIAGNOSIS — D509 Iron deficiency anemia, unspecified: Secondary | ICD-10-CM | POA: Diagnosis not present

## 2017-02-27 DIAGNOSIS — D631 Anemia in chronic kidney disease: Secondary | ICD-10-CM | POA: Diagnosis not present

## 2017-02-27 DIAGNOSIS — N2581 Secondary hyperparathyroidism of renal origin: Secondary | ICD-10-CM | POA: Diagnosis not present

## 2017-02-27 DIAGNOSIS — N186 End stage renal disease: Secondary | ICD-10-CM | POA: Diagnosis not present

## 2017-02-27 NOTE — Telephone Encounter (Signed)
Routed to wrong pool,  Please route accordingly

## 2017-02-27 NOTE — Telephone Encounter (Signed)
Copied from [place] [id number]. Topic: Inquiry >> Feb 27, 2017  2:24 PM Malena Catholic I, NT wrote: Reason for CRM: [place] call for have a Verbal Oren For OT for Home health it will be for weeks 2 days per weeks

## 2017-02-27 NOTE — Telephone Encounter (Unsigned)
Copied from [place] [id number]. Topic: Inquiry >> Feb 27, 2017  2:24 PM Malena Catholic I, NT wrote: Reason for CRM: [place] call for have a Verbal Oren For OT for Home health it will be for weeks 2 days per weeks Please call [place] @ [phone]

## 2017-03-03 DIAGNOSIS — D631 Anemia in chronic kidney disease: Secondary | ICD-10-CM | POA: Diagnosis not present

## 2017-03-03 DIAGNOSIS — N186 End stage renal disease: Secondary | ICD-10-CM | POA: Diagnosis not present

## 2017-03-03 DIAGNOSIS — N2581 Secondary hyperparathyroidism of renal origin: Secondary | ICD-10-CM | POA: Diagnosis not present

## 2017-03-03 DIAGNOSIS — D509 Iron deficiency anemia, unspecified: Secondary | ICD-10-CM | POA: Diagnosis not present

## 2017-03-04 DIAGNOSIS — N186 End stage renal disease: Secondary | ICD-10-CM | POA: Diagnosis not present

## 2017-03-04 DIAGNOSIS — D509 Iron deficiency anemia, unspecified: Secondary | ICD-10-CM | POA: Diagnosis not present

## 2017-03-04 DIAGNOSIS — D631 Anemia in chronic kidney disease: Secondary | ICD-10-CM | POA: Diagnosis not present

## 2017-03-04 DIAGNOSIS — N2581 Secondary hyperparathyroidism of renal origin: Secondary | ICD-10-CM | POA: Diagnosis not present

## 2017-03-05 ENCOUNTER — Other Ambulatory Visit: Payer: Self-pay

## 2017-03-05 ENCOUNTER — Telehealth: Payer: Self-pay | Admitting: Family Medicine

## 2017-03-05 DIAGNOSIS — Z01810 Encounter for preprocedural cardiovascular examination: Secondary | ICD-10-CM

## 2017-03-05 DIAGNOSIS — N186 End stage renal disease: Secondary | ICD-10-CM

## 2017-03-05 NOTE — Telephone Encounter (Signed)
Please advise should these orders come from [place]? You last saw the patient 05/19/2016

## 2017-03-05 NOTE — Telephone Encounter (Signed)
Copied from [place]. Topic: Quick Communication - See Telephone Encounter >> Mar 05, 2017  8:51 AM Aurelio Brash B wrote: CRM for notification. See Telephone encounter for:  [name] [name] from [facility] care needs verbal PT orders   [phone]    1 time a week for 1 week then 2 times a week for 4 weeks 03/05/17.

## 2017-03-05 NOTE — Telephone Encounter (Signed)
Called and gave PCP recommendation to home care.

## 2017-03-05 NOTE — Telephone Encounter (Signed)
I am unable to give orders as I have not seen pt since Feb and her entire medical situation has changed since then.

## 2017-03-06 DIAGNOSIS — N2581 Secondary hyperparathyroidism of renal origin: Secondary | ICD-10-CM | POA: Diagnosis not present

## 2017-03-06 DIAGNOSIS — D631 Anemia in chronic kidney disease: Secondary | ICD-10-CM | POA: Diagnosis not present

## 2017-03-06 DIAGNOSIS — N186 End stage renal disease: Secondary | ICD-10-CM | POA: Diagnosis not present

## 2017-03-06 DIAGNOSIS — D509 Iron deficiency anemia, unspecified: Secondary | ICD-10-CM | POA: Diagnosis not present

## 2017-03-09 DIAGNOSIS — N186 End stage renal disease: Secondary | ICD-10-CM | POA: Diagnosis not present

## 2017-03-09 DIAGNOSIS — I82401 Acute embolism and thrombosis of unspecified deep veins of right lower extremity: Secondary | ICD-10-CM | POA: Diagnosis not present

## 2017-03-09 DIAGNOSIS — N2581 Secondary hyperparathyroidism of renal origin: Secondary | ICD-10-CM | POA: Diagnosis not present

## 2017-03-09 DIAGNOSIS — D509 Iron deficiency anemia, unspecified: Secondary | ICD-10-CM | POA: Diagnosis not present

## 2017-03-09 DIAGNOSIS — D631 Anemia in chronic kidney disease: Secondary | ICD-10-CM | POA: Diagnosis not present

## 2017-03-11 DIAGNOSIS — N2581 Secondary hyperparathyroidism of renal origin: Secondary | ICD-10-CM | POA: Diagnosis not present

## 2017-03-11 DIAGNOSIS — D631 Anemia in chronic kidney disease: Secondary | ICD-10-CM | POA: Diagnosis not present

## 2017-03-11 DIAGNOSIS — N186 End stage renal disease: Secondary | ICD-10-CM | POA: Diagnosis not present

## 2017-03-11 DIAGNOSIS — D509 Iron deficiency anemia, unspecified: Secondary | ICD-10-CM | POA: Diagnosis not present

## 2017-03-13 DIAGNOSIS — N2581 Secondary hyperparathyroidism of renal origin: Secondary | ICD-10-CM | POA: Diagnosis not present

## 2017-03-13 DIAGNOSIS — D631 Anemia in chronic kidney disease: Secondary | ICD-10-CM | POA: Diagnosis not present

## 2017-03-13 DIAGNOSIS — D509 Iron deficiency anemia, unspecified: Secondary | ICD-10-CM | POA: Diagnosis not present

## 2017-03-13 DIAGNOSIS — N186 End stage renal disease: Secondary | ICD-10-CM | POA: Diagnosis not present

## 2017-03-15 DIAGNOSIS — N2581 Secondary hyperparathyroidism of renal origin: Secondary | ICD-10-CM | POA: Diagnosis not present

## 2017-03-15 DIAGNOSIS — I82401 Acute embolism and thrombosis of unspecified deep veins of right lower extremity: Secondary | ICD-10-CM | POA: Diagnosis not present

## 2017-03-15 DIAGNOSIS — N186 End stage renal disease: Secondary | ICD-10-CM | POA: Diagnosis not present

## 2017-03-15 DIAGNOSIS — D509 Iron deficiency anemia, unspecified: Secondary | ICD-10-CM | POA: Diagnosis not present

## 2017-03-15 DIAGNOSIS — D631 Anemia in chronic kidney disease: Secondary | ICD-10-CM | POA: Diagnosis not present

## 2017-03-19 ENCOUNTER — Ambulatory Visit: Payer: Medicare Other | Admitting: Family Medicine

## 2017-03-20 DIAGNOSIS — N2581 Secondary hyperparathyroidism of renal origin: Secondary | ICD-10-CM | POA: Diagnosis not present

## 2017-03-20 DIAGNOSIS — N186 End stage renal disease: Secondary | ICD-10-CM | POA: Diagnosis not present

## 2017-03-20 DIAGNOSIS — D509 Iron deficiency anemia, unspecified: Secondary | ICD-10-CM | POA: Diagnosis not present

## 2017-03-20 DIAGNOSIS — D631 Anemia in chronic kidney disease: Secondary | ICD-10-CM | POA: Diagnosis not present

## 2017-03-21 DIAGNOSIS — N2581 Secondary hyperparathyroidism of renal origin: Secondary | ICD-10-CM | POA: Diagnosis not present

## 2017-03-21 DIAGNOSIS — N186 End stage renal disease: Secondary | ICD-10-CM | POA: Diagnosis not present

## 2017-03-21 DIAGNOSIS — E8770 Fluid overload, unspecified: Secondary | ICD-10-CM | POA: Diagnosis not present

## 2017-03-21 DIAGNOSIS — D509 Iron deficiency anemia, unspecified: Secondary | ICD-10-CM | POA: Diagnosis not present

## 2017-03-21 DIAGNOSIS — D631 Anemia in chronic kidney disease: Secondary | ICD-10-CM | POA: Diagnosis not present

## 2017-03-22 DIAGNOSIS — D509 Iron deficiency anemia, unspecified: Secondary | ICD-10-CM | POA: Diagnosis not present

## 2017-03-22 DIAGNOSIS — D631 Anemia in chronic kidney disease: Secondary | ICD-10-CM | POA: Diagnosis not present

## 2017-03-22 DIAGNOSIS — N2581 Secondary hyperparathyroidism of renal origin: Secondary | ICD-10-CM | POA: Diagnosis not present

## 2017-03-22 DIAGNOSIS — N186 End stage renal disease: Secondary | ICD-10-CM | POA: Diagnosis not present

## 2017-03-25 DIAGNOSIS — Z23 Encounter for immunization: Secondary | ICD-10-CM | POA: Diagnosis not present

## 2017-03-25 DIAGNOSIS — D509 Iron deficiency anemia, unspecified: Secondary | ICD-10-CM | POA: Diagnosis not present

## 2017-03-25 DIAGNOSIS — N2581 Secondary hyperparathyroidism of renal origin: Secondary | ICD-10-CM | POA: Diagnosis not present

## 2017-03-25 DIAGNOSIS — D631 Anemia in chronic kidney disease: Secondary | ICD-10-CM | POA: Diagnosis not present

## 2017-03-25 DIAGNOSIS — N186 End stage renal disease: Secondary | ICD-10-CM | POA: Diagnosis not present

## 2017-03-26 ENCOUNTER — Ambulatory Visit (INDEPENDENT_AMBULATORY_CARE_PROVIDER_SITE_OTHER): Payer: Medicare Other | Admitting: Family Medicine

## 2017-03-26 ENCOUNTER — Encounter: Payer: Self-pay | Admitting: Family Medicine

## 2017-03-26 ENCOUNTER — Other Ambulatory Visit: Payer: Self-pay

## 2017-03-26 VITALS — BP 110/78 | HR 80 | Temp 99.5°F | Resp 17 | Ht 65.0 in | Wt 137.4 lb

## 2017-03-26 DIAGNOSIS — Z992 Dependence on renal dialysis: Secondary | ICD-10-CM

## 2017-03-26 DIAGNOSIS — G0481 Other encephalitis and encephalomyelitis: Secondary | ICD-10-CM | POA: Diagnosis not present

## 2017-03-26 DIAGNOSIS — N186 End stage renal disease: Secondary | ICD-10-CM | POA: Diagnosis not present

## 2017-03-26 DIAGNOSIS — E1122 Type 2 diabetes mellitus with diabetic chronic kidney disease: Secondary | ICD-10-CM

## 2017-03-26 DIAGNOSIS — M79645 Pain in left finger(s): Secondary | ICD-10-CM | POA: Diagnosis not present

## 2017-03-26 DIAGNOSIS — I82C13 Acute embolism and thrombosis of internal jugular vein, bilateral: Secondary | ICD-10-CM | POA: Diagnosis not present

## 2017-03-26 DIAGNOSIS — Z86718 Personal history of other venous thrombosis and embolism: Secondary | ICD-10-CM | POA: Insufficient documentation

## 2017-03-26 NOTE — Patient Instructions (Signed)
Follow up in 3-4 months to recheck sugar We'll notify you of your lab results and make any changes if needed We'll call you with your hand appt for the thumb pain I will check with hematology and determine when we can stop the Coumadin I'll review the Home Health Orders and we'll figure out what you need Call with any questions or concerns YOU ARE A MIRACLE!!! Happy New Year!!

## 2017-03-26 NOTE — Progress Notes (Signed)
   Subjective:    Patient ID: SALIMA RUMER, female    DOB: 1955/08/09, 62 y.o.   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  She received IVIG x4, PLEX x5, and Rituxan x3.  She had a brain bx which was complicated by subdural hematoma.  She developed hospital acquired PNA.  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  Had seizures while hospitalized but no seizure activity since d/c.  Was on Depakote but this was stopped and switched to Seroquel.  Coumadin was monitored at HD but they would like [place] to assume this role.  No longer on DM medication.  Currently only on coumadin, BP meds, and Seroquel.  Currently going to HD on [address] M/W/F.  Wants to transfer back to [facility] location.  Has not seen Dr Acie Fredrickson since hospitalization- no cardiac issues during hospitalization.  Pt is back on transplant list.  No CP, SOB, HAs, some visual changes (due for eye exam)  No numbness/tingling of hands/feet.  No abd pain, N/V.  L thumb pain at MCP joint   Review of Systems For ROS see HPI     Objective:   Physical Exam        Assessment & Plan:

## 2017-03-27 DIAGNOSIS — Z23 Encounter for immunization: Secondary | ICD-10-CM | POA: Diagnosis not present

## 2017-03-27 DIAGNOSIS — N2581 Secondary hyperparathyroidism of renal origin: Secondary | ICD-10-CM | POA: Diagnosis not present

## 2017-03-27 DIAGNOSIS — N186 End stage renal disease: Secondary | ICD-10-CM | POA: Diagnosis not present

## 2017-03-27 DIAGNOSIS — D631 Anemia in chronic kidney disease: Secondary | ICD-10-CM | POA: Diagnosis not present

## 2017-03-27 DIAGNOSIS — D509 Iron deficiency anemia, unspecified: Secondary | ICD-10-CM | POA: Diagnosis not present

## 2017-03-27 LAB — CBC WITH DIFFERENTIAL/PLATELET
BASOS PCT: 0.8 %
Basophils Absolute: 82 cells/uL (ref 0–200)
EOS PCT: 4.2 %
Eosinophils Absolute: 433 cells/uL (ref 15–500)
HCT: 34.1 % — ABNORMAL LOW (ref 35.0–45.0)
HEMOGLOBIN: 11.3 g/dL — AB (ref 11.7–15.5)
Lymphs Abs: 2390 cells/uL (ref 850–3900)
MCH: 29.7 pg (ref 27.0–33.0)
MCHC: 33.1 g/dL (ref 32.0–36.0)
MCV: 89.7 fL (ref 80.0–100.0)
MONOS PCT: 10.5 %
MPV: 10.2 fL (ref 7.5–12.5)
NEUTROS ABS: 6314 {cells}/uL (ref 1500–7800)
Neutrophils Relative %: 61.3 %
PLATELETS: 285 10*3/uL (ref 140–400)
RBC: 3.8 10*6/uL (ref 3.80–5.10)
RDW: 17.8 % — ABNORMAL HIGH (ref 11.0–15.0)
TOTAL LYMPHOCYTE: 23.2 %
WBC mixed population: 1082 cells/uL — ABNORMAL HIGH (ref 200–950)
WBC: 10.3 10*3/uL (ref 3.8–10.8)

## 2017-03-27 LAB — BASIC METABOLIC PANEL
BUN / CREAT RATIO: 8 (calc) (ref 6–22)
BUN: 37 mg/dL — AB (ref 7–25)
CO2: 23 mmol/L (ref 20–32)
CREATININE: 4.46 mg/dL — AB (ref 0.50–1.05)
Calcium: 9.4 mg/dL (ref 8.6–10.4)
Chloride: 102 mmol/L (ref 98–110)
GLUCOSE: 87 mg/dL (ref 65–99)
Potassium: 4.9 mmol/L (ref 3.5–5.3)
Sodium: 142 mmol/L (ref 135–146)

## 2017-03-27 LAB — LIPID PANEL
CHOL/HDL RATIO: 2.6 (calc) (ref <5.0)
Cholesterol: 205 mg/dL — ABNORMAL HIGH (ref <200)
HDL: 79 mg/dL (ref >50)
LDL Cholesterol (Calc): 105 mg/dL (calc) — ABNORMAL HIGH
NON-HDL CHOLESTEROL (CALC): 126 mg/dL (ref <130)
Triglycerides: 117 mg/dL (ref <150)

## 2017-03-27 LAB — HEMOGLOBIN A1C
EAG (MMOL/L): 5.5 (calc)
HEMOGLOBIN A1C: 5.1 %{Hb} (ref <5.7)
MEAN PLASMA GLUCOSE: 100 (calc)

## 2017-03-27 LAB — HEPATIC FUNCTION PANEL
AG Ratio: 1.6 (calc) (ref 1.0–2.5)
ALBUMIN MSPROF: 4.1 g/dL (ref 3.6–5.1)
ALT: 15 U/L (ref 6–29)
AST: 15 U/L (ref 10–35)
Alkaline phosphatase (APISO): 104 U/L (ref 33–130)
BILIRUBIN DIRECT: 0.1 mg/dL (ref 0.0–0.2)
BILIRUBIN TOTAL: 0.3 mg/dL (ref 0.2–1.2)
GLOBULIN: 2.6 g/dL (ref 1.9–3.7)
Indirect Bilirubin: 0.2 mg/dL (calc) (ref 0.2–1.2)
Total Protein: 6.7 g/dL (ref 6.1–8.1)

## 2017-03-27 LAB — TSH: TSH: 0.97 m[IU]/L (ref 0.40–4.50)

## 2017-03-27 LAB — PROTIME-INR
INR: 1
Prothrombin Time: 10.7 s (ref 9.0–11.5)

## 2017-03-30 DIAGNOSIS — D509 Iron deficiency anemia, unspecified: Secondary | ICD-10-CM | POA: Diagnosis not present

## 2017-03-30 DIAGNOSIS — N2581 Secondary hyperparathyroidism of renal origin: Secondary | ICD-10-CM | POA: Diagnosis not present

## 2017-03-30 DIAGNOSIS — N186 End stage renal disease: Secondary | ICD-10-CM | POA: Diagnosis not present

## 2017-03-30 DIAGNOSIS — D631 Anemia in chronic kidney disease: Secondary | ICD-10-CM | POA: Diagnosis not present

## 2017-03-30 DIAGNOSIS — Z23 Encounter for immunization: Secondary | ICD-10-CM | POA: Diagnosis not present

## 2017-03-30 NOTE — Assessment & Plan Note (Signed)
Pt has hx of diabetes but reports this has not been an issue since her hospital d/c.  Not on any medications at this time.  Will check labs and determine whether she still has diabetes or we can change this to 'hx of diabetes'.  Will follow.

## 2017-03-30 NOTE — Progress Notes (Signed)
   Subjective:    Patient ID: [name] [name], female    DOB: 02-08-1956, 62 y.o.   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  She received IVIG x4, PLEX x5, and Rituxan x3.  She had a brain bx which was complicated by subdural hematoma.  She developed hospital acquired PNA.  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  Had seizures while hospitalized but no seizure activity since d/c.  Was on Depakote but this was stopped and switched to Seroquel.  Coumadin was monitored at HD but they would like [place] to assume this role.  No longer on DM medication.  Currently only on coumadin, BP meds, and Seroquel.  Currently going to HD on [address] M/W/F.  Wants to transfer back to [facility] location.  Has not seen Dr Acie Fredrickson since hospitalization- no cardiac issues during hospitalization.  Pt is back on transplant list.  No CP, SOB, HAs, some visual changes (due for eye exam)  No numbness/tingling of hands/feet.  No abd pain, N/V.  L thumb pain at MCP joint   Review of Systems For ROS see HPI     Objective:   Physical Exam  Constitutional: She is oriented to person, place, and time. She appears well-developed and well-nourished. No distress.  HENT:  Head: Normocephalic and atraumatic.  Eyes: Conjunctivae and EOM are normal. Pupils are equal, round, and reactive to light.  Neck: Normal range of motion. Neck supple. No thyromegaly present.  Cardiovascular: Normal rate, regular rhythm, normal heart sounds and intact distal pulses.  No murmur heard. R subclavian porta-cath in place  Pulmonary/Chest: Effort normal and breath sounds normal. No respiratory distress.  Abdominal: Soft. She exhibits no distension. There is no tenderness.  Musculoskeletal: She exhibits no edema.  Lymphadenopathy:    She has no cervical adenopathy.  Neurological: She is alert and  oriented to person, place, and time.  Skin: Skin is warm and dry.  Psychiatric: She has a normal mood and affect. Her behavior is normal.  Vitals reviewed.         Assessment & Plan:  L thumb pain- pt having pain and grip difficulties at MCP joint.  Refer to hand specialist for possible injxn and treatment.  Pt expressed understanding and is in agreement w/ plan.

## 2017-03-30 NOTE — Assessment & Plan Note (Signed)
New to provider.  Reviewed records from both [facility] and [facility] regarding her long and complicated hospitalization.  She is currently asymptomatic and able to do her ADLs without assistance.  She is no longer on Depakote for seizures but continues to take Seroquel TID.  Will sign home health orders as they are needed but pt is doing remarkably well.

## 2017-03-30 NOTE — Assessment & Plan Note (Signed)
New to provider, ongoing for pt.  Original thrombi were >6 months ago.  Will reach out to hematology to determine when we are safely able to stop anti-coagulation.  I suspect, that if we do repeat imaging that shows resolution of thrombi, she would be able to stop her coumadin.  More concerning is that I don't know who has been managing her coumadin for the last few months- and neither does the pt nor her daughter.  Get PT/INR today and determine what her dose needs to be based on HD M/W/F.

## 2017-03-30 NOTE — Assessment & Plan Note (Signed)
Due to all the contrast needed for imaging during her hospitalization, they were unable to preserve the function of her transplanted kidney.  She is back on HD M/W/F and following w/  Kidney.  She reports she has been re-listed on the transplant list.  Will follow.

## 2017-03-31 NOTE — Telephone Encounter (Signed)
Verbal ok given.

## 2017-03-31 NOTE — Telephone Encounter (Signed)
Please advise?    Copied from [place] [id number]. Topic: Inquiry >> Mar 31, 2017 12:57 PM Scherrie Gerlach wrote: Reason for CRM: since Dr Birdie Riddle has seen the pt, Charise with [facility] would like to know if Dr wills sign the orders for home health PT.  (was sent 4 weeks ago) 2 wk /2

## 2017-03-31 NOTE — Telephone Encounter (Signed)
[place] for PT order.  Thanks!

## 2017-04-01 DIAGNOSIS — D631 Anemia in chronic kidney disease: Secondary | ICD-10-CM | POA: Diagnosis not present

## 2017-04-01 DIAGNOSIS — Z23 Encounter for immunization: Secondary | ICD-10-CM | POA: Diagnosis not present

## 2017-04-01 DIAGNOSIS — N2581 Secondary hyperparathyroidism of renal origin: Secondary | ICD-10-CM | POA: Diagnosis not present

## 2017-04-01 DIAGNOSIS — D509 Iron deficiency anemia, unspecified: Secondary | ICD-10-CM | POA: Diagnosis not present

## 2017-04-01 DIAGNOSIS — N186 End stage renal disease: Secondary | ICD-10-CM | POA: Diagnosis not present

## 2017-04-03 DIAGNOSIS — D509 Iron deficiency anemia, unspecified: Secondary | ICD-10-CM | POA: Diagnosis not present

## 2017-04-03 DIAGNOSIS — N2581 Secondary hyperparathyroidism of renal origin: Secondary | ICD-10-CM | POA: Diagnosis not present

## 2017-04-03 DIAGNOSIS — Z23 Encounter for immunization: Secondary | ICD-10-CM | POA: Diagnosis not present

## 2017-04-03 DIAGNOSIS — D631 Anemia in chronic kidney disease: Secondary | ICD-10-CM | POA: Diagnosis not present

## 2017-04-03 DIAGNOSIS — N186 End stage renal disease: Secondary | ICD-10-CM | POA: Diagnosis not present

## 2017-04-06 DIAGNOSIS — N186 End stage renal disease: Secondary | ICD-10-CM | POA: Diagnosis not present

## 2017-04-06 DIAGNOSIS — D509 Iron deficiency anemia, unspecified: Secondary | ICD-10-CM | POA: Diagnosis not present

## 2017-04-06 DIAGNOSIS — D631 Anemia in chronic kidney disease: Secondary | ICD-10-CM | POA: Diagnosis not present

## 2017-04-06 DIAGNOSIS — N2581 Secondary hyperparathyroidism of renal origin: Secondary | ICD-10-CM | POA: Diagnosis not present

## 2017-04-06 DIAGNOSIS — Z23 Encounter for immunization: Secondary | ICD-10-CM | POA: Diagnosis not present

## 2017-04-06 DIAGNOSIS — I82401 Acute embolism and thrombosis of unspecified deep veins of right lower extremity: Secondary | ICD-10-CM | POA: Diagnosis not present

## 2017-04-08 DIAGNOSIS — D631 Anemia in chronic kidney disease: Secondary | ICD-10-CM | POA: Diagnosis not present

## 2017-04-08 DIAGNOSIS — N186 End stage renal disease: Secondary | ICD-10-CM | POA: Diagnosis not present

## 2017-04-08 DIAGNOSIS — D509 Iron deficiency anemia, unspecified: Secondary | ICD-10-CM | POA: Diagnosis not present

## 2017-04-08 DIAGNOSIS — N2581 Secondary hyperparathyroidism of renal origin: Secondary | ICD-10-CM | POA: Diagnosis not present

## 2017-04-08 DIAGNOSIS — Z23 Encounter for immunization: Secondary | ICD-10-CM | POA: Diagnosis not present

## 2017-04-09 ENCOUNTER — Telehealth: Payer: Self-pay | Admitting: *Deleted

## 2017-04-09 DIAGNOSIS — R9401 Abnormal electroencephalogram [EEG]: Secondary | ICD-10-CM | POA: Diagnosis not present

## 2017-04-09 DIAGNOSIS — C801 Malignant (primary) neoplasm, unspecified: Secondary | ICD-10-CM | POA: Diagnosis not present

## 2017-04-09 DIAGNOSIS — G40909 Epilepsy, unspecified, not intractable, without status epilepticus: Secondary | ICD-10-CM | POA: Diagnosis not present

## 2017-04-09 DIAGNOSIS — G988 Other disorders of nervous system: Secondary | ICD-10-CM | POA: Diagnosis not present

## 2017-04-09 NOTE — Telephone Encounter (Signed)
Copied from [place] [id number]. Topic: General - Other >> Apr 09, 2017  4:43 PM Patrice Paradise wrote: Reason for CRM: Patient daughter call to see if it would be ok for her to take Alert to help her stay up doing the day.

## 2017-04-09 NOTE — Telephone Encounter (Signed)
Please advise 

## 2017-04-10 ENCOUNTER — Telehealth: Payer: Self-pay | Admitting: Family Medicine

## 2017-04-10 DIAGNOSIS — N186 End stage renal disease: Secondary | ICD-10-CM | POA: Diagnosis not present

## 2017-04-10 DIAGNOSIS — D509 Iron deficiency anemia, unspecified: Secondary | ICD-10-CM | POA: Diagnosis not present

## 2017-04-10 DIAGNOSIS — Z23 Encounter for immunization: Secondary | ICD-10-CM | POA: Diagnosis not present

## 2017-04-10 DIAGNOSIS — N2581 Secondary hyperparathyroidism of renal origin: Secondary | ICD-10-CM | POA: Diagnosis not present

## 2017-04-10 DIAGNOSIS — D631 Anemia in chronic kidney disease: Secondary | ICD-10-CM | POA: Diagnosis not present

## 2017-04-10 MED ORDER — CLONAZEPAM 0.5 MG PO TABS: 0.5000 mg | ORAL_TABLET | Freq: Every day | ORAL | 3 refills | Status: DC

## 2017-04-10 NOTE — Telephone Encounter (Signed)
I don't know what Alert is but I would not recommend a stimulant at this time as her brain is still recovering

## 2017-04-10 NOTE — Telephone Encounter (Signed)
Patient daughtert notified of PCP recommendations and is agreement and expresses an understanding.   [place] for [facility] to Discuss results / PCP recommendations / Schedule patient.

## 2017-04-10 NOTE — Telephone Encounter (Signed)
Prescription sent to pharmacy.

## 2017-04-10 NOTE — Telephone Encounter (Signed)
Copied from [place] [id number]. Topic: Quick Communication - Rx Refill/Question >> Apr 10, 2017 12:59 PM Cleaster Corin, NT wrote: Medication: Klonopin 0.5mg    Has the patient contacted their pharmacy? yes   (Agent: If no, request that the patient contact the pharmacy for the refill.)   Preferred Pharmacy (with phone number or street name):[id number] - [place], [address] [place] [address] Phone: [phone] Fax: [phone]     Agent: Please be advised that RX refills may take up to 3 business days. We ask that you follow-up with your pharmacy.

## 2017-04-10 NOTE — Telephone Encounter (Signed)
LR: no info OV: 03/26/17 Routing to provider

## 2017-04-10 NOTE — Telephone Encounter (Signed)
Spoke with patient's daughter and she has been advised that Dr. Birdie Riddle does not recommend this medication.  She stated verbal understanding and appreciated the call back to discuss.

## 2017-04-13 ENCOUNTER — Telehealth: Payer: Self-pay | Admitting: Family Medicine

## 2017-04-13 DIAGNOSIS — N186 End stage renal disease: Secondary | ICD-10-CM | POA: Diagnosis not present

## 2017-04-13 DIAGNOSIS — D631 Anemia in chronic kidney disease: Secondary | ICD-10-CM | POA: Diagnosis not present

## 2017-04-13 DIAGNOSIS — Z23 Encounter for immunization: Secondary | ICD-10-CM | POA: Diagnosis not present

## 2017-04-13 DIAGNOSIS — I82401 Acute embolism and thrombosis of unspecified deep veins of right lower extremity: Secondary | ICD-10-CM | POA: Diagnosis not present

## 2017-04-13 DIAGNOSIS — N2581 Secondary hyperparathyroidism of renal origin: Secondary | ICD-10-CM | POA: Diagnosis not present

## 2017-04-13 DIAGNOSIS — D509 Iron deficiency anemia, unspecified: Secondary | ICD-10-CM | POA: Diagnosis not present

## 2017-04-13 NOTE — Telephone Encounter (Signed)
Copied from [place] [id number]. Topic: Quick Communication - See Telephone Encounter >> Apr 13, 2017 11:35 AM Cleaster Corin, NT wrote: CRM for notification. See Telephone encounter for:   04/13/17. Charisse from [facility] asking for Verbal orders needed for pt. 2 week 2 For home exercise,balance,and gait training. Charisse can be reached at [phone]

## 2017-04-13 NOTE — Telephone Encounter (Signed)
Called and verbal ok given on voicemail.

## 2017-04-14 DIAGNOSIS — E1122 Type 2 diabetes mellitus with diabetic chronic kidney disease: Secondary | ICD-10-CM | POA: Diagnosis not present

## 2017-04-14 DIAGNOSIS — I132 Hypertensive heart and chronic kidney disease with heart failure and with stage 5 chronic kidney disease, or end stage renal disease: Secondary | ICD-10-CM | POA: Diagnosis not present

## 2017-04-14 DIAGNOSIS — G40109 Localization-related (focal) (partial) symptomatic epilepsy and epileptic syndromes with simple partial seizures, not intractable, without status epilepticus: Secondary | ICD-10-CM | POA: Diagnosis not present

## 2017-04-14 DIAGNOSIS — N186 End stage renal disease: Secondary | ICD-10-CM | POA: Diagnosis not present

## 2017-04-14 DIAGNOSIS — I5032 Chronic diastolic (congestive) heart failure: Secondary | ICD-10-CM | POA: Diagnosis not present

## 2017-04-14 DIAGNOSIS — I712 Thoracic aortic aneurysm, without rupture: Secondary | ICD-10-CM | POA: Diagnosis not present

## 2017-04-15 DIAGNOSIS — D509 Iron deficiency anemia, unspecified: Secondary | ICD-10-CM | POA: Diagnosis not present

## 2017-04-15 DIAGNOSIS — N2581 Secondary hyperparathyroidism of renal origin: Secondary | ICD-10-CM | POA: Diagnosis not present

## 2017-04-15 DIAGNOSIS — Z23 Encounter for immunization: Secondary | ICD-10-CM | POA: Diagnosis not present

## 2017-04-15 DIAGNOSIS — N186 End stage renal disease: Secondary | ICD-10-CM | POA: Diagnosis not present

## 2017-04-15 DIAGNOSIS — D631 Anemia in chronic kidney disease: Secondary | ICD-10-CM | POA: Diagnosis not present

## 2017-04-16 ENCOUNTER — Other Ambulatory Visit: Payer: Self-pay | Admitting: *Deleted

## 2017-04-16 ENCOUNTER — Encounter: Payer: Self-pay | Admitting: Vascular Surgery

## 2017-04-16 ENCOUNTER — Ambulatory Visit (HOSPITAL_COMMUNITY)
Admission: RE | Admit: 2017-04-16 | Discharge: 2017-04-16 | Disposition: A | Discharge status: Home / Self Care | Payer: Medicare Other | Source: Ambulatory Visit | Attending: Vascular Surgery | Admitting: Vascular Surgery

## 2017-04-16 ENCOUNTER — Ambulatory Visit (INDEPENDENT_AMBULATORY_CARE_PROVIDER_SITE_OTHER): Payer: Medicare Other | Admitting: Vascular Surgery

## 2017-04-16 ENCOUNTER — Encounter: Payer: Self-pay | Admitting: *Deleted

## 2017-04-16 VITALS — BP 126/82 | HR 65 | Temp 97.9°F | Resp 20 | Ht 65.0 in | Wt 142.6 lb

## 2017-04-16 DIAGNOSIS — N186 End stage renal disease: Secondary | ICD-10-CM | POA: Insufficient documentation

## 2017-04-16 DIAGNOSIS — Z992 Dependence on renal dialysis: Secondary | ICD-10-CM

## 2017-04-16 DIAGNOSIS — Z01818 Encounter for other preprocedural examination: Secondary | ICD-10-CM | POA: Diagnosis not present

## 2017-04-16 DIAGNOSIS — Z01810 Encounter for preprocedural cardiovascular examination: Secondary | ICD-10-CM | POA: Insufficient documentation

## 2017-04-16 DIAGNOSIS — I742 Embolism and thrombosis of arteries of the upper extremities: Secondary | ICD-10-CM | POA: Diagnosis not present

## 2017-04-16 NOTE — H&P (View-Only) (Signed)
Referring Physician: Dr Joelyn Oms  Patient name: [name] [name] MRN: [id number] DOB: Jun 21, 1955 Sex: female  REASON FOR CONSULT: Hemodialysis access  HPI: [name] NICKOLAS is a 62 y.o. female who previously had a right radiocephalic AV fistula right forearm AV graft right upper arm AV graft.  This was all in the remote past around 2008.  She subsequently received a kidney transplant.  Her kidney transplant function well from 2009-2018.  It continues to make urine but she does not have very good clearance.  She is sent for placement of a permanent hemodialysis access.  She is left-handed.  She is previously had catheters but these were all on the right side.  Chronic medical problems include diabetes and hypertension which are controlled.  She also has a history of hyperlipidemia.  She currently is on hemodialysis via a right-sided catheter.  Her dialysis schedule is Monday Wednesday Friday.    Past Medical History:  Diagnosis Date  . Adenomatous colon polyp 04/1998  . Anemia   . Carotid artery disease ([place])    Carotid US 1/18: bilateral ICA 1-39, R thyroid lobe nodule (1.9x2.2x3cm); numerous L thyroid lobe nodules - repeat 1 year  . Chronic kidney disease   . Chronic renal failure    post transplant  . Diabetes mellitus   . Esophagitis    Grade 1 Distal  . GERD (gastroesophageal reflux disease)   . Hemorrhoids   . Hx of cardiovascular stress test    Lexiscan Myoview (06/2013):  No ischemia, EF 66%; normal.  //  Myoview 12/17: EF 62, no ischemia or scar; Normal  . Hx of echocardiogram    a. Echocardiogram (06/2013):  Mod focal basal hypertrophy, EF 60-65%, normal wall motion, Gr 1 DD, mild AI, mildly dilated ascending aorta (41 mm), mild LAE.; b.  Echo 9/16: mod LVH, EF 60-65%, no RWMA, Gr 1 DD, trivial AI, mild dilated ascending aorta, mild LAE  . Hyperkalemia   . Hyperlipidemia   . Hypertension   . Metabolic acidosis   . Pneumonia   . Seizures ([place])    Past Surgical History:    Procedure Laterality Date  . ABDOMINAL HYSTERECTOMY    . AV FISTULA PLACEMENT  07/04/2005   Cimino AV fistula  . AV FISTULA PLACEMENT  08/27/2005  . AV FISTULA PLACEMENT W/ PTFE  08/27/2005  . CESAREAN SECTION    . DG AV DIALYSIS GRAFT DECLOT OR  07/24/2005   AV Gore-Tex graf  . DG AV DIALYSIS GRAFT DECLOT OR  Thrombosis right forearm, loop arteriovenous   Thrombosis right forearm, loop arteriovenous graft  . KIDNEY TRANSPLANT  2009   Both  . THROMBECTOMY / ARTERIOVENOUS GRAFT REVISION  10/12/2006  . THROMBECTOMY / ARTERIOVENOUS GRAFT REVISION  10/16/2006    Family History  Problem Relation Age of Onset  . Kidney disease Paternal Aunt   . Heart disease Mother   . Heart disease Father   . Colon cancer Neg Hx     SOCIAL HISTORY: Social History   Socioeconomic History  . Marital status: Widowed    Spouse name: Not on file  . Number of children: 2  . Years of education: 32  . Highest education level: Not on file  Social Needs  . Financial resource strain: Not on file  . Food insecurity - worry: Not on file  . Food insecurity - inability: Not on file  . Transportation needs - medical: Not on file  . Transportation needs - non-medical: Not on file  Occupational History  . Occupation: Investment banker, operational   Tobacco Use  . Smoking status: Never Smoker  . Smokeless tobacco: Never Used  Substance and Sexual Activity  . Alcohol use: No  . Drug use: No  . Sexual activity: No  Other Topics Concern  . Not on file  Social History Narrative   Denies caffeine use     Allergies  Allergen Reactions  . Lisinopril Shortness Of Breath, Swelling and Other (See Comments)    Throat irritation also. Patient takes losartan and tolerates fine.  [name] [Oxaprozin] Dermatitis and Other (See Comments)    hives    Current Outpatient Medications on File Prior to Visit  Medication Sig Dispense Refill  . amLODipine (NORVASC) 10 MG tablet Take 10 mg by mouth daily.    . clonazePAM  (KLONOPIN) 0.5 MG tablet Take 1 tablet (0.5 mg total) by mouth at bedtime. 30 tablet 3  . cloNIDine (CATAPRES) 0.1 MG tablet Take 0.1 mg by mouth 2 (two) times daily.  0  . hydrALAZINE (APRESOLINE) 50 MG tablet Take 1 tablet (50 mg total) by mouth 3 (three) times daily. 90 tablet 1  . isosorbide dinitrate (ISORDIL) 20 MG tablet Take 20 mg by mouth 3 (three) times daily.  0  . labetalol (NORMODYNE) 200 MG tablet Take 200 mg by mouth 3 (three) times daily.  0  . multivitamin (RENA-VIT) TABS tablet Take 1 tablet by mouth daily.  11  . PROAIR HFA 108 (90 Base) MCG/ACT inhaler INHALE 2 PUFFS BY MOUTH EVERY 4 HOURS AS NEEDED FOR WHEEZING/SHORTNESS OF BREATH  0  . QUEtiapine (SEROQUEL) 100 MG tablet Take 100 mg by mouth 3 (three) times daily.  0   No current facility-administered medications on file prior to visit.     ROS:   General:  No weight loss, Fever, chills  HEENT: No recent headaches, no nasal bleeding, no visual changes, no sore throat  Neurologic: No dizziness, blackouts, seizures. No recent symptoms of stroke or mini- stroke. No recent episodes of slurred speech, or temporary blindness.  Cardiac: No recent episodes of chest pain/pressure, no shortness of breath at rest.  No shortness of breath with exertion.  Denies history of atrial fibrillation or irregular heartbeat  Vascular: No history of rest pain in feet.  No history of claudication.  No history of non-healing ulcer, history of DVT and was on Coumadin for several months but has now been off for several months.  \  Pulmonary: No home oxygen, no productive cough, no hemoptysis,  No asthma or wheezing  Musculoskeletal:  [ ]  Arthritis, [ ]  Low back pain,  [ ]  Joint pain  Hematologic:No history of hypercoagulable state.  No history of easy bleeding.  No history of anemia  Gastrointestinal: No hematochezia or melena,  No gastroesophageal reflux, no trouble swallowing  Urinary: [X]  chronic Kidney disease, [X]  on HD - [X]  MWF or [  ] TTHS, [ ]  Burning with urination, [ ]  Frequent urination, [ ]  Difficulty urinating;   Skin: No rashes  Psychological: No history of anxiety,  No history of depression   Physical Examination  Vitals:   04/16/17 1205  BP: 126/82  Pulse: 65  Resp: 20  Temp: 97.9 F (36.6 C)  TempSrc: Oral  SpO2: 99%  Weight: 142 lb 9.6 oz (64.7 kg)  Height: 5\' 5"  (1.651 m)    Body mass index is 23.73 kg/m.  General:  Alert and oriented, no acute distress HEENT: Normal Neck: No bruit or JVD  Pulmonary: Clear to auscultation bilaterally Cardiac: Regular Rate and Rhythm without murmur Abdomen: Soft, non-tender, non-distended, no mass, no scars Skin: No rash Extremity Pulses:  2+ radial, brachial pulses bilaterally, thrombosed right forearm and upper arm graft Musculoskeletal: No deformity or edema  Neurologic: Upper and lower extremity motor 5/5 and symmetric  DATA:  Patient had a duplex ultrasound today of her arteries and veins in her upper extremities.  Right upper extremity shows a less than 3 mm radial artery left is similar brachial artery is 5-6 mm bilaterally.  Right upper arm has a basilic vein that is 3-5 mm.  Left cephalic vein in the upper arm is 3-5 mm.  It is small in the forearm.  Left basilic vein is 5-6 mm.  ASSESSMENT: Patient needs long-term hemodialysis access.  I believe the best option at this point will be to place a left brachiocephalic AV fistula.  Risk benefits possible complications and procedure details including but not limited to bleeding infection ischemic steal non-maturation of the fistula requirement for other access procedures down the road were explained to the patient her son today.  She understands and agrees to proceed.   PLAN: Left brachiocephalic AV fistula scheduled for April 22, 2017.  We will coordinate rearranging her dialysis schedule since this is on her usual dialysis day.   Ruta Hinds, MD [facility]  [place] Office: [phone] Pager: [phone]

## 2017-04-16 NOTE — Progress Notes (Signed)
Referring Physician: Dr Joelyn Oms  Patient name: [name] [name] MRN: [id number] DOB: 31-Aug-1955 Sex: female  REASON FOR CONSULT: Hemodialysis access  HPI: [name] [name] is a 62 y.o. female who previously had a right radiocephalic AV fistula right forearm AV graft right upper arm AV graft.  This was all in the remote past around 2008.  She subsequently received a kidney transplant.  Her kidney transplant function well from 2009-2018.  It continues to make urine but she does not have very good clearance.  She is sent for placement of a permanent hemodialysis access.  She is left-handed.  She is previously had catheters but these were all on the right side.  Chronic medical problems include diabetes and hypertension which are controlled.  She also has a history of hyperlipidemia.  She currently is on hemodialysis via a right-sided catheter.  Her dialysis schedule is Monday Wednesday Friday.    Past Medical History:  Diagnosis Date  . Adenomatous colon polyp 04/1998  . Anemia   . Carotid artery disease ([place])    Carotid US 1/18: bilateral ICA 1-39, R thyroid lobe nodule (1.9x2.2x3cm); numerous L thyroid lobe nodules - repeat 1 year  . Chronic kidney disease   . Chronic renal failure    post transplant  . Diabetes mellitus   . Esophagitis    Grade 1 Distal  . GERD (gastroesophageal reflux disease)   . Hemorrhoids   . Hx of cardiovascular stress test    Lexiscan Myoview (06/2013):  No ischemia, EF 66%; normal.  //  Myoview 12/17: EF 62, no ischemia or scar; Normal  . Hx of echocardiogram    a. Echocardiogram (06/2013):  Mod focal basal hypertrophy, EF 60-65%, normal wall motion, Gr 1 DD, mild AI, mildly dilated ascending aorta (41 mm), mild LAE.; b.  Echo 9/16: mod LVH, EF 60-65%, no RWMA, Gr 1 DD, trivial AI, mild dilated ascending aorta, mild LAE  . Hyperkalemia   . Hyperlipidemia   . Hypertension   . Metabolic acidosis   . Pneumonia   . Seizures ([place])    Past Surgical History:    Procedure Laterality Date  . ABDOMINAL HYSTERECTOMY    . AV FISTULA PLACEMENT  07/04/2005   Cimino AV fistula  . AV FISTULA PLACEMENT  08/27/2005  . AV FISTULA PLACEMENT W/ PTFE  08/27/2005  . CESAREAN SECTION    . DG AV DIALYSIS GRAFT DECLOT OR  07/24/2005   AV Gore-Tex graf  . DG AV DIALYSIS GRAFT DECLOT OR  Thrombosis right forearm, loop arteriovenous   Thrombosis right forearm, loop arteriovenous graft  . KIDNEY TRANSPLANT  2009   Both  . THROMBECTOMY / ARTERIOVENOUS GRAFT REVISION  10/12/2006  . THROMBECTOMY / ARTERIOVENOUS GRAFT REVISION  10/16/2006    Family History  Problem Relation Age of Onset  . Kidney disease Paternal Aunt   . Heart disease Mother   . Heart disease Father   . Colon cancer Neg Hx     SOCIAL HISTORY: Social History   Socioeconomic History  . Marital status: Widowed    Spouse name: Not on file  . Number of children: 2  . Years of education: 4  . Highest education level: Not on file  Social Needs  . Financial resource strain: Not on file  . Food insecurity - worry: Not on file  . Food insecurity - inability: Not on file  . Transportation needs - medical: Not on file  . Transportation needs - non-medical: Not on file  Occupational History  . Occupation: Investment banker, operational   Tobacco Use  . Smoking status: Never Smoker  . Smokeless tobacco: Never Used  Substance and Sexual Activity  . Alcohol use: No  . Drug use: No  . Sexual activity: No  Other Topics Concern  . Not on file  Social History Narrative   Denies caffeine use     Allergies  Allergen Reactions  . Lisinopril Shortness Of Breath, Swelling and Other (See Comments)    Throat irritation also. Patient takes losartan and tolerates fine.  [name] [Oxaprozin] Dermatitis and Other (See Comments)    hives    Current Outpatient Medications on File Prior to Visit  Medication Sig Dispense Refill  . amLODipine (NORVASC) 10 MG tablet Take 10 mg by mouth daily.    . clonazePAM  (KLONOPIN) 0.5 MG tablet Take 1 tablet (0.5 mg total) by mouth at bedtime. 30 tablet 3  . cloNIDine (CATAPRES) 0.1 MG tablet Take 0.1 mg by mouth 2 (two) times daily.  0  . hydrALAZINE (APRESOLINE) 50 MG tablet Take 1 tablet (50 mg total) by mouth 3 (three) times daily. 90 tablet 1  . isosorbide dinitrate (ISORDIL) 20 MG tablet Take 20 mg by mouth 3 (three) times daily.  0  . labetalol (NORMODYNE) 200 MG tablet Take 200 mg by mouth 3 (three) times daily.  0  . multivitamin (RENA-VIT) TABS tablet Take 1 tablet by mouth daily.  11  . PROAIR HFA 108 (90 Base) MCG/ACT inhaler INHALE 2 PUFFS BY MOUTH EVERY 4 HOURS AS NEEDED FOR WHEEZING/SHORTNESS OF BREATH  0  . QUEtiapine (SEROQUEL) 100 MG tablet Take 100 mg by mouth 3 (three) times daily.  0   No current facility-administered medications on file prior to visit.     ROS:   General:  No weight loss, Fever, chills  HEENT: No recent headaches, no nasal bleeding, no visual changes, no sore throat  Neurologic: No dizziness, blackouts, seizures. No recent symptoms of stroke or mini- stroke. No recent episodes of slurred speech, or temporary blindness.  Cardiac: No recent episodes of chest pain/pressure, no shortness of breath at rest.  No shortness of breath with exertion.  Denies history of atrial fibrillation or irregular heartbeat  Vascular: No history of rest pain in feet.  No history of claudication.  No history of non-healing ulcer, history of DVT and was on Coumadin for several months but has now been off for several months.  \  Pulmonary: No home oxygen, no productive cough, no hemoptysis,  No asthma or wheezing  Musculoskeletal:  [ ]  Arthritis, [ ]  Low back pain,  [ ]  Joint pain  Hematologic:No history of hypercoagulable state.  No history of easy bleeding.  No history of anemia  Gastrointestinal: No hematochezia or melena,  No gastroesophageal reflux, no trouble swallowing  Urinary: [X]  chronic Kidney disease, [X]  on HD - [X]  MWF or [  ] TTHS, [ ]  Burning with urination, [ ]  Frequent urination, [ ]  Difficulty urinating;   Skin: No rashes  Psychological: No history of anxiety,  No history of depression   Physical Examination  Vitals:   04/16/17 1205  BP: 126/82  Pulse: 65  Resp: 20  Temp: 97.9 F (36.6 C)  TempSrc: Oral  SpO2: 99%  Weight: 142 lb 9.6 oz (64.7 kg)  Height: 5\' 5"  (1.651 m)    Body mass index is 23.73 kg/m.  General:  Alert and oriented, no acute distress HEENT: Normal Neck: No bruit or JVD  Pulmonary: Clear to auscultation bilaterally Cardiac: Regular Rate and Rhythm without murmur Abdomen: Soft, non-tender, non-distended, no mass, no scars Skin: No rash Extremity Pulses:  2+ radial, brachial pulses bilaterally, thrombosed right forearm and upper arm graft Musculoskeletal: No deformity or edema  Neurologic: Upper and lower extremity motor 5/5 and symmetric  DATA:  Patient had a duplex ultrasound today of her arteries and veins in her upper extremities.  Right upper extremity shows a less than 3 mm radial artery left is similar brachial artery is 5-6 mm bilaterally.  Right upper arm has a basilic vein that is 3-5 mm.  Left cephalic vein in the upper arm is 3-5 mm.  It is small in the forearm.  Left basilic vein is 5-6 mm.  ASSESSMENT: Patient needs long-term hemodialysis access.  I believe the best option at this point will be to place a left brachiocephalic AV fistula.  Risk benefits possible complications and procedure details including but not limited to bleeding infection ischemic steal non-maturation of the fistula requirement for other access procedures down the road were explained to the patient her son today.  She understands and agrees to proceed.   PLAN: Left brachiocephalic AV fistula scheduled for April 22, 2017.  We will coordinate rearranging her dialysis schedule since this is on her usual dialysis day.   Ruta Hinds, MD [facility]  [place] Office: [phone] Pager: [phone]

## 2017-04-17 DIAGNOSIS — N186 End stage renal disease: Secondary | ICD-10-CM | POA: Diagnosis not present

## 2017-04-17 DIAGNOSIS — D631 Anemia in chronic kidney disease: Secondary | ICD-10-CM | POA: Diagnosis not present

## 2017-04-17 DIAGNOSIS — N2581 Secondary hyperparathyroidism of renal origin: Secondary | ICD-10-CM | POA: Diagnosis not present

## 2017-04-17 DIAGNOSIS — Z23 Encounter for immunization: Secondary | ICD-10-CM | POA: Diagnosis not present

## 2017-04-17 DIAGNOSIS — D509 Iron deficiency anemia, unspecified: Secondary | ICD-10-CM | POA: Diagnosis not present

## 2017-04-20 ENCOUNTER — Other Ambulatory Visit: Payer: Self-pay | Admitting: Family Medicine

## 2017-04-20 DIAGNOSIS — D631 Anemia in chronic kidney disease: Secondary | ICD-10-CM | POA: Diagnosis not present

## 2017-04-20 DIAGNOSIS — Z23 Encounter for immunization: Secondary | ICD-10-CM | POA: Diagnosis not present

## 2017-04-20 DIAGNOSIS — N2581 Secondary hyperparathyroidism of renal origin: Secondary | ICD-10-CM | POA: Diagnosis not present

## 2017-04-20 DIAGNOSIS — I82401 Acute embolism and thrombosis of unspecified deep veins of right lower extremity: Secondary | ICD-10-CM | POA: Diagnosis not present

## 2017-04-20 DIAGNOSIS — N186 End stage renal disease: Secondary | ICD-10-CM | POA: Diagnosis not present

## 2017-04-20 DIAGNOSIS — D509 Iron deficiency anemia, unspecified: Secondary | ICD-10-CM | POA: Diagnosis not present

## 2017-04-23 DIAGNOSIS — N186 End stage renal disease: Secondary | ICD-10-CM | POA: Diagnosis not present

## 2017-04-23 DIAGNOSIS — I150 Renovascular hypertension: Secondary | ICD-10-CM | POA: Diagnosis not present

## 2017-04-23 DIAGNOSIS — D509 Iron deficiency anemia, unspecified: Secondary | ICD-10-CM | POA: Diagnosis not present

## 2017-04-23 DIAGNOSIS — N2581 Secondary hyperparathyroidism of renal origin: Secondary | ICD-10-CM | POA: Diagnosis not present

## 2017-04-23 DIAGNOSIS — D631 Anemia in chronic kidney disease: Secondary | ICD-10-CM | POA: Diagnosis not present

## 2017-04-23 DIAGNOSIS — Z23 Encounter for immunization: Secondary | ICD-10-CM | POA: Diagnosis not present

## 2017-04-23 DIAGNOSIS — Z992 Dependence on renal dialysis: Secondary | ICD-10-CM | POA: Diagnosis not present

## 2017-04-24 DIAGNOSIS — D509 Iron deficiency anemia, unspecified: Secondary | ICD-10-CM | POA: Diagnosis not present

## 2017-04-24 DIAGNOSIS — N2581 Secondary hyperparathyroidism of renal origin: Secondary | ICD-10-CM | POA: Diagnosis not present

## 2017-04-24 DIAGNOSIS — Z23 Encounter for immunization: Secondary | ICD-10-CM | POA: Diagnosis not present

## 2017-04-24 DIAGNOSIS — Z992 Dependence on renal dialysis: Secondary | ICD-10-CM | POA: Diagnosis not present

## 2017-04-24 DIAGNOSIS — I150 Renovascular hypertension: Secondary | ICD-10-CM | POA: Diagnosis not present

## 2017-04-24 DIAGNOSIS — N186 End stage renal disease: Secondary | ICD-10-CM | POA: Diagnosis not present

## 2017-04-27 DIAGNOSIS — I82401 Acute embolism and thrombosis of unspecified deep veins of right lower extremity: Secondary | ICD-10-CM | POA: Diagnosis not present

## 2017-04-27 DIAGNOSIS — D509 Iron deficiency anemia, unspecified: Secondary | ICD-10-CM | POA: Diagnosis not present

## 2017-04-27 DIAGNOSIS — Z23 Encounter for immunization: Secondary | ICD-10-CM | POA: Diagnosis not present

## 2017-04-27 DIAGNOSIS — N186 End stage renal disease: Secondary | ICD-10-CM | POA: Diagnosis not present

## 2017-04-27 DIAGNOSIS — N2581 Secondary hyperparathyroidism of renal origin: Secondary | ICD-10-CM | POA: Diagnosis not present

## 2017-04-28 ENCOUNTER — Encounter (HOSPITAL_COMMUNITY): Payer: Self-pay | Admitting: *Deleted

## 2017-04-29 ENCOUNTER — Telehealth: Payer: Self-pay | Admitting: Vascular Surgery

## 2017-04-29 ENCOUNTER — Encounter (HOSPITAL_COMMUNITY): Payer: Self-pay | Admitting: Urology

## 2017-04-29 ENCOUNTER — Ambulatory Visit (HOSPITAL_COMMUNITY): Payer: Medicare Other | Admitting: Certified Registered Nurse Anesthetist

## 2017-04-29 ENCOUNTER — Ambulatory Visit (HOSPITAL_COMMUNITY)
Admission: RE | Admit: 2017-04-29 | Discharge: 2017-04-29 | Disposition: A | Discharge status: Home / Self Care | Payer: Medicare Other | Source: Ambulatory Visit | Attending: Vascular Surgery | Admitting: Vascular Surgery

## 2017-04-29 ENCOUNTER — Encounter (HOSPITAL_COMMUNITY)
Admission: RE | Disposition: A | Discharge status: Home / Self Care | Payer: Self-pay | Source: Ambulatory Visit | Attending: Vascular Surgery

## 2017-04-29 DIAGNOSIS — I120 Hypertensive chronic kidney disease with stage 5 chronic kidney disease or end stage renal disease: Secondary | ICD-10-CM | POA: Diagnosis not present

## 2017-04-29 DIAGNOSIS — Z940 Kidney transplant status: Secondary | ICD-10-CM | POA: Insufficient documentation

## 2017-04-29 DIAGNOSIS — N185 Chronic kidney disease, stage 5: Secondary | ICD-10-CM | POA: Diagnosis not present

## 2017-04-29 DIAGNOSIS — K219 Gastro-esophageal reflux disease without esophagitis: Secondary | ICD-10-CM | POA: Diagnosis not present

## 2017-04-29 DIAGNOSIS — E1122 Type 2 diabetes mellitus with diabetic chronic kidney disease: Secondary | ICD-10-CM | POA: Diagnosis not present

## 2017-04-29 DIAGNOSIS — Z79899 Other long term (current) drug therapy: Secondary | ICD-10-CM | POA: Diagnosis not present

## 2017-04-29 DIAGNOSIS — N186 End stage renal disease: Secondary | ICD-10-CM | POA: Insufficient documentation

## 2017-04-29 DIAGNOSIS — R569 Unspecified convulsions: Secondary | ICD-10-CM | POA: Insufficient documentation

## 2017-04-29 HISTORY — DX: Hypomagnesemia: E83.42

## 2017-04-29 HISTORY — PX: AV FISTULA PLACEMENT: SHX1204

## 2017-04-29 HISTORY — DX: Unspecified convulsions: R56.9

## 2017-04-29 HISTORY — DX: Acute embolism and thrombosis of other specified veins: I82.890

## 2017-04-29 HISTORY — DX: Infectious mononucleosis, unspecified without complication: B27.90

## 2017-04-29 HISTORY — DX: Encephalitis and encephalomyelitis, unspecified: G04.90

## 2017-04-29 HISTORY — DX: Personal history of other diseases of the circulatory system: Z86.79

## 2017-04-29 LAB — POCT I-STAT 4, (NA,K, GLUC, HGB,HCT)
Glucose, Bld: 113 mg/dL — ABNORMAL HIGH (ref 65–99)
HEMATOCRIT: 37 % (ref 36.0–46.0)
Hemoglobin: 12.6 g/dL (ref 12.0–15.0)
POTASSIUM: 3.9 mmol/L (ref 3.5–5.1)
SODIUM: 142 mmol/L (ref 135–145)

## 2017-04-29 LAB — PROTIME-INR
INR: 1.02
Prothrombin Time: 13.3 seconds (ref 11.4–15.2)

## 2017-04-29 LAB — GLUCOSE, CAPILLARY: Glucose-Capillary: 112 mg/dL — ABNORMAL HIGH (ref 65–99)

## 2017-04-29 SURGERY — ARTERIOVENOUS (AV) FISTULA CREATION
Anesthesia: General | Site: Arm Upper | Laterality: Left

## 2017-04-29 MED ORDER — MIDAZOLAM HCL 2 MG/2ML IJ SOLN
INTRAMUSCULAR | Status: AC
  Filled 2017-04-29: qty 2

## 2017-04-29 MED ORDER — ONDANSETRON HCL 4 MG/2ML IJ SOLN
INTRAMUSCULAR | Status: DC | PRN
  Administered 2017-04-29: 4 mg via INTRAVENOUS

## 2017-04-29 MED ORDER — CHLORHEXIDINE GLUCONATE 4 % EX LIQD: 60.0000 mL | Freq: Once | CUTANEOUS | Status: DC

## 2017-04-29 MED ORDER — SODIUM CHLORIDE 0.9 % IV SOLN
INTRAVENOUS | Status: DC | PRN
  Administered 2017-04-29: 500 mL

## 2017-04-29 MED ORDER — HEPARIN SODIUM (PORCINE) 1000 UNIT/ML IJ SOLN
INTRAMUSCULAR | Status: DC | PRN
  Administered 2017-04-29: 3000 [IU] via INTRAVENOUS

## 2017-04-29 MED ORDER — FENTANYL CITRATE (PF) 250 MCG/5ML IJ SOLN
INTRAMUSCULAR | Status: AC
  Filled 2017-04-29: qty 5

## 2017-04-29 MED ORDER — SODIUM CHLORIDE 0.9 % IV SOLN
INTRAVENOUS | Status: DC
  Administered 2017-04-29: 08:00:00 via INTRAVENOUS

## 2017-04-29 MED ORDER — PROPOFOL 10 MG/ML IV BOLUS
INTRAVENOUS | Status: AC
  Filled 2017-04-29: qty 20

## 2017-04-29 MED ORDER — PHENYLEPHRINE HCL 10 MG/ML IJ SOLN
INTRAMUSCULAR | Status: DC | PRN
Start: 2017-04-29 — End: 2017-04-29
  Administered 2017-04-29: 40 ug via INTRAVENOUS
  Administered 2017-04-29 (×3): 80 ug via INTRAVENOUS
  Administered 2017-04-29: 40 ug via INTRAVENOUS

## 2017-04-29 MED ORDER — PROPOFOL 10 MG/ML IV BOLUS
INTRAVENOUS | Status: DC | PRN
  Administered 2017-04-29: 150 mg via INTRAVENOUS

## 2017-04-29 MED ORDER — LIDOCAINE HCL (PF) 1 % IJ SOLN: INTRAMUSCULAR | Status: DC | PRN

## 2017-04-29 MED ORDER — LABETALOL HCL 200 MG PO TABS: 200.0000 mg | ORAL_TABLET | Freq: Once | ORAL | Status: DC

## 2017-04-29 MED ORDER — 0.9 % SODIUM CHLORIDE (POUR BTL) OPTIME
TOPICAL | Status: DC | PRN
  Administered 2017-04-29: 1000 mL

## 2017-04-29 MED ORDER — MIDAZOLAM HCL 5 MG/5ML IJ SOLN
INTRAMUSCULAR | Status: DC | PRN
  Administered 2017-04-29: 2 mg via INTRAVENOUS

## 2017-04-29 MED ORDER — LIDOCAINE HCL (PF) 1 % IJ SOLN
INTRAMUSCULAR | Status: AC
  Filled 2017-04-29: qty 30

## 2017-04-29 MED ORDER — HYDROCODONE-ACETAMINOPHEN 5-325 MG PO TABS: 1.0000 | ORAL_TABLET | Freq: Four times a day (QID) | ORAL | 0 refills | Status: DC | PRN

## 2017-04-29 MED ORDER — FENTANYL CITRATE (PF) 250 MCG/5ML IJ SOLN
INTRAMUSCULAR | Status: DC | PRN
  Administered 2017-04-29: 50 ug via INTRAVENOUS

## 2017-04-29 MED ORDER — CEFUROXIME SODIUM 1.5 G IV SOLR
1.5000 g | INTRAVENOUS | Status: AC
  Administered 2017-04-29: 1.5 g via INTRAVENOUS

## 2017-04-29 MED ORDER — DEXTROSE 5 % IV SOLN
INTRAVENOUS | Status: AC
  Filled 2017-04-29: qty 1.5

## 2017-04-29 MED ORDER — LIDOCAINE HCL (CARDIAC) 20 MG/ML IV SOLN
INTRAVENOUS | Status: DC | PRN
  Administered 2017-04-29: 100 mg via INTRAVENOUS

## 2017-04-29 MED ORDER — LABETALOL HCL 200 MG PO TABS
200.0000 mg | ORAL_TABLET | ORAL | Status: AC
  Administered 2017-04-29: 200 mg via ORAL
  Filled 2017-04-29: qty 1

## 2017-04-29 SURGICAL SUPPLY — 37 items
ADH SKN CLS APL DERMABOND .7 (GAUZE/BANDAGES/DRESSINGS) ×1
ARMBAND PINK RESTRICT EXTREMIT (MISCELLANEOUS) ×4 IMPLANT
CANISTER SUCT 3000ML PPV (MISCELLANEOUS) ×2 IMPLANT
CANNULA VESSEL 3MM 2 BLNT TIP (CANNULA) ×2 IMPLANT
CLIP VESOCCLUDE MED 6/CT (CLIP) ×2 IMPLANT
CLIP VESOCCLUDE SM WIDE 6/CT (CLIP) ×2 IMPLANT
COVER PROBE W GEL 5X96 (DRAPES) IMPLANT
DECANTER SPIKE VIAL GLASS SM (MISCELLANEOUS) ×2 IMPLANT
DERMABOND ADVANCED (GAUZE/BANDAGES/DRESSINGS) ×1
DERMABOND ADVANCED .7 DNX12 (GAUZE/BANDAGES/DRESSINGS) ×1 IMPLANT
DRAIN PENROSE 1/4X12 LTX STRL (WOUND CARE) ×2 IMPLANT
ELECT REM PT RETURN 9FT ADLT (ELECTROSURGICAL) ×2
ELECTRODE REM PT RTRN 9FT ADLT (ELECTROSURGICAL) ×1 IMPLANT
GLOVE BIO SURGEON STRL SZ7 (GLOVE) ×1 IMPLANT
GLOVE BIO SURGEON STRL SZ7.5 (GLOVE) ×2 IMPLANT
GLOVE BIOGEL PI IND STRL 6.5 (GLOVE) IMPLANT
GLOVE BIOGEL PI INDICATOR 6.5 (GLOVE) ×1
GLOVE ECLIPSE 7.5 STRL STRAW (GLOVE) ×2 IMPLANT
GOWN STRL REUS W/ TWL LRG LVL3 (GOWN DISPOSABLE) ×3 IMPLANT
GOWN STRL REUS W/TWL LRG LVL3 (GOWN DISPOSABLE) ×6
KIT BASIN OR (CUSTOM PROCEDURE TRAY) ×2 IMPLANT
KIT ROOM TURNOVER OR (KITS) ×2 IMPLANT
LOOP VESSEL MINI RED (MISCELLANEOUS) IMPLANT
NS IRRIG 1000ML POUR BTL (IV SOLUTION) ×2 IMPLANT
PACK CV ACCESS (CUSTOM PROCEDURE TRAY) ×2 IMPLANT
PAD ARMBOARD 7.5X6 YLW CONV (MISCELLANEOUS) ×4 IMPLANT
SPONGE SURGIFOAM ABS GEL 100 (HEMOSTASIS) IMPLANT
SUT PROLENE 6 0 BV (SUTURE) ×2 IMPLANT
SUT PROLENE 7 0 BV 1 (SUTURE) ×2 IMPLANT
SUT SILK 3 0 (SUTURE) ×2
SUT SILK 3-0 18XBRD TIE 12 (SUTURE) IMPLANT
SUT VIC AB 3-0 SH 27 (SUTURE) ×2
SUT VIC AB 3-0 SH 27X BRD (SUTURE) ×1 IMPLANT
SUT VICRYL 4-0 PS2 18IN ABS (SUTURE) ×2 IMPLANT
TOWEL GREEN STERILE (TOWEL DISPOSABLE) ×2 IMPLANT
UNDERPAD 30X30 (UNDERPADS AND DIAPERS) ×2 IMPLANT
WATER STERILE IRR 1000ML POUR (IV SOLUTION) ×2 IMPLANT

## 2017-04-29 NOTE — Interval H&P Note (Signed)
History and Physical Interval Note:  04/29/2017 7:29 AM  [name] [name]  has presented today for surgery, with the diagnosis of end stage renal disease  The various methods of treatment have been discussed with the patient and family. After consideration of risks, benefits and other options for treatment, the patient has consented to  Procedure(s): ARTERIOVENOUS (AV) BRACHIOCEPHALIC FISTULA CREATION (Left) as a surgical intervention .  The patient's history has been reviewed, patient examined, no change in status, stable for surgery.  I have reviewed the patient's chart and labs.  Questions were answered to the patient's satisfaction.     Ruta Hinds

## 2017-04-29 NOTE — Telephone Encounter (Signed)
Sched lab 05/29/17 at 4:00 and MD 06/04/17 at 11:30. Neither pt's hm nor cell # has a vm. Lm on son's # to inform them of appts.

## 2017-04-29 NOTE — Anesthesia Postprocedure Evaluation (Signed)
Anesthesia Post Note  Patient: [name] [name]  Procedure(s) Performed: Creation of Left Arm ARTERIOVENOUS BRACHIOCEPHALIC FISTULA (Left Arm Upper)     Patient location during evaluation: PACU Anesthesia Type: General Level of consciousness: awake and alert Pain management: pain level controlled Vital Signs Assessment: post-procedure vital signs reviewed and stable Respiratory status: spontaneous breathing, nonlabored ventilation, respiratory function stable and patient connected to nasal cannula oxygen Cardiovascular status: blood pressure returned to baseline and stable Postop Assessment: no apparent nausea or vomiting Anesthetic complications: no    Last Vitals:  Vitals:   04/29/17 1100 04/29/17 1120  BP: (!) 150/86 (!) 153/87  Pulse: 62 64  Resp: (!) 9 15  Temp: 36.4 C   SpO2: 96% 100%    Last Pain:  Vitals:   04/29/17 1120  TempSrc:   PainSc: 0-No pain                 Leara Rawl DAVID

## 2017-04-29 NOTE — Discharge Instructions (Signed)
° °  [facility] ° °Discharge Instructions ° °AV Fistula or Graft Surgery for Dialysis Access ° °Please refer to the following instructions for your post-procedure care. Your surgeon or physician assistant will discuss any changes with you. ° °Activity ° °You may drive the day following your surgery, if you are comfortable and no longer taking prescription pain medication. Resume full activity as the soreness in your incision resolves. ° °Bathing/Showering ° °You may shower after you go home. Keep your incision dry for 48 hours. Do not soak in a bathtub, hot tub, or swim until the incision heals completely. You may not shower if you have a hemodialysis catheter. ° °Incision Care ° °Clean your incision with mild soap and water after 48 hours. Pat the area dry with a clean towel. You do not need a bandage unless otherwise instructed. Do not apply any ointments or creams to your incision. You may have skin glue on your incision. Do not peel it off. It will come off on its own in about one week. Your arm may swell a bit after surgery. To reduce swelling use pillows to elevate your arm so it is above your heart. Your doctor will tell you if you need to lightly wrap your arm with an ACE bandage. ° °Diet ° °Resume your normal diet. There are not special food restrictions following this procedure. In order to heal from your surgery, it is CRITICAL to get adequate nutrition. Your body requires vitamins, minerals, and protein. Vegetables are the best source of vitamins and minerals. Vegetables also provide the perfect balance of protein. Processed food has little nutritional value, so try to avoid this. ° °Medications ° °Resume taking all of your medications. If your incision is causing pain, you may take over-the counter pain relievers such as acetaminophen (Tylenol). If you were prescribed a stronger pain medication, please be aware these medications can cause nausea and constipation. Prevent  nausea by taking the medication with a snack or meal. Avoid constipation by drinking plenty of fluids and eating foods with high amount of fiber, such as fruits, vegetables, and grains. Do not take Tylenol if you are taking prescription pain medications. ° ° ° ° °Follow up °Your surgeon may want to see you in the office following your access surgery. If so, this will be arranged at the time of your surgery. ° °Please call us immediately for any of the following conditions: ° °Increased pain, redness, drainage (pus) from your incision site °Fever of 101 degrees or higher °Severe or worsening pain at your incision site °Hand pain or numbness. ° °Reduce your risk of vascular disease: ° °Stop smoking. If you would like help, call [facility] at 1-800-QUIT-NOW (1-800-784-8669) or  at 336-586-4000 ° °Manage your cholesterol °Maintain a desired weight °Control your diabetes °Keep your blood pressure down ° °Dialysis ° °It will take several weeks to several months for your new dialysis access to be ready for use. Your surgeon will determine when it is OK to use it. Your nephrologist will continue to direct your dialysis. You can continue to use your Permcath until your new access is ready for use. ° °If you have any questions, please call the office at [phone]. ° °

## 2017-04-29 NOTE — Anesthesia Preprocedure Evaluation (Addendum)
Anesthesia Evaluation  Patient identified by MRN, date of birth, ID band Patient awake    Reviewed: Allergy & Precautions, NPO status , Patient's Chart, lab work & pertinent test results  Airway Mallampati: I  TM Distance: >3 FB Neck ROM: Full    Dental  (+) Teeth Intact, Dental Advisory Given   Pulmonary    Pulmonary exam normal        Cardiovascular hypertension, Pt. on medications Normal cardiovascular exam     Neuro/Psych    GI/Hepatic GERD  Medicated and Controlled,  Endo/Other  diabetes, Type 2, Oral Hypoglycemic Agents  Renal/GU Dialysis and ESRFRenal disease     Musculoskeletal   Abdominal   Peds  Hematology   Anesthesia Other Findings   Reproductive/Obstetrics                            Anesthesia Physical Anesthesia Plan  ASA: III  Anesthesia Plan: General   Post-op Pain Management:    Induction: Intravenous  PONV Risk Score and Plan: 3 and Ondansetron and Midazolam  Airway Management Planned: LMA  Additional Equipment:   Intra-op Plan:   Post-operative Plan: Extubation in OR  Informed Consent: I have reviewed the patients History and Physical, chart, labs and discussed the procedure including the risks, benefits and alternatives for the proposed anesthesia with the patient or authorized representative who has indicated his/her understanding and acceptance.     Plan Discussed with: CRNA and Surgeon  Anesthesia Plan Comments:         Anesthesia Quick Evaluation

## 2017-04-29 NOTE — Telephone Encounter (Signed)
-----   Message from Mena Goes, RN sent at 04/29/2017 10:34 AM EST ----- Regarding: 4-6 weeks postop   ----- Message ----- From: Iline Oven Sent: 04/29/2017   9:53 AM To: Vvs Charge Pool  Can you schedule an appt for this pt in about 4-6 weeks.  No need for fistula duplex per Dr. Oneida Alar.  PO L brachiocephalic. Thanks, [facility]

## 2017-04-29 NOTE — Op Note (Signed)
Procedure: Left Brachial Cephalic AV fistula  Preop: ESRD  Postop: ESRD  Anesthesia: General  Assistant: Arlee Muslim, PA-C  Findings: 2.5 mm cephalic vein  Procedure: After obtaining informed consent, the patient was taken to the operating room.  After induction of general anesthesia, the left upper extremity was prepped and draped in usual sterile fashion.  A transverse incision was then made near the antecubital crease the left arm. The incision was carried into the subcutaneous tissues down to level of the cephalic vein. The cephalic vein was approximately 2.5 mm in diameter. It was of good quality. This was dissected free circumferentially and small side branches ligated and divided between silk ties or clips. Next the brachial artery was dissected free in the medial portion of the incision. The artery was  2.5 mm in diameter. The vessel loops were placed proximal and distal to the planned site of arteriotomy. The patient was given 3000 units of intravenous heparin. After appropriate circulation time, the vessel loops were used to control the artery. A longitudinal opening was made in the brachial artery.  The vein was ligated distally with a 2-0 silk tie. The vein was controlled proximally with a fine bulldog clamp. The vein was then swung over to the artery and sewn end of vein to side of artery using a running 7-0 Prolene suture. Just prior to completion of the anastomosis, everything was fore bled back bled and thoroughly flushed. The anastomosis was secured, vessel loops released, and there was a palpable thrill in the fistula immediately. After hemostasis was obtained, the subcutaneous tissues were reapproximated using a running 3-0 Vicryl suture. The skin was then closed with a 4 Vicryl subcuticular stitch. Dermabond was applied to the skin incision.  The patient had a palpable radial pulse at the end of the case.  Ruta Hinds, MD [facility] [place] Office:  [phone] Pager: [phone]

## 2017-04-29 NOTE — Transfer of Care (Signed)
Immediate Anesthesia Transfer of Care Note  Patient: [name] [name]  Procedure(s) Performed: Creation of Left Arm ARTERIOVENOUS BRACHIOCEPHALIC FISTULA (Left Arm Upper)  Patient Location: PACU  Anesthesia Type:General  Level of Consciousness: awake, patient cooperative and responds to stimulation  Airway & Oxygen Therapy: Patient Spontanous Breathing and Patient connected to face mask oxygen  Post-op Assessment: Report given to RN, Post -op Vital signs reviewed and stable and Patient moving all extremities X 4  Post vital signs: Reviewed and stable  Last Vitals:  Vitals:   04/29/17 0705 04/29/17 0716  BP: (!) 195/99 (!) 176/97  Pulse: 60 61  Resp: 18   Temp: 36.6 C   SpO2: 100% 100%    Last Pain:  Vitals:   04/29/17 0705  TempSrc: Oral      Patients Stated Pain Goal: 2 (57/26/20 3559)  Complications: No apparent anesthesia complications

## 2017-04-29 NOTE — Anesthesia Procedure Notes (Signed)
Procedure Name: MAC Date/Time: 04/29/2017 8:38 AM Performed by: Glynda Jaeger, CRNA Pre-anesthesia Checklist: Patient identified, Emergency Drugs available, Suction available, Timeout performed and Patient being monitored Patient Re-evaluated:Patient Re-evaluated prior to induction Oxygen Delivery Method: Circle system utilized Preoxygenation: Pre-oxygenation with 100% oxygen Induction Type: IV induction Ventilation: Mask ventilation without difficulty LMA: LMA inserted Laryngoscope Size: 4 Number of attempts: 1 Placement Confirmation: positive ETCO2 Tube secured with: Tape Dental Injury: Teeth and Oropharynx as per pre-operative assessment

## 2017-04-30 ENCOUNTER — Encounter (HOSPITAL_COMMUNITY): Payer: Self-pay | Admitting: Vascular Surgery

## 2017-04-30 ENCOUNTER — Telehealth: Payer: Self-pay | Admitting: Family Medicine

## 2017-04-30 DIAGNOSIS — Z23 Encounter for immunization: Secondary | ICD-10-CM | POA: Diagnosis not present

## 2017-04-30 DIAGNOSIS — N186 End stage renal disease: Secondary | ICD-10-CM | POA: Diagnosis not present

## 2017-04-30 DIAGNOSIS — N2581 Secondary hyperparathyroidism of renal origin: Secondary | ICD-10-CM | POA: Diagnosis not present

## 2017-04-30 DIAGNOSIS — D509 Iron deficiency anemia, unspecified: Secondary | ICD-10-CM | POA: Diagnosis not present

## 2017-04-30 NOTE — Telephone Encounter (Signed)
Copied from [place]. Topic: General - Other >> Apr 30, 2017  9:32 AM Lolita Rieger, RMA wrote: Reason for CRM: pt daughter called and wanted to speak to a nurse about adjusting her mothers medication due to her being so sleepy all the time Please contact [phone] to discuss what can be done daughter name is Museum/gallery exhibitions officer

## 2017-04-30 NOTE — Telephone Encounter (Signed)
Patient's daughter states that patient is sleeping at night - but she is also sleeping all day long.  She will get up and go from one room to the other, and by the time she gets to the couch she falls right to sleep.  She states that she constantly falls into a deep sleep.  Advised that an appointment be made.   Daughter scheduled appointment for 05/07/17 at 10:30am.

## 2017-05-01 DIAGNOSIS — D509 Iron deficiency anemia, unspecified: Secondary | ICD-10-CM | POA: Diagnosis not present

## 2017-05-01 DIAGNOSIS — N186 End stage renal disease: Secondary | ICD-10-CM | POA: Diagnosis not present

## 2017-05-01 DIAGNOSIS — N2581 Secondary hyperparathyroidism of renal origin: Secondary | ICD-10-CM | POA: Diagnosis not present

## 2017-05-01 DIAGNOSIS — Z23 Encounter for immunization: Secondary | ICD-10-CM | POA: Diagnosis not present

## 2017-05-04 ENCOUNTER — Other Ambulatory Visit: Payer: Self-pay

## 2017-05-04 DIAGNOSIS — Z23 Encounter for immunization: Secondary | ICD-10-CM | POA: Diagnosis not present

## 2017-05-04 DIAGNOSIS — N2581 Secondary hyperparathyroidism of renal origin: Secondary | ICD-10-CM | POA: Diagnosis not present

## 2017-05-04 DIAGNOSIS — N186 End stage renal disease: Secondary | ICD-10-CM | POA: Diagnosis not present

## 2017-05-04 DIAGNOSIS — D509 Iron deficiency anemia, unspecified: Secondary | ICD-10-CM | POA: Diagnosis not present

## 2017-05-04 DIAGNOSIS — I82401 Acute embolism and thrombosis of unspecified deep veins of right lower extremity: Secondary | ICD-10-CM | POA: Diagnosis not present

## 2017-05-06 DIAGNOSIS — Z23 Encounter for immunization: Secondary | ICD-10-CM | POA: Diagnosis not present

## 2017-05-06 DIAGNOSIS — D509 Iron deficiency anemia, unspecified: Secondary | ICD-10-CM | POA: Diagnosis not present

## 2017-05-06 DIAGNOSIS — N2581 Secondary hyperparathyroidism of renal origin: Secondary | ICD-10-CM | POA: Diagnosis not present

## 2017-05-06 DIAGNOSIS — N186 End stage renal disease: Secondary | ICD-10-CM | POA: Diagnosis not present

## 2017-05-07 ENCOUNTER — Ambulatory Visit (INDEPENDENT_AMBULATORY_CARE_PROVIDER_SITE_OTHER): Payer: Medicare Other | Admitting: Family Medicine

## 2017-05-07 ENCOUNTER — Encounter: Payer: Self-pay | Admitting: General Practice

## 2017-05-07 ENCOUNTER — Other Ambulatory Visit: Payer: Self-pay

## 2017-05-07 ENCOUNTER — Encounter: Payer: Self-pay | Admitting: Family Medicine

## 2017-05-07 VITALS — BP 118/72 | HR 76 | Temp 98.6°F | Resp 17 | Ht 65.0 in | Wt 154.0 lb

## 2017-05-07 DIAGNOSIS — H04203 Unspecified epiphora, bilateral lacrimal glands: Secondary | ICD-10-CM | POA: Diagnosis not present

## 2017-05-07 DIAGNOSIS — K219 Gastro-esophageal reflux disease without esophagitis: Secondary | ICD-10-CM

## 2017-05-07 DIAGNOSIS — R5383 Other fatigue: Secondary | ICD-10-CM

## 2017-05-07 DIAGNOSIS — Z23 Encounter for immunization: Secondary | ICD-10-CM | POA: Diagnosis not present

## 2017-05-07 LAB — BASIC METABOLIC PANEL
BUN: 46 mg/dL — ABNORMAL HIGH (ref 6–23)
CHLORIDE: 95 meq/L — AB (ref 96–112)
CO2: 31 mEq/L (ref 19–32)
Calcium: 9.7 mg/dL (ref 8.4–10.5)
Creatinine, Ser: 4.95 mg/dL (ref 0.40–1.20)
GFR: 11.52 mL/min — AB (ref >60.00)
Glucose, Bld: 102 mg/dL — ABNORMAL HIGH (ref 70–99)
Potassium: 4 mEq/L (ref 3.5–5.1)
Sodium: 139 mEq/L (ref 135–145)

## 2017-05-07 LAB — HEPATIC FUNCTION PANEL
ALK PHOS: 99 U/L (ref 39–117)
ALT: 20 U/L (ref 0–35)
AST: 21 U/L (ref 0–37)
Albumin: 4.3 g/dL (ref 3.5–5.2)
BILIRUBIN DIRECT: 0.1 mg/dL (ref 0.0–0.3)
BILIRUBIN TOTAL: 0.4 mg/dL (ref 0.2–1.2)
Total Protein: 7.3 g/dL (ref 6.0–8.3)

## 2017-05-07 LAB — CBC WITH DIFFERENTIAL/PLATELET
BASOS ABS: 0.1 10*3/uL (ref 0.0–0.1)
Basophils Relative: 1.1 % (ref 0.0–3.0)
EOS ABS: 0.3 10*3/uL (ref 0.0–0.7)
EOS PCT: 3.8 % (ref 0.0–5.0)
HCT: 38.4 % (ref 36.0–46.0)
Hemoglobin: 12.6 g/dL (ref 12.0–15.0)
Lymphocytes Relative: 31.1 % (ref 12.0–46.0)
Lymphs Abs: 2.7 10*3/uL (ref 0.7–4.0)
MCHC: 32.9 g/dL (ref 30.0–36.0)
MCV: 94.2 fl (ref 78.0–100.0)
MONO ABS: 1.5 10*3/uL — AB (ref 0.1–1.0)
Monocytes Relative: 17.3 % — ABNORMAL HIGH (ref 3.0–12.0)
Neutro Abs: 4 10*3/uL (ref 1.4–7.7)
Neutrophils Relative %: 46.7 % (ref 43.0–77.0)
Platelets: 208 10*3/uL (ref 150.0–400.0)
RBC: 4.08 Mil/uL (ref 3.87–5.11)
RDW: 16.5 % — ABNORMAL HIGH (ref 11.5–15.5)
WBC: 8.6 10*3/uL (ref 4.0–10.5)

## 2017-05-07 LAB — TSH: TSH: 0.97 u[IU]/mL (ref 0.35–4.50)

## 2017-05-07 MED ORDER — CETIRIZINE HCL 10 MG PO TABS: 10.0000 mg | ORAL_TABLET | Freq: Every day | ORAL | 11 refills | Status: DC

## 2017-05-07 MED ORDER — OMEPRAZOLE 40 MG PO CPDR: 40.0000 mg | DELAYED_RELEASE_CAPSULE | Freq: Every day | ORAL | 3 refills | Status: DC

## 2017-05-07 MED ORDER — TRAZODONE HCL 50 MG PO TABS: 25.0000 mg | ORAL_TABLET | Freq: Every evening | ORAL | 3 refills | Status: DC | PRN

## 2017-05-07 NOTE — Assessment & Plan Note (Signed)
New.  Pt's sxs and PE consistent w/ seasonal allergies.  Start Zyrtec daily.  Pt expressed understanding and is in agreement w/ plan.

## 2017-05-07 NOTE — Assessment & Plan Note (Signed)
Deteriorated.  Start PPI.  Reviewed dietary and lifestyle modifications that will help sxs.  Will follow.

## 2017-05-07 NOTE — Patient Instructions (Addendum)
Follow up in 1 month to recheck sleep We'll notify you of your lab results and make any changes if needed START the Trazodone nightly- start w/ 1/2 tab and increase to a whole tab if needed START the Zyrtec daily to improve allergy congestion and watery eyes START the Omeprazole daily to improve acid reflux Call with any questions or concerns Happy Valentine's Day!!!

## 2017-05-07 NOTE — Progress Notes (Signed)
   Subjective:    Patient ID: [name] [name], female    DOB: 1955-04-17, 62 y.o.   MRN: [id number]  HPI Fatigue- pt is on HD M/W/F.  Reports fatigue daily.  Pt reports she is waking frequently at night.  Has difficulty falling back to sleep once awake.  'teary eyed a lot'- denies feeling sad or down.  Reports she's not crying but eyes are watering.  + nasal congestion, sneezing.  GERD- worse after eating.  Has sensation of being 'stuck' in the throat.  No vomiting.   Review of Systems For ROS see HPI     Objective:   Physical Exam  Constitutional: She is oriented to person, place, and time. She appears well-developed and well-nourished. No distress.  HENT:  Head: Normocephalic and atraumatic.  Right Ear: Tympanic membrane normal.  Left Ear: Tympanic membrane normal.  Nose: Mucosal edema and rhinorrhea present. Right sinus exhibits no maxillary sinus tenderness and no frontal sinus tenderness. Left sinus exhibits no maxillary sinus tenderness and no frontal sinus tenderness.  Mouth/Throat: Mucous membranes are normal. Posterior oropharyngeal erythema (w/ PND) present.  Eyes: Conjunctivae and EOM are normal. Pupils are equal, round, and reactive to light.  Neck: Normal range of motion. Neck supple.  Cardiovascular: Normal rate, regular rhythm and normal heart sounds.  Pulmonary/Chest: Effort normal and breath sounds normal. No respiratory distress. She has no wheezes. She has no rales.  Abdominal: Soft. Bowel sounds are normal. She exhibits no distension. There is no tenderness. There is no rebound and no guarding.  Lymphadenopathy:    She has no cervical adenopathy.  Neurological: She is alert and oriented to person, place, and time.  Skin: Skin is warm and dry.  Psychiatric: She has a normal mood and affect. Her behavior is normal. Thought content normal.  Vitals reviewed.         Assessment & Plan:  Fatigue- new.  Suspect this is due to her 3x/week HD as well as a  component of depression/anxiety.  Discussed that her poor sleep is also contributing.  Will start Trazodone to help w/ sleep.  Will check labs to r/o metabolic cause.  Will continue to monitor closely.  Pt expressed understanding and is in agreement w/ plan.

## 2017-05-08 DIAGNOSIS — N186 End stage renal disease: Secondary | ICD-10-CM | POA: Diagnosis not present

## 2017-05-08 DIAGNOSIS — D509 Iron deficiency anemia, unspecified: Secondary | ICD-10-CM | POA: Diagnosis not present

## 2017-05-08 DIAGNOSIS — N2581 Secondary hyperparathyroidism of renal origin: Secondary | ICD-10-CM | POA: Diagnosis not present

## 2017-05-08 DIAGNOSIS — Z23 Encounter for immunization: Secondary | ICD-10-CM | POA: Diagnosis not present

## 2017-05-08 NOTE — Addendum Note (Signed)
Addended by: Davis Gourd on: 05/08/2017 03:58 PM   Modules accepted: Orders

## 2017-05-11 DIAGNOSIS — N186 End stage renal disease: Secondary | ICD-10-CM | POA: Diagnosis not present

## 2017-05-11 DIAGNOSIS — D509 Iron deficiency anemia, unspecified: Secondary | ICD-10-CM | POA: Diagnosis not present

## 2017-05-11 DIAGNOSIS — N2581 Secondary hyperparathyroidism of renal origin: Secondary | ICD-10-CM | POA: Diagnosis not present

## 2017-05-11 DIAGNOSIS — Z23 Encounter for immunization: Secondary | ICD-10-CM | POA: Diagnosis not present

## 2017-05-13 DIAGNOSIS — N186 End stage renal disease: Secondary | ICD-10-CM | POA: Diagnosis not present

## 2017-05-13 DIAGNOSIS — D509 Iron deficiency anemia, unspecified: Secondary | ICD-10-CM | POA: Diagnosis not present

## 2017-05-13 DIAGNOSIS — Z23 Encounter for immunization: Secondary | ICD-10-CM | POA: Diagnosis not present

## 2017-05-13 DIAGNOSIS — N2581 Secondary hyperparathyroidism of renal origin: Secondary | ICD-10-CM | POA: Diagnosis not present

## 2017-05-14 ENCOUNTER — Telehealth: Payer: Self-pay | Admitting: Family Medicine

## 2017-05-14 NOTE — Telephone Encounter (Signed)
Patient daughter notified of PCP recommendations and is agreement and expresses an understanding.   [place] for [facility] to Discuss results / PCP recommendations / Schedule patient.

## 2017-05-14 NOTE — Telephone Encounter (Signed)
This is an iron supplement and likely prescribed by renal.  The name of the prescriber should be on the bottle.

## 2017-05-14 NOTE — Telephone Encounter (Signed)
Please advise, I do not see this on pt med list

## 2017-05-14 NOTE — Telephone Encounter (Signed)
Copied from [place]. Topic: Quick Communication - See Telephone Encounter >> May 14, 2017 10:59 AM Synthia Innocent wrote: CRM for notification. See Telephone encounter for: Received prescription auryxia in the mail, is she to take this? Dr Birdie Riddle prescribed?   05/14/17.

## 2017-05-15 DIAGNOSIS — N186 End stage renal disease: Secondary | ICD-10-CM | POA: Diagnosis not present

## 2017-05-15 DIAGNOSIS — Z23 Encounter for immunization: Secondary | ICD-10-CM | POA: Diagnosis not present

## 2017-05-15 DIAGNOSIS — D509 Iron deficiency anemia, unspecified: Secondary | ICD-10-CM | POA: Diagnosis not present

## 2017-05-15 DIAGNOSIS — N2581 Secondary hyperparathyroidism of renal origin: Secondary | ICD-10-CM | POA: Diagnosis not present

## 2017-05-18 DIAGNOSIS — I82401 Acute embolism and thrombosis of unspecified deep veins of right lower extremity: Secondary | ICD-10-CM | POA: Diagnosis not present

## 2017-05-18 DIAGNOSIS — Z23 Encounter for immunization: Secondary | ICD-10-CM | POA: Diagnosis not present

## 2017-05-18 DIAGNOSIS — N186 End stage renal disease: Secondary | ICD-10-CM | POA: Diagnosis not present

## 2017-05-18 DIAGNOSIS — D509 Iron deficiency anemia, unspecified: Secondary | ICD-10-CM | POA: Diagnosis not present

## 2017-05-18 DIAGNOSIS — N2581 Secondary hyperparathyroidism of renal origin: Secondary | ICD-10-CM | POA: Diagnosis not present

## 2017-05-20 ENCOUNTER — Telehealth: Payer: Self-pay | Admitting: Vascular Surgery

## 2017-05-20 DIAGNOSIS — Z23 Encounter for immunization: Secondary | ICD-10-CM | POA: Diagnosis not present

## 2017-05-20 DIAGNOSIS — N2581 Secondary hyperparathyroidism of renal origin: Secondary | ICD-10-CM | POA: Diagnosis not present

## 2017-05-20 DIAGNOSIS — D509 Iron deficiency anemia, unspecified: Secondary | ICD-10-CM | POA: Diagnosis not present

## 2017-05-20 DIAGNOSIS — N186 End stage renal disease: Secondary | ICD-10-CM | POA: Diagnosis not present

## 2017-05-20 NOTE — Telephone Encounter (Signed)
Sched appt 05/28/17 at 3:30 with the PA. Spoke to daughter to sch appt.

## 2017-05-20 NOTE — Telephone Encounter (Signed)
-----   Message from Mena Goes, RN sent at 05/20/2017  2:29 PM EST ----- Regarding: FW: weak AVF     MOVE UP APPT   ----- Message ----- From: Elam Dutch, MD Sent: 05/20/2017   2:18 PM To: Vvs Charge Pool Subject: FW: weak AVF                                   Can you move up follow up appt.  She can see anyone available  Juanda Crumble  ----- Message ----- From: Rexene Agent, MD Sent: 05/20/2017   1:57 PM To: Elam Dutch, MD Subject: weak AVF                                       Hi Charles I'm seeing pt here at dialysis after AVF in LUE earlier in the month.  This week no real thrill palpable and very very weak bruit.  It was more robust previously.  Has 3/14 f/u with you but I'm worried about it and wanted to make you aware.    [facility] [phone]

## 2017-05-22 DIAGNOSIS — D509 Iron deficiency anemia, unspecified: Secondary | ICD-10-CM | POA: Diagnosis not present

## 2017-05-22 DIAGNOSIS — Z23 Encounter for immunization: Secondary | ICD-10-CM | POA: Diagnosis not present

## 2017-05-22 DIAGNOSIS — N186 End stage renal disease: Secondary | ICD-10-CM | POA: Diagnosis not present

## 2017-05-22 DIAGNOSIS — I150 Renovascular hypertension: Secondary | ICD-10-CM | POA: Diagnosis not present

## 2017-05-22 DIAGNOSIS — N2581 Secondary hyperparathyroidism of renal origin: Secondary | ICD-10-CM | POA: Diagnosis not present

## 2017-05-22 DIAGNOSIS — Z992 Dependence on renal dialysis: Secondary | ICD-10-CM | POA: Diagnosis not present

## 2017-05-23 ENCOUNTER — Other Ambulatory Visit: Payer: Self-pay | Admitting: Family Medicine

## 2017-05-25 DIAGNOSIS — D509 Iron deficiency anemia, unspecified: Secondary | ICD-10-CM | POA: Diagnosis not present

## 2017-05-25 DIAGNOSIS — Z23 Encounter for immunization: Secondary | ICD-10-CM | POA: Diagnosis not present

## 2017-05-25 DIAGNOSIS — N2581 Secondary hyperparathyroidism of renal origin: Secondary | ICD-10-CM | POA: Diagnosis not present

## 2017-05-25 DIAGNOSIS — N186 End stage renal disease: Secondary | ICD-10-CM | POA: Diagnosis not present

## 2017-05-25 DIAGNOSIS — I82401 Acute embolism and thrombosis of unspecified deep veins of right lower extremity: Secondary | ICD-10-CM | POA: Diagnosis not present

## 2017-05-27 DIAGNOSIS — N2581 Secondary hyperparathyroidism of renal origin: Secondary | ICD-10-CM | POA: Diagnosis not present

## 2017-05-27 DIAGNOSIS — D509 Iron deficiency anemia, unspecified: Secondary | ICD-10-CM | POA: Diagnosis not present

## 2017-05-27 DIAGNOSIS — Z23 Encounter for immunization: Secondary | ICD-10-CM | POA: Diagnosis not present

## 2017-05-27 DIAGNOSIS — N186 End stage renal disease: Secondary | ICD-10-CM | POA: Diagnosis not present

## 2017-05-28 ENCOUNTER — Encounter: Payer: Self-pay | Admitting: *Deleted

## 2017-05-28 ENCOUNTER — Other Ambulatory Visit: Payer: Self-pay | Admitting: *Deleted

## 2017-05-28 ENCOUNTER — Ambulatory Visit (INDEPENDENT_AMBULATORY_CARE_PROVIDER_SITE_OTHER): Payer: Self-pay | Admitting: Vascular Surgery

## 2017-05-28 ENCOUNTER — Encounter: Payer: Self-pay | Admitting: Vascular Surgery

## 2017-05-28 ENCOUNTER — Other Ambulatory Visit: Payer: Self-pay

## 2017-05-28 VITALS — BP 138/83 | HR 80 | Temp 99.6°F | Resp 14 | Ht 65.0 in | Wt 155.0 lb

## 2017-05-28 DIAGNOSIS — N186 End stage renal disease: Secondary | ICD-10-CM

## 2017-05-28 DIAGNOSIS — Z992 Dependence on renal dialysis: Secondary | ICD-10-CM

## 2017-05-28 NOTE — Progress Notes (Signed)
POST OPERATIVE OFFICE NOTE    CC:  F/u for surgery  HPI:  This is a 62 y.o. female who is s/p Left brachial cephalic fistula creation 04/29/2017.  She is currently on HD vis right TDC.  Her dialysis center advised her to f/u with [place] secondary to decreased thrill in the mid fistula and expanding small vein branches near the anastomosis.  She denise weakness, pain and numbness in the left hand.    Allergies  Allergen Reactions  . Lisinopril Shortness Of Breath, Swelling and Other (See Comments)    Throat irritation also. Patient takes losartan and tolerates fine.  [name] [Oxaprozin] Dermatitis and Other (See Comments)    hives    Current Outpatient Medications  Medication Sig Dispense Refill  . acetaminophen (TYLENOL) 325 MG tablet Take 650 mg by mouth every 6 (six) hours as needed for mild pain, moderate pain or headache.    [name] amLODipine (NORVASC) 10 MG tablet TAKE 1 TABLET BY MOUTH EVERY DAY 30 tablet 0  . AURYXIA 1 GM 210 MG(Fe) tablet     . cetirizine (ZYRTEC) 10 MG tablet Take 1 tablet (10 mg total) by mouth daily. 30 tablet 11  . clonazePAM (KLONOPIN) 0.5 MG tablet Take 1 tablet (0.5 mg total) by mouth at bedtime. 30 tablet 3  . cloNIDine (CATAPRES) 0.1 MG tablet TAKE 1 TABLET BY MOUTH TWICE A DAY 60 tablet 0  . hydrALAZINE (APRESOLINE) 50 MG tablet Take 1 tablet (50 mg total) by mouth 3 (three) times daily. 90 tablet 1  . isosorbide dinitrate (ISORDIL) 20 MG tablet TAKE 1 TABLET BY MOUTH THREE TIMES A DAY 90 tablet 0  . labetalol (NORMODYNE) 200 MG tablet TAKE 1 TABLET BY MOUTH THREE TIMES A DAY 90 tablet 0  . multivitamin (RENA-VIT) TABS tablet Take 1 tablet by mouth daily.  11  . omeprazole (PRILOSEC) 40 MG capsule Take 1 capsule (40 mg total) by mouth daily. 30 capsule 3  . PROAIR HFA 108 (90 Base) MCG/ACT inhaler INHALE 2 PUFFS BY MOUTH EVERY 4 HOURS AS NEEDED FOR WHEEZING/SHORTNESS OF BREATH  0  . QUEtiapine (SEROQUEL) 100 MG tablet TAKE 1 TABLET BY MOUTH THREE TIMES A DAY  90 tablet 0  . traZODone (DESYREL) 50 MG tablet Take 0.5-1 tablets (25-50 mg total) by mouth at bedtime as needed for sleep. 30 tablet 3   No current facility-administered medications for this visit.      ROS:  See HPI  Physical Exam:  Vitals:   05/28/17 1547  BP: 138/83  Pulse: 80  Resp: 14  Temp: 99.6 F (37.6 C)  SpO2: 97%    Incision:  Well healed antecubital incision. Extremities:  Palpable radial pulse, sensation intact and grip 5/5.  Palpable anastomosis thrill, pulsitile thrill and then very weak mid fistula.  Doppler signal revealed significant decrease in the velocity of the thrill.  Heart RRR Abdomen:  Soft, NTTP Lungs non labored breathing  Assessment/Plan:  This is a 62 y.o. female who is s/p: Left brachial cephalic fistula creation with probable mid cephal;ic stenosis preventing the fistula from maturing properly.  We will schedule her for a fistulogram with possible intervention with Dr. Bridgett Larsson Thursday 06/04/2017.  She has HD M-W-F.     Roxy Horseman PA-C [facility] [phone]  Clinic MD:  Armine Rizzolo  History and exam findings as above.  Poorly maturing left arm AV fistula.  We will schedule her for a fistulogram with Dr. Bridgett Larsson on the next 30  to avoid her dialysis day.  Hopefully an intervention will assist with maturing the fistula.  She may require surgical revision.  She may require a new access.  All this was discussed with the patient today.  Risk benefits possible complications and procedure details including but not limited to bleeding infection injury of the fistula were discussed with patient today.  She understands and agrees to proceed.  Ruta Hinds, MD [facility] [place] Office: [phone] Pager: [phone]

## 2017-05-28 NOTE — H&P (View-Only) (Signed)
POST OPERATIVE OFFICE NOTE    CC:  F/u for surgery  HPI:  This is a 62 y.o. female who is s/p Left brachial cephalic fistula creation 04/29/2017.  She is currently on HD vis right TDC.  Her dialysis center advised her to f/u with [place] secondary to decreased thrill in the mid fistula and expanding small vein branches near the anastomosis.  She denise weakness, pain and numbness in the left hand.    Allergies  Allergen Reactions  . Lisinopril Shortness Of Breath, Swelling and Other (See Comments)    Throat irritation also. Patient takes losartan and tolerates fine.  [name] [Oxaprozin] Dermatitis and Other (See Comments)    hives    Current Outpatient Medications  Medication Sig Dispense Refill  . acetaminophen (TYLENOL) 325 MG tablet Take 650 mg by mouth every 6 (six) hours as needed for mild pain, moderate pain or headache.    [name] amLODipine (NORVASC) 10 MG tablet TAKE 1 TABLET BY MOUTH EVERY DAY 30 tablet 0  . AURYXIA 1 GM 210 MG(Fe) tablet     . cetirizine (ZYRTEC) 10 MG tablet Take 1 tablet (10 mg total) by mouth daily. 30 tablet 11  . clonazePAM (KLONOPIN) 0.5 MG tablet Take 1 tablet (0.5 mg total) by mouth at bedtime. 30 tablet 3  . cloNIDine (CATAPRES) 0.1 MG tablet TAKE 1 TABLET BY MOUTH TWICE A DAY 60 tablet 0  . hydrALAZINE (APRESOLINE) 50 MG tablet Take 1 tablet (50 mg total) by mouth 3 (three) times daily. 90 tablet 1  . isosorbide dinitrate (ISORDIL) 20 MG tablet TAKE 1 TABLET BY MOUTH THREE TIMES A DAY 90 tablet 0  . labetalol (NORMODYNE) 200 MG tablet TAKE 1 TABLET BY MOUTH THREE TIMES A DAY 90 tablet 0  . multivitamin (RENA-VIT) TABS tablet Take 1 tablet by mouth daily.  11  . omeprazole (PRILOSEC) 40 MG capsule Take 1 capsule (40 mg total) by mouth daily. 30 capsule 3  . PROAIR HFA 108 (90 Base) MCG/ACT inhaler INHALE 2 PUFFS BY MOUTH EVERY 4 HOURS AS NEEDED FOR WHEEZING/SHORTNESS OF BREATH  0  . QUEtiapine (SEROQUEL) 100 MG tablet TAKE 1 TABLET BY MOUTH THREE TIMES A DAY  90 tablet 0  . traZODone (DESYREL) 50 MG tablet Take 0.5-1 tablets (25-50 mg total) by mouth at bedtime as needed for sleep. 30 tablet 3   No current facility-administered medications for this visit.      ROS:  See HPI  Physical Exam:  Vitals:   05/28/17 1547  BP: 138/83  Pulse: 80  Resp: 14  Temp: 99.6 F (37.6 C)  SpO2: 97%    Incision:  Well healed antecubital incision. Extremities:  Palpable radial pulse, sensation intact and grip 5/5.  Palpable anastomosis thrill, pulsitile thrill and then very weak mid fistula.  Doppler signal revealed significant decrease in the velocity of the thrill.  Heart RRR Abdomen:  Soft, NTTP Lungs non labored breathing  Assessment/Plan:  This is a 62 y.o. female who is s/p: Left brachial cephalic fistula creation with probable mid cephal;ic stenosis preventing the fistula from maturing properly.  We will schedule her for a fistulogram with possible intervention with Dr. Bridgett Larsson Thursday 06/04/2017.  She has HD M-W-F.     Roxy Horseman PA-C [facility] [phone]  Clinic MD:  Klarisa Barman  History and exam findings as above.  Poorly maturing left arm AV fistula.  We will schedule her for a fistulogram with Dr. Bridgett Larsson on the next 30  to avoid her dialysis day.  Hopefully an intervention will assist with maturing the fistula.  She may require surgical revision.  She may require a new access.  All this was discussed with the patient today.  Risk benefits possible complications and procedure details including but not limited to bleeding infection injury of the fistula were discussed with patient today.  She understands and agrees to proceed.  Ruta Hinds, MD [facility] [place] Office: [phone] Pager: [phone]

## 2017-05-29 ENCOUNTER — Encounter (HOSPITAL_COMMUNITY): Payer: Self-pay

## 2017-05-29 DIAGNOSIS — D509 Iron deficiency anemia, unspecified: Secondary | ICD-10-CM | POA: Diagnosis not present

## 2017-05-29 DIAGNOSIS — N2581 Secondary hyperparathyroidism of renal origin: Secondary | ICD-10-CM | POA: Diagnosis not present

## 2017-05-29 DIAGNOSIS — Z23 Encounter for immunization: Secondary | ICD-10-CM | POA: Diagnosis not present

## 2017-05-29 DIAGNOSIS — N186 End stage renal disease: Secondary | ICD-10-CM | POA: Diagnosis not present

## 2017-06-01 DIAGNOSIS — D509 Iron deficiency anemia, unspecified: Secondary | ICD-10-CM | POA: Diagnosis not present

## 2017-06-01 DIAGNOSIS — N186 End stage renal disease: Secondary | ICD-10-CM | POA: Diagnosis not present

## 2017-06-01 DIAGNOSIS — N2581 Secondary hyperparathyroidism of renal origin: Secondary | ICD-10-CM | POA: Diagnosis not present

## 2017-06-01 DIAGNOSIS — I82401 Acute embolism and thrombosis of unspecified deep veins of right lower extremity: Secondary | ICD-10-CM | POA: Diagnosis not present

## 2017-06-01 DIAGNOSIS — Z23 Encounter for immunization: Secondary | ICD-10-CM | POA: Diagnosis not present

## 2017-06-03 ENCOUNTER — Other Ambulatory Visit: Payer: Self-pay | Admitting: Family Medicine

## 2017-06-03 DIAGNOSIS — N186 End stage renal disease: Secondary | ICD-10-CM | POA: Diagnosis not present

## 2017-06-03 DIAGNOSIS — N2581 Secondary hyperparathyroidism of renal origin: Secondary | ICD-10-CM | POA: Diagnosis not present

## 2017-06-03 DIAGNOSIS — Z23 Encounter for immunization: Secondary | ICD-10-CM | POA: Diagnosis not present

## 2017-06-03 DIAGNOSIS — D509 Iron deficiency anemia, unspecified: Secondary | ICD-10-CM | POA: Diagnosis not present

## 2017-06-04 ENCOUNTER — Other Ambulatory Visit: Payer: Self-pay | Admitting: *Deleted

## 2017-06-04 ENCOUNTER — Encounter (HOSPITAL_COMMUNITY): Payer: Self-pay | Admitting: Vascular Surgery

## 2017-06-04 ENCOUNTER — Ambulatory Visit (HOSPITAL_COMMUNITY)
Admission: RE | Disposition: A | Discharge status: Home / Self Care | Payer: Self-pay | Source: Ambulatory Visit | Attending: Vascular Surgery

## 2017-06-04 ENCOUNTER — Ambulatory Visit (HOSPITAL_COMMUNITY)
Admission: RE | Admit: 2017-06-04 | Discharge: 2017-06-04 | Disposition: A | Discharge status: Home / Self Care | Payer: Medicare Other | Source: Ambulatory Visit | Attending: Vascular Surgery | Admitting: Vascular Surgery

## 2017-06-04 ENCOUNTER — Encounter: Payer: Self-pay | Admitting: Vascular Surgery

## 2017-06-04 ENCOUNTER — Other Ambulatory Visit: Payer: Self-pay | Admitting: General Practice

## 2017-06-04 DIAGNOSIS — Z79899 Other long term (current) drug therapy: Secondary | ICD-10-CM | POA: Insufficient documentation

## 2017-06-04 DIAGNOSIS — Z888 Allergy status to other drugs, medicaments and biological substances status: Secondary | ICD-10-CM | POA: Insufficient documentation

## 2017-06-04 DIAGNOSIS — Y832 Surgical operation with anastomosis, bypass or graft as the cause of abnormal reaction of the patient, or of later complication, without mention of misadventure at the time of the procedure: Secondary | ICD-10-CM | POA: Diagnosis not present

## 2017-06-04 DIAGNOSIS — N186 End stage renal disease: Secondary | ICD-10-CM | POA: Diagnosis not present

## 2017-06-04 DIAGNOSIS — Z992 Dependence on renal dialysis: Secondary | ICD-10-CM | POA: Diagnosis not present

## 2017-06-04 DIAGNOSIS — T82898A Other specified complication of vascular prosthetic devices, implants and grafts, initial encounter: Secondary | ICD-10-CM | POA: Diagnosis not present

## 2017-06-04 DIAGNOSIS — T82868A Thrombosis of vascular prosthetic devices, implants and grafts, initial encounter: Secondary | ICD-10-CM | POA: Diagnosis not present

## 2017-06-04 HISTORY — PX: A/V FISTULAGRAM: CATH118298

## 2017-06-04 LAB — POCT I-STAT, CHEM 8
BUN: 34 mg/dL — AB (ref 6–20)
CALCIUM ION: 1.09 mmol/L — AB (ref 1.15–1.40)
CHLORIDE: 95 mmol/L — AB (ref 101–111)
Creatinine, Ser: 5.7 mg/dL — ABNORMAL HIGH (ref 0.44–1.00)
Glucose, Bld: 127 mg/dL — ABNORMAL HIGH (ref 65–99)
HEMATOCRIT: 41 % (ref 36.0–46.0)
Hemoglobin: 13.9 g/dL (ref 12.0–15.0)
Potassium: 3.2 mmol/L — ABNORMAL LOW (ref 3.5–5.1)
SODIUM: 140 mmol/L (ref 135–145)
TCO2: 32 mmol/L (ref 22–32)

## 2017-06-04 SURGERY — A/V FISTULAGRAM
Anesthesia: LOCAL | Laterality: Left

## 2017-06-04 MED ORDER — LABETALOL HCL 5 MG/ML IV SOLN: 10.0000 mg | INTRAVENOUS | Status: DC | PRN

## 2017-06-04 MED ORDER — LIDOCAINE HCL (PF) 1 % IJ SOLN
INTRAMUSCULAR | Status: DC | PRN
  Administered 2017-06-04: 2 mL via INTRADERMAL

## 2017-06-04 MED ORDER — SODIUM CHLORIDE 0.9% FLUSH: 3.0000 mL | Freq: Two times a day (BID) | INTRAVENOUS | Status: DC

## 2017-06-04 MED ORDER — SODIUM CHLORIDE 0.9% FLUSH: 3.0000 mL | INTRAVENOUS | Status: DC | PRN

## 2017-06-04 MED ORDER — HEPARIN (PORCINE) IN NACL 2-0.9 UNIT/ML-% IJ SOLN
INTRAMUSCULAR | Status: AC | PRN
  Administered 2017-06-04: 500 mL

## 2017-06-04 MED ORDER — AMLODIPINE BESYLATE 10 MG PO TABS: 10.0000 mg | ORAL_TABLET | Freq: Every day | ORAL | 1 refills | Status: DC

## 2017-06-04 MED ORDER — ISOSORBIDE DINITRATE 20 MG PO TABS: 20.0000 mg | ORAL_TABLET | Freq: Three times a day (TID) | ORAL | 6 refills | Status: DC

## 2017-06-04 MED ORDER — LIDOCAINE HCL 1 % IJ SOLN
INTRAMUSCULAR | Status: AC
  Filled 2017-06-04: qty 20

## 2017-06-04 MED ORDER — HYDRALAZINE HCL 20 MG/ML IJ SOLN: 5.0000 mg | INTRAMUSCULAR | Status: DC | PRN

## 2017-06-04 MED ORDER — SODIUM CHLORIDE 0.9 % IV SOLN: 250.0000 mL | INTRAVENOUS | Status: DC | PRN

## 2017-06-04 MED ORDER — HEPARIN (PORCINE) IN NACL 2-0.9 UNIT/ML-% IJ SOLN
INTRAMUSCULAR | Status: AC
  Filled 2017-06-04: qty 500

## 2017-06-04 MED ORDER — IODIXANOL 320 MG/ML IV SOLN
INTRAVENOUS | Status: DC | PRN
  Administered 2017-06-04: 5 mL

## 2017-06-04 SURGICAL SUPPLY — 10 items
COVER DOME SNAP 22 D (MISCELLANEOUS) ×2 IMPLANT
COVER PRB 48X5XTLSCP FOLD TPE (BAG) ×1 IMPLANT
COVER PROBE 5X48 (BAG) ×2
KIT MICROPUNCTURE NIT STIFF (SHEATH) ×1 IMPLANT
PROTECTION STATION PRESSURIZED (MISCELLANEOUS) ×2
STATION PROTECTION PRESSURIZED (MISCELLANEOUS) ×1 IMPLANT
STOPCOCK MORSE 400PSI 3WAY (MISCELLANEOUS) ×2 IMPLANT
TRAY PV CATH (CUSTOM PROCEDURE TRAY) ×2 IMPLANT
TUBING CIL FLEX 10 FLL-RA (TUBING) ×2 IMPLANT
WIRE TORQFLEX AUST .018X40CM (WIRE) ×2 IMPLANT

## 2017-06-04 NOTE — Op Note (Signed)
    OPERATIVE NOTE   PROCEDURE: 1.  left brachiocephalic arteriovenous fistula cannulation under ultrasound guidance 2.  left arm shuntogram  PRE-OPERATIVE DIAGNOSIS: left brachiocephalic arteriovenous fistula with poor flow rates   POST-OPERATIVE DIAGNOSIS: same as above   SURGEON: [name], MD  ANESTHESIA: local  ESTIMATED BLOOD LOSS: 50 cc  FINDING(S): 1. Visible thrombus in distal fistula 2. Occluded left brachiocephalic arteriovenous fistula: unable to traverse distal fistula to place sheath 3. Viable basilic vein for use for basilic vein transposition   SPECIMEN(S):  None  CONTRAST: 5 cc  INDICATIONS: [name] [name] is a 62 y.o. female who presents with left brachiocephalic arteriovenous fistula with poor flow rates.  The patient is scheduled for left arm shuntogram.  The patient is aware the risks include but are not limited to: bleeding, infection, thrombosis of the cannulated access, and possible anaphylactic reaction to the contrast.  The patient is aware of the risks of the procedure and elects to proceed forward.   DESCRIPTION: After full informed written consent was obtained, the patient was brought back to the angiography suite and placed supine upon the angiography table.  The patient was connected to monitoring equipment.  The left upper arm was prepped and draped in the standard fashion for a left arm shuntogram.  I followed the cephalic vein under Sonosite guidance down to its anastomosis to the brachial artery to verify this was the brachiocephalic arteriovenous fistula.  Under ultrasound guidance, the left brachiocephalic arteriovenous fistula was cannulated with a micropuncture needle.  The microwire was advanced 3-4 cm into the fistula.  The wire appeared to curl, so I removed the wire and did a hand injection.  This essentially demonstrated distal 1/3 occlusion of this fistula.  I removed the needle and held pressure for three minutes.  I cannulated more  proximal segments of this fistula two more times and everytime I encountered the same problem with the wire curling, consistent with obstruction.  I did not think I had enough wire into the fistula to place the microsheath.  I pulled the wire and needle and held pressure each time for 3 minutes.  Based on the images, this patient will need: new access.  A sterile bandage was applied to the puncture sites.  COMPLICATIONS: none  CONDITION: stable   [name], MD, [facility] [place] Office: [phone] Pager: [phone]  06/04/2017 11:22 AM

## 2017-06-04 NOTE — Discharge Instructions (Signed)

## 2017-06-04 NOTE — Interval H&P Note (Signed)
History and Physical Interval Note:  06/04/2017 9:34 AM  [name] [name]  has presented today for surgery, with the diagnosis of complication with fistula  The various methods of treatment have been discussed with the patient and family. After consideration of risks, benefits and other options for treatment, the patient has consented to  Procedure(s): A/V FISTULAGRAM (Left) as a surgical intervention .  The patient's history has been reviewed, patient examined, no change in status, stable for surgery.  I have reviewed the patient's chart and labs.  Questions were answered to the patient's satisfaction.     [name]

## 2017-06-05 ENCOUNTER — Telehealth: Payer: Self-pay | Admitting: *Deleted

## 2017-06-05 DIAGNOSIS — D509 Iron deficiency anemia, unspecified: Secondary | ICD-10-CM | POA: Diagnosis not present

## 2017-06-05 DIAGNOSIS — Z23 Encounter for immunization: Secondary | ICD-10-CM | POA: Diagnosis not present

## 2017-06-05 DIAGNOSIS — N2581 Secondary hyperparathyroidism of renal origin: Secondary | ICD-10-CM | POA: Diagnosis not present

## 2017-06-05 DIAGNOSIS — N186 End stage renal disease: Secondary | ICD-10-CM | POA: Diagnosis not present

## 2017-06-05 MED FILL — Lidocaine HCl Local Inj 1%: INTRAMUSCULAR | Qty: 20 | Status: AC

## 2017-06-05 NOTE — Telephone Encounter (Signed)
Phone call to patient instructed to be at [facility] department at 5:30 am on 06/16/17 for surgery. No food or drink past [place] night prior and must have driver for home. To expect call and follow the detailed instructions received from the pre-admission department about this surgery.

## 2017-06-08 ENCOUNTER — Encounter: Payer: Self-pay | Admitting: Family Medicine

## 2017-06-08 DIAGNOSIS — N186 End stage renal disease: Secondary | ICD-10-CM | POA: Diagnosis not present

## 2017-06-08 DIAGNOSIS — Z23 Encounter for immunization: Secondary | ICD-10-CM | POA: Diagnosis not present

## 2017-06-08 DIAGNOSIS — N2581 Secondary hyperparathyroidism of renal origin: Secondary | ICD-10-CM | POA: Diagnosis not present

## 2017-06-08 DIAGNOSIS — I82401 Acute embolism and thrombosis of unspecified deep veins of right lower extremity: Secondary | ICD-10-CM | POA: Diagnosis not present

## 2017-06-08 DIAGNOSIS — D509 Iron deficiency anemia, unspecified: Secondary | ICD-10-CM | POA: Diagnosis not present

## 2017-06-08 LAB — PROTIME-INR

## 2017-06-09 ENCOUNTER — Ambulatory Visit: Payer: Self-pay | Admitting: Family Medicine

## 2017-06-09 ENCOUNTER — Telehealth: Payer: Self-pay

## 2017-06-09 NOTE — Telephone Encounter (Signed)
Called representative at [facility]. RN unavailable so awaited callback. During this time I called and spoke with patient to assess symptoms (asymptomatic) but to review lab results with her. Discussed need for ER assessment or for her to have a POCT INR here in office ASAP. She refused stating she is fine and has appointment with Dialysis in the morning.   Finally spoke with RN at [facility]. Notes the MD (Dr. Joelyn Oms) was made aware and feels it is a false positive but also agreed patient should have ER evaluation today to be on the safe side. They are calling patient to encourage her again to go to the ER.   I will call her son Hendricks Milo to encourage him to take her as he is listed on the [facility]

## 2017-06-09 NOTE — Telephone Encounter (Signed)
PEC called and sent me Aniceto Boss from [facility] and she stated that patients INR was greater than 13.5 and her PT was greater than 100. I gave Einar Pheasant the results and Aniceto Boss is faxing over results also. Aniceto Boss did stated they are going to redraw.

## 2017-06-10 DIAGNOSIS — Z23 Encounter for immunization: Secondary | ICD-10-CM | POA: Diagnosis not present

## 2017-06-10 DIAGNOSIS — D689 Coagulation defect, unspecified: Secondary | ICD-10-CM | POA: Diagnosis not present

## 2017-06-10 DIAGNOSIS — N2581 Secondary hyperparathyroidism of renal origin: Secondary | ICD-10-CM | POA: Diagnosis not present

## 2017-06-10 DIAGNOSIS — N186 End stage renal disease: Secondary | ICD-10-CM | POA: Diagnosis not present

## 2017-06-10 DIAGNOSIS — D509 Iron deficiency anemia, unspecified: Secondary | ICD-10-CM | POA: Diagnosis not present

## 2017-06-11 ENCOUNTER — Encounter (HOSPITAL_COMMUNITY): Payer: Self-pay | Admitting: Emergency Medicine

## 2017-06-11 ENCOUNTER — Other Ambulatory Visit: Payer: Self-pay

## 2017-06-11 ENCOUNTER — Emergency Department (HOSPITAL_COMMUNITY): Payer: Medicare Other

## 2017-06-11 ENCOUNTER — Emergency Department (HOSPITAL_COMMUNITY)
Admission: EM | Admit: 2017-06-11 | Discharge: 2017-06-11 | Disposition: A | Discharge status: Home / Self Care | Payer: Medicare Other | Attending: Emergency Medicine | Admitting: Emergency Medicine

## 2017-06-11 ENCOUNTER — Encounter: Payer: Self-pay | Admitting: Family Medicine

## 2017-06-11 ENCOUNTER — Ambulatory Visit (INDEPENDENT_AMBULATORY_CARE_PROVIDER_SITE_OTHER): Payer: Medicare Other | Admitting: Family Medicine

## 2017-06-11 VITALS — BP 82/48 | HR 96 | Temp 97.8°F | Resp 14 | Ht 65.0 in | Wt 153.0 lb

## 2017-06-11 DIAGNOSIS — I130 Hypertensive heart and chronic kidney disease with heart failure and stage 1 through stage 4 chronic kidney disease, or unspecified chronic kidney disease: Secondary | ICD-10-CM | POA: Insufficient documentation

## 2017-06-11 DIAGNOSIS — E119 Type 2 diabetes mellitus without complications: Secondary | ICD-10-CM | POA: Insufficient documentation

## 2017-06-11 DIAGNOSIS — Z79899 Other long term (current) drug therapy: Secondary | ICD-10-CM | POA: Diagnosis not present

## 2017-06-11 DIAGNOSIS — R031 Nonspecific low blood-pressure reading: Secondary | ICD-10-CM | POA: Diagnosis not present

## 2017-06-11 DIAGNOSIS — I5032 Chronic diastolic (congestive) heart failure: Secondary | ICD-10-CM | POA: Insufficient documentation

## 2017-06-11 DIAGNOSIS — R791 Abnormal coagulation profile: Secondary | ICD-10-CM | POA: Diagnosis not present

## 2017-06-11 DIAGNOSIS — R29810 Facial weakness: Secondary | ICD-10-CM

## 2017-06-11 DIAGNOSIS — N189 Chronic kidney disease, unspecified: Secondary | ICD-10-CM | POA: Insufficient documentation

## 2017-06-11 DIAGNOSIS — I959 Hypotension, unspecified: Secondary | ICD-10-CM

## 2017-06-11 LAB — URINALYSIS, ROUTINE W REFLEX MICROSCOPIC
Glucose, UA: NEGATIVE mg/dL
KETONES UR: 15 mg/dL — AB
Nitrite: NEGATIVE
PROTEIN: 100 mg/dL — AB
Specific Gravity, Urine: 1.02 (ref 1.005–1.030)
pH: 7 (ref 5.0–8.0)

## 2017-06-11 LAB — COMPREHENSIVE METABOLIC PANEL
ALBUMIN: 3.5 g/dL (ref 3.5–5.0)
ALT: 27 U/L (ref 14–54)
AST: 50 U/L — ABNORMAL HIGH (ref 15–41)
Alkaline Phosphatase: 105 U/L (ref 38–126)
Anion gap: 13 (ref 5–15)
BUN: 32 mg/dL — AB (ref 6–20)
CHLORIDE: 99 mmol/L — AB (ref 101–111)
CO2: 25 mmol/L (ref 22–32)
Calcium: 9.1 mg/dL (ref 8.9–10.3)
Creatinine, Ser: 6.48 mg/dL — ABNORMAL HIGH (ref 0.44–1.00)
GFR calc Af Amer: 7 mL/min — ABNORMAL LOW (ref >60)
GFR calc non Af Amer: 6 mL/min — ABNORMAL LOW (ref >60)
GLUCOSE: 151 mg/dL — AB (ref 65–99)
POTASSIUM: 5.4 mmol/L — AB (ref 3.5–5.1)
Sodium: 137 mmol/L (ref 135–145)
Total Bilirubin: 1.6 mg/dL — ABNORMAL HIGH (ref 0.3–1.2)
Total Protein: 6.8 g/dL (ref 6.5–8.1)

## 2017-06-11 LAB — I-STAT TROPONIN, ED: Troponin i, poc: 0.01 ng/mL (ref 0.00–0.08)

## 2017-06-11 LAB — CBC
HCT: 37.8 % (ref 36.0–46.0)
HEMOGLOBIN: 12.5 g/dL (ref 12.0–15.0)
MCH: 31.7 pg (ref 26.0–34.0)
MCHC: 33.1 g/dL (ref 30.0–36.0)
MCV: 95.9 fL (ref 78.0–100.0)
Platelets: 199 10*3/uL (ref 150–400)
RBC: 3.94 MIL/uL (ref 3.87–5.11)
RDW: 14.8 % (ref 11.5–15.5)
WBC: 11.1 10*3/uL — AB (ref 4.0–10.5)

## 2017-06-11 LAB — RAPID URINE DRUG SCREEN, HOSP PERFORMED
Amphetamines: NOT DETECTED
BARBITURATES: NOT DETECTED
BENZODIAZEPINES: NOT DETECTED
Cocaine: NOT DETECTED
Opiates: NOT DETECTED
TETRAHYDROCANNABINOL: NOT DETECTED

## 2017-06-11 LAB — DIFFERENTIAL
BASOS PCT: 0 %
Basophils Absolute: 0 10*3/uL (ref 0.0–0.1)
EOS ABS: 0.5 10*3/uL (ref 0.0–0.7)
Eosinophils Relative: 5 %
Lymphocytes Relative: 29 %
Lymphs Abs: 3.3 10*3/uL (ref 0.7–4.0)
Monocytes Absolute: 1 10*3/uL (ref 0.1–1.0)
Monocytes Relative: 9 %
NEUTROS PCT: 57 %
Neutro Abs: 6.3 10*3/uL (ref 1.7–7.7)

## 2017-06-11 LAB — POCT INR: INR: 1

## 2017-06-11 LAB — I-STAT CHEM 8, ED
BUN: 45 mg/dL — ABNORMAL HIGH (ref 6–20)
Calcium, Ion: 0.98 mmol/L — ABNORMAL LOW (ref 1.15–1.40)
Chloride: 101 mmol/L (ref 101–111)
Creatinine, Ser: 6.6 mg/dL — ABNORMAL HIGH (ref 0.44–1.00)
GLUCOSE: 146 mg/dL — AB (ref 65–99)
HCT: 39 % (ref 36.0–46.0)
HEMOGLOBIN: 13.3 g/dL (ref 12.0–15.0)
POTASSIUM: 5.3 mmol/L — AB (ref 3.5–5.1)
SODIUM: 135 mmol/L (ref 135–145)
TCO2: 27 mmol/L (ref 22–32)

## 2017-06-11 LAB — URINALYSIS, MICROSCOPIC (REFLEX)

## 2017-06-11 LAB — PROTIME-INR
INR: 1.08
Prothrombin Time: 13.9 seconds (ref 11.4–15.2)

## 2017-06-11 LAB — APTT: aPTT: 29 seconds (ref 24–36)

## 2017-06-11 LAB — ETHANOL

## 2017-06-11 MED ORDER — SODIUM POLYSTYRENE SULFONATE 15 GM/60ML PO SUSP
30.0000 g | Freq: Once | ORAL | Status: AC
  Administered 2017-06-11: 30 g via ORAL
  Filled 2017-06-11: qty 120

## 2017-06-11 NOTE — ED Triage Notes (Signed)
Pt from PCP via EMS, for hypotension and left sided facial droop. Pt MWF dialysis, states she had full treatment yesterday. States facial droop has been going on for weeks and slight slurred speech. Endorses numbness and tingling. States daughter noted facial droop Monday but dialysis didn't notice. Pt AO x 4. 72/50 initially, 500CC 20 L FA, up to 97/62, NSR, 80HR, 99% room air. CBG 193/. Denies any med or diet changes. States she has history of clot in jugular.

## 2017-06-11 NOTE — ED Notes (Signed)
Patient transported to MRI 

## 2017-06-11 NOTE — ED Notes (Signed)
Phlebotomy to redraw PT/INR

## 2017-06-11 NOTE — ED Notes (Signed)
Patient transported to CT 

## 2017-06-11 NOTE — ED Notes (Signed)
Pt ambulatory to bathroom

## 2017-06-11 NOTE — Patient Instructions (Signed)
Going to ER via EMS

## 2017-06-11 NOTE — ED Provider Notes (Signed)
[facility] EMERGENCY DEPARTMENT Provider Note   CSN: [id number] Arrival date & time: 06/11/17  1331     History   Chief Complaint Chief Complaint  Patient presents with  . Facial Droop    stroke symptoms     HPI [name] [name] is a 62 y.o. female.  HPI patient with renal failure after renal transplant in 2009 back on dialysis for approximately 5 months presents with left-sided facial droop.  She states the symptom has been present for the past 1-2 weeks.  She initially thought her speech was different due to a chipped tooth in her mouth.  Her family noticed this morning that her left face was drooping and saw her primary care doctor who also agreed that they saw a difference in her face.  She denies any changes in vision or focal weakness or numbness of her arms and legs.  She denies any chest pain.  She had a full dialysis treatment yesterday.  She goes Monday Wednesday and Friday.  She has had no vomiting.  She has a history of carotid artery disease but does not have history of prior stroke.  There are no other associated systemic symptoms, there are no other alleviating or modifying factors.   Past Medical History:  Diagnosis Date  . Adenomatous colon polyp 04/1998  . Anemia   . Carotid artery disease ([place])    Carotid US 1/18: bilateral ICA 1-39, R thyroid lobe nodule (1.9x2.2x3cm); numerous L thyroid lobe nodules - repeat 1 year  . Chronic kidney disease    M/W/[facility]  . Chronic renal failure    post transplant  . Diabetes mellitus   . EBV infection    Per [facility]  . Encephalitis    NMDA per [facility]  . Esophagitis    Grade 1 Distal  . Focal seizure (H[facility])    History of d/t Encephalitis  . GERD (gastroesophageal reflux disease)   . Hemorrhoids   . History of subdural hematoma   . Hx of cardiovascular stress test    Lexiscan Myoview (06/2013):  No ischemia, EF 66%; normal.  //  Myoview 12/17: EF 62, no ischemia or scar; Normal  . Hx of echocardiogram     a. Echocardiogram (06/2013):  Mod focal basal hypertrophy, EF 60-65%, normal wall motion, Gr 1 DD, mild AI, mildly dilated ascending aorta (41 mm), mild LAE.; b.  Echo 9/16: mod LVH, EF 60-65%, no RWMA, Gr 1 DD, trivial AI, mild dilated ascending aorta, mild LAE  . Hyperkalemia   . Hyperlipidemia   . Hypertension   . Hypomagnesemia   . Left jugular vein thrombosis    Partial resolved per [facility]  . Metabolic acidosis   . Pneumonia   . Right jugular vein thrombosis    resolved from [facility]  . Seizures [facility])     Patient Active Problem List   Diagnosis Date Noted  . Watery eyes 05/07/2017  . Internal jugular vein thrombosis, bilateral ([place]) 03/26/2017  . 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 Chronic diastolic CHF (congestive heart failure) ([place]) 07/08/2016  . Anemia 07/08/2016  . Abnormal brain MRI 07/03/2016  . Fasciculation 06/18/2016  . Carotid artery disease ([place])   . Physical exam 01/28/2016  . Hyperlipidemia 06/28/2015  . Diabetes mellitus type II, controlled ([place]) 06/28/2015  . Grief 06/28/2015  . Thoracic ascending aortic aneurysm (41 mm on Echo 06/2013) 07/06/2013  . ESRD on dialysis ([place]) 06/16/2013  . History of renal transplant 06/16/2013  .  GERD (gastroesophageal reflux disease) 08/13/2009  . OTH&UNSPEC NONINFECTIOUS GASTROENTERITIS&COLITIS 08/13/2009  . Essential hypertension 04/25/2009  . HEMORRHOIDS-INTERNAL 04/25/2009  . WEIGHT LOSS 04/25/2009  . NAUSEA AND VOMITING 04/25/2009  . NAUSEA ALONE 04/25/2009  . PERSONAL HX COLONIC POLYPS 04/25/2009    Past Surgical History:  Procedure Laterality Date  . A/V FISTULAGRAM Left 06/04/2017   Procedure: A/V FISTULAGRAM;  Surgeon: Conrad , MD;  Location: [facility] CV LAB;  Service: Cardiovascular;  Laterality: Left;  . ABDOMINAL HYSTERECTOMY    . AV FISTULA PLACEMENT  07/04/2005   [place] AV fistula  . AV FISTULA PLACEMENT  08/27/2005  . AV FISTULA PLACEMENT Left 04/29/2017    Procedure: Creation of Left Arm ARTERIOVENOUS BRACHIOCEPHALIC FISTULA;  Surgeon: Elam Dutch, MD;  Location: [facility] OR;  Service: Vascular;  Laterality: Left;  . AV FISTULA PLACEMENT W/ PTFE  08/27/2005  . BRAIN BIOPSY    . CESAREAN SECTION    . DG AV DIALYSIS GRAFT DECLOT OR  07/24/2005   AV Gore-Tex graf  . DG AV DIALYSIS GRAFT DECLOT OR  Thrombosis right forearm, loop arteriovenous   Thrombosis right forearm, loop arteriovenous graft  . GASTROSTOMY TUBE PLACEMENT    . KIDNEY TRANSPLANT  2009   Both  . REMOVAL OF GASTROSTOMY TUBE    . THROMBECTOMY / ARTERIOVENOUS GRAFT REVISION  10/12/2006  . THROMBECTOMY / ARTERIOVENOUS GRAFT REVISION  10/16/2006    OB History   None      Home Medications    Prior to Admission medications   Medication Sig Start Date End Date Taking? Authorizing Provider  acetaminophen (TYLENOL) 650 MG CR tablet Take 650-1,300 mg by mouth every 8 (eight) hours as needed for pain.    Yes [provider]  amLODipine (NORVASC) 10 MG tablet Take 1 tablet (10 mg total) by mouth daily. 06/04/17  Yes Midge Minium, MD  cetirizine (ZYRTEC) 10 MG tablet Take 1 tablet (10 mg total) by mouth daily. 05/07/17  Yes Midge Minium, MD  clonazePAM (KLONOPIN) 0.5 MG tablet Take 1 tablet (0.5 mg total) by mouth at bedtime. 04/10/17  Yes Midge Minium, MD  cloNIDine (CATAPRES) 0.1 MG tablet TAKE 1 TABLET BY MOUTH TWICE A DAY Patient taking differently: Take 0.1 mg by mouth two times a day 05/25/17  Yes Midge Minium, MD  ferric citrate (AURYXIA) 1 GM 210 MG(Fe) tablet Take 210 mg by mouth See admin instructions. Take 210 mg by mouth three times a day with meals and 210 mg two times a day with snacks   Yes [provider]  isosorbide dinitrate (ISORDIL) 20 MG tablet Take 1 tablet (20 mg total) by mouth 3 (three) times daily. 06/04/17  Yes Midge Minium, MD  multivitamin (RENA-VIT) TABS tablet Take 1 tablet by mouth every morning.  03/20/17   Yes [provider]  omeprazole (PRILOSEC) 40 MG capsule Take 1 capsule (40 mg total) by mouth daily. 05/07/17  Yes Midge Minium, MD  PROAIR HFA 108 [phone] Base) MCG/ACT inhaler INHALE 2 PUFFS BY MOUTH EVERY 4 HOURS AS NEEDED FOR WHEEZING/SHORTNESS OF BREATH 02/18/17  Yes [provider]  QUEtiapine (SEROQUEL) 100 MG tablet TAKE 1 TABLET BY MOUTH THREE TIMES A DAY Patient taking differently: Take 100 mg by mouth three times a day 05/25/17  Yes Midge Minium, MD  traZODone (DESYREL) 50 MG tablet Take 0.5-1 tablets (25-50 mg total) by mouth at bedtime as needed for sleep. 05/07/17  Yes Midge Minium,  MD  hydrALAZINE (APRESOLINE) 50 MG tablet Take 1 tablet (50 mg total) by mouth 3 (three) times daily. Patient not taking: Reported on 06/11/2017 06/23/16   Roxan Hockey, MD    Family History Family History  Problem Relation Age of Onset  . Kidney disease Paternal Aunt   . Heart disease Mother   . Heart disease Father   . Colon cancer Neg Hx     Social History Social History   Tobacco Use  . Smoking status: Never Smoker  . Smokeless tobacco: Never Used  Substance Use Topics  . Alcohol use: No  . Drug use: No     Allergies   Lisinopril and Daypro [oxaprozin]   Review of Systems Review of Systems  ROS reviewed and all otherwise negative except for mentioned in HPI   Physical Exam Updated Vital Signs BP 124/74   Pulse 85   Resp 20   Ht 5\' 5"  (1.651 m)   Wt 69.4 kg (153 lb)   SpO2 96%   BMI 25.46 kg/m  Vitals reviewed Physical Exam  Physical Examination: General appearance - alert, well appearing, and in no distress Mental status - alert, oriented to person, place, and time Eyes -no conjunctival injection, no scleral icterus Mouth - mucous membranes moist, pharynx normal without lesions Neck - supple, no significant adenopathy Chest - clear to auscultation, no wheezes, rales or rhonchi, symmetric air entry Heart - normal rate, regular  rhythm, normal S1, S2, no murmurs, rubs, clicks or gallops Abdomen - soft, nontender, nondistended, no masses or organomegaly Neurological - alert, oriented x 3, normal speech, mild left sided facial droop,  Other cranial nerves without deficit, strength 5/5 in extremities x 4, sensation intact Extremities - peripheral pulses normal, no pedal edema, no clubbing or cyanosis Skin - normal coloration and turgor, no rashes   ED Treatments / Results  Labs (all labs ordered are listed, but only abnormal results are displayed) Labs Reviewed  CBC - Abnormal; Notable for the following components:      Result Value   WBC 11.1 (*)    All other components within normal limits  COMPREHENSIVE METABOLIC PANEL - Abnormal; Notable for the following components:   Potassium 5.4 (*)    Chloride 99 (*)    Glucose, Bld 151 (*)    BUN 32 (*)    Creatinine, Ser 6.48 (*)    AST 50 (*)    Total Bilirubin 1.6 (*)    GFR calc non Af Amer 6 (*)    GFR calc Af Amer 7 (*)    All other components within normal limits  URINALYSIS, ROUTINE W REFLEX MICROSCOPIC - Abnormal; Notable for the following components:   APPearance TURBID (*)    Hgb urine dipstick SMALL (*)    Bilirubin Urine SMALL (*)    Ketones, ur 15 (*)    Protein, ur 100 (*)    Leukocytes, UA LARGE (*)    All other components within normal limits  URINALYSIS, MICROSCOPIC (REFLEX) - Abnormal; Notable for the following components:   Bacteria, UA MANY (*)    Squamous Epithelial / LPF 6-30 (*)    All other components within normal limits  I-STAT CHEM 8, ED - Abnormal; Notable for the following components:   Potassium 5.3 (*)    BUN 45 (*)    Creatinine, Ser 6.60 (*)    Glucose, Bld 146 (*)    Calcium, Ion 0.98 (*)    All other components within normal limits  URINE  CULTURE  ETHANOL  DIFFERENTIAL  RAPID URINE DRUG SCREEN, HOSP PERFORMED  APTT  PROTIME-INR  I-STAT TROPONIN, ED    EKG  EKG Interpretation None      ED ECG REPORT    Date: 06/11/2017  Rate: 89  Rhythm: normal sinus rhythm  QRS Axis: left  Intervals: normal  ST/T Wave abnormalities: normal  Conduction Disutrbances:left anterior fascicular block  Narrative Interpretation:   Old EKG Reviewed: none available  Tracing not available in epic for interpretation in [facility] Ct Head Wo Contrast  Result Date: 06/11/2017 CLINICAL DATA:  Hypotension, LEFT side facial droop for weeks, end-stage renal disease on dialysis, history of prior brain biopsy, hypertension, diabetes mellitus, encephalitis, EBV infection, renal transplant EXAM: CT HEAD WITHOUT CONTRAST TECHNIQUE: Contiguous axial images were obtained from the base of the skull through the vertex without intravenous contrast. Sagittal and coronal MPR images reconstructed from axial data set. COMPARISON:  07/07/2016 FINDINGS: Brain: Minimal atrophy. Normal ventricular morphology. No midline shift or mass effect. Small vessel chronic ischemic changes of deep cerebral white matter. Small focus of encephalomalacia adjacent to a burr hole in the RIGHT parietal bone likely representing site of brain biopsy, new since 07/07/2016. No intracranial hemorrhage, mass lesion, evidence of acute infarction, or extra-axial fluid collection. Vascular: Minimal atherosclerotic calcification of internal carotid arteries at skull base. No hyperdense vessels. Skull: RIGHT parietal burr hole.  Otherwise intact. Sinuses/Orbits: Clear Other: N/A IMPRESSION: Interval old RIGHT parietal burr hole and adjacent focus of encephalomalacia in the RIGHT parietal lobe likely reflecting site of brain biopsy. Minimal atrophy and small vessel chronic ischemic changes of deep cerebral white matter. No acute intracranial abnormalities. Electronically Signed   By: Lavonia Dana M.D.   On: 06/11/2017 17:47   Mr Brain Wo Contrast  Result Date: 06/11/2017 CLINICAL DATA:  Left-sided facial droop.  History of brain biopsy. EXAM: MRI HEAD WITHOUT CONTRAST  TECHNIQUE: Multiplanar, multiecho pulse sequences of the brain and surrounding structures were obtained without intravenous contrast. COMPARISON:  Head CT 06/11/2017 FINDINGS: Brain: The midline structures are normal. There is no acute infarct or acute hemorrhage. No mass lesion, hydrocephalus, dural abnormality or extra-axial collection. Diffuse confluent hyperintense T2-weighted signal within the periventricular, deep and juxtacortical white matter. This has worsened slightly from the prior examination. No age-advanced or lobar predominant atrophy. No chronic microhemorrhage or superficial siderosis. Vascular: Major intracranial arterial and venous sinus flow voids are preserved. Skull and upper cervical spine: Remote right frontal burr hole. Sinuses/Orbits: No fluid levels or advanced mucosal thickening. No mastoid or middle ear effusion. Normal orbits. IMPRESSION: Chronic white matter disease has progressed slightly from the prior study. This is most notable in the right frontal lobe, which is confounded by the signal changes due to the prior brain biopsy at this location. Otherwise, no acute intracranial abnormality. Electronically Signed   By: Ulyses Jarred M.D.   On: 06/11/2017 19:54    Procedures Procedures (including critical care time)  Medications Ordered in ED Medications  sodium polystyrene (KAYEXALATE) 15 GM/60ML suspension 30 g (30 g Oral Given 06/11/17 1601)     Initial Impression / Assessment and Plan / ED Course  I have reviewed the triage vital signs and the nursing notes.  Pertinent labs & imaging results that were available during my care of the patient were reviewed by me and considered in my medical decision making (see chart for details).    5:59 PM  D/w Dr. Rory Percy, neurology, he recommends MR brain- if this shows stroke  she should be admitted, if not, then she can followup as an outpatient.    6:09 PM due to hx of anti-NMDA/encephalitis will need MR brain with and w/o  contrast.  If no acute findings Dr. Rory Percy states patient can followup at [facility].  Pt was not aware of this diagnosis- but I have spoken with her daughter on the phone who states she has an appointment next month with neuro at [facility].    MRI shows some increase in chronic disease- increased in right frontal lobe, no acute stroke,  Pt cleared for outpatient followup with neurology at [facility].      Final Clinical Impressions(s) / ED Diagnoses   Final diagnoses:  Facial droop    ED Discharge Orders    None       Pixie Casino, MD 06/11/17 2053

## 2017-06-11 NOTE — Progress Notes (Signed)
   Subjective:    Patient ID: [name] [name], female    DOB: [birth year], 62 y.o.   MRN: [id number]  HPI Hypotension- pt reports fatigue x1 week.  BP 2 weeks ago was 138/83 but today is 82/48.  Denies dizziness but 'i'm just sleepy'.  Some shortness of breath.    Elevated INR- we got a call from [facility] indicating that INR was 13.  Pt had bleeding from her gums this morning.  No rectal bleeding.   Review of Systems For ROS see HPI     Objective:   Physical Exam  Constitutional: She is oriented to person, place, and time. She appears well-developed and well-nourished.  Sitting in chair  HENT:  Head: Normocephalic and atraumatic.  Cardiovascular:  Tachy but regular  Pulmonary/Chest: Effort normal and breath sounds normal. No respiratory distress. She has no wheezes. She has no rales.  Neurological: She is alert and oriented to person, place, and time. A cranial nerve deficit is present.  Slow to respond New L sided mouth droop  Skin: Skin is warm and dry.  Vitals reviewed.         Assessment & Plan:  Hypotension- new.  Pt's BP was normal on 3/7 at 138/83.  Today is 82/48 on same BP meds.  Pt is tachy, fatigued, and intermittently SOB.  Given her symptoms and her complicated medical history, she needs to go to ER via EMS  Elevated INR- received phone call from Nephrology on Tuesday that INR >13.  At that time she was told to go to ER but refused.  Today she had bleeding from her gums.  Concern for possible acute CVA as she has new facial droop.  EMS arriving w/ lights and sirens.

## 2017-06-11 NOTE — Discharge Instructions (Signed)
Return to the ED with any concerns including difficulty breathing or swallowing, fainting, chest pain, vomiting and not able to keep down liquids, decreased level of alertness/lethargy, or any other alarming symptoms  Please be sure to call [facility] to arrange for a followup appointment with your neurologist--let them know that you were seen in the ED today.

## 2017-06-12 ENCOUNTER — Encounter (HOSPITAL_COMMUNITY): Payer: Self-pay | Admitting: *Deleted

## 2017-06-12 ENCOUNTER — Other Ambulatory Visit: Payer: Self-pay

## 2017-06-12 DIAGNOSIS — N2581 Secondary hyperparathyroidism of renal origin: Secondary | ICD-10-CM | POA: Diagnosis not present

## 2017-06-12 DIAGNOSIS — D509 Iron deficiency anemia, unspecified: Secondary | ICD-10-CM | POA: Diagnosis not present

## 2017-06-12 DIAGNOSIS — N186 End stage renal disease: Secondary | ICD-10-CM | POA: Diagnosis not present

## 2017-06-12 DIAGNOSIS — Z23 Encounter for immunization: Secondary | ICD-10-CM | POA: Diagnosis not present

## 2017-06-12 NOTE — Progress Notes (Signed)
Pt denies any acute cardiopulmonary issues. Pt under the care of Dr. Acie Fredrickson, Cardiology. Pt denies having a cardiac cath. Pt denies having a chest x ray within the last year.Pt made aware to check BG every 2 hours prior to arrival to hospital on DOS. Pt made aware to treat a BG < 70 with 4 ounces of apple or cranberry juice, wait 15 minutes after intervention to recheck BG, if BG remains < 70, call Short Stay unit to speak with a nurse. Pt verbalized understanding of all pre-op instructions. Anesthesia asked to review EKG and ED notes.

## 2017-06-14 DIAGNOSIS — F329 Major depressive disorder, single episode, unspecified: Secondary | ICD-10-CM | POA: Diagnosis not present

## 2017-06-14 DIAGNOSIS — Z931 Gastrostomy status: Secondary | ICD-10-CM

## 2017-06-14 DIAGNOSIS — Z87440 Personal history of urinary (tract) infections: Secondary | ICD-10-CM

## 2017-06-14 DIAGNOSIS — F419 Anxiety disorder, unspecified: Secondary | ICD-10-CM | POA: Diagnosis not present

## 2017-06-14 DIAGNOSIS — K219 Gastro-esophageal reflux disease without esophagitis: Secondary | ICD-10-CM | POA: Diagnosis not present

## 2017-06-14 DIAGNOSIS — Z7901 Long term (current) use of anticoagulants: Secondary | ICD-10-CM | POA: Diagnosis not present

## 2017-06-14 DIAGNOSIS — I712 Thoracic aortic aneurysm, without rupture: Secondary | ICD-10-CM | POA: Diagnosis not present

## 2017-06-14 DIAGNOSIS — G40109 Localization-related (focal) (partial) symptomatic epilepsy and epileptic syndromes with simple partial seizures, not intractable, without status epilepticus: Secondary | ICD-10-CM | POA: Diagnosis not present

## 2017-06-14 DIAGNOSIS — E1122 Type 2 diabetes mellitus with diabetic chronic kidney disease: Secondary | ICD-10-CM | POA: Diagnosis not present

## 2017-06-14 DIAGNOSIS — Z8701 Personal history of pneumonia (recurrent): Secondary | ICD-10-CM | POA: Diagnosis not present

## 2017-06-14 DIAGNOSIS — Z9181 History of falling: Secondary | ICD-10-CM

## 2017-06-14 DIAGNOSIS — Z992 Dependence on renal dialysis: Secondary | ICD-10-CM | POA: Diagnosis not present

## 2017-06-14 DIAGNOSIS — Z8673 Personal history of transient ischemic attack (TIA), and cerebral infarction without residual deficits: Secondary | ICD-10-CM

## 2017-06-14 DIAGNOSIS — I5032 Chronic diastolic (congestive) heart failure: Secondary | ICD-10-CM | POA: Diagnosis not present

## 2017-06-14 DIAGNOSIS — Z940 Kidney transplant status: Secondary | ICD-10-CM | POA: Diagnosis not present

## 2017-06-14 DIAGNOSIS — Z86718 Personal history of other venous thrombosis and embolism: Secondary | ICD-10-CM | POA: Diagnosis not present

## 2017-06-15 DIAGNOSIS — I82401 Acute embolism and thrombosis of unspecified deep veins of right lower extremity: Secondary | ICD-10-CM | POA: Diagnosis not present

## 2017-06-15 DIAGNOSIS — D509 Iron deficiency anemia, unspecified: Secondary | ICD-10-CM | POA: Diagnosis not present

## 2017-06-15 DIAGNOSIS — N186 End stage renal disease: Secondary | ICD-10-CM | POA: Diagnosis not present

## 2017-06-15 DIAGNOSIS — Z23 Encounter for immunization: Secondary | ICD-10-CM | POA: Diagnosis not present

## 2017-06-15 DIAGNOSIS — N2581 Secondary hyperparathyroidism of renal origin: Secondary | ICD-10-CM | POA: Diagnosis not present

## 2017-06-16 ENCOUNTER — Ambulatory Visit (HOSPITAL_COMMUNITY)
Admission: RE | Admit: 2017-06-16 | Discharge: 2017-06-16 | Disposition: A | Discharge status: Home / Self Care | Payer: Medicare Other | Source: Ambulatory Visit | Attending: Vascular Surgery | Admitting: Vascular Surgery

## 2017-06-16 ENCOUNTER — Encounter (HOSPITAL_COMMUNITY)
Admission: RE | Disposition: A | Discharge status: Home / Self Care | Payer: Self-pay | Source: Ambulatory Visit | Attending: Vascular Surgery

## 2017-06-16 ENCOUNTER — Encounter (HOSPITAL_COMMUNITY): Payer: Self-pay | Admitting: *Deleted

## 2017-06-16 ENCOUNTER — Other Ambulatory Visit: Payer: Self-pay

## 2017-06-16 ENCOUNTER — Ambulatory Visit (HOSPITAL_COMMUNITY): Payer: Medicare Other | Admitting: Emergency Medicine

## 2017-06-16 DIAGNOSIS — Z992 Dependence on renal dialysis: Secondary | ICD-10-CM | POA: Insufficient documentation

## 2017-06-16 DIAGNOSIS — Z940 Kidney transplant status: Secondary | ICD-10-CM | POA: Insufficient documentation

## 2017-06-16 DIAGNOSIS — I120 Hypertensive chronic kidney disease with stage 5 chronic kidney disease or end stage renal disease: Secondary | ICD-10-CM | POA: Insufficient documentation

## 2017-06-16 DIAGNOSIS — I132 Hypertensive heart and chronic kidney disease with heart failure and with stage 5 chronic kidney disease, or end stage renal disease: Secondary | ICD-10-CM | POA: Diagnosis not present

## 2017-06-16 DIAGNOSIS — Z79899 Other long term (current) drug therapy: Secondary | ICD-10-CM | POA: Diagnosis not present

## 2017-06-16 DIAGNOSIS — N186 End stage renal disease: Secondary | ICD-10-CM

## 2017-06-16 DIAGNOSIS — E1122 Type 2 diabetes mellitus with diabetic chronic kidney disease: Secondary | ICD-10-CM | POA: Insufficient documentation

## 2017-06-16 DIAGNOSIS — Z888 Allergy status to other drugs, medicaments and biological substances status: Secondary | ICD-10-CM | POA: Diagnosis not present

## 2017-06-16 DIAGNOSIS — K219 Gastro-esophageal reflux disease without esophagitis: Secondary | ICD-10-CM | POA: Diagnosis not present

## 2017-06-16 DIAGNOSIS — I2510 Atherosclerotic heart disease of native coronary artery without angina pectoris: Secondary | ICD-10-CM | POA: Insufficient documentation

## 2017-06-16 DIAGNOSIS — Z86718 Personal history of other venous thrombosis and embolism: Secondary | ICD-10-CM | POA: Insufficient documentation

## 2017-06-16 DIAGNOSIS — E785 Hyperlipidemia, unspecified: Secondary | ICD-10-CM | POA: Insufficient documentation

## 2017-06-16 DIAGNOSIS — I5032 Chronic diastolic (congestive) heart failure: Secondary | ICD-10-CM | POA: Diagnosis not present

## 2017-06-16 HISTORY — PX: AV FISTULA PLACEMENT: SHX1204

## 2017-06-16 HISTORY — PX: BASCILIC VEIN TRANSPOSITION: SHX5742

## 2017-06-16 LAB — GLUCOSE, CAPILLARY
GLUCOSE-CAPILLARY: 136 mg/dL — AB (ref 65–99)
Glucose-Capillary: 125 mg/dL — ABNORMAL HIGH (ref 65–99)

## 2017-06-16 LAB — POCT I-STAT 4, (NA,K, GLUC, HGB,HCT)
Glucose, Bld: 142 mg/dL — ABNORMAL HIGH (ref 65–99)
HCT: 34 % — ABNORMAL LOW (ref 36.0–46.0)
HEMOGLOBIN: 11.6 g/dL — AB (ref 12.0–15.0)
POTASSIUM: 2.9 mmol/L — AB (ref 3.5–5.1)
Sodium: 140 mmol/L (ref 135–145)

## 2017-06-16 SURGERY — TRANSPOSITION, VEIN, BASILIC
Anesthesia: Monitor Anesthesia Care | Site: Arm Upper | Laterality: Left

## 2017-06-16 MED ORDER — 0.9 % SODIUM CHLORIDE (POUR BTL) OPTIME
TOPICAL | Status: DC | PRN
  Administered 2017-06-16: 1000 mL

## 2017-06-16 MED ORDER — PROPOFOL 10 MG/ML IV BOLUS: INTRAVENOUS | Status: DC | PRN

## 2017-06-16 MED ORDER — ONDANSETRON HCL 4 MG/2ML IJ SOLN
INTRAMUSCULAR | Status: DC | PRN
  Administered 2017-06-16: 4 mg via INTRAVENOUS

## 2017-06-16 MED ORDER — CEFAZOLIN SODIUM-DEXTROSE 2-4 GM/100ML-% IV SOLN
2.0000 g | INTRAVENOUS | Status: AC
  Administered 2017-06-16: 2 g via INTRAVENOUS

## 2017-06-16 MED ORDER — ONDANSETRON HCL 4 MG/2ML IJ SOLN: 4.0000 mg | Freq: Once | INTRAMUSCULAR | Status: DC | PRN

## 2017-06-16 MED ORDER — FENTANYL CITRATE (PF) 100 MCG/2ML IJ SOLN
INTRAMUSCULAR | Status: DC | PRN
  Administered 2017-06-16 (×2): 25 ug via INTRAVENOUS
  Administered 2017-06-16: 50 ug via INTRAVENOUS

## 2017-06-16 MED ORDER — HEPARIN SOD (PORK) LOCK FLUSH 100 UNIT/ML IV SOLN: 500.0000 [IU] | INTRAVENOUS | Status: DC | PRN

## 2017-06-16 MED ORDER — PROPOFOL 10 MG/ML IV BOLUS
INTRAVENOUS | Status: AC
  Filled 2017-06-16: qty 40

## 2017-06-16 MED ORDER — PROPOFOL 500 MG/50ML IV EMUL: INTRAVENOUS | Status: DC | PRN

## 2017-06-16 MED ORDER — HYDROMORPHONE HCL 1 MG/ML IJ SOLN: 0.2500 mg | INTRAMUSCULAR | Status: DC | PRN

## 2017-06-16 MED ORDER — MEPERIDINE HCL 50 MG/ML IJ SOLN: 6.2500 mg | INTRAMUSCULAR | Status: DC | PRN

## 2017-06-16 MED ORDER — PROPOFOL 10 MG/ML IV BOLUS
INTRAVENOUS | Status: DC | PRN
  Administered 2017-06-16: 30 mg via INTRAVENOUS

## 2017-06-16 MED ORDER — SODIUM CHLORIDE 0.9 % IV SOLN
INTRAVENOUS | Status: AC
  Filled 2017-06-16: qty 1.2

## 2017-06-16 MED ORDER — PROPOFOL 500 MG/50ML IV EMUL
INTRAVENOUS | Status: DC | PRN
  Administered 2017-06-16: 35 ug/kg/min via INTRAVENOUS

## 2017-06-16 MED ORDER — CHLORHEXIDINE GLUCONATE 4 % EX LIQD: 60.0000 mL | Freq: Once | CUTANEOUS | Status: DC

## 2017-06-16 MED ORDER — LIDOCAINE-EPINEPHRINE (PF) 1 %-1:200000 IJ SOLN
INTRAMUSCULAR | Status: AC
  Filled 2017-06-16: qty 30

## 2017-06-16 MED ORDER — HEPARIN SODIUM (PORCINE) 1000 UNIT/ML IJ SOLN
1.6000 mL | Freq: Once | INTRAMUSCULAR | Status: AC
  Administered 2017-06-16: 1600 [IU] via INTRAVENOUS
  Filled 2017-06-16: qty 1.6

## 2017-06-16 MED ORDER — SODIUM CHLORIDE 0.9 % IV SOLN
INTRAVENOUS | Status: DC
  Administered 2017-06-16: 07:00:00 via INTRAVENOUS

## 2017-06-16 MED ORDER — OXYCODONE-ACETAMINOPHEN 5-325 MG PO TABS: 1.0000 | ORAL_TABLET | Freq: Four times a day (QID) | ORAL | 0 refills | Status: DC | PRN

## 2017-06-16 MED ORDER — MIDAZOLAM HCL 2 MG/2ML IJ SOLN
INTRAMUSCULAR | Status: AC
  Filled 2017-06-16: qty 2

## 2017-06-16 MED ORDER — SODIUM CHLORIDE 0.9 % IV SOLN
INTRAVENOUS | Status: DC | PRN
  Administered 2017-06-16: 500 mL

## 2017-06-16 MED ORDER — LIDOCAINE-EPINEPHRINE (PF) 1 %-1:200000 IJ SOLN
INTRAMUSCULAR | Status: DC | PRN
  Administered 2017-06-16: 17 mL

## 2017-06-16 MED ORDER — MIDAZOLAM HCL 5 MG/5ML IJ SOLN
INTRAMUSCULAR | Status: DC | PRN
  Administered 2017-06-16: .5 mg via INTRAVENOUS
  Administered 2017-06-16: 1.5 mg via INTRAVENOUS

## 2017-06-16 MED ORDER — FENTANYL CITRATE (PF) 250 MCG/5ML IJ SOLN
INTRAMUSCULAR | Status: AC
  Filled 2017-06-16: qty 5

## 2017-06-16 SURGICAL SUPPLY — 35 items
ADH SKN CLS APL DERMABOND .7 (GAUZE/BANDAGES/DRESSINGS) ×2
ARMBAND PINK RESTRICT EXTREMIT (MISCELLANEOUS) ×3 IMPLANT
CANISTER SUCT 3000ML PPV (MISCELLANEOUS) ×3 IMPLANT
CLIP VESOCCLUDE MED 24/CT (CLIP) ×1 IMPLANT
CLIP VESOCCLUDE MED 6/CT (CLIP) IMPLANT
CLIP VESOCCLUDE SM WIDE 24/CT (CLIP) ×1 IMPLANT
CLIP VESOCCLUDE SM WIDE 6/CT (CLIP) IMPLANT
COVER PROBE W GEL 5X96 (DRAPES) ×3 IMPLANT
DERMABOND ADVANCED (GAUZE/BANDAGES/DRESSINGS) ×1
DERMABOND ADVANCED .7 DNX12 (GAUZE/BANDAGES/DRESSINGS) ×2 IMPLANT
ELECT REM PT RETURN 9FT ADLT (ELECTROSURGICAL) ×3
ELECTRODE REM PT RTRN 9FT ADLT (ELECTROSURGICAL) ×2 IMPLANT
GLOVE BIO SURGEON STRL SZ 6.5 (GLOVE) ×2 IMPLANT
GLOVE BIO SURGEON STRL SZ7.5 (GLOVE) ×3 IMPLANT
GLOVE BIOGEL PI IND STRL 7.0 (GLOVE) IMPLANT
GLOVE BIOGEL PI INDICATOR 7.0 (GLOVE) ×2
GLOVE SURG SS PI 6.5 STRL IVOR (GLOVE) ×1 IMPLANT
GLOVE SURG SS PI 7.0 STRL IVOR (GLOVE) ×1 IMPLANT
GOWN STRL REUS W/ TWL LRG LVL3 (GOWN DISPOSABLE) ×4 IMPLANT
GOWN STRL REUS W/ TWL XL LVL3 (GOWN DISPOSABLE) ×2 IMPLANT
GOWN STRL REUS W/TWL LRG LVL3 (GOWN DISPOSABLE) ×6
GOWN STRL REUS W/TWL XL LVL3 (GOWN DISPOSABLE) ×6
KIT BASIN OR (CUSTOM PROCEDURE TRAY) ×3 IMPLANT
KIT ROOM TURNOVER OR (KITS) ×3 IMPLANT
NS IRRIG 1000ML POUR BTL (IV SOLUTION) ×3 IMPLANT
PACK CV ACCESS (CUSTOM PROCEDURE TRAY) ×3 IMPLANT
PAD ARMBOARD 7.5X6 YLW CONV (MISCELLANEOUS) ×6 IMPLANT
SUT MNCRL AB 4-0 PS2 18 (SUTURE) ×4 IMPLANT
SUT PROLENE 6 0 BV (SUTURE) ×3 IMPLANT
SUT SILK 2 0 SH (SUTURE) IMPLANT
SUT VIC AB 3-0 SH 27 (SUTURE) ×3
SUT VIC AB 3-0 SH 27X BRD (SUTURE) ×2 IMPLANT
TOWEL GREEN STERILE (TOWEL DISPOSABLE) ×3 IMPLANT
UNDERPAD 30X30 (UNDERPADS AND DIAPERS) ×3 IMPLANT
WATER STERILE IRR 1000ML POUR (IV SOLUTION) ×3 IMPLANT

## 2017-06-16 NOTE — H&P (Signed)
HP    MRN #:  [id number]  History of Present Illness: This is a 62 y.o. female with esrd currently dialyzes via catheter. Has failed left arm bc avf. Recent fistulogram with patent left basilic vein avf.  Past Medical History:  Diagnosis Date  . Adenomatous colon polyp 04/1998  . Anemia   . Carotid artery disease ([place])    Carotid US 1/18: bilateral ICA 1-39, R thyroid lobe nodule (1.9x2.2x3cm); numerous L thyroid lobe nodules - repeat 1 year  . Chronic kidney disease    M/W/[facility]  . Chronic renal failure    post transplant  . Diabetes mellitus   . EBV infection    Per [facility]  . Encephalitis    NMDA per [facility]  . Esophagitis    Grade 1 Distal  . Focal seizure (HCC)    History of d/t Encephalitis  . GERD (gastroesophageal reflux disease)   . Hemorrhoids   . History of subdural hematoma   . Hx of cardiovascular stress test    Lexiscan Myoview (06/2013):  No ischemia, EF 66%; normal.  //  Myoview 12/17: EF 62, no ischemia or scar; Normal  . Hx of echocardiogram    a. Echocardiogram (06/2013):  Mod focal basal hypertrophy, EF 60-65%, normal wall motion, Gr 1 DD, mild AI, mildly dilated ascending aorta (41 mm), mild LAE.; b.  Echo 9/16: mod LVH, EF 60-65%, no RWMA, Gr 1 DD, trivial AI, mild dilated ascending aorta, mild LAE  . Hyperkalemia   . Hyperlipidemia   . Hypertension   . Hypomagnesemia   . Left jugular vein thrombosis    Partial resolved per [facility]  . Metabolic acidosis   . Pneumonia   . Right jugular vein thrombosis    resolved from [facility]  . Seizures ([place])     Past Surgical History:  Procedure Laterality Date  . A/V FISTULAGRAM Left 06/04/2017   Procedure: A/V FISTULAGRAM;  Surgeon: Conrad [place], MD;  Location: [place] [facility];  Service: Cardiovascular;  Laterality: Left;  . ABDOMINAL HYSTERECTOMY    . AV FISTULA PLACEMENT  07/04/2005   [place] AV fistula  . AV FISTULA PLACEMENT  08/27/2005  . AV FISTULA PLACEMENT Left 04/29/2017   Procedure: Creation of Left Arm  ARTERIOVENOUS BRACHIOCEPHALIC FISTULA;  Surgeon: Elam Dutch, MD;  Location[facility] OR;  Service: Vascular;  Laterality: Left;  . AV FISTULA PLACEMENT W/ PTFE  08/27/2005  . BRAIN BIOPSY    . CESAREAN SECTION    . DG AV DIALYSIS GRAFT DECLOT OR  07/24/2005   AV Gore-Tex graf  . DG AV DIALYSIS GRAFT DECLOT OR  Thrombosis right forearm, loop arteriovenous   Thrombosis right forearm, loop arteriovenous graft  . GASTROSTOMY TUBE PLACEMENT    . KIDNEY TRANSPLANT  2009   Both  . REMOVAL OF GASTROSTOMY TUBE    . THROMBECTOMY / ARTERIOVENOUS GRAFT REVISION  10/12/2006  . THROMBECTOMY / ARTERIOVENOUS GRAFT REVISION  10/16/2006    Allergies  Allergen Reactions  . Lisinopril Shortness Of Breath, Swelling and Other (See Comments)    Throat irritation also. Patient takes losartan and tolerates fine.  [name] [Oxaprozin] Hives and Dermatitis    Prior to Admission medications   Medication Sig Start Date End Date Taking? Authorizing Provider  acetaminophen (TYLENOL) 650 MG CR tablet Take 650-1,300 mg by mouth every 8 (eight) hours as needed for pain.    Yes [provider]  amLODipine (NORVASC) 10 MG tablet Take 1 tablet (10 mg  total) by mouth daily. 06/04/17  Yes Midge Minium, MD  cetirizine (ZYRTEC) 10 MG tablet Take 1 tablet (10 mg total) by mouth daily. 05/07/17  Yes Midge Minium, MD  clonazePAM (KLONOPIN) 0.5 MG tablet Take 1 tablet (0.5 mg total) by mouth at bedtime. 04/10/17  Yes Midge Minium, MD  cloNIDine (CATAPRES) 0.1 MG tablet TAKE 1 TABLET BY MOUTH TWICE A DAY Patient taking differently: Take 0.1 mg by mouth two times a day 05/25/17  Yes Midge Minium, MD  ferric citrate (AURYXIA) 1 GM 210 MG(Fe) tablet Take 210 mg by mouth See admin instructions. Take 210 mg by mouth three times a day with meals and 210 mg two times a day with snacks   Yes [provider]  isosorbide dinitrate (ISORDIL) 20 MG tablet Take 1 tablet (20 mg total) by mouth 3  (three) times daily. 06/04/17  Yes Midge Minium, MD  multivitamin (RENA-VIT) TABS tablet Take 1 tablet by mouth every morning.  03/20/17  Yes [provider]  omeprazole (PRILOSEC) 40 MG capsule Take 1 capsule (40 mg total) by mouth daily. 05/07/17  Yes Midge Minium, MD  traZODone (DESYREL) 50 MG tablet Take 0.5-1 tablets (25-50 mg total) by mouth at bedtime as needed for sleep. 05/07/17  Yes Midge Minium, MD  hydrALAZINE (APRESOLINE) 50 MG tablet Take 1 tablet (50 mg total) by mouth 3 (three) times daily. Patient not taking: Reported on 06/11/2017 06/23/16   Roxan Hockey, MD  PROAIR HFA 108 [phone] Base) MCG/ACT inhaler INHALE 2 PUFFS BY MOUTH EVERY 4 HOURS AS NEEDED FOR WHEEZING/SHORTNESS OF BREATH 02/18/17   [provider]  QUEtiapine (SEROQUEL) 100 MG tablet TAKE 1 TABLET BY MOUTH THREE TIMES A DAY Patient taking differently: Take 100 mg by mouth three times a day 05/25/17   Midge Minium, MD    Social History   Socioeconomic History  . Marital status: Widowed    Spouse name: Not on file  . Number of children: 2  . Years of education: 35  . Highest education level: Not on file  Occupational History  . Occupation: Investment banker, operational   Social Needs  . Financial resource strain: Not on file  . Food insecurity:    Worry: Not on file    Inability: Not on file  . Transportation needs:    Medical: Not on file    Non-medical: Not on file  Tobacco Use  . Smoking status: Never Smoker  . Smokeless tobacco: Never Used  Substance and Sexual Activity  . Alcohol use: No  . Drug use: No  . Sexual activity: Never  Lifestyle  . Physical activity:    Days per week: Not on file    Minutes per session: Not on file  . Stress: Not on file  Relationships  . Social connections:    Talks on phone: Not on file    Gets together: Not on file    Attends religious service: Not on file    Active member of club or organization: Not on file    Attends meetings of  clubs or organizations: Not on file    Relationship status: Not on file  . Intimate partner violence:    Fear of current or ex partner: Not on file    Emotionally abused: Not on file    Physically abused: Not on file    Forced sexual activity: Not on file  Other Topics Concern  . Not on file  Social  History Narrative   Denies caffeine use      Family History  Problem Relation Age of Onset  . Kidney disease Paternal Aunt   . Heart disease Mother   . Heart disease Father   . Colon cancer Neg Hx     Physical Examination  Vitals:   06/16/17 0635  BP: 110/82  Pulse: 76  Resp: 20  Temp: 98.5 F (36.9 C)  SpO2: 100%   Body mass index is 25.46 kg/m.  General:  WDWN in NAD HENT: WNL, normocephalic Pulmonary: normal non-labored breathing Cardiac: palpable left radial pulse Abdomen: soft, NT/ND, no masses Extremities: there is a thrill proximally in the left arm avf Neurologic: A&O X 3; Appropriate Affect ; SENSATION: normal; MOTOR FUNCTION:  moving all extremities equally. Speech is fluent/normal   CBC    Component Value Date/Time   WBC 11.1 (H) 06/11/2017 1343   RBC 3.94 06/11/2017 1343   HGB 11.6 (L) 06/16/2017 0650   HCT 34.0 (L) 06/16/2017 0650   PLT 199 06/11/2017 1343   MCV 95.9 06/11/2017 1343   MCH 31.7 06/11/2017 1343   MCHC 33.1 06/11/2017 1343   RDW 14.8 06/11/2017 1343   LYMPHSABS 3.3 06/11/2017 1343   MONOABS 1.0 06/11/2017 1343   EOSABS 0.5 06/11/2017 1343   BASOSABS 0.0 06/11/2017 1343    BMET    Component Value Date/Time   NA 140 06/16/2017 0650   K 2.9 (L) 06/16/2017 0650   CL 101 06/11/2017 1414   CO2 25 06/11/2017 1343   GLUCOSE 142 (H) 06/16/2017 0650   BUN 45 (H) 06/11/2017 1414   CREATININE 6.60 (H) 06/11/2017 1414   CREATININE 4.46 (H) 03/26/2017 1627   CALCIUM 9.1 06/11/2017 1343   GFRNONAA 6 (L) 06/11/2017 1343   GFRAA 7 (L) 06/11/2017 1343    COAGS: Lab Results  Component Value Date   INR 1.08 06/11/2017   INR 1.0  06/11/2017   INR 1.02 04/29/2017       ASSESSMENT/PLAN: This is a 62 y.o. female with failed left bc avf. Will plan for left 1st stage bvt vs graft today.  Ayasha Ellingsen C. Donzetta Matters, MD [facility] [place] Office: [phone] Pager: [phone]

## 2017-06-16 NOTE — Op Note (Signed)
    Patient name: [name] [name] MRN: [id number] DOB: 03-09-1956 Sex: female  06/16/2017 Pre-operative Diagnosis: esrd Post-operative diagnosis:  Same Surgeon:  Erlene Quan C. Donzetta Matters, MD Assistant: Leontine Locket, PA Procedure Performed: Left 1st stage basilic vein av fistula  Indications: 62 year old female with end-stage renal disease currently dialyzing via right IJ catheter.  She has a previously failed left cephalic vein fistula and on recent shuntogram demonstrated a suitable basilic vein for use.  She is now indicated for creation of left arm basilic vein fistula.  Findings: On ultrasound evaluation the basilic vein was patent and approximately 4 mm in diameter above the antecubitum.  At the level of the antecubitum it did branch and diameter was significantly reduced.  The brachial artery above the antecubitum was soft and external diameter was also 4 mm.  After transecting the vein and anastomosing end to side to the brachial artery there was a palpable radial pulse and a strong thrill in the runoff vein confirmed with Doppler.   Procedure:  The patient was identified in the holding area and taken to the operating room where she was placed supine on the operating table and MAC anesthesia was induced.  She was sterilely prepped and draped in the left arm given antibiotics and a timeout was called.  We used ultrasound to evaluate left upper arm with the above findings.  We then anesthetized the arm with 1% lidocaine with epinephrine to a total of approximately 20 cc.  A transverse incision was then made above the antecubitum we dissected down onto the vein and marked this for orientation.  We then dissected out the artery and placed a vessel loop around this.  The vein was then transected distally flushed with heparinized saline and dilated easily to 3 mm and was reclamped.  The artery was then clamped distally proximally opened longitudinally and flushed with heparinized saline both directions.   The vein was then spatulated and sewn end to side with 6-0 Prolene suture.  Prior to completing anastomosis we allowed flushing from all directions.  Upon completion there was strong thrill in the runoff vein palpable radial pulse both of these were confirmed with Doppler.  We irrigated the wound closed in 2 layers with Vicryl Monocryl.  Patient tolerated procedure well without immediate complication.  All counts were correct at completion.    EBL 20 cc.   Brandon C. Donzetta Matters, MD [facility] [place] Office: [phone] Pager: [phone]

## 2017-06-16 NOTE — Anesthesia Postprocedure Evaluation (Signed)
Anesthesia Post Note  Patient: [name] [name]  Procedure(s) Performed: BRACHIOCEPHALIC TRANSPOSITION  LEFT ARM (Left ) BRACHIO_BASCILIC ARTERIOVENOUS (AV) FISTULA CREATION (Left Arm Upper)     Patient location during evaluation: PACU Anesthesia Type: MAC Level of consciousness: awake and alert Pain management: pain level controlled Vital Signs Assessment: post-procedure vital signs reviewed and stable Respiratory status: spontaneous breathing, nonlabored ventilation, respiratory function stable and patient connected to nasal cannula oxygen Cardiovascular status: stable and blood pressure returned to baseline Postop Assessment: no apparent nausea or vomiting Anesthetic complications: no    Last Vitals:  Vitals:   06/16/17 0910 06/16/17 0940  BP: 106/76 129/80  Pulse: 70 83  Resp: (!) 21 20  Temp: (!) 36.2 C   SpO2:  96%    Last Pain:  Vitals:   06/16/17 0910  TempSrc:   PainSc: 0-No pain                 Travious Vanover DAVID

## 2017-06-16 NOTE — Transfer of Care (Signed)
Immediate Anesthesia Transfer of Care Note  Patient: [name] [name]  Procedure(s) Performed: BRACHIOCEPHALIC TRANSPOSITION  LEFT ARM (Left ) BRACHIO_BASCILIC ARTERIOVENOUS (AV) FISTULA CREATION (Left Arm Upper)  Patient Location: PACU  Anesthesia Type:MAC  Level of Consciousness: awake, alert , oriented and sedated  Airway & Oxygen Therapy: Patient Spontanous Breathing and Patient connected to nasal cannula oxygen  Post-op Assessment: Report given to RN, Post -op Vital signs reviewed and stable and Patient moving all extremities  Post vital signs: Reviewed and stable  Last Vitals:  Vitals Value Taken Time  BP    Temp    Pulse 82 06/16/2017  8:43 AM  Resp 20 06/16/2017  8:43 AM  SpO2 95 % 06/16/2017  8:43 AM  Vitals shown include unvalidated device data.  Last Pain:  Vitals:   06/16/17 0642  TempSrc:   PainSc: 0-No pain      Patients Stated Pain Goal: 3 (85/50/15 8682)  Complications: No apparent anesthesia complications

## 2017-06-16 NOTE — Anesthesia Preprocedure Evaluation (Signed)
Anesthesia Evaluation  Patient identified by MRN, date of birth, ID band Patient awake    Reviewed: Allergy & Precautions, NPO status , Patient's Chart, lab work & pertinent test results  Airway Mallampati: I  TM Distance: >3 FB Neck ROM: Full    Dental   Pulmonary    Pulmonary exam normal        Cardiovascular hypertension, Pt. on medications +CHF  Normal cardiovascular exam     Neuro/Psych Seizures -,     GI/Hepatic GERD  Medicated and Controlled,  Endo/Other  diabetes, Type 2, Oral Hypoglycemic Agents  Renal/GU ESRF and DialysisRenal disease     Musculoskeletal   Abdominal   Peds  Hematology   Anesthesia Other Findings   Reproductive/Obstetrics                             Anesthesia Physical Anesthesia Plan  ASA: III  Anesthesia Plan: MAC   Post-op Pain Management:    Induction: Intravenous  PONV Risk Score and Plan: 2 and Ondansetron and Treatment may vary due to age or medical condition  Airway Management Planned: Simple Face Mask  Additional Equipment:   Intra-op Plan:   Post-operative Plan:   Informed Consent: I have reviewed the patients History and Physical, chart, labs and discussed the procedure including the risks, benefits and alternatives for the proposed anesthesia with the patient or authorized representative who has indicated his/her understanding and acceptance.     Plan Discussed with: CRNA and Surgeon  Anesthesia Plan Comments:         Anesthesia Quick Evaluation

## 2017-06-16 NOTE — Discharge Instructions (Signed)
° °  [facility]  Discharge Instructions  AV Fistula or Graft Surgery for Dialysis Access  Please refer to the following instructions for your post-procedure care. Your surgeon or physician assistant will discuss any changes with you.  Activity  You may drive the day following your surgery, if you are comfortable and no longer taking prescription pain medication. Resume full activity as the soreness in your incision resolves.  Bathing/Showering  You may shower after you go home. Keep your incision dry for 48 hours. Do not soak in a bathtub, hot tub, or swim until the incision heals completely. You may not shower if you have a hemodialysis catheter.  Incision Care  Clean your incision with mild soap and water after 48 hours. Pat the area dry with a clean towel. You do not need a bandage unless otherwise instructed. Do not apply any ointments or creams to your incision. You may have skin glue on your incision. Do not peel it off. It will come off on its own in about one week. Your arm may swell a bit after surgery. To reduce swelling use pillows to elevate your arm so it is above your heart. Your doctor will tell you if you need to lightly wrap your arm with an ACE bandage.  Diet  Resume your normal diet. There are not special food restrictions following this procedure. In order to heal from your surgery, it is CRITICAL to get adequate nutrition. Your body requires vitamins, minerals, and protein. Vegetables are the best source of vitamins and minerals. Vegetables also provide the perfect balance of protein. Processed food has little nutritional value, so try to avoid this.  Medications  Resume taking all of your medications. If your incision is causing pain, you may take over-the counter pain relievers such as acetaminophen (Tylenol). If you were prescribed a stronger pain medication, please be aware these medications can cause nausea and constipation. Prevent  nausea by taking the medication with a snack or meal. Avoid constipation by drinking plenty of fluids and eating foods with high amount of fiber, such as fruits, vegetables, and grains.  Do not take Tylenol if you are taking prescription pain medications.  Follow up Your surgeon may want to see you in the office following your access surgery. If so, this will be arranged at the time of your surgery.  Please call us immediately for any of the following conditions:  Increased pain, redness, drainage (pus) from your incision site Fever of 101 degrees or higher Severe or worsening pain at your incision site Hand pain or numbness.  Reduce your risk of vascular disease:  Stop smoking. If you would like help, call [facility] at 1-800-QUIT-NOW (269) 292-8581) or [place] at [place] your cholesterol Maintain a desired weight Control your diabetes Keep your blood pressure down  Dialysis  It will take several weeks to several months for your new dialysis access to be ready for use. Your surgeon will determine when it is okay to use it. Your nephrologist will continue to direct your dialysis. You can continue to use your Permcath until your new access is ready for use.   06/16/2017 [name] [name] [id number] 10/01/1955  Surgeon(s): Waynetta Sandy, MD  Procedure(s): 1st stage basilic vein transposition  x Do not stick fistula for 12 weeks    If you have any questions, please call the office at [phone].

## 2017-06-17 ENCOUNTER — Encounter (HOSPITAL_COMMUNITY): Payer: Self-pay | Admitting: Vascular Surgery

## 2017-06-17 ENCOUNTER — Other Ambulatory Visit: Payer: Self-pay | Admitting: General Practice

## 2017-06-17 DIAGNOSIS — Z23 Encounter for immunization: Secondary | ICD-10-CM | POA: Diagnosis not present

## 2017-06-17 DIAGNOSIS — N186 End stage renal disease: Secondary | ICD-10-CM | POA: Diagnosis not present

## 2017-06-17 DIAGNOSIS — D509 Iron deficiency anemia, unspecified: Secondary | ICD-10-CM | POA: Diagnosis not present

## 2017-06-17 DIAGNOSIS — N2581 Secondary hyperparathyroidism of renal origin: Secondary | ICD-10-CM | POA: Diagnosis not present

## 2017-06-17 MED ORDER — CLONIDINE HCL 0.1 MG PO TABS: 0.1000 mg | ORAL_TABLET | Freq: Two times a day (BID) | ORAL | 0 refills | Status: DC

## 2017-06-18 ENCOUNTER — Telehealth: Payer: Self-pay | Admitting: Vascular Surgery

## 2017-06-18 ENCOUNTER — Telehealth: Payer: Self-pay | Admitting: Family Medicine

## 2017-06-18 NOTE — Telephone Encounter (Signed)
[facility] called and spoke to [name], [facility]. She was asked about the refill request of the medications the patient is requesting. She says "I can go ahead and fill the RenaVit. The other medications have already been picked up this month, so they will be filled again in April. I will need a new prescription for Seroquel." I called the patient's daughter [name] [name], left a detailed VM of the above and to call the pharmacy with details about when they can be filled, if needing specific dates.   Medication needed: Seroquel 100 mg 1 tab TID (this was discontinued at discharge from hospital)  Last OV: PCP: [id number] [name], [place] (Phone) [phone] (Fax)

## 2017-06-18 NOTE — Telephone Encounter (Signed)
Unable to leave pt vm. Mail box not set up. Mailed letter.

## 2017-06-18 NOTE — Telephone Encounter (Signed)
Copied from [place] [id number]. Topic: Quick Communication - Rx Refill/Question >> Jun 18, 2017  3:54 PM Scherrie Gerlach wrote: Medication: QUEtiapine (SEROQUEL) 100 MG tablet  TID                      multivitamin (RENA-VIT) TABS tablet                      clonazePAM (KLONOPIN) 0.5 MG tablet                      omeprazole (PRILOSEC) 40 MG capsule                       traZODone (DESYREL) 50 MG tablet                      cetirizine (ZYRTEC) 10 MG tablet                       isosorbide dinitrate (ISORDIL) 20 MG tablet    Has the patient contacted their pharmacy? Yes  but pt needs a 90 day of all these meds due to insurance.  [facility] [place], but no response. [id number] [name], [place] (Phone) [phone] (Fax)  [facility] states [facility] on her amLODipine (NORVASC) 10 MG tablet They do not know why.

## 2017-06-18 NOTE — Telephone Encounter (Signed)
-----   Message from Mena Goes, RN sent at 06/16/2017  5:28 PM EDT ----- Regarding: 6 weeks w/ duplex and Dr. Donzetta Matters to schedule second surgery   ----- Message ----- From: Gabriel Earing, PA-C Sent: 06/16/2017   8:44 AM To: [facility] Charge Pool  S/p 1st stage left BVT 06/16/17.  Will need to f/u with Dr. Donzetta Matters in 6 weeks with duplex as she will need surgery for the 2nd stage.  Thanks

## 2017-06-19 DIAGNOSIS — Z23 Encounter for immunization: Secondary | ICD-10-CM | POA: Diagnosis not present

## 2017-06-19 DIAGNOSIS — N2581 Secondary hyperparathyroidism of renal origin: Secondary | ICD-10-CM | POA: Diagnosis not present

## 2017-06-19 DIAGNOSIS — D509 Iron deficiency anemia, unspecified: Secondary | ICD-10-CM | POA: Diagnosis not present

## 2017-06-19 DIAGNOSIS — N186 End stage renal disease: Secondary | ICD-10-CM | POA: Diagnosis not present

## 2017-06-19 NOTE — Telephone Encounter (Signed)
Please advise if ok to fill the seroquel. Not sure if this was discontinued from Hospital stay?

## 2017-06-21 NOTE — Telephone Encounter (Signed)
Her H&P from 3/26 still lists Seroquel as an active medication and I do not see where it was stopped.  At this time, she needs to continue her Seroquel 100mg  TID and discuss with her neurologists at [facility] if this is something she should continue.

## 2017-06-22 DIAGNOSIS — Z992 Dependence on renal dialysis: Secondary | ICD-10-CM | POA: Diagnosis not present

## 2017-06-22 DIAGNOSIS — N2581 Secondary hyperparathyroidism of renal origin: Secondary | ICD-10-CM | POA: Diagnosis not present

## 2017-06-22 DIAGNOSIS — N186 End stage renal disease: Secondary | ICD-10-CM | POA: Diagnosis not present

## 2017-06-22 DIAGNOSIS — I5032 Chronic diastolic (congestive) heart failure: Secondary | ICD-10-CM | POA: Diagnosis not present

## 2017-06-22 DIAGNOSIS — I150 Renovascular hypertension: Secondary | ICD-10-CM | POA: Diagnosis not present

## 2017-06-22 DIAGNOSIS — I132 Hypertensive heart and chronic kidney disease with heart failure and with stage 5 chronic kidney disease, or end stage renal disease: Secondary | ICD-10-CM | POA: Diagnosis not present

## 2017-06-22 DIAGNOSIS — D631 Anemia in chronic kidney disease: Secondary | ICD-10-CM | POA: Diagnosis not present

## 2017-06-22 DIAGNOSIS — T82511D Breakdown (mechanical) of surgically created arteriovenous shunt, subsequent encounter: Secondary | ICD-10-CM | POA: Diagnosis not present

## 2017-06-22 DIAGNOSIS — E1122 Type 2 diabetes mellitus with diabetic chronic kidney disease: Secondary | ICD-10-CM | POA: Diagnosis not present

## 2017-06-22 DIAGNOSIS — D509 Iron deficiency anemia, unspecified: Secondary | ICD-10-CM | POA: Diagnosis not present

## 2017-06-22 DIAGNOSIS — I82401 Acute embolism and thrombosis of unspecified deep veins of right lower extremity: Secondary | ICD-10-CM | POA: Diagnosis not present

## 2017-06-22 MED ORDER — QUETIAPINE FUMARATE 100 MG PO TABS: 100.0000 mg | ORAL_TABLET | Freq: Three times a day (TID) | ORAL | 1 refills | Status: DC

## 2017-06-22 NOTE — Telephone Encounter (Signed)
Medication filled to pharmacy as requested.   

## 2017-06-23 DIAGNOSIS — I5032 Chronic diastolic (congestive) heart failure: Secondary | ICD-10-CM | POA: Diagnosis not present

## 2017-06-23 DIAGNOSIS — N186 End stage renal disease: Secondary | ICD-10-CM | POA: Diagnosis not present

## 2017-06-23 DIAGNOSIS — E1122 Type 2 diabetes mellitus with diabetic chronic kidney disease: Secondary | ICD-10-CM | POA: Diagnosis not present

## 2017-06-23 DIAGNOSIS — T82511D Breakdown (mechanical) of surgically created arteriovenous shunt, subsequent encounter: Secondary | ICD-10-CM | POA: Diagnosis not present

## 2017-06-23 DIAGNOSIS — I132 Hypertensive heart and chronic kidney disease with heart failure and with stage 5 chronic kidney disease, or end stage renal disease: Secondary | ICD-10-CM | POA: Diagnosis not present

## 2017-06-23 DIAGNOSIS — Z992 Dependence on renal dialysis: Secondary | ICD-10-CM | POA: Diagnosis not present

## 2017-06-24 DIAGNOSIS — N186 End stage renal disease: Secondary | ICD-10-CM | POA: Diagnosis not present

## 2017-06-24 DIAGNOSIS — D631 Anemia in chronic kidney disease: Secondary | ICD-10-CM | POA: Diagnosis not present

## 2017-06-24 DIAGNOSIS — N2581 Secondary hyperparathyroidism of renal origin: Secondary | ICD-10-CM | POA: Diagnosis not present

## 2017-06-24 DIAGNOSIS — D509 Iron deficiency anemia, unspecified: Secondary | ICD-10-CM | POA: Diagnosis not present

## 2017-06-25 ENCOUNTER — Other Ambulatory Visit: Payer: Self-pay

## 2017-06-25 ENCOUNTER — Other Ambulatory Visit: Payer: Self-pay | Admitting: Family Medicine

## 2017-06-25 ENCOUNTER — Encounter: Payer: Self-pay | Admitting: Family Medicine

## 2017-06-25 ENCOUNTER — Ambulatory Visit (INDEPENDENT_AMBULATORY_CARE_PROVIDER_SITE_OTHER): Payer: Medicare Other | Admitting: Family Medicine

## 2017-06-25 VITALS — BP 112/82 | HR 72 | Temp 98.7°F | Resp 16 | Ht 65.0 in | Wt 155.5 lb

## 2017-06-25 DIAGNOSIS — Z992 Dependence on renal dialysis: Secondary | ICD-10-CM | POA: Diagnosis not present

## 2017-06-25 DIAGNOSIS — I10 Essential (primary) hypertension: Secondary | ICD-10-CM | POA: Diagnosis not present

## 2017-06-25 DIAGNOSIS — E1122 Type 2 diabetes mellitus with diabetic chronic kidney disease: Secondary | ICD-10-CM

## 2017-06-25 DIAGNOSIS — N186 End stage renal disease: Secondary | ICD-10-CM | POA: Diagnosis not present

## 2017-06-25 LAB — CBC WITH DIFFERENTIAL/PLATELET
BASOS ABS: 0.1 10*3/uL (ref 0.0–0.1)
Basophils Relative: 0.8 % (ref 0.0–3.0)
Eosinophils Absolute: 0.4 10*3/uL (ref 0.0–0.7)
Eosinophils Relative: 4 % (ref 0.0–5.0)
HCT: 37.3 % (ref 36.0–46.0)
HEMOGLOBIN: 12.4 g/dL (ref 12.0–15.0)
LYMPHS ABS: 2.5 10*3/uL (ref 0.7–4.0)
Lymphocytes Relative: 23.6 % (ref 12.0–46.0)
MCHC: 33.3 g/dL (ref 30.0–36.0)
MCV: 96.5 fl (ref 78.0–100.0)
MONOS PCT: 11 % (ref 3.0–12.0)
Monocytes Absolute: 1.2 10*3/uL — ABNORMAL HIGH (ref 0.1–1.0)
NEUTROS PCT: 60.6 % (ref 43.0–77.0)
Neutro Abs: 6.5 10*3/uL (ref 1.4–7.7)
Platelets: 233 10*3/uL (ref 150.0–400.0)
RBC: 3.87 Mil/uL (ref 3.87–5.11)
RDW: 15.7 % — ABNORMAL HIGH (ref 11.5–15.5)
WBC: 10.8 10*3/uL — AB (ref 4.0–10.5)

## 2017-06-25 LAB — BASIC METABOLIC PANEL
BUN: 45 mg/dL — AB (ref 6–23)
CALCIUM: 9.4 mg/dL (ref 8.4–10.5)
CO2: 30 mEq/L (ref 19–32)
CREATININE: 6.24 mg/dL — AB (ref 0.40–1.20)
Chloride: 94 mEq/L — ABNORMAL LOW (ref 96–112)
GFR: 8.81 mL/min — AB (ref >60.00)
GLUCOSE: 178 mg/dL — AB (ref 70–99)
POTASSIUM: 3.9 meq/L (ref 3.5–5.1)
Sodium: 141 mEq/L (ref 135–145)

## 2017-06-25 LAB — LIPID PANEL
CHOLESTEROL: 253 mg/dL — AB (ref 0–200)
HDL: 52.9 mg/dL (ref >39.00)
LDL CALC: 170 mg/dL — AB (ref 0–99)
NonHDL: 199.78
TRIGLYCERIDES: 148 mg/dL (ref 0.0–149.0)
Total CHOL/HDL Ratio: 5
VLDL: 29.6 mg/dL (ref 0.0–40.0)

## 2017-06-25 LAB — TSH: TSH: 1.64 u[IU]/mL (ref 0.35–4.50)

## 2017-06-25 LAB — HEPATIC FUNCTION PANEL
ALBUMIN: 3.9 g/dL (ref 3.5–5.2)
ALK PHOS: 132 U/L — AB (ref 39–117)
ALT: 11 U/L (ref 0–35)
AST: 19 U/L (ref 0–37)
Bilirubin, Direct: 0.1 mg/dL (ref 0.0–0.3)
TOTAL PROTEIN: 7.3 g/dL (ref 6.0–8.3)
Total Bilirubin: 0.4 mg/dL (ref 0.2–1.2)

## 2017-06-25 LAB — HEMOGLOBIN A1C: HEMOGLOBIN A1C: 6.9 % — AB (ref 4.6–6.5)

## 2017-06-25 NOTE — Progress Notes (Signed)
   Subjective:    Patient ID: [name] [name], female    DOB: 1955/10/14, 62 y.o.   MRN: [id number]  HPI DM- currently diet controlled.  Due for foot exam, eye exam.  Last A1C 5.1%  Continues to have numbness in hands/feet.  Denies symptomatic lows. No sores or blisters on feet.  HTN- chronic problem.  Per pt, all BP meds were stopped at HD.  BP is much better today than last visit when she was hypotensive.  No CP, SOB, HAs, visual changes, edema.  HD- currently M/W/F at center on [address]  Has neurology appt upcoming, continues to see Dr Acie Fredrickson cardiology (due for appt)   Review of Systems For ROS see HPI     Objective:   Physical Exam  Constitutional: She is oriented to person, place, and time. She appears well-developed and well-nourished. No distress.  HENT:  Head: Normocephalic and atraumatic.  Eyes: Pupils are equal, round, and reactive to light. Conjunctivae and EOM are normal.  Neck: Normal range of motion. Neck supple. No thyromegaly present.  Cardiovascular: Normal rate, regular rhythm, normal heart sounds and intact distal pulses.  No murmur heard. Pulmonary/Chest: Effort normal and breath sounds normal. No respiratory distress.  Abdominal: Soft. She exhibits no distension. There is no tenderness.  Musculoskeletal: She exhibits no edema.  Lymphadenopathy:    She has no cervical adenopathy.  Neurological: She is alert and oriented to person, place, and time.  Skin: Skin is warm and dry.  Psychiatric: She has a normal mood and affect. Her behavior is normal.  Vitals reviewed.         Assessment & Plan:

## 2017-06-25 NOTE — Assessment & Plan Note (Signed)
Chronic problem.  Has HD M/W/F.

## 2017-06-25 NOTE — Assessment & Plan Note (Signed)
Currently diet controlled.  Some numbness in hands and feet from her recent encephalitis.  Due for eye exam.  Not an candidate for microalbumin due to HD.  Discussed need for low carb diet.  Check labs.  Adjust tx plan prn.

## 2017-06-25 NOTE — Patient Instructions (Signed)
Follow up in 3-4 months to recheck diabetes We'll notify you of your lab results and make any changes if needed I will reach out to dialysis and get an updated med list Please schedule an eye exam at your convenience Call with any questions or concerns [place]!!!

## 2017-06-25 NOTE — Assessment & Plan Note (Signed)
Chronic problem.  Due to hypotension, pt reports that HD stopped all BP meds.  I do not have notification of this so will ask Nephrology to keep me updated on changes so I can keep her chart and med list current.  BP is excellent today and pt is asymptomatic.  Will follow.

## 2017-06-26 DIAGNOSIS — D631 Anemia in chronic kidney disease: Secondary | ICD-10-CM | POA: Diagnosis not present

## 2017-06-26 DIAGNOSIS — D509 Iron deficiency anemia, unspecified: Secondary | ICD-10-CM | POA: Diagnosis not present

## 2017-06-26 DIAGNOSIS — N2581 Secondary hyperparathyroidism of renal origin: Secondary | ICD-10-CM | POA: Diagnosis not present

## 2017-06-26 DIAGNOSIS — N186 End stage renal disease: Secondary | ICD-10-CM | POA: Diagnosis not present

## 2017-06-30 ENCOUNTER — Other Ambulatory Visit: Payer: Self-pay | Admitting: General Practice

## 2017-06-30 DIAGNOSIS — N186 End stage renal disease: Secondary | ICD-10-CM | POA: Diagnosis not present

## 2017-06-30 DIAGNOSIS — D631 Anemia in chronic kidney disease: Secondary | ICD-10-CM | POA: Diagnosis not present

## 2017-06-30 DIAGNOSIS — E785 Hyperlipidemia, unspecified: Secondary | ICD-10-CM

## 2017-06-30 DIAGNOSIS — N2581 Secondary hyperparathyroidism of renal origin: Secondary | ICD-10-CM | POA: Diagnosis not present

## 2017-06-30 DIAGNOSIS — D509 Iron deficiency anemia, unspecified: Secondary | ICD-10-CM | POA: Diagnosis not present

## 2017-06-30 MED ORDER — ROSUVASTATIN CALCIUM 20 MG PO TABS: 20.0000 mg | ORAL_TABLET | Freq: Every evening | ORAL | 6 refills | Status: DC

## 2017-07-01 DIAGNOSIS — N186 End stage renal disease: Secondary | ICD-10-CM | POA: Diagnosis not present

## 2017-07-01 DIAGNOSIS — N2581 Secondary hyperparathyroidism of renal origin: Secondary | ICD-10-CM | POA: Diagnosis not present

## 2017-07-01 DIAGNOSIS — D509 Iron deficiency anemia, unspecified: Secondary | ICD-10-CM | POA: Diagnosis not present

## 2017-07-01 DIAGNOSIS — I82401 Acute embolism and thrombosis of unspecified deep veins of right lower extremity: Secondary | ICD-10-CM | POA: Diagnosis not present

## 2017-07-01 DIAGNOSIS — D631 Anemia in chronic kidney disease: Secondary | ICD-10-CM | POA: Diagnosis not present

## 2017-07-02 ENCOUNTER — Telehealth: Payer: Self-pay | Admitting: General Practice

## 2017-07-02 NOTE — Telephone Encounter (Signed)
Received a fax from pharmacy advising that per insurance only atorvastatin 40mg  is covered.

## 2017-07-02 NOTE — Telephone Encounter (Signed)
Ok to switch to Lipitor 40mg  daily

## 2017-07-03 DIAGNOSIS — D631 Anemia in chronic kidney disease: Secondary | ICD-10-CM | POA: Diagnosis not present

## 2017-07-03 DIAGNOSIS — N2581 Secondary hyperparathyroidism of renal origin: Secondary | ICD-10-CM | POA: Diagnosis not present

## 2017-07-03 DIAGNOSIS — N186 End stage renal disease: Secondary | ICD-10-CM | POA: Diagnosis not present

## 2017-07-03 DIAGNOSIS — D509 Iron deficiency anemia, unspecified: Secondary | ICD-10-CM | POA: Diagnosis not present

## 2017-07-03 MED ORDER — ATORVASTATIN CALCIUM 40 MG PO TABS: 40.0000 mg | ORAL_TABLET | Freq: Every day | ORAL | 1 refills | Status: DC

## 2017-07-03 NOTE — Telephone Encounter (Signed)
Pt made aware and new rx sent to pharmacy. Chart updated to reflect.

## 2017-07-06 DIAGNOSIS — D509 Iron deficiency anemia, unspecified: Secondary | ICD-10-CM | POA: Diagnosis not present

## 2017-07-06 DIAGNOSIS — I82401 Acute embolism and thrombosis of unspecified deep veins of right lower extremity: Secondary | ICD-10-CM | POA: Diagnosis not present

## 2017-07-06 DIAGNOSIS — N186 End stage renal disease: Secondary | ICD-10-CM | POA: Diagnosis not present

## 2017-07-06 DIAGNOSIS — N2581 Secondary hyperparathyroidism of renal origin: Secondary | ICD-10-CM | POA: Diagnosis not present

## 2017-07-06 DIAGNOSIS — D631 Anemia in chronic kidney disease: Secondary | ICD-10-CM | POA: Diagnosis not present

## 2017-07-08 ENCOUNTER — Other Ambulatory Visit: Payer: Self-pay

## 2017-07-08 DIAGNOSIS — N186 End stage renal disease: Secondary | ICD-10-CM | POA: Diagnosis not present

## 2017-07-08 DIAGNOSIS — D509 Iron deficiency anemia, unspecified: Secondary | ICD-10-CM | POA: Diagnosis not present

## 2017-07-08 DIAGNOSIS — N2581 Secondary hyperparathyroidism of renal origin: Secondary | ICD-10-CM | POA: Diagnosis not present

## 2017-07-08 DIAGNOSIS — D631 Anemia in chronic kidney disease: Secondary | ICD-10-CM | POA: Diagnosis not present

## 2017-07-08 DIAGNOSIS — Z48812 Encounter for surgical aftercare following surgery on the circulatory system: Secondary | ICD-10-CM

## 2017-07-08 DIAGNOSIS — Z992 Dependence on renal dialysis: Principal | ICD-10-CM

## 2017-07-10 DIAGNOSIS — D631 Anemia in chronic kidney disease: Secondary | ICD-10-CM | POA: Diagnosis not present

## 2017-07-10 DIAGNOSIS — N186 End stage renal disease: Secondary | ICD-10-CM | POA: Diagnosis not present

## 2017-07-10 DIAGNOSIS — D509 Iron deficiency anemia, unspecified: Secondary | ICD-10-CM | POA: Diagnosis not present

## 2017-07-10 DIAGNOSIS — N2581 Secondary hyperparathyroidism of renal origin: Secondary | ICD-10-CM | POA: Diagnosis not present

## 2017-07-13 DIAGNOSIS — I82401 Acute embolism and thrombosis of unspecified deep veins of right lower extremity: Secondary | ICD-10-CM | POA: Diagnosis not present

## 2017-07-13 DIAGNOSIS — N186 End stage renal disease: Secondary | ICD-10-CM | POA: Diagnosis not present

## 2017-07-13 DIAGNOSIS — D631 Anemia in chronic kidney disease: Secondary | ICD-10-CM | POA: Diagnosis not present

## 2017-07-13 DIAGNOSIS — N2581 Secondary hyperparathyroidism of renal origin: Secondary | ICD-10-CM | POA: Diagnosis not present

## 2017-07-13 DIAGNOSIS — D509 Iron deficiency anemia, unspecified: Secondary | ICD-10-CM | POA: Diagnosis not present

## 2017-07-15 ENCOUNTER — Other Ambulatory Visit: Payer: Self-pay | Admitting: Emergency Medicine

## 2017-07-15 DIAGNOSIS — N2581 Secondary hyperparathyroidism of renal origin: Secondary | ICD-10-CM | POA: Diagnosis not present

## 2017-07-15 DIAGNOSIS — N186 End stage renal disease: Secondary | ICD-10-CM | POA: Diagnosis not present

## 2017-07-15 DIAGNOSIS — D631 Anemia in chronic kidney disease: Secondary | ICD-10-CM | POA: Diagnosis not present

## 2017-07-15 DIAGNOSIS — D509 Iron deficiency anemia, unspecified: Secondary | ICD-10-CM | POA: Diagnosis not present

## 2017-07-15 DIAGNOSIS — E1129 Type 2 diabetes mellitus with other diabetic kidney complication: Secondary | ICD-10-CM | POA: Diagnosis not present

## 2017-07-15 MED ORDER — TRAZODONE HCL 50 MG PO TABS: 25.0000 mg | ORAL_TABLET | Freq: Every evening | ORAL | 1 refills | Status: DC | PRN

## 2017-07-17 DIAGNOSIS — N186 End stage renal disease: Secondary | ICD-10-CM | POA: Diagnosis not present

## 2017-07-17 DIAGNOSIS — D631 Anemia in chronic kidney disease: Secondary | ICD-10-CM | POA: Diagnosis not present

## 2017-07-17 DIAGNOSIS — N2581 Secondary hyperparathyroidism of renal origin: Secondary | ICD-10-CM | POA: Diagnosis not present

## 2017-07-17 DIAGNOSIS — D509 Iron deficiency anemia, unspecified: Secondary | ICD-10-CM | POA: Diagnosis not present

## 2017-07-20 DIAGNOSIS — N2581 Secondary hyperparathyroidism of renal origin: Secondary | ICD-10-CM | POA: Diagnosis not present

## 2017-07-20 DIAGNOSIS — N186 End stage renal disease: Secondary | ICD-10-CM | POA: Diagnosis not present

## 2017-07-20 DIAGNOSIS — D509 Iron deficiency anemia, unspecified: Secondary | ICD-10-CM | POA: Diagnosis not present

## 2017-07-20 DIAGNOSIS — I82401 Acute embolism and thrombosis of unspecified deep veins of right lower extremity: Secondary | ICD-10-CM | POA: Diagnosis not present

## 2017-07-20 DIAGNOSIS — D631 Anemia in chronic kidney disease: Secondary | ICD-10-CM | POA: Diagnosis not present

## 2017-07-21 ENCOUNTER — Ambulatory Visit: Payer: Self-pay

## 2017-07-21 NOTE — Telephone Encounter (Signed)
Please call pt and see if she needs to be seen sooner than Thursday.  Or if she needs to call the dialysis center and make them aware of her symptoms that started after HD

## 2017-07-21 NOTE — Telephone Encounter (Signed)
Pt.'s daughter reports increased weakness and "some shakiness" after dialysis yesterday. Still weal today. Offered an appointment for today - refuses. Request an appointment for Thursday. Appointment made. Instructed if she felt worse to call back - verbalizes understanding. Reason for Disposition . [1] MODERATE weakness (i.e., interferes with work, school, normal activities) AND [2] persists > 3 days  Answer Assessment - Initial Assessment Questions 1. DESCRIPTION: "Describe how you are feeling."     Weak and shaky 2. SEVERITY: "How bad is it?"  "Can you stand and walk?"   - MILD - Feels weak or tired, but does not interfere with work, school or normal activities   - [place] to stand and walk; weakness interferes with work, school, or normal activities   - SEVERE - Unable to stand or walk     Mild 3. ONSET:  "When did the weakness begin?"     Started yesterday after dialysis 4. CAUSE: "What do you think is causing the weakness?"     Unsure 5. MEDICINES: "Have you recently started a new medicine or had a change in the amount of a medicine?"     No 6. OTHER SYMPTOMS: "Do you have any other symptoms?" (e.g., chest pain, fever, cough, SOB, vomiting, diarrhea, bleeding)     No 7. PREGNANCY: "Is there any chance you are pregnant?" "When was your last menstrual period?"     No  Protocols used: WEAKNESS (GENERALIZED) AND FATIGUE-A-AH

## 2017-07-21 NOTE — Telephone Encounter (Signed)
Called and spoke with pt daughter. She is going to call and advise dialysis of moms symptoms and pt is keeping appt with [place] on Thursday.

## 2017-07-22 DIAGNOSIS — N186 End stage renal disease: Secondary | ICD-10-CM | POA: Diagnosis not present

## 2017-07-22 DIAGNOSIS — Z992 Dependence on renal dialysis: Secondary | ICD-10-CM | POA: Diagnosis not present

## 2017-07-22 DIAGNOSIS — I82401 Acute embolism and thrombosis of unspecified deep veins of right lower extremity: Secondary | ICD-10-CM | POA: Diagnosis not present

## 2017-07-22 DIAGNOSIS — I150 Renovascular hypertension: Secondary | ICD-10-CM | POA: Diagnosis not present

## 2017-07-22 DIAGNOSIS — N2581 Secondary hyperparathyroidism of renal origin: Secondary | ICD-10-CM | POA: Diagnosis not present

## 2017-07-23 ENCOUNTER — Ambulatory Visit (INDEPENDENT_AMBULATORY_CARE_PROVIDER_SITE_OTHER): Payer: Medicare Other | Admitting: Family Medicine

## 2017-07-23 ENCOUNTER — Other Ambulatory Visit: Payer: Self-pay

## 2017-07-23 ENCOUNTER — Encounter: Payer: Self-pay | Admitting: Family Medicine

## 2017-07-23 VITALS — BP 120/78 | HR 72 | Temp 98.9°F | Resp 16 | Ht 65.0 in | Wt 160.0 lb

## 2017-07-23 DIAGNOSIS — R319 Hematuria, unspecified: Secondary | ICD-10-CM | POA: Diagnosis not present

## 2017-07-23 DIAGNOSIS — R300 Dysuria: Secondary | ICD-10-CM | POA: Diagnosis not present

## 2017-07-23 DIAGNOSIS — R251 Tremor, unspecified: Secondary | ICD-10-CM

## 2017-07-23 DIAGNOSIS — R82998 Other abnormal findings in urine: Secondary | ICD-10-CM | POA: Diagnosis not present

## 2017-07-23 LAB — POCT URINALYSIS DIPSTICK
Bilirubin, UA: NEGATIVE
Glucose, UA: NEGATIVE
Ketones, UA: NEGATIVE
NITRITE UA: NEGATIVE
PH UA: 6 (ref 5.0–8.0)
Spec Grav, UA: 1.01 (ref 1.010–1.025)
UROBILINOGEN UA: 0.2 U/dL

## 2017-07-23 LAB — AMMONIA: AMMONIA: 39 umol/L — AB (ref 11–35)

## 2017-07-23 MED ORDER — CEPHALEXIN 250 MG PO CAPS: 250.0000 mg | ORAL_CAPSULE | Freq: Two times a day (BID) | ORAL | 0 refills | Status: AC

## 2017-07-23 NOTE — Progress Notes (Signed)
   Subjective:    Patient ID: [name] [name], female    DOB: November 02, 1955, 62 y.o.   MRN: [id number]  HPI Weakness- pt called nurse triage on 4/30 due to increased weakness and shakiness after HD on 4/29.  Still felt weak at time of call.  I encouraged pt to call HD and notify them of her sxs as they may need to adjust her dry weight.  Pt reports they adjusted her dry weight at HD yesterday and that she did feel better.  Pt reports if she is holding a plate of food, she will shake and feel as if she is going to drop it.  'it was bad on Monday'.  sxs have been going on for a 'week or 2'.  Since Monday, sxs are getting better.  Dysuria- pt reports burning w/ urination x10-14 days.  No fevers.  + frequency.   Review of Systems For ROS see HPI     Objective:   Physical Exam  Constitutional: She is oriented to person, place, and time. She appears well-developed and well-nourished. No distress.  HENT:  Head: Normocephalic and atraumatic.  Cardiovascular: Normal rate, regular rhythm, normal heart sounds and intact distal pulses.  Pulmonary/Chest: Effort normal and breath sounds normal. No respiratory distress. She has no wheezes.  Abdominal: Soft. There is no tenderness. There is no rebound and no guarding.  Neurological: She is alert and oriented to person, place, and time.  Faint coarse tremor of hands bilaterally- no asterixis  Skin: Skin is warm and dry.  Vitals reviewed.         Assessment & Plan:  Tremor both hands- pt has very mild tremor of hands bilaterally.  No asterixis but will check ammonia level.  Recent labs are WNL.  Suspect her intention tremor after HD is volume/electrolyte depletion as sxs have improved w/ change in dry weight.  Will continue to follow.  Dysuria- + dysuria and frequency.  UA is suspicious for infxn.  Start abx.  Send urine culture.

## 2017-07-23 NOTE — Patient Instructions (Signed)
Follow up in August to recheck diabetes We'll notify you of your lab results and make any changes if needed Start the Cephalexin as directed- twice daily, with your 1st dose after HD on HD days Let me know if the tremors don't improve Call with any questions or concerns Happy Mother's Day!!!

## 2017-07-24 ENCOUNTER — Telehealth: Payer: Self-pay | Admitting: General Practice

## 2017-07-24 DIAGNOSIS — N2581 Secondary hyperparathyroidism of renal origin: Secondary | ICD-10-CM | POA: Diagnosis not present

## 2017-07-24 DIAGNOSIS — N186 End stage renal disease: Secondary | ICD-10-CM | POA: Diagnosis not present

## 2017-07-24 NOTE — Telephone Encounter (Signed)
[facility] and requested a copy of her INR results. Waiting for fax.

## 2017-07-24 NOTE — Telephone Encounter (Signed)
Copied from [address] [id number]. Topic: General - Other >> Jul 24, 2017 12:30 PM Yvette Rack wrote: Reason for CRM: Jeani Hawking with [facility] called in stating she wanted someone to know that the pt INR was checked today 07/24/17 and it is 10.10. Jeani Hawking stated the pt told her she was taking Coumadin but she was taken off of it but does not know why. Pt would like to be contacted.

## 2017-07-24 NOTE — Telephone Encounter (Signed)
Fax received and given to PCP confirming the reported results. Again she is not on a blood thinner and these results do seem highly inaccurate. PCP requested repeat lab draw today to reassess, especially giving history of their being inaccuracy with the results from [facility].    I spoke with patient who refuses lab draw today even if we can provide transportation for her. States she has not been contacted by Nephrology/Dialysis center concerning these results. States she feels fine. She does note she has dialysis again on Monday.   I did contact the Dialysis center and spoke with Lyn (sp?) who noted that the patient was present when results were in and that Dr. Joelyn Oms was aware. They rechecked it again while she was present and got the same result. They are going to be redrawing on Monday giving her history of these inaccurate results and that she is asymptomatic. Will make PCP aware.

## 2017-07-24 NOTE — Telephone Encounter (Signed)
The last time they called [place] with an elevated INR level, they were actually reporting on the PT time and NOT the actual INR- which makes a BIG difference.  The pt was sent unnecessarily to the ER based on results that were relayed inaccurately.  We need to verify what we're actually dealing with.  Pt is NOT on coumadin and INR should be normal

## 2017-07-25 LAB — URINE CULTURE
MICRO NUMBER:: 90540107
SPECIMEN QUALITY:: ADEQUATE

## 2017-07-27 DIAGNOSIS — N2581 Secondary hyperparathyroidism of renal origin: Secondary | ICD-10-CM | POA: Diagnosis not present

## 2017-07-27 DIAGNOSIS — I82401 Acute embolism and thrombosis of unspecified deep veins of right lower extremity: Secondary | ICD-10-CM | POA: Diagnosis not present

## 2017-07-27 DIAGNOSIS — N186 End stage renal disease: Secondary | ICD-10-CM | POA: Diagnosis not present

## 2017-07-28 ENCOUNTER — Other Ambulatory Visit: Payer: Self-pay | Admitting: Family Medicine

## 2017-07-28 NOTE — Telephone Encounter (Signed)
Spoke with pt daughter. She is going to stop by dialysis tomorrow and get a med list from them and let us know what mom is suppose to be taking.

## 2017-07-28 NOTE — Telephone Encounter (Signed)
Called and spoke with pt and left a detailed message with daughter to advise that we need the current med list for patient. Including names of medications and times she is taking them.

## 2017-07-29 DIAGNOSIS — N2581 Secondary hyperparathyroidism of renal origin: Secondary | ICD-10-CM | POA: Diagnosis not present

## 2017-07-29 DIAGNOSIS — N186 End stage renal disease: Secondary | ICD-10-CM | POA: Diagnosis not present

## 2017-07-31 DIAGNOSIS — N2581 Secondary hyperparathyroidism of renal origin: Secondary | ICD-10-CM | POA: Diagnosis not present

## 2017-07-31 DIAGNOSIS — N186 End stage renal disease: Secondary | ICD-10-CM | POA: Diagnosis not present

## 2017-08-03 DIAGNOSIS — N2581 Secondary hyperparathyroidism of renal origin: Secondary | ICD-10-CM | POA: Diagnosis not present

## 2017-08-03 DIAGNOSIS — N186 End stage renal disease: Secondary | ICD-10-CM | POA: Diagnosis not present

## 2017-08-05 ENCOUNTER — Telehealth: Payer: Self-pay | Admitting: General Practice

## 2017-08-05 DIAGNOSIS — N2581 Secondary hyperparathyroidism of renal origin: Secondary | ICD-10-CM | POA: Diagnosis not present

## 2017-08-05 DIAGNOSIS — N186 End stage renal disease: Secondary | ICD-10-CM | POA: Diagnosis not present

## 2017-08-05 NOTE — Telephone Encounter (Signed)
Last OV 07/23/17 Clonidine last filled 06/17/17 #180 with 0  Per pt daughter she is no longer on BP meds.

## 2017-08-05 NOTE — Telephone Encounter (Signed)
Still waiting on updated med list from dialysis.  No refills at this time as daughter reports she is off all meds

## 2017-08-05 NOTE — Telephone Encounter (Signed)
Noted, message sent to pharmacy to advise that as of this time she is no longer on this med.

## 2017-08-07 ENCOUNTER — Ambulatory Visit (HOSPITAL_COMMUNITY)
Admission: RE | Admit: 2017-08-07 | Discharge: 2017-08-07 | Disposition: A | Discharge status: Home / Self Care | Payer: Medicare Other | Source: Ambulatory Visit | Attending: Vascular Surgery | Admitting: Vascular Surgery

## 2017-08-07 ENCOUNTER — Encounter: Payer: Self-pay | Admitting: *Deleted

## 2017-08-07 ENCOUNTER — Other Ambulatory Visit: Payer: Self-pay | Admitting: *Deleted

## 2017-08-07 ENCOUNTER — Ambulatory Visit (INDEPENDENT_AMBULATORY_CARE_PROVIDER_SITE_OTHER): Payer: Self-pay | Admitting: Vascular Surgery

## 2017-08-07 ENCOUNTER — Encounter: Payer: Self-pay | Admitting: Vascular Surgery

## 2017-08-07 ENCOUNTER — Other Ambulatory Visit: Payer: Self-pay

## 2017-08-07 VITALS — BP 146/87 | HR 88 | Temp 99.2°F | Resp 16 | Ht 65.0 in | Wt 164.0 lb

## 2017-08-07 DIAGNOSIS — N186 End stage renal disease: Secondary | ICD-10-CM

## 2017-08-07 DIAGNOSIS — Z992 Dependence on renal dialysis: Secondary | ICD-10-CM

## 2017-08-07 DIAGNOSIS — Z48812 Encounter for surgical aftercare following surgery on the circulatory system: Secondary | ICD-10-CM

## 2017-08-07 NOTE — Progress Notes (Signed)
Patient ID: [name] [name], female   DOB: 31-May-1955, 62 y.o.   MRN: [id number]  Reason for Consult: Chronic Kidney Disease (6 wk s/p 1st stage L BVT.)   Referred by Midge Minium, MD  Subjective:     HPI:  [name] [name] is a 62 y.o. female on dialysis via tunneled dialysis catheter Monday Wednesday Friday.  Recently underwent conversion of a left brachiocephalic to brachiobasilic fistula creation.  She is doing well without complaints and her hand is sensorimotor intact.  Past Medical History:  Diagnosis Date  . Adenomatous colon polyp 04/1998  . Anemia   . Carotid artery disease ([place])    Carotid US 1/18: bilateral ICA 1-39, R thyroid lobe nodule (1.9x2.2x3cm); numerous L thyroid lobe nodules - repeat 1 year  . Chronic kidney disease    M/W/[facility] [place]  . Chronic renal failure    post transplant  . Diabetes mellitus   . EBV infection    Per ALP  . Encephalitis    NMDA per [facility]  . Esophagitis    Grade 1 Distal  . Focal seizure (H[facility])    History of d/t Encephalitis  . GERD (gastroesophageal reflux disease)   . Hemorrhoids   . History of subdural hematoma   . Hx of cardiovascular stress test    Lexiscan Myoview (06/2013):  No ischemia, EF 66%; normal.  //  Myoview 12/17: EF 62, no ischemia or scar; Normal  . Hx of echocardiogram    a. Echocardiogram (06/2013):  Mod focal basal hypertrophy, EF 60-65%, normal wall motion, Gr 1 DD, mild AI, mildly dilated ascending aorta (41 mm), mild LAE.; b.  Echo 9/16: mod LVH, EF 60-65%, no RWMA, Gr 1 DD, trivial AI, mild dilated ascending aorta, mild LAE  . Hyperkalemia   . Hyperlipidemia   . Hypertension   . Hypomagnesemia   . Left jugular vein thrombosis    Partial resolved per [facility]  . Metabolic acidosis   . Pneumonia   . Right jugular vein thrombosis    resolved from [facility]  . Seizures ([place])    Family History  Problem Relation Age of Onset  . Kidney disease Paternal Aunt   . Heart disease Mother   . Heart disease  Father   . Colon cancer Neg Hx    Past Surgical History:  Procedure Laterality Date  . A/V FISTULAGRAM Left 06/04/2017   Procedure: A/V FISTULAGRAM;  Surgeon: Conrad Marion, MD;  Location: [place] [facility];  Service: Cardiovascular;  Laterality: Left;  . ABDOMINAL HYSTERECTOMY    . AV FISTULA PLACEMENT  07/04/2005   [place] AV fistula  . AV FISTULA PLACEMENT  08/27/2005  . AV FISTULA PLACEMENT Left 04/29/2017   Procedure: Creation of Left Arm ARTERIOVENOUS BRACHIOCEPHALIC FISTULA;  Surgeon: Elam Dutch, MD;  Location: [place];  Service: Vascular;  Laterality: Left;  . AV FISTULA PLACEMENT Left 06/16/2017   Procedure: BRACHIO_BASCILIC ARTERIOVENOUS (AV) FISTULA CREATION;  Surgeon: Waynetta Sandy, MD;  Location: [place];  Service: Vascular;  Laterality: Left;  . AV FISTULA PLACEMENT W/ PTFE  08/27/2005  . BASCILIC VEIN TRANSPOSITION Left 06/16/2017   Procedure: BRACHIOCEPHALIC TRANSPOSITION  LEFT ARM;  Surgeon: Waynetta Sandy, MD;  Location: [place];  Service: Vascular;  Laterality: Left;  . BRAIN BIOPSY    . CESAREAN SECTION    . DG AV DIALYSIS GRAFT DECLOT OR  07/24/2005   AV Gore-Tex graf  . DG AV DIALYSIS GRAFT DECLOT OR  Thrombosis  right forearm, loop arteriovenous   Thrombosis right forearm, loop arteriovenous graft  . GASTROSTOMY TUBE PLACEMENT    . KIDNEY TRANSPLANT  2009   Both  . REMOVAL OF GASTROSTOMY TUBE    . THROMBECTOMY / ARTERIOVENOUS GRAFT REVISION  10/12/2006  . THROMBECTOMY / ARTERIOVENOUS GRAFT REVISION  10/16/2006    Short Social History:  Social History   Tobacco Use  . Smoking status: Never Smoker  . Smokeless tobacco: Never Used  Substance Use Topics  . Alcohol use: No    Allergies  Allergen Reactions  . Lisinopril Shortness Of Breath, Swelling and Other (See Comments)    Throat irritation also. Patient takes losartan and tolerates fine.  [name] [Oxaprozin] Hives and Dermatitis    Current Outpatient Medications  Medication  Sig Dispense Refill  . acetaminophen (TYLENOL) 650 MG CR tablet Take 650-1,300 mg by mouth every 8 (eight) hours as needed for pain.     [name] atorvastatin (LIPITOR) 40 MG tablet Take 1 tablet (40 mg total) by mouth daily. 90 tablet 1  . cetirizine (ZYRTEC) 10 MG tablet Take 1 tablet (10 mg total) by mouth daily. 30 tablet 11  . clonazePAM (KLONOPIN) 0.5 MG tablet Take 1 tablet (0.5 mg total) by mouth at bedtime. 30 tablet 3  . ferric citrate (AURYXIA) 1 GM 210 MG(Fe) tablet Take 210 mg by mouth See admin instructions. Take 210 mg by mouth three times a day with meals and 210 mg two times a day with snacks    . multivitamin (RENA-VIT) TABS tablet Take 1 tablet by mouth every morning.   11  . omeprazole (PRILOSEC) 40 MG capsule TAKE 1 CAPSULE BY MOUTH EVERY DAY 90 capsule 1  . oxyCODONE-acetaminophen (PERCOCET) 5-325 MG tablet Take 1 tablet by mouth every 6 (six) hours as needed for severe pain. 8 tablet 0  . PROAIR HFA 108 (90 Base) MCG/ACT inhaler INHALE 2 PUFFS BY MOUTH EVERY 4 HOURS AS NEEDED FOR WHEEZING/SHORTNESS OF BREATH  0  . QUEtiapine (SEROQUEL) 100 MG tablet Take 1 tablet (100 mg total) by mouth 3 (three) times daily. 90 tablet 1  . traZODone (DESYREL) 50 MG tablet Take 0.5-1 tablets (25-50 mg total) by mouth at bedtime as needed for sleep. 90 tablet 1   No current facility-administered medications for this visit.     Review of Systems  Constitutional:  Constitutional negative. Musculoskeletal: Musculoskeletal negative.        Objective:  Objective   Vitals:   08/07/17 1547  BP: (!) 146/87  Pulse: 88  Resp: 16  Temp: 99.2 F (37.3 C)  TempSrc: Oral  SpO2: 97%  Weight: 164 lb (74.4 kg)  Height: 5\' 5"  (1.651 m)   Body mass index is 27.29 kg/m.  Physical Exam  Constitutional: She is oriented to person, place, and time. She appears well-developed and well-nourished.  HENT:  Head: Normocephalic.  Neck: Normal range of motion.  Cardiovascular: Normal rate.  Pulses:       Radial pulses are 2+ on the left side.  Musculoskeletal: Normal range of motion. She exhibits no edema.  Strong thrill left upper arm  Neurological: She is alert and oriented to person, place, and time.    Data: I have independently interpreted her dialysis access duplex which demonstrates flow volume 2809 mL/min with diameter 0.59 cm at the antecubital fossa up to 0.94 the upper arm.     Assessment/Plan:     62 year old female status post creation of left first stage basilic vein transposition  fistula.  She will now need a second stage.  We have discussed the risk and benefits including increased wound complications with multiple incisions as well as the risk of injury to the cutaneous nerve associated with the basilic vein.  She demonstrates good understanding we will get her scheduled on a nondialysis day in the near future.     Waynetta Sandy MD [facility]

## 2017-08-08 DIAGNOSIS — N186 End stage renal disease: Secondary | ICD-10-CM | POA: Diagnosis not present

## 2017-08-08 DIAGNOSIS — N2581 Secondary hyperparathyroidism of renal origin: Secondary | ICD-10-CM | POA: Diagnosis not present

## 2017-08-10 DIAGNOSIS — N2581 Secondary hyperparathyroidism of renal origin: Secondary | ICD-10-CM | POA: Diagnosis not present

## 2017-08-10 DIAGNOSIS — N186 End stage renal disease: Secondary | ICD-10-CM | POA: Diagnosis not present

## 2017-08-12 DIAGNOSIS — N186 End stage renal disease: Secondary | ICD-10-CM | POA: Diagnosis not present

## 2017-08-12 DIAGNOSIS — N2581 Secondary hyperparathyroidism of renal origin: Secondary | ICD-10-CM | POA: Diagnosis not present

## 2017-08-13 ENCOUNTER — Other Ambulatory Visit: Payer: Medicare Other

## 2017-08-14 DIAGNOSIS — N2581 Secondary hyperparathyroidism of renal origin: Secondary | ICD-10-CM | POA: Diagnosis not present

## 2017-08-14 DIAGNOSIS — N186 End stage renal disease: Secondary | ICD-10-CM | POA: Diagnosis not present

## 2017-08-17 DIAGNOSIS — N2581 Secondary hyperparathyroidism of renal origin: Secondary | ICD-10-CM | POA: Diagnosis not present

## 2017-08-17 DIAGNOSIS — N186 End stage renal disease: Secondary | ICD-10-CM | POA: Diagnosis not present

## 2017-08-19 DIAGNOSIS — N2581 Secondary hyperparathyroidism of renal origin: Secondary | ICD-10-CM | POA: Diagnosis not present

## 2017-08-19 DIAGNOSIS — N186 End stage renal disease: Secondary | ICD-10-CM | POA: Diagnosis not present

## 2017-08-21 DIAGNOSIS — N2581 Secondary hyperparathyroidism of renal origin: Secondary | ICD-10-CM | POA: Diagnosis not present

## 2017-08-21 DIAGNOSIS — N186 End stage renal disease: Secondary | ICD-10-CM | POA: Diagnosis not present

## 2017-08-22 DIAGNOSIS — N186 End stage renal disease: Secondary | ICD-10-CM | POA: Diagnosis not present

## 2017-08-22 DIAGNOSIS — I150 Renovascular hypertension: Secondary | ICD-10-CM | POA: Diagnosis not present

## 2017-08-22 DIAGNOSIS — Z992 Dependence on renal dialysis: Secondary | ICD-10-CM | POA: Diagnosis not present

## 2017-08-24 DIAGNOSIS — D631 Anemia in chronic kidney disease: Secondary | ICD-10-CM | POA: Diagnosis not present

## 2017-08-24 DIAGNOSIS — N2581 Secondary hyperparathyroidism of renal origin: Secondary | ICD-10-CM | POA: Diagnosis not present

## 2017-08-24 DIAGNOSIS — N186 End stage renal disease: Secondary | ICD-10-CM | POA: Diagnosis not present

## 2017-08-25 ENCOUNTER — Other Ambulatory Visit: Payer: Self-pay | Admitting: Family Medicine

## 2017-08-26 DIAGNOSIS — N186 End stage renal disease: Secondary | ICD-10-CM | POA: Diagnosis not present

## 2017-08-26 DIAGNOSIS — N2581 Secondary hyperparathyroidism of renal origin: Secondary | ICD-10-CM | POA: Diagnosis not present

## 2017-08-26 DIAGNOSIS — D631 Anemia in chronic kidney disease: Secondary | ICD-10-CM | POA: Diagnosis not present

## 2017-08-28 DIAGNOSIS — D631 Anemia in chronic kidney disease: Secondary | ICD-10-CM | POA: Diagnosis not present

## 2017-08-28 DIAGNOSIS — N186 End stage renal disease: Secondary | ICD-10-CM | POA: Diagnosis not present

## 2017-08-28 DIAGNOSIS — N2581 Secondary hyperparathyroidism of renal origin: Secondary | ICD-10-CM | POA: Diagnosis not present

## 2017-08-31 ENCOUNTER — Encounter (HOSPITAL_COMMUNITY): Payer: Self-pay | Admitting: *Deleted

## 2017-08-31 ENCOUNTER — Other Ambulatory Visit: Payer: Self-pay

## 2017-08-31 DIAGNOSIS — N186 End stage renal disease: Secondary | ICD-10-CM | POA: Diagnosis not present

## 2017-08-31 DIAGNOSIS — D631 Anemia in chronic kidney disease: Secondary | ICD-10-CM | POA: Diagnosis not present

## 2017-08-31 DIAGNOSIS — N2581 Secondary hyperparathyroidism of renal origin: Secondary | ICD-10-CM | POA: Diagnosis not present

## 2017-08-31 NOTE — Progress Notes (Signed)
[name] [name] asked for her daughter [name] [name] to take the call for her.  [name] [name] has not had any chest pain or shortness of breath. [name] [name] reports that [name] [name] does not have diabetes now and does not check CBG, and is unaware of last A1C.

## 2017-09-01 ENCOUNTER — Telehealth: Payer: Self-pay | Admitting: Vascular Surgery

## 2017-09-01 ENCOUNTER — Ambulatory Visit (HOSPITAL_COMMUNITY): Payer: Medicare Other | Admitting: Certified Registered Nurse Anesthetist

## 2017-09-01 ENCOUNTER — Ambulatory Visit (HOSPITAL_COMMUNITY)
Admission: RE | Admit: 2017-09-01 | Discharge: 2017-09-01 | Disposition: A | Discharge status: Home / Self Care | Payer: Medicare Other | Source: Ambulatory Visit | Attending: Vascular Surgery | Admitting: Vascular Surgery

## 2017-09-01 ENCOUNTER — Encounter (HOSPITAL_COMMUNITY): Payer: Self-pay | Admitting: Certified Registered Nurse Anesthetist

## 2017-09-01 ENCOUNTER — Encounter (HOSPITAL_COMMUNITY)
Admission: RE | Disposition: A | Discharge status: Home / Self Care | Payer: Self-pay | Source: Ambulatory Visit | Attending: Vascular Surgery

## 2017-09-01 DIAGNOSIS — Z940 Kidney transplant status: Secondary | ICD-10-CM | POA: Diagnosis not present

## 2017-09-01 DIAGNOSIS — K219 Gastro-esophageal reflux disease without esophagitis: Secondary | ICD-10-CM | POA: Diagnosis not present

## 2017-09-01 DIAGNOSIS — R569 Unspecified convulsions: Secondary | ICD-10-CM | POA: Insufficient documentation

## 2017-09-01 DIAGNOSIS — I132 Hypertensive heart and chronic kidney disease with heart failure and with stage 5 chronic kidney disease, or end stage renal disease: Secondary | ICD-10-CM | POA: Diagnosis not present

## 2017-09-01 DIAGNOSIS — Z888 Allergy status to other drugs, medicaments and biological substances status: Secondary | ICD-10-CM | POA: Insufficient documentation

## 2017-09-01 DIAGNOSIS — N186 End stage renal disease: Secondary | ICD-10-CM | POA: Diagnosis not present

## 2017-09-01 DIAGNOSIS — Z79899 Other long term (current) drug therapy: Secondary | ICD-10-CM | POA: Diagnosis not present

## 2017-09-01 DIAGNOSIS — E1122 Type 2 diabetes mellitus with diabetic chronic kidney disease: Secondary | ICD-10-CM | POA: Diagnosis not present

## 2017-09-01 DIAGNOSIS — E785 Hyperlipidemia, unspecified: Secondary | ICD-10-CM | POA: Diagnosis not present

## 2017-09-01 DIAGNOSIS — Z992 Dependence on renal dialysis: Secondary | ICD-10-CM | POA: Diagnosis not present

## 2017-09-01 DIAGNOSIS — N185 Chronic kidney disease, stage 5: Secondary | ICD-10-CM | POA: Diagnosis not present

## 2017-09-01 DIAGNOSIS — I5032 Chronic diastolic (congestive) heart failure: Secondary | ICD-10-CM | POA: Diagnosis not present

## 2017-09-01 DIAGNOSIS — E1151 Type 2 diabetes mellitus with diabetic peripheral angiopathy without gangrene: Secondary | ICD-10-CM | POA: Insufficient documentation

## 2017-09-01 DIAGNOSIS — I509 Heart failure, unspecified: Secondary | ICD-10-CM | POA: Diagnosis not present

## 2017-09-01 HISTORY — DX: Anxiety disorder, unspecified: F41.9

## 2017-09-01 HISTORY — DX: Unspecified osteoarthritis, unspecified site: M19.90

## 2017-09-01 HISTORY — DX: Personal history of urinary calculi: Z87.442

## 2017-09-01 HISTORY — DX: Major depressive disorder, single episode, unspecified: F32.9

## 2017-09-01 HISTORY — PX: BASCILIC VEIN TRANSPOSITION: SHX5742

## 2017-09-01 HISTORY — DX: Depression, unspecified: F32.A

## 2017-09-01 LAB — POCT I-STAT 4, (NA,K, GLUC, HGB,HCT)
GLUCOSE: 122 mg/dL — AB (ref 65–99)
HEMATOCRIT: 36 % (ref 36.0–46.0)
Hemoglobin: 12.2 g/dL (ref 12.0–15.0)
Potassium: 4.1 mmol/L (ref 3.5–5.1)
SODIUM: 142 mmol/L (ref 135–145)

## 2017-09-01 LAB — GLUCOSE, CAPILLARY
Glucose-Capillary: 124 mg/dL — ABNORMAL HIGH (ref 65–99)
Glucose-Capillary: 120 mg/dL — ABNORMAL HIGH (ref 65–99)
Glucose-Capillary: 104 mg/dL — ABNORMAL HIGH (ref 65–99)

## 2017-09-01 SURGERY — TRANSPOSITION, VEIN, BASILIC
Anesthesia: General | Laterality: Left

## 2017-09-01 MED ORDER — LIDOCAINE 2% (20 MG/ML) 5 ML SYRINGE
INTRAMUSCULAR | Status: DC | PRN
  Administered 2017-09-01: 100 mg via INTRAVENOUS

## 2017-09-01 MED ORDER — OXYCODONE-ACETAMINOPHEN 5-325 MG PO TABS: 1.0000 | ORAL_TABLET | Freq: Four times a day (QID) | ORAL | 0 refills | Status: DC | PRN

## 2017-09-01 MED ORDER — SODIUM CHLORIDE 0.9 % IV SOLN: INTRAVENOUS | Status: DC

## 2017-09-01 MED ORDER — ONDANSETRON HCL 4 MG/2ML IJ SOLN
INTRAMUSCULAR | Status: AC
  Filled 2017-09-01: qty 2

## 2017-09-01 MED ORDER — 0.9 % SODIUM CHLORIDE (POUR BTL) OPTIME
TOPICAL | Status: DC | PRN
  Administered 2017-09-01: 1000 mL

## 2017-09-01 MED ORDER — SODIUM CHLORIDE 0.9 % IV SOLN
INTRAVENOUS | Status: DC
  Administered 2017-09-01: 10 mL/h via INTRAVENOUS

## 2017-09-01 MED ORDER — PHENYLEPHRINE HCL 10 MG/ML IJ SOLN
INTRAMUSCULAR | Status: DC | PRN
  Administered 2017-09-01: 10 ug/min via INTRAVENOUS

## 2017-09-01 MED ORDER — CEFAZOLIN SODIUM-DEXTROSE 2-4 GM/100ML-% IV SOLN
2.0000 g | INTRAVENOUS | Status: AC
  Administered 2017-09-01: 2 g via INTRAVENOUS

## 2017-09-01 MED ORDER — PROPOFOL 10 MG/ML IV BOLUS
INTRAVENOUS | Status: DC | PRN
  Administered 2017-09-01: 150 mg via INTRAVENOUS
  Administered 2017-09-01: 40 mg via INTRAVENOUS

## 2017-09-01 MED ORDER — PHENYLEPHRINE HCL 10 MG/ML IJ SOLN: INTRAVENOUS | Status: DC | PRN

## 2017-09-01 MED ORDER — PHENYLEPHRINE 40 MCG/ML (10ML) SYRINGE FOR IV PUSH (FOR BLOOD PRESSURE SUPPORT): PREFILLED_SYRINGE | INTRAVENOUS | Status: DC | PRN

## 2017-09-01 MED ORDER — CEFAZOLIN SODIUM-DEXTROSE 2-4 GM/100ML-% IV SOLN
INTRAVENOUS | Status: AC
  Filled 2017-09-01: qty 100

## 2017-09-01 MED ORDER — ONDANSETRON HCL 4 MG/2ML IJ SOLN
INTRAMUSCULAR | Status: DC | PRN
  Administered 2017-09-01: 4 mg via INTRAVENOUS

## 2017-09-01 MED ORDER — FENTANYL CITRATE (PF) 100 MCG/2ML IJ SOLN
INTRAMUSCULAR | Status: DC | PRN
  Administered 2017-09-01 (×5): 50 ug via INTRAVENOUS

## 2017-09-01 MED ORDER — FENTANYL CITRATE (PF) 250 MCG/5ML IJ SOLN
INTRAMUSCULAR | Status: AC
  Filled 2017-09-01: qty 5

## 2017-09-01 MED ORDER — PROPOFOL 10 MG/ML IV BOLUS
INTRAVENOUS | Status: AC
  Filled 2017-09-01: qty 20

## 2017-09-01 MED ORDER — LIDOCAINE-EPINEPHRINE (PF) 1 %-1:200000 IJ SOLN
INTRAMUSCULAR | Status: AC
  Filled 2017-09-01: qty 30

## 2017-09-01 MED ORDER — SUCCINYLCHOLINE CHLORIDE 20 MG/ML IJ SOLN
INTRAMUSCULAR | Status: DC | PRN
  Administered 2017-09-01: 100 mg via INTRAVENOUS

## 2017-09-01 MED ORDER — SODIUM CHLORIDE 0.9 % IV SOLN
INTRAVENOUS | Status: DC | PRN
  Administered 2017-09-01: 11:00:00

## 2017-09-01 MED ORDER — LIDOCAINE 2% (20 MG/ML) 5 ML SYRINGE
INTRAMUSCULAR | Status: AC
  Filled 2017-09-01: qty 5

## 2017-09-01 MED ORDER — SODIUM CHLORIDE 0.9 % IV SOLN
INTRAVENOUS | Status: AC
  Filled 2017-09-01: qty 1.2

## 2017-09-01 SURGICAL SUPPLY — 33 items
ADH SKN CLS APL DERMABOND .7 (GAUZE/BANDAGES/DRESSINGS) ×1
ARMBAND PINK RESTRICT EXTREMIT (MISCELLANEOUS) ×2 IMPLANT
CANISTER SUCT 3000ML PPV (MISCELLANEOUS) ×2 IMPLANT
CLIP VESOCCLUDE MED 24/CT (CLIP) ×1 IMPLANT
CLIP VESOCCLUDE MED 6/CT (CLIP) IMPLANT
CLIP VESOCCLUDE SM WIDE 24/CT (CLIP) ×1 IMPLANT
CLIP VESOCCLUDE SM WIDE 6/CT (CLIP) IMPLANT
COVER PROBE W GEL 5X96 (DRAPES) ×2 IMPLANT
DERMABOND ADVANCED (GAUZE/BANDAGES/DRESSINGS) ×1
DERMABOND ADVANCED .7 DNX12 (GAUZE/BANDAGES/DRESSINGS) ×1 IMPLANT
ELECT REM PT RETURN 9FT ADLT (ELECTROSURGICAL) ×2
ELECTRODE REM PT RTRN 9FT ADLT (ELECTROSURGICAL) ×1 IMPLANT
GLOVE BIO SURGEON STRL SZ 6.5 (GLOVE) ×2 IMPLANT
GLOVE BIO SURGEON STRL SZ7.5 (GLOVE) ×3 IMPLANT
GLOVE BIOGEL PI IND STRL 6.5 (GLOVE) IMPLANT
GLOVE BIOGEL PI INDICATOR 6.5 (GLOVE) ×2
GOWN STRL REUS W/ TWL LRG LVL3 (GOWN DISPOSABLE) ×2 IMPLANT
GOWN STRL REUS W/ TWL XL LVL3 (GOWN DISPOSABLE) ×1 IMPLANT
GOWN STRL REUS W/TWL LRG LVL3 (GOWN DISPOSABLE) ×2
GOWN STRL REUS W/TWL XL LVL3 (GOWN DISPOSABLE) ×4
KIT BASIN OR (CUSTOM PROCEDURE TRAY) ×2 IMPLANT
KIT TURNOVER KIT B (KITS) ×2 IMPLANT
NS IRRIG 1000ML POUR BTL (IV SOLUTION) ×2 IMPLANT
PACK CV ACCESS (CUSTOM PROCEDURE TRAY) ×2 IMPLANT
PAD ARMBOARD 7.5X6 YLW CONV (MISCELLANEOUS) ×4 IMPLANT
SUT MNCRL AB 4-0 PS2 18 (SUTURE) ×3 IMPLANT
SUT PROLENE 6 0 BV (SUTURE) ×4 IMPLANT
SUT SILK 2 0 SH (SUTURE) IMPLANT
SUT VIC AB 3-0 SH 27 (SUTURE) ×4
SUT VIC AB 3-0 SH 27X BRD (SUTURE) ×1 IMPLANT
TOWEL GREEN STERILE (TOWEL DISPOSABLE) ×2 IMPLANT
UNDERPAD 30X30 (UNDERPADS AND DIAPERS) ×2 IMPLANT
WATER STERILE IRR 1000ML POUR (IV SOLUTION) ×2 IMPLANT

## 2017-09-01 NOTE — Anesthesia Procedure Notes (Signed)
Procedure Name: LMA Insertion Date/Time: 09/01/2017 10:39 AM Performed by: Colin Benton, CRNA Pre-anesthesia Checklist: Patient identified, Emergency Drugs available, Suction available and Patient being monitored Patient Re-evaluated:Patient Re-evaluated prior to induction Oxygen Delivery Method: Circle system utilized Preoxygenation: Pre-oxygenation with 100% oxygen Induction Type: IV induction Ventilation: Mask ventilation without difficulty LMA: LMA inserted LMA Size: 4.0 Number of attempts: 1 Placement Confirmation: positive ETCO2 and breath sounds checked- equal and bilateral Tube secured with: Tape Dental Injury: Teeth and Oropharynx as per pre-operative assessment

## 2017-09-01 NOTE — Telephone Encounter (Signed)
sch appt spk to daughter 10/02/17 3pm f/u PA

## 2017-09-01 NOTE — Anesthesia Postprocedure Evaluation (Signed)
Anesthesia Post Note  Patient: [name] [name]  Procedure(s) Performed: BASILIC VEIN TRANSPOSITION SECOND STAGE (Left )     Patient location during evaluation: PACU Anesthesia Type: General Level of consciousness: awake Pain management: pain level controlled Vital Signs Assessment: post-procedure vital signs reviewed and stable Respiratory status: spontaneous breathing Cardiovascular status: stable Anesthetic complications: no    Last Vitals:  Vitals:   09/01/17 0755 09/01/17 1230  BP: 128/72   Pulse: 79   Resp: 20   Temp: 36.9 C (!) 36.3 C  SpO2: 100%     Last Pain:  Vitals:   09/01/17 1230  TempSrc:   PainSc: Asleep                 Lurdes Haltiwanger

## 2017-09-01 NOTE — Discharge Instructions (Signed)
° °  [facility]  Discharge Instructions  AV Fistula or Graft Surgery for Dialysis Access  Please refer to the following instructions for your post-procedure care. Your surgeon or physician assistant will discuss any changes with you.  Activity  You may drive the day following your surgery, if you are comfortable and no longer taking prescription pain medication. Resume full activity as the soreness in your incision resolves.  Bathing/Showering  You may shower after you go home. Keep your incision dry for 48 hours. Do not soak in a bathtub, hot tub, or swim until the incision heals completely. You may not shower if you have a hemodialysis catheter.  Incision Care  Clean your incision with mild soap and water after 48 hours. Pat the area dry with a clean towel. You do not need a bandage unless otherwise instructed. Do not apply any ointments or creams to your incision. You may have skin glue on your incision. Do not peel it off. It will come off on its own in about one week. Your arm may swell a bit after surgery. To reduce swelling use pillows to elevate your arm so it is above your heart. Your doctor will tell you if you need to lightly wrap your arm with an ACE bandage.  Diet  Resume your normal diet. There are not special food restrictions following this procedure. In order to heal from your surgery, it is CRITICAL to get adequate nutrition. Your body requires vitamins, minerals, and protein. Vegetables are the best source of vitamins and minerals. Vegetables also provide the perfect balance of protein. Processed food has little nutritional value, so try to avoid this.  Medications  Resume taking all of your medications. If your incision is causing pain, you may take over-the counter pain relievers such as acetaminophen (Tylenol). If you were prescribed a stronger pain medication, please be aware these medications can cause nausea and constipation. Prevent  nausea by taking the medication with a snack or meal. Avoid constipation by drinking plenty of fluids and eating foods with high amount of fiber, such as fruits, vegetables, and grains.  Do not take Tylenol if you are taking prescription pain medications.  Follow up Your surgeon may want to see you in the office following your access surgery. If so, this will be arranged at the time of your surgery.  Please call us immediately for any of the following conditions:  Increased pain, redness, drainage (pus) from your incision site Fever of 101 degrees or higher Severe or worsening pain at your incision site Hand pain or numbness.  Reduce your risk of vascular disease:  Stop smoking. If you would like help, call [facility] at 1-800-QUIT-NOW 4162808646) or [place] at [place] your cholesterol Maintain a desired weight Control your diabetes Keep your blood pressure down  Dialysis  It will take several weeks to several months for your new dialysis access to be ready for use. Your surgeon will determine when it is okay to use it. Your nephrologist will continue to direct your dialysis. You can continue to use your Permcath until your new access is ready for use.   09/01/2017 [name] [name] [id number] 06-24-1955  Surgeon(s): Waynetta Sandy, MD  Procedure(s): BASILIC VEIN TRANSPOSITION SECOND STAGE  x Do not stick fistula for 6 weeks    If you have any questions, please call the office at [phone].

## 2017-09-01 NOTE — H&P (Signed)
   History and Physical Update  The patient was interviewed and re-examined.  The patient's previous History and Physical has been reviewed and is unchanged from previous office visit.   Brandon C. Donzetta Matters, MD [facility] [place] Office: [phone] Pager: [phone]   09/01/2017, 10:07 AM

## 2017-09-01 NOTE — Telephone Encounter (Signed)
-----   Message from Penni Homans, RN sent at 09/01/2017  3:08 PM EDT ----- Regarding: Appointment   ----- Message ----- From: Gabriel Earing, PA-C Sent: 09/01/2017  12:19 PM To: Vvs Charge Pool  S/p left 2nd stage BVT 09/01/17.  F/u either with Dr. Donzetta Matters or [facility] on his office day in 2-3 weeks.  Thanks [employer]

## 2017-09-01 NOTE — Anesthesia Preprocedure Evaluation (Signed)
Anesthesia Evaluation  Patient identified by MRN, date of birth, ID band Patient awake    Reviewed: Allergy & Precautions, NPO status , Patient's Chart, lab work & pertinent test results  Airway Mallampati: II  TM Distance: >3 FB     Dental   Pulmonary pneumonia,    breath sounds clear to auscultation       Cardiovascular hypertension, + Peripheral Vascular Disease and +CHF   Rhythm:Regular Rate:Normal     Neuro/Psych    GI/Hepatic Neg liver ROS, GERD  ,  Endo/Other  diabetes  Renal/GU Renal disease     Musculoskeletal   Abdominal   Peds  Hematology   Anesthesia Other Findings   Reproductive/Obstetrics                             Anesthesia Physical Anesthesia Plan  ASA: III  Anesthesia Plan: General   Post-op Pain Management:    Induction: Intravenous  PONV Risk Score and Plan: 3 and Treatment may vary due to age or medical condition  Airway Management Planned: LMA  Additional Equipment:   Intra-op Plan:   Post-operative Plan: Extubation in OR  Informed Consent: I have reviewed the patients History and Physical, chart, labs and discussed the procedure including the risks, benefits and alternatives for the proposed anesthesia with the patient or authorized representative who has indicated his/her understanding and acceptance.   Dental advisory given  Plan Discussed with: CRNA and Anesthesiologist  Anesthesia Plan Comments:         Anesthesia Quick Evaluation

## 2017-09-01 NOTE — Transfer of Care (Signed)
Immediate Anesthesia Transfer of Care Note  Patient: [name] [name]  Procedure(s) Performed: BASILIC VEIN TRANSPOSITION SECOND STAGE (Left )  Patient Location: PACU  Anesthesia Type:General  Level of Consciousness: drowsy and patient cooperative  Airway & Oxygen Therapy: Patient Spontanous Breathing and Patient connected to nasal cannula oxygen  Post-op Assessment: Report given to RN and Post -op Vital signs reviewed and stable  Post vital signs: Reviewed and stable  Last Vitals:  Vitals Value Taken Time  BP 104/58 09/01/2017 12:31 PM  Temp    Pulse 87 09/01/2017 12:33 PM  Resp 9 09/01/2017 12:33 PM  SpO2 96 % 09/01/2017 12:33 PM  Vitals shown include unvalidated device data.  Last Pain:  Vitals:   09/01/17 0820  TempSrc:   PainSc: 3       Patients Stated Pain Goal: 3 (04/79/98 7215)  Complications: No apparent anesthesia complications

## 2017-09-01 NOTE — Op Note (Signed)
    Patient name: [name] [name] MRN: [id number] DOB: May 02, 1955 Sex: female  09/01/2017 Pre-operative Diagnosis: End-stage renal disease  Post-operative diagnosis:  Same Surgeon:  Erlene Quan C. Donzetta Matters, MD Assistant: Leontine Locket, PA Procedure Performed:  Left second stage basilic vein transposition fistula.  Indications: 62 year old female with end-stage renal disease on dialysis via catheter.  She has a left first stage basilic vein fistula now indicated for the second stage.  Findings: Basilic vein throughout its course was approximately 1 cm.  At completion of the strong thrill as well as a palpable radial pulse confirmed with Doppler.   Procedure:  The patient was identified in the holding area and taken to the operating room where she was placed supine the operative table and general anesthesia was induced.  She was sterilely prepped draped the left arm the usual fashion given antibiotics and timeout was called.  We used ultrasound to identify the fistula tracer throughout its course.  We made an incision above the antecubitum dissected down onto the vein protecting the nerve.  We then made a second incision up near the axilla divided branches between clips and ties.  There was one large branch this was clamped the stay side was tied off multiple times with the branches.  We then clamped the fistula proximally after marking for its orientation and divided it near the antecubitum.  We pulled it through the axillary incision oversewed the one branch that was clamped.  We then flushed with heparinized saline there was no further bleeding from it marked for orientation again tunneled towards the antecubitum.  We then clamped it and sewed end-to-end with 6-0 Prolene suture x2.  Prior to releasing the clamps we allowed flushing both directions.  At completion there was a strong thrill in the runoff vein.  There is also palpable radial pulse at the wrist.  We then irrigated the wounds closed in layers  with Vicryl Monocryl.  She was then allowed away from anesthesia having tolerated procedure well without immediate complication.    EBL 50 cc.   Brandon C. Donzetta Matters, MD [facility] [place] Office: [phone] Pager: [phone]

## 2017-09-01 NOTE — Anesthesia Procedure Notes (Signed)
Procedure Name: Intubation Date/Time: 09/01/2017 10:59 AM Performed by: Colin Benton, CRNA Pre-anesthesia Checklist: Patient identified, Emergency Drugs available, Suction available and Patient being monitored Patient Re-evaluated:Patient Re-evaluated prior to induction Oxygen Delivery Method: Circle System Utilized Preoxygenation: Pre-oxygenation with 100% oxygen Laryngoscope Size: Mac and 3 Grade View: Grade I Tube type: Oral Number of attempts: 1 Airway Equipment and Method: Stylet Placement Confirmation: ETT inserted through vocal cords under direct vision,  positive ETCO2 and breath sounds checked- equal and bilateral Secured at: 21 cm Tube secured with: Tape Dental Injury: Teeth and Oropharynx as per pre-operative assessment  Comments: Difficulty delivering adequate TV with LMA.  Dr. Nyoka Cowden called to bedside.  Decision made to exchange LMA for ET tube.  Tolerated exchange well.

## 2017-09-02 ENCOUNTER — Encounter (HOSPITAL_COMMUNITY): Payer: Self-pay | Admitting: Vascular Surgery

## 2017-09-02 DIAGNOSIS — D631 Anemia in chronic kidney disease: Secondary | ICD-10-CM | POA: Diagnosis not present

## 2017-09-02 DIAGNOSIS — N2581 Secondary hyperparathyroidism of renal origin: Secondary | ICD-10-CM | POA: Diagnosis not present

## 2017-09-02 DIAGNOSIS — N186 End stage renal disease: Secondary | ICD-10-CM | POA: Diagnosis not present

## 2017-09-04 DIAGNOSIS — N186 End stage renal disease: Secondary | ICD-10-CM | POA: Diagnosis not present

## 2017-09-04 DIAGNOSIS — D631 Anemia in chronic kidney disease: Secondary | ICD-10-CM | POA: Diagnosis not present

## 2017-09-04 DIAGNOSIS — N2581 Secondary hyperparathyroidism of renal origin: Secondary | ICD-10-CM | POA: Diagnosis not present

## 2017-09-07 DIAGNOSIS — N2581 Secondary hyperparathyroidism of renal origin: Secondary | ICD-10-CM | POA: Diagnosis not present

## 2017-09-07 DIAGNOSIS — D631 Anemia in chronic kidney disease: Secondary | ICD-10-CM | POA: Diagnosis not present

## 2017-09-07 DIAGNOSIS — N186 End stage renal disease: Secondary | ICD-10-CM | POA: Diagnosis not present

## 2017-09-09 DIAGNOSIS — N2581 Secondary hyperparathyroidism of renal origin: Secondary | ICD-10-CM | POA: Diagnosis not present

## 2017-09-09 DIAGNOSIS — D631 Anemia in chronic kidney disease: Secondary | ICD-10-CM | POA: Diagnosis not present

## 2017-09-09 DIAGNOSIS — N186 End stage renal disease: Secondary | ICD-10-CM | POA: Diagnosis not present

## 2017-09-11 DIAGNOSIS — N186 End stage renal disease: Secondary | ICD-10-CM | POA: Diagnosis not present

## 2017-09-11 DIAGNOSIS — D631 Anemia in chronic kidney disease: Secondary | ICD-10-CM | POA: Diagnosis not present

## 2017-09-11 DIAGNOSIS — N2581 Secondary hyperparathyroidism of renal origin: Secondary | ICD-10-CM | POA: Diagnosis not present

## 2017-09-14 DIAGNOSIS — N186 End stage renal disease: Secondary | ICD-10-CM | POA: Diagnosis not present

## 2017-09-14 DIAGNOSIS — D631 Anemia in chronic kidney disease: Secondary | ICD-10-CM | POA: Diagnosis not present

## 2017-09-14 DIAGNOSIS — N2581 Secondary hyperparathyroidism of renal origin: Secondary | ICD-10-CM | POA: Diagnosis not present

## 2017-09-16 DIAGNOSIS — N2581 Secondary hyperparathyroidism of renal origin: Secondary | ICD-10-CM | POA: Diagnosis not present

## 2017-09-16 DIAGNOSIS — N186 End stage renal disease: Secondary | ICD-10-CM | POA: Diagnosis not present

## 2017-09-16 DIAGNOSIS — D631 Anemia in chronic kidney disease: Secondary | ICD-10-CM | POA: Diagnosis not present

## 2017-09-17 ENCOUNTER — Other Ambulatory Visit: Payer: Self-pay | Admitting: Family Medicine

## 2017-09-17 NOTE — Telephone Encounter (Signed)
Last OV 07/23/17 Clonazepam last filled 04/10/17 #30 with 3

## 2017-09-18 DIAGNOSIS — N186 End stage renal disease: Secondary | ICD-10-CM | POA: Diagnosis not present

## 2017-09-18 DIAGNOSIS — D631 Anemia in chronic kidney disease: Secondary | ICD-10-CM | POA: Diagnosis not present

## 2017-09-18 DIAGNOSIS — N2581 Secondary hyperparathyroidism of renal origin: Secondary | ICD-10-CM | POA: Diagnosis not present

## 2017-09-21 DIAGNOSIS — Z992 Dependence on renal dialysis: Secondary | ICD-10-CM | POA: Diagnosis not present

## 2017-09-21 DIAGNOSIS — I150 Renovascular hypertension: Secondary | ICD-10-CM | POA: Diagnosis not present

## 2017-09-21 DIAGNOSIS — N186 End stage renal disease: Secondary | ICD-10-CM | POA: Diagnosis not present

## 2017-09-21 DIAGNOSIS — N2581 Secondary hyperparathyroidism of renal origin: Secondary | ICD-10-CM | POA: Diagnosis not present

## 2017-09-21 DIAGNOSIS — D631 Anemia in chronic kidney disease: Secondary | ICD-10-CM | POA: Diagnosis not present

## 2017-09-21 DIAGNOSIS — Z23 Encounter for immunization: Secondary | ICD-10-CM | POA: Diagnosis not present

## 2017-09-21 DIAGNOSIS — E876 Hypokalemia: Secondary | ICD-10-CM | POA: Diagnosis not present

## 2017-09-21 DIAGNOSIS — D509 Iron deficiency anemia, unspecified: Secondary | ICD-10-CM | POA: Diagnosis not present

## 2017-09-22 ENCOUNTER — Ambulatory Visit: Payer: Self-pay | Admitting: Family Medicine

## 2017-09-23 DIAGNOSIS — D509 Iron deficiency anemia, unspecified: Secondary | ICD-10-CM | POA: Diagnosis not present

## 2017-09-23 DIAGNOSIS — E876 Hypokalemia: Secondary | ICD-10-CM | POA: Diagnosis not present

## 2017-09-23 DIAGNOSIS — D631 Anemia in chronic kidney disease: Secondary | ICD-10-CM | POA: Diagnosis not present

## 2017-09-23 DIAGNOSIS — Z23 Encounter for immunization: Secondary | ICD-10-CM | POA: Diagnosis not present

## 2017-09-23 DIAGNOSIS — N186 End stage renal disease: Secondary | ICD-10-CM | POA: Diagnosis not present

## 2017-09-23 DIAGNOSIS — N2581 Secondary hyperparathyroidism of renal origin: Secondary | ICD-10-CM | POA: Diagnosis not present

## 2017-09-25 DIAGNOSIS — Z23 Encounter for immunization: Secondary | ICD-10-CM | POA: Diagnosis not present

## 2017-09-25 DIAGNOSIS — E876 Hypokalemia: Secondary | ICD-10-CM | POA: Diagnosis not present

## 2017-09-25 DIAGNOSIS — D631 Anemia in chronic kidney disease: Secondary | ICD-10-CM | POA: Diagnosis not present

## 2017-09-25 DIAGNOSIS — D509 Iron deficiency anemia, unspecified: Secondary | ICD-10-CM | POA: Diagnosis not present

## 2017-09-25 DIAGNOSIS — N2581 Secondary hyperparathyroidism of renal origin: Secondary | ICD-10-CM | POA: Diagnosis not present

## 2017-09-25 DIAGNOSIS — N186 End stage renal disease: Secondary | ICD-10-CM | POA: Diagnosis not present

## 2017-09-28 DIAGNOSIS — D509 Iron deficiency anemia, unspecified: Secondary | ICD-10-CM | POA: Diagnosis not present

## 2017-09-28 DIAGNOSIS — Z23 Encounter for immunization: Secondary | ICD-10-CM | POA: Diagnosis not present

## 2017-09-28 DIAGNOSIS — E876 Hypokalemia: Secondary | ICD-10-CM | POA: Diagnosis not present

## 2017-09-28 DIAGNOSIS — D631 Anemia in chronic kidney disease: Secondary | ICD-10-CM | POA: Diagnosis not present

## 2017-09-28 DIAGNOSIS — N2581 Secondary hyperparathyroidism of renal origin: Secondary | ICD-10-CM | POA: Diagnosis not present

## 2017-09-28 DIAGNOSIS — N186 End stage renal disease: Secondary | ICD-10-CM | POA: Diagnosis not present

## 2017-09-30 DIAGNOSIS — N2581 Secondary hyperparathyroidism of renal origin: Secondary | ICD-10-CM | POA: Diagnosis not present

## 2017-09-30 DIAGNOSIS — D509 Iron deficiency anemia, unspecified: Secondary | ICD-10-CM | POA: Diagnosis not present

## 2017-09-30 DIAGNOSIS — N186 End stage renal disease: Secondary | ICD-10-CM | POA: Diagnosis not present

## 2017-09-30 DIAGNOSIS — D631 Anemia in chronic kidney disease: Secondary | ICD-10-CM | POA: Diagnosis not present

## 2017-09-30 DIAGNOSIS — Z23 Encounter for immunization: Secondary | ICD-10-CM | POA: Diagnosis not present

## 2017-09-30 DIAGNOSIS — E876 Hypokalemia: Secondary | ICD-10-CM | POA: Diagnosis not present

## 2017-10-01 ENCOUNTER — Encounter: Payer: Self-pay | Admitting: Family Medicine

## 2017-10-01 ENCOUNTER — Encounter: Payer: Self-pay | Admitting: General Practice

## 2017-10-01 ENCOUNTER — Telehealth: Payer: Self-pay | Admitting: *Deleted

## 2017-10-01 ENCOUNTER — Other Ambulatory Visit: Payer: Self-pay

## 2017-10-01 ENCOUNTER — Ambulatory Visit (INDEPENDENT_AMBULATORY_CARE_PROVIDER_SITE_OTHER): Payer: Medicare Other | Admitting: Family Medicine

## 2017-10-01 VITALS — BP 120/72 | HR 85 | Temp 98.8°F | Resp 16 | Ht 65.0 in | Wt 167.4 lb

## 2017-10-01 DIAGNOSIS — M79644 Pain in right finger(s): Secondary | ICD-10-CM | POA: Diagnosis not present

## 2017-10-01 DIAGNOSIS — E1122 Type 2 diabetes mellitus with diabetic chronic kidney disease: Secondary | ICD-10-CM

## 2017-10-01 DIAGNOSIS — Z992 Dependence on renal dialysis: Secondary | ICD-10-CM | POA: Diagnosis not present

## 2017-10-01 DIAGNOSIS — N186 End stage renal disease: Secondary | ICD-10-CM | POA: Diagnosis not present

## 2017-10-01 DIAGNOSIS — M25552 Pain in left hip: Secondary | ICD-10-CM | POA: Diagnosis not present

## 2017-10-01 LAB — BASIC METABOLIC PANEL
BUN: 30 mg/dL — AB (ref 6–23)
CHLORIDE: 95 meq/L — AB (ref 96–112)
CO2: 30 meq/L (ref 19–32)
CREATININE: 6.89 mg/dL — AB (ref 0.40–1.20)
Calcium: 9.8 mg/dL (ref 8.4–10.5)
GFR: 7.85 mL/min — CL (ref >60.00)
GLUCOSE: 132 mg/dL — AB (ref 70–99)
POTASSIUM: 4.1 meq/L (ref 3.5–5.1)
Sodium: 139 mEq/L (ref 135–145)

## 2017-10-01 LAB — HEMOGLOBIN A1C: Hgb A1c MFr Bld: 6.6 % — ABNORMAL HIGH (ref 4.6–6.5)

## 2017-10-01 MED ORDER — MELOXICAM 15 MG PO TABS: 15.0000 mg | ORAL_TABLET | Freq: Every day | ORAL | 1 refills | Status: DC

## 2017-10-01 NOTE — Progress Notes (Signed)
   Subjective:    Patient ID: RIE MCNEIL, female    DOB: [birth year], 62 y.o.   MRN: [id number]  HPI DM- chronic problem.  Last A1C 7.9  Pt is not currently on medication and attempting to control w/ diet and exercise.  No need for microalbumin due to HD.  Due for eye exam.  Pt admits to not following a low carb diet.  Not checking sugars.  No CP, SOB, HAs, visual changes, abd pain, N/V.  + numbness of R middle finger.  L hip pain- pt reports intermittent pain 'for awhile' but this is worsening.  Pain occurs w/ activity and is localized to lateral L buttock and will radiate into L leg.  No relief w/ tylenol.   Review of Systems For ROS see HPI     Objective:   Physical Exam  Constitutional: She is oriented to person, place, and time. She appears well-developed and well-nourished. No distress.  HENT:  Head: Normocephalic and atraumatic.  Eyes: Pupils are equal, round, and reactive to light. Conjunctivae and EOM are normal.  Neck: Normal range of motion. Neck supple. No thyromegaly present.  Cardiovascular: Normal rate, regular rhythm, normal heart sounds and intact distal pulses.  No murmur heard. Pulmonary/Chest: Effort normal and breath sounds normal. No respiratory distress.  Abdominal: Soft. She exhibits no distension. There is no tenderness.  Musculoskeletal: She exhibits tenderness (TTP of R middle finger over PIP joint w/ some nodularity). She exhibits no edema.  TTP over L sciatic notch  Lymphadenopathy:    She has no cervical adenopathy.  Neurological: She is alert and oriented to person, place, and time.  Skin: Skin is warm and dry.  Psychiatric: She has a normal mood and affect. Her behavior is normal.  Vitals reviewed.         Assessment & Plan:  L hip pain- new.  Pt w/ TTP over L sciatic notch.  Start scheduled NSAIDs- since she is on HD she is able to take this w/o fear of kidney damage.  Reviewed supportive care and red flags that should prompt return.  Pt  expressed understanding and is in agreement w/ plan.   R middle finger pain- new.  Pt w/ some nodularity of PIP joint and pain w/ flexion/extension of finger.  Mobic should help w/ pain.  Offered hand specialist appt- pt declined.  Will follow.

## 2017-10-01 NOTE — Assessment & Plan Note (Signed)
Chronic problem.  Currently diet controlled.  Asymptomatic.  UTD on foot exam.  Due for eye exam- referral placed.  Stressed need for healthy diet.  Check labs.  Adjust tx prn

## 2017-10-01 NOTE — Patient Instructions (Signed)
Follow up in 3-4 months to recheck sugar and cholesterol We'll notify you of your lab results and make any changes if needed START the Meloxicam once daily for pain- take w/ food Alternate heat or ice on your hip- whichever feels better If you change your mind about the hand specialist- let me know! We'll call you with your eye appt Call with any questions or concerns YOU LOOK GREAT!!! Have a great summer!!

## 2017-10-01 NOTE — Telephone Encounter (Signed)
CRITICAL VALUE STICKER  CRITICAL VALUE: Creatinine: 6.89 GFR: 7.85  RECEIVER (on-site recipient of call): Toni/Bethany  DATE & TIME NOTIFIED: 2:45pm 10/01/17  MESSENGER (representative from lab): Wynetta Fines  MD NOTIFIED: Birdie Riddle  TIME OF NOTIFICATION:    2:46pm  Routing to Provider a protocol.

## 2017-10-02 ENCOUNTER — Ambulatory Visit (INDEPENDENT_AMBULATORY_CARE_PROVIDER_SITE_OTHER): Payer: Self-pay | Admitting: Physician Assistant

## 2017-10-02 ENCOUNTER — Encounter: Payer: Self-pay | Admitting: Physician Assistant

## 2017-10-02 ENCOUNTER — Other Ambulatory Visit: Payer: Self-pay

## 2017-10-02 VITALS — BP 89/62 | HR 97 | Temp 100.8°F | Resp 16 | Ht 65.0 in | Wt 166.0 lb

## 2017-10-02 DIAGNOSIS — N2581 Secondary hyperparathyroidism of renal origin: Secondary | ICD-10-CM | POA: Diagnosis not present

## 2017-10-02 DIAGNOSIS — Z992 Dependence on renal dialysis: Secondary | ICD-10-CM

## 2017-10-02 DIAGNOSIS — Z23 Encounter for immunization: Secondary | ICD-10-CM | POA: Diagnosis not present

## 2017-10-02 DIAGNOSIS — N186 End stage renal disease: Secondary | ICD-10-CM | POA: Diagnosis not present

## 2017-10-02 DIAGNOSIS — D509 Iron deficiency anemia, unspecified: Secondary | ICD-10-CM | POA: Diagnosis not present

## 2017-10-02 DIAGNOSIS — D631 Anemia in chronic kidney disease: Secondary | ICD-10-CM | POA: Diagnosis not present

## 2017-10-02 DIAGNOSIS — E876 Hypokalemia: Secondary | ICD-10-CM | POA: Diagnosis not present

## 2017-10-02 NOTE — Progress Notes (Signed)
    Postoperative Access Visit   History of Present Illness   [name] [name] is a 62 y.o. year old female who presents for postoperative follow-up for: left second stage basilic vein transposition by Dr. Donzetta Matters (Date: 09/01/17).  The patient's wounds are healed.  The patient denies steal symptoms.  The patient is able to complete their activities of daily living.  The patient is currently dialyzing via R IJ TDC under the care of Dr. Freddrick March.   Physical Examination   Vitals:   10/02/17 1450  BP: (!) 89/62  Pulse: 97  Resp: 16  Temp: (!) 100.8 F (38.2 C)  TempSrc: Oral  SpO2: 93%  Weight: 166 lb (75.3 kg)  Height: 5\' 5"  (1.651 m)   Body mass index is 27.62 kg/m.  left arm Incisions are well healed, hand grip is 5/5, sensation in digits is intact, palpable thrill, bruit can be auscultated; palpable L radial pulse; small, focal aneurysmal area in L AC fossa not tender or bothersome to touch    Medical Decision Making   [name] [name] is a 62 y.o. year old female who presents s/p left second stage basilic vein transposition   The patient's access is ready for use.  The patient's tunneled dialysis catheter can be removed when the patient's Nephrologist is comfortable with the performance of the fistula  She will follow up as needed; she knows to call the office if the area in L AC fossa becomes painful or increases in size   [name] Ligas PA-C [facility] Office: [phone]

## 2017-10-05 DIAGNOSIS — D509 Iron deficiency anemia, unspecified: Secondary | ICD-10-CM | POA: Diagnosis not present

## 2017-10-05 DIAGNOSIS — D631 Anemia in chronic kidney disease: Secondary | ICD-10-CM | POA: Diagnosis not present

## 2017-10-05 DIAGNOSIS — E876 Hypokalemia: Secondary | ICD-10-CM | POA: Diagnosis not present

## 2017-10-05 DIAGNOSIS — N186 End stage renal disease: Secondary | ICD-10-CM | POA: Diagnosis not present

## 2017-10-05 DIAGNOSIS — Z23 Encounter for immunization: Secondary | ICD-10-CM | POA: Diagnosis not present

## 2017-10-05 DIAGNOSIS — N2581 Secondary hyperparathyroidism of renal origin: Secondary | ICD-10-CM | POA: Diagnosis not present

## 2017-10-07 DIAGNOSIS — Z23 Encounter for immunization: Secondary | ICD-10-CM | POA: Diagnosis not present

## 2017-10-07 DIAGNOSIS — D631 Anemia in chronic kidney disease: Secondary | ICD-10-CM | POA: Diagnosis not present

## 2017-10-07 DIAGNOSIS — E876 Hypokalemia: Secondary | ICD-10-CM | POA: Diagnosis not present

## 2017-10-07 DIAGNOSIS — D509 Iron deficiency anemia, unspecified: Secondary | ICD-10-CM | POA: Diagnosis not present

## 2017-10-07 DIAGNOSIS — N2581 Secondary hyperparathyroidism of renal origin: Secondary | ICD-10-CM | POA: Diagnosis not present

## 2017-10-07 DIAGNOSIS — N186 End stage renal disease: Secondary | ICD-10-CM | POA: Diagnosis not present

## 2017-10-09 DIAGNOSIS — N186 End stage renal disease: Secondary | ICD-10-CM | POA: Diagnosis not present

## 2017-10-09 DIAGNOSIS — E876 Hypokalemia: Secondary | ICD-10-CM | POA: Diagnosis not present

## 2017-10-09 DIAGNOSIS — N2581 Secondary hyperparathyroidism of renal origin: Secondary | ICD-10-CM | POA: Diagnosis not present

## 2017-10-09 DIAGNOSIS — D509 Iron deficiency anemia, unspecified: Secondary | ICD-10-CM | POA: Diagnosis not present

## 2017-10-09 DIAGNOSIS — Z23 Encounter for immunization: Secondary | ICD-10-CM | POA: Diagnosis not present

## 2017-10-09 DIAGNOSIS — D631 Anemia in chronic kidney disease: Secondary | ICD-10-CM | POA: Diagnosis not present

## 2017-10-12 DIAGNOSIS — E876 Hypokalemia: Secondary | ICD-10-CM | POA: Diagnosis not present

## 2017-10-12 DIAGNOSIS — N2581 Secondary hyperparathyroidism of renal origin: Secondary | ICD-10-CM | POA: Diagnosis not present

## 2017-10-12 DIAGNOSIS — Z23 Encounter for immunization: Secondary | ICD-10-CM | POA: Diagnosis not present

## 2017-10-12 DIAGNOSIS — D509 Iron deficiency anemia, unspecified: Secondary | ICD-10-CM | POA: Diagnosis not present

## 2017-10-12 DIAGNOSIS — D631 Anemia in chronic kidney disease: Secondary | ICD-10-CM | POA: Diagnosis not present

## 2017-10-12 DIAGNOSIS — N186 End stage renal disease: Secondary | ICD-10-CM | POA: Diagnosis not present

## 2017-10-14 DIAGNOSIS — D509 Iron deficiency anemia, unspecified: Secondary | ICD-10-CM | POA: Diagnosis not present

## 2017-10-14 DIAGNOSIS — E1129 Type 2 diabetes mellitus with other diabetic kidney complication: Secondary | ICD-10-CM | POA: Diagnosis not present

## 2017-10-14 DIAGNOSIS — N186 End stage renal disease: Secondary | ICD-10-CM | POA: Diagnosis not present

## 2017-10-14 DIAGNOSIS — N2581 Secondary hyperparathyroidism of renal origin: Secondary | ICD-10-CM | POA: Diagnosis not present

## 2017-10-14 DIAGNOSIS — Z23 Encounter for immunization: Secondary | ICD-10-CM | POA: Diagnosis not present

## 2017-10-14 DIAGNOSIS — D631 Anemia in chronic kidney disease: Secondary | ICD-10-CM | POA: Diagnosis not present

## 2017-10-14 DIAGNOSIS — E876 Hypokalemia: Secondary | ICD-10-CM | POA: Diagnosis not present

## 2017-10-16 DIAGNOSIS — Z23 Encounter for immunization: Secondary | ICD-10-CM | POA: Diagnosis not present

## 2017-10-16 DIAGNOSIS — D509 Iron deficiency anemia, unspecified: Secondary | ICD-10-CM | POA: Diagnosis not present

## 2017-10-16 DIAGNOSIS — N2581 Secondary hyperparathyroidism of renal origin: Secondary | ICD-10-CM | POA: Diagnosis not present

## 2017-10-16 DIAGNOSIS — N186 End stage renal disease: Secondary | ICD-10-CM | POA: Diagnosis not present

## 2017-10-16 DIAGNOSIS — D631 Anemia in chronic kidney disease: Secondary | ICD-10-CM | POA: Diagnosis not present

## 2017-10-16 DIAGNOSIS — E876 Hypokalemia: Secondary | ICD-10-CM | POA: Diagnosis not present

## 2017-10-19 DIAGNOSIS — D509 Iron deficiency anemia, unspecified: Secondary | ICD-10-CM | POA: Diagnosis not present

## 2017-10-19 DIAGNOSIS — N186 End stage renal disease: Secondary | ICD-10-CM | POA: Diagnosis not present

## 2017-10-19 DIAGNOSIS — E876 Hypokalemia: Secondary | ICD-10-CM | POA: Diagnosis not present

## 2017-10-19 DIAGNOSIS — Z23 Encounter for immunization: Secondary | ICD-10-CM | POA: Diagnosis not present

## 2017-10-19 DIAGNOSIS — N2581 Secondary hyperparathyroidism of renal origin: Secondary | ICD-10-CM | POA: Diagnosis not present

## 2017-10-19 DIAGNOSIS — D631 Anemia in chronic kidney disease: Secondary | ICD-10-CM | POA: Diagnosis not present

## 2017-10-21 DIAGNOSIS — N2581 Secondary hyperparathyroidism of renal origin: Secondary | ICD-10-CM | POA: Diagnosis not present

## 2017-10-21 DIAGNOSIS — N186 End stage renal disease: Secondary | ICD-10-CM | POA: Diagnosis not present

## 2017-10-21 DIAGNOSIS — Z23 Encounter for immunization: Secondary | ICD-10-CM | POA: Diagnosis not present

## 2017-10-21 DIAGNOSIS — D631 Anemia in chronic kidney disease: Secondary | ICD-10-CM | POA: Diagnosis not present

## 2017-10-21 DIAGNOSIS — D509 Iron deficiency anemia, unspecified: Secondary | ICD-10-CM | POA: Diagnosis not present

## 2017-10-21 DIAGNOSIS — E876 Hypokalemia: Secondary | ICD-10-CM | POA: Diagnosis not present

## 2017-10-22 DIAGNOSIS — Z992 Dependence on renal dialysis: Secondary | ICD-10-CM | POA: Diagnosis not present

## 2017-10-22 DIAGNOSIS — N186 End stage renal disease: Secondary | ICD-10-CM | POA: Diagnosis not present

## 2017-10-22 DIAGNOSIS — I150 Renovascular hypertension: Secondary | ICD-10-CM | POA: Diagnosis not present

## 2017-10-23 DIAGNOSIS — N186 End stage renal disease: Secondary | ICD-10-CM | POA: Diagnosis not present

## 2017-10-23 DIAGNOSIS — N2581 Secondary hyperparathyroidism of renal origin: Secondary | ICD-10-CM | POA: Diagnosis not present

## 2017-10-23 DIAGNOSIS — D509 Iron deficiency anemia, unspecified: Secondary | ICD-10-CM | POA: Diagnosis not present

## 2017-10-26 DIAGNOSIS — N186 End stage renal disease: Secondary | ICD-10-CM | POA: Diagnosis not present

## 2017-10-26 DIAGNOSIS — D509 Iron deficiency anemia, unspecified: Secondary | ICD-10-CM | POA: Diagnosis not present

## 2017-10-26 DIAGNOSIS — N2581 Secondary hyperparathyroidism of renal origin: Secondary | ICD-10-CM | POA: Diagnosis not present

## 2017-10-27 ENCOUNTER — Other Ambulatory Visit: Payer: Self-pay | Admitting: Family Medicine

## 2017-10-28 DIAGNOSIS — D509 Iron deficiency anemia, unspecified: Secondary | ICD-10-CM | POA: Diagnosis not present

## 2017-10-28 DIAGNOSIS — N2581 Secondary hyperparathyroidism of renal origin: Secondary | ICD-10-CM | POA: Diagnosis not present

## 2017-10-28 DIAGNOSIS — N186 End stage renal disease: Secondary | ICD-10-CM | POA: Diagnosis not present

## 2017-10-30 DIAGNOSIS — N186 End stage renal disease: Secondary | ICD-10-CM | POA: Diagnosis not present

## 2017-10-30 DIAGNOSIS — D509 Iron deficiency anemia, unspecified: Secondary | ICD-10-CM | POA: Diagnosis not present

## 2017-10-30 DIAGNOSIS — N2581 Secondary hyperparathyroidism of renal origin: Secondary | ICD-10-CM | POA: Diagnosis not present

## 2017-11-02 DIAGNOSIS — N186 End stage renal disease: Secondary | ICD-10-CM | POA: Diagnosis not present

## 2017-11-02 DIAGNOSIS — N2581 Secondary hyperparathyroidism of renal origin: Secondary | ICD-10-CM | POA: Diagnosis not present

## 2017-11-02 DIAGNOSIS — D509 Iron deficiency anemia, unspecified: Secondary | ICD-10-CM | POA: Diagnosis not present

## 2017-11-04 DIAGNOSIS — N186 End stage renal disease: Secondary | ICD-10-CM | POA: Diagnosis not present

## 2017-11-04 DIAGNOSIS — D509 Iron deficiency anemia, unspecified: Secondary | ICD-10-CM | POA: Diagnosis not present

## 2017-11-04 DIAGNOSIS — N2581 Secondary hyperparathyroidism of renal origin: Secondary | ICD-10-CM | POA: Diagnosis not present

## 2017-11-06 ENCOUNTER — Ambulatory Visit: Payer: Self-pay

## 2017-11-06 DIAGNOSIS — N186 End stage renal disease: Secondary | ICD-10-CM | POA: Diagnosis not present

## 2017-11-06 DIAGNOSIS — N2581 Secondary hyperparathyroidism of renal origin: Secondary | ICD-10-CM | POA: Diagnosis not present

## 2017-11-06 DIAGNOSIS — D509 Iron deficiency anemia, unspecified: Secondary | ICD-10-CM | POA: Diagnosis not present

## 2017-11-06 NOTE — Telephone Encounter (Signed)
Phone call returned to pt.  Reported she has had an area of numbness in both feet for a long time; estimated the symptom has been going on for "several months."  Described the numbness mainly affecting the area beneath her toes, across the upper pad of the foot. Stated she was at HD today, and the Dialysis nurse checked the pulses in her feet, and checked for any open sores.  Pt. stated she was informed her feet looked okay, but advised her to report her symptoms to the PCP.  Stated the area of numbness is present all the time.  Reported able to wiggle the toes and move her feet without difficulty.  Stated her feet "feel cold" at times. Denied any color change in feet.  Denied any neurological deficit; see assess. below.  Does c/o occas. dizziness. Recommended to f/u with PCP within 3 days.  Pt. stated she is limited to transportation on Thursdays, based on her son's schedule.  Appt. given at next available Thurs. Opening for Dr. Birdie Riddle, on 11/19/17 @ 10:00 AM.  Care advice given per protocol.  Will send note to Dr. Birdie Riddle for review, and advisement if pt. needs to be seen sooner.  Encouraged pt. not to go barefoot, and to check her feet daily for injury.  Verb. understanding.  Agrees with plan.       Reason for Disposition . [1] Numbness or tingling in one or both feet AND [2] is a chronic symptom (recurrent or ongoing AND present > 4 weeks)  Answer Assessment - Initial Assessment Questions 1. SYMPTOM: "What is the main symptom you are concerned about?" (e.g., weakness, numbness)     Numbness in both feet 2. ONSET: "When did this start?" (minutes, hours, days; while sleeping)     Several months ago. 3. LAST NORMAL: "When was the last time you were normal (no symptoms)?"     Unsure  4. PATTERN "Does this come and go, or has it been constant since it started?"  "Is it present now?"     Steady numbness in both feet; across upper pad of feet, beneath toes, 5. CARDIAC SYMPTOMS: "Have you had any of the  following symptoms: chest pain, difficulty breathing, palpitations?"     Denied chest pain, palpitations, or shortness of breath 6. NEUROLOGIC SYMPTOMS: "Have you had any of the following symptoms: headache, dizziness, vision loss, double vision, changes in speech, unsteady on your feet?"     Feels numbness across upper sole of feet, in proximity to toes; denied unilateral weakness of extremities, denied temporary loss of vision/ blurred vision, speech change. C/o occasional dizziness      7. OTHER SYMPTOMS: "Do you have any other symptoms?"     Has a cold sensation of her toes; denied color change.  Able to wiggle her toes and feet 8. PREGNANCY: "Is there any chance you are pregnant?" "When was your last menstrual period?"     N/a  Protocols used: NEUROLOGIC DEFICIT-A-AH

## 2017-11-09 DIAGNOSIS — D509 Iron deficiency anemia, unspecified: Secondary | ICD-10-CM | POA: Diagnosis not present

## 2017-11-09 DIAGNOSIS — N186 End stage renal disease: Secondary | ICD-10-CM | POA: Diagnosis not present

## 2017-11-09 DIAGNOSIS — N2581 Secondary hyperparathyroidism of renal origin: Secondary | ICD-10-CM | POA: Diagnosis not present

## 2017-11-11 DIAGNOSIS — N186 End stage renal disease: Secondary | ICD-10-CM | POA: Diagnosis not present

## 2017-11-11 DIAGNOSIS — D509 Iron deficiency anemia, unspecified: Secondary | ICD-10-CM | POA: Diagnosis not present

## 2017-11-11 DIAGNOSIS — N2581 Secondary hyperparathyroidism of renal origin: Secondary | ICD-10-CM | POA: Diagnosis not present

## 2017-11-13 DIAGNOSIS — D509 Iron deficiency anemia, unspecified: Secondary | ICD-10-CM | POA: Diagnosis not present

## 2017-11-13 DIAGNOSIS — N2581 Secondary hyperparathyroidism of renal origin: Secondary | ICD-10-CM | POA: Diagnosis not present

## 2017-11-13 DIAGNOSIS — N186 End stage renal disease: Secondary | ICD-10-CM | POA: Diagnosis not present

## 2017-11-16 DIAGNOSIS — N186 End stage renal disease: Secondary | ICD-10-CM | POA: Diagnosis not present

## 2017-11-16 DIAGNOSIS — N2581 Secondary hyperparathyroidism of renal origin: Secondary | ICD-10-CM | POA: Diagnosis not present

## 2017-11-16 DIAGNOSIS — D509 Iron deficiency anemia, unspecified: Secondary | ICD-10-CM | POA: Diagnosis not present

## 2017-11-18 DIAGNOSIS — N2581 Secondary hyperparathyroidism of renal origin: Secondary | ICD-10-CM | POA: Diagnosis not present

## 2017-11-18 DIAGNOSIS — D509 Iron deficiency anemia, unspecified: Secondary | ICD-10-CM | POA: Diagnosis not present

## 2017-11-18 DIAGNOSIS — N186 End stage renal disease: Secondary | ICD-10-CM | POA: Diagnosis not present

## 2017-11-19 ENCOUNTER — Ambulatory Visit: Payer: Self-pay | Admitting: Family Medicine

## 2017-11-20 DIAGNOSIS — D509 Iron deficiency anemia, unspecified: Secondary | ICD-10-CM | POA: Diagnosis not present

## 2017-11-20 DIAGNOSIS — N2581 Secondary hyperparathyroidism of renal origin: Secondary | ICD-10-CM | POA: Diagnosis not present

## 2017-11-20 DIAGNOSIS — N186 End stage renal disease: Secondary | ICD-10-CM | POA: Diagnosis not present

## 2017-11-22 DIAGNOSIS — Z992 Dependence on renal dialysis: Secondary | ICD-10-CM | POA: Diagnosis not present

## 2017-11-22 DIAGNOSIS — I150 Renovascular hypertension: Secondary | ICD-10-CM | POA: Diagnosis not present

## 2017-11-22 DIAGNOSIS — N186 End stage renal disease: Secondary | ICD-10-CM | POA: Diagnosis not present

## 2017-11-23 DIAGNOSIS — N186 End stage renal disease: Secondary | ICD-10-CM | POA: Diagnosis not present

## 2017-11-23 DIAGNOSIS — D631 Anemia in chronic kidney disease: Secondary | ICD-10-CM | POA: Diagnosis not present

## 2017-11-23 DIAGNOSIS — N2581 Secondary hyperparathyroidism of renal origin: Secondary | ICD-10-CM | POA: Diagnosis not present

## 2017-11-25 DIAGNOSIS — N2581 Secondary hyperparathyroidism of renal origin: Secondary | ICD-10-CM | POA: Diagnosis not present

## 2017-11-25 DIAGNOSIS — D631 Anemia in chronic kidney disease: Secondary | ICD-10-CM | POA: Diagnosis not present

## 2017-11-25 DIAGNOSIS — N186 End stage renal disease: Secondary | ICD-10-CM | POA: Diagnosis not present

## 2017-11-27 DIAGNOSIS — D631 Anemia in chronic kidney disease: Secondary | ICD-10-CM | POA: Diagnosis not present

## 2017-11-27 DIAGNOSIS — N186 End stage renal disease: Secondary | ICD-10-CM | POA: Diagnosis not present

## 2017-11-27 DIAGNOSIS — N2581 Secondary hyperparathyroidism of renal origin: Secondary | ICD-10-CM | POA: Diagnosis not present

## 2017-11-30 DIAGNOSIS — N2581 Secondary hyperparathyroidism of renal origin: Secondary | ICD-10-CM | POA: Diagnosis not present

## 2017-11-30 DIAGNOSIS — N186 End stage renal disease: Secondary | ICD-10-CM | POA: Diagnosis not present

## 2017-11-30 DIAGNOSIS — D631 Anemia in chronic kidney disease: Secondary | ICD-10-CM | POA: Diagnosis not present

## 2017-12-02 DIAGNOSIS — N186 End stage renal disease: Secondary | ICD-10-CM | POA: Diagnosis not present

## 2017-12-02 DIAGNOSIS — N2581 Secondary hyperparathyroidism of renal origin: Secondary | ICD-10-CM | POA: Diagnosis not present

## 2017-12-02 DIAGNOSIS — D631 Anemia in chronic kidney disease: Secondary | ICD-10-CM | POA: Diagnosis not present

## 2017-12-04 DIAGNOSIS — D631 Anemia in chronic kidney disease: Secondary | ICD-10-CM | POA: Diagnosis not present

## 2017-12-04 DIAGNOSIS — N2581 Secondary hyperparathyroidism of renal origin: Secondary | ICD-10-CM | POA: Diagnosis not present

## 2017-12-04 DIAGNOSIS — N186 End stage renal disease: Secondary | ICD-10-CM | POA: Diagnosis not present

## 2017-12-07 DIAGNOSIS — N2581 Secondary hyperparathyroidism of renal origin: Secondary | ICD-10-CM | POA: Diagnosis not present

## 2017-12-07 DIAGNOSIS — N186 End stage renal disease: Secondary | ICD-10-CM | POA: Diagnosis not present

## 2017-12-07 DIAGNOSIS — D631 Anemia in chronic kidney disease: Secondary | ICD-10-CM | POA: Diagnosis not present

## 2017-12-09 DIAGNOSIS — N2581 Secondary hyperparathyroidism of renal origin: Secondary | ICD-10-CM | POA: Diagnosis not present

## 2017-12-09 DIAGNOSIS — D631 Anemia in chronic kidney disease: Secondary | ICD-10-CM | POA: Diagnosis not present

## 2017-12-09 DIAGNOSIS — N186 End stage renal disease: Secondary | ICD-10-CM | POA: Diagnosis not present

## 2017-12-11 DIAGNOSIS — N186 End stage renal disease: Secondary | ICD-10-CM | POA: Diagnosis not present

## 2017-12-11 DIAGNOSIS — D631 Anemia in chronic kidney disease: Secondary | ICD-10-CM | POA: Diagnosis not present

## 2017-12-11 DIAGNOSIS — N2581 Secondary hyperparathyroidism of renal origin: Secondary | ICD-10-CM | POA: Diagnosis not present

## 2017-12-14 ENCOUNTER — Other Ambulatory Visit: Payer: Self-pay | Admitting: Family Medicine

## 2017-12-14 DIAGNOSIS — N2581 Secondary hyperparathyroidism of renal origin: Secondary | ICD-10-CM | POA: Diagnosis not present

## 2017-12-14 DIAGNOSIS — D631 Anemia in chronic kidney disease: Secondary | ICD-10-CM | POA: Diagnosis not present

## 2017-12-14 DIAGNOSIS — N186 End stage renal disease: Secondary | ICD-10-CM | POA: Diagnosis not present

## 2017-12-15 ENCOUNTER — Other Ambulatory Visit: Payer: Self-pay | Admitting: Family Medicine

## 2017-12-16 ENCOUNTER — Encounter (HOSPITAL_COMMUNITY): Payer: Self-pay | Admitting: Emergency Medicine

## 2017-12-16 ENCOUNTER — Emergency Department (HOSPITAL_BASED_OUTPATIENT_CLINIC_OR_DEPARTMENT_OTHER): Payer: Medicare Other

## 2017-12-16 ENCOUNTER — Emergency Department (HOSPITAL_COMMUNITY)
Admission: EM | Admit: 2017-12-16 | Discharge: 2017-12-16 | Disposition: A | Discharge status: Home / Self Care | Payer: Medicare Other | Attending: Emergency Medicine | Admitting: Emergency Medicine

## 2017-12-16 DIAGNOSIS — T148XXA Other injury of unspecified body region, initial encounter: Secondary | ICD-10-CM

## 2017-12-16 DIAGNOSIS — M7981 Nontraumatic hematoma of soft tissue: Secondary | ICD-10-CM | POA: Insufficient documentation

## 2017-12-16 DIAGNOSIS — N189 Chronic kidney disease, unspecified: Secondary | ICD-10-CM | POA: Diagnosis not present

## 2017-12-16 DIAGNOSIS — I130 Hypertensive heart and chronic kidney disease with heart failure and stage 1 through stage 4 chronic kidney disease, or unspecified chronic kidney disease: Secondary | ICD-10-CM | POA: Diagnosis not present

## 2017-12-16 DIAGNOSIS — D631 Anemia in chronic kidney disease: Secondary | ICD-10-CM | POA: Diagnosis not present

## 2017-12-16 DIAGNOSIS — Z79899 Other long term (current) drug therapy: Secondary | ICD-10-CM | POA: Diagnosis not present

## 2017-12-16 DIAGNOSIS — I5032 Chronic diastolic (congestive) heart failure: Secondary | ICD-10-CM | POA: Diagnosis not present

## 2017-12-16 DIAGNOSIS — N2581 Secondary hyperparathyroidism of renal origin: Secondary | ICD-10-CM | POA: Diagnosis not present

## 2017-12-16 DIAGNOSIS — Z992 Dependence on renal dialysis: Secondary | ICD-10-CM

## 2017-12-16 DIAGNOSIS — E119 Type 2 diabetes mellitus without complications: Secondary | ICD-10-CM | POA: Diagnosis not present

## 2017-12-16 DIAGNOSIS — Z9889 Other specified postprocedural states: Secondary | ICD-10-CM

## 2017-12-16 DIAGNOSIS — T82838A Hemorrhage of vascular prosthetic devices, implants and grafts, initial encounter: Secondary | ICD-10-CM | POA: Diagnosis not present

## 2017-12-16 DIAGNOSIS — N186 End stage renal disease: Secondary | ICD-10-CM | POA: Diagnosis not present

## 2017-12-16 DIAGNOSIS — I120 Hypertensive chronic kidney disease with stage 5 chronic kidney disease or end stage renal disease: Secondary | ICD-10-CM | POA: Diagnosis not present

## 2017-12-16 LAB — CBC
HEMATOCRIT: 36.8 % (ref 36.0–46.0)
HEMOGLOBIN: 12 g/dL (ref 12.0–15.0)
MCH: 33.1 pg (ref 26.0–34.0)
MCHC: 32.6 g/dL (ref 30.0–36.0)
MCV: 101.4 fL — AB (ref 78.0–100.0)
Platelets: 184 10*3/uL (ref 150–400)
RBC: 3.63 MIL/uL — AB (ref 3.87–5.11)
RDW: 13.4 % (ref 11.5–15.5)
WBC: 8.8 10*3/uL (ref 4.0–10.5)

## 2017-12-16 LAB — BASIC METABOLIC PANEL WITH GFR
Anion gap: 16 — ABNORMAL HIGH (ref 5–15)
BUN: 16 mg/dL (ref 6–20)
CO2: 29 mmol/L (ref 22–32)
Calcium: 9 mg/dL (ref 8.9–10.3)
Chloride: 93 mmol/L — ABNORMAL LOW (ref 98–111)
Creatinine, Ser: 5.79 mg/dL — ABNORMAL HIGH (ref 0.44–1.00)
GFR calc Af Amer: 8 mL/min — ABNORMAL LOW
GFR calc non Af Amer: 7 mL/min — ABNORMAL LOW
Glucose, Bld: 106 mg/dL — ABNORMAL HIGH (ref 70–99)
Potassium: 3.2 mmol/L — ABNORMAL LOW (ref 3.5–5.1)
Sodium: 138 mmol/L (ref 135–145)

## 2017-12-16 NOTE — Progress Notes (Signed)
Preliminary results by tech - Left upper ext. Arterial duplex limited completed. Negative for AVF pseudoaneurysm. A hematoma was noted.  Oda Cogan, BS, RDMS, RVT

## 2017-12-16 NOTE — Discharge Instructions (Addendum)
Apply ice to the affected area to help with swelling.  Additionally, compression can help with reducing the hematoma.  Please follow-up with dialysis as directed.  Return the emergency department for any worsening swelling, redness or warmth of the area, bleeding that will not stop or any other worsening or concerning symptoms.

## 2017-12-16 NOTE — Consult Note (Addendum)
Hospital Consult    Reason for Consult:  Hematoma L arm Requesting Physician:  ED MRN #:  [id number]  History of Present Illness: This is a 61 y.o. female with past medical history significant for end-stage renal disease requiring hemodialysis.  She is seen in consultation due to a hematoma adjacent to her left arm AV fistula.  Surgical history significant for transposed brachiobasilic fistula by Dr. Donzetta Matters 08/2017.  About 2 weeks ago patient states her fistula was infiltrated during hemodialysis.  She has been dialyzing on her usual schedule via her right IJ tunneled dialysis catheter and allowing hematoma of left arm to rest.  She states that the skin overlying her hematoma has progressively thinned and she began bleeding this past Monday which was controlled with a dressing.  She has had some off-and-on bleeding since Monday and presented to the emergency department today with continued bleeding.  ED work-up included a arterial duplex which was negative for pseudoaneurysm, suggesting simple hematoma.  Past Medical History:  Diagnosis Date  . Adenomatous colon polyp 04/1998  . Anemia   . Anxiety   . Arthritis   . Carotid artery disease ([place])    Carotid US 1/18: bilateral ICA 1-39, R thyroid lobe nodule (1.9x2.2x3cm); numerous L thyroid lobe nodules - repeat 1 year  . Chronic kidney disease    M/W/[facility]  . Chronic renal failure    post transplant  . Depression   . Diabetes mellitus   . EBV infection    Per [facility]  . Encephalitis    NMDA per [facility]  . Esophagitis    Grade 1 Distal  . Focal seizure ([facility])    .  Last one 06/2016  . GERD (gastroesophageal reflux disease)   . Hemorrhoids   . History of kidney stones    Passed  . History of subdural hematoma   . Hx of cardiovascular stress test    Lexiscan Myoview (06/2013):  No ischemia, EF 66%; normal.  //  Myoview 12/17: EF 62, no ischemia or scar; Normal  . Hx of echocardiogram    a. Echocardiogram (06/2013):  Mod focal basal  hypertrophy, EF 60-65%, normal wall motion, Gr 1 DD, mild AI, mildly dilated ascending aorta (41 mm), mild LAE.; b.  Echo 9/16: mod LVH, EF 60-65%, no RWMA, Gr 1 DD, trivial AI, mild dilated ascending aorta, mild LAE  . Hyperkalemia   . Hyperlipidemia   . Hypertension   . Hypomagnesemia   . Left jugular vein thrombosis    Partial resolved per [facility]  . Metabolic acidosis   . Pneumonia   . Right jugular vein thrombosis    resolved from [facility]  . Seizures ([place])     Past Surgical History:  Procedure Laterality Date  . A/V FISTULAGRAM Left 06/04/2017   Procedure: A/V FISTULAGRAM;  Surgeon: Conrad Stokes, MD;  Location: [facility];  Service: Cardiovascular;  Laterality: Left;  . ABDOMINAL HYSTERECTOMY    . AV FISTULA PLACEMENT  07/04/2005   [place] AV fistula  . AV FISTULA PLACEMENT  08/27/2005  . AV FISTULA PLACEMENT Left 04/29/2017   Procedure: Creation of Left Arm ARTERIOVENOUS BRACHIOCEPHALIC FISTULA;  Surgeon: Elam Dutch, MD;  Location: [place];  Service: Vascular;  Laterality: Left;  . AV FISTULA PLACEMENT Left 06/16/2017   Procedure: BRACHIO_BASCILIC ARTERIOVENOUS (AV) FISTULA CREATION;  Surgeon: Waynetta Sandy, MD;  Location: [place];  Service: Vascular;  Laterality: Left;  . AV FISTULA PLACEMENT W/ PTFE  08/27/2005  .  BASCILIC VEIN TRANSPOSITION Left 06/16/2017   Procedure: BRACHIOCEPHALIC TRANSPOSITION  LEFT ARM;  Surgeon: Waynetta Sandy, MD;  Location: [place];  Service: Vascular;  Laterality: Left;  . BASCILIC VEIN TRANSPOSITION Left 09/01/2017   Procedure: BASILIC VEIN TRANSPOSITION SECOND STAGE;  Surgeon: Waynetta Sandy, MD;  Location: [place];  Service: Vascular;  Laterality: Left;  . BRAIN BIOPSY    . CESAREAN SECTION    . DG AV DIALYSIS GRAFT DECLOT OR  07/24/2005   AV Gore-Tex graf  . DG AV DIALYSIS GRAFT DECLOT OR  Thrombosis right forearm, loop arteriovenous   Thrombosis right forearm, loop arteriovenous graft  . GASTROSTOMY TUBE  PLACEMENT    . KIDNEY TRANSPLANT  2009   Both  . REMOVAL OF GASTROSTOMY TUBE    . THROMBECTOMY / ARTERIOVENOUS GRAFT REVISION  10/12/2006  . THROMBECTOMY / ARTERIOVENOUS GRAFT REVISION  10/16/2006    Allergies  Allergen Reactions  . Lisinopril Shortness Of Breath, Swelling and Other (See Comments)    Throat irritation also. Patient takes losartan and tolerates fine.  [name] [Oxaprozin] Hives and Dermatitis    Prior to Admission medications   Medication Sig Start Date End Date Taking? Authorizing Provider  acetaminophen (TYLENOL) 650 MG CR tablet Take 650-1,300 mg by mouth every 8 (eight) hours as needed for pain.    Yes [provider]  atorvastatin (LIPITOR) 40 MG tablet Take 1 tablet (40 mg total) by mouth daily. 07/03/17  Yes Midge Minium, MD  cetirizine (ZYRTEC) 10 MG tablet Take 1 tablet (10 mg total) by mouth daily. 05/07/17  Yes Midge Minium, MD  clonazePAM (KLONOPIN) 0.5 MG tablet TAKE 1 TABLET (0.5 MG TOTAL) BY MOUTH AT BEDTIME. 09/17/17  Yes Midge Minium, MD  cloNIDine (CATAPRES) 0.1 MG tablet TAKE 1 TABLET (0.1 MG TOTAL) BY MOUTH 2 (TWO) TIMES DAILY. 12/14/17  Yes Midge Minium, MD  ferric citrate (AURYXIA) 1 GM 210 MG(Fe) tablet Take 210 mg by mouth See admin instructions. Take 210 mg by mouth three times a day with meals and 210 mg two times a day with snacks   Yes [provider]  fluticasone (FLONASE) 50 MCG/ACT nasal spray Place 2 sprays into both nostrils daily as needed for allergies or rhinitis.   Yes [provider]  isosorbide dinitrate (ISORDIL) 20 MG tablet Take 20 mg by mouth 3 (three) times daily. 07/26/17  Yes [provider]  lidocaine-prilocaine (EMLA) cream Apply 1 application topically as directed. Apply small amount to access site (AVF) 1 to 2 hrs before Dialysis. Cover with occlusive dressing. 10/07/17  Yes [provider]  meloxicam (MOBIC) 15 MG tablet Take 1 tablet (15 mg total) by mouth  daily. 10/01/17  Yes Midge Minium, MD  multivitamin (RENA-VIT) TABS tablet Take 1 tablet by mouth every morning.  03/20/17  Yes [provider]  omeprazole (PRILOSEC) 40 MG capsule TAKE 1 CAPSULE BY MOUTH EVERY DAY 12/14/17  Yes Midge Minium, MD  oxyCODONE-acetaminophen (PERCOCET) 5-325 MG tablet Take 1 tablet by mouth every 6 (six) hours as needed for severe pain. 09/01/17  Yes Rhyne, Hulen Shouts, PA-C  PROAIR HFA 108 (90 Base) MCG/ACT inhaler Inhale 2 puffs into the lungs every 4 (four) hours as needed for wheezing or shortness of breath.  02/18/17  Yes [provider]  QUEtiapine (SEROQUEL) 100 MG tablet TAKE 1 TABLET (100 MG TOTAL) BY MOUTH 3 (THREE) TIMES DAILY. 10/27/17  Yes Midge Minium, MD  traZODone ([place])  50 MG tablet TAKE 0.5-1 TABLETS (25-50 MG TOTAL) BY MOUTH AT BEDTIME AS NEEDED FOR SLEEP. 12/16/17  Yes Midge Minium, MD    Social History   Socioeconomic History  . Marital status: Widowed    Spouse name: Not on file  . Number of children: 2  . Years of education: 6  . Highest education level: Not on file  Occupational History  . Occupation: Investment banker, operational   Social Needs  . Financial resource strain: Not on file  . Food insecurity:    Worry: Not on file    Inability: Not on file  . Transportation needs:    Medical: Not on file    Non-medical: Not on file  Tobacco Use  . Smoking status: Never Smoker  . Smokeless tobacco: Never Used  Substance and Sexual Activity  . Alcohol use: No  . Drug use: No  . Sexual activity: Never  Lifestyle  . Physical activity:    Days per week: Not on file    Minutes per session: Not on file  . Stress: Not on file  Relationships  . Social connections:    Talks on phone: Not on file    Gets together: Not on file    Attends religious service: Not on file    Active member of club or organization: Not on file    Attends meetings of clubs or organizations: Not on file    Relationship status: Not  on file  . Intimate partner violence:    Fear of current or ex partner: Not on file    Emotionally abused: Not on file    Physically abused: Not on file    Forced sexual activity: Not on file  Other Topics Concern  . Not on file  Social History Narrative   Denies caffeine use      Family History  Problem Relation Age of Onset  . Kidney disease Paternal Aunt   . Heart disease Mother   . Heart disease Father   . Colon cancer Neg Hx     ROS: Otherwise negative unless mentioned in HPI  Physical Examination  Vitals:   12/16/17 1400 12/16/17 1530  BP: 128/89 (!) 153/92  Pulse: 92 77  Resp: 18   Temp:    SpO2: 98% 99%   There is no height or weight on file to calculate BMI.  General:  WDWN in NAD Gait: Not observed HENT: WNL, normocephalic Pulmonary: normal non-labored breathing Cardiac: regular Abdomen: soft Skin: without rashes Vascular Exam/Pulses: Palpable left radial pulse Extremities: Palpable thrill and audible bruit over the course of her left arm transposed brachiobasilic fistula; well-defined focal hematoma with fluctuance; thinning skin medial border; no active bleeding; soreness to palpation Musculoskeletal: no muscle wasting or atrophy  Neurologic: A&O X 3;  No focal weakness or paresthesias are detected; speech is fluent/normal Psychiatric:  The pt has Normal affect. Lymph:  Unremarkable  CBC    Component Value Date/Time   WBC 8.8 12/16/2017 1206   RBC 3.63 (L) 12/16/2017 1206   HGB 12.0 12/16/2017 1206   HCT 36.8 12/16/2017 1206   PLT 184 12/16/2017 1206   MCV 101.4 (H) 12/16/2017 1206   MCH 33.1 12/16/2017 1206   MCHC 32.6 12/16/2017 1206   RDW 13.4 12/16/2017 1206   LYMPHSABS 2.5 06/25/2017 1018   MONOABS 1.2 (H) 06/25/2017 1018   EOSABS 0.4 06/25/2017 1018   BASOSABS 0.1 06/25/2017 1018    BMET    Component Value Date/Time   NA  138 12/16/2017 1206   K 3.2 (L) 12/16/2017 1206   CL 93 (L) 12/16/2017 1206   CO2 29 12/16/2017 1206    GLUCOSE 106 (H) 12/16/2017 1206   BUN 16 12/16/2017 1206   CREATININE 5.79 (H) 12/16/2017 1206   CREATININE 4.46 (H) 03/26/2017 1627   CALCIUM 9.0 12/16/2017 1206   GFRNONAA 7 (L) 12/16/2017 1206   GFRAA 8 (L) 12/16/2017 1206    COAGS: Lab Results  Component Value Date   INR 1.08 06/11/2017   INR 1.0 06/11/2017   INR 1.02 04/29/2017     Non-Invasive Vascular Imaging:   Negative for pseudoaneurysm Consistent with hematoma by duplex   ASSESSMENT/PLAN: This is a 62 y.o. female with bleeding from hematoma adjacent to left arm AV fistula  -Duplex negative for any ongoing communication with AV fistula -Options for treatment were percutaneous drainage at the bedside versus incision and drainage in operating room setting -Dr. Donnetta Hutching feared if hematoma was incised and drained this may result in eventual loss of access  -Dr. Donnetta Hutching elected to drain hematoma percutaneously at the bedside; 10 cc of dark red blood was removed -Site will be wrapped with Kerlix and a light Ace bandage -She has a follow-up appointment with Dr. Donzetta Matters in office on Friday 12/18/2017 -Continue hemodialysis from right IJ tunneled dialysis catheter -Ok for discharge home from vascular surgery standpoint  Dagoberto Ligas PA-C [facility] [phone]   I have examined the patient, reviewed and agree with above.  Curt Jews, MD 12/16/2017 5:38 PM

## 2017-12-16 NOTE — ED Provider Notes (Signed)
[facility] EMERGENCY DEPARTMENT Provider Note   CSN: [id number] Arrival date & time: 12/16/17  1140     History   Chief Complaint Chief Complaint  Patient presents with  . Vascular Access Problem    HPI [name] [name] [name] is [name] [name] 62 y.o. female past medical history of anemia, CAD, chronic kidney disease (Monday, Wednesday, Friday dialysis patient) who presents for evaluation of fistula problem.  Patient reports that dialysis center sent her over to the ED for evaluation of her fistula.  Patient unsure if they told her she had an embolism or pseudoaneurysm of her fistula but was told to go to the emergency department.  She did complete dialysis today using access to her port.  Patient reports that about 2 weeks ago, she while getting dialysis, she bit her arm in the IV infiltrated.  Patient reports that she had some mild swelling noted to the area afterwards.  She states that she is swelling continue to persist to the left upper extremity.  She reports last night, the fistula started bleeding.  She went to dialysis this morning and it was bleeding again at dialysis.  They were unable to use it for dialysis and instead had to access her port.  Patient states that she was told either she had an embolism or [name] [name] pseudoaneurysm and was told to go the ED for further evaluation.  Patient states that she has had pain and swelling to left upper extremity since that area of infiltration.  Patient denies any chest pain, difficulty breathing, fevers.  The history is provided by the patient.    Past Medical History:  Diagnosis Date  . Adenomatous colon polyp 04/1998  . Anemia   . Anxiety   . Arthritis   . Carotid artery disease ([place])    Carotid US 1/18: bilateral ICA 1-39, R thyroid lobe nodule (1.9x2.2x3cm); numerous L thyroid lobe nodules - repeat 1 year  . Chronic kidney disease    M/W/[facility]  . Chronic renal failure    post transplant  . Depression   . Diabetes mellitus     . EBV infection    Per AQT  . Encephalitis    NMDA per [facility]  . Esophagitis    Grade 1 Distal  . Focal seizure (H[facility])    .  Last one 06/2016  . GERD (gastroesophageal reflux disease)   . Hemorrhoids   . History of kidney stones    Passed  . History of subdural hematoma   . Hx of cardiovascular stress test    Lexiscan Myoview (06/2013):  No ischemia, EF 66%; normal.  //  Myoview 12/17: EF 62, no ischemia or scar; Normal  . Hx of echocardiogram    [name] [name]. Echocardiogram (06/2013):  Mod focal basal hypertrophy, EF 60-65%, normal wall motion, Gr 1 DD, mild AI, mildly dilated ascending aorta (41 mm), mild LAE.; b.  Echo 9/16: mod LVH, EF 60-65%, no RWMA, Gr 1 DD, trivial AI, mild dilated ascending aorta, mild LAE  . Hyperkalemia   . Hyperlipidemia   . Hypertension   . Hypomagnesemia   . Left jugular vein thrombosis    Partial resolved per [facility]  . Metabolic acidosis   . Pneumonia   . Right jugular vein thrombosis    resolved from [facility]  . Seizures [facility])     Patient Active Problem List   Diagnosis Date Noted  . Watery eyes 05/07/2017  . History of internal jugular thrombosis 03/26/2017  . 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 Chronic diastolic CHF (congestive heart failure) ([place]) 07/08/2016  . Anemia 07/08/2016  . Abnormal brain MRI 07/03/2016  . Fasciculation 06/18/2016  . Carotid artery disease ([place])   . Physical exam 01/28/2016  . Hyperlipidemia 06/28/2015  . Diabetes mellitus type II, controlled ([place]) 06/28/2015  . Grief 06/28/2015  . Thoracic ascending aortic aneurysm (41 mm on Echo 06/2013) 07/06/2013  . ESRD on dialysis ([place]) 06/16/2013  . History of renal transplant 06/16/2013  . GERD (gastroesophageal reflux disease) 08/13/2009  . OTH&UNSPEC NONINFECTIOUS GASTROENTERITIS&COLITIS 08/13/2009  . Essential hypertension 04/25/2009  . HEMORRHOIDS-INTERNAL 04/25/2009  . WEIGHT LOSS 04/25/2009  . NAUSEA AND VOMITING 04/25/2009  . NAUSEA ALONE  04/25/2009  . PERSONAL HX COLONIC POLYPS 04/25/2009    Past Surgical History:  Procedure Laterality Date  . [name] [name]/V FISTULAGRAM Left 06/04/2017   Procedure: [name] [name]/V FISTULAGRAM;  Surgeon: Conrad [place], MD;  Location: [facility] CV LAB;  Service: Cardiovascular;  Laterality: Left;  . ABDOMINAL HYSTERECTOMY    . AV FISTULA PLACEMENT  07/04/2005   [place] AV fistula  . AV FISTULA PLACEMENT  08/27/2005  . AV FISTULA PLACEMENT Left 04/29/2017   Procedure: Creation of Left Arm ARTERIOVENOUS BRACHIOCEPHALIC FISTULA;  Surgeon: Elam Dutch, MD;  Location: [facility];  Service: Vascular;  Laterality: Left;  . AV FISTULA PLACEMENT Left 06/16/2017   Procedure: BRACHIO_BASCILIC ARTERIOVENOUS (AV) FISTULA CREATION;  Surgeon: Waynetta Sandy, MD;  Location: [facility];  Service: Vascular;  Laterality: Left;  . AV FISTULA PLACEMENT W/ PTFE  08/27/2005  . BASCILIC VEIN TRANSPOSITION Left 06/16/2017   Procedure: BRACHIOCEPHALIC TRANSPOSITION  LEFT ARM;  Surgeon: Waynetta Sandy, MD;  Location: [facility];  Service: Vascular;  Laterality: Left;  . BASCILIC VEIN TRANSPOSITION Left 09/01/2017   Procedure: BASILIC VEIN TRANSPOSITION SECOND STAGE;  Surgeon: Waynetta Sandy, MD;  Location: [facility];  Service: Vascular;  Laterality: Left;  . BRAIN BIOPSY    . CESAREAN SECTION    . DG AV DIALYSIS GRAFT DECLOT OR  07/24/2005   AV Gore-Tex graf  . DG AV DIALYSIS GRAFT DECLOT OR  Thrombosis right forearm, loop arteriovenous   Thrombosis right forearm, loop arteriovenous graft  . GASTROSTOMY TUBE PLACEMENT    . KIDNEY TRANSPLANT  2009   Both  . REMOVAL OF GASTROSTOMY TUBE    . THROMBECTOMY / ARTERIOVENOUS GRAFT REVISION  10/12/2006  . THROMBECTOMY / ARTERIOVENOUS GRAFT REVISION  10/16/2006     OB History   None      Home Medications    Prior to Admission medications   Medication Sig Start Date End Date Taking? Authorizing Provider  acetaminophen (TYLENOL) 650 MG CR tablet Take 650-1,300 mg by  mouth every 8 (eight) hours as needed for pain.    Yes [provider]  atorvastatin (LIPITOR) 40 MG tablet Take 1 tablet (40 mg total) by mouth daily. 07/03/17  Yes Midge Minium, MD  cetirizine (ZYRTEC) 10 MG tablet Take 1 tablet (10 mg total) by mouth daily. 05/07/17  Yes Midge Minium, MD  clonazePAM (KLONOPIN) 0.5 MG tablet TAKE 1 TABLET (0.5 MG TOTAL) BY MOUTH AT BEDTIME. 09/17/17  Yes Midge Minium, MD  cloNIDine (CATAPRES) 0.1 MG tablet TAKE 1 TABLET (0.1 MG TOTAL) BY MOUTH 2 (TWO) TIMES DAILY. 12/14/17  Yes Midge Minium, MD  ferric citrate (AURYXIA) 1 GM 210 MG(Fe) tablet Take 210 mg by mouth See admin instructions. Take 210 mg by mouth three times [name] [name] day with meals  and 210 mg two times [name] [name] day with snacks   Yes [provider]  fluticasone (FLONASE) 50 MCG/ACT nasal spray Place 2 sprays into both nostrils daily as needed for allergies or rhinitis.   Yes [provider]  isosorbide dinitrate (ISORDIL) 20 MG tablet Take 20 mg by mouth 3 (three) times daily. 07/26/17  Yes [provider]  lidocaine-prilocaine (EMLA) cream Apply 1 application topically as directed. Apply small amount to access site (AVF) 1 to 2 hrs before Dialysis. Cover with occlusive dressing. 10/07/17  Yes [provider]  meloxicam (MOBIC) 15 MG tablet Take 1 tablet (15 mg total) by mouth daily. 10/01/17  Yes Midge Minium, MD  multivitamin (RENA-VIT) TABS tablet Take 1 tablet by mouth every morning.  03/20/17  Yes [provider]  omeprazole (PRILOSEC) 40 MG capsule TAKE 1 CAPSULE BY MOUTH EVERY DAY 12/14/17  Yes Midge Minium, MD  oxyCODONE-acetaminophen (PERCOCET) 5-325 MG tablet Take 1 tablet by mouth every 6 (six) hours as needed for severe pain. 09/01/17  Yes Rhyne, Hulen Shouts, PA-C  PROAIR HFA 108 (90 Base) MCG/ACT inhaler Inhale 2 puffs into the lungs every 4 (four) hours as needed for wheezing or shortness of breath.  02/18/17  Yes [provider]  QUEtiapine (SEROQUEL) 100 MG tablet TAKE 1 TABLET (100 MG TOTAL) BY MOUTH 3 (THREE) TIMES DAILY. 10/27/17  Yes Midge Minium, MD  traZODone (DESYREL) 50 MG tablet TAKE 0.5-1 TABLETS (25-50 MG TOTAL) BY MOUTH AT BEDTIME AS NEEDED FOR SLEEP. 12/16/17  Yes Midge Minium, MD    Family History Family History  Problem Relation Age of Onset  . Kidney disease Paternal Aunt   . Heart disease Mother   . Heart disease Father   . Colon cancer Neg Hx     Social History Social History   Tobacco Use  . Smoking status: Never Smoker  . Smokeless tobacco: Never Used  Substance Use Topics  . Alcohol use: No  . Drug use: No     Allergies   Lisinopril and Daypro [oxaprozin]   Review of Systems Review of Systems  Respiratory: Negative for shortness of breath.   Cardiovascular: Negative for chest pain.       Swelling to fistula   All other systems reviewed and are negative.    Physical Exam Updated Vital Signs BP (!) 153/92   Pulse 77   Temp 98.5 F (36.9 C) (Oral)   Resp 18   SpO2 99%   Physical Exam  Constitutional: She is oriented to person, place, and time. She appears well-developed and well-nourished.  HENT:  Head: Normocephalic and atraumatic.  Mouth/Throat: Oropharynx is clear and moist and mucous membranes are normal.  Eyes: Pupils are equal, round, and reactive to light. Conjunctivae, EOM and lids are normal.  Neck: Full passive range of motion without pain.  Cardiovascular: Normal rate, regular rhythm, normal heart sounds and normal pulses. Exam reveals no gallop and no friction rub.  No murmur heard. Pulses:      Radial pulses are 2+ on the right side.  Fistula noted to left upper extremity with area of swelling just off to the fistula site.  Bandages in place.  Do not remove bandage given concern for active bleeding.  Positive thrill.  Pulmonary/Chest: Effort normal and breath sounds normal.  Abdominal: Soft. Normal appearance. There is no  tenderness. There is no rigidity and no guarding.  Musculoskeletal: Normal range of motion.  Neurological: She is alert and  oriented to person, place, and time.  Skin: Skin is warm and dry. Capillary refill takes less than 2 seconds.  5x6 cm hematoma noted to AV fistula site. No active bleeding. No surrounding warmth or erythema.   Psychiatric: She has [name] [name] normal mood and affect. Her speech is normal.  Nursing note and vitals reviewed.        ED Treatments / Results  Labs (all labs ordered are listed, but only abnormal results are displayed) Labs Reviewed  CBC - Abnormal; Notable for the following components:      Result Value   RBC 3.63 (*)    MCV 101.4 (*)    All other components within normal limits  BASIC METABOLIC PANEL - Abnormal; Notable for the following components:   Potassium 3.2 (*)    Chloride 93 (*)    Glucose, Bld 106 (*)    Creatinine, Ser 5.79 (*)    GFR calc non Af Amer 7 (*)    GFR calc Af Amer 8 (*)    Anion gap 16 (*)    All other components within normal limits    EKG None  Radiology No results found.  Procedures Procedures (including critical care time)  Medications Ordered in ED Medications - No data to display   Initial Impression / Assessment and Plan / ED Course  I have reviewed the triage vital signs and the nursing notes.  Pertinent labs & imaging results that were available during my care of the patient were reviewed by me and considered in my medical decision making (see chart for details).     62 year old female past medical history of end-stage renal disease (Monday, Wednesday, Friday dialysis patient) presents for evaluation of fistula abnormality.  Told she either had an embolism or pseudoaneurysm of her fistula.  Went to dialysis today and fistula was unable to be accessed and was told to go to the ED for further evaluation.  Patient is afebrile, non-toxic appearing, sitting comfortably on examination table. Vital signs reviewed  and stable.  On exam, AV fistula with some swelling noted to left upper extremity.  Positive thrill.   Discussed with Dr. Donnetta Hutching (vascular).  He recommends obtaining arterial duplex for evaluation of fistula.  He would like to be contacted after ultrasound reviewed.  Discussed with ultrasound tech.  Ultrasound order placed.  BMP shows potassium of 3.2.  BUN is 16 and creatinine is 5.79.  CBC without any significant leukocytosis.  Hemoglobin hematocrit stable.  U/S reviewed.  No evidence of pseudoaneurysm but she does have [name] [name] hematoma.  Discussed patient with Dr. Donnetta Hutching (Vascular). His team will be down to assess the patient.   Patient signed out to Fabian November, MD pending vascular consult.    Final Clinical Impressions(s) / ED Diagnoses   Final diagnoses:  Hematoma    ED Discharge Orders    None       Volanda Napoleon, PA-C 12/16/17 1614    Varney Biles, MD 12/18/17 1540

## 2017-12-16 NOTE — ED Triage Notes (Signed)
Pt presents to ED for assessment of an "embolism" to her left upper arm fistula.  Patient states 2 weeks ago it infiltrated, and last night it began to bleed.  Patient had dialysis treatment today, full treatment done, and they sent her here to see vascular surgeon.

## 2017-12-16 NOTE — ED Notes (Signed)
Pt verbalized understanding of d/c instructions and has no further questions, VSS, NAD.  

## 2017-12-16 NOTE — ED Provider Notes (Signed)
Transfer of Care Note:  I assumed care from [facility] PA on 12/16/17 at 1600.  Please refer to their note for full H&P.  Briefly, patient is a 62yo female with PMHx of anemia, CAD, ESRD on HD who presents with LUE fistula hematoma.  Vascular consulted and US obtained.  Hematoma present however no evidence of pseudoaneurysm.  Plans is to dispo per vascular recommendations.  Vascular surgery drained hematoma at bedside.  Site was wrapped and she was instructed to f/u with Dr. Donzetta Matters in 2 days.  HDS at discharge.  The plan for this patient was discussed with Dr. Regenia Skeeter who voiced agreement and who oversaw evaluation and treatment of this patient.  Old records reviewed.  Imaging and labs reviewed and interpreted by myself and attending and used in the MDM.  Addressed patient question and concerns.  Reviewed discharged instructions with strict precautions given.  Advised patient to schedule follow-up with primary care provider.  Patient verbalized understanding and agrees with plan.  Patient stable at discharge.   Fabian November, MD 12/17/17 [id number]    Sherwood Gambler, MD 12/21/17 [phone]

## 2017-12-17 ENCOUNTER — Other Ambulatory Visit: Payer: Self-pay | Admitting: Family Medicine

## 2017-12-17 ENCOUNTER — Ambulatory Visit: Payer: Self-pay

## 2017-12-17 NOTE — Telephone Encounter (Signed)
If pt is unable to come before next week we can see her at that time, but if symptoms change or worsen she needs to go to the ER

## 2017-12-17 NOTE — Telephone Encounter (Signed)
Proair refill Last refill:03/26/17 by historical provider Last OV:10/01/17 XMI:[id number] Pharmacy: [id number] [name], [place] [phone] (Phone) [phone] (Fax)

## 2017-12-17 NOTE — Telephone Encounter (Signed)
Spoke with daughter, confirmed that if anything worsens she is to go to ED.  Daughter stated verbal understanding.  Appt given for next Thursday at 10:45.  Made appt for 45 minutes to allow time for multiple concerns.

## 2017-12-17 NOTE — Telephone Encounter (Signed)
Copied from [place] [id number]. Topic: Quick Communication - Rx Refill/Question >> Dec 17, 2017  3:49 PM Mylinda Latina, NT wrote: Medication: PROAIR HFA 108 (90 Base) MCG/ACT inhaler   Has the patient contacted their pharmacy? No. (Agent: If no, request that the patient contact the pharmacy for the refill.) (Agent: If yes, when and what did the pharmacy advise?)  Preferred Pharmacy (with phone number or street name): [id number] [name], [place] [phone] (Phone) [phone] (Fax)    Agent: Please be advised that RX refills may take up to 3 business days. We ask that you follow-up with your pharmacy.

## 2017-12-17 NOTE — Telephone Encounter (Signed)
Pt daughter called to report new onset of jerking motion she has noticed in her mother. She states she was with her mother yesterday and did not notice this jerking. Per daughter patient has not been started on any new medication. Pt is a dialysis patient on MWF schedule. Daughter states that her mother is just weak "almost like fatigue" Pt has mult appointments tomorrow and was unable to accept an appointment tomorrow. Daughter stated she would accept appointment on Thursday 10/3. I spoke with Flow Coordinator Bethany who will make this appointment for Ms Argueta and call her to confirm. Care advice read to daughter. Daughter verbalized understanding of all.  Reason for Disposition . [1] Weakness of arm / hand, or leg / foot AND [2] is a chronic symptom (recurrent or ongoing AND present > 4 weeks)  Answer Assessment - Initial Assessment Questions 1. SYMPTOM: "What is the main symptom you are concerned about?" (e.g., weakness, numbness)     Weak jerking motion 2. ONSET: "When did this start?" (minutes, hours, days; while sleeping)     today 3. LAST NORMAL: "When was the last time you were normal (no symptoms)?"     yesterday 4. PATTERN "Does this come and go, or has it been constant since it started?"  "Is it present now?"     Comes and goes 5. CARDIAC SYMPTOMS: "Have you had any of the following symptoms: chest pain, difficulty breathing, palpitations?"     no 6. NEUROLOGIC SYMPTOMS: "Have you had any of the following symptoms: headache, dizziness, vision loss, double vision, changes in speech, unsteady on your feet?"     no 7. OTHER SYMPTOMS: "Do you have any other symptoms?"     no 8. PREGNANCY: "Is there any chance you are pregnant?" "When was your last menstrual period?"     no  Protocols used: NEUROLOGIC DEFICIT-A-AH

## 2017-12-18 ENCOUNTER — Ambulatory Visit (INDEPENDENT_AMBULATORY_CARE_PROVIDER_SITE_OTHER): Payer: Medicare Other | Admitting: Family

## 2017-12-18 ENCOUNTER — Encounter: Payer: Self-pay | Admitting: Family

## 2017-12-18 VITALS — BP 117/79 | HR 106 | Temp 98.6°F | Resp 18 | Ht 65.0 in | Wt 168.6 lb

## 2017-12-18 DIAGNOSIS — R202 Paresthesia of skin: Secondary | ICD-10-CM

## 2017-12-18 DIAGNOSIS — I770 Arteriovenous fistula, acquired: Secondary | ICD-10-CM

## 2017-12-18 DIAGNOSIS — N186 End stage renal disease: Secondary | ICD-10-CM | POA: Diagnosis not present

## 2017-12-18 DIAGNOSIS — M79642 Pain in left hand: Secondary | ICD-10-CM | POA: Diagnosis not present

## 2017-12-18 DIAGNOSIS — T8249XA Other complication of vascular dialysis catheter, initial encounter: Secondary | ICD-10-CM

## 2017-12-18 DIAGNOSIS — N2581 Secondary hyperparathyroidism of renal origin: Secondary | ICD-10-CM | POA: Diagnosis not present

## 2017-12-18 DIAGNOSIS — D631 Anemia in chronic kidney disease: Secondary | ICD-10-CM | POA: Diagnosis not present

## 2017-12-18 DIAGNOSIS — Z940 Kidney transplant status: Secondary | ICD-10-CM | POA: Diagnosis not present

## 2017-12-18 DIAGNOSIS — Z992 Dependence on renal dialysis: Secondary | ICD-10-CM

## 2017-12-18 DIAGNOSIS — Z9889 Other specified postprocedural states: Secondary | ICD-10-CM

## 2017-12-18 NOTE — Telephone Encounter (Signed)
Patient's daughter called in wanting to make sure her mother's med refill will be filled before she goes out of town.   Advised her that it is in process.

## 2017-12-18 NOTE — Progress Notes (Signed)
CC: 2 days follow up s/p ED visit with evacuation of hematoma, Established Dialysis Access  History of Present Illness  [name] [name] is a 62 y.o. (05-12-1955) female who is s/p left second stage basilic vein transposition by Dr. Donzetta Matters (Date: 09/01/17).    She was last evaluated on 10-02-17 by M. Eveland PA-C. At that time the patient's access was ready for use.  The patient's tunneled dialysis catheter can be removed when the patient's Nephrologist is comfortable with the performance of the fistula She was to follow up as needed; she knows to call the office if the area in L AC fossa becomes painful or increases in size.  She was seen in the ED on 12-16-17 by Dr. Donnetta Hutching who evacuated a hematoma of left upper arm AV fistula at bedside.  Site was wrapped and she was instructed to f/u with Dr. Donzetta Matters in 2 days.  Pt reports occasional left hand pain. She denies fever or chills, states occasional mild to moderate pain at evacuated hematoma site.  Pt's daughter states that pt's AV fistula infiltrated during HD when pt flexed her arm.  She dialyzes M-W-F via right IJ tunneled dialysis catheter.   Pt had a double renal transplant about 2009, became sick and had to stop her anti rejection medications, had to resume HD. Daughter states pt's transplanted kidneys are producing some urine.   She has had a previous AV fistula in her right arm.   Pt c/o tingling and numbness in feet, denies wounds.   She returns today for 2 days follow up as advised.     Past Medical History:  Diagnosis Date  . Adenomatous colon polyp 04/1998  . Anemia   . Anxiety   . Arthritis   . Carotid artery disease ([place])    Carotid US 1/18: bilateral ICA 1-39, R thyroid lobe nodule (1.9x2.2x3cm); numerous L thyroid lobe nodules - repeat 1 year  . Chronic kidney disease    M/W/[facility]  . Chronic renal failure    post transplant  . Depression   . Diabetes mellitus   . EBV infection    Per [facility]  . Encephalitis      NMDA per [facility]  . Esophagitis    Grade 1 Distal  . Focal seizure ([facility])    .  Last one 06/2016  . GERD (gastroesophageal reflux disease)   . Hemorrhoids   . History of kidney stones    Passed  . History of subdural hematoma   . Hx of cardiovascular stress test    Lexiscan Myoview (06/2013):  No ischemia, EF 66%; normal.  //  Myoview 12/17: EF 62, no ischemia or scar; Normal  . Hx of echocardiogram    a. Echocardiogram (06/2013):  Mod focal basal hypertrophy, EF 60-65%, normal wall motion, Gr 1 DD, mild AI, mildly dilated ascending aorta (41 mm), mild LAE.; b.  Echo 9/16: mod LVH, EF 60-65%, no RWMA, Gr 1 DD, trivial AI, mild dilated ascending aorta, mild LAE  . Hyperkalemia   . Hyperlipidemia   . Hypertension   . Hypomagnesemia   . Left jugular vein thrombosis    Partial resolved per [facility]  . Metabolic acidosis   . Pneumonia   . Right jugular vein thrombosis    resolved from [facility]  . Seizures ([place])     Social History Social History   Tobacco Use  . Smoking status: Never Smoker  . Smokeless tobacco: Never Used  Substance Use Topics  .  Alcohol use: No  . Drug use: No    Family History Family History  Problem Relation Age of Onset  . Kidney disease Paternal Aunt   . Heart disease Mother   . Heart disease Father   . Colon cancer Neg Hx     Surgical History Past Surgical History:  Procedure Laterality Date  . A/V FISTULAGRAM Left 06/04/2017   Procedure: A/V FISTULAGRAM;  Surgeon: Conrad La Jara, MD;  Location: [facility];  Service: Cardiovascular;  Laterality: Left;  . ABDOMINAL HYSTERECTOMY    . AV FISTULA PLACEMENT  07/04/2005   Cimino AV fistula  . AV FISTULA PLACEMENT  08/27/2005  . AV FISTULA PLACEMENT Left 04/29/2017   Procedure: Creation of Left Arm ARTERIOVENOUS BRACHIOCEPHALIC FISTULA;  Surgeon: Elam Dutch, MD;  Location: [facility];  Service: Vascular;  Laterality: Left;  . AV FISTULA PLACEMENT Left 06/16/2017   Procedure: BRACHIO_BASCILIC ARTERIOVENOUS  (AV) FISTULA CREATION;  Surgeon: Waynetta Sandy, MD;  Location: [facility];  Service: Vascular;  Laterality: Left;  . AV FISTULA PLACEMENT W/ PTFE  08/27/2005  . BASCILIC VEIN TRANSPOSITION Left 06/16/2017   Procedure: BRACHIOCEPHALIC TRANSPOSITION  LEFT ARM;  Surgeon: Waynetta Sandy, MD;  Location: [facility];  Service: Vascular;  Laterality: Left;  . BASCILIC VEIN TRANSPOSITION Left 09/01/2017   Procedure: BASILIC VEIN TRANSPOSITION SECOND STAGE;  Surgeon: Waynetta Sandy, MD;  Location: [facility];  Service: Vascular;  Laterality: Left;  . BRAIN BIOPSY    . CESAREAN SECTION    . DG AV DIALYSIS GRAFT DECLOT OR  07/24/2005   AV Gore-Tex graf  . DG AV DIALYSIS GRAFT DECLOT OR  Thrombosis right forearm, loop arteriovenous   Thrombosis right forearm, loop arteriovenous graft  . GASTROSTOMY TUBE PLACEMENT    . KIDNEY TRANSPLANT  2009   Both  . REMOVAL OF GASTROSTOMY TUBE    . THROMBECTOMY / ARTERIOVENOUS GRAFT REVISION  10/12/2006  . THROMBECTOMY / ARTERIOVENOUS GRAFT REVISION  10/16/2006    Allergies  Allergen Reactions  . Lisinopril Shortness Of Breath, Swelling and Other (See Comments)    Throat irritation also. Patient takes losartan and tolerates fine.  [name] [Oxaprozin] Hives and Dermatitis    Current Outpatient Medications  Medication Sig Dispense Refill  . acetaminophen (TYLENOL) 650 MG CR tablet Take 650-1,300 mg by mouth every 8 (eight) hours as needed for pain.     [name] atorvastatin (LIPITOR) 40 MG tablet Take 1 tablet (40 mg total) by mouth daily. 90 tablet 1  . cetirizine (ZYRTEC) 10 MG tablet Take 1 tablet (10 mg total) by mouth daily. 30 tablet 11  . clonazePAM (KLONOPIN) 0.5 MG tablet TAKE 1 TABLET (0.5 MG TOTAL) BY MOUTH AT BEDTIME. 30 tablet 3  . cloNIDine (CATAPRES) 0.1 MG tablet TAKE 1 TABLET (0.1 MG TOTAL) BY MOUTH 2 (TWO) TIMES DAILY. 180 tablet 0  . ferric citrate (AURYXIA) 1 GM 210 MG(Fe) tablet Take 210 mg by mouth See admin instructions. Take  210 mg by mouth three times a day with meals and 210 mg two times a day with snacks    . fluticasone (FLONASE) 50 MCG/ACT nasal spray Place 2 sprays into both nostrils daily as needed for allergies or rhinitis.    [name] isosorbide dinitrate (ISORDIL) 20 MG tablet Take 20 mg by mouth 3 (three) times daily.  6  . lidocaine-prilocaine (EMLA) cream Apply 1 application topically as directed. Apply small amount to access site (AVF) 1 to 2 hrs before Dialysis. Cover  with occlusive dressing.  3  . meloxicam (MOBIC) 15 MG tablet Take 1 tablet (15 mg total) by mouth daily. 30 tablet 1  . multivitamin (RENA-VIT) TABS tablet Take 1 tablet by mouth every morning.   11  . omeprazole (PRILOSEC) 40 MG capsule TAKE 1 CAPSULE BY MOUTH EVERY DAY 90 capsule 1  . oxyCODONE-acetaminophen (PERCOCET) 5-325 MG tablet Take 1 tablet by mouth every 6 (six) hours as needed for severe pain. 8 tablet 0  . PROAIR HFA 108 (90 Base) MCG/ACT inhaler Inhale 2 puffs into the lungs every 4 (four) hours as needed for wheezing or shortness of breath.   0  . QUEtiapine (SEROQUEL) 100 MG tablet TAKE 1 TABLET (100 MG TOTAL) BY MOUTH 3 (THREE) TIMES DAILY. 90 tablet 1  . traZODone (DESYREL) 50 MG tablet TAKE 0.5-1 TABLETS (25-50 MG TOTAL) BY MOUTH AT BEDTIME AS NEEDED FOR SLEEP. 90 tablet 1   No current facility-administered medications for this visit.      REVIEW OF SYSTEMS: see HPI for pertinent positives and negatives    PHYSICAL EXAMINATION:  Vitals:   12/18/17 1343  BP: 117/79  Pulse: (!) 106  Resp: 18  Temp: 98.6 F (37 C)  TempSrc: Oral  SpO2: 98%  Weight: 168 lb 9.6 oz (76.5 kg)  Height: 5\' 5"  (1.651 m)   Body mass index is 28.06 kg/m.  General: The patient appears her stated age.   HEENT:  No gross abnormalities Pulmonary: Respirations are non-labored, CTAB Abdomen: Soft and non-tender with normal bowel sounds.  Musculoskeletal: There are no major deformities.   Neurologic: No focal weakness . Decreased sensation  to light touch in feet. Bilateral hand grasp strength is 5/5. Sensation is intact in both hands. Skin: There are no ulcer or rashes noted.  Psychiatric: The patient has normal affect. Cardiovascular: There is a regular rate and rhythm without significant murmur appreciated.  Bilateral PT pulses are 2+ palpable, right DP is 1+ palpable, left DP is not palpable.  Bilateral radial pulses are 1+ palpable. Left upper arm AV fistula with palpable thrill and audible bruit. Scant bloody drainage from hematoma puncture site at AV fistula (see photo below)     Left upper arm AV fistula, resolving hematoma    Medical Decision Making  COURTENEY ALDERETE is a 62 y.o. female who developed a hematoma after an HD access infiltration; this was evacuated in the ED on 12-16-17 by Dr. Donnetta Hutching.  She is s/p left second stage basilic vein transposition by Dr. Donzetta Matters (Date: 09/01/17).    There is scant bloody drainage form the puncture site at the hematoma, the hematoma sac is flat and resolving. Pt has mild steal symptoms in her left hand. She dialyzes M-W-F via right IJ tunneled dialysis catheter.   Dr. Donzetta Matters spoke with pt and daughter and examined pt. Will allow more time for the hematoma sac to resolve, scant bleeding to stop. Pt may wash left arm with soap and water, keep right IJ tunneled catheter site dry. Keep clean dressing on left arm AV fistula with mild compression.   Follow up in 2 weeks for wound check with me or PA or Dr. Donzetta Matters.    [name] Chambers, RN, MSN, FNP-C [facility] Office: [phone]  12/18/2017, 3:33 PM  Clinic MD: Donzetta Matters

## 2017-12-21 ENCOUNTER — Other Ambulatory Visit: Payer: Self-pay | Admitting: Family Medicine

## 2017-12-21 DIAGNOSIS — N186 End stage renal disease: Secondary | ICD-10-CM | POA: Diagnosis not present

## 2017-12-21 DIAGNOSIS — D631 Anemia in chronic kidney disease: Secondary | ICD-10-CM | POA: Diagnosis not present

## 2017-12-21 DIAGNOSIS — N2581 Secondary hyperparathyroidism of renal origin: Secondary | ICD-10-CM | POA: Diagnosis not present

## 2017-12-21 MED ORDER — PROAIR HFA 108 (90 BASE) MCG/ACT IN AERS: 2.0000 | INHALATION_SPRAY | RESPIRATORY_TRACT | 1 refills | Status: DC | PRN

## 2017-12-22 DIAGNOSIS — N186 End stage renal disease: Secondary | ICD-10-CM | POA: Diagnosis not present

## 2017-12-22 DIAGNOSIS — Z992 Dependence on renal dialysis: Secondary | ICD-10-CM | POA: Diagnosis not present

## 2017-12-22 DIAGNOSIS — I150 Renovascular hypertension: Secondary | ICD-10-CM | POA: Diagnosis not present

## 2017-12-23 DIAGNOSIS — N2581 Secondary hyperparathyroidism of renal origin: Secondary | ICD-10-CM | POA: Diagnosis not present

## 2017-12-23 DIAGNOSIS — D631 Anemia in chronic kidney disease: Secondary | ICD-10-CM | POA: Diagnosis not present

## 2017-12-23 DIAGNOSIS — D509 Iron deficiency anemia, unspecified: Secondary | ICD-10-CM | POA: Diagnosis not present

## 2017-12-23 DIAGNOSIS — N186 End stage renal disease: Secondary | ICD-10-CM | POA: Diagnosis not present

## 2017-12-24 ENCOUNTER — Other Ambulatory Visit: Payer: Self-pay

## 2017-12-24 ENCOUNTER — Encounter: Payer: Self-pay | Admitting: Family Medicine

## 2017-12-24 ENCOUNTER — Ambulatory Visit (INDEPENDENT_AMBULATORY_CARE_PROVIDER_SITE_OTHER): Payer: Medicare Other | Admitting: Family Medicine

## 2017-12-24 VITALS — BP 112/78 | HR 79 | Temp 99.0°F | Resp 16 | Ht 65.0 in | Wt 169.0 lb

## 2017-12-24 DIAGNOSIS — Z23 Encounter for immunization: Secondary | ICD-10-CM | POA: Diagnosis not present

## 2017-12-24 DIAGNOSIS — J45991 Cough variant asthma: Secondary | ICD-10-CM | POA: Diagnosis not present

## 2017-12-24 DIAGNOSIS — R200 Anesthesia of skin: Secondary | ICD-10-CM | POA: Diagnosis not present

## 2017-12-24 DIAGNOSIS — N393 Stress incontinence (female) (male): Secondary | ICD-10-CM

## 2017-12-24 DIAGNOSIS — R251 Tremor, unspecified: Secondary | ICD-10-CM | POA: Diagnosis not present

## 2017-12-24 DIAGNOSIS — R5383 Other fatigue: Secondary | ICD-10-CM

## 2017-12-24 DIAGNOSIS — Z1231 Encounter for screening mammogram for malignant neoplasm of breast: Secondary | ICD-10-CM | POA: Diagnosis not present

## 2017-12-24 LAB — CBC WITH DIFFERENTIAL/PLATELET
BASOS ABS: 0.1 10*3/uL (ref 0.0–0.1)
Basophils Relative: 0.8 % (ref 0.0–3.0)
EOS PCT: 3.2 % (ref 0.0–5.0)
Eosinophils Absolute: 0.3 10*3/uL (ref 0.0–0.7)
HEMATOCRIT: 35.8 % — AB (ref 36.0–46.0)
Hemoglobin: 12.1 g/dL (ref 12.0–15.0)
LYMPHS ABS: 2.9 10*3/uL (ref 0.7–4.0)
LYMPHS PCT: 35.1 % (ref 12.0–46.0)
MCHC: 33.7 g/dL (ref 30.0–36.0)
MCV: 99.4 fl (ref 78.0–100.0)
MONOS PCT: 10.1 % (ref 3.0–12.0)
Monocytes Absolute: 0.8 10*3/uL (ref 0.1–1.0)
NEUTROS PCT: 50.8 % (ref 43.0–77.0)
Neutro Abs: 4.2 10*3/uL (ref 1.4–7.7)
Platelets: 213 10*3/uL (ref 150.0–400.0)
RBC: 3.6 Mil/uL — AB (ref 3.87–5.11)
RDW: 15.1 % (ref 11.5–15.5)
WBC: 8.2 10*3/uL (ref 4.0–10.5)

## 2017-12-24 LAB — TSH: TSH: 0.62 u[IU]/mL (ref 0.35–4.50)

## 2017-12-24 LAB — BASIC METABOLIC PANEL
BUN: 25 mg/dL — AB (ref 6–23)
CHLORIDE: 91 meq/L — AB (ref 96–112)
CO2: 31 mEq/L (ref 19–32)
Calcium: 9.6 mg/dL (ref 8.4–10.5)
Creatinine, Ser: 6.7 mg/dL (ref 0.40–1.20)
GFR: 8.1 mL/min — AB (ref >60.00)
Glucose, Bld: 104 mg/dL — ABNORMAL HIGH (ref 70–99)
POTASSIUM: 3.4 meq/L — AB (ref 3.5–5.1)
SODIUM: 135 meq/L (ref 135–145)

## 2017-12-24 LAB — B12 AND FOLATE PANEL
Folate: >23.9 ng/mL (ref >5.9)
Vitamin B-12: 945 pg/mL — ABNORMAL HIGH (ref 211–911)

## 2017-12-24 MED ORDER — ALBUTEROL SULFATE HFA 108 (90 BASE) MCG/ACT IN AERS: 2.0000 | INHALATION_SPRAY | RESPIRATORY_TRACT | 3 refills | Status: DC | PRN

## 2017-12-24 MED ORDER — MELOXICAM 15 MG PO TABS: 15.0000 mg | ORAL_TABLET | Freq: Every day | ORAL | 1 refills | Status: DC

## 2017-12-24 NOTE — Progress Notes (Signed)
   Subjective:    Patient ID: [name] [name], female    DOB: Jan 22, 1956, 62 y.o.   MRN: [id number]  HPI Stress incontinence- occurring w/ pt's current cough.  Cough- 'sometimes it can be out of control and it can be hard to breathe'.  sxs started 4-6 weeks ago.  Cough is not productive.  No fevers.  + associated SOB.  Cough improves w/ Albuterol inhaler.  Numbness of feet- bilateral over balls of the feet.  Not burning.  Intermittently painful.  'it's been a long time'.  Tremor of L hand- 'it's been awhile'.  Pt reports she feels like she's losing control when she's holding something and is prone to dropping things.  Also feels a tremor when walking.  Fatigue- ongoing issue for pt.   Review of Systems For ROS see HPI     Objective:   Physical Exam  Constitutional: She is oriented to person, place, and time. She appears well-developed and well-nourished. No distress.  HENT:  Head: Normocephalic and atraumatic.  Right Ear: Tympanic membrane normal.  Left Ear: Tympanic membrane normal.  Nose: Mucosal edema and rhinorrhea present. Right sinus exhibits no maxillary sinus tenderness and no frontal sinus tenderness. Left sinus exhibits no maxillary sinus tenderness and no frontal sinus tenderness.  Mouth/Throat: Mucous membranes are normal. Posterior oropharyngeal erythema (w/ PND) present.  Eyes: Pupils are equal, round, and reactive to light. Conjunctivae and EOM are normal.  Neck: Normal range of motion. Neck supple.  Cardiovascular: Normal rate, regular rhythm and normal heart sounds.  Pulmonary/Chest: Effort normal and breath sounds normal. No respiratory distress. She has no wheezes. She has no rales.  Lymphadenopathy:    She has no cervical adenopathy.  Neurological: She is alert and oriented to person, place, and time. No cranial nerve deficit.  No tremor noted today  Vitals reviewed.         Assessment & Plan:  Fatigue- suspect this is due to her multiple medical  issues but will check labs to ensure no metabolic cause.  Pt expressed understanding and is in agreement w/ plan.   Numbness in feet- ongoing issue for pt.  Check labs for underlying cause.  Refer to neuro for complete assessment.

## 2017-12-24 NOTE — Patient Instructions (Addendum)
Follow up next month as scheduled We'll notify you of your lab results and make any changes if needed Use the Albuterol inhaler as needed for coughing fits Do the squeezing exercises throughout the day to strengthen your pelvic floor We'll call you with your Neurology appt for the numbness and the tremor We'll call you with your mammogram appt PLEASE schedule your eye exam Call with any questions or concerns Hang in there!!

## 2017-12-25 ENCOUNTER — Encounter: Payer: Self-pay | Admitting: General Practice

## 2017-12-25 DIAGNOSIS — N186 End stage renal disease: Secondary | ICD-10-CM | POA: Diagnosis not present

## 2017-12-25 DIAGNOSIS — N2581 Secondary hyperparathyroidism of renal origin: Secondary | ICD-10-CM | POA: Diagnosis not present

## 2017-12-25 DIAGNOSIS — D509 Iron deficiency anemia, unspecified: Secondary | ICD-10-CM | POA: Diagnosis not present

## 2017-12-25 DIAGNOSIS — D631 Anemia in chronic kidney disease: Secondary | ICD-10-CM | POA: Diagnosis not present

## 2017-12-28 DIAGNOSIS — N2581 Secondary hyperparathyroidism of renal origin: Secondary | ICD-10-CM | POA: Diagnosis not present

## 2017-12-28 DIAGNOSIS — N186 End stage renal disease: Secondary | ICD-10-CM | POA: Diagnosis not present

## 2017-12-28 DIAGNOSIS — D509 Iron deficiency anemia, unspecified: Secondary | ICD-10-CM | POA: Diagnosis not present

## 2017-12-28 DIAGNOSIS — D631 Anemia in chronic kidney disease: Secondary | ICD-10-CM | POA: Diagnosis not present

## 2017-12-30 DIAGNOSIS — D509 Iron deficiency anemia, unspecified: Secondary | ICD-10-CM | POA: Diagnosis not present

## 2017-12-30 DIAGNOSIS — N2581 Secondary hyperparathyroidism of renal origin: Secondary | ICD-10-CM | POA: Diagnosis not present

## 2017-12-30 DIAGNOSIS — D631 Anemia in chronic kidney disease: Secondary | ICD-10-CM | POA: Diagnosis not present

## 2017-12-30 DIAGNOSIS — N186 End stage renal disease: Secondary | ICD-10-CM | POA: Diagnosis not present

## 2018-01-01 ENCOUNTER — Other Ambulatory Visit: Payer: Self-pay

## 2018-01-01 ENCOUNTER — Ambulatory Visit (INDEPENDENT_AMBULATORY_CARE_PROVIDER_SITE_OTHER): Payer: Medicare Other | Admitting: Vascular Surgery

## 2018-01-01 ENCOUNTER — Encounter: Payer: Self-pay | Admitting: Vascular Surgery

## 2018-01-01 VITALS — BP 116/75 | HR 104 | Temp 98.0°F | Resp 16 | Ht 65.0 in | Wt 169.0 lb

## 2018-01-01 DIAGNOSIS — D509 Iron deficiency anemia, unspecified: Secondary | ICD-10-CM | POA: Diagnosis not present

## 2018-01-01 DIAGNOSIS — N186 End stage renal disease: Secondary | ICD-10-CM

## 2018-01-01 DIAGNOSIS — Z992 Dependence on renal dialysis: Secondary | ICD-10-CM | POA: Diagnosis not present

## 2018-01-01 DIAGNOSIS — N2581 Secondary hyperparathyroidism of renal origin: Secondary | ICD-10-CM | POA: Diagnosis not present

## 2018-01-01 DIAGNOSIS — D631 Anemia in chronic kidney disease: Secondary | ICD-10-CM | POA: Diagnosis not present

## 2018-01-01 NOTE — Progress Notes (Signed)
Patient ID: [name] [name], female   DOB: 1956/03/22, 62 y.o.   MRN: [id number]  Reason for Consult: Chronic Kidney Disease (2 wk f/u Wound Check.  L AVF. Dialyzes M, W, F)   Referred by Midge Minium, MD  Subjective:     HPI:  [name] [name] is a 62 y.o. female follows up for wound check of her left upper arm fistula after having infiltration event.  Dr. Donnetta Hutching had drained hematoma in the emergency department.  States that her arm is feeling much better.  She continues to dialyze via catheter on the right.  Past Medical History:  Diagnosis Date  . Adenomatous colon polyp 04/1998  . Anemia   . Anxiety   . Arthritis   . Carotid artery disease ([place])    Carotid US 1/18: bilateral ICA 1-39, R thyroid lobe nodule (1.9x2.2x3cm); numerous L thyroid lobe nodules - repeat 1 year  . Chronic kidney disease    M/W/[facility]  . Chronic renal failure    post transplant  . Depression   . Diabetes mellitus   . EBV infection    Per [facility]  . Encephalitis    NMDA per [facility]  . Esophagitis    Grade 1 Distal  . Focal seizure ([facility])    .  Last one 06/2016  . GERD (gastroesophageal reflux disease)   . Hemorrhoids   . History of kidney stones    Passed  . History of subdural hematoma   . Hx of cardiovascular stress test    Lexiscan Myoview (06/2013):  No ischemia, EF 66%; normal.  //  Myoview 12/17: EF 62, no ischemia or scar; Normal  . Hx of echocardiogram    a. Echocardiogram (06/2013):  Mod focal basal hypertrophy, EF 60-65%, normal wall motion, Gr 1 DD, mild AI, mildly dilated ascending aorta (41 mm), mild LAE.; b.  Echo 9/16: mod LVH, EF 60-65%, no RWMA, Gr 1 DD, trivial AI, mild dilated ascending aorta, mild LAE  . Hyperkalemia   . Hyperlipidemia   . Hypertension   . Hypomagnesemia   . Left jugular vein thrombosis    Partial resolved per [facility]  . Metabolic acidosis   . Pneumonia   . Right jugular vein thrombosis    resolved from [facility]  . Seizures ([place])    Family History    Problem Relation Age of Onset  . Kidney disease Paternal Aunt   . Heart disease Mother   . Heart disease Father   . Colon cancer Neg Hx    Past Surgical History:  Procedure Laterality Date  . A/V FISTULAGRAM Left 06/04/2017   Procedure: A/V FISTULAGRAM;  Surgeon: Conrad [place] Hill, MD;  Location: [facility];  Service: Cardiovascular;  Laterality: Left;  . ABDOMINAL HYSTERECTOMY    . AV FISTULA PLACEMENT  07/04/2005   Cimino AV fistula  . AV FISTULA PLACEMENT  08/27/2005  . AV FISTULA PLACEMENT Left 04/29/2017   Procedure: Creation of Left Arm ARTERIOVENOUS BRACHIOCEPHALIC FISTULA;  Surgeon: Elam Dutch, MD;  Location: [facility];  Service: Vascular;  Laterality: Left;  . AV FISTULA PLACEMENT Left 06/16/2017   Procedure: BRACHIO_BASCILIC ARTERIOVENOUS (AV) FISTULA CREATION;  Surgeon: Waynetta Sandy, MD;  Location: [facility];  Service: Vascular;  Laterality: Left;  . AV FISTULA PLACEMENT W/ PTFE  08/27/2005  . BASCILIC VEIN TRANSPOSITION Left 06/16/2017   Procedure: BRACHIOCEPHALIC TRANSPOSITION  LEFT ARM;  Surgeon: Waynetta Sandy, MD;  Location: [facility];  Service: Vascular;  Laterality: Left;  . BASCILIC VEIN TRANSPOSITION Left 09/01/2017   Procedure: BASILIC VEIN TRANSPOSITION SECOND STAGE;  Surgeon: Waynetta Sandy, MD;  Location: [facility];  Service: Vascular;  Laterality: Left;  . BRAIN BIOPSY    . CESAREAN SECTION    . DG AV DIALYSIS GRAFT DECLOT OR  07/24/2005   AV Gore-Tex graf  . DG AV DIALYSIS GRAFT DECLOT OR  Thrombosis right forearm, loop arteriovenous   Thrombosis right forearm, loop arteriovenous graft  . GASTROSTOMY TUBE PLACEMENT    . KIDNEY TRANSPLANT  2009   Both  . REMOVAL OF GASTROSTOMY TUBE    . THROMBECTOMY / ARTERIOVENOUS GRAFT REVISION  10/12/2006  . THROMBECTOMY / ARTERIOVENOUS GRAFT REVISION  10/16/2006    Short Social History:  Social History   Tobacco Use  . Smoking status: Never Smoker  . Smokeless tobacco: Never Used   Substance Use Topics  . Alcohol use: No    Allergies  Allergen Reactions  . Lisinopril Shortness Of Breath, Swelling and Other (See Comments)    Throat irritation also. Patient takes losartan and tolerates fine.  [name] [Oxaprozin] Hives and Dermatitis    Current Outpatient Medications  Medication Sig Dispense Refill  . acetaminophen (TYLENOL) 650 MG CR tablet Take 650-1,300 mg by mouth every 8 (eight) hours as needed for pain.     [name] albuterol (VENTOLIN HFA) 108 (90 Base) MCG/ACT inhaler Inhale 2 puffs into the lungs every 4 (four) hours as needed for wheezing or shortness of breath. 1 Inhaler 3  . atorvastatin (LIPITOR) 40 MG tablet Take 1 tablet (40 mg total) by mouth daily. 90 tablet 1  . cetirizine (ZYRTEC) 10 MG tablet Take 1 tablet (10 mg total) by mouth daily. 30 tablet 11  . clonazePAM (KLONOPIN) 0.5 MG tablet TAKE 1 TABLET (0.5 MG TOTAL) BY MOUTH AT BEDTIME. 30 tablet 3  . cloNIDine (CATAPRES) 0.1 MG tablet TAKE 1 TABLET (0.1 MG TOTAL) BY MOUTH 2 (TWO) TIMES DAILY. 180 tablet 0  . ferric citrate (AURYXIA) 1 GM 210 MG(Fe) tablet Take 210 mg by mouth See admin instructions. Take 210 mg by mouth three times a day with meals and 210 mg two times a day with snacks    . fluticasone (FLONASE) 50 MCG/ACT nasal spray Place 2 sprays into both nostrils daily as needed for allergies or rhinitis.    [name] isosorbide dinitrate (ISORDIL) 20 MG tablet Take 20 mg by mouth 3 (three) times daily.  6  . lidocaine-prilocaine (EMLA) cream Apply 1 application topically as directed. Apply small amount to access site (AVF) 1 to 2 hrs before Dialysis. Cover with occlusive dressing.  3  . meloxicam (MOBIC) 15 MG tablet Take 1 tablet (15 mg total) by mouth daily. 30 tablet 1  . multivitamin (RENA-VIT) TABS tablet Take 1 tablet by mouth every morning.   11  . omeprazole (PRILOSEC) 40 MG capsule TAKE 1 CAPSULE BY MOUTH EVERY DAY 90 capsule 1  . QUEtiapine (SEROQUEL) 100 MG tablet TAKE 1 TABLET (100 MG TOTAL) BY  MOUTH 3 (THREE) TIMES DAILY. 90 tablet 1  . traZODone (DESYREL) 50 MG tablet TAKE 0.5-1 TABLETS (25-50 MG TOTAL) BY MOUTH AT BEDTIME AS NEEDED FOR SLEEP. 90 tablet 1   No current facility-administered medications for this visit.     Review of Systems  Cardiovascular: Cardiovascular negative.  Musculoskeletal:       Improved left upper arm swelling Skin: Skin negative.  Hematologic: Hematologic/lymphatic negative.  Objective:  Objective   Vitals:   01/01/18 1103  BP: 116/75  Pulse: (!) 104  Resp: 16  Temp: 98 F (36.7 C)  TempSrc: Oral  SpO2: 98%  Weight: 169 lb (76.7 kg)  Height: 5\' 5"  (1.651 m)   Body mass index is 28.12 kg/m.  Physical Exam  Constitutional: She appears well-developed.  HENT:  Head: Normocephalic.  Eyes: Pupils are equal, round, and reactive to light.  Neck: Normal range of motion.  Cardiovascular: Normal rate.  Pulses:      Radial pulses are 2+ on the right side, and 2+ on the left side.  There is a strong thrill in her left upper arm AV fistula  Pulmonary/Chest: Effort normal.  Musculoskeletal:  Mild residual edema left upper arm on the medial aspect, no erythema and no surrounding ecchymoses  Skin: Skin is warm and dry.  Psychiatric: She has a normal mood and affect. Her behavior is normal. Judgment and thought content normal.         Assessment/Plan:     62 year old female follows up for wound check.  Currently it appears the fistula is doing well.  She can begin to use this for dialysis.  We will follow-up on an as-needed basis.     Waynetta Sandy MD [facility]

## 2018-01-04 DIAGNOSIS — D631 Anemia in chronic kidney disease: Secondary | ICD-10-CM | POA: Diagnosis not present

## 2018-01-04 DIAGNOSIS — N186 End stage renal disease: Secondary | ICD-10-CM | POA: Diagnosis not present

## 2018-01-04 DIAGNOSIS — N2581 Secondary hyperparathyroidism of renal origin: Secondary | ICD-10-CM | POA: Diagnosis not present

## 2018-01-04 DIAGNOSIS — D509 Iron deficiency anemia, unspecified: Secondary | ICD-10-CM | POA: Diagnosis not present

## 2018-01-06 DIAGNOSIS — N2581 Secondary hyperparathyroidism of renal origin: Secondary | ICD-10-CM | POA: Diagnosis not present

## 2018-01-06 DIAGNOSIS — N186 End stage renal disease: Secondary | ICD-10-CM | POA: Diagnosis not present

## 2018-01-06 DIAGNOSIS — D509 Iron deficiency anemia, unspecified: Secondary | ICD-10-CM | POA: Diagnosis not present

## 2018-01-06 DIAGNOSIS — D631 Anemia in chronic kidney disease: Secondary | ICD-10-CM | POA: Diagnosis not present

## 2018-01-08 DIAGNOSIS — N186 End stage renal disease: Secondary | ICD-10-CM | POA: Diagnosis not present

## 2018-01-08 DIAGNOSIS — N2581 Secondary hyperparathyroidism of renal origin: Secondary | ICD-10-CM | POA: Diagnosis not present

## 2018-01-08 DIAGNOSIS — D509 Iron deficiency anemia, unspecified: Secondary | ICD-10-CM | POA: Diagnosis not present

## 2018-01-08 DIAGNOSIS — D631 Anemia in chronic kidney disease: Secondary | ICD-10-CM | POA: Diagnosis not present

## 2018-01-09 ENCOUNTER — Other Ambulatory Visit: Payer: Self-pay | Admitting: Family Medicine

## 2018-01-11 DIAGNOSIS — D631 Anemia in chronic kidney disease: Secondary | ICD-10-CM | POA: Diagnosis not present

## 2018-01-11 DIAGNOSIS — D509 Iron deficiency anemia, unspecified: Secondary | ICD-10-CM | POA: Diagnosis not present

## 2018-01-11 DIAGNOSIS — N2581 Secondary hyperparathyroidism of renal origin: Secondary | ICD-10-CM | POA: Diagnosis not present

## 2018-01-11 DIAGNOSIS — N186 End stage renal disease: Secondary | ICD-10-CM | POA: Diagnosis not present

## 2018-01-13 DIAGNOSIS — D509 Iron deficiency anemia, unspecified: Secondary | ICD-10-CM | POA: Diagnosis not present

## 2018-01-13 DIAGNOSIS — N2581 Secondary hyperparathyroidism of renal origin: Secondary | ICD-10-CM | POA: Diagnosis not present

## 2018-01-13 DIAGNOSIS — N186 End stage renal disease: Secondary | ICD-10-CM | POA: Diagnosis not present

## 2018-01-13 DIAGNOSIS — D631 Anemia in chronic kidney disease: Secondary | ICD-10-CM | POA: Diagnosis not present

## 2018-01-13 DIAGNOSIS — E1129 Type 2 diabetes mellitus with other diabetic kidney complication: Secondary | ICD-10-CM | POA: Diagnosis not present

## 2018-01-15 DIAGNOSIS — D631 Anemia in chronic kidney disease: Secondary | ICD-10-CM | POA: Diagnosis not present

## 2018-01-15 DIAGNOSIS — N2581 Secondary hyperparathyroidism of renal origin: Secondary | ICD-10-CM | POA: Diagnosis not present

## 2018-01-15 DIAGNOSIS — N186 End stage renal disease: Secondary | ICD-10-CM | POA: Diagnosis not present

## 2018-01-15 DIAGNOSIS — D509 Iron deficiency anemia, unspecified: Secondary | ICD-10-CM | POA: Diagnosis not present

## 2018-01-18 DIAGNOSIS — N2581 Secondary hyperparathyroidism of renal origin: Secondary | ICD-10-CM | POA: Diagnosis not present

## 2018-01-18 DIAGNOSIS — D631 Anemia in chronic kidney disease: Secondary | ICD-10-CM | POA: Diagnosis not present

## 2018-01-18 DIAGNOSIS — N186 End stage renal disease: Secondary | ICD-10-CM | POA: Diagnosis not present

## 2018-01-18 DIAGNOSIS — D509 Iron deficiency anemia, unspecified: Secondary | ICD-10-CM | POA: Diagnosis not present

## 2018-01-20 DIAGNOSIS — D509 Iron deficiency anemia, unspecified: Secondary | ICD-10-CM | POA: Diagnosis not present

## 2018-01-20 DIAGNOSIS — N186 End stage renal disease: Secondary | ICD-10-CM | POA: Diagnosis not present

## 2018-01-20 DIAGNOSIS — D631 Anemia in chronic kidney disease: Secondary | ICD-10-CM | POA: Diagnosis not present

## 2018-01-20 DIAGNOSIS — N2581 Secondary hyperparathyroidism of renal origin: Secondary | ICD-10-CM | POA: Diagnosis not present

## 2018-01-22 DIAGNOSIS — N2581 Secondary hyperparathyroidism of renal origin: Secondary | ICD-10-CM | POA: Diagnosis not present

## 2018-01-22 DIAGNOSIS — D631 Anemia in chronic kidney disease: Secondary | ICD-10-CM | POA: Diagnosis not present

## 2018-01-22 DIAGNOSIS — N186 End stage renal disease: Secondary | ICD-10-CM | POA: Diagnosis not present

## 2018-01-22 DIAGNOSIS — I150 Renovascular hypertension: Secondary | ICD-10-CM | POA: Diagnosis not present

## 2018-01-22 DIAGNOSIS — Z992 Dependence on renal dialysis: Secondary | ICD-10-CM | POA: Diagnosis not present

## 2018-01-25 DIAGNOSIS — N2581 Secondary hyperparathyroidism of renal origin: Secondary | ICD-10-CM | POA: Diagnosis not present

## 2018-01-25 DIAGNOSIS — N186 End stage renal disease: Secondary | ICD-10-CM | POA: Diagnosis not present

## 2018-01-25 DIAGNOSIS — D631 Anemia in chronic kidney disease: Secondary | ICD-10-CM | POA: Diagnosis not present

## 2018-01-26 ENCOUNTER — Other Ambulatory Visit: Payer: Self-pay | Admitting: Family Medicine

## 2018-01-26 NOTE — Telephone Encounter (Signed)
Ok to send in #[phone] for pt?

## 2018-01-27 DIAGNOSIS — N186 End stage renal disease: Secondary | ICD-10-CM | POA: Diagnosis not present

## 2018-01-27 DIAGNOSIS — N2581 Secondary hyperparathyroidism of renal origin: Secondary | ICD-10-CM | POA: Diagnosis not present

## 2018-01-27 DIAGNOSIS — D631 Anemia in chronic kidney disease: Secondary | ICD-10-CM | POA: Diagnosis not present

## 2018-01-27 NOTE — Assessment & Plan Note (Signed)
New to provider, ongoing for pt.  Encouraged her to do Kegel's to improve her pelvic floor strength.  If no improvement, will refer to urology.  Pt expressed understanding and is in agreement w/ plan.

## 2018-01-27 NOTE — Assessment & Plan Note (Signed)
Not noted on exam today but this is bothersome for pt.  Will refer back to neuro for complete evaluation and tx.  Pt expressed understanding and is in agreement w/ plan.

## 2018-01-27 NOTE — Assessment & Plan Note (Signed)
Pt has hx of this.  Instructed her to use albuterol as needed for cough.  Pt has not tried this.

## 2018-01-28 ENCOUNTER — Ambulatory Visit: Payer: Self-pay | Admitting: Family Medicine

## 2018-01-29 DIAGNOSIS — N186 End stage renal disease: Secondary | ICD-10-CM | POA: Diagnosis not present

## 2018-01-29 DIAGNOSIS — D631 Anemia in chronic kidney disease: Secondary | ICD-10-CM | POA: Diagnosis not present

## 2018-01-29 DIAGNOSIS — N2581 Secondary hyperparathyroidism of renal origin: Secondary | ICD-10-CM | POA: Diagnosis not present

## 2018-01-31 ENCOUNTER — Other Ambulatory Visit: Payer: Self-pay | Admitting: Family Medicine

## 2018-02-01 DIAGNOSIS — N2581 Secondary hyperparathyroidism of renal origin: Secondary | ICD-10-CM | POA: Diagnosis not present

## 2018-02-01 DIAGNOSIS — N186 End stage renal disease: Secondary | ICD-10-CM | POA: Diagnosis not present

## 2018-02-01 DIAGNOSIS — D631 Anemia in chronic kidney disease: Secondary | ICD-10-CM | POA: Diagnosis not present

## 2018-02-02 DIAGNOSIS — Z452 Encounter for adjustment and management of vascular access device: Secondary | ICD-10-CM | POA: Diagnosis not present

## 2018-02-03 DIAGNOSIS — N2581 Secondary hyperparathyroidism of renal origin: Secondary | ICD-10-CM | POA: Diagnosis not present

## 2018-02-03 DIAGNOSIS — N186 End stage renal disease: Secondary | ICD-10-CM | POA: Diagnosis not present

## 2018-02-03 DIAGNOSIS — D631 Anemia in chronic kidney disease: Secondary | ICD-10-CM | POA: Diagnosis not present

## 2018-02-05 DIAGNOSIS — D631 Anemia in chronic kidney disease: Secondary | ICD-10-CM | POA: Diagnosis not present

## 2018-02-05 DIAGNOSIS — N2581 Secondary hyperparathyroidism of renal origin: Secondary | ICD-10-CM | POA: Diagnosis not present

## 2018-02-05 DIAGNOSIS — N186 End stage renal disease: Secondary | ICD-10-CM | POA: Diagnosis not present

## 2018-02-08 DIAGNOSIS — D631 Anemia in chronic kidney disease: Secondary | ICD-10-CM | POA: Diagnosis not present

## 2018-02-08 DIAGNOSIS — N2581 Secondary hyperparathyroidism of renal origin: Secondary | ICD-10-CM | POA: Diagnosis not present

## 2018-02-08 DIAGNOSIS — N186 End stage renal disease: Secondary | ICD-10-CM | POA: Diagnosis not present

## 2018-02-10 DIAGNOSIS — N2581 Secondary hyperparathyroidism of renal origin: Secondary | ICD-10-CM | POA: Diagnosis not present

## 2018-02-10 DIAGNOSIS — N186 End stage renal disease: Secondary | ICD-10-CM | POA: Diagnosis not present

## 2018-02-10 DIAGNOSIS — D631 Anemia in chronic kidney disease: Secondary | ICD-10-CM | POA: Diagnosis not present

## 2018-02-12 DIAGNOSIS — D631 Anemia in chronic kidney disease: Secondary | ICD-10-CM | POA: Diagnosis not present

## 2018-02-12 DIAGNOSIS — N186 End stage renal disease: Secondary | ICD-10-CM | POA: Diagnosis not present

## 2018-02-12 DIAGNOSIS — N2581 Secondary hyperparathyroidism of renal origin: Secondary | ICD-10-CM | POA: Diagnosis not present

## 2018-02-14 DIAGNOSIS — N2581 Secondary hyperparathyroidism of renal origin: Secondary | ICD-10-CM | POA: Diagnosis not present

## 2018-02-14 DIAGNOSIS — N186 End stage renal disease: Secondary | ICD-10-CM | POA: Diagnosis not present

## 2018-02-14 DIAGNOSIS — D631 Anemia in chronic kidney disease: Secondary | ICD-10-CM | POA: Diagnosis not present

## 2018-02-15 ENCOUNTER — Ambulatory Visit
Admission: RE | Admit: 2018-02-15 | Discharge: 2018-02-15 | Disposition: A | Discharge status: Home / Self Care | Payer: Medicare Other | Source: Ambulatory Visit | Attending: Family Medicine | Admitting: Family Medicine

## 2018-02-15 DIAGNOSIS — Z1231 Encounter for screening mammogram for malignant neoplasm of breast: Secondary | ICD-10-CM

## 2018-02-16 ENCOUNTER — Ambulatory Visit: Payer: Self-pay

## 2018-02-16 DIAGNOSIS — N186 End stage renal disease: Secondary | ICD-10-CM | POA: Diagnosis not present

## 2018-02-16 DIAGNOSIS — D631 Anemia in chronic kidney disease: Secondary | ICD-10-CM | POA: Diagnosis not present

## 2018-02-16 DIAGNOSIS — N2581 Secondary hyperparathyroidism of renal origin: Secondary | ICD-10-CM | POA: Diagnosis not present

## 2018-02-19 DIAGNOSIS — D631 Anemia in chronic kidney disease: Secondary | ICD-10-CM | POA: Diagnosis not present

## 2018-02-19 DIAGNOSIS — N186 End stage renal disease: Secondary | ICD-10-CM | POA: Diagnosis not present

## 2018-02-19 DIAGNOSIS — N2581 Secondary hyperparathyroidism of renal origin: Secondary | ICD-10-CM | POA: Diagnosis not present

## 2018-02-21 DIAGNOSIS — N186 End stage renal disease: Secondary | ICD-10-CM | POA: Diagnosis not present

## 2018-02-21 DIAGNOSIS — Z992 Dependence on renal dialysis: Secondary | ICD-10-CM | POA: Diagnosis not present

## 2018-02-21 DIAGNOSIS — I150 Renovascular hypertension: Secondary | ICD-10-CM | POA: Diagnosis not present

## 2018-02-22 ENCOUNTER — Institutional Professional Consult (permissible substitution): Payer: Medicare Other | Admitting: Neurology

## 2018-02-22 DIAGNOSIS — N2581 Secondary hyperparathyroidism of renal origin: Secondary | ICD-10-CM | POA: Diagnosis not present

## 2018-02-22 DIAGNOSIS — D631 Anemia in chronic kidney disease: Secondary | ICD-10-CM | POA: Diagnosis not present

## 2018-02-22 DIAGNOSIS — N186 End stage renal disease: Secondary | ICD-10-CM | POA: Diagnosis not present

## 2018-02-24 DIAGNOSIS — N186 End stage renal disease: Secondary | ICD-10-CM | POA: Diagnosis not present

## 2018-02-24 DIAGNOSIS — D631 Anemia in chronic kidney disease: Secondary | ICD-10-CM | POA: Diagnosis not present

## 2018-02-24 DIAGNOSIS — N2581 Secondary hyperparathyroidism of renal origin: Secondary | ICD-10-CM | POA: Diagnosis not present

## 2018-02-26 DIAGNOSIS — N2581 Secondary hyperparathyroidism of renal origin: Secondary | ICD-10-CM | POA: Diagnosis not present

## 2018-02-26 DIAGNOSIS — N186 End stage renal disease: Secondary | ICD-10-CM | POA: Diagnosis not present

## 2018-02-26 DIAGNOSIS — D631 Anemia in chronic kidney disease: Secondary | ICD-10-CM | POA: Diagnosis not present

## 2018-03-02 ENCOUNTER — Ambulatory Visit: Payer: Self-pay | Admitting: Family Medicine

## 2018-03-02 ENCOUNTER — Other Ambulatory Visit: Payer: Self-pay | Admitting: Family Medicine

## 2018-03-02 DIAGNOSIS — N2581 Secondary hyperparathyroidism of renal origin: Secondary | ICD-10-CM | POA: Diagnosis not present

## 2018-03-02 DIAGNOSIS — N186 End stage renal disease: Secondary | ICD-10-CM | POA: Diagnosis not present

## 2018-03-04 DIAGNOSIS — N2581 Secondary hyperparathyroidism of renal origin: Secondary | ICD-10-CM | POA: Diagnosis not present

## 2018-03-04 DIAGNOSIS — N186 End stage renal disease: Secondary | ICD-10-CM | POA: Diagnosis not present

## 2018-03-06 DIAGNOSIS — N186 End stage renal disease: Secondary | ICD-10-CM | POA: Diagnosis not present

## 2018-03-06 DIAGNOSIS — N2581 Secondary hyperparathyroidism of renal origin: Secondary | ICD-10-CM | POA: Diagnosis not present

## 2018-03-08 DIAGNOSIS — N186 End stage renal disease: Secondary | ICD-10-CM | POA: Diagnosis not present

## 2018-03-08 DIAGNOSIS — N2581 Secondary hyperparathyroidism of renal origin: Secondary | ICD-10-CM | POA: Diagnosis not present

## 2018-03-08 DIAGNOSIS — D631 Anemia in chronic kidney disease: Secondary | ICD-10-CM | POA: Diagnosis not present

## 2018-03-09 ENCOUNTER — Telehealth: Payer: Self-pay | Admitting: Emergency Medicine

## 2018-03-09 ENCOUNTER — Ambulatory Visit (INDEPENDENT_AMBULATORY_CARE_PROVIDER_SITE_OTHER): Payer: Medicare Other

## 2018-03-09 ENCOUNTER — Encounter: Payer: Self-pay | Admitting: Family Medicine

## 2018-03-09 ENCOUNTER — Other Ambulatory Visit: Payer: Self-pay

## 2018-03-09 ENCOUNTER — Ambulatory Visit (INDEPENDENT_AMBULATORY_CARE_PROVIDER_SITE_OTHER): Payer: Medicare Other | Admitting: Family Medicine

## 2018-03-09 ENCOUNTER — Telehealth: Payer: Self-pay

## 2018-03-09 VITALS — BP 138/82 | HR 98 | Temp 98.8°F | Resp 16 | Ht 65.0 in | Wt 175.2 lb

## 2018-03-09 DIAGNOSIS — I5032 Chronic diastolic (congestive) heart failure: Secondary | ICD-10-CM

## 2018-03-09 DIAGNOSIS — I10 Essential (primary) hypertension: Secondary | ICD-10-CM

## 2018-03-09 DIAGNOSIS — E1122 Type 2 diabetes mellitus with diabetic chronic kidney disease: Secondary | ICD-10-CM | POA: Diagnosis not present

## 2018-03-09 DIAGNOSIS — Z992 Dependence on renal dialysis: Secondary | ICD-10-CM | POA: Diagnosis not present

## 2018-03-09 DIAGNOSIS — R05 Cough: Secondary | ICD-10-CM

## 2018-03-09 DIAGNOSIS — E7849 Other hyperlipidemia: Secondary | ICD-10-CM | POA: Diagnosis not present

## 2018-03-09 DIAGNOSIS — R059 Cough, unspecified: Secondary | ICD-10-CM

## 2018-03-09 DIAGNOSIS — I779 Disorder of arteries and arterioles, unspecified: Secondary | ICD-10-CM

## 2018-03-09 DIAGNOSIS — I739 Peripheral vascular disease, unspecified: Secondary | ICD-10-CM

## 2018-03-09 DIAGNOSIS — N186 End stage renal disease: Secondary | ICD-10-CM | POA: Diagnosis not present

## 2018-03-09 LAB — LIPID PANEL
CHOL/HDL RATIO: 4
CHOLESTEROL: 166 mg/dL (ref 0–200)
HDL: 42.4 mg/dL (ref >39.00)
LDL Cholesterol: 90 mg/dL (ref 0–99)
NonHDL: 123.39
TRIGLYCERIDES: 169 mg/dL — AB (ref 0.0–149.0)
VLDL: 33.8 mg/dL (ref 0.0–40.0)

## 2018-03-09 LAB — CBC WITH DIFFERENTIAL/PLATELET
Basophils Absolute: 0.1 10*3/uL (ref 0.0–0.1)
Basophils Relative: 0.7 % (ref 0.0–3.0)
Eosinophils Absolute: 0.3 10*3/uL (ref 0.0–0.7)
Eosinophils Relative: 3.9 % (ref 0.0–5.0)
HCT: 35.9 % — ABNORMAL LOW (ref 36.0–46.0)
Hemoglobin: 11.9 g/dL — ABNORMAL LOW (ref 12.0–15.0)
LYMPHS ABS: 2.1 10*3/uL (ref 0.7–4.0)
Lymphocytes Relative: 26.3 % (ref 12.0–46.0)
MCHC: 33 g/dL (ref 30.0–36.0)
MCV: 101.6 fl — AB (ref 78.0–100.0)
MONO ABS: 0.7 10*3/uL (ref 0.1–1.0)
MONOS PCT: 8.3 % (ref 3.0–12.0)
Neutro Abs: 4.8 10*3/uL (ref 1.4–7.7)
Neutrophils Relative %: 60.8 % (ref 43.0–77.0)
Platelets: 249 10*3/uL (ref 150.0–400.0)
RBC: 3.54 Mil/uL — ABNORMAL LOW (ref 3.87–5.11)
RDW: 15.6 % — ABNORMAL HIGH (ref 11.5–15.5)
WBC: 8 10*3/uL (ref 4.0–10.5)

## 2018-03-09 LAB — BASIC METABOLIC PANEL
BUN: 24 mg/dL — ABNORMAL HIGH (ref 6–23)
CO2: 28 mEq/L (ref 19–32)
Calcium: 8.7 mg/dL (ref 8.4–10.5)
Chloride: 95 mEq/L — ABNORMAL LOW (ref 96–112)
Creatinine, Ser: 6.9 mg/dL (ref 0.40–1.20)
GFR: 7.83 mL/min — CL (ref >60.00)
Glucose, Bld: 167 mg/dL — ABNORMAL HIGH (ref 70–99)
Potassium: 3.2 mEq/L — ABNORMAL LOW (ref 3.5–5.1)
Sodium: 138 mEq/L (ref 135–145)

## 2018-03-09 LAB — HEMOGLOBIN A1C: HEMOGLOBIN A1C: 6.2 % (ref 4.6–6.5)

## 2018-03-09 LAB — HEPATIC FUNCTION PANEL
ALK PHOS: 142 U/L — AB (ref 39–117)
ALT: 13 U/L (ref 0–35)
AST: 18 U/L (ref 0–37)
Albumin: 4.2 g/dL (ref 3.5–5.2)
Bilirubin, Direct: 0.1 mg/dL (ref 0.0–0.3)
Total Bilirubin: 0.4 mg/dL (ref 0.2–1.2)
Total Protein: 7.3 g/dL (ref 6.0–8.3)

## 2018-03-09 LAB — TSH: TSH: 0.22 u[IU]/mL — ABNORMAL LOW (ref 0.35–4.50)

## 2018-03-09 MED ORDER — MOMETASONE FUROATE 50 MCG/ACT NA SUSP: 2.0000 | Freq: Every day | NASAL | 12 refills | Status: DC

## 2018-03-09 MED ORDER — LORATADINE 10 MG PO TABS: 10.0000 mg | ORAL_TABLET | Freq: Every day | ORAL | 11 refills | Status: DC

## 2018-03-09 MED ORDER — QUETIAPINE FUMARATE 100 MG PO TABS: 100.0000 mg | ORAL_TABLET | Freq: Every day | ORAL | 1 refills | Status: DC

## 2018-03-09 NOTE — Telephone Encounter (Signed)
Copied from [place] [id number]. Topic: General - Other >> Mar 09, 2018  7:50 AM Oneta Rack wrote: Relation to pt: self  Call back number: [phone] Pharmacy: [id number] - [place], [place] (Phone) [phone] (Fax)  Reason for call:  [name] (daughter) called to state she will not be in attendance today at her mother 10am appointment with Dr. Birdie Riddle but her brother will be and he will not remember to address the following concerns: chronic cough for 4 weeks, mucinex sinus maximum temporarily suppressed patient cough. Daughter requesting chest x ray to rule out fluid in chest and pneumonia. Patient experiencing  chronic left hip. Patient would like to d/c depressant medication and has not been taken meds and states it makes her  drowsy. Daughter states patient may forget to address at the the time of appointment

## 2018-03-09 NOTE — Telephone Encounter (Signed)
[facility] called with critical on pt. Creatinine at 6.9 and GFR at 7.83.

## 2018-03-09 NOTE — Patient Instructions (Signed)
Follow up in 3-4 months to recheck diabetes We'll notify you of your lab results and make any changes if needed GO TO THE [facility] OFFICE TO GET YOUR CHEST XRAY ([place]) START the Loratadine daily to decrease post nasal drip (INSTEAD of the Cetirizine/Zyrtec) START the Nasonex nasal spray (INSTEAD of the Flonase) TAKE the Quetiapine (Seroquel) nightly rather than 3x/day USE the Albueterol inhaler as needed for cough or shortness of breath Call with any questions or concerns Happy Holidays!!!

## 2018-03-09 NOTE — Telephone Encounter (Signed)
FYI

## 2018-03-09 NOTE — Progress Notes (Signed)
   Subjective:    Patient ID: [name] [name], female    DOB: 03-Dec-1955, 62 y.o.   MRN: [id number]  HPI Hyperlipidemia- chronic problem, on Lipitor 40mg  daily.  No abd pain, N/V  DM- chronic problem, currently diet controlled.  UTD on foot exam.  Due for eye exam.  On HD so no need for renal protection.  Pt is up 6 lbs from last visit.  No numbness/tingling of hands/feet.  HTN- chronic problem, adequate control on Clonidine 0.1mg  BID.  No CP, SOB, HAs, visual changes.  Cough- pt has hx of cough variant asthma, 'constantly coughing'- worse at night.  + PND.  Using Flonase, Mucinex as needed.  Taking daily antihistamine.  Using Albuterol w/o cough relief  Depression- daughter called indicating that pt is not taking Seroquel due to drowsiness.  Pt doesn't feel that she's depressed and doesn't feel that she needs the medication.   Review of Systems For ROS see HPI     Objective:   Physical Exam Vitals signs reviewed.  Constitutional:      General: She is not in acute distress.    Appearance: Normal appearance. She is well-developed.  HENT:     Head: Normocephalic and atraumatic.     Nose: Congestion present.     Mouth/Throat:     Comments: PND Eyes:     Conjunctiva/sclera: Conjunctivae normal.     Pupils: Pupils are equal, round, and reactive to light.  Neck:     Musculoskeletal: Normal range of motion and neck supple.     Thyroid: No thyromegaly.  Cardiovascular:     Rate and Rhythm: Normal rate and regular rhythm.     Heart sounds: Normal heart sounds. No murmur.  Pulmonary:     Effort: Pulmonary effort is normal. No respiratory distress.     Breath sounds: Normal breath sounds.     Comments: + dry cough Abdominal:     General: There is no distension.     Palpations: Abdomen is soft.     Tenderness: There is no abdominal tenderness.  Lymphadenopathy:     Cervical: No cervical adenopathy.  Skin:    General: Skin is warm and dry.  Neurological:     Mental Status:  She is alert and oriented to person, place, and time. Mental status is at baseline.  Psychiatric:        Behavior: Behavior normal.           Assessment & Plan:  Cough- ongoing issue.  Suspect this is allergy triggered but due to chronicity, will get CXR.  Switch to Claritin and Nasonex to see if this alleviates sxs.  Continue albuterol prn.  Reviewed supportive care and red flags that should prompt return.  Pt expressed understanding and is in agreement w/ plan.

## 2018-03-10 ENCOUNTER — Other Ambulatory Visit (INDEPENDENT_AMBULATORY_CARE_PROVIDER_SITE_OTHER): Payer: Medicare Other

## 2018-03-10 DIAGNOSIS — D631 Anemia in chronic kidney disease: Secondary | ICD-10-CM | POA: Diagnosis not present

## 2018-03-10 DIAGNOSIS — R7989 Other specified abnormal findings of blood chemistry: Secondary | ICD-10-CM

## 2018-03-10 DIAGNOSIS — N2581 Secondary hyperparathyroidism of renal origin: Secondary | ICD-10-CM | POA: Diagnosis not present

## 2018-03-10 DIAGNOSIS — N186 End stage renal disease: Secondary | ICD-10-CM | POA: Diagnosis not present

## 2018-03-10 LAB — T4, FREE: Free T4: 0.71 ng/dL (ref 0.60–1.60)

## 2018-03-10 LAB — T3, FREE: T3, Free: 3.3 pg/mL (ref 2.3–4.2)

## 2018-03-10 NOTE — Telephone Encounter (Signed)
Pt is on dialysis.  I am aware.

## 2018-03-11 ENCOUNTER — Telehealth: Payer: Self-pay | Admitting: Family Medicine

## 2018-03-11 NOTE — Telephone Encounter (Signed)
Pt given lab and CXR results per notes of Dr Birdie Riddle on 03/10/18 and 03/09/18. Pt verbalized understanding.

## 2018-03-12 DIAGNOSIS — N2581 Secondary hyperparathyroidism of renal origin: Secondary | ICD-10-CM | POA: Diagnosis not present

## 2018-03-12 DIAGNOSIS — N186 End stage renal disease: Secondary | ICD-10-CM | POA: Diagnosis not present

## 2018-03-12 DIAGNOSIS — D631 Anemia in chronic kidney disease: Secondary | ICD-10-CM | POA: Diagnosis not present

## 2018-03-14 DIAGNOSIS — N2581 Secondary hyperparathyroidism of renal origin: Secondary | ICD-10-CM | POA: Diagnosis not present

## 2018-03-14 DIAGNOSIS — N186 End stage renal disease: Secondary | ICD-10-CM | POA: Diagnosis not present

## 2018-03-14 DIAGNOSIS — D631 Anemia in chronic kidney disease: Secondary | ICD-10-CM | POA: Diagnosis not present

## 2018-03-16 DIAGNOSIS — N2581 Secondary hyperparathyroidism of renal origin: Secondary | ICD-10-CM | POA: Diagnosis not present

## 2018-03-16 DIAGNOSIS — D631 Anemia in chronic kidney disease: Secondary | ICD-10-CM | POA: Diagnosis not present

## 2018-03-16 DIAGNOSIS — N186 End stage renal disease: Secondary | ICD-10-CM | POA: Diagnosis not present

## 2018-03-19 DIAGNOSIS — D631 Anemia in chronic kidney disease: Secondary | ICD-10-CM | POA: Diagnosis not present

## 2018-03-19 DIAGNOSIS — N186 End stage renal disease: Secondary | ICD-10-CM | POA: Diagnosis not present

## 2018-03-19 DIAGNOSIS — N2581 Secondary hyperparathyroidism of renal origin: Secondary | ICD-10-CM | POA: Diagnosis not present

## 2018-03-21 DIAGNOSIS — N2581 Secondary hyperparathyroidism of renal origin: Secondary | ICD-10-CM | POA: Diagnosis not present

## 2018-03-21 DIAGNOSIS — N186 End stage renal disease: Secondary | ICD-10-CM | POA: Diagnosis not present

## 2018-03-21 DIAGNOSIS — D631 Anemia in chronic kidney disease: Secondary | ICD-10-CM | POA: Diagnosis not present

## 2018-03-23 DIAGNOSIS — D631 Anemia in chronic kidney disease: Secondary | ICD-10-CM | POA: Diagnosis not present

## 2018-03-23 DIAGNOSIS — N2581 Secondary hyperparathyroidism of renal origin: Secondary | ICD-10-CM | POA: Diagnosis not present

## 2018-03-23 DIAGNOSIS — N186 End stage renal disease: Secondary | ICD-10-CM | POA: Diagnosis not present

## 2018-03-24 ENCOUNTER — Other Ambulatory Visit: Payer: Self-pay | Admitting: Family Medicine

## 2018-03-24 DIAGNOSIS — I150 Renovascular hypertension: Secondary | ICD-10-CM | POA: Diagnosis not present

## 2018-03-24 DIAGNOSIS — Z992 Dependence on renal dialysis: Secondary | ICD-10-CM | POA: Diagnosis not present

## 2018-03-24 DIAGNOSIS — N186 End stage renal disease: Secondary | ICD-10-CM | POA: Diagnosis not present

## 2018-03-25 MED ORDER — QUETIAPINE FUMARATE 100 MG PO TABS: ORAL_TABLET | ORAL | 1 refills | Status: DC

## 2018-03-25 NOTE — Addendum Note (Signed)
Addended by: Davis Gourd on: 03/25/2018 08:33 AM   Modules accepted: Orders

## 2018-03-26 DIAGNOSIS — N2581 Secondary hyperparathyroidism of renal origin: Secondary | ICD-10-CM | POA: Diagnosis not present

## 2018-03-26 DIAGNOSIS — N186 End stage renal disease: Secondary | ICD-10-CM | POA: Diagnosis not present

## 2018-03-26 DIAGNOSIS — Z23 Encounter for immunization: Secondary | ICD-10-CM | POA: Diagnosis not present

## 2018-03-26 DIAGNOSIS — D631 Anemia in chronic kidney disease: Secondary | ICD-10-CM | POA: Diagnosis not present

## 2018-03-29 ENCOUNTER — Other Ambulatory Visit: Payer: Self-pay | Admitting: General Practice

## 2018-03-29 DIAGNOSIS — D631 Anemia in chronic kidney disease: Secondary | ICD-10-CM | POA: Diagnosis not present

## 2018-03-29 DIAGNOSIS — N186 End stage renal disease: Secondary | ICD-10-CM | POA: Diagnosis not present

## 2018-03-29 DIAGNOSIS — N2581 Secondary hyperparathyroidism of renal origin: Secondary | ICD-10-CM | POA: Diagnosis not present

## 2018-03-29 DIAGNOSIS — Z23 Encounter for immunization: Secondary | ICD-10-CM | POA: Diagnosis not present

## 2018-03-29 MED ORDER — ALBUTEROL SULFATE HFA 108 (90 BASE) MCG/ACT IN AERS: 2.0000 | INHALATION_SPRAY | RESPIRATORY_TRACT | 3 refills | Status: DC | PRN

## 2018-03-31 DIAGNOSIS — Z23 Encounter for immunization: Secondary | ICD-10-CM | POA: Diagnosis not present

## 2018-03-31 DIAGNOSIS — N2581 Secondary hyperparathyroidism of renal origin: Secondary | ICD-10-CM | POA: Diagnosis not present

## 2018-03-31 DIAGNOSIS — N186 End stage renal disease: Secondary | ICD-10-CM | POA: Diagnosis not present

## 2018-03-31 DIAGNOSIS — D631 Anemia in chronic kidney disease: Secondary | ICD-10-CM | POA: Diagnosis not present

## 2018-04-02 DIAGNOSIS — D631 Anemia in chronic kidney disease: Secondary | ICD-10-CM | POA: Diagnosis not present

## 2018-04-02 DIAGNOSIS — N2581 Secondary hyperparathyroidism of renal origin: Secondary | ICD-10-CM | POA: Diagnosis not present

## 2018-04-02 DIAGNOSIS — N186 End stage renal disease: Secondary | ICD-10-CM | POA: Diagnosis not present

## 2018-04-02 DIAGNOSIS — Z23 Encounter for immunization: Secondary | ICD-10-CM | POA: Diagnosis not present

## 2018-04-04 ENCOUNTER — Other Ambulatory Visit: Payer: Self-pay | Admitting: Family Medicine

## 2018-04-05 DIAGNOSIS — Z23 Encounter for immunization: Secondary | ICD-10-CM | POA: Diagnosis not present

## 2018-04-05 DIAGNOSIS — N186 End stage renal disease: Secondary | ICD-10-CM | POA: Diagnosis not present

## 2018-04-05 DIAGNOSIS — N2581 Secondary hyperparathyroidism of renal origin: Secondary | ICD-10-CM | POA: Diagnosis not present

## 2018-04-05 DIAGNOSIS — D631 Anemia in chronic kidney disease: Secondary | ICD-10-CM | POA: Diagnosis not present

## 2018-04-05 NOTE — Telephone Encounter (Signed)
Last OV 03/09/18 Clonazepam last filled 09/17/17 #30 with 3

## 2018-04-06 ENCOUNTER — Institutional Professional Consult (permissible substitution): Payer: Medicare Other | Admitting: Neurology

## 2018-04-06 ENCOUNTER — Telehealth: Payer: Self-pay | Admitting: Neurology

## 2018-04-06 NOTE — Telephone Encounter (Signed)
This patient did not show for a new patient appointment today. 

## 2018-04-07 ENCOUNTER — Encounter: Payer: Self-pay | Admitting: Neurology

## 2018-04-07 DIAGNOSIS — D631 Anemia in chronic kidney disease: Secondary | ICD-10-CM | POA: Diagnosis not present

## 2018-04-07 DIAGNOSIS — N186 End stage renal disease: Secondary | ICD-10-CM | POA: Diagnosis not present

## 2018-04-07 DIAGNOSIS — Z23 Encounter for immunization: Secondary | ICD-10-CM | POA: Diagnosis not present

## 2018-04-07 DIAGNOSIS — N2581 Secondary hyperparathyroidism of renal origin: Secondary | ICD-10-CM | POA: Diagnosis not present

## 2018-04-09 DIAGNOSIS — N186 End stage renal disease: Secondary | ICD-10-CM | POA: Diagnosis not present

## 2018-04-09 DIAGNOSIS — N2581 Secondary hyperparathyroidism of renal origin: Secondary | ICD-10-CM | POA: Diagnosis not present

## 2018-04-09 DIAGNOSIS — Z23 Encounter for immunization: Secondary | ICD-10-CM | POA: Diagnosis not present

## 2018-04-09 DIAGNOSIS — D631 Anemia in chronic kidney disease: Secondary | ICD-10-CM | POA: Diagnosis not present

## 2018-04-12 DIAGNOSIS — N186 End stage renal disease: Secondary | ICD-10-CM | POA: Diagnosis not present

## 2018-04-12 DIAGNOSIS — Z23 Encounter for immunization: Secondary | ICD-10-CM | POA: Diagnosis not present

## 2018-04-12 DIAGNOSIS — N2581 Secondary hyperparathyroidism of renal origin: Secondary | ICD-10-CM | POA: Diagnosis not present

## 2018-04-12 DIAGNOSIS — D631 Anemia in chronic kidney disease: Secondary | ICD-10-CM | POA: Diagnosis not present

## 2018-04-14 DIAGNOSIS — N186 End stage renal disease: Secondary | ICD-10-CM | POA: Diagnosis not present

## 2018-04-14 DIAGNOSIS — N2581 Secondary hyperparathyroidism of renal origin: Secondary | ICD-10-CM | POA: Diagnosis not present

## 2018-04-14 DIAGNOSIS — D631 Anemia in chronic kidney disease: Secondary | ICD-10-CM | POA: Diagnosis not present

## 2018-04-14 DIAGNOSIS — Z23 Encounter for immunization: Secondary | ICD-10-CM | POA: Diagnosis not present

## 2018-04-14 DIAGNOSIS — E1129 Type 2 diabetes mellitus with other diabetic kidney complication: Secondary | ICD-10-CM | POA: Diagnosis not present

## 2018-04-16 DIAGNOSIS — N2581 Secondary hyperparathyroidism of renal origin: Secondary | ICD-10-CM | POA: Diagnosis not present

## 2018-04-16 DIAGNOSIS — Z23 Encounter for immunization: Secondary | ICD-10-CM | POA: Diagnosis not present

## 2018-04-16 DIAGNOSIS — N186 End stage renal disease: Secondary | ICD-10-CM | POA: Diagnosis not present

## 2018-04-16 DIAGNOSIS — D631 Anemia in chronic kidney disease: Secondary | ICD-10-CM | POA: Diagnosis not present

## 2018-04-19 DIAGNOSIS — N186 End stage renal disease: Secondary | ICD-10-CM | POA: Diagnosis not present

## 2018-04-19 DIAGNOSIS — D631 Anemia in chronic kidney disease: Secondary | ICD-10-CM | POA: Diagnosis not present

## 2018-04-19 DIAGNOSIS — N2581 Secondary hyperparathyroidism of renal origin: Secondary | ICD-10-CM | POA: Diagnosis not present

## 2018-04-19 DIAGNOSIS — Z23 Encounter for immunization: Secondary | ICD-10-CM | POA: Diagnosis not present

## 2018-04-21 DIAGNOSIS — N186 End stage renal disease: Secondary | ICD-10-CM | POA: Diagnosis not present

## 2018-04-21 DIAGNOSIS — Z23 Encounter for immunization: Secondary | ICD-10-CM | POA: Diagnosis not present

## 2018-04-21 DIAGNOSIS — N2581 Secondary hyperparathyroidism of renal origin: Secondary | ICD-10-CM | POA: Diagnosis not present

## 2018-04-21 DIAGNOSIS — D631 Anemia in chronic kidney disease: Secondary | ICD-10-CM | POA: Diagnosis not present

## 2018-04-23 DIAGNOSIS — Z23 Encounter for immunization: Secondary | ICD-10-CM | POA: Diagnosis not present

## 2018-04-23 DIAGNOSIS — D631 Anemia in chronic kidney disease: Secondary | ICD-10-CM | POA: Diagnosis not present

## 2018-04-23 DIAGNOSIS — N186 End stage renal disease: Secondary | ICD-10-CM | POA: Diagnosis not present

## 2018-04-23 DIAGNOSIS — N2581 Secondary hyperparathyroidism of renal origin: Secondary | ICD-10-CM | POA: Diagnosis not present

## 2018-04-24 DIAGNOSIS — N186 End stage renal disease: Secondary | ICD-10-CM | POA: Diagnosis not present

## 2018-04-24 DIAGNOSIS — Z992 Dependence on renal dialysis: Secondary | ICD-10-CM | POA: Diagnosis not present

## 2018-04-24 DIAGNOSIS — I150 Renovascular hypertension: Secondary | ICD-10-CM | POA: Diagnosis not present

## 2018-04-26 DIAGNOSIS — N2581 Secondary hyperparathyroidism of renal origin: Secondary | ICD-10-CM | POA: Diagnosis not present

## 2018-04-26 DIAGNOSIS — N186 End stage renal disease: Secondary | ICD-10-CM | POA: Diagnosis not present

## 2018-04-28 DIAGNOSIS — N186 End stage renal disease: Secondary | ICD-10-CM | POA: Diagnosis not present

## 2018-04-28 DIAGNOSIS — N2581 Secondary hyperparathyroidism of renal origin: Secondary | ICD-10-CM | POA: Diagnosis not present

## 2018-04-30 DIAGNOSIS — N2581 Secondary hyperparathyroidism of renal origin: Secondary | ICD-10-CM | POA: Diagnosis not present

## 2018-04-30 DIAGNOSIS — N186 End stage renal disease: Secondary | ICD-10-CM | POA: Diagnosis not present

## 2018-05-03 DIAGNOSIS — N186 End stage renal disease: Secondary | ICD-10-CM | POA: Diagnosis not present

## 2018-05-03 DIAGNOSIS — N2581 Secondary hyperparathyroidism of renal origin: Secondary | ICD-10-CM | POA: Diagnosis not present

## 2018-05-04 ENCOUNTER — Other Ambulatory Visit: Payer: Self-pay | Admitting: Family Medicine

## 2018-05-05 DIAGNOSIS — N186 End stage renal disease: Secondary | ICD-10-CM | POA: Diagnosis not present

## 2018-05-05 DIAGNOSIS — N2581 Secondary hyperparathyroidism of renal origin: Secondary | ICD-10-CM | POA: Diagnosis not present

## 2018-05-06 ENCOUNTER — Ambulatory Visit (INDEPENDENT_AMBULATORY_CARE_PROVIDER_SITE_OTHER): Payer: Medicare Other | Admitting: Family Medicine

## 2018-05-06 ENCOUNTER — Encounter: Payer: Self-pay | Admitting: Family Medicine

## 2018-05-06 ENCOUNTER — Other Ambulatory Visit: Payer: Self-pay | Admitting: Family Medicine

## 2018-05-06 ENCOUNTER — Other Ambulatory Visit: Payer: Self-pay

## 2018-05-06 VITALS — BP 128/82 | HR 75 | Temp 98.5°F | Resp 16 | Ht 65.0 in | Wt 176.0 lb

## 2018-05-06 DIAGNOSIS — M65332 Trigger finger, left middle finger: Secondary | ICD-10-CM | POA: Diagnosis not present

## 2018-05-06 DIAGNOSIS — R200 Anesthesia of skin: Secondary | ICD-10-CM | POA: Diagnosis not present

## 2018-05-06 DIAGNOSIS — M25552 Pain in left hip: Secondary | ICD-10-CM

## 2018-05-06 DIAGNOSIS — R05 Cough: Secondary | ICD-10-CM

## 2018-05-06 DIAGNOSIS — R059 Cough, unspecified: Secondary | ICD-10-CM

## 2018-05-06 DIAGNOSIS — R202 Paresthesia of skin: Secondary | ICD-10-CM

## 2018-05-06 DIAGNOSIS — J45991 Cough variant asthma: Secondary | ICD-10-CM

## 2018-05-06 MED ORDER — BECLOMETHASONE DIPROPIONATE 80 MCG/ACT IN AERS: 1.0000 | INHALATION_SPRAY | Freq: Two times a day (BID) | RESPIRATORY_TRACT | 3 refills | Status: DC

## 2018-05-06 MED ORDER — ALBUTEROL SULFATE (2.5 MG/3ML) 0.083% IN NEBU
2.5000 mg | INHALATION_SOLUTION | Freq: Once | RESPIRATORY_TRACT | Status: AC
  Administered 2018-05-06: 2.5 mg via RESPIRATORY_TRACT

## 2018-05-06 MED ORDER — PREDNISONE 10 MG PO TABS: ORAL_TABLET | ORAL | 0 refills | Status: DC

## 2018-05-06 MED ORDER — GABAPENTIN 100 MG PO CAPS: 100.0000 mg | ORAL_CAPSULE | Freq: Every day | ORAL | 1 refills | Status: DC

## 2018-05-06 NOTE — Patient Instructions (Addendum)
Follow up as needed or as scheduled We'll notify you of your lab results and make any changes if needed Please mention your facial numbness to dialysis We'll call you with your hand specialist and ortho appts START the Prednisone as directed- 3 tabs at the same time x3 days, then 2 tabs x3 days, then 1 tab daily.  Take w/ food START the Qvar inhaler- 1 puff twice daily We'll call you with your Pulmonary appt to evaluate the cough START the Gabapentin nightly to improve numbness and tingling Call with any questions or concerns Hang in there!!

## 2018-05-06 NOTE — Progress Notes (Signed)
   Subjective:    Patient ID: [name] [name], female    DOB: 10-29-1955, 63 y.o.   MRN: [id number]  HPI Cough- 'it's gotten worse'.  Allergy medication is not helping.  Cough is dry, nonproductive.  Pt reports she does not feel ill, 'it's just the cough'.  This has been going on for months.  Cough is worse at night, waking her from sleep.  Nothing makes cough better.  Initially Mucinex improved sxs but that stopped working.  No relief w/ albuterol.  No fevers.  Tingling of mouth and tongue- pt reports sensation of blistering around her mouth and on tip of tongue but no lesions/sores present.  sxs started ~3 weeks ago.  Lorin Picket was increased recently.  Pt is on HD.    L groin pain- pt reports 'I almost have to lift my leg up'.  Pain w/ hip flexion.  No pain w/ leg extension.  No known injury.  Pain is constant.  Painful to stand from a seated position or stepping into a car.  Painful to lie on L side.  L middle finger- trigger finger.  In the AM will be stuck in a flexed position.  Pt reports this has been present 'for a long time' but recently worsening.   Review of Systems For ROS see HPI     Objective:   Physical Exam Vitals signs reviewed.  Constitutional:      General: She is not in acute distress.    Appearance: Normal appearance. She is well-developed.  HENT:     Head: Normocephalic and atraumatic.     Right Ear: Tympanic membrane normal.     Left Ear: Tympanic membrane normal.     Nose: Mucosal edema and rhinorrhea present.     Right Sinus: No maxillary sinus tenderness or frontal sinus tenderness.     Left Sinus: No maxillary sinus tenderness or frontal sinus tenderness.     Mouth/Throat:     Pharynx: Posterior oropharyngeal erythema (w/ PND) present.  Eyes:     Conjunctiva/sclera: Conjunctivae normal.     Pupils: Pupils are equal, round, and reactive to light.  Neck:     Musculoskeletal: Normal range of motion and neck supple.  Cardiovascular:     Rate and Rhythm:  Normal rate and regular rhythm.     Heart sounds: Normal heart sounds.  Pulmonary:     Effort: Pulmonary effort is normal. No respiratory distress.     Breath sounds: Normal breath sounds. No wheezing or rales.  Musculoskeletal:     Comments: R middle finger triggers at PIP joint L hip pain and weakness w/ hip flexion, + groin pain  Lymphadenopathy:     Cervical: No cervical adenopathy.  Neurological:     Mental Status: She is alert.           Assessment & Plan:  L hip pain- deteriorated.  Pt has mentioned this before but she now has to lift her L leg in order to have hip flexion.  Refer to Ortho.  Pt expressed understanding and is in agreement w/ plan.   Numbness- perioral and of feet.  Suspect the feet is due to diabetic neuropathy and that the perioral numbness is an electrolyte issue w/ HD.  Will check labs to r/o metabolic cause.  Start Gabapentin and monitor for improvement.  Trigger finger- new.  Refer to hand specialist

## 2018-05-07 ENCOUNTER — Telehealth: Payer: Self-pay

## 2018-05-07 DIAGNOSIS — N186 End stage renal disease: Secondary | ICD-10-CM | POA: Diagnosis not present

## 2018-05-07 DIAGNOSIS — N2581 Secondary hyperparathyroidism of renal origin: Secondary | ICD-10-CM | POA: Diagnosis not present

## 2018-05-07 LAB — CBC WITH DIFFERENTIAL/PLATELET
Basophils Absolute: 0.1 10*3/uL (ref 0.0–0.1)
Basophils Relative: 1 % (ref 0.0–3.0)
EOS ABS: 0.2 10*3/uL (ref 0.0–0.7)
Eosinophils Relative: 2.6 % (ref 0.0–5.0)
HEMATOCRIT: 42.4 % (ref 36.0–46.0)
Hemoglobin: 14 g/dL (ref 12.0–15.0)
Lymphocytes Relative: 37.5 % (ref 12.0–46.0)
Lymphs Abs: 3.5 10*3/uL (ref 0.7–4.0)
MCHC: 32.9 g/dL (ref 30.0–36.0)
MCV: 97.6 fl (ref 78.0–100.0)
Monocytes Absolute: 0.8 10*3/uL (ref 0.1–1.0)
Monocytes Relative: 8.7 % (ref 3.0–12.0)
Neutro Abs: 4.6 10*3/uL (ref 1.4–7.7)
Neutrophils Relative %: 50.2 % (ref 43.0–77.0)
Platelets: 199 10*3/uL (ref 150.0–400.0)
RBC: 4.34 Mil/uL (ref 3.87–5.11)
RDW: 15.6 % — ABNORMAL HIGH (ref 11.5–15.5)
WBC: 9.2 10*3/uL (ref 4.0–10.5)

## 2018-05-07 LAB — BASIC METABOLIC PANEL
BUN: 22 mg/dL (ref 6–23)
CO2: 29 mEq/L (ref 19–32)
Calcium: 8.8 mg/dL (ref 8.4–10.5)
Chloride: 91 mEq/L — ABNORMAL LOW (ref 96–112)
Creatinine, Ser: 7 mg/dL (ref 0.40–1.20)
GFR: 7.24 mL/min — CL (ref >60.00)
Glucose, Bld: 116 mg/dL — ABNORMAL HIGH (ref 70–99)
Potassium: 3.4 mEq/L — ABNORMAL LOW (ref 3.5–5.1)
Sodium: 136 mEq/L (ref 135–145)

## 2018-05-07 LAB — B12 AND FOLATE PANEL
Folate: >24 ng/mL (ref >5.9)
Vitamin B-12: 986 pg/mL — ABNORMAL HIGH (ref 211–911)

## 2018-05-07 LAB — TSH: TSH: 1.09 u[IU]/mL (ref 0.35–4.50)

## 2018-05-07 NOTE — Telephone Encounter (Signed)
Noted.  Pt is on HD

## 2018-05-07 NOTE — Telephone Encounter (Signed)
Critical Creatinine 7.2  Critical GFR 7.24

## 2018-05-10 DIAGNOSIS — N186 End stage renal disease: Secondary | ICD-10-CM | POA: Diagnosis not present

## 2018-05-10 DIAGNOSIS — N2581 Secondary hyperparathyroidism of renal origin: Secondary | ICD-10-CM | POA: Diagnosis not present

## 2018-05-12 DIAGNOSIS — N2581 Secondary hyperparathyroidism of renal origin: Secondary | ICD-10-CM | POA: Diagnosis not present

## 2018-05-12 DIAGNOSIS — N186 End stage renal disease: Secondary | ICD-10-CM | POA: Diagnosis not present

## 2018-05-14 DIAGNOSIS — N186 End stage renal disease: Secondary | ICD-10-CM | POA: Diagnosis not present

## 2018-05-14 DIAGNOSIS — N2581 Secondary hyperparathyroidism of renal origin: Secondary | ICD-10-CM | POA: Diagnosis not present

## 2018-05-17 DIAGNOSIS — N186 End stage renal disease: Secondary | ICD-10-CM | POA: Diagnosis not present

## 2018-05-17 DIAGNOSIS — N2581 Secondary hyperparathyroidism of renal origin: Secondary | ICD-10-CM | POA: Diagnosis not present

## 2018-05-17 NOTE — Assessment & Plan Note (Signed)
Ongoing issue.  Start Prednisone taper and add ICS.  Refer to pulm for complete evaluation and tx.  Pt expressed understanding and is in agreement w/ plan.

## 2018-05-19 DIAGNOSIS — N2581 Secondary hyperparathyroidism of renal origin: Secondary | ICD-10-CM | POA: Diagnosis not present

## 2018-05-19 DIAGNOSIS — N186 End stage renal disease: Secondary | ICD-10-CM | POA: Diagnosis not present

## 2018-05-21 ENCOUNTER — Other Ambulatory Visit: Payer: Self-pay | Admitting: Family Medicine

## 2018-05-21 DIAGNOSIS — N2581 Secondary hyperparathyroidism of renal origin: Secondary | ICD-10-CM | POA: Diagnosis not present

## 2018-05-21 DIAGNOSIS — N186 End stage renal disease: Secondary | ICD-10-CM | POA: Diagnosis not present

## 2018-05-23 DIAGNOSIS — Z992 Dependence on renal dialysis: Secondary | ICD-10-CM | POA: Diagnosis not present

## 2018-05-23 DIAGNOSIS — N186 End stage renal disease: Secondary | ICD-10-CM | POA: Diagnosis not present

## 2018-05-23 DIAGNOSIS — I150 Renovascular hypertension: Secondary | ICD-10-CM | POA: Diagnosis not present

## 2018-05-24 ENCOUNTER — Other Ambulatory Visit: Payer: Self-pay | Admitting: Family Medicine

## 2018-05-24 DIAGNOSIS — D509 Iron deficiency anemia, unspecified: Secondary | ICD-10-CM | POA: Diagnosis not present

## 2018-05-24 DIAGNOSIS — N2581 Secondary hyperparathyroidism of renal origin: Secondary | ICD-10-CM | POA: Diagnosis not present

## 2018-05-24 DIAGNOSIS — D631 Anemia in chronic kidney disease: Secondary | ICD-10-CM | POA: Diagnosis not present

## 2018-05-24 DIAGNOSIS — E1129 Type 2 diabetes mellitus with other diabetic kidney complication: Secondary | ICD-10-CM | POA: Diagnosis not present

## 2018-05-24 DIAGNOSIS — N186 End stage renal disease: Secondary | ICD-10-CM | POA: Diagnosis not present

## 2018-05-26 DIAGNOSIS — N186 End stage renal disease: Secondary | ICD-10-CM | POA: Diagnosis not present

## 2018-05-26 DIAGNOSIS — E1129 Type 2 diabetes mellitus with other diabetic kidney complication: Secondary | ICD-10-CM | POA: Diagnosis not present

## 2018-05-26 DIAGNOSIS — D509 Iron deficiency anemia, unspecified: Secondary | ICD-10-CM | POA: Diagnosis not present

## 2018-05-26 DIAGNOSIS — N2581 Secondary hyperparathyroidism of renal origin: Secondary | ICD-10-CM | POA: Diagnosis not present

## 2018-05-26 DIAGNOSIS — D631 Anemia in chronic kidney disease: Secondary | ICD-10-CM | POA: Diagnosis not present

## 2018-05-27 ENCOUNTER — Ambulatory Visit (INDEPENDENT_AMBULATORY_CARE_PROVIDER_SITE_OTHER): Payer: Medicare Other

## 2018-05-27 ENCOUNTER — Ambulatory Visit (INDEPENDENT_AMBULATORY_CARE_PROVIDER_SITE_OTHER): Payer: Medicare Other | Admitting: Orthopaedic Surgery

## 2018-05-27 VITALS — Ht 66.0 in | Wt 174.0 lb

## 2018-05-27 DIAGNOSIS — M25552 Pain in left hip: Secondary | ICD-10-CM

## 2018-05-27 MED ORDER — METHYLPREDNISOLONE ACETATE 40 MG/ML IJ SUSP: 40.0000 mg | Freq: Once | INTRAMUSCULAR | Status: DC

## 2018-05-27 NOTE — Progress Notes (Signed)
Office Visit Note   Patient: [name] [name]           Date of Birth: 12-Nov-1955           MRN: [id number] Visit Date: 05/27/2018              Requested by: Midge Minium, MD [address] [place], [place] [address] PCP: Midge Minium, MD   Assessment & Plan: Visit Diagnoses:  1. Pain in left hip     Plan: Impression is chronic left hip pain osteoarthritis with possible degenerative labral tear.  We had Dr. Junius Roads inject her left hip  today under ultrasound.  We will see her back in 4 weeks for recheck and her response to the injection.  Follow-Up Instructions: Return in about 4 weeks (around 06/24/2018).   Orders:  Orders Placed This Encounter  Procedures  . XR HIP UNILAT W OR W/O PELVIS 1V LEFT   No orders of the defined types were placed in this encounter.     Procedures: No procedures performed   Clinical Data: No additional findings.   Subjective: Chief Complaint  Patient presents with  . Left Hip - Pain    [name] [name] is a 63 year old female who is on permanent disability who comes in with left hip and groin pain for several months without any injuries.  She reports any radicular symptoms.  There are groin pain that is worse with increased walking and standing and activity.  Denies any numbness and tingling.  She cannot take NSAIDs due to being on dialysis.   Review of Systems  Constitutional: Negative.   HENT: Negative.   Eyes: Negative.   Respiratory: Negative.   Cardiovascular: Negative.   Endocrine: Negative.   Musculoskeletal: Negative.   Neurological: Negative.   Hematological: Negative.   Psychiatric/Behavioral: Negative.   All other systems reviewed and are negative.    Objective: Vital Signs: Ht 5\' 6"  (1.676 m)   Wt 174 lb (78.9 kg)   BMI 28.08 kg/m   Physical Exam Vitals signs and nursing note reviewed.  Constitutional:      Appearance: She is well-developed.  HENT:     Head: Normocephalic and atraumatic.  Neck:   Musculoskeletal: Neck supple.  Pulmonary:     Effort: Pulmonary effort is normal.  Abdominal:     Palpations: Abdomen is soft.  Skin:    General: Skin is warm.     Capillary Refill: Capillary refill takes less than 2 seconds.  Neurological:     Mental Status: She is alert and oriented to person, place, and time.  Psychiatric:        Behavior: Behavior normal.        Thought Content: Thought content normal.        Judgment: Judgment normal.     Ortho Exam Left hip exam shows positive FADIR and mild tenderness to the trochanter.  Unable to perform Stinchfield test due to pain in the groin.  Negative logroll. Specialty Comments:  No specialty comments available.  Imaging: Xr Hip Unilat W Or W/o Pelvis 1v Left  Result Date: 05/27/2018 Moderate joint space narrowing of the left hip.    PMFS History: Patient Active Problem List   Diagnosis Date Noted  . Stress incontinence 12/24/2017  . Cough variant asthma 12/24/2017  . Tremor of left hand 12/24/2017  . History of internal jugular thrombosis 03/26/2017  . 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 Chronic diastolic CHF (congestive heart failure) ([facility]) 07/08/2016  .  Anemia 07/08/2016  . Abnormal brain MRI 07/03/2016  . Fasciculation 06/18/2016  . Carotid artery disease ([facility])   . Physical exam 01/28/2016  . Hyperlipidemia 06/28/2015  . Diabetes mellitus type II, controlled ([facility]) 06/28/2015  . Thoracic ascending aortic aneurysm (41 mm on Echo 06/2013) 07/06/2013  . ESRD on dialysis ([facility]) 06/16/2013  . History of renal transplant 06/16/2013  . GERD (gastroesophageal reflux disease) 08/13/2009  . OTH&UNSPEC NONINFECTIOUS GASTROENTERITIS&COLITIS 08/13/2009  . Essential hypertension 04/25/2009  . HEMORRHOIDS-INTERNAL 04/25/2009  . WEIGHT LOSS 04/25/2009  . NAUSEA AND VOMITING 04/25/2009  . PERSONAL HX COLONIC POLYPS 04/25/2009   Past Medical History:  Diagnosis Date  . Adenomatous colon  polyp 04/1998  . Anemia   . Anxiety   . Arthritis   . Carotid artery disease ([facility])    Carotid US 1/18: bilateral ICA 1-39, R thyroid lobe nodule (1.9x2.2x3cm); numerous L thyroid lobe nodules - repeat 1 year  . Chronic kidney disease    M/W/[facility]  . Chronic renal failure    post transplant  . Depression   . Diabetes mellitus   . EBV infection    Per [facility]  . Encephalitis    NMDA per [facility]  . Esophagitis    Grade 1 Distal  . Focal seizure ([facility])    .  Last one 06/2016  . GERD (gastroesophageal reflux disease)   . Hemorrhoids   . History of kidney stones    Passed  . History of subdural hematoma   . Hx of cardiovascular stress test    Lexiscan Myoview (06/2013):  No ischemia, EF 66%; normal.  //  Myoview 12/17: EF 62, no ischemia or scar; Normal  . Hx of echocardiogram    a. Echocardiogram (06/2013):  Mod focal basal hypertrophy, EF 60-65%, normal wall motion, Gr 1 DD, mild AI, mildly dilated ascending aorta (41 mm), mild LAE.; b.  Echo 9/16: mod LVH, EF 60-65%, no RWMA, Gr 1 DD, trivial AI, mild dilated ascending aorta, mild LAE  . Hyperkalemia   . Hyperlipidemia   . Hypertension   . Hypomagnesemia   . Left jugular vein thrombosis    Partial resolved per [facility]  . Metabolic acidosis   . Pneumonia   . Right jugular vein thrombosis    resolved from [facility]  . Seizures ([facility])     Family History  Problem Relation Age of Onset  . Kidney disease Paternal Aunt   . Heart disease Mother   . Heart disease Father   . Colon cancer Neg Hx     Past Surgical History:  Procedure Laterality Date  . A/V FISTULAGRAM Left 06/04/2017   Procedure: A/V FISTULAGRAM;  Surgeon: Conrad Louisiana, MD;  Location: [facility];  Service: Cardiovascular;  Laterality: Left;  . ABDOMINAL HYSTERECTOMY    . AV FISTULA PLACEMENT  07/04/2005   [place] AV fistula  . AV FISTULA PLACEMENT  08/27/2005  . AV FISTULA PLACEMENT Left 04/29/2017   Procedure: Creation of Left Arm ARTERIOVENOUS BRACHIOCEPHALIC  FISTULA;  Surgeon: Elam Dutch, MD;  Location: [facility];  Service: Vascular;  Laterality: Left;  . AV FISTULA PLACEMENT Left 06/16/2017   Procedure: BRACHIO_BASCILIC ARTERIOVENOUS (AV) FISTULA CREATION;  Surgeon: Waynetta Sandy, MD;  Location: [facility];  Service: Vascular;  Laterality: Left;  . AV FISTULA PLACEMENT W/ PTFE  08/27/2005  . BASCILIC VEIN TRANSPOSITION Left 06/16/2017   Procedure: BRACHIOCEPHALIC TRANSPOSITION  LEFT ARM;  Surgeon: Waynetta Sandy, MD;  Location: [facility];  Service:  Vascular;  Laterality: Left;  . BASCILIC VEIN TRANSPOSITION Left 09/01/2017   Procedure: BASILIC VEIN TRANSPOSITION SECOND STAGE;  Surgeon: Waynetta Sandy, MD;  Location: [facility];  Service: Vascular;  Laterality: Left;  . BRAIN BIOPSY    . CESAREAN SECTION    . DG AV DIALYSIS GRAFT DECLOT OR  07/24/2005   AV Gore-Tex graf  . DG AV DIALYSIS GRAFT DECLOT OR  Thrombosis right forearm, loop arteriovenous   Thrombosis right forearm, loop arteriovenous graft  . GASTROSTOMY TUBE PLACEMENT    . KIDNEY TRANSPLANT  2009   Both  . REMOVAL OF GASTROSTOMY TUBE    . THROMBECTOMY / ARTERIOVENOUS GRAFT REVISION  10/12/2006  . THROMBECTOMY / ARTERIOVENOUS GRAFT REVISION  10/16/2006   Social History   Occupational History  . Occupation: Investment banker, operational   Tobacco Use  . Smoking status: Never Smoker  . Smokeless tobacco: Never Used  Substance and Sexual Activity  . Alcohol use: No  . Drug use: No  . Sexual activity: Never

## 2018-05-27 NOTE — Progress Notes (Signed)
Subjective: She is here with left hip pain due to osteoarthritis.  Objective: Pain with passive flexion Janardhana rotation.  Limited range of motion as well.  Procedure: Ultrasound-guided left hip injection: After sterile prep with Betadine, injected 8 cc 1% lidocaine without epinephrine and 40 mg methylprednisolone using a 22-gauge needle, passing the needle through the iliofemoral ligament into the femoral head/neck junction.  Injectate was seen filling the joint capsule.  She had slight pain relief during the immediate anesthetic phase.  Follow-up in 4 weeks with Dr. Erlinda Hong.

## 2018-05-28 DIAGNOSIS — D631 Anemia in chronic kidney disease: Secondary | ICD-10-CM | POA: Diagnosis not present

## 2018-05-28 DIAGNOSIS — D509 Iron deficiency anemia, unspecified: Secondary | ICD-10-CM | POA: Diagnosis not present

## 2018-05-28 DIAGNOSIS — N186 End stage renal disease: Secondary | ICD-10-CM | POA: Diagnosis not present

## 2018-05-28 DIAGNOSIS — E1129 Type 2 diabetes mellitus with other diabetic kidney complication: Secondary | ICD-10-CM | POA: Diagnosis not present

## 2018-05-28 DIAGNOSIS — N2581 Secondary hyperparathyroidism of renal origin: Secondary | ICD-10-CM | POA: Diagnosis not present

## 2018-05-31 DIAGNOSIS — N2581 Secondary hyperparathyroidism of renal origin: Secondary | ICD-10-CM | POA: Diagnosis not present

## 2018-05-31 DIAGNOSIS — D509 Iron deficiency anemia, unspecified: Secondary | ICD-10-CM | POA: Diagnosis not present

## 2018-05-31 DIAGNOSIS — D631 Anemia in chronic kidney disease: Secondary | ICD-10-CM | POA: Diagnosis not present

## 2018-05-31 DIAGNOSIS — E1129 Type 2 diabetes mellitus with other diabetic kidney complication: Secondary | ICD-10-CM | POA: Diagnosis not present

## 2018-05-31 DIAGNOSIS — N186 End stage renal disease: Secondary | ICD-10-CM | POA: Diagnosis not present

## 2018-06-01 ENCOUNTER — Ambulatory Visit (INDEPENDENT_AMBULATORY_CARE_PROVIDER_SITE_OTHER): Payer: Medicare Other | Admitting: Pulmonary Disease

## 2018-06-01 ENCOUNTER — Telehealth: Payer: Self-pay

## 2018-06-01 ENCOUNTER — Ambulatory Visit (INDEPENDENT_AMBULATORY_CARE_PROVIDER_SITE_OTHER)
Admission: RE | Admit: 2018-06-01 | Discharge: 2018-06-01 | Disposition: A | Discharge status: Home / Self Care | Payer: Medicare Other | Source: Ambulatory Visit | Attending: Pulmonary Disease | Admitting: Pulmonary Disease

## 2018-06-01 ENCOUNTER — Encounter: Payer: Self-pay | Admitting: Pulmonary Disease

## 2018-06-01 VITALS — BP 150/92 | HR 82 | Ht 66.0 in | Wt 174.0 lb

## 2018-06-01 DIAGNOSIS — R059 Cough, unspecified: Secondary | ICD-10-CM

## 2018-06-01 DIAGNOSIS — R05 Cough: Secondary | ICD-10-CM | POA: Diagnosis not present

## 2018-06-01 DIAGNOSIS — R911 Solitary pulmonary nodule: Secondary | ICD-10-CM

## 2018-06-01 NOTE — Progress Notes (Signed)
[name] [name]    [id number]    Apr 07, 1955  Primary Care Physician:Tabori, [name], MD  Referring Physician: Midge Minium, MD [address] [place],  [address]  Chief complaint:   Cough for about 4 months  HPI:  Patient with a history of chronic kidney disease for which he is on dialysis Developed a cough about 4 months ago She did not remember having any illness or acute infection preceding the onset of a cough Since treatment with prednisone and albuterol, cough continues to improve  Denies any nasal stuffiness or congestion Denies any change in her work environment-works for [employer]-no recent job change, no recent changes with material she works with  No recent changes at home No pets  No recent travels  No family history of lung disease, she does not have any prior history of breathing problems  She is not limited with activities of daily living, shortness of breath is not restricting, cough is not restricting  Never smoked  She is a chronic kidney disease patient for which she had a transplant in the past, she did develop severe infection that required her being on the ventilator for a while, she developed some voice changes soon after that She currently is on dialysis and tolerating dialysis well   Outpatient Encounter Medications as of 06/01/2018  Medication Sig  . acetaminophen (TYLENOL) 650 MG CR tablet Take 650-1,300 mg by mouth every 8 (eight) hours as needed for pain.   [name] albuterol (VENTOLIN HFA) 108 (90 Base) MCG/ACT inhaler Inhale 2 puffs into the lungs every 4 (four) hours as needed for wheezing or shortness of breath.  [name] atorvastatin (LIPITOR) 40 MG tablet TAKE 1 TABLET BY MOUTH EVERY DAY  . cetirizine (ZYRTEC) 10 MG tablet TAKE 1 TABLET BY MOUTH EVERY DAY  . clonazePAM (KLONOPIN) 0.5 MG tablet TAKE 1 TABLET BY MOUTH AT BEDTIME  . ferric citrate (AURYXIA) 1 GM 210 MG(Fe) tablet Take 210 mg by mouth  See admin instructions. Take 210 mg by mouth three times a day with meals and 210 mg two times a day with snacks  . FLOVENT DISKUS 250 MCG/BLIST AEPB Please specify directions, refills and quantity  . gabapentin (NEURONTIN) 100 MG capsule Take 1 capsule (100 mg total) by mouth at bedtime.  . isosorbide dinitrate (ISORDIL) 20 MG tablet TAKE 1 TABLET BY MOUTH THREE TIMES A DAY  . lidocaine-prilocaine (EMLA) cream Apply 1 application topically as directed. Apply small amount to access site (AVF) 1 to 2 hrs before Dialysis. Cover with occlusive dressing.  . loratadine (CLARITIN) 10 MG tablet Take 1 tablet (10 mg total) by mouth daily.  . meloxicam (MOBIC) 15 MG tablet TAKE 1 TABLET BY MOUTH EVERY DAY  . multivitamin (RENA-VIT) TABS tablet Take 1 tablet by mouth every morning.   [name] omeprazole (PRILOSEC) 40 MG capsule TAKE 1 CAPSULE BY MOUTH EVERY DAY  . QUEtiapine (SEROQUEL) 100 MG tablet TAKE 1 TABLET BY MOUTH EVERYDAY AT BEDTIME  . traZODone (DESYREL) 50 MG tablet TAKE 0.5-1 TABLETS (25-50 MG TOTAL) BY MOUTH AT BEDTIME AS NEEDED FOR SLEEP.  . [DISCONTINUED] cloNIDine (CATAPRES) 0.1 MG tablet TAKE 1 TABLET (0.1 MG TOTAL) BY MOUTH 2 (TWO) TIMES DAILY.  . [DISCONTINUED] mometasone (NASONEX) 50 MCG/ACT nasal spray Place 2 sprays into the nose daily.  . [DISCONTINUED] predniSONE (DELTASONE) 10 MG tablet 3 tabs x3 days and then 2 tabs x3 days and then 1  tab x3 days.  Take w/ food.   Facility-Administered Encounter Medications as of 06/01/2018  Medication  . methylPREDNISolone acetate (DEPO-MEDROL) injection 40 mg    Allergies as of 06/01/2018 - Review Complete 06/01/2018  Allergen Reaction Noted  . Lisinopril Shortness Of Breath, Swelling, and Other (See Comments)   . Daypro [oxaprozin] Hives and Dermatitis 06/28/2015    Past Medical History:  Diagnosis Date  . Adenomatous colon polyp 04/1998  . Anemia   . Anxiety   . Arthritis   . Carotid artery disease ([place])    Carotid US 1/18: bilateral ICA  1-39, R thyroid lobe nodule (1.9x2.2x3cm); numerous L thyroid lobe nodules - repeat 1 year  . Chronic kidney disease    M/W/[facility]  . Chronic renal failure    post transplant  . Depression   . Diabetes mellitus   . EBV infection    Per [facility]  . Encephalitis    NMDA per [facility]  . Esophagitis    Grade 1 Distal  . Focal seizure (H[facility])    .  Last one 06/2016  . GERD (gastroesophageal reflux disease)   . Hemorrhoids   . History of kidney stones    Passed  . History of subdural hematoma   . Hx of cardiovascular stress test    Lexiscan Myoview (06/2013):  No ischemia, EF 66%; normal.  //  Myoview 12/17: EF 62, no ischemia or scar; Normal  . Hx of echocardiogram    a. Echocardiogram (06/2013):  Mod focal basal hypertrophy, EF 60-65%, normal wall motion, Gr 1 DD, mild AI, mildly dilated ascending aorta (41 mm), mild LAE.; b.  Echo 9/16: mod LVH, EF 60-65%, no RWMA, Gr 1 DD, trivial AI, mild dilated ascending aorta, mild LAE  . Hyperkalemia   . Hyperlipidemia   . Hypertension   . Hypomagnesemia   . Left jugular vein thrombosis    Partial resolved per [facility]  . Metabolic acidosis   . Pneumonia   . Right jugular vein thrombosis    resolved from [facility]  . Seizures ([place])     Past Surgical History:  Procedure Laterality Date  . A/V FISTULAGRAM Left 06/04/2017   Procedure: A/V FISTULAGRAM;  Surgeon: Conrad Chester, MD;  Location: [facility];  Service: Cardiovascular;  Laterality: Left;  . ABDOMINAL HYSTERECTOMY    . AV FISTULA PLACEMENT  07/04/2005   Cimino AV fistula  . AV FISTULA PLACEMENT  08/27/2005  . AV FISTULA PLACEMENT Left 04/29/2017   Procedure: Creation of Left Arm ARTERIOVENOUS BRACHIOCEPHALIC FISTULA;  Surgeon: Elam Dutch, MD;  Location: [facility];  Service: Vascular;  Laterality: Left;  . AV FISTULA PLACEMENT Left 06/16/2017   Procedure: BRACHIO_BASCILIC ARTERIOVENOUS (AV) FISTULA CREATION;  Surgeon: Waynetta Sandy, MD;  Location: [facility];  Service: Vascular;   Laterality: Left;  . AV FISTULA PLACEMENT W/ PTFE  08/27/2005  . BASCILIC VEIN TRANSPOSITION Left 06/16/2017   Procedure: BRACHIOCEPHALIC TRANSPOSITION  LEFT ARM;  Surgeon: Waynetta Sandy, MD;  Location: [facility];  Service: Vascular;  Laterality: Left;  . BASCILIC VEIN TRANSPOSITION Left 09/01/2017   Procedure: BASILIC VEIN TRANSPOSITION SECOND STAGE;  Surgeon: Waynetta Sandy, MD;  Location: [place];  Service: Vascular;  Laterality: Left;  . BRAIN BIOPSY    . CESAREAN SECTION    . DG AV DIALYSIS GRAFT DECLOT OR  07/24/2005   AV Gore-Tex graf  . DG AV DIALYSIS GRAFT DECLOT OR  Thrombosis right forearm, loop arteriovenous   Thrombosis  right forearm, loop arteriovenous graft  . GASTROSTOMY TUBE PLACEMENT    . KIDNEY TRANSPLANT  2009   Both  . REMOVAL OF GASTROSTOMY TUBE    . THROMBECTOMY / ARTERIOVENOUS GRAFT REVISION  10/12/2006  . THROMBECTOMY / ARTERIOVENOUS GRAFT REVISION  10/16/2006    Family History  Problem Relation Age of Onset  . Kidney disease Paternal Aunt   . Heart disease Mother   . Heart disease Father   . Colon cancer Neg Hx     Social History   Socioeconomic History  . Marital status: Widowed    Spouse name: Not on file  . Number of children: 2  . Years of education: 40  . Highest education level: Not on file  Occupational History  . Occupation: Investment banker, operational   Social Needs  . Financial resource strain: Not on file  . Food insecurity:    Worry: Not on file    Inability: Not on file  . Transportation needs:    Medical: Not on file    Non-medical: Not on file  Tobacco Use  . Smoking status: Never Smoker  . Smokeless tobacco: Never Used  Substance and Sexual Activity  . Alcohol use: No  . Drug use: No  . Sexual activity: Never  Lifestyle  . Physical activity:    Days per week: Not on file    Minutes per session: Not on file  . Stress: Not on file  Relationships  . Social connections:    Talks on phone: Not on file    Gets together:  Not on file    Attends religious service: Not on file    Active member of club or organization: Not on file    Attends meetings of clubs or organizations: Not on file    Relationship status: Not on file  . Intimate partner violence:    Fear of current or ex partner: Not on file    Emotionally abused: Not on file    Physically abused: Not on file    Forced sexual activity: Not on file  Other Topics Concern  . Not on file  Social History Narrative   Denies caffeine use     Review of Systems  Constitutional: Negative.   HENT: Negative.   Eyes: Negative.   Respiratory: Positive for cough.   Cardiovascular: Negative.   Gastrointestinal: Negative.   All other systems reviewed and are negative.   Vitals:   06/01/18 1127  BP: (!) 150/92  Pulse: 82  SpO2: 94%     Physical Exam  Constitutional: She appears well-developed and well-nourished. No distress.  HENT:  Head: Normocephalic and atraumatic.  Eyes: Pupils are equal, round, and reactive to light. Conjunctivae and EOM are normal. Right eye exhibits no discharge. Left eye exhibits no discharge.  Neck: Normal range of motion. Neck supple. No tracheal deviation present. No thyromegaly present.  Cardiovascular: Normal rate and regular rhythm.  Pulmonary/Chest: Effort normal and breath sounds normal. No respiratory distress. She has no wheezes. She has no rales.  Abdominal: Soft. Bowel sounds are normal. She exhibits no distension. There is no abdominal tenderness. There is no rebound.  Skin: She is not diaphoretic.   Data Reviewed: Chest x-ray from December showed mild vascular prominence Recent blood work reviewed  Assessment:  Chronic cough -Seems to be improving with recent treatment with bronchodilators and steroids -Has no underlying lung disease -No significant predisposition to lung disease  Chronic kidney disease -On hemodialysis -Previous transplant  Plan/Recommendations:  Symptoms appear  to be improving at  present We will obtain a repeat chest x-ray We will obtain a pulmonary function study  Depending on findings and depending on progression of symptoms may require CT scan of the chest  Continue lines of care    Sherrilyn Rist MD [facility] 06/01/2018, 11:43 AM  CC: Midge Minium, MD

## 2018-06-01 NOTE — Telephone Encounter (Signed)
[facility] radiology called for impression on patients cxr. Will route this over to AO.    IMPRESSION: 1. 9 x 7 mm nodular opacity lateral right base. Advise noncontrast enhanced chest CT to further evaluate.  2.  Atelectatic change in each mid lung.  No edema or consolidation.  3.  Stable cardiac silhouette.  These results will be called to the ordering clinician or representative by the Radiologist Assistant, and communication documented in the PACS or zVision Dashboard.   Electronically Signed   By: Lowella Grip III M.D.   On: 06/01/2018 16:26

## 2018-06-01 NOTE — Patient Instructions (Signed)
Cough with some improvement  No changes to be made at present  We will get a breathing study Repeat your chest x-ray  If any abnormality in the above tests, we will obtain a CT scan of the chest  Continue with albuterol use as needed  I will see you back in the office in about 4 to 6 weeks Call with any significant concerns

## 2018-06-02 DIAGNOSIS — D631 Anemia in chronic kidney disease: Secondary | ICD-10-CM | POA: Diagnosis not present

## 2018-06-02 DIAGNOSIS — N2581 Secondary hyperparathyroidism of renal origin: Secondary | ICD-10-CM | POA: Diagnosis not present

## 2018-06-02 DIAGNOSIS — N186 End stage renal disease: Secondary | ICD-10-CM | POA: Diagnosis not present

## 2018-06-02 DIAGNOSIS — E1129 Type 2 diabetes mellitus with other diabetic kidney complication: Secondary | ICD-10-CM | POA: Diagnosis not present

## 2018-06-02 DIAGNOSIS — D509 Iron deficiency anemia, unspecified: Secondary | ICD-10-CM | POA: Diagnosis not present

## 2018-06-02 NOTE — Telephone Encounter (Signed)
Call patient  Chest x-ray result shows  Haziness in the lungs need further clarification with CT chest  Order CT chest without contrast     Had already discussed with the patient during her office visit  that if her chest x-ray shows any abnormality we will proceed with a CT

## 2018-06-02 NOTE — Telephone Encounter (Signed)
Patient is aware of results and ct is order nothing further is needed.

## 2018-06-03 ENCOUNTER — Ambulatory Visit: Payer: Self-pay | Admitting: Hematology

## 2018-06-03 NOTE — Telephone Encounter (Signed)
Patient C/O of left side of face being "sunken" in. Denies any injuries to face.  She was sick 1 1/2 years ago with a Brain infection.  Patient states she has not mentioned this to the provider before.  She thinks it is because she needs dental work. / Broken tooth, and needs partial on top. No interference with eating, speaking or breathing / Patient has an appointment on 06-10-2018 already scheduled for blood in stool  Reason for Disposition . Mild tooth injury  Answer Assessment - Initial Assessment Questions 1. MECHANISM: "How did the injury happen?"      unsure 2. ONSET: "When did the injury happen?" (e.g., minutes or hours ago)      Per patient it happened a while ago 3. LOCATION: "What part of the tooth is injured?"      Right upper jaw 4. APPEARANCE: "What does the tooth look like?"    "sunken in" 5. BLEEDING: "Is the mouth still bleeding?" If so, ask: "Is it difficult to stop?"      no 6. PAIN: "Is it painful?" If so, ask: "How bad is the pain?"   (Scale 1-10; or mild, moderate, severe)  no  8. OTHER SYMPTOMS: "Do you have any other symptoms?"      no  Protocols used: TOOTH INJURY-A-AH

## 2018-06-04 DIAGNOSIS — N2581 Secondary hyperparathyroidism of renal origin: Secondary | ICD-10-CM | POA: Diagnosis not present

## 2018-06-04 DIAGNOSIS — N186 End stage renal disease: Secondary | ICD-10-CM | POA: Diagnosis not present

## 2018-06-04 DIAGNOSIS — D509 Iron deficiency anemia, unspecified: Secondary | ICD-10-CM | POA: Diagnosis not present

## 2018-06-04 DIAGNOSIS — D631 Anemia in chronic kidney disease: Secondary | ICD-10-CM | POA: Diagnosis not present

## 2018-06-04 DIAGNOSIS — E1129 Type 2 diabetes mellitus with other diabetic kidney complication: Secondary | ICD-10-CM | POA: Diagnosis not present

## 2018-06-07 DIAGNOSIS — D509 Iron deficiency anemia, unspecified: Secondary | ICD-10-CM | POA: Diagnosis not present

## 2018-06-07 DIAGNOSIS — D631 Anemia in chronic kidney disease: Secondary | ICD-10-CM | POA: Diagnosis not present

## 2018-06-07 DIAGNOSIS — E1129 Type 2 diabetes mellitus with other diabetic kidney complication: Secondary | ICD-10-CM | POA: Diagnosis not present

## 2018-06-07 DIAGNOSIS — N186 End stage renal disease: Secondary | ICD-10-CM | POA: Diagnosis not present

## 2018-06-07 DIAGNOSIS — N2581 Secondary hyperparathyroidism of renal origin: Secondary | ICD-10-CM | POA: Diagnosis not present

## 2018-06-09 DIAGNOSIS — N2581 Secondary hyperparathyroidism of renal origin: Secondary | ICD-10-CM | POA: Diagnosis not present

## 2018-06-09 DIAGNOSIS — D631 Anemia in chronic kidney disease: Secondary | ICD-10-CM | POA: Diagnosis not present

## 2018-06-09 DIAGNOSIS — E1129 Type 2 diabetes mellitus with other diabetic kidney complication: Secondary | ICD-10-CM | POA: Diagnosis not present

## 2018-06-09 DIAGNOSIS — N186 End stage renal disease: Secondary | ICD-10-CM | POA: Diagnosis not present

## 2018-06-09 DIAGNOSIS — D509 Iron deficiency anemia, unspecified: Secondary | ICD-10-CM | POA: Diagnosis not present

## 2018-06-10 ENCOUNTER — Ambulatory Visit (INDEPENDENT_AMBULATORY_CARE_PROVIDER_SITE_OTHER): Payer: Medicare Other | Admitting: Family Medicine

## 2018-06-10 ENCOUNTER — Encounter: Payer: Self-pay | Admitting: Family Medicine

## 2018-06-10 ENCOUNTER — Other Ambulatory Visit: Payer: Self-pay

## 2018-06-10 VITALS — BP 149/92 | HR 80 | Temp 98.6°F | Resp 18 | Ht 66.0 in | Wt 178.4 lb

## 2018-06-10 DIAGNOSIS — I10 Essential (primary) hypertension: Secondary | ICD-10-CM

## 2018-06-10 DIAGNOSIS — Q670 Congenital facial asymmetry: Secondary | ICD-10-CM

## 2018-06-10 DIAGNOSIS — G0481 Other encephalitis and encephalomyelitis: Secondary | ICD-10-CM | POA: Diagnosis not present

## 2018-06-10 DIAGNOSIS — K649 Unspecified hemorrhoids: Secondary | ICD-10-CM | POA: Diagnosis not present

## 2018-06-10 MED ORDER — AMLODIPINE BESYLATE 10 MG PO TABS: 10.0000 mg | ORAL_TABLET | Freq: Every day | ORAL | 3 refills | Status: DC

## 2018-06-10 NOTE — Patient Instructions (Signed)
Follow up as scheduled We'll call you with your Neuro appt INCREASE the Amlodipine to 10mg  daily- 1 of the new prescription Please monitor your stool and let me know if there is any more blood Call with any questions or concerns STAY SAFE!

## 2018-06-10 NOTE — Assessment & Plan Note (Signed)
Pt had 1 episode of BRBPR ~2 weeks ago after a particularly hard BM.  No recurrent bleeding and BMs have been WNL.  Pt to monitor sxs and let me know if bleeding recurs.  Pt expressed understanding and is in agreement w/ plan.

## 2018-06-10 NOTE — Progress Notes (Signed)
   Subjective:    Patient ID: [name] [name], female    DOB: 12-01-1955, 63 y.o.   MRN: [id number]  HPI HTN- chronic problem, on Amlodipine 5mg  daily.  BP is elevated today and was on 3/10 as well.  Pt reports she is eating pork which is not typical for her.  No CP, SOB, HAs, visual changes.  Blood in stool- pt had blood in stool ~1-2 weeks ago.  This occurred after a hard to pass bowel movement.  Blood was on the toilet tissue, not in the bowl.  Has had multiple normal BMs since w/o bleeding.    Facial asymmetry- pt feels that L side of face 'sinks in' when she is speaking compared to the R side.  This is not new.  Noticed after extensive hospitalization w/ intubation and neuro infxn.  Pt would like to resume neurologic care back at [facility].   Review of Systems For ROS see HPI     Objective:   Physical Exam Vitals signs reviewed.  Constitutional:      General: She is not in acute distress.    Appearance: She is well-developed.  HENT:     Head: Normocephalic and atraumatic.  Eyes:     Conjunctiva/sclera: Conjunctivae normal.     Pupils: Pupils are equal, round, and reactive to light.  Neck:     Musculoskeletal: Normal range of motion and neck supple.     Thyroid: No thyromegaly.  Cardiovascular:     Rate and Rhythm: Normal rate and regular rhythm.     Heart sounds: Normal heart sounds. No murmur.  Pulmonary:     Effort: Pulmonary effort is normal. No respiratory distress.     Breath sounds: Normal breath sounds.  Abdominal:     General: There is no distension.     Palpations: Abdomen is soft.     Tenderness: There is no abdominal tenderness.  Lymphadenopathy:     Cervical: No cervical adenopathy.  Skin:    General: Skin is warm and dry.  Neurological:     Mental Status: She is alert and oriented to person, place, and time.     Comments: Asymmetry of face w/ mild L facial drooping  Psychiatric:        Behavior: Behavior normal.           Assessment & Plan:   Facial asymmetry- this has been present since pt's prolonged hospitalization w/ encephalitis and CVAs.  Reassured her that this is unlikely to change.  Will refer back to [facility]

## 2018-06-10 NOTE — Assessment & Plan Note (Signed)
Pt needs neuro f/u but would prefer to go back to [facility] rather than be seen in [place].  Referral placed.

## 2018-06-10 NOTE — Assessment & Plan Note (Signed)
Deteriorated.  BP is elevated today.  Asymptomatic.  Increase Amlodipine to 10mg  daily.  Will monitor for improvement.

## 2018-06-11 DIAGNOSIS — E1129 Type 2 diabetes mellitus with other diabetic kidney complication: Secondary | ICD-10-CM | POA: Diagnosis not present

## 2018-06-11 DIAGNOSIS — D509 Iron deficiency anemia, unspecified: Secondary | ICD-10-CM | POA: Diagnosis not present

## 2018-06-11 DIAGNOSIS — N186 End stage renal disease: Secondary | ICD-10-CM | POA: Diagnosis not present

## 2018-06-11 DIAGNOSIS — N2581 Secondary hyperparathyroidism of renal origin: Secondary | ICD-10-CM | POA: Diagnosis not present

## 2018-06-11 DIAGNOSIS — D631 Anemia in chronic kidney disease: Secondary | ICD-10-CM | POA: Diagnosis not present

## 2018-06-14 DIAGNOSIS — N186 End stage renal disease: Secondary | ICD-10-CM | POA: Diagnosis not present

## 2018-06-14 DIAGNOSIS — D509 Iron deficiency anemia, unspecified: Secondary | ICD-10-CM | POA: Diagnosis not present

## 2018-06-14 DIAGNOSIS — N2581 Secondary hyperparathyroidism of renal origin: Secondary | ICD-10-CM | POA: Diagnosis not present

## 2018-06-14 DIAGNOSIS — D631 Anemia in chronic kidney disease: Secondary | ICD-10-CM | POA: Diagnosis not present

## 2018-06-14 DIAGNOSIS — E1129 Type 2 diabetes mellitus with other diabetic kidney complication: Secondary | ICD-10-CM | POA: Diagnosis not present

## 2018-06-16 DIAGNOSIS — E1129 Type 2 diabetes mellitus with other diabetic kidney complication: Secondary | ICD-10-CM | POA: Diagnosis not present

## 2018-06-16 DIAGNOSIS — N186 End stage renal disease: Secondary | ICD-10-CM | POA: Diagnosis not present

## 2018-06-16 DIAGNOSIS — D509 Iron deficiency anemia, unspecified: Secondary | ICD-10-CM | POA: Diagnosis not present

## 2018-06-16 DIAGNOSIS — D631 Anemia in chronic kidney disease: Secondary | ICD-10-CM | POA: Diagnosis not present

## 2018-06-16 DIAGNOSIS — N2581 Secondary hyperparathyroidism of renal origin: Secondary | ICD-10-CM | POA: Diagnosis not present

## 2018-06-18 DIAGNOSIS — N186 End stage renal disease: Secondary | ICD-10-CM | POA: Diagnosis not present

## 2018-06-18 DIAGNOSIS — E1129 Type 2 diabetes mellitus with other diabetic kidney complication: Secondary | ICD-10-CM | POA: Diagnosis not present

## 2018-06-18 DIAGNOSIS — D631 Anemia in chronic kidney disease: Secondary | ICD-10-CM | POA: Diagnosis not present

## 2018-06-18 DIAGNOSIS — D509 Iron deficiency anemia, unspecified: Secondary | ICD-10-CM | POA: Diagnosis not present

## 2018-06-18 DIAGNOSIS — N2581 Secondary hyperparathyroidism of renal origin: Secondary | ICD-10-CM | POA: Diagnosis not present

## 2018-06-21 DIAGNOSIS — D631 Anemia in chronic kidney disease: Secondary | ICD-10-CM | POA: Diagnosis not present

## 2018-06-21 DIAGNOSIS — N186 End stage renal disease: Secondary | ICD-10-CM | POA: Diagnosis not present

## 2018-06-21 DIAGNOSIS — D509 Iron deficiency anemia, unspecified: Secondary | ICD-10-CM | POA: Diagnosis not present

## 2018-06-21 DIAGNOSIS — E1129 Type 2 diabetes mellitus with other diabetic kidney complication: Secondary | ICD-10-CM | POA: Diagnosis not present

## 2018-06-21 DIAGNOSIS — N2581 Secondary hyperparathyroidism of renal origin: Secondary | ICD-10-CM | POA: Diagnosis not present

## 2018-06-22 DIAGNOSIS — M65332 Trigger finger, left middle finger: Secondary | ICD-10-CM | POA: Diagnosis not present

## 2018-06-23 ENCOUNTER — Telehealth (INDEPENDENT_AMBULATORY_CARE_PROVIDER_SITE_OTHER): Payer: Self-pay

## 2018-06-23 DIAGNOSIS — I150 Renovascular hypertension: Secondary | ICD-10-CM | POA: Diagnosis not present

## 2018-06-23 DIAGNOSIS — Z992 Dependence on renal dialysis: Secondary | ICD-10-CM | POA: Diagnosis not present

## 2018-06-23 DIAGNOSIS — Z23 Encounter for immunization: Secondary | ICD-10-CM | POA: Diagnosis not present

## 2018-06-23 DIAGNOSIS — D509 Iron deficiency anemia, unspecified: Secondary | ICD-10-CM | POA: Diagnosis not present

## 2018-06-23 DIAGNOSIS — N2581 Secondary hyperparathyroidism of renal origin: Secondary | ICD-10-CM | POA: Diagnosis not present

## 2018-06-23 DIAGNOSIS — E1129 Type 2 diabetes mellitus with other diabetic kidney complication: Secondary | ICD-10-CM | POA: Diagnosis not present

## 2018-06-23 DIAGNOSIS — N186 End stage renal disease: Secondary | ICD-10-CM | POA: Diagnosis not present

## 2018-06-23 NOTE — Telephone Encounter (Signed)
    Called patient. No answer LMOM to return our call. If they return call, please ask them screening questions below. Thank you.   Do you have now or have you had in the past 7 days a fever and/or chills?   Do you have now or have you had in the past 7 days a cough?   Do you have now or have you had in the last 7 days nausea, vomiting or abdominal pain?   Have you been exposed to anyone who has tested positive for COVID-19?   Have you or anyone who lives with you traveled within the last month?

## 2018-06-24 ENCOUNTER — Ambulatory Visit (INDEPENDENT_AMBULATORY_CARE_PROVIDER_SITE_OTHER): Payer: Medicare Other | Admitting: Orthopaedic Surgery

## 2018-06-24 ENCOUNTER — Other Ambulatory Visit: Payer: Self-pay

## 2018-06-24 ENCOUNTER — Ambulatory Visit (INDEPENDENT_AMBULATORY_CARE_PROVIDER_SITE_OTHER): Payer: Medicare Other

## 2018-06-24 ENCOUNTER — Ambulatory Visit: Payer: Self-pay | Admitting: Family Medicine

## 2018-06-24 ENCOUNTER — Encounter (INDEPENDENT_AMBULATORY_CARE_PROVIDER_SITE_OTHER): Payer: Self-pay | Admitting: Orthopaedic Surgery

## 2018-06-24 DIAGNOSIS — M545 Low back pain, unspecified: Secondary | ICD-10-CM

## 2018-06-24 DIAGNOSIS — M1612 Unilateral primary osteoarthritis, left hip: Secondary | ICD-10-CM

## 2018-06-24 MED ORDER — METHYLPREDNISOLONE 4 MG PO TBPK: ORAL_TABLET | ORAL | 0 refills | Status: DC

## 2018-06-24 NOTE — Progress Notes (Signed)
Office Visit Note   Patient: [name] [name]           Date of Birth: Feb 01, 1956           MRN: [id number] Visit Date: 06/24/2018              Requested by: Midge Minium, MD [address] [place], [place] [address] PCP: Midge Minium, MD   Assessment & Plan: Visit Diagnoses:  1. Low back pain, unspecified back pain laterality, unspecified chronicity, unspecified whether sciatica present   2. Primary osteoarthritis of left hip     Plan: Unfortunately she did not receive any significant relief from the cortisone injection beyond the anesthetic.  I think her low back pain is due to compensation from her hip pain.  Again we discussed treatments such as a total hip replacement which she will think about that right now it does not sound like she is interested in that.  We did provide her with information on hip replacement surgery.  Questions encouraged and answered.  Prescription for Medrol Dosepak provided today.  She is not able to take NSAIDs due to end-stage renal disease.  Follow-Up Instructions: Return if symptoms worsen or fail to improve.   Orders:  Orders Placed This Encounter  Procedures  . XR Lumbar Spine 2-3 Views   Meds ordered this encounter  Medications  . methylPREDNISolone (MEDROL DOSEPAK) 4 MG TBPK tablet    Sig: Use as directed    Dispense:  21 tablet    Refill:  0      Procedures: No procedures performed   Clinical Data: No additional findings.   Subjective: Chief Complaint  Patient presents with  . Left Hip - Pain    [name] [name] comes in today for follow-up of her left hip osteoarthritis as well as recent development of low back pain.  Denies any radicular symptoms.  She states that the injection in her hip helped during the anesthetic phase but she never felt any long-term relief from the injection.  She continues to complain of left hip and groin pain and she has difficulty moving her leg around and she is getting out of the car.  She  is end-stage renal disease on hemodialysis.   Review of Systems  Constitutional: Negative.   HENT: Negative.   Eyes: Negative.   Respiratory: Negative.   Cardiovascular: Negative.   Endocrine: Negative.   Musculoskeletal: Negative.   Neurological: Negative.   Hematological: Negative.   Psychiatric/Behavioral: Negative.   All other systems reviewed and are negative.    Objective: Vital Signs: There were no vitals taken for this visit.  Physical Exam Vitals signs and nursing note reviewed.  Constitutional:      Appearance: She is well-developed.  Pulmonary:     Effort: Pulmonary effort is normal.  Skin:    General: Skin is warm.     Capillary Refill: Capillary refill takes less than 2 seconds.  Neurological:     Mental Status: She is alert and oriented to person, place, and time.  Psychiatric:        Behavior: Behavior normal.        Thought Content: Thought content normal.        Judgment: Judgment normal.     Ortho Exam Left hip exam shows positive FADIR.  Positive Stinchfield sign.  Trochanteric bursa is nontender.  Lumbar spine is mildly tender. Specialty Comments:  No specialty comments available.  Imaging: Xr Lumbar Spine 2-3 Views  Result Date: 06/24/2018 Preservation of lumbar lordosis.  Disc spaces appear to be preserved.    PMFS History: Patient Active Problem List   Diagnosis Date Noted  . Stress incontinence 12/24/2017  . Cough variant asthma 12/24/2017  . Tremor of left hand 12/24/2017  . History of internal jugular thrombosis 03/26/2017  . 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 Chronic diastolic CHF (congestive heart failure) ([place]) 07/08/2016  . Anemia 07/08/2016  . Abnormal brain MRI 07/03/2016  . Fasciculation 06/18/2016  . Carotid artery disease ([place])   . Physical exam 01/28/2016  . Hyperlipidemia 06/28/2015  . Diabetes mellitus type II, controlled ([place]) 06/28/2015  . Thoracic ascending aortic  aneurysm (41 mm on Echo 06/2013) 07/06/2013  . ESRD on dialysis ([place]) 06/16/2013  . History of renal transplant 06/16/2013  . GERD (gastroesophageal reflux disease) 08/13/2009  . OTH&UNSPEC NONINFECTIOUS GASTROENTERITIS&COLITIS 08/13/2009  . Essential hypertension 04/25/2009  . Hemorrhoids 04/25/2009  . WEIGHT LOSS 04/25/2009  . NAUSEA AND VOMITING 04/25/2009  . PERSONAL HX COLONIC POLYPS 04/25/2009   Past Medical History:  Diagnosis Date  . Adenomatous colon polyp 04/1998  . Anemia   . Anxiety   . Arthritis   . Carotid artery disease ([place])    Carotid US 1/18: bilateral ICA 1-39, R thyroid lobe nodule (1.9x2.2x3cm); numerous L thyroid lobe nodules - repeat 1 year  . Chronic kidney disease    M/[facility]  . Chronic renal failure    post transplant  . Depression   . Diabetes mellitus   . EBV infection    Per [facility]  . Encephalitis    NMDA per [facility]  . Esophagitis    Grade 1 Distal  . Focal seizure ([facility])    .  Last one 06/2016  . GERD (gastroesophageal reflux disease)   . Hemorrhoids   . History of kidney stones    Passed  . History of subdural hematoma   . Hx of cardiovascular stress test    Lexiscan Myoview (06/2013):  No ischemia, EF 66%; normal.  //  Myoview 12/17: EF 62, no ischemia or scar; Normal  . Hx of echocardiogram    a. Echocardiogram (06/2013):  Mod focal basal hypertrophy, EF 60-65%, normal wall motion, Gr 1 DD, mild AI, mildly dilated ascending aorta (41 mm), mild LAE.; b.  Echo 9/16: mod LVH, EF 60-65%, no RWMA, Gr 1 DD, trivial AI, mild dilated ascending aorta, mild LAE  . Hyperkalemia   . Hyperlipidemia   . Hypertension   . Hypomagnesemia   . Left jugular vein thrombosis    Partial resolved per [facility]  . Metabolic acidosis   . Pneumonia   . Right jugular vein thrombosis    resolved from [facility]  . Seizures ([place])     Family History  Problem Relation Age of Onset  . Kidney disease Paternal Aunt   . Heart disease Mother   . Heart disease Father   . Colon  cancer Neg Hx     Past Surgical History:  Procedure Laterality Date  . A/V FISTULAGRAM Left 06/04/2017   Procedure: A/V FISTULAGRAM;  Surgeon: Conrad Penelope, MD;  Location: [facility];  Service: Cardiovascular;  Laterality: Left;  . ABDOMINAL HYSTERECTOMY    . AV FISTULA PLACEMENT  07/04/2005   Cimino AV fistula  . AV FISTULA PLACEMENT  08/27/2005  . AV FISTULA PLACEMENT Left 04/29/2017   Procedure: Creation of Left Arm ARTERIOVENOUS BRACHIOCEPHALIC FISTULA;  Surgeon: Elam Dutch, MD;  Location: [facility];  Service: Vascular;  Laterality: Left;  . AV FISTULA PLACEMENT Left 06/16/2017   Procedure: BRACHIO_BASCILIC ARTERIOVENOUS (AV) FISTULA CREATION;  Surgeon: Waynetta Sandy, MD;  Location: [facility] [place];  Service: Vascular;  Laterality: Left;  . AV FISTULA PLACEMENT W/ PTFE  08/27/2005  . BASCILIC VEIN TRANSPOSITION Left 06/16/2017   Procedure: BRACHIOCEPHALIC TRANSPOSITION  LEFT ARM;  Surgeon: Waynetta Sandy, MD;  Location: [facility];  Service: Vascular;  Laterality: Left;  . BASCILIC VEIN TRANSPOSITION Left 09/01/2017   Procedure: BASILIC VEIN TRANSPOSITION SECOND STAGE;  Surgeon: Waynetta Sandy, MD;  Location: [facility];  Service: Vascular;  Laterality: Left;  . BRAIN BIOPSY    . CESAREAN SECTION    . DG AV DIALYSIS GRAFT DECLOT OR  07/24/2005   AV Gore-Tex graf  . DG AV DIALYSIS GRAFT DECLOT OR  Thrombosis right forearm, loop arteriovenous   Thrombosis right forearm, loop arteriovenous graft  . GASTROSTOMY TUBE PLACEMENT    . KIDNEY TRANSPLANT  2009   Both  . REMOVAL OF GASTROSTOMY TUBE    . THROMBECTOMY / ARTERIOVENOUS GRAFT REVISION  10/12/2006  . THROMBECTOMY / ARTERIOVENOUS GRAFT REVISION  10/16/2006   Social History   Occupational History  . Occupation: Investment banker, operational   Tobacco Use  . Smoking status: Never Smoker  . Smokeless tobacco: Never Used  Substance and Sexual Activity  . Alcohol use: No  . Drug use: No  . Sexual activity: Never

## 2018-06-25 DIAGNOSIS — D509 Iron deficiency anemia, unspecified: Secondary | ICD-10-CM | POA: Diagnosis not present

## 2018-06-25 DIAGNOSIS — Z23 Encounter for immunization: Secondary | ICD-10-CM | POA: Diagnosis not present

## 2018-06-25 DIAGNOSIS — N2581 Secondary hyperparathyroidism of renal origin: Secondary | ICD-10-CM | POA: Diagnosis not present

## 2018-06-25 DIAGNOSIS — N186 End stage renal disease: Secondary | ICD-10-CM | POA: Diagnosis not present

## 2018-06-25 DIAGNOSIS — E1129 Type 2 diabetes mellitus with other diabetic kidney complication: Secondary | ICD-10-CM | POA: Diagnosis not present

## 2018-06-28 ENCOUNTER — Other Ambulatory Visit: Payer: Self-pay

## 2018-06-28 DIAGNOSIS — Z23 Encounter for immunization: Secondary | ICD-10-CM | POA: Diagnosis not present

## 2018-06-28 DIAGNOSIS — D509 Iron deficiency anemia, unspecified: Secondary | ICD-10-CM | POA: Diagnosis not present

## 2018-06-28 DIAGNOSIS — E1129 Type 2 diabetes mellitus with other diabetic kidney complication: Secondary | ICD-10-CM | POA: Diagnosis not present

## 2018-06-28 DIAGNOSIS — N2581 Secondary hyperparathyroidism of renal origin: Secondary | ICD-10-CM | POA: Diagnosis not present

## 2018-06-28 DIAGNOSIS — N186 End stage renal disease: Secondary | ICD-10-CM | POA: Diagnosis not present

## 2018-06-29 ENCOUNTER — Telehealth: Payer: Self-pay | Admitting: *Deleted

## 2018-06-30 DIAGNOSIS — D509 Iron deficiency anemia, unspecified: Secondary | ICD-10-CM | POA: Diagnosis not present

## 2018-06-30 DIAGNOSIS — N2581 Secondary hyperparathyroidism of renal origin: Secondary | ICD-10-CM | POA: Diagnosis not present

## 2018-06-30 DIAGNOSIS — N186 End stage renal disease: Secondary | ICD-10-CM | POA: Diagnosis not present

## 2018-06-30 DIAGNOSIS — Z23 Encounter for immunization: Secondary | ICD-10-CM | POA: Diagnosis not present

## 2018-06-30 DIAGNOSIS — E1129 Type 2 diabetes mellitus with other diabetic kidney complication: Secondary | ICD-10-CM | POA: Diagnosis not present

## 2018-07-01 NOTE — Telephone Encounter (Signed)
LMTCB about upcoming CT appt.

## 2018-07-02 ENCOUNTER — Telehealth: Payer: Self-pay | Admitting: *Deleted

## 2018-07-02 DIAGNOSIS — D509 Iron deficiency anemia, unspecified: Secondary | ICD-10-CM | POA: Diagnosis not present

## 2018-07-02 DIAGNOSIS — N186 End stage renal disease: Secondary | ICD-10-CM | POA: Diagnosis not present

## 2018-07-02 DIAGNOSIS — Z23 Encounter for immunization: Secondary | ICD-10-CM | POA: Diagnosis not present

## 2018-07-02 DIAGNOSIS — N2581 Secondary hyperparathyroidism of renal origin: Secondary | ICD-10-CM | POA: Diagnosis not present

## 2018-07-02 DIAGNOSIS — E1129 Type 2 diabetes mellitus with other diabetic kidney complication: Secondary | ICD-10-CM | POA: Diagnosis not present

## 2018-07-02 NOTE — Telephone Encounter (Signed)
Covid-19 travel screening questions  Have you traveled in the last 14 days? If yes where? NO  Do you now or have you had a fever in the last 14 days? No  Do you have any respiratory symptoms of shortness of breath or cough now or in the last 14 days? No  Do you have any family members or close contacts with diagnosed or suspected Covid-19?  No

## 2018-07-05 ENCOUNTER — Other Ambulatory Visit: Payer: Self-pay

## 2018-07-05 ENCOUNTER — Ambulatory Visit (INDEPENDENT_AMBULATORY_CARE_PROVIDER_SITE_OTHER)
Admission: RE | Admit: 2018-07-05 | Discharge: 2018-07-05 | Disposition: A | Discharge status: Home / Self Care | Payer: Medicare Other | Source: Ambulatory Visit | Attending: Pulmonary Disease | Admitting: Pulmonary Disease

## 2018-07-05 DIAGNOSIS — R911 Solitary pulmonary nodule: Secondary | ICD-10-CM | POA: Diagnosis not present

## 2018-07-05 DIAGNOSIS — R918 Other nonspecific abnormal finding of lung field: Secondary | ICD-10-CM | POA: Diagnosis not present

## 2018-07-05 DIAGNOSIS — D509 Iron deficiency anemia, unspecified: Secondary | ICD-10-CM | POA: Diagnosis not present

## 2018-07-05 DIAGNOSIS — Z23 Encounter for immunization: Secondary | ICD-10-CM | POA: Diagnosis not present

## 2018-07-05 DIAGNOSIS — N186 End stage renal disease: Secondary | ICD-10-CM | POA: Diagnosis not present

## 2018-07-05 DIAGNOSIS — E1129 Type 2 diabetes mellitus with other diabetic kidney complication: Secondary | ICD-10-CM | POA: Diagnosis not present

## 2018-07-05 DIAGNOSIS — N2581 Secondary hyperparathyroidism of renal origin: Secondary | ICD-10-CM | POA: Diagnosis not present

## 2018-07-06 ENCOUNTER — Other Ambulatory Visit: Payer: Self-pay | Admitting: Family Medicine

## 2018-07-06 ENCOUNTER — Inpatient Hospital Stay: Admission: RE | Admit: 2018-07-06 | Payer: Self-pay | Source: Ambulatory Visit

## 2018-07-07 DIAGNOSIS — N2581 Secondary hyperparathyroidism of renal origin: Secondary | ICD-10-CM | POA: Diagnosis not present

## 2018-07-07 DIAGNOSIS — Z23 Encounter for immunization: Secondary | ICD-10-CM | POA: Diagnosis not present

## 2018-07-07 DIAGNOSIS — D509 Iron deficiency anemia, unspecified: Secondary | ICD-10-CM | POA: Diagnosis not present

## 2018-07-07 DIAGNOSIS — E1129 Type 2 diabetes mellitus with other diabetic kidney complication: Secondary | ICD-10-CM | POA: Diagnosis not present

## 2018-07-07 DIAGNOSIS — N186 End stage renal disease: Secondary | ICD-10-CM | POA: Diagnosis not present

## 2018-07-09 DIAGNOSIS — N2581 Secondary hyperparathyroidism of renal origin: Secondary | ICD-10-CM | POA: Diagnosis not present

## 2018-07-09 DIAGNOSIS — Z23 Encounter for immunization: Secondary | ICD-10-CM | POA: Diagnosis not present

## 2018-07-09 DIAGNOSIS — D509 Iron deficiency anemia, unspecified: Secondary | ICD-10-CM | POA: Diagnosis not present

## 2018-07-09 DIAGNOSIS — N186 End stage renal disease: Secondary | ICD-10-CM | POA: Diagnosis not present

## 2018-07-09 DIAGNOSIS — E1129 Type 2 diabetes mellitus with other diabetic kidney complication: Secondary | ICD-10-CM | POA: Diagnosis not present

## 2018-07-12 DIAGNOSIS — Z23 Encounter for immunization: Secondary | ICD-10-CM | POA: Diagnosis not present

## 2018-07-12 DIAGNOSIS — D509 Iron deficiency anemia, unspecified: Secondary | ICD-10-CM | POA: Diagnosis not present

## 2018-07-12 DIAGNOSIS — N186 End stage renal disease: Secondary | ICD-10-CM | POA: Diagnosis not present

## 2018-07-12 DIAGNOSIS — E1129 Type 2 diabetes mellitus with other diabetic kidney complication: Secondary | ICD-10-CM | POA: Diagnosis not present

## 2018-07-12 DIAGNOSIS — N2581 Secondary hyperparathyroidism of renal origin: Secondary | ICD-10-CM | POA: Diagnosis not present

## 2018-07-13 ENCOUNTER — Ambulatory Visit: Payer: Self-pay | Admitting: Pulmonary Disease

## 2018-07-13 ENCOUNTER — Telehealth: Payer: Self-pay | Admitting: Family Medicine

## 2018-07-13 NOTE — Telephone Encounter (Signed)
Patient is having a dry cough x 1 week. No fevers, checked at dialysis, usually between 98.4-99. Was seen in February for cough and started taking allergy medication. Still taking allergy meds but is no longer helping cough.

## 2018-07-13 NOTE — Telephone Encounter (Signed)
Copied from [place] [id number]. Topic: General - Other >> Jul 13, 2018 12:31 PM Gustavus Messing wrote: Reason for CRM: Patient wants Dr. Birdie Riddle to know that her couch has returned and she would like her Dr. Jaymes Graff a nurse to give her a call back [phone]

## 2018-07-13 NOTE — Telephone Encounter (Signed)
Please have pt schedule video visit

## 2018-07-14 DIAGNOSIS — D509 Iron deficiency anemia, unspecified: Secondary | ICD-10-CM | POA: Diagnosis not present

## 2018-07-14 DIAGNOSIS — E1129 Type 2 diabetes mellitus with other diabetic kidney complication: Secondary | ICD-10-CM | POA: Diagnosis not present

## 2018-07-14 DIAGNOSIS — N2581 Secondary hyperparathyroidism of renal origin: Secondary | ICD-10-CM | POA: Diagnosis not present

## 2018-07-14 DIAGNOSIS — N186 End stage renal disease: Secondary | ICD-10-CM | POA: Diagnosis not present

## 2018-07-14 DIAGNOSIS — Z23 Encounter for immunization: Secondary | ICD-10-CM | POA: Diagnosis not present

## 2018-07-14 NOTE — Telephone Encounter (Signed)
LMOVM for patient to call back to schedule appt

## 2018-07-16 DIAGNOSIS — E1129 Type 2 diabetes mellitus with other diabetic kidney complication: Secondary | ICD-10-CM | POA: Diagnosis not present

## 2018-07-16 DIAGNOSIS — Z23 Encounter for immunization: Secondary | ICD-10-CM | POA: Diagnosis not present

## 2018-07-16 DIAGNOSIS — D509 Iron deficiency anemia, unspecified: Secondary | ICD-10-CM | POA: Diagnosis not present

## 2018-07-16 DIAGNOSIS — N2581 Secondary hyperparathyroidism of renal origin: Secondary | ICD-10-CM | POA: Diagnosis not present

## 2018-07-16 DIAGNOSIS — N186 End stage renal disease: Secondary | ICD-10-CM | POA: Diagnosis not present

## 2018-07-19 DIAGNOSIS — E1129 Type 2 diabetes mellitus with other diabetic kidney complication: Secondary | ICD-10-CM | POA: Diagnosis not present

## 2018-07-19 DIAGNOSIS — N186 End stage renal disease: Secondary | ICD-10-CM | POA: Diagnosis not present

## 2018-07-19 DIAGNOSIS — Z23 Encounter for immunization: Secondary | ICD-10-CM | POA: Diagnosis not present

## 2018-07-19 DIAGNOSIS — N2581 Secondary hyperparathyroidism of renal origin: Secondary | ICD-10-CM | POA: Diagnosis not present

## 2018-07-19 DIAGNOSIS — D509 Iron deficiency anemia, unspecified: Secondary | ICD-10-CM | POA: Diagnosis not present

## 2018-07-21 DIAGNOSIS — D509 Iron deficiency anemia, unspecified: Secondary | ICD-10-CM | POA: Diagnosis not present

## 2018-07-21 DIAGNOSIS — N2581 Secondary hyperparathyroidism of renal origin: Secondary | ICD-10-CM | POA: Diagnosis not present

## 2018-07-21 DIAGNOSIS — N186 End stage renal disease: Secondary | ICD-10-CM | POA: Diagnosis not present

## 2018-07-21 DIAGNOSIS — E1129 Type 2 diabetes mellitus with other diabetic kidney complication: Secondary | ICD-10-CM | POA: Diagnosis not present

## 2018-07-21 DIAGNOSIS — Z23 Encounter for immunization: Secondary | ICD-10-CM | POA: Diagnosis not present

## 2018-07-23 DIAGNOSIS — N186 End stage renal disease: Secondary | ICD-10-CM | POA: Diagnosis not present

## 2018-07-23 DIAGNOSIS — Z992 Dependence on renal dialysis: Secondary | ICD-10-CM | POA: Diagnosis not present

## 2018-07-23 DIAGNOSIS — N2581 Secondary hyperparathyroidism of renal origin: Secondary | ICD-10-CM | POA: Diagnosis not present

## 2018-07-23 DIAGNOSIS — I150 Renovascular hypertension: Secondary | ICD-10-CM | POA: Diagnosis not present

## 2018-07-23 DIAGNOSIS — E1129 Type 2 diabetes mellitus with other diabetic kidney complication: Secondary | ICD-10-CM | POA: Diagnosis not present

## 2018-07-26 DIAGNOSIS — E1129 Type 2 diabetes mellitus with other diabetic kidney complication: Secondary | ICD-10-CM | POA: Diagnosis not present

## 2018-07-26 DIAGNOSIS — N2581 Secondary hyperparathyroidism of renal origin: Secondary | ICD-10-CM | POA: Diagnosis not present

## 2018-07-26 DIAGNOSIS — N186 End stage renal disease: Secondary | ICD-10-CM | POA: Diagnosis not present

## 2018-07-27 DIAGNOSIS — M65332 Trigger finger, left middle finger: Secondary | ICD-10-CM | POA: Diagnosis not present

## 2018-07-28 DIAGNOSIS — N2581 Secondary hyperparathyroidism of renal origin: Secondary | ICD-10-CM | POA: Diagnosis not present

## 2018-07-28 DIAGNOSIS — N186 End stage renal disease: Secondary | ICD-10-CM | POA: Diagnosis not present

## 2018-07-28 DIAGNOSIS — E1129 Type 2 diabetes mellitus with other diabetic kidney complication: Secondary | ICD-10-CM | POA: Diagnosis not present

## 2018-07-30 DIAGNOSIS — N2581 Secondary hyperparathyroidism of renal origin: Secondary | ICD-10-CM | POA: Diagnosis not present

## 2018-07-30 DIAGNOSIS — E1129 Type 2 diabetes mellitus with other diabetic kidney complication: Secondary | ICD-10-CM | POA: Diagnosis not present

## 2018-07-30 DIAGNOSIS — N186 End stage renal disease: Secondary | ICD-10-CM | POA: Diagnosis not present

## 2018-08-02 DIAGNOSIS — E1129 Type 2 diabetes mellitus with other diabetic kidney complication: Secondary | ICD-10-CM | POA: Diagnosis not present

## 2018-08-02 DIAGNOSIS — N2581 Secondary hyperparathyroidism of renal origin: Secondary | ICD-10-CM | POA: Diagnosis not present

## 2018-08-02 DIAGNOSIS — N186 End stage renal disease: Secondary | ICD-10-CM | POA: Diagnosis not present

## 2018-08-04 DIAGNOSIS — N2581 Secondary hyperparathyroidism of renal origin: Secondary | ICD-10-CM | POA: Diagnosis not present

## 2018-08-04 DIAGNOSIS — E1129 Type 2 diabetes mellitus with other diabetic kidney complication: Secondary | ICD-10-CM | POA: Diagnosis not present

## 2018-08-04 DIAGNOSIS — N186 End stage renal disease: Secondary | ICD-10-CM | POA: Diagnosis not present

## 2018-08-06 DIAGNOSIS — E1129 Type 2 diabetes mellitus with other diabetic kidney complication: Secondary | ICD-10-CM | POA: Diagnosis not present

## 2018-08-06 DIAGNOSIS — N2581 Secondary hyperparathyroidism of renal origin: Secondary | ICD-10-CM | POA: Diagnosis not present

## 2018-08-06 DIAGNOSIS — N186 End stage renal disease: Secondary | ICD-10-CM | POA: Diagnosis not present

## 2018-08-09 DIAGNOSIS — E1129 Type 2 diabetes mellitus with other diabetic kidney complication: Secondary | ICD-10-CM | POA: Diagnosis not present

## 2018-08-09 DIAGNOSIS — N186 End stage renal disease: Secondary | ICD-10-CM | POA: Diagnosis not present

## 2018-08-09 DIAGNOSIS — N2581 Secondary hyperparathyroidism of renal origin: Secondary | ICD-10-CM | POA: Diagnosis not present

## 2018-08-11 ENCOUNTER — Other Ambulatory Visit: Payer: Self-pay

## 2018-08-11 ENCOUNTER — Encounter (HOSPITAL_COMMUNITY): Payer: Self-pay | Admitting: Emergency Medicine

## 2018-08-11 ENCOUNTER — Emergency Department (HOSPITAL_COMMUNITY)
Admission: EM | Admit: 2018-08-11 | Discharge: 2018-08-11 | Disposition: A | Discharge status: Home / Self Care | Payer: Medicare Other | Attending: Emergency Medicine | Admitting: Emergency Medicine

## 2018-08-11 ENCOUNTER — Other Ambulatory Visit: Payer: Self-pay | Admitting: Family Medicine

## 2018-08-11 ENCOUNTER — Telehealth: Payer: Self-pay

## 2018-08-11 ENCOUNTER — Emergency Department (HOSPITAL_COMMUNITY): Payer: Medicare Other

## 2018-08-11 DIAGNOSIS — E1129 Type 2 diabetes mellitus with other diabetic kidney complication: Secondary | ICD-10-CM | POA: Diagnosis not present

## 2018-08-11 DIAGNOSIS — N186 End stage renal disease: Secondary | ICD-10-CM | POA: Diagnosis not present

## 2018-08-11 DIAGNOSIS — R42 Dizziness and giddiness: Secondary | ICD-10-CM | POA: Diagnosis not present

## 2018-08-11 DIAGNOSIS — R270 Ataxia, unspecified: Secondary | ICD-10-CM | POA: Diagnosis not present

## 2018-08-11 DIAGNOSIS — N2581 Secondary hyperparathyroidism of renal origin: Secondary | ICD-10-CM | POA: Diagnosis not present

## 2018-08-11 DIAGNOSIS — R000 Tachycardia, unspecified: Secondary | ICD-10-CM | POA: Diagnosis not present

## 2018-08-11 LAB — BASIC METABOLIC PANEL
Anion gap: 16 — ABNORMAL HIGH (ref 5–15)
BUN: 21 mg/dL — ABNORMAL HIGH (ref 6–20)
CO2: 27 mmol/L (ref 22–32)
Calcium: 8.7 mg/dL — ABNORMAL LOW (ref 8.9–10.3)
Chloride: 93 mmol/L — ABNORMAL LOW (ref 98–111)
Creatinine, Ser: 6.31 mg/dL — ABNORMAL HIGH (ref 0.44–1.00)
GFR calc Af Amer: 8 mL/min — ABNORMAL LOW (ref >60)
GFR calc non Af Amer: 7 mL/min — ABNORMAL LOW (ref >60)
Glucose, Bld: 128 mg/dL — ABNORMAL HIGH (ref 70–99)
Potassium: 4 mmol/L (ref 3.5–5.1)
Sodium: 136 mmol/L (ref 135–145)

## 2018-08-11 LAB — CBC
HCT: 39.4 % (ref 36.0–46.0)
Hemoglobin: 12.7 g/dL (ref 12.0–15.0)
MCH: 31.1 pg (ref 26.0–34.0)
MCHC: 32.2 g/dL (ref 30.0–36.0)
MCV: 96.6 fL (ref 80.0–100.0)
Platelets: 221 10*3/uL (ref 150–400)
RBC: 4.08 MIL/uL (ref 3.87–5.11)
RDW: 14.1 % (ref 11.5–15.5)
WBC: 12 10*3/uL — ABNORMAL HIGH (ref 4.0–10.5)
nRBC: 0 % (ref 0.0–0.2)

## 2018-08-11 MED ORDER — LORAZEPAM 2 MG/ML IJ SOLN
1.0000 mg | Freq: Once | INTRAMUSCULAR | Status: AC
  Administered 2018-08-11: 16:00:00 1 mg via INTRAVENOUS
  Filled 2018-08-11: qty 1

## 2018-08-11 MED ORDER — SODIUM CHLORIDE 0.9% FLUSH: 3.0000 mL | Freq: Once | INTRAVENOUS | Status: DC

## 2018-08-11 MED ORDER — MECLIZINE HCL 25 MG PO TABS: 25.0000 mg | ORAL_TABLET | Freq: Three times a day (TID) | ORAL | 0 refills | Status: DC | PRN

## 2018-08-11 MED ORDER — MECLIZINE HCL 25 MG PO TABS
25.0000 mg | ORAL_TABLET | Freq: Once | ORAL | Status: AC
  Administered 2018-08-11: 18:00:00 25 mg via ORAL
  Filled 2018-08-11: qty 1

## 2018-08-11 NOTE — ED Notes (Signed)
Patient Alert and oriented to baseline. Stable and ambulatory to baseline. Patient verbalized understanding of the discharge instructions.  Patient belongings were taken by the patient.   

## 2018-08-11 NOTE — ED Notes (Signed)
Pt returned from MRI °

## 2018-08-11 NOTE — Discharge Instructions (Signed)
Begin taking meclizine as prescribed today.  Return to the emergency department if you develop worsening dizziness, severe headache, high fever, or other new and concerning symptoms.

## 2018-08-11 NOTE — Telephone Encounter (Signed)
Patient called back after dialysis treatment, reports dizziness has improved but continues to have slurred speech. Dialysis nurse told patient she has a slight left sided droop in face. BP prior to dz tx=164/90, after tx=114/78 per patient. Encouraged patient to go to [facility] ER for assessment/treatment. Patient agrees with plan.

## 2018-08-11 NOTE — ED Notes (Signed)
Patient transported to MRI 

## 2018-08-11 NOTE — ED Provider Notes (Signed)
[facility] EMERGENCY DEPARTMENT Provider Note   CSN: [id number] Arrival date & time: 08/11/18  1303    History   Chief Complaint Chief Complaint  Patient presents with  . Dizziness    HPI [name] [name] is a 63 y.o. female.     Patient is a 63 year old female with past medical history of chronic renal insufficiency with transplant in the past, now on dialysis.  She also has a history of diabetes, anxiety, hypertension, and hyperlipidemia.  She presents today for evaluation of dizziness.  This began yesterday and has been steady since.  She describes a spinning sensation that makes her feel off balance.  She denies any fevers or chills.  She denies any chest pain or difficulty breathing.  Patient was at dialysis today, then sent here as they were concerned she had a facial droop.  The history is provided by the patient.  Dizziness  Quality:  Head spinning and imbalance Severity:  Moderate Onset quality:  Sudden Duration:  24 hours Timing:  Constant Progression:  Unchanged Chronicity:  New Relieved by:  Nothing Worsened by:  Nothing Ineffective treatments:  None tried   Past Medical History:  Diagnosis Date  . Adenomatous colon polyp 04/1998  . Anemia   . Anxiety   . Arthritis   . Carotid artery disease ([place])    Carotid US 1/18: bilateral ICA 1-39, R thyroid lobe nodule (1.9x2.2x3cm); numerous L thyroid lobe nodules - repeat 1 year  . Chronic kidney disease    M/W/[facility]  . Chronic renal failure    post transplant  . Depression   . Diabetes mellitus   . EBV infection    Per [facility]  . Encephalitis    NMDA per [facility]  . Esophagitis    Grade 1 Distal  . Focal seizure (HCC)    .  Last one 06/2016  . GERD (gastroesophageal reflux disease)   . Hemorrhoids   . History of kidney stones    Passed  . History of subdural hematoma   . Hx of cardiovascular stress test    Lexiscan Myoview (06/2013):  No ischemia, EF 66%; normal.  //  Myoview 12/17:  EF 62, no ischemia or scar; Normal  . Hx of echocardiogram    a. Echocardiogram (06/2013):  Mod focal basal hypertrophy, EF 60-65%, normal wall motion, Gr 1 DD, mild AI, mildly dilated ascending aorta (41 mm), mild LAE.; b.  Echo 9/16: mod LVH, EF 60-65%, no RWMA, Gr 1 DD, trivial AI, mild dilated ascending aorta, mild LAE  . Hyperkalemia   . Hyperlipidemia   . Hypertension   . Hypomagnesemia   . Left jugular vein thrombosis    Partial resolved per [facility]  . Metabolic acidosis   . Pneumonia   . Right jugular vein thrombosis    resolved from [facility]  . Seizures [facility])     Patient Active Problem List   Diagnosis Date Noted  . Stress incontinence 12/24/2017  . Cough variant asthma 12/24/2017  . Tremor of left hand 12/24/2017  . History of internal jugular thrombosis 03/26/2017  . 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 Chronic diastolic CHF (congestive heart failure) ([place]) 07/08/2016  . Anemia 07/08/2016  . Abnormal brain MRI 07/03/2016  . Fasciculation 06/18/2016  . Carotid artery disease ([place])   . Physical exam 01/28/2016  . Hyperlipidemia 06/28/2015  . Diabetes mellitus type II, controlled ([place]) 06/28/2015  . Thoracic ascending aortic aneurysm (41 mm on Echo 06/2013) 07/06/2013  .  ESRD on dialysis ([place]) 06/16/2013  . History of renal transplant 06/16/2013  . GERD (gastroesophageal reflux disease) 08/13/2009  . OTH&UNSPEC NONINFECTIOUS GASTROENTERITIS&COLITIS 08/13/2009  . Essential hypertension 04/25/2009  . Hemorrhoids 04/25/2009  . WEIGHT LOSS 04/25/2009  . NAUSEA AND VOMITING 04/25/2009  . PERSONAL HX COLONIC POLYPS 04/25/2009    Past Surgical History:  Procedure Laterality Date  . A/V FISTULAGRAM Left 06/04/2017   Procedure: A/V FISTULAGRAM;  Surgeon: Conrad Old Harbor, MD;  Location: [facility];  Service: Cardiovascular;  Laterality: Left;  . ABDOMINAL HYSTERECTOMY    . AV FISTULA PLACEMENT  07/04/2005   [place] AV fistula  . AV  FISTULA PLACEMENT  08/27/2005  . AV FISTULA PLACEMENT Left 04/29/2017   Procedure: Creation of Left Arm ARTERIOVENOUS BRACHIOCEPHALIC FISTULA;  Surgeon: Elam Dutch, MD;  Location: [facility];  Service: Vascular;  Laterality: Left;  . AV FISTULA PLACEMENT Left 06/16/2017   Procedure: BRACHIO_BASCILIC ARTERIOVENOUS (AV) FISTULA CREATION;  Surgeon: Waynetta Sandy, MD;  Location: [facility];  Service: Vascular;  Laterality: Left;  . AV FISTULA PLACEMENT W/ PTFE  08/27/2005  . BASCILIC VEIN TRANSPOSITION Left 06/16/2017   Procedure: BRACHIOCEPHALIC TRANSPOSITION  LEFT ARM;  Surgeon: Waynetta Sandy, MD;  Location: [facility];  Service: Vascular;  Laterality: Left;  . BASCILIC VEIN TRANSPOSITION Left 09/01/2017   Procedure: BASILIC VEIN TRANSPOSITION SECOND STAGE;  Surgeon: Waynetta Sandy, MD;  Location: [facility];  Service: Vascular;  Laterality: Left;  . BRAIN BIOPSY    . CESAREAN SECTION    . DG AV DIALYSIS GRAFT DECLOT OR  07/24/2005   AV Gore-Tex graf  . DG AV DIALYSIS GRAFT DECLOT OR  Thrombosis right forearm, loop arteriovenous   Thrombosis right forearm, loop arteriovenous graft  . GASTROSTOMY TUBE PLACEMENT    . KIDNEY TRANSPLANT  2009   Both  . REMOVAL OF GASTROSTOMY TUBE    . THROMBECTOMY / ARTERIOVENOUS GRAFT REVISION  10/12/2006  . THROMBECTOMY / ARTERIOVENOUS GRAFT REVISION  10/16/2006     OB History   No obstetric history on file.      Home Medications    Prior to Admission medications   Medication Sig Start Date End Date Taking? Authorizing Provider  acetaminophen (TYLENOL) 650 MG CR tablet Take 650-1,300 mg by mouth every 8 (eight) hours as needed for pain.     [provider]  albuterol (VENTOLIN HFA) 108 (90 Base) MCG/ACT inhaler Inhale 2 puffs into the lungs every 4 (four) hours as needed for wheezing or shortness of breath. 03/29/18   Midge Minium, MD  amLODipine (NORVASC) 10 MG tablet Take 1 tablet (10 mg total) by mouth daily. 06/10/18    Midge Minium, MD  atorvastatin (LIPITOR) 40 MG tablet TAKE 1 TABLET BY MOUTH EVERY DAY 02/01/18   Midge Minium, MD  cetirizine (ZYRTEC) 10 MG tablet TAKE 1 TABLET BY MOUTH EVERY DAY 05/25/18   Midge Minium, MD  clonazePAM (KLONOPIN) 0.5 MG tablet TAKE 1 TABLET BY MOUTH AT BEDTIME 04/05/18   Midge Minium, MD  ferric citrate (AURYXIA) 1 GM 210 MG(Fe) tablet Take 210 mg by mouth See admin instructions. Take 210 mg by mouth three times a day with meals and 210 mg two times a day with snacks    [provider]  [place] 250 MCG/BLIST AEPB Please specify directions, refills and quantity 05/07/18   Midge Minium, MD  gabapentin (NEURONTIN) 100 MG capsule TAKE 1 CAPSULE (100 MG TOTAL) BY  MOUTH AT BEDTIME. 08/11/18   Midge Minium, MD  isosorbide dinitrate (ISORDIL) 20 MG tablet TAKE 1 TABLET BY MOUTH THREE TIMES A DAY 02/01/18   Midge Minium, MD  lidocaine-prilocaine (EMLA) cream Apply 1 application topically as directed. Apply small amount to access site (AVF) 1 to 2 hrs before Dialysis. Cover with occlusive dressing. 10/07/17   [provider]  loratadine (CLARITIN) 10 MG tablet Take 1 tablet (10 mg total) by mouth daily. 03/09/18   Midge Minium, MD  meloxicam (MOBIC) 15 MG tablet TAKE 1 TABLET BY MOUTH EVERY DAY 03/02/18   Midge Minium, MD  methylPREDNISolone (MEDROL DOSEPAK) 4 MG TBPK tablet Use as directed 06/24/18   Leandrew Koyanagi, MD  multivitamin (RENA-VIT) TABS tablet Take 1 tablet by mouth every morning.  03/20/17   [provider]  omeprazole (PRILOSEC) 40 MG capsule TAKE 1 CAPSULE BY MOUTH EVERY DAY 05/05/18   Midge Minium, MD  QUEtiapine (SEROQUEL) 100 MG tablet TAKE 1 TABLET BY MOUTH EVERYDAY AT BEDTIME 03/25/18   Midge Minium, MD  traZODone (DESYREL) 50 MG tablet TAKE 0.5-1 TABLETS (25-50 MG TOTAL) BY MOUTH AT BEDTIME AS NEEDED FOR SLEEP. 05/05/18   Midge Minium, MD    Family History  Family History  Problem Relation Age of Onset  . Kidney disease Paternal Aunt   . Heart disease Mother   . Heart disease Father   . Colon cancer Neg Hx     Social History Social History   Tobacco Use  . Smoking status: Never Smoker  . Smokeless tobacco: Never Used  Substance Use Topics  . Alcohol use: No  . Drug use: No     Allergies   Lisinopril and Daypro [oxaprozin]   Review of Systems Review of Systems  Neurological: Positive for dizziness.  All other systems reviewed and are negative.    Physical Exam Updated Vital Signs BP 117/85 (BP Location: Right Arm)   Pulse 76   Temp 99.5 F (37.5 C) (Oral)   Resp (!) 21   Ht 5\' 6"  (1.676 m)   Wt 78.9 kg   SpO2 97%   BMI 28.08 kg/m   Physical Exam Vitals signs and nursing note reviewed.  Constitutional:      General: She is not in acute distress.    Appearance: She is well-developed. She is not diaphoretic.  HENT:     Head: Normocephalic and atraumatic.     Mouth/Throat:     Mouth: Mucous membranes are moist.     Pharynx: No posterior oropharyngeal erythema.  Eyes:     Extraocular Movements: Extraocular movements intact.     Pupils: Pupils are equal, round, and reactive to light.  Neck:     Musculoskeletal: Normal range of motion and neck supple.  Cardiovascular:     Rate and Rhythm: Normal rate and regular rhythm.     Heart sounds: No murmur. No friction rub. No gallop.   Pulmonary:     Effort: Pulmonary effort is normal. No respiratory distress.     Breath sounds: Normal breath sounds. No wheezing.  Abdominal:     General: Bowel sounds are normal. There is no distension.     Palpations: Abdomen is soft.     Tenderness: There is no abdominal tenderness.  Musculoskeletal: Normal range of motion.  Skin:    General: Skin is warm and dry.  Neurological:     General: No focal deficit present.     Mental Status:  She is alert and oriented to person, place, and time.     Cranial Nerves: No cranial nerve  deficit.     Sensory: No sensory deficit.     Motor: No weakness.     Coordination: Coordination normal.      ED Treatments / Results  Labs (all labs ordered are listed, but only abnormal results are displayed) Labs Reviewed  BASIC METABOLIC PANEL - Abnormal; Notable for the following components:      Result Value   Chloride 93 (*)    Glucose, Bld 128 (*)    BUN 21 (*)    Creatinine, Ser 6.31 (*)    Calcium 8.7 (*)    GFR calc non Af Amer 7 (*)    GFR calc Af Amer 8 (*)    Anion gap 16 (*)    All other components within normal limits  CBC - Abnormal; Notable for the following components:   WBC 12.0 (*)    All other components within normal limits  URINALYSIS, ROUTINE W REFLEX MICROSCOPIC  CBG MONITORING, ED    EKG EKG Interpretation  Date/Time:  Wednesday Aug 11 2018 13:11:21 EDT Ventricular Rate:  103 PR Interval:  154 QRS Duration: 98 QT Interval:  368 QTC Calculation: 482 R Axis:   -39 Text Interpretation:  Sinus tachycardia Left axis deviation Abnormal ECG Confirmed by Veryl Speak [phone]) on 08/11/2018 4:10:31 PM   Radiology No results found.  Procedures Procedures (including critical care time)  Medications Ordered in ED Medications  sodium chloride flush (NS) 0.9 % injection 3 mL (has no administration in time range)  meclizine (ANTIVERT) tablet 25 mg (has no administration in time range)     Initial Impression / Assessment and Plan / ED Course  I have reviewed the triage vital signs and the nursing notes.  Pertinent labs & imaging results that were available during my care of the patient were reviewed by me and considered in my medical decision making (see chart for details).  Patient presenting here with complaints of dizziness/spinning sensation since yesterday.  She describes this as constant but worsens with change in position and movement.  She was told she had a facial droop today at dialysis and was sent here for evaluation.  Patient's  work-up reveals no evidence for stroke.  She underwent an MRI and laboratory studies which were unremarkable.  I suspect that this is a peripheral vertigo.  She was given meclizine here in the ER and seems to be feeling better.  She will be prescribed this medication and advised to return if her symptoms worsen or change.  Final Clinical Impressions(s) / ED Diagnoses   Final diagnoses:  None    ED Discharge Orders    None       Veryl Speak, MD 08/11/18 [phone]

## 2018-08-11 NOTE — ED Notes (Signed)
Pt has attempted to provide UA. Cannot at this time. Went to dialysis today.

## 2018-08-11 NOTE — ED Triage Notes (Signed)
Patient reports dizziness onset yesterday morning upon waking that persisted until today after her dialysis appointment. Patient states she also noticed L sided neck pain - constant cramping. Denies shortness of breath, N/V, vision or speech changes. Currently denies dizziness.

## 2018-08-11 NOTE — ED Notes (Signed)
EDP at the bedside.  ?

## 2018-08-11 NOTE — Telephone Encounter (Signed)
Patient called reports slurred speech and dizziness x 2 days.  Patient currently receiving dialysis treatment at [facility]. She reports her blood sugar this morning at the center is 189, she states typically she's in the 400s according to her home meter. Attempted to speak to patients dialysis nurse, call disconnected.   Called [facility], spoke with patients nurse Hollie Salk). Daniella states patient c/o dizziness, did not mention slurred speech. Requested Daniella to re-assess patient and send to ER if symptoms are present, voiced understanding.

## 2018-08-13 ENCOUNTER — Ambulatory Visit (INDEPENDENT_AMBULATORY_CARE_PROVIDER_SITE_OTHER): Payer: Medicare Other | Admitting: Family Medicine

## 2018-08-13 ENCOUNTER — Other Ambulatory Visit: Payer: Self-pay

## 2018-08-13 ENCOUNTER — Encounter: Payer: Self-pay | Admitting: Family Medicine

## 2018-08-13 VITALS — Ht 66.0 in | Wt 174.0 lb

## 2018-08-13 DIAGNOSIS — R42 Dizziness and giddiness: Secondary | ICD-10-CM

## 2018-08-13 DIAGNOSIS — N186 End stage renal disease: Secondary | ICD-10-CM | POA: Diagnosis not present

## 2018-08-13 DIAGNOSIS — N2581 Secondary hyperparathyroidism of renal origin: Secondary | ICD-10-CM | POA: Diagnosis not present

## 2018-08-13 DIAGNOSIS — E1129 Type 2 diabetes mellitus with other diabetic kidney complication: Secondary | ICD-10-CM | POA: Diagnosis not present

## 2018-08-13 MED ORDER — MECLIZINE HCL 25 MG PO TABS: 25.0000 mg | ORAL_TABLET | Freq: Three times a day (TID) | ORAL | 0 refills | Status: DC | PRN

## 2018-08-13 NOTE — Progress Notes (Signed)
Virtual Visit via Video   I connected with patient on 08/13/18 at  3:20 PM EDT by a video enabled telemedicine application and verified that I am speaking with the correct person using two identifiers.  Location patient: Home Location provider: [facility], Office Persons participating in the virtual visit: Patient, Provider, Conde Romelle Starcher D)  I discussed the limitations of evaluation and management by telemedicine and the availability of in person appointments. The patient expressed understanding and agreed to proceed.  Interactive audio and video telecommunications were attempted between this provider and patient, however failed, due to patient having technical difficulties OR patient did not have access to video capability.  We continued and completed visit with audio only.   Subjective:   HPI:   Dizziness- pt reports dizziness since Monday.  She went to ER 2 days ago for possible facial droop and slurring of speech.  Work up was unrevealing and EDP felt she had peripheral vertigo.  Meclizine was prescribed.  'i'm just spinning'.  Pt reports Meclizine will provide temporary relief but then states 'it's not doing anything'.  Pt reports she had sharp pain in L ear today.  Pt doesn't recall hx of vertigo but she has taken Meclizine before.  Pt reports she feels a 'pressure' in both ears.    ROS:   See pertinent positives and negatives per HPI.  Patient Active Problem List   Diagnosis Date Noted  . Stress incontinence 12/24/2017  . Cough variant asthma 12/24/2017  . Tremor of left hand 12/24/2017  . History of internal jugular thrombosis 03/26/2017  . 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 Chronic diastolic CHF (congestive heart failure) ([place]) 07/08/2016  . Anemia 07/08/2016  . Abnormal brain MRI 07/03/2016  . Fasciculation 06/18/2016  . Carotid artery disease ([place])   . Physical exam 01/28/2016  . Hyperlipidemia 06/28/2015  .  Diabetes mellitus type II, controlled ([place]) 06/28/2015  . Thoracic ascending aortic aneurysm (41 mm on Echo 06/2013) 07/06/2013  . ESRD on dialysis ([place]) 06/16/2013  . History of renal transplant 06/16/2013  . GERD (gastroesophageal reflux disease) 08/13/2009  . OTH&UNSPEC NONINFECTIOUS GASTROENTERITIS&COLITIS 08/13/2009  . Essential hypertension 04/25/2009  . Hemorrhoids 04/25/2009  . WEIGHT LOSS 04/25/2009  . NAUSEA AND VOMITING 04/25/2009  . PERSONAL HX COLONIC POLYPS 04/25/2009    Social History   Tobacco Use  . Smoking status: Never Smoker  . Smokeless tobacco: Never Used  Substance Use Topics  . Alcohol use: No    Current Outpatient Medications:  .  acetaminophen (TYLENOL) 650 MG CR tablet, Take 650-1,300 mg by mouth every 8 (eight) hours as needed for pain. , Disp: , Rfl:  .  albuterol (VENTOLIN HFA) 108 (90 Base) MCG/ACT inhaler, Inhale 2 puffs into the lungs every 4 (four) hours as needed for wheezing or shortness of breath., Disp: 1 Inhaler, Rfl: 3 .  amLODipine (NORVASC) 10 MG tablet, Take 1 tablet (10 mg total) by mouth daily., Disp: 30 tablet, Rfl: 3 .  atorvastatin (LIPITOR) 40 MG tablet, TAKE 1 TABLET BY MOUTH EVERY DAY, Disp: 90 tablet, Rfl: 1 .  cetirizine (ZYRTEC) 10 MG tablet, TAKE 1 TABLET BY MOUTH EVERY DAY, Disp: 30 tablet, Rfl: 11 .  clonazePAM (KLONOPIN) 0.5 MG tablet, TAKE 1 TABLET BY MOUTH AT BEDTIME, Disp: 30 tablet, Rfl: 3 .  ferric citrate (AURYXIA) 1 GM 210 MG(Fe) tablet, Take 210 mg by mouth See admin instructions. Take 210 mg by mouth three times a day with meals and 210 mg two  times a day with snacks, Disp: , Rfl:  .  [place] 250 MCG/BLIST AEPB, Please specify directions, refills and quantity, Disp: 60 each, Rfl: 3 .  gabapentin (NEURONTIN) 100 MG capsule, TAKE 1 CAPSULE (100 MG TOTAL) BY MOUTH AT BEDTIME., Disp: 90 capsule, Rfl: 1 .  isosorbide dinitrate (ISORDIL) 20 MG tablet, TAKE 1 TABLET BY MOUTH THREE TIMES A DAY, Disp: 270 tablet, Rfl: 2 .   lidocaine-prilocaine (EMLA) cream, Apply 1 application topically as directed. Apply small amount to access site (AVF) 1 to 2 hrs before Dialysis. Cover with occlusive dressing., Disp: , Rfl: 3 .  multivitamin (RENA-VIT) TABS tablet, Take 1 tablet by mouth every morning. , Disp: , Rfl: 11 .  nitroGLYCERIN (NITROSTAT) 0.4 MG SL tablet, , Disp: , Rfl:  .  omeprazole (PRILOSEC) 40 MG capsule, TAKE 1 CAPSULE BY MOUTH EVERY DAY, Disp: 90 capsule, Rfl: 1 .  QUEtiapine (SEROQUEL) 100 MG tablet, TAKE 1 TABLET BY MOUTH EVERYDAY AT BEDTIME, Disp: 90 tablet, Rfl: 1 .  traZODone (DESYREL) 50 MG tablet, TAKE 0.5-1 TABLETS (25-50 MG TOTAL) BY MOUTH AT BEDTIME AS NEEDED FOR SLEEP., Disp: 90 tablet, Rfl: 1 .  loratadine (CLARITIN) 10 MG tablet, Take 1 tablet (10 mg total) by mouth daily., Disp: 30 tablet, Rfl: 11 .  meclizine (ANTIVERT) 25 MG tablet, Take 1 tablet (25 mg total) by mouth 3 (three) times daily as needed for dizziness., Disp: 15 tablet, Rfl: 0 .  meloxicam (MOBIC) 15 MG tablet, TAKE 1 TABLET BY MOUTH EVERY DAY (Patient not taking: Reported on 08/13/2018), Disp: 30 tablet, Rfl: 1 .  methylPREDNISolone (MEDROL DOSEPAK) 4 MG TBPK tablet, Use as directed, Disp: 21 tablet, Rfl: 0  Current Facility-Administered Medications:  .  methylPREDNISolone acetate (DEPO-MEDROL) injection 40 mg, 40 mg, Intra-articular, Once, Hilts, Michael, MD  Allergies  Allergen Reactions  . Lisinopril Shortness Of Breath, Swelling and Other (See Comments)    Throat irritation also. Patient takes losartan and tolerates fine.  [name] [Oxaprozin] Hives and Dermatitis    Objective:   Ht 5\' 6"  (1.676 m)   Wt 174 lb (78.9 kg)   BMI 28.08 kg/m   Pt is able to speak clearly, coherently without shortness of breath or increased work of breathing.  Thought process is linear.  Mood is appropriate.   Assessment and Plan:   Vertigo- pt has hx of this.  She reports that the meclizine was not working but did note that during our  conversation she was able to walk to the bedroom from the kitchen w/o dizziness and that was a definite improvement for her.  Encouraged her to take the Meclizine regularly to stay ahead of her sxs, increase her water intake, and change positions slowly.  If her ears begin to hurt more regularly, she is to schedule an appt in office to be assessed.  Reviewed supportive care and red flags that should prompt return.  Pt expressed understanding and is in agreement w/ plan.    Annye Asa, MD 08/13/2018  Time spent w/ pt- 16 minutes

## 2018-08-13 NOTE — Progress Notes (Signed)
I have discussed the procedure for the virtual visit with the patient who has given consent to proceed with assessment and treatment.   [name] [name], CMA     

## 2018-08-15 ENCOUNTER — Other Ambulatory Visit: Payer: Self-pay | Admitting: Family Medicine

## 2018-08-16 DIAGNOSIS — N186 End stage renal disease: Secondary | ICD-10-CM | POA: Diagnosis not present

## 2018-08-16 DIAGNOSIS — E1129 Type 2 diabetes mellitus with other diabetic kidney complication: Secondary | ICD-10-CM | POA: Diagnosis not present

## 2018-08-16 DIAGNOSIS — N2581 Secondary hyperparathyroidism of renal origin: Secondary | ICD-10-CM | POA: Diagnosis not present

## 2018-08-18 ENCOUNTER — Telehealth: Payer: Self-pay | Admitting: Family Medicine

## 2018-08-18 DIAGNOSIS — E1129 Type 2 diabetes mellitus with other diabetic kidney complication: Secondary | ICD-10-CM | POA: Diagnosis not present

## 2018-08-18 DIAGNOSIS — N2581 Secondary hyperparathyroidism of renal origin: Secondary | ICD-10-CM | POA: Diagnosis not present

## 2018-08-18 DIAGNOSIS — H538 Other visual disturbances: Secondary | ICD-10-CM

## 2018-08-18 DIAGNOSIS — N186 End stage renal disease: Secondary | ICD-10-CM | POA: Diagnosis not present

## 2018-08-18 NOTE — Telephone Encounter (Signed)
Pt LMOVM asking for referral to an eye doctor due to having blurred vision.

## 2018-08-18 NOTE — Telephone Encounter (Signed)
Ok for referral?

## 2018-08-18 NOTE — Telephone Encounter (Signed)
Called and LMOVM to inform pt that referral was placed this morning. However, if this is a current onset I need her to check her sugars ad return call to office to inform us of the readings.

## 2018-08-18 NOTE — Telephone Encounter (Signed)
[facility] for referral but if blurry vision is sudden, please have her check her sugar

## 2018-08-19 NOTE — Telephone Encounter (Signed)
Called pt again to find out how she is feeling. LMOVM to return call. Encounter closed until pt returns call.

## 2018-08-20 DIAGNOSIS — E1129 Type 2 diabetes mellitus with other diabetic kidney complication: Secondary | ICD-10-CM | POA: Diagnosis not present

## 2018-08-20 DIAGNOSIS — N2581 Secondary hyperparathyroidism of renal origin: Secondary | ICD-10-CM | POA: Diagnosis not present

## 2018-08-20 DIAGNOSIS — N186 End stage renal disease: Secondary | ICD-10-CM | POA: Diagnosis not present

## 2018-08-21 ENCOUNTER — Encounter (HOSPITAL_COMMUNITY): Payer: Self-pay

## 2018-08-21 ENCOUNTER — Ambulatory Visit (HOSPITAL_COMMUNITY)
Admission: EM | Admit: 2018-08-21 | Discharge: 2018-08-21 | Disposition: A | Discharge status: Home / Self Care | Payer: Medicare Other | Attending: Family Medicine | Admitting: Family Medicine

## 2018-08-21 ENCOUNTER — Other Ambulatory Visit: Payer: Self-pay

## 2018-08-21 DIAGNOSIS — R42 Dizziness and giddiness: Secondary | ICD-10-CM

## 2018-08-21 MED ORDER — MECLIZINE HCL 25 MG PO TABS: 25.0000 mg | ORAL_TABLET | Freq: Three times a day (TID) | ORAL | 0 refills | Status: DC | PRN

## 2018-08-21 NOTE — Discharge Instructions (Signed)
Please continue the meclizine and make an appointment with Dr. Constance Holster or his associate on Monday

## 2018-08-21 NOTE — ED Provider Notes (Signed)
[place] [facility]    CSN: [id number] Arrival date & time: 08/21/18  1756     History   Chief Complaint Chief Complaint  Patient presents with  . Dizziness  . Ear Fullness    HPI [name] [name] is a 63 y.o. female.   Pt has had vertigo for past 3 weeks, was seen in ed 2 weeks ago and given meclizine with no improvement, also states she has ear pressure   She is 63 yo with multiple problems including PAD, chronic renal failure post transplant, diabetes, hypertension, h/o subdural, seizure disorder, and hypertension.  Patient is on chronic clonazepam.  Patient states that she has the vertigo feeling at all times, but it worsens with head movement.     Past Medical History:  Diagnosis Date  . Adenomatous colon polyp 04/1998  . Anemia   . Anxiety   . Arthritis   . Carotid artery disease ([place])    Carotid US 1/18: bilateral ICA 1-39, R thyroid lobe nodule (1.9x2.2x3cm); numerous L thyroid lobe nodules - repeat 1 year  . Chronic kidney disease    M/W/[facility]  . Chronic renal failure    post transplant  . Depression   . Diabetes mellitus   . EBV infection    Per [facility]  . Encephalitis    NMDA per [facility]  . Esophagitis    Grade 1 Distal  . Focal seizure (HCC)    .  Last one 06/2016  . GERD (gastroesophageal reflux disease)   . Hemorrhoids   . History of kidney stones    Passed  . History of subdural hematoma   . Hx of cardiovascular stress test    Lexiscan Myoview (06/2013):  No ischemia, EF 66%; normal.  //  Myoview 12/17: EF 62, no ischemia or scar; Normal  . Hx of echocardiogram    a. Echocardiogram (06/2013):  Mod focal basal hypertrophy, EF 60-65%, normal wall motion, Gr 1 DD, mild AI, mildly dilated ascending aorta (41 mm), mild LAE.; b.  Echo 9/16: mod LVH, EF 60-65%, no RWMA, Gr 1 DD, trivial AI, mild dilated ascending aorta, mild LAE  . Hyperkalemia   . Hyperlipidemia   . Hypertension   . Hypomagnesemia   . Left jugular vein thrombosis    Partial  resolved per [facility]  . Metabolic acidosis   . Pneumonia   . Right jugular vein thrombosis    resolved from [facility]  . Seizures [facility])     Patient Active Problem List   Diagnosis Date Noted  . Stress incontinence 12/24/2017  . Cough variant asthma 12/24/2017  . Tremor of left hand 12/24/2017  . History of internal jugular thrombosis 03/26/2017  . 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 Chronic diastolic CHF (congestive heart failure) ([place]) 07/08/2016  . Anemia 07/08/2016  . Abnormal brain MRI 07/03/2016  . Fasciculation 06/18/2016  . Carotid artery disease ([place])   . Physical exam 01/28/2016  . Hyperlipidemia 06/28/2015  . Diabetes mellitus type II, controlled ([place]) 06/28/2015  . Thoracic ascending aortic aneurysm (41 mm on Echo 06/2013) 07/06/2013  . ESRD on dialysis ([place]) 06/16/2013  . History of renal transplant 06/16/2013  . GERD (gastroesophageal reflux disease) 08/13/2009  . OTH&UNSPEC NONINFECTIOUS GASTROENTERITIS&COLITIS 08/13/2009  . Essential hypertension 04/25/2009  . Hemorrhoids 04/25/2009  . WEIGHT LOSS 04/25/2009  . NAUSEA AND VOMITING 04/25/2009  . PERSONAL HX COLONIC POLYPS 04/25/2009    Past Surgical History:  Procedure Laterality Date  . A/V FISTULAGRAM Left 06/04/2017  Procedure: A/V FISTULAGRAM;  Surgeon: Conrad [place], MD;  Location: [facility];  Service: Cardiovascular;  Laterality: Left;  . ABDOMINAL HYSTERECTOMY    . AV FISTULA PLACEMENT  07/04/2005   [place] AV fistula  . AV FISTULA PLACEMENT  08/27/2005  . AV FISTULA PLACEMENT Left 04/29/2017   Procedure: Creation of Left Arm ARTERIOVENOUS BRACHIOCEPHALIC FISTULA;  Surgeon: Elam Dutch, MD;  Location: [facility];  Service: Vascular;  Laterality: Left;  . AV FISTULA PLACEMENT Left 06/16/2017   Procedure: BRACHIO_BASCILIC ARTERIOVENOUS (AV) FISTULA CREATION;  Surgeon: Waynetta Sandy, MD;  Location: [facility];  Service: Vascular;  Laterality: Left;  . AV FISTULA  PLACEMENT W/ PTFE  08/27/2005  . BASCILIC VEIN TRANSPOSITION Left 06/16/2017   Procedure: BRACHIOCEPHALIC TRANSPOSITION  LEFT ARM;  Surgeon: Waynetta Sandy, MD;  Location: [facility];  Service: Vascular;  Laterality: Left;  . BASCILIC VEIN TRANSPOSITION Left 09/01/2017   Procedure: BASILIC VEIN TRANSPOSITION SECOND STAGE;  Surgeon: Waynetta Sandy, MD;  Location: [facility];  Service: Vascular;  Laterality: Left;  . BRAIN BIOPSY    . CESAREAN SECTION    . DG AV DIALYSIS GRAFT DECLOT OR  07/24/2005   AV Gore-Tex graf  . DG AV DIALYSIS GRAFT DECLOT OR  Thrombosis right forearm, loop arteriovenous   Thrombosis right forearm, loop arteriovenous graft  . GASTROSTOMY TUBE PLACEMENT    . KIDNEY TRANSPLANT  2009   Both  . REMOVAL OF GASTROSTOMY TUBE    . THROMBECTOMY / ARTERIOVENOUS GRAFT REVISION  10/12/2006  . THROMBECTOMY / ARTERIOVENOUS GRAFT REVISION  10/16/2006    OB History   No obstetric history on file.      Home Medications    Prior to Admission medications   Medication Sig Start Date End Date Taking? Authorizing Provider  acetaminophen (TYLENOL) 650 MG CR tablet Take 650-1,300 mg by mouth every 8 (eight) hours as needed for pain.     [provider]  albuterol (VENTOLIN HFA) 108 (90 Base) MCG/ACT inhaler Inhale 2 puffs into the lungs every 4 (four) hours as needed for wheezing or shortness of breath. 03/29/18   Midge Minium, MD  amLODipine (NORVASC) 10 MG tablet TAKE 1 TABLET BY MOUTH EVERY DAY 08/17/18   Midge Minium, MD  atorvastatin (LIPITOR) 40 MG tablet TAKE 1 TABLET BY MOUTH EVERY DAY 02/01/18   Midge Minium, MD  cetirizine (ZYRTEC) 10 MG tablet TAKE 1 TABLET BY MOUTH EVERY DAY 05/25/18   Midge Minium, MD  clonazePAM (KLONOPIN) 0.5 MG tablet TAKE 1 TABLET BY MOUTH AT BEDTIME 04/05/18   Midge Minium, MD  ferric citrate (AURYXIA) 1 GM 210 MG(Fe) tablet Take 210 mg by mouth See admin instructions. Take 210 mg by mouth three times  a day with meals and 210 mg two times a day with snacks    [provider]  FLOVENT DISKUS 250 MCG/BLIST AEPB Please specify directions, refills and quantity 05/07/18   Midge Minium, MD  gabapentin (NEURONTIN) 100 MG capsule TAKE 1 CAPSULE (100 MG TOTAL) BY MOUTH AT BEDTIME. 08/11/18   Midge Minium, MD  isosorbide dinitrate (ISORDIL) 20 MG tablet TAKE 1 TABLET BY MOUTH THREE TIMES A DAY 02/01/18   Midge Minium, MD  lidocaine-prilocaine (EMLA) cream Apply 1 application topically as directed. Apply small amount to access site (AVF) 1 to 2 hrs before Dialysis. Cover with occlusive dressing. 10/07/17   [provider]  loratadine (CLARITIN) 10 MG tablet Take  1 tablet (10 mg total) by mouth daily. 03/09/18   Midge Minium, MD  meclizine (ANTIVERT) 25 MG tablet Take 1 tablet (25 mg total) by mouth 3 (three) times daily as needed for dizziness. 08/21/18   Robyn Haber, MD  methylPREDNISolone (MEDROL DOSEPAK) 4 MG TBPK tablet Use as directed 06/24/18   Leandrew Koyanagi, MD  multivitamin (RENA-VIT) TABS tablet Take 1 tablet by mouth every morning.  03/20/17   [provider]  nitroGLYCERIN (NITROSTAT) 0.4 MG SL tablet  06/16/13   [provider]  omeprazole (PRILOSEC) 40 MG capsule TAKE 1 CAPSULE BY MOUTH EVERY DAY 05/05/18   Midge Minium, MD  QUEtiapine (SEROQUEL) 100 MG tablet TAKE 1 TABLET BY MOUTH EVERYDAY AT BEDTIME 03/25/18   Midge Minium, MD  traZODone (DESYREL) 50 MG tablet TAKE 0.5-1 TABLETS (25-50 MG TOTAL) BY MOUTH AT BEDTIME AS NEEDED FOR SLEEP. 05/05/18   Midge Minium, MD    Family History Family History  Problem Relation Age of Onset  . Kidney disease Paternal Aunt   . Heart disease Mother   . Heart disease Father   . Colon cancer Neg Hx     Social History Social History   Tobacco Use  . Smoking status: Never Smoker  . Smokeless tobacco: Never Used  Substance Use Topics  . Alcohol use: No  . Drug use: No      Allergies   Lisinopril and Daypro [oxaprozin]   Review of Systems Review of Systems  Neurological: Positive for dizziness and light-headedness.  All other systems reviewed and are negative.    Physical Exam Triage Vital Signs ED Triage Vitals [08/21/18 1803]  Enc Vitals Group     BP      Pulse      Resp      Temp      Temp src      SpO2      Weight      Height      Head Circumference      Peak Flow      Pain Score 1     Pain Loc      Pain Edu?      Excl. in [place]?    No data found.  Updated Vital Signs BP 110/73   Pulse 98   Temp 98.3 F (36.8 C)   Resp 18   SpO2 99%    Physical Exam Vitals signs and nursing note reviewed.  Constitutional:      General: She is not in acute distress.    Appearance: Normal appearance. She is obese. She is not ill-appearing or toxic-appearing.  HENT:     Head: Normocephalic.     Right Ear: Tympanic membrane normal. There is no impacted cerumen.     Left Ear: Tympanic membrane normal. There is no impacted cerumen.     Nose: Nose normal.     Mouth/Throat:     Mouth: Mucous membranes are moist.  Eyes:     Extraocular Movements: Extraocular movements intact.     Conjunctiva/sclera: Conjunctivae normal.     Pupils: Pupils are equal, round, and reactive to light.  Neck:     Musculoskeletal: Normal range of motion and neck supple.     Vascular: No carotid bruit.  Cardiovascular:     Rate and Rhythm: Normal rate and regular rhythm.     Pulses: Normal pulses.     Comments: Thrill left shunt side on biceps Pulmonary:     Effort:  Pulmonary effort is normal.     Breath sounds: Normal breath sounds.  Musculoskeletal: Normal range of motion.  Skin:    General: Skin is warm and dry.  Neurological:     General: No focal deficit present.     Mental Status: She is alert.  Psychiatric:        Mood and Affect: Mood normal.        Behavior: Behavior normal.        Thought Content: Thought content normal.      UC Treatments /  Results  Labs (all labs ordered are listed, but only abnormal results are displayed) Labs Reviewed - No data to display  EKG None  Radiology No results found.  Procedures Procedures (including critical care time)  Medications Ordered in UC Medications - No data to display  Initial Impression / Assessment and Plan / UC Course  I have reviewed the triage vital signs and the nursing notes.  Pertinent labs & imaging results that were available during my care of the patient were reviewed by me and considered in my medical decision making (see chart for details).    Final Clinical Impressions(s) / UC Diagnoses   Final diagnoses:  Dizziness  Vertigo     Discharge Instructions     Please continue the meclizine and make an appointment with Dr. Constance Holster or his associate on Monday    ED Prescriptions    Medication Sig Dispense Auth. Provider   meclizine (ANTIVERT) 25 MG tablet Take 1 tablet (25 mg total) by mouth 3 (three) times daily as needed for dizziness. 45 tablet Robyn Haber, MD     Controlled Substance Prescriptions [place] Controlled Substance Registry consulted? Not Applicable   Robyn Haber, MD 08/21/18 [phone]

## 2018-08-21 NOTE — ED Triage Notes (Signed)
Pt has had vertigo for past 3 weeks, was seen in ed 2 weeks ago and given meclizine with no improvement, also states she has ear pressure

## 2018-08-23 DIAGNOSIS — D509 Iron deficiency anemia, unspecified: Secondary | ICD-10-CM | POA: Diagnosis not present

## 2018-08-23 DIAGNOSIS — Z992 Dependence on renal dialysis: Secondary | ICD-10-CM | POA: Diagnosis not present

## 2018-08-23 DIAGNOSIS — D631 Anemia in chronic kidney disease: Secondary | ICD-10-CM | POA: Diagnosis not present

## 2018-08-23 DIAGNOSIS — I150 Renovascular hypertension: Secondary | ICD-10-CM | POA: Diagnosis not present

## 2018-08-23 DIAGNOSIS — N2581 Secondary hyperparathyroidism of renal origin: Secondary | ICD-10-CM | POA: Diagnosis not present

## 2018-08-23 DIAGNOSIS — N186 End stage renal disease: Secondary | ICD-10-CM | POA: Diagnosis not present

## 2018-08-23 DIAGNOSIS — E1129 Type 2 diabetes mellitus with other diabetic kidney complication: Secondary | ICD-10-CM | POA: Diagnosis not present

## 2018-08-24 DIAGNOSIS — H3554 Dystrophies primarily involving the retinal pigment epithelium: Secondary | ICD-10-CM | POA: Diagnosis not present

## 2018-08-24 DIAGNOSIS — H2513 Age-related nuclear cataract, bilateral: Secondary | ICD-10-CM | POA: Diagnosis not present

## 2018-08-24 DIAGNOSIS — H524 Presbyopia: Secondary | ICD-10-CM | POA: Diagnosis not present

## 2018-08-24 DIAGNOSIS — H35013 Changes in retinal vascular appearance, bilateral: Secondary | ICD-10-CM | POA: Diagnosis not present

## 2018-08-24 DIAGNOSIS — H35033 Hypertensive retinopathy, bilateral: Secondary | ICD-10-CM | POA: Diagnosis not present

## 2018-08-24 LAB — HM DIABETES EYE EXAM

## 2018-08-25 DIAGNOSIS — N186 End stage renal disease: Secondary | ICD-10-CM | POA: Diagnosis not present

## 2018-08-25 DIAGNOSIS — D509 Iron deficiency anemia, unspecified: Secondary | ICD-10-CM | POA: Diagnosis not present

## 2018-08-25 DIAGNOSIS — E1129 Type 2 diabetes mellitus with other diabetic kidney complication: Secondary | ICD-10-CM | POA: Diagnosis not present

## 2018-08-25 DIAGNOSIS — N2581 Secondary hyperparathyroidism of renal origin: Secondary | ICD-10-CM | POA: Diagnosis not present

## 2018-08-25 DIAGNOSIS — D631 Anemia in chronic kidney disease: Secondary | ICD-10-CM | POA: Diagnosis not present

## 2018-08-26 ENCOUNTER — Encounter: Payer: Self-pay | Admitting: General Practice

## 2018-08-27 DIAGNOSIS — N2581 Secondary hyperparathyroidism of renal origin: Secondary | ICD-10-CM | POA: Diagnosis not present

## 2018-08-27 DIAGNOSIS — N186 End stage renal disease: Secondary | ICD-10-CM | POA: Diagnosis not present

## 2018-08-27 DIAGNOSIS — E1129 Type 2 diabetes mellitus with other diabetic kidney complication: Secondary | ICD-10-CM | POA: Diagnosis not present

## 2018-08-27 DIAGNOSIS — D631 Anemia in chronic kidney disease: Secondary | ICD-10-CM | POA: Diagnosis not present

## 2018-08-27 DIAGNOSIS — D509 Iron deficiency anemia, unspecified: Secondary | ICD-10-CM | POA: Diagnosis not present

## 2018-08-30 DIAGNOSIS — E1129 Type 2 diabetes mellitus with other diabetic kidney complication: Secondary | ICD-10-CM | POA: Diagnosis not present

## 2018-08-30 DIAGNOSIS — D509 Iron deficiency anemia, unspecified: Secondary | ICD-10-CM | POA: Diagnosis not present

## 2018-08-30 DIAGNOSIS — D631 Anemia in chronic kidney disease: Secondary | ICD-10-CM | POA: Diagnosis not present

## 2018-08-30 DIAGNOSIS — N186 End stage renal disease: Secondary | ICD-10-CM | POA: Diagnosis not present

## 2018-08-30 DIAGNOSIS — N2581 Secondary hyperparathyroidism of renal origin: Secondary | ICD-10-CM | POA: Diagnosis not present

## 2018-09-01 ENCOUNTER — Other Ambulatory Visit: Payer: Self-pay | Admitting: Family Medicine

## 2018-09-01 DIAGNOSIS — D631 Anemia in chronic kidney disease: Secondary | ICD-10-CM | POA: Diagnosis not present

## 2018-09-01 DIAGNOSIS — N186 End stage renal disease: Secondary | ICD-10-CM | POA: Diagnosis not present

## 2018-09-01 DIAGNOSIS — D509 Iron deficiency anemia, unspecified: Secondary | ICD-10-CM | POA: Diagnosis not present

## 2018-09-01 DIAGNOSIS — E1129 Type 2 diabetes mellitus with other diabetic kidney complication: Secondary | ICD-10-CM | POA: Diagnosis not present

## 2018-09-01 DIAGNOSIS — N2581 Secondary hyperparathyroidism of renal origin: Secondary | ICD-10-CM | POA: Diagnosis not present

## 2018-09-03 DIAGNOSIS — N2581 Secondary hyperparathyroidism of renal origin: Secondary | ICD-10-CM | POA: Diagnosis not present

## 2018-09-03 DIAGNOSIS — D509 Iron deficiency anemia, unspecified: Secondary | ICD-10-CM | POA: Diagnosis not present

## 2018-09-03 DIAGNOSIS — N186 End stage renal disease: Secondary | ICD-10-CM | POA: Diagnosis not present

## 2018-09-03 DIAGNOSIS — E1129 Type 2 diabetes mellitus with other diabetic kidney complication: Secondary | ICD-10-CM | POA: Diagnosis not present

## 2018-09-03 DIAGNOSIS — D631 Anemia in chronic kidney disease: Secondary | ICD-10-CM | POA: Diagnosis not present

## 2018-09-06 DIAGNOSIS — E1129 Type 2 diabetes mellitus with other diabetic kidney complication: Secondary | ICD-10-CM | POA: Diagnosis not present

## 2018-09-06 DIAGNOSIS — D509 Iron deficiency anemia, unspecified: Secondary | ICD-10-CM | POA: Diagnosis not present

## 2018-09-06 DIAGNOSIS — D631 Anemia in chronic kidney disease: Secondary | ICD-10-CM | POA: Diagnosis not present

## 2018-09-06 DIAGNOSIS — N2581 Secondary hyperparathyroidism of renal origin: Secondary | ICD-10-CM | POA: Diagnosis not present

## 2018-09-06 DIAGNOSIS — N186 End stage renal disease: Secondary | ICD-10-CM | POA: Diagnosis not present

## 2018-09-08 DIAGNOSIS — E1129 Type 2 diabetes mellitus with other diabetic kidney complication: Secondary | ICD-10-CM | POA: Diagnosis not present

## 2018-09-08 DIAGNOSIS — N186 End stage renal disease: Secondary | ICD-10-CM | POA: Diagnosis not present

## 2018-09-08 DIAGNOSIS — D509 Iron deficiency anemia, unspecified: Secondary | ICD-10-CM | POA: Diagnosis not present

## 2018-09-08 DIAGNOSIS — N2581 Secondary hyperparathyroidism of renal origin: Secondary | ICD-10-CM | POA: Diagnosis not present

## 2018-09-08 DIAGNOSIS — D631 Anemia in chronic kidney disease: Secondary | ICD-10-CM | POA: Diagnosis not present

## 2018-09-10 DIAGNOSIS — E1129 Type 2 diabetes mellitus with other diabetic kidney complication: Secondary | ICD-10-CM | POA: Diagnosis not present

## 2018-09-10 DIAGNOSIS — D509 Iron deficiency anemia, unspecified: Secondary | ICD-10-CM | POA: Diagnosis not present

## 2018-09-10 DIAGNOSIS — N2581 Secondary hyperparathyroidism of renal origin: Secondary | ICD-10-CM | POA: Diagnosis not present

## 2018-09-10 DIAGNOSIS — N186 End stage renal disease: Secondary | ICD-10-CM | POA: Diagnosis not present

## 2018-09-10 DIAGNOSIS — D631 Anemia in chronic kidney disease: Secondary | ICD-10-CM | POA: Diagnosis not present

## 2018-09-13 DIAGNOSIS — N2581 Secondary hyperparathyroidism of renal origin: Secondary | ICD-10-CM | POA: Diagnosis not present

## 2018-09-13 DIAGNOSIS — N186 End stage renal disease: Secondary | ICD-10-CM | POA: Diagnosis not present

## 2018-09-13 DIAGNOSIS — E1129 Type 2 diabetes mellitus with other diabetic kidney complication: Secondary | ICD-10-CM | POA: Diagnosis not present

## 2018-09-13 DIAGNOSIS — D509 Iron deficiency anemia, unspecified: Secondary | ICD-10-CM | POA: Diagnosis not present

## 2018-09-13 DIAGNOSIS — D631 Anemia in chronic kidney disease: Secondary | ICD-10-CM | POA: Diagnosis not present

## 2018-09-15 DIAGNOSIS — N186 End stage renal disease: Secondary | ICD-10-CM | POA: Diagnosis not present

## 2018-09-15 DIAGNOSIS — N2581 Secondary hyperparathyroidism of renal origin: Secondary | ICD-10-CM | POA: Diagnosis not present

## 2018-09-15 DIAGNOSIS — D631 Anemia in chronic kidney disease: Secondary | ICD-10-CM | POA: Diagnosis not present

## 2018-09-15 DIAGNOSIS — E1129 Type 2 diabetes mellitus with other diabetic kidney complication: Secondary | ICD-10-CM | POA: Diagnosis not present

## 2018-09-15 DIAGNOSIS — D509 Iron deficiency anemia, unspecified: Secondary | ICD-10-CM | POA: Diagnosis not present

## 2018-09-16 ENCOUNTER — Telehealth: Payer: Self-pay | Admitting: Family Medicine

## 2018-09-16 NOTE — Telephone Encounter (Signed)
Patient notified of PCP recommendations and is agreement and expresses an understanding.   Ok for PEC to Discuss results / PCP recommendations / Schedule patient.   

## 2018-09-16 NOTE — Telephone Encounter (Signed)
Please advise on this medication

## 2018-09-16 NOTE — Telephone Encounter (Signed)
Pt was instructed on 6/4 to call her Neurologist.  This is the next step since the Meclizine isn't helping and she's still dizzy

## 2018-09-16 NOTE — Telephone Encounter (Signed)
[name] [name] told her to call her [facility] Neurologist

## 2018-09-16 NOTE — Telephone Encounter (Signed)
Tried calling pt, no answer.

## 2018-09-16 NOTE — Telephone Encounter (Signed)
Pt called stating she is still experiencing dizziness. Pt states she has been taking meclizine (ANTIVERT) 25 MG tablet, but is still very dizzy. Please advise.

## 2018-09-16 NOTE — Telephone Encounter (Signed)
Spoke with pt she said she has seen [facility] in the past she thinks. She does not want to continue seeing them. Ok for a referral to someone new?

## 2018-09-17 DIAGNOSIS — D631 Anemia in chronic kidney disease: Secondary | ICD-10-CM | POA: Diagnosis not present

## 2018-09-17 DIAGNOSIS — D509 Iron deficiency anemia, unspecified: Secondary | ICD-10-CM | POA: Diagnosis not present

## 2018-09-17 DIAGNOSIS — E1129 Type 2 diabetes mellitus with other diabetic kidney complication: Secondary | ICD-10-CM | POA: Diagnosis not present

## 2018-09-17 DIAGNOSIS — N2581 Secondary hyperparathyroidism of renal origin: Secondary | ICD-10-CM | POA: Diagnosis not present

## 2018-09-17 DIAGNOSIS — N186 End stage renal disease: Secondary | ICD-10-CM | POA: Diagnosis not present

## 2018-09-20 DIAGNOSIS — N2581 Secondary hyperparathyroidism of renal origin: Secondary | ICD-10-CM | POA: Diagnosis not present

## 2018-09-20 DIAGNOSIS — D631 Anemia in chronic kidney disease: Secondary | ICD-10-CM | POA: Diagnosis not present

## 2018-09-20 DIAGNOSIS — D509 Iron deficiency anemia, unspecified: Secondary | ICD-10-CM | POA: Diagnosis not present

## 2018-09-20 DIAGNOSIS — E1129 Type 2 diabetes mellitus with other diabetic kidney complication: Secondary | ICD-10-CM | POA: Diagnosis not present

## 2018-09-20 DIAGNOSIS — N186 End stage renal disease: Secondary | ICD-10-CM | POA: Diagnosis not present

## 2018-09-21 ENCOUNTER — Telehealth: Payer: Self-pay | Admitting: Family Medicine

## 2018-09-21 ENCOUNTER — Encounter: Payer: Self-pay | Admitting: Family Medicine

## 2018-09-21 MED ORDER — MECLIZINE HCL 25 MG PO TABS: 25.0000 mg | ORAL_TABLET | Freq: Three times a day (TID) | ORAL | 0 refills | Status: DC | PRN

## 2018-09-21 NOTE — Telephone Encounter (Signed)
Medication Refill - Medication: meclizine (ANTIVERT) 25 MG tablet    Has the patient contacted their pharmacy? Yes.   (Agent: If no, request that the patient contact the pharmacy for the refill.) (Agent: If yes, when and what did the pharmacy advise?)  Preferred Pharmacy (with phone number or street name):  [id number] - [place], [address]  [place] [address]  Phone: [phone] Fax: [phone]     Agent: Please be advised that RX refills may take up to 3 business days. We ask that you follow-up with your pharmacy.

## 2018-09-21 NOTE — Telephone Encounter (Signed)
Medication filled to pharmacy as requested.   

## 2018-09-22 DIAGNOSIS — N2581 Secondary hyperparathyroidism of renal origin: Secondary | ICD-10-CM | POA: Diagnosis not present

## 2018-09-22 DIAGNOSIS — D509 Iron deficiency anemia, unspecified: Secondary | ICD-10-CM | POA: Diagnosis not present

## 2018-09-22 DIAGNOSIS — N186 End stage renal disease: Secondary | ICD-10-CM | POA: Diagnosis not present

## 2018-09-22 DIAGNOSIS — Z992 Dependence on renal dialysis: Secondary | ICD-10-CM | POA: Diagnosis not present

## 2018-09-22 DIAGNOSIS — I150 Renovascular hypertension: Secondary | ICD-10-CM | POA: Diagnosis not present

## 2018-09-22 DIAGNOSIS — E1129 Type 2 diabetes mellitus with other diabetic kidney complication: Secondary | ICD-10-CM | POA: Diagnosis not present

## 2018-09-24 ENCOUNTER — Other Ambulatory Visit: Payer: Self-pay | Admitting: Family Medicine

## 2018-09-24 DIAGNOSIS — N186 End stage renal disease: Secondary | ICD-10-CM | POA: Diagnosis not present

## 2018-09-24 DIAGNOSIS — N2581 Secondary hyperparathyroidism of renal origin: Secondary | ICD-10-CM | POA: Diagnosis not present

## 2018-09-24 DIAGNOSIS — D509 Iron deficiency anemia, unspecified: Secondary | ICD-10-CM | POA: Diagnosis not present

## 2018-09-24 DIAGNOSIS — E1129 Type 2 diabetes mellitus with other diabetic kidney complication: Secondary | ICD-10-CM | POA: Diagnosis not present

## 2018-09-27 DIAGNOSIS — D509 Iron deficiency anemia, unspecified: Secondary | ICD-10-CM | POA: Diagnosis not present

## 2018-09-27 DIAGNOSIS — N186 End stage renal disease: Secondary | ICD-10-CM | POA: Diagnosis not present

## 2018-09-27 DIAGNOSIS — E1129 Type 2 diabetes mellitus with other diabetic kidney complication: Secondary | ICD-10-CM | POA: Diagnosis not present

## 2018-09-27 DIAGNOSIS — N2581 Secondary hyperparathyroidism of renal origin: Secondary | ICD-10-CM | POA: Diagnosis not present

## 2018-09-27 NOTE — Telephone Encounter (Signed)
Last refill:04/05/18 #30, 3 Last OV:08/13/18

## 2018-09-29 DIAGNOSIS — E1129 Type 2 diabetes mellitus with other diabetic kidney complication: Secondary | ICD-10-CM | POA: Diagnosis not present

## 2018-09-29 DIAGNOSIS — D509 Iron deficiency anemia, unspecified: Secondary | ICD-10-CM | POA: Diagnosis not present

## 2018-09-29 DIAGNOSIS — N186 End stage renal disease: Secondary | ICD-10-CM | POA: Diagnosis not present

## 2018-09-29 DIAGNOSIS — N2581 Secondary hyperparathyroidism of renal origin: Secondary | ICD-10-CM | POA: Diagnosis not present

## 2018-10-01 ENCOUNTER — Telehealth: Payer: Self-pay

## 2018-10-01 DIAGNOSIS — M79671 Pain in right foot: Secondary | ICD-10-CM

## 2018-10-01 DIAGNOSIS — D509 Iron deficiency anemia, unspecified: Secondary | ICD-10-CM | POA: Diagnosis not present

## 2018-10-01 DIAGNOSIS — N2581 Secondary hyperparathyroidism of renal origin: Secondary | ICD-10-CM | POA: Diagnosis not present

## 2018-10-01 DIAGNOSIS — E1129 Type 2 diabetes mellitus with other diabetic kidney complication: Secondary | ICD-10-CM | POA: Diagnosis not present

## 2018-10-01 DIAGNOSIS — N186 End stage renal disease: Secondary | ICD-10-CM | POA: Diagnosis not present

## 2018-10-01 NOTE — Telephone Encounter (Signed)
Echo for podiatry referral for foot pain

## 2018-10-01 NOTE — Telephone Encounter (Signed)
Copied from [place] [phone]. Topic: Referral - Request for Referral >> Oct 01, 2018  1:25 PM Wynetta Emery, [place] C wrote: Pt called in to request a referral to a podiatrist. Pt says that she has seen provider for concern before but cant remember how she was advised.  Pt says that she has pain and irritation in both of her feet.   Please assist.

## 2018-10-01 NOTE — Telephone Encounter (Signed)
Referral placed.

## 2018-10-01 NOTE — Addendum Note (Signed)
Addended by: Davis Gourd on: 10/01/2018 04:19 PM   Modules accepted: Orders

## 2018-10-01 NOTE — Telephone Encounter (Signed)
Please advise. Pt was seen in 12/2017 for numbness in feet and sent to neuro. I do not see any office visit in regards to pain

## 2018-10-04 DIAGNOSIS — N2581 Secondary hyperparathyroidism of renal origin: Secondary | ICD-10-CM | POA: Diagnosis not present

## 2018-10-04 DIAGNOSIS — N186 End stage renal disease: Secondary | ICD-10-CM | POA: Diagnosis not present

## 2018-10-06 DIAGNOSIS — N2581 Secondary hyperparathyroidism of renal origin: Secondary | ICD-10-CM | POA: Diagnosis not present

## 2018-10-06 DIAGNOSIS — N186 End stage renal disease: Secondary | ICD-10-CM | POA: Diagnosis not present

## 2018-10-08 DIAGNOSIS — N186 End stage renal disease: Secondary | ICD-10-CM | POA: Diagnosis not present

## 2018-10-08 DIAGNOSIS — N2581 Secondary hyperparathyroidism of renal origin: Secondary | ICD-10-CM | POA: Diagnosis not present

## 2018-10-09 ENCOUNTER — Other Ambulatory Visit: Payer: Self-pay | Admitting: Family Medicine

## 2018-10-11 DIAGNOSIS — D509 Iron deficiency anemia, unspecified: Secondary | ICD-10-CM | POA: Diagnosis not present

## 2018-10-11 DIAGNOSIS — N2581 Secondary hyperparathyroidism of renal origin: Secondary | ICD-10-CM | POA: Diagnosis not present

## 2018-10-11 DIAGNOSIS — N186 End stage renal disease: Secondary | ICD-10-CM | POA: Diagnosis not present

## 2018-10-11 DIAGNOSIS — E1129 Type 2 diabetes mellitus with other diabetic kidney complication: Secondary | ICD-10-CM | POA: Diagnosis not present

## 2018-10-12 DIAGNOSIS — N186 End stage renal disease: Secondary | ICD-10-CM | POA: Diagnosis not present

## 2018-10-12 DIAGNOSIS — E1129 Type 2 diabetes mellitus with other diabetic kidney complication: Secondary | ICD-10-CM | POA: Diagnosis not present

## 2018-10-12 DIAGNOSIS — N2581 Secondary hyperparathyroidism of renal origin: Secondary | ICD-10-CM | POA: Diagnosis not present

## 2018-10-12 DIAGNOSIS — D509 Iron deficiency anemia, unspecified: Secondary | ICD-10-CM | POA: Diagnosis not present

## 2018-10-13 DIAGNOSIS — N186 End stage renal disease: Secondary | ICD-10-CM | POA: Diagnosis not present

## 2018-10-13 DIAGNOSIS — E1129 Type 2 diabetes mellitus with other diabetic kidney complication: Secondary | ICD-10-CM | POA: Diagnosis not present

## 2018-10-13 DIAGNOSIS — D509 Iron deficiency anemia, unspecified: Secondary | ICD-10-CM | POA: Diagnosis not present

## 2018-10-13 DIAGNOSIS — N2581 Secondary hyperparathyroidism of renal origin: Secondary | ICD-10-CM | POA: Diagnosis not present

## 2018-10-15 DIAGNOSIS — N2581 Secondary hyperparathyroidism of renal origin: Secondary | ICD-10-CM | POA: Diagnosis not present

## 2018-10-15 DIAGNOSIS — E1129 Type 2 diabetes mellitus with other diabetic kidney complication: Secondary | ICD-10-CM | POA: Diagnosis not present

## 2018-10-15 DIAGNOSIS — D509 Iron deficiency anemia, unspecified: Secondary | ICD-10-CM | POA: Diagnosis not present

## 2018-10-15 DIAGNOSIS — N186 End stage renal disease: Secondary | ICD-10-CM | POA: Diagnosis not present

## 2018-10-18 DIAGNOSIS — D509 Iron deficiency anemia, unspecified: Secondary | ICD-10-CM | POA: Diagnosis not present

## 2018-10-18 DIAGNOSIS — E1129 Type 2 diabetes mellitus with other diabetic kidney complication: Secondary | ICD-10-CM | POA: Diagnosis not present

## 2018-10-18 DIAGNOSIS — N186 End stage renal disease: Secondary | ICD-10-CM | POA: Diagnosis not present

## 2018-10-18 DIAGNOSIS — N2581 Secondary hyperparathyroidism of renal origin: Secondary | ICD-10-CM | POA: Diagnosis not present

## 2018-10-20 DIAGNOSIS — D509 Iron deficiency anemia, unspecified: Secondary | ICD-10-CM | POA: Diagnosis not present

## 2018-10-20 DIAGNOSIS — N186 End stage renal disease: Secondary | ICD-10-CM | POA: Diagnosis not present

## 2018-10-20 DIAGNOSIS — N2581 Secondary hyperparathyroidism of renal origin: Secondary | ICD-10-CM | POA: Diagnosis not present

## 2018-10-20 DIAGNOSIS — E1129 Type 2 diabetes mellitus with other diabetic kidney complication: Secondary | ICD-10-CM | POA: Diagnosis not present

## 2018-10-22 DIAGNOSIS — D509 Iron deficiency anemia, unspecified: Secondary | ICD-10-CM | POA: Diagnosis not present

## 2018-10-22 DIAGNOSIS — N186 End stage renal disease: Secondary | ICD-10-CM | POA: Diagnosis not present

## 2018-10-22 DIAGNOSIS — N2581 Secondary hyperparathyroidism of renal origin: Secondary | ICD-10-CM | POA: Diagnosis not present

## 2018-10-22 DIAGNOSIS — E1129 Type 2 diabetes mellitus with other diabetic kidney complication: Secondary | ICD-10-CM | POA: Diagnosis not present

## 2018-10-23 DIAGNOSIS — N186 End stage renal disease: Secondary | ICD-10-CM | POA: Diagnosis not present

## 2018-10-23 DIAGNOSIS — I150 Renovascular hypertension: Secondary | ICD-10-CM | POA: Diagnosis not present

## 2018-10-23 DIAGNOSIS — Z992 Dependence on renal dialysis: Secondary | ICD-10-CM | POA: Diagnosis not present

## 2018-10-25 DIAGNOSIS — E1129 Type 2 diabetes mellitus with other diabetic kidney complication: Secondary | ICD-10-CM | POA: Diagnosis not present

## 2018-10-25 DIAGNOSIS — Z992 Dependence on renal dialysis: Secondary | ICD-10-CM | POA: Diagnosis not present

## 2018-10-25 DIAGNOSIS — N186 End stage renal disease: Secondary | ICD-10-CM | POA: Diagnosis not present

## 2018-10-25 DIAGNOSIS — N2581 Secondary hyperparathyroidism of renal origin: Secondary | ICD-10-CM | POA: Diagnosis not present

## 2018-10-27 DIAGNOSIS — E1129 Type 2 diabetes mellitus with other diabetic kidney complication: Secondary | ICD-10-CM | POA: Diagnosis not present

## 2018-10-27 DIAGNOSIS — N2581 Secondary hyperparathyroidism of renal origin: Secondary | ICD-10-CM | POA: Diagnosis not present

## 2018-10-27 DIAGNOSIS — N186 End stage renal disease: Secondary | ICD-10-CM | POA: Diagnosis not present

## 2018-10-27 DIAGNOSIS — Z992 Dependence on renal dialysis: Secondary | ICD-10-CM | POA: Diagnosis not present

## 2018-10-29 DIAGNOSIS — N186 End stage renal disease: Secondary | ICD-10-CM | POA: Diagnosis not present

## 2018-10-29 DIAGNOSIS — E1129 Type 2 diabetes mellitus with other diabetic kidney complication: Secondary | ICD-10-CM | POA: Diagnosis not present

## 2018-10-29 DIAGNOSIS — Z992 Dependence on renal dialysis: Secondary | ICD-10-CM | POA: Diagnosis not present

## 2018-10-29 DIAGNOSIS — N2581 Secondary hyperparathyroidism of renal origin: Secondary | ICD-10-CM | POA: Diagnosis not present

## 2018-11-01 DIAGNOSIS — Z992 Dependence on renal dialysis: Secondary | ICD-10-CM | POA: Diagnosis not present

## 2018-11-01 DIAGNOSIS — E1129 Type 2 diabetes mellitus with other diabetic kidney complication: Secondary | ICD-10-CM | POA: Diagnosis not present

## 2018-11-01 DIAGNOSIS — N186 End stage renal disease: Secondary | ICD-10-CM | POA: Diagnosis not present

## 2018-11-01 DIAGNOSIS — N2581 Secondary hyperparathyroidism of renal origin: Secondary | ICD-10-CM | POA: Diagnosis not present

## 2018-11-03 ENCOUNTER — Telehealth (HOSPITAL_COMMUNITY): Payer: Self-pay | Admitting: Rehabilitation

## 2018-11-03 ENCOUNTER — Other Ambulatory Visit: Payer: Self-pay | Admitting: Family Medicine

## 2018-11-03 DIAGNOSIS — E1129 Type 2 diabetes mellitus with other diabetic kidney complication: Secondary | ICD-10-CM | POA: Diagnosis not present

## 2018-11-03 DIAGNOSIS — Z992 Dependence on renal dialysis: Secondary | ICD-10-CM | POA: Diagnosis not present

## 2018-11-03 DIAGNOSIS — N2581 Secondary hyperparathyroidism of renal origin: Secondary | ICD-10-CM | POA: Diagnosis not present

## 2018-11-03 DIAGNOSIS — N186 End stage renal disease: Secondary | ICD-10-CM | POA: Diagnosis not present

## 2018-11-03 NOTE — Telephone Encounter (Signed)

## 2018-11-04 ENCOUNTER — Ambulatory Visit: Payer: Self-pay

## 2018-11-04 ENCOUNTER — Ambulatory Visit: Payer: Medicare Other | Admitting: Family

## 2018-11-04 NOTE — Telephone Encounter (Signed)
Error

## 2018-11-04 NOTE — Telephone Encounter (Signed)
Please advise 

## 2018-11-04 NOTE — Telephone Encounter (Signed)
Called pt to find out if symptoms are still persistent. There was no answer on the phone. Please advise I you would like for pt to monitor symptoms or make a virtual visit for evaluation.

## 2018-11-04 NOTE — Telephone Encounter (Signed)
She has diabetes and a breakfast of danish and blueberry muffin is A LOT of sugar.  She may have been dehydrated from dialysis yesterday and her walk this AM.  She needs to tell her dialysis center what happened as they may need to adjust her dry weight given the heat and humidity.

## 2018-11-04 NOTE — Telephone Encounter (Signed)
Called and spoke with pt. she has not had another episode today. She was advised to increase her water intake to avoid dehydration and she will inform the dialysis center tomorrow what happened so they can adjust her dry weight.

## 2018-11-04 NOTE — Telephone Encounter (Signed)
Pt called stating that she got out of bed feeling good and went for her walk. When she returned she had breakfast of coffee, danish and blueberry muffin. Right after that she felt sick to her stomach.  She vomited and had diarrhea.  She states that she is feeling  better but is still nauseated. She is a dialysis patient.  She had dialysis yesterday. She has had a feeding tube in in the past.  She is pre diabetic per patient report. Care advice read to patient with precautions to follow her dialysis recommendation for fluid intake. Call place to office. Will route note to Dr Birdie Riddle for further evaluation.  Reason for Disposition . [1] MILD or MODERATE vomiting AND [2] present > 48 hours (2 days) (Exception: mild vomiting with associated diarrhea)  Answer Assessment - Initial Assessment Questions 1. VOMITING SEVERITY: "How many times have you vomited in the past 24 hours?"     - MILD:  1 - 2 times/day    - MODERATE: 3 - 5 times/day, decreased oral intake without significant weight loss or symptoms of dehydration    - SEVERE: 6 or more times/day, vomits everything or nearly everything, with significant weight loss, symptoms of dehydration      One time 2. ONSET: "When did the vomiting begin?"      today 3. FLUIDS: "What fluids or food have you vomited up today?" "Have you been able to keep any fluids down?"     water 4. ABDOMINAL PAIN: "Are your having any abdominal pain?" If yes : "How bad is it and what does it feel like?" (e.g., crampy, dull, intermittent, constant)      none 5. DIARRHEA: "Is there any diarrhea?" If so, ask: "How many times today?"      yes 6. CONTACTS: "Is there anyone else in the family with the same symptoms?"      no 7. CAUSE: "What do you think is causing your vomiting?"    unsure 8. HYDRATION STATUS: "Any signs of dehydration?" (e.g., dry mouth [not only dry lips], too weak to stand) "When did you last urinate?"     No dialysis patient 9. OTHER SYMPTOMS: "Do you have  any other symptoms?" (e.g., fever, headache, vertigo, vomiting blood or coffee grounds, recent head injury)     Now feeling better but still nauseated 10. PREGNANCY: "Is there any chance you are pregnant?" "When was your last menstrual period?"       N/A  Protocols used: [facility]

## 2018-11-05 DIAGNOSIS — E1129 Type 2 diabetes mellitus with other diabetic kidney complication: Secondary | ICD-10-CM | POA: Diagnosis not present

## 2018-11-05 DIAGNOSIS — N2581 Secondary hyperparathyroidism of renal origin: Secondary | ICD-10-CM | POA: Diagnosis not present

## 2018-11-05 DIAGNOSIS — N186 End stage renal disease: Secondary | ICD-10-CM | POA: Diagnosis not present

## 2018-11-05 DIAGNOSIS — Z992 Dependence on renal dialysis: Secondary | ICD-10-CM | POA: Diagnosis not present

## 2018-11-08 DIAGNOSIS — Z992 Dependence on renal dialysis: Secondary | ICD-10-CM | POA: Diagnosis not present

## 2018-11-08 DIAGNOSIS — E1129 Type 2 diabetes mellitus with other diabetic kidney complication: Secondary | ICD-10-CM | POA: Diagnosis not present

## 2018-11-08 DIAGNOSIS — N2581 Secondary hyperparathyroidism of renal origin: Secondary | ICD-10-CM | POA: Diagnosis not present

## 2018-11-08 DIAGNOSIS — N186 End stage renal disease: Secondary | ICD-10-CM | POA: Diagnosis not present

## 2018-11-10 DIAGNOSIS — E1129 Type 2 diabetes mellitus with other diabetic kidney complication: Secondary | ICD-10-CM | POA: Diagnosis not present

## 2018-11-10 DIAGNOSIS — N186 End stage renal disease: Secondary | ICD-10-CM | POA: Diagnosis not present

## 2018-11-10 DIAGNOSIS — N2581 Secondary hyperparathyroidism of renal origin: Secondary | ICD-10-CM | POA: Diagnosis not present

## 2018-11-10 DIAGNOSIS — Z992 Dependence on renal dialysis: Secondary | ICD-10-CM | POA: Diagnosis not present

## 2018-11-12 ENCOUNTER — Other Ambulatory Visit: Payer: Self-pay | Admitting: Family Medicine

## 2018-11-12 DIAGNOSIS — N2581 Secondary hyperparathyroidism of renal origin: Secondary | ICD-10-CM | POA: Diagnosis not present

## 2018-11-12 DIAGNOSIS — E1129 Type 2 diabetes mellitus with other diabetic kidney complication: Secondary | ICD-10-CM | POA: Diagnosis not present

## 2018-11-12 DIAGNOSIS — N186 End stage renal disease: Secondary | ICD-10-CM | POA: Diagnosis not present

## 2018-11-12 DIAGNOSIS — Z992 Dependence on renal dialysis: Secondary | ICD-10-CM | POA: Diagnosis not present

## 2018-11-13 ENCOUNTER — Other Ambulatory Visit: Payer: Self-pay | Admitting: Family Medicine

## 2018-11-15 DIAGNOSIS — N186 End stage renal disease: Secondary | ICD-10-CM | POA: Diagnosis not present

## 2018-11-15 DIAGNOSIS — E1129 Type 2 diabetes mellitus with other diabetic kidney complication: Secondary | ICD-10-CM | POA: Diagnosis not present

## 2018-11-15 DIAGNOSIS — N2581 Secondary hyperparathyroidism of renal origin: Secondary | ICD-10-CM | POA: Diagnosis not present

## 2018-11-15 DIAGNOSIS — Z992 Dependence on renal dialysis: Secondary | ICD-10-CM | POA: Diagnosis not present

## 2018-11-17 DIAGNOSIS — N186 End stage renal disease: Secondary | ICD-10-CM | POA: Diagnosis not present

## 2018-11-17 DIAGNOSIS — E1129 Type 2 diabetes mellitus with other diabetic kidney complication: Secondary | ICD-10-CM | POA: Diagnosis not present

## 2018-11-17 DIAGNOSIS — Z992 Dependence on renal dialysis: Secondary | ICD-10-CM | POA: Diagnosis not present

## 2018-11-17 DIAGNOSIS — N2581 Secondary hyperparathyroidism of renal origin: Secondary | ICD-10-CM | POA: Diagnosis not present

## 2018-11-18 ENCOUNTER — Inpatient Hospital Stay (HOSPITAL_COMMUNITY): Admission: RE | Admit: 2018-11-18 | Payer: Medicare Other | Source: Ambulatory Visit

## 2018-11-18 ENCOUNTER — Encounter: Payer: Self-pay | Admitting: Family

## 2018-11-18 ENCOUNTER — Other Ambulatory Visit: Payer: Self-pay | Admitting: *Deleted

## 2018-11-18 ENCOUNTER — Encounter: Payer: Self-pay | Admitting: *Deleted

## 2018-11-18 ENCOUNTER — Ambulatory Visit (INDEPENDENT_AMBULATORY_CARE_PROVIDER_SITE_OTHER): Payer: Medicare Other | Admitting: Family

## 2018-11-18 ENCOUNTER — Other Ambulatory Visit: Payer: Self-pay

## 2018-11-18 VITALS — BP 103/68 | HR 68 | Temp 97.9°F | Resp 14 | Ht 65.0 in | Wt 183.4 lb

## 2018-11-18 DIAGNOSIS — N186 End stage renal disease: Secondary | ICD-10-CM | POA: Diagnosis not present

## 2018-11-18 DIAGNOSIS — Z940 Kidney transplant status: Secondary | ICD-10-CM

## 2018-11-18 DIAGNOSIS — T82590A Other mechanical complication of surgically created arteriovenous fistula, initial encounter: Secondary | ICD-10-CM

## 2018-11-18 DIAGNOSIS — Z992 Dependence on renal dialysis: Secondary | ICD-10-CM | POA: Diagnosis not present

## 2018-11-18 NOTE — Progress Notes (Signed)
CC: non healing ulcerations for several months on aneurysmal sections of AVF  History of Present Illness  [name] [name] is a 63 y.o. (05/08/1955) female who is s/p leftsecond stage basilic vein transpositionby Dr. Dimas [place]: 09/01/17).   She returns today at the request of Dr. Joelyn Oms for non healing area on AVF.  She denies having any interventions for her AVF by [facility].  Pt states that the small ulcerations at both small aneurysms of her left upper arm AVF have been there for months.  She denies any problems in HD other than having generalized cramping in which HD has to be stopped about 10 minutes early.   She states several years ago she had a brain infection, was hospitalized, and has mild left side weakness from this.   She dialyzes M-W-F via left upper arm AVF.  Pt had a double renal transplant about 2009, became sick and had to stop her anti rejection medications, had to resume HD. Daughter states pt's transplanted kidneys are producing some urine.   She has had a previous AV fistula in her right arm.   She is left hand dominant.   Pt c/o tingling and numbness in feet, denies wounds.    Past Medical History:  Diagnosis Date  . Adenomatous colon polyp 04/1998  . Anemia   . Anxiety   . Arthritis   . Carotid artery disease ([place])    Carotid US 1/18: bilateral ICA 1-39, R thyroid lobe nodule (1.9x2.2x3cm); numerous L thyroid lobe nodules - repeat 1 year  . Chronic kidney disease    M/W/F [facility]  . Chronic renal failure    post transplant  . Depression   . Diabetes mellitus   . EBV infection    Per [facility]  . Encephalitis    NMDA per [facility]  . Esophagitis    Grade 1 Distal  . Focal seizure (HCC)    .  Last one 06/2016  . GERD (gastroesophageal reflux disease)   . Hemorrhoids   . History of kidney stones    Passed  . History of subdural hematoma   . Hx of cardiovascular stress test    Lexiscan Myoview (06/2013):  No ischemia, EF 66%; normal.  //   Myoview 12/17: EF 62, no ischemia or scar; Normal  . Hx of echocardiogram    a. Echocardiogram (06/2013):  Mod focal basal hypertrophy, EF 60-65%, normal wall motion, Gr 1 DD, mild AI, mildly dilated ascending aorta (41 mm), mild LAE.; b.  Echo 9/16: mod LVH, EF 60-65%, no RWMA, Gr 1 DD, trivial AI, mild dilated ascending aorta, mild LAE  . Hyperkalemia   . Hyperlipidemia   . Hypertension   . Hypomagnesemia   . Left jugular vein thrombosis    Partial resolved per [facility]  . Metabolic acidosis   . Pneumonia   . Right jugular vein thrombosis    resolved from [facility]  . Seizures ([place])     Social History Social History   Tobacco Use  . Smoking status: Never Smoker  . Smokeless tobacco: Never Used  Substance Use Topics  . Alcohol use: No  . Drug use: No    Family History Family History  Problem Relation Age of Onset  . Kidney disease Paternal Aunt   . Heart disease Mother   . Heart disease Father   . Colon cancer Neg Hx     Surgical History Past Surgical History:  Procedure Laterality Date  . A/V FISTULAGRAM Left 06/04/2017  Procedure: A/V FISTULAGRAM;  Surgeon: Conrad [place], MD;  Location: [facility];  Service: Cardiovascular;  Laterality: Left;  . ABDOMINAL HYSTERECTOMY    . AV FISTULA PLACEMENT  07/04/2005   [place] AV fistula  . AV FISTULA PLACEMENT  08/27/2005  . AV FISTULA PLACEMENT Left 04/29/2017   Procedure: Creation of Left Arm ARTERIOVENOUS BRACHIOCEPHALIC FISTULA;  Surgeon: Elam Dutch, MD;  Location: [facility];  Service: Vascular;  Laterality: Left;  . AV FISTULA PLACEMENT Left 06/16/2017   Procedure: BRACHIO_BASCILIC ARTERIOVENOUS (AV) FISTULA CREATION;  Surgeon: Waynetta Sandy, MD;  Location: [facility];  Service: Vascular;  Laterality: Left;  . AV FISTULA PLACEMENT W/ PTFE  08/27/2005  . BASCILIC VEIN TRANSPOSITION Left 06/16/2017   Procedure: BRACHIOCEPHALIC TRANSPOSITION  LEFT ARM;  Surgeon: Waynetta Sandy, MD;  Location: [facility];  Service:  Vascular;  Laterality: Left;  . BASCILIC VEIN TRANSPOSITION Left 09/01/2017   Procedure: BASILIC VEIN TRANSPOSITION SECOND STAGE;  Surgeon: Waynetta Sandy, MD;  Location: [facility];  Service: Vascular;  Laterality: Left;  . BRAIN BIOPSY    . CESAREAN SECTION    . DG AV DIALYSIS GRAFT DECLOT OR  07/24/2005   AV Gore-Tex graf  . DG AV DIALYSIS GRAFT DECLOT OR  Thrombosis right forearm, loop arteriovenous   Thrombosis right forearm, loop arteriovenous graft  . GASTROSTOMY TUBE PLACEMENT    . KIDNEY TRANSPLANT  2009   Both  . REMOVAL OF GASTROSTOMY TUBE    . THROMBECTOMY / ARTERIOVENOUS GRAFT REVISION  10/12/2006  . THROMBECTOMY / ARTERIOVENOUS GRAFT REVISION  10/16/2006    Allergies  Allergen Reactions  . Lisinopril Shortness Of Breath, Swelling and Other (See Comments)    Throat irritation also. Patient takes losartan and tolerates fine.  [name] [Oxaprozin] Hives and Dermatitis    Current Outpatient Medications  Medication Sig Dispense Refill  . acetaminophen (TYLENOL) 650 MG CR tablet Take 650-1,300 mg by mouth every 8 (eight) hours as needed for pain.     [name] albuterol (VENTOLIN HFA) 108 (90 Base) MCG/ACT inhaler Inhale 2 puffs into the lungs every 4 (four) hours as needed for wheezing or shortness of breath. 1 Inhaler 3  . amLODipine (NORVASC) 10 MG tablet TAKE 1 TABLET BY MOUTH EVERY DAY 90 tablet 1  . atorvastatin (LIPITOR) 40 MG tablet TAKE 1 TABLET BY MOUTH EVERY DAY 90 tablet 1  . cetirizine (ZYRTEC) 10 MG tablet TAKE 1 TABLET BY MOUTH EVERY DAY 30 tablet 11  . clonazePAM (KLONOPIN) 0.5 MG tablet TAKE 1 TABLET BY MOUTH EVERYDAY AT BEDTIME 30 tablet 3  . ferric citrate (AURYXIA) 1 GM 210 MG(Fe) tablet Take 210 mg by mouth See admin instructions. Take 210 mg by mouth three times a day with meals and 210 mg two times a day with snacks    . FLOVENT DISKUS 250 MCG/BLIST AEPB Please specify directions, refills and quantity 60 each 3  . gabapentin (NEURONTIN) 100 MG capsule  TAKE 1 CAPSULE (100 MG TOTAL) BY MOUTH AT BEDTIME. 90 capsule 1  . isosorbide dinitrate (ISORDIL) 20 MG tablet TAKE 1 TABLET BY MOUTH THREE TIMES A DAY 270 tablet 2  . lidocaine-prilocaine (EMLA) cream Apply 1 application topically as directed. Apply small amount to access site (AVF) 1 to 2 hrs before Dialysis. Cover with occlusive dressing.  3  . loratadine (CLARITIN) 10 MG tablet Take 1 tablet (10 mg total) by mouth daily. 30 tablet 11  . meclizine (ANTIVERT) 25 MG tablet TAKE 1  TABLET (25 MG TOTAL) BY MOUTH 3 (THREE) TIMES DAILY AS NEEDED FOR DIZZINESS. 45 tablet 0  . methylPREDNISolone (MEDROL DOSEPAK) 4 MG TBPK tablet Use as directed 21 tablet 0  . multivitamin (RENA-VIT) TABS tablet Take 1 tablet by mouth every morning.   11  . nitroGLYCERIN (NITROSTAT) 0.4 MG SL tablet     . omeprazole (PRILOSEC) 40 MG capsule TAKE 1 CAPSULE BY MOUTH EVERY DAY 90 capsule 1  . QUEtiapine (SEROQUEL) 100 MG tablet TAKE 1 TABLET BY MOUTH EVERYDAY AT BEDTIME 90 tablet 1  . traZODone (DESYREL) 50 MG tablet TAKE 0.5-1 TABLETS (25-50 MG TOTAL) BY MOUTH AT BEDTIME AS NEEDED FOR SLEEP. 90 tablet 1   Current Facility-Administered Medications  Medication Dose Route Frequency Provider Last Rate Last Dose  . methylPREDNISolone acetate (DEPO-MEDROL) injection 40 mg  40 mg Intra-articular Once Hilts, Michael, MD         REVIEW OF SYSTEMS: see HPI for pertinent positives and negatives    PHYSICAL EXAMINATION:  Vitals:   11/18/18 1310  BP: 103/68  Pulse: 68  Resp: 14  Temp: 97.9 F (36.6 C)  TempSrc: Temporal  SpO2: 98%  Weight: 183 lb 6.4 oz (83.2 kg)  Height: 5\' 5"  (1.651 m)   Body mass index is 30.52 kg/m.  General: Obese female in NAD  HEENT:  No gross abnormalities Pulmonary: Respirations are non-labored, fair air movement in all fields, no rales, rhonchi, or wheezing  Abdomen: Soft and non-tender with normal bowel sounds. Musculoskeletal: There are no major deformities.   Neurologic: Left upper  and lower extremity strength at 4/5, right at 5/5.  Skin: There are no rashes noted.   Left upper arm AVF, an ulceration noted on the two more proximal aneurysms.   Psychiatric: The patient has normal affect. Cardiovascular: There is a regular rate and rhythm without significant murmur appreciated.  Palpable thrill and audible bruit at left upper arm AVF Faint bilateral palpable radial pulses.   Medical Decision Making  JEZABEL LECKER is a 63 y.o. female who is s/p leftsecond stage basilic vein transpositionby Dr. Dimas [place]: 09/01/17).    Pt referred by Dr. Joelyn Oms for evaluation of an ulceration on each of two aneurysms at her left upper arm AVF that have been present for months per pt. Pt denies bleeding from these ulcerations.  She is able to tolerate all but the last 10 minutes of HD, has to stop due to generalized cramping.  She states no other problems during HD.  She has some tenderness at the more proximal aneurysm.  I discussed the above with Dr. Oneida Alar. Will schedule plication of the two more proximal aneurysms with small ulcerations on a non HD day.    Clemon Chambers, RN, MSN, FNP-C [facility] Office: [phone]  11/18/2018, 1:35 PM  Clinic MD: Oneida Alar

## 2018-11-19 ENCOUNTER — Other Ambulatory Visit (HOSPITAL_COMMUNITY)
Admission: RE | Admit: 2018-11-19 | Discharge: 2018-11-19 | Disposition: A | Discharge status: Home / Self Care | Payer: Medicare Other | Source: Ambulatory Visit | Attending: Vascular Surgery | Admitting: Vascular Surgery

## 2018-11-19 DIAGNOSIS — Z01812 Encounter for preprocedural laboratory examination: Secondary | ICD-10-CM | POA: Insufficient documentation

## 2018-11-19 DIAGNOSIS — E1129 Type 2 diabetes mellitus with other diabetic kidney complication: Secondary | ICD-10-CM | POA: Diagnosis not present

## 2018-11-19 DIAGNOSIS — N2581 Secondary hyperparathyroidism of renal origin: Secondary | ICD-10-CM | POA: Diagnosis not present

## 2018-11-19 DIAGNOSIS — Z992 Dependence on renal dialysis: Secondary | ICD-10-CM | POA: Diagnosis not present

## 2018-11-19 DIAGNOSIS — N186 End stage renal disease: Secondary | ICD-10-CM | POA: Diagnosis not present

## 2018-11-19 DIAGNOSIS — Z20828 Contact with and (suspected) exposure to other viral communicable diseases: Secondary | ICD-10-CM | POA: Insufficient documentation

## 2018-11-19 LAB — SARS CORONAVIRUS 2 (TAT 6-24 HRS): SARS Coronavirus 2: NEGATIVE

## 2018-11-22 ENCOUNTER — Other Ambulatory Visit: Payer: Self-pay

## 2018-11-22 ENCOUNTER — Encounter (HOSPITAL_COMMUNITY): Payer: Self-pay | Admitting: *Deleted

## 2018-11-22 DIAGNOSIS — E1129 Type 2 diabetes mellitus with other diabetic kidney complication: Secondary | ICD-10-CM | POA: Diagnosis not present

## 2018-11-22 DIAGNOSIS — N2581 Secondary hyperparathyroidism of renal origin: Secondary | ICD-10-CM | POA: Diagnosis not present

## 2018-11-22 DIAGNOSIS — N186 End stage renal disease: Secondary | ICD-10-CM | POA: Diagnosis not present

## 2018-11-22 DIAGNOSIS — Z992 Dependence on renal dialysis: Secondary | ICD-10-CM | POA: Diagnosis not present

## 2018-11-22 NOTE — Progress Notes (Signed)
[name] [name] denies chest pain or shortness of breath. Patient tested negative for Covid, she has been in quarantine except to go to dialysis- she wears a mask. [name] [name] has type II diabetes , diet contr9olled. SHe rarely checks CBG.  I instructed patient to check CBG after awaking and every 2 hours until arrival  to the hospital.  I Instructed patient if CBG is less than 70 to drink 1/2 cup of a clear juice. Recheck CBG in 15 minutes then call pre- op desk at [phone] for further instructions.

## 2018-11-22 NOTE — Anesthesia Preprocedure Evaluation (Addendum)
Anesthesia Evaluation  Patient identified by MRN, date of birth, ID band Patient awake    Reviewed: Allergy & Precautions, NPO status , Patient's Chart, lab work & pertinent test results  History of Anesthesia Complications Negative for: history of anesthetic complications  Airway Mallampati: II  TM Distance: >3 FB Neck ROM: Full    Dental  (+) Missing,    Pulmonary asthma , former smoker,    Pulmonary exam normal        Cardiovascular hypertension, Pt. on medications +CHF  Normal cardiovascular exam  Negative stress echo 2017, EF 62%   Neuro/Psych Seizures - (focal seizures, last in 2018), Well Controlled,  Anxiety Depression    GI/Hepatic Neg liver ROS, GERD  Controlled,  Endo/Other  diabetes, Type 2  Renal/GU ESRF and DialysisRenal disease (HD M/W/F)     Musculoskeletal  (+) Arthritis ,   Abdominal   Peds  Hematology negative hematology ROS (+)   Anesthesia Other Findings Day of surgery medications reviewed with the patient.  Reproductive/Obstetrics                            Anesthesia Physical Anesthesia Plan  ASA: III  Anesthesia Plan: General   Post-op Pain Management:    Induction: Intravenous  PONV Risk Score and Plan: 3 and Treatment may vary due to age or medical condition, Ondansetron and Dexamethasone  Airway Management Planned: LMA  Additional Equipment:   Intra-op Plan:   Post-operative Plan: Extubation in OR  Informed Consent: I have reviewed the patients History and Physical, chart, labs and discussed the procedure including the risks, benefits and alternatives for the proposed anesthesia with the patient or authorized representative who has indicated his/her understanding and acceptance.     Dental advisory given  Plan Discussed with: CRNA  Anesthesia Plan Comments:        Anesthesia Quick Evaluation

## 2018-11-23 ENCOUNTER — Ambulatory Visit (HOSPITAL_COMMUNITY): Payer: Medicare Other | Admitting: Anesthesiology

## 2018-11-23 ENCOUNTER — Encounter (HOSPITAL_COMMUNITY)
Admission: RE | Disposition: A | Discharge status: Home / Self Care | Payer: Self-pay | Source: Home / Self Care | Attending: Vascular Surgery

## 2018-11-23 ENCOUNTER — Ambulatory Visit (HOSPITAL_COMMUNITY)
Admission: RE | Admit: 2018-11-23 | Discharge: 2018-11-23 | Disposition: A | Discharge status: Home / Self Care | Payer: Medicare Other | Attending: Vascular Surgery | Admitting: Vascular Surgery

## 2018-11-23 ENCOUNTER — Telehealth: Payer: Self-pay

## 2018-11-23 ENCOUNTER — Encounter (HOSPITAL_COMMUNITY): Payer: Self-pay

## 2018-11-23 ENCOUNTER — Other Ambulatory Visit: Payer: Self-pay

## 2018-11-23 DIAGNOSIS — I2510 Atherosclerotic heart disease of native coronary artery without angina pectoris: Secondary | ICD-10-CM | POA: Diagnosis not present

## 2018-11-23 DIAGNOSIS — I509 Heart failure, unspecified: Secondary | ICD-10-CM | POA: Diagnosis not present

## 2018-11-23 DIAGNOSIS — D509 Iron deficiency anemia, unspecified: Secondary | ICD-10-CM | POA: Diagnosis not present

## 2018-11-23 DIAGNOSIS — I150 Renovascular hypertension: Secondary | ICD-10-CM | POA: Diagnosis not present

## 2018-11-23 DIAGNOSIS — L98499 Non-pressure chronic ulcer of skin of other sites with unspecified severity: Secondary | ICD-10-CM | POA: Insufficient documentation

## 2018-11-23 DIAGNOSIS — Z87442 Personal history of urinary calculi: Secondary | ICD-10-CM | POA: Diagnosis not present

## 2018-11-23 DIAGNOSIS — T82898A Other specified complication of vascular prosthetic devices, implants and grafts, initial encounter: Secondary | ICD-10-CM | POA: Diagnosis not present

## 2018-11-23 DIAGNOSIS — Z8249 Family history of ischemic heart disease and other diseases of the circulatory system: Secondary | ICD-10-CM | POA: Diagnosis not present

## 2018-11-23 DIAGNOSIS — J45909 Unspecified asthma, uncomplicated: Secondary | ICD-10-CM | POA: Insufficient documentation

## 2018-11-23 DIAGNOSIS — E1122 Type 2 diabetes mellitus with diabetic chronic kidney disease: Secondary | ICD-10-CM | POA: Insufficient documentation

## 2018-11-23 DIAGNOSIS — X58XXXA Exposure to other specified factors, initial encounter: Secondary | ICD-10-CM | POA: Insufficient documentation

## 2018-11-23 DIAGNOSIS — E875 Hyperkalemia: Secondary | ICD-10-CM | POA: Diagnosis not present

## 2018-11-23 DIAGNOSIS — F329 Major depressive disorder, single episode, unspecified: Secondary | ICD-10-CM | POA: Diagnosis not present

## 2018-11-23 DIAGNOSIS — E785 Hyperlipidemia, unspecified: Secondary | ICD-10-CM | POA: Diagnosis not present

## 2018-11-23 DIAGNOSIS — Z992 Dependence on renal dialysis: Secondary | ICD-10-CM | POA: Insufficient documentation

## 2018-11-23 DIAGNOSIS — Z87891 Personal history of nicotine dependence: Secondary | ICD-10-CM | POA: Insufficient documentation

## 2018-11-23 DIAGNOSIS — Z86718 Personal history of other venous thrombosis and embolism: Secondary | ICD-10-CM | POA: Diagnosis not present

## 2018-11-23 DIAGNOSIS — Z841 Family history of disorders of kidney and ureter: Secondary | ICD-10-CM | POA: Diagnosis not present

## 2018-11-23 DIAGNOSIS — E11622 Type 2 diabetes mellitus with other skin ulcer: Secondary | ICD-10-CM | POA: Insufficient documentation

## 2018-11-23 DIAGNOSIS — M1990 Unspecified osteoarthritis, unspecified site: Secondary | ICD-10-CM | POA: Diagnosis not present

## 2018-11-23 DIAGNOSIS — E1129 Type 2 diabetes mellitus with other diabetic kidney complication: Secondary | ICD-10-CM | POA: Diagnosis not present

## 2018-11-23 DIAGNOSIS — N2581 Secondary hyperparathyroidism of renal origin: Secondary | ICD-10-CM | POA: Diagnosis not present

## 2018-11-23 DIAGNOSIS — K219 Gastro-esophageal reflux disease without esophagitis: Secondary | ICD-10-CM | POA: Insufficient documentation

## 2018-11-23 DIAGNOSIS — Z7951 Long term (current) use of inhaled steroids: Secondary | ICD-10-CM | POA: Insufficient documentation

## 2018-11-23 DIAGNOSIS — Z888 Allergy status to other drugs, medicaments and biological substances status: Secondary | ICD-10-CM | POA: Insufficient documentation

## 2018-11-23 DIAGNOSIS — F419 Anxiety disorder, unspecified: Secondary | ICD-10-CM | POA: Diagnosis not present

## 2018-11-23 DIAGNOSIS — I132 Hypertensive heart and chronic kidney disease with heart failure and with stage 5 chronic kidney disease, or end stage renal disease: Secondary | ICD-10-CM | POA: Diagnosis not present

## 2018-11-23 DIAGNOSIS — N186 End stage renal disease: Secondary | ICD-10-CM | POA: Diagnosis not present

## 2018-11-23 DIAGNOSIS — Z90710 Acquired absence of both cervix and uterus: Secondary | ICD-10-CM | POA: Diagnosis not present

## 2018-11-23 DIAGNOSIS — I5032 Chronic diastolic (congestive) heart failure: Secondary | ICD-10-CM | POA: Diagnosis not present

## 2018-11-23 DIAGNOSIS — Z940 Kidney transplant status: Secondary | ICD-10-CM | POA: Insufficient documentation

## 2018-11-23 DIAGNOSIS — Z79899 Other long term (current) drug therapy: Secondary | ICD-10-CM | POA: Insufficient documentation

## 2018-11-23 DIAGNOSIS — Z23 Encounter for immunization: Secondary | ICD-10-CM | POA: Diagnosis not present

## 2018-11-23 HISTORY — PX: REVISON OF ARTERIOVENOUS FISTULA: SHX6074

## 2018-11-23 HISTORY — DX: Personal history of other medical treatment: Z92.89

## 2018-11-23 LAB — GLUCOSE, CAPILLARY
Glucose-Capillary: 113 mg/dL — ABNORMAL HIGH (ref 70–99)
Glucose-Capillary: 124 mg/dL — ABNORMAL HIGH (ref 70–99)

## 2018-11-23 LAB — POCT I-STAT 4, (NA,K, GLUC, HGB,HCT)
Glucose, Bld: 122 mg/dL — ABNORMAL HIGH (ref 70–99)
HCT: 37 % (ref 36.0–46.0)
Hemoglobin: 12.6 g/dL (ref 12.0–15.0)
Potassium: 3.9 mmol/L (ref 3.5–5.1)
Sodium: 136 mmol/L (ref 135–145)

## 2018-11-23 SURGERY — REVISON OF ARTERIOVENOUS FISTULA
Anesthesia: General | Laterality: Left

## 2018-11-23 MED ORDER — SODIUM CHLORIDE 0.9 % IV SOLN
INTRAVENOUS | Status: DC | PRN
  Administered 2018-11-23: 50 ug/min via INTRAVENOUS

## 2018-11-23 MED ORDER — 0.9 % SODIUM CHLORIDE (POUR BTL) OPTIME
TOPICAL | Status: DC | PRN
  Administered 2018-11-23: 1000 mL

## 2018-11-23 MED ORDER — PROPOFOL 10 MG/ML IV BOLUS
INTRAVENOUS | Status: DC | PRN
  Administered 2018-11-23: 150 mg via INTRAVENOUS

## 2018-11-23 MED ORDER — LIDOCAINE HCL 1 % IJ SOLN
INTRAMUSCULAR | Status: AC
  Filled 2018-11-23: qty 20

## 2018-11-23 MED ORDER — HEPARIN SODIUM (PORCINE) 1000 UNIT/ML IJ SOLN
INTRAMUSCULAR | Status: DC | PRN
  Administered 2018-11-23: 3000 [IU] via INTRAVENOUS

## 2018-11-23 MED ORDER — FENTANYL CITRATE (PF) 250 MCG/5ML IJ SOLN
INTRAMUSCULAR | Status: DC | PRN
  Administered 2018-11-23: 50 ug via INTRAVENOUS

## 2018-11-23 MED ORDER — MIDAZOLAM HCL 5 MG/5ML IJ SOLN
INTRAMUSCULAR | Status: DC | PRN
  Administered 2018-11-23: 2 mg via INTRAVENOUS

## 2018-11-23 MED ORDER — FENTANYL CITRATE (PF) 100 MCG/2ML IJ SOLN: 25.0000 ug | INTRAMUSCULAR | Status: DC | PRN

## 2018-11-23 MED ORDER — ACETAMINOPHEN 10 MG/ML IV SOLN
1000.0000 mg | Freq: Once | INTRAVENOUS | Status: DC | PRN
  Administered 2018-11-23: 10:00:00 1000 mg via INTRAVENOUS

## 2018-11-23 MED ORDER — ACETAMINOPHEN 10 MG/ML IV SOLN
INTRAVENOUS | Status: AC
  Administered 2018-11-23: 10:00:00 1000 mg via INTRAVENOUS
  Filled 2018-11-23: qty 100

## 2018-11-23 MED ORDER — PROPOFOL 10 MG/ML IV BOLUS
INTRAVENOUS | Status: AC
  Filled 2018-11-23: qty 40

## 2018-11-23 MED ORDER — MIDAZOLAM HCL 2 MG/2ML IJ SOLN
INTRAMUSCULAR | Status: AC
  Filled 2018-11-23: qty 2

## 2018-11-23 MED ORDER — DEXAMETHASONE SODIUM PHOSPHATE 10 MG/ML IJ SOLN
INTRAMUSCULAR | Status: DC | PRN
  Administered 2018-11-23: 8 mg via INTRAVENOUS

## 2018-11-23 MED ORDER — CEFAZOLIN SODIUM-DEXTROSE 2-4 GM/100ML-% IV SOLN
2.0000 g | INTRAVENOUS | Status: AC
  Administered 2018-11-23: 2 g via INTRAVENOUS

## 2018-11-23 MED ORDER — SODIUM CHLORIDE 0.9 % IV SOLN
INTRAVENOUS | Status: DC
  Administered 2018-11-23: 08:00:00 via INTRAVENOUS

## 2018-11-23 MED ORDER — PROMETHAZINE HCL 25 MG/ML IJ SOLN: 6.2500 mg | INTRAMUSCULAR | Status: DC | PRN

## 2018-11-23 MED ORDER — FENTANYL CITRATE (PF) 250 MCG/5ML IJ SOLN
INTRAMUSCULAR | Status: AC
  Filled 2018-11-23: qty 5

## 2018-11-23 MED ORDER — SODIUM CHLORIDE 0.9 % IV SOLN
INTRAVENOUS | Status: AC
  Filled 2018-11-23: qty 1.2

## 2018-11-23 MED ORDER — LIDOCAINE HCL (PF) 1 % IJ SOLN
INTRAMUSCULAR | Status: AC
  Filled 2018-11-23: qty 30

## 2018-11-23 MED ORDER — ONDANSETRON HCL 4 MG/2ML IJ SOLN
INTRAMUSCULAR | Status: DC | PRN
  Administered 2018-11-23: 4 mg via INTRAVENOUS

## 2018-11-23 MED ORDER — LIDOCAINE HCL (CARDIAC) PF 100 MG/5ML IV SOSY
PREFILLED_SYRINGE | INTRAVENOUS | Status: DC | PRN
  Administered 2018-11-23 (×2): 40 mg via INTRAVENOUS

## 2018-11-23 MED ORDER — OXYCODONE-ACETAMINOPHEN 5-325 MG PO TABS: 1.0000 | ORAL_TABLET | Freq: Four times a day (QID) | ORAL | 0 refills | Status: DC | PRN

## 2018-11-23 MED ORDER — SODIUM CHLORIDE 0.9 % IV SOLN
INTRAVENOUS | Status: DC | PRN
  Administered 2018-11-23: 500 mL

## 2018-11-23 SURGICAL SUPPLY — 35 items
ADH SKN CLS APL DERMABOND .7 (GAUZE/BANDAGES/DRESSINGS) ×1
ARMBAND PINK RESTRICT EXTREMIT (MISCELLANEOUS) ×2 IMPLANT
BNDG CMPR 9X4 STRL LF SNTH (GAUZE/BANDAGES/DRESSINGS) ×1
BNDG ESMARK 4X9 LF (GAUZE/BANDAGES/DRESSINGS) ×1 IMPLANT
CANISTER SUCT 3000ML PPV (MISCELLANEOUS) ×2 IMPLANT
CLIP VESOCCLUDE MED 6/CT (CLIP) ×2 IMPLANT
CLIP VESOCCLUDE SM WIDE 6/CT (CLIP) ×2 IMPLANT
COVER PROBE W GEL 5X96 (DRAPES) ×2 IMPLANT
COVER WAND RF STERILE (DRAPES) ×2 IMPLANT
CUFF TOURN SGL QUICK 18X4 (TOURNIQUET CUFF) ×1 IMPLANT
DERMABOND ADVANCED (GAUZE/BANDAGES/DRESSINGS) ×1
DERMABOND ADVANCED .7 DNX12 (GAUZE/BANDAGES/DRESSINGS) ×1 IMPLANT
ELECT REM PT RETURN 9FT ADLT (ELECTROSURGICAL) ×2
ELECTRODE REM PT RTRN 9FT ADLT (ELECTROSURGICAL) ×1 IMPLANT
GLOVE BIO SURGEON STRL SZ 6.5 (GLOVE) ×4 IMPLANT
GLOVE BIO SURGEON STRL SZ7.5 (GLOVE) ×2 IMPLANT
GLOVE INDICATOR 7.0 STRL GRN (GLOVE) ×1 IMPLANT
GLOVE SURG SS PI 6.5 STRL IVOR (GLOVE) ×1 IMPLANT
GOWN STRL REUS W/ TWL LRG LVL3 (GOWN DISPOSABLE) ×2 IMPLANT
GOWN STRL REUS W/ TWL XL LVL3 (GOWN DISPOSABLE) ×1 IMPLANT
GOWN STRL REUS W/TWL LRG LVL3 (GOWN DISPOSABLE) ×4
GOWN STRL REUS W/TWL XL LVL3 (GOWN DISPOSABLE) ×2
KIT BASIN OR (CUSTOM PROCEDURE TRAY) ×2 IMPLANT
KIT TURNOVER KIT B (KITS) ×2 IMPLANT
NS IRRIG 1000ML POUR BTL (IV SOLUTION) ×2 IMPLANT
PACK CV ACCESS (CUSTOM PROCEDURE TRAY) ×2 IMPLANT
PAD ARMBOARD 7.5X6 YLW CONV (MISCELLANEOUS) ×4 IMPLANT
SUT MNCRL AB 4-0 PS2 18 (SUTURE) ×2 IMPLANT
SUT PROLENE 5 0 C 1 24 (SUTURE) ×2 IMPLANT
SUT PROLENE 6 0 BV (SUTURE) ×2 IMPLANT
SUT VIC AB 3-0 SH 27 (SUTURE) ×2
SUT VIC AB 3-0 SH 27X BRD (SUTURE) ×1 IMPLANT
TOWEL GREEN STERILE (TOWEL DISPOSABLE) ×2 IMPLANT
UNDERPAD 30X30 (UNDERPADS AND DIAPERS) ×2 IMPLANT
WATER STERILE IRR 1000ML POUR (IV SOLUTION) ×2 IMPLANT

## 2018-11-23 NOTE — Telephone Encounter (Signed)
Current meter - one touch ultra 2   Patient called in stating that the pharmacy told her that her meter is out dated. Requesting that new script for meter and supplies be sent to pharmacy.

## 2018-11-23 NOTE — Anesthesia Procedure Notes (Signed)
Procedure Name: LMA Insertion Date/Time: 11/23/2018 9:15 AM Performed by: Mariea Clonts, CRNA Pre-anesthesia Checklist: Patient identified, Emergency Drugs available, Suction available and Patient being monitored Patient Re-evaluated:Patient Re-evaluated prior to induction Oxygen Delivery Method: Circle System Utilized Preoxygenation: Pre-oxygenation with 100% oxygen Induction Type: IV induction Ventilation: Mask ventilation without difficulty LMA: LMA inserted LMA Size: 4.0 Number of attempts: 1 Airway Equipment and Method: Bite block Placement Confirmation: positive ETCO2 Tube secured with: Tape Dental Injury: Teeth and Oropharynx as per pre-operative assessment

## 2018-11-23 NOTE — Op Note (Signed)
    Patient name: [name] [name] MRN: [id number] DOB: [birth year] Sex: female  11/23/2018 Pre-operative Diagnosis: End-stage renal disease, ulceration left arm AV fistula Post-operative diagnosis:  Same Surgeon:  Eda Paschal. Donzetta Matters, MD Assistant: Leontine Locket, PA Procedure Performed: Revision of left arm AV fistula with plication of pseudoaneurysm  Indications: 63 year old female with history of left arm AV fistula that she uses for dialysis.  She has 2 pseudoaneurysms 1 with very thin skin causes her pain.  She is now indicated for revision.  Findings: Pseudoaneurysm was plicated to normal appearing fistula completion there was a strong thrill in the fistula.   Procedure:  The patient was identified in the holding area and taken to the operating room where she is placed upon operative table and LMA anesthesia induced.  She was sterilely prepped draped left upper extremity usual fashion antibiotics were minister and timeout was called.  3000 units of heparin was administered.  A tourniquet was placed above the ulcerated area Esmarch was used to exsanguinate the arm and tourniquet was inflated 250 mmHg.  An elliptical type incision was made around the fistula this was excised with scissors.  We mobilized the fistula itself.  We irrigated with heparinized saline closed with running 5-0 Prolene suture.  Upon completion we released the tourniquet.  We did have 2 areas to repair.  We obtain hemostasis and irrigated.  We closed the skin with 4 Monocryl suture.  She was awakened anesthesia having tolerated procedure well immediate complication.  EBL: 100 cc   Maat Kafer C. Donzetta Matters, MD [facility] Office: [phone] Pager: [phone]

## 2018-11-23 NOTE — Anesthesia Postprocedure Evaluation (Signed)
Anesthesia Post Note  Patient: [name] [name]  Procedure(s) Performed: REVISION PLICATION OF ARTERIOVENOUS FISTULA (Left )     Patient location during evaluation: PACU Anesthesia Type: General Level of consciousness: awake and alert and oriented Pain management: pain level controlled Vital Signs Assessment: post-procedure vital signs reviewed and stable Respiratory status: spontaneous breathing, nonlabored ventilation and respiratory function stable Cardiovascular status: blood pressure returned to baseline Postop Assessment: no apparent nausea or vomiting Anesthetic complications: no    Last Vitals:  Vitals:   11/23/18 1020 11/23/18 1030  BP: 135/89   Pulse: 77 74  Resp: 10 14  Temp:    SpO2: 100% 99%    Last Pain:  Vitals:   11/23/18 1030  TempSrc:   PainSc: [place]

## 2018-11-23 NOTE — H&P (Signed)
   History and Physical Update  The patient was interviewed and re-examined.  The patient's previous History and Physical has been reviewed and is unchanged from recent office visit. Plan for left arm avf revision in OR today.  [name] Aron C. Donzetta Matters, MD [facility] Office: [phone] Pager: [phone]   11/23/2018, 7:22 AM

## 2018-11-23 NOTE — Transfer of Care (Signed)
Immediate Anesthesia Transfer of Care Note  Patient: [name] [name]  Procedure(s) Performed: REVISION PLICATION OF ARTERIOVENOUS FISTULA (Left )  Patient Location: PACU  Anesthesia Type:General  Level of Consciousness: awake, alert  and oriented  Airway & Oxygen Therapy: Patient Spontanous Breathing and Patient connected to nasal cannula oxygen  Post-op Assessment: Report given to RN and Post -op Vital signs reviewed and stable  Post vital signs: Reviewed and stable  Last Vitals:  Vitals Value Taken Time  BP 120/71 11/23/18 1005  Temp    Pulse 76 11/23/18 1007  Resp 15 11/23/18 1007  SpO2 100 % 11/23/18 1007  Vitals shown include unvalidated device data.  Last Pain:  Vitals:   11/23/18 0734  TempSrc:   PainSc: 0-No pain         Complications: No apparent anesthesia complications

## 2018-11-24 ENCOUNTER — Encounter (HOSPITAL_COMMUNITY): Payer: Self-pay | Admitting: Vascular Surgery

## 2018-11-24 DIAGNOSIS — N186 End stage renal disease: Secondary | ICD-10-CM | POA: Diagnosis not present

## 2018-11-24 DIAGNOSIS — Z992 Dependence on renal dialysis: Secondary | ICD-10-CM | POA: Diagnosis not present

## 2018-11-24 DIAGNOSIS — D509 Iron deficiency anemia, unspecified: Secondary | ICD-10-CM | POA: Diagnosis not present

## 2018-11-24 DIAGNOSIS — E1129 Type 2 diabetes mellitus with other diabetic kidney complication: Secondary | ICD-10-CM | POA: Diagnosis not present

## 2018-11-24 DIAGNOSIS — N2581 Secondary hyperparathyroidism of renal origin: Secondary | ICD-10-CM | POA: Diagnosis not present

## 2018-11-24 DIAGNOSIS — Z23 Encounter for immunization: Secondary | ICD-10-CM | POA: Diagnosis not present

## 2018-11-24 MED ORDER — BLOOD GLUCOSE MONITOR KIT: PACK | 0 refills | Status: DC

## 2018-11-24 NOTE — Telephone Encounter (Signed)
We can send in new meter and strips for pt.  She should check twice daily

## 2018-11-24 NOTE — Telephone Encounter (Signed)
Pt made aware, scheduled for 9/15 for a Doxy visit as she refused an in office appt.   Pt states that when she gets her sugars checked they are in the 130s. When she gets home and checks on her glucometer they are reading in the 800s and her strips are expired. Please advise.

## 2018-11-24 NOTE — Telephone Encounter (Signed)
Glucometer/lancet/strip rx printed and faxed to pharmacy.

## 2018-11-24 NOTE — Addendum Note (Signed)
Addended by: Davis Gourd on: 11/24/2018 10:36 AM   Modules accepted: Orders

## 2018-11-24 NOTE — Telephone Encounter (Signed)
Pt is overdue for appt and will need a diabetes follow up before we send in new meters or supplies

## 2018-11-26 DIAGNOSIS — D509 Iron deficiency anemia, unspecified: Secondary | ICD-10-CM | POA: Diagnosis not present

## 2018-11-26 DIAGNOSIS — N2581 Secondary hyperparathyroidism of renal origin: Secondary | ICD-10-CM | POA: Diagnosis not present

## 2018-11-26 DIAGNOSIS — E1129 Type 2 diabetes mellitus with other diabetic kidney complication: Secondary | ICD-10-CM | POA: Diagnosis not present

## 2018-11-26 DIAGNOSIS — Z992 Dependence on renal dialysis: Secondary | ICD-10-CM | POA: Diagnosis not present

## 2018-11-26 DIAGNOSIS — N186 End stage renal disease: Secondary | ICD-10-CM | POA: Diagnosis not present

## 2018-11-26 DIAGNOSIS — Z23 Encounter for immunization: Secondary | ICD-10-CM | POA: Diagnosis not present

## 2018-11-29 DIAGNOSIS — N2581 Secondary hyperparathyroidism of renal origin: Secondary | ICD-10-CM | POA: Diagnosis not present

## 2018-11-29 DIAGNOSIS — Z23 Encounter for immunization: Secondary | ICD-10-CM | POA: Diagnosis not present

## 2018-11-29 DIAGNOSIS — D509 Iron deficiency anemia, unspecified: Secondary | ICD-10-CM | POA: Diagnosis not present

## 2018-11-29 DIAGNOSIS — E1129 Type 2 diabetes mellitus with other diabetic kidney complication: Secondary | ICD-10-CM | POA: Diagnosis not present

## 2018-11-29 DIAGNOSIS — N186 End stage renal disease: Secondary | ICD-10-CM | POA: Diagnosis not present

## 2018-11-29 DIAGNOSIS — Z992 Dependence on renal dialysis: Secondary | ICD-10-CM | POA: Diagnosis not present

## 2018-12-01 DIAGNOSIS — E1129 Type 2 diabetes mellitus with other diabetic kidney complication: Secondary | ICD-10-CM | POA: Diagnosis not present

## 2018-12-01 DIAGNOSIS — N2581 Secondary hyperparathyroidism of renal origin: Secondary | ICD-10-CM | POA: Diagnosis not present

## 2018-12-01 DIAGNOSIS — Z23 Encounter for immunization: Secondary | ICD-10-CM | POA: Diagnosis not present

## 2018-12-01 DIAGNOSIS — N186 End stage renal disease: Secondary | ICD-10-CM | POA: Diagnosis not present

## 2018-12-01 DIAGNOSIS — Z992 Dependence on renal dialysis: Secondary | ICD-10-CM | POA: Diagnosis not present

## 2018-12-01 DIAGNOSIS — D509 Iron deficiency anemia, unspecified: Secondary | ICD-10-CM | POA: Diagnosis not present

## 2018-12-03 DIAGNOSIS — Z992 Dependence on renal dialysis: Secondary | ICD-10-CM | POA: Diagnosis not present

## 2018-12-03 DIAGNOSIS — N186 End stage renal disease: Secondary | ICD-10-CM | POA: Diagnosis not present

## 2018-12-03 DIAGNOSIS — Z23 Encounter for immunization: Secondary | ICD-10-CM | POA: Diagnosis not present

## 2018-12-03 DIAGNOSIS — N2581 Secondary hyperparathyroidism of renal origin: Secondary | ICD-10-CM | POA: Diagnosis not present

## 2018-12-03 DIAGNOSIS — E1129 Type 2 diabetes mellitus with other diabetic kidney complication: Secondary | ICD-10-CM | POA: Diagnosis not present

## 2018-12-03 DIAGNOSIS — D509 Iron deficiency anemia, unspecified: Secondary | ICD-10-CM | POA: Diagnosis not present

## 2018-12-06 DIAGNOSIS — Z992 Dependence on renal dialysis: Secondary | ICD-10-CM | POA: Diagnosis not present

## 2018-12-06 DIAGNOSIS — Z23 Encounter for immunization: Secondary | ICD-10-CM | POA: Diagnosis not present

## 2018-12-06 DIAGNOSIS — N186 End stage renal disease: Secondary | ICD-10-CM | POA: Diagnosis not present

## 2018-12-06 DIAGNOSIS — E1129 Type 2 diabetes mellitus with other diabetic kidney complication: Secondary | ICD-10-CM | POA: Diagnosis not present

## 2018-12-06 DIAGNOSIS — D509 Iron deficiency anemia, unspecified: Secondary | ICD-10-CM | POA: Diagnosis not present

## 2018-12-06 DIAGNOSIS — N2581 Secondary hyperparathyroidism of renal origin: Secondary | ICD-10-CM | POA: Diagnosis not present

## 2018-12-07 ENCOUNTER — Ambulatory Visit (INDEPENDENT_AMBULATORY_CARE_PROVIDER_SITE_OTHER): Payer: Medicare Other | Admitting: Family Medicine

## 2018-12-07 ENCOUNTER — Other Ambulatory Visit: Payer: Self-pay

## 2018-12-07 ENCOUNTER — Telehealth: Payer: Self-pay | Admitting: Family Medicine

## 2018-12-07 ENCOUNTER — Encounter: Payer: Self-pay | Admitting: Family Medicine

## 2018-12-07 DIAGNOSIS — I739 Peripheral vascular disease, unspecified: Secondary | ICD-10-CM

## 2018-12-07 DIAGNOSIS — I10 Essential (primary) hypertension: Secondary | ICD-10-CM

## 2018-12-07 DIAGNOSIS — E1122 Type 2 diabetes mellitus with diabetic chronic kidney disease: Secondary | ICD-10-CM | POA: Diagnosis not present

## 2018-12-07 DIAGNOSIS — E7849 Other hyperlipidemia: Secondary | ICD-10-CM | POA: Diagnosis not present

## 2018-12-07 DIAGNOSIS — Z992 Dependence on renal dialysis: Secondary | ICD-10-CM

## 2018-12-07 DIAGNOSIS — I779 Disorder of arteries and arterioles, unspecified: Secondary | ICD-10-CM

## 2018-12-07 DIAGNOSIS — N186 End stage renal disease: Secondary | ICD-10-CM

## 2018-12-07 MED ORDER — GABAPENTIN 300 MG PO CAPS: 300.0000 mg | ORAL_CAPSULE | Freq: Three times a day (TID) | ORAL | 3 refills | Status: DC

## 2018-12-07 MED ORDER — MECLIZINE HCL 25 MG PO TABS: 25.0000 mg | ORAL_TABLET | Freq: Three times a day (TID) | ORAL | 3 refills | Status: DC | PRN

## 2018-12-07 NOTE — Progress Notes (Signed)
I have discussed the procedure for the virtual visit with the patient who has given consent to proceed with assessment and treatment.   Pt unable to obtain vitals.   Brandalyn Harting L Terriann Difonzo, CMA     

## 2018-12-07 NOTE — Assessment & Plan Note (Signed)
Following w/ Dr Donzetta Matters at [facility]

## 2018-12-07 NOTE — Assessment & Plan Note (Signed)
Chronic problem.  Unable to check BP today but pt reports BP has been controlled at HD.  Check labs.  No anticipated med changes.

## 2018-12-07 NOTE — Progress Notes (Signed)
Virtual Visit via Video   I connected with patient on 12/07/18 at 11:30 AM EDT by a video enabled telemedicine application and verified that I am speaking with the correct person using two identifiers.  Location patient: Home Location provider: Acupuncturist, Office Persons participating in the virtual visit: Patient, Provider, [place] (Jess B)  I discussed the limitations of evaluation and management by telemedicine and the availability of in person appointments. The patient expressed understanding and agreed to proceed.  Subjective:   HPI:   DM- chronic problem, not currently on medication.  UTD on eye exam, foot exam.  No need for microalbumin due to HD.  Sugars at HD are 130s.  Denies symptomatic lows.  + neuropathy.  Taking gabapentin 12m QHS but continues to have sxs, particularly at night  HTN- chronic problem, on Isordil 232mTID, Amlodipine 1020maily.  Not able to get BP today.  No CP, SOB, HAs, visual changes.  Hyperlipidemia- chronic problem, on Lipitor 38m28mily.  No abd pain, N/V.  ROS:   See pertinent positives and negatives per HPI.  Patient Active Problem List   Diagnosis Date Noted  . Stress incontinence 12/24/2017  . Cough variant asthma 12/24/2017  . Tremor of left hand 12/24/2017  . History of internal jugular thrombosis 03/26/2017  . 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 Chronic diastolic CHF (congestive heart failure) (HCC)[place]/17/2018  . Anemia 07/08/2016  . Abnormal brain MRI 07/03/2016  . Fasciculation 06/18/2016  . Carotid artery disease (HCC)[place]. Physical exam 01/28/2016  . Hyperlipidemia 06/28/2015  . Diabetes mellitus type II, controlled (HCC)[place]/08/2015  . Thoracic ascending aortic aneurysm (41 mm on Echo 06/2013) 07/06/2013  . ESRD on dialysis (HCC)[place]/26/2015  . History of renal transplant 06/16/2013  . GERD (gastroesophageal reflux disease) 08/13/2009  . OTH&UNSPEC NONINFECTIOUS  GASTROENTERITIS&COLITIS 08/13/2009  . Essential hypertension 04/25/2009  . Hemorrhoids 04/25/2009  . WEIGHT LOSS 04/25/2009  . NAUSEA AND VOMITING 04/25/2009  . PERSONAL HX COLONIC POLYPS 04/25/2009    Social History   Tobacco Use  . Smoking status: Former [employer] scientist (life sciences)Smokeless tobacco: Never Used  . Tobacco comment: smoked in teens  Substance Use Topics  . Alcohol use: No    Current Outpatient Medications:  .  acetaminophen (TYLENOL) 650 MG CR tablet, Take 650-1,300 mg by mouth every 8 (eight) hours as needed for pain. , Disp: , Rfl:  .  albuterol (VENTOLIN HFA) 108 (90 Base) MCG/ACT inhaler, Inhale 2 puffs into the lungs every 4 (four) hours as needed for wheezing or shortness of breath., Disp: 1 Inhaler, Rfl: 3 .  amLODipine (NORVASC) 10 MG tablet, TAKE 1 TABLET BY MOUTH EVERY DAY, Disp: 90 tablet, Rfl: 1 .  atorvastatin (LIPITOR) 40 MG tablet, TAKE 1 TABLET BY MOUTH EVERY DAY, Disp: 90 tablet, Rfl: 1 .  blood glucose meter kit and supplies KIT, Dispense based on patient and insurance preference. Patient should test sugars twice daily as directed. Dx. E11.9, Disp: 1 each, Rfl: 0 .  cetirizine (ZYRTEC) 10 MG tablet, TAKE 1 TABLET BY MOUTH EVERY DAY, Disp: 30 tablet, Rfl: 11 .  clonazePAM (KLONOPIN) 0.5 MG tablet, TAKE 1 TABLET BY MOUTH EVERYDAY AT BEDTIME, Disp: 30 tablet, Rfl: 3 .  ferric citrate (AURYXIA) 1 GM 210 MG(Fe) tablet, Take 210 mg by mouth See admin instructions. Take 210 mg by mouth three times a day with meals and 210 mg two times a day with snacks, Disp: , Rfl:  .  FLOVENT DISKUS 250 MCG/BLIST AEPB, Please specify directions, refills and quantity, Disp: 60 each, Rfl: 3 .  gabapentin (NEURONTIN) 100 MG capsule, TAKE 1 CAPSULE (100 MG TOTAL) BY MOUTH AT BEDTIME., Disp: 90 capsule, Rfl: 1 .  isosorbide dinitrate (ISORDIL) 20 MG tablet, TAKE 1 TABLET BY MOUTH THREE TIMES A DAY, Disp: 270 tablet, Rfl: 2 .  lidocaine-prilocaine (EMLA) cream, Apply 1 application topically as  directed. Apply small amount to access site (AVF) 1 to 2 hrs before Dialysis. Cover with occlusive dressing., Disp: , Rfl: 3 .  loratadine (CLARITIN) 10 MG tablet, Take 1 tablet (10 mg total) by mouth daily., Disp: 30 tablet, Rfl: 11 .  meclizine (ANTIVERT) 25 MG tablet, TAKE 1 TABLET (25 MG TOTAL) BY MOUTH 3 (THREE) TIMES DAILY AS NEEDED FOR DIZZINESS., Disp: 45 tablet, Rfl: 0 .  methylPREDNISolone (MEDROL DOSEPAK) 4 MG TBPK tablet, Use as directed, Disp: 21 tablet, Rfl: 0 .  multivitamin (RENA-VIT) TABS tablet, Take 1 tablet by mouth every morning. , Disp: , Rfl: 11 .  nitroGLYCERIN (NITROSTAT) 0.4 MG SL tablet, , Disp: , Rfl:  .  omeprazole (PRILOSEC) 40 MG capsule, TAKE 1 CAPSULE BY MOUTH EVERY DAY, Disp: 90 capsule, Rfl: 1 .  oxyCODONE-acetaminophen (PERCOCET) 5-325 MG tablet, Take 1 tablet by mouth every 6 (six) hours as needed., Disp: 8 tablet, Rfl: 0 .  QUEtiapine (SEROQUEL) 100 MG tablet, TAKE 1 TABLET BY MOUTH EVERYDAY AT BEDTIME, Disp: 90 tablet, Rfl: 1 .  traZODone (DESYREL) 50 MG tablet, TAKE 0.5-1 TABLETS (25-50 MG TOTAL) BY MOUTH AT BEDTIME AS NEEDED FOR SLEEP., Disp: 90 tablet, Rfl: 1  Current Facility-Administered Medications:  .  methylPREDNISolone acetate (DEPO-MEDROL) injection 40 mg, 40 mg, Intra-articular, Once, Hilts, Michael, MD  Allergies  Allergen Reactions  . Lisinopril Shortness Of Breath, Swelling and Other (See Comments)    Throat irritation also. Patient takes losartan and tolerates fine.  [name] [Oxaprozin] Hives and Dermatitis    Objective:   There were no vitals taken for this visit. AAOx3, NAD NCAT, EOMI No obvious CN deficits Coloring WNL Pt is able to speak clearly, coherently without shortness of breath or increased work of breathing.  Thought process is linear.  Mood is appropriate.   Assessment and Plan:   See Problem Based Charting   Annye Asa, MD 12/07/2018

## 2018-12-07 NOTE — Assessment & Plan Note (Signed)
Chronic problem.  Pt reports CBGs are well controlled.  Asymptomatic w/ exception of chronic neuropathy.  Increase Gabapentin to 300mg  nightly.  Check labs.  Start meds PRN.

## 2018-12-07 NOTE — Assessment & Plan Note (Signed)
Chronic problem.  Tolerating statin w/o difficulty.  Check labs.  Adjust meds prn  

## 2018-12-07 NOTE — Telephone Encounter (Signed)
Pt called in asking for a script for testing strips for a one step meter to be sent in for the pt. She left a VM didn't give a pharmacy and when I returned her phone call  I got no answer. Pt can be reached at the home #

## 2018-12-08 DIAGNOSIS — Z992 Dependence on renal dialysis: Secondary | ICD-10-CM | POA: Diagnosis not present

## 2018-12-08 DIAGNOSIS — D509 Iron deficiency anemia, unspecified: Secondary | ICD-10-CM | POA: Diagnosis not present

## 2018-12-08 DIAGNOSIS — E1129 Type 2 diabetes mellitus with other diabetic kidney complication: Secondary | ICD-10-CM | POA: Diagnosis not present

## 2018-12-08 DIAGNOSIS — N2581 Secondary hyperparathyroidism of renal origin: Secondary | ICD-10-CM | POA: Diagnosis not present

## 2018-12-08 DIAGNOSIS — N186 End stage renal disease: Secondary | ICD-10-CM | POA: Diagnosis not present

## 2018-12-08 DIAGNOSIS — Z23 Encounter for immunization: Secondary | ICD-10-CM | POA: Diagnosis not present

## 2018-12-08 NOTE — Telephone Encounter (Signed)
Please advise how many times a day would you like pt to test?

## 2018-12-08 NOTE — Telephone Encounter (Signed)
This was actually faxed to her local pharmacy on 11/24/18

## 2018-12-08 NOTE — Telephone Encounter (Signed)
Twice daily.

## 2018-12-10 ENCOUNTER — Other Ambulatory Visit: Payer: Self-pay | Admitting: General Practice

## 2018-12-10 DIAGNOSIS — Z992 Dependence on renal dialysis: Secondary | ICD-10-CM | POA: Diagnosis not present

## 2018-12-10 DIAGNOSIS — E1129 Type 2 diabetes mellitus with other diabetic kidney complication: Secondary | ICD-10-CM | POA: Diagnosis not present

## 2018-12-10 DIAGNOSIS — N186 End stage renal disease: Secondary | ICD-10-CM | POA: Diagnosis not present

## 2018-12-10 DIAGNOSIS — N2581 Secondary hyperparathyroidism of renal origin: Secondary | ICD-10-CM | POA: Diagnosis not present

## 2018-12-10 DIAGNOSIS — Z23 Encounter for immunization: Secondary | ICD-10-CM | POA: Diagnosis not present

## 2018-12-10 DIAGNOSIS — D509 Iron deficiency anemia, unspecified: Secondary | ICD-10-CM | POA: Diagnosis not present

## 2018-12-10 MED ORDER — BLOOD GLUCOSE MONITOR KIT: PACK | 0 refills | Status: AC

## 2018-12-13 DIAGNOSIS — N2581 Secondary hyperparathyroidism of renal origin: Secondary | ICD-10-CM | POA: Diagnosis not present

## 2018-12-13 DIAGNOSIS — Z23 Encounter for immunization: Secondary | ICD-10-CM | POA: Diagnosis not present

## 2018-12-13 DIAGNOSIS — E1129 Type 2 diabetes mellitus with other diabetic kidney complication: Secondary | ICD-10-CM | POA: Diagnosis not present

## 2018-12-13 DIAGNOSIS — D509 Iron deficiency anemia, unspecified: Secondary | ICD-10-CM | POA: Diagnosis not present

## 2018-12-13 DIAGNOSIS — N186 End stage renal disease: Secondary | ICD-10-CM | POA: Diagnosis not present

## 2018-12-13 DIAGNOSIS — Z992 Dependence on renal dialysis: Secondary | ICD-10-CM | POA: Diagnosis not present

## 2018-12-14 ENCOUNTER — Telehealth: Payer: Self-pay | Admitting: Family Medicine

## 2018-12-14 DIAGNOSIS — G0481 Other encephalitis and encephalomyelitis: Secondary | ICD-10-CM

## 2018-12-14 NOTE — Telephone Encounter (Signed)
Pt called in stating that she is having some jerking in her hands. She states it is worse on the left then the right. She is having a hard time holding anything in her hands. She wanted to know what to do about this and pt can be reached at the home #

## 2018-12-14 NOTE — Telephone Encounter (Signed)
Pt has had the same symptoms in her chart since 12/2017. Please advise?

## 2018-12-15 DIAGNOSIS — N186 End stage renal disease: Secondary | ICD-10-CM | POA: Diagnosis not present

## 2018-12-15 DIAGNOSIS — Z992 Dependence on renal dialysis: Secondary | ICD-10-CM | POA: Diagnosis not present

## 2018-12-15 DIAGNOSIS — Z23 Encounter for immunization: Secondary | ICD-10-CM | POA: Diagnosis not present

## 2018-12-15 DIAGNOSIS — N2581 Secondary hyperparathyroidism of renal origin: Secondary | ICD-10-CM | POA: Diagnosis not present

## 2018-12-15 DIAGNOSIS — E1129 Type 2 diabetes mellitus with other diabetic kidney complication: Secondary | ICD-10-CM | POA: Diagnosis not present

## 2018-12-15 DIAGNOSIS — D509 Iron deficiency anemia, unspecified: Secondary | ICD-10-CM | POA: Diagnosis not present

## 2018-12-15 NOTE — Telephone Encounter (Signed)
Pt needs neuro referral.  She has previously seen [facility] and [facility] given her hx of NMDA receptor encephalitis

## 2018-12-15 NOTE — Telephone Encounter (Signed)
Referral placed and pt made aware. Asking for a brand new neurology referral.

## 2018-12-17 DIAGNOSIS — N186 End stage renal disease: Secondary | ICD-10-CM | POA: Diagnosis not present

## 2018-12-17 DIAGNOSIS — N2581 Secondary hyperparathyroidism of renal origin: Secondary | ICD-10-CM | POA: Diagnosis not present

## 2018-12-17 DIAGNOSIS — Z23 Encounter for immunization: Secondary | ICD-10-CM | POA: Diagnosis not present

## 2018-12-17 DIAGNOSIS — Z992 Dependence on renal dialysis: Secondary | ICD-10-CM | POA: Diagnosis not present

## 2018-12-17 DIAGNOSIS — E1129 Type 2 diabetes mellitus with other diabetic kidney complication: Secondary | ICD-10-CM | POA: Diagnosis not present

## 2018-12-17 DIAGNOSIS — D509 Iron deficiency anemia, unspecified: Secondary | ICD-10-CM | POA: Diagnosis not present

## 2018-12-19 NOTE — Assessment & Plan Note (Signed)
Chronic problem.  Tolerating statin w/o difficulty.  Check labs.  Adjust meds prn  

## 2018-12-19 NOTE — Assessment & Plan Note (Signed)
Pt has hx of this.  Was following w/ [facility].  Due for repeat appt.  Referral placed.

## 2018-12-19 NOTE — Assessment & Plan Note (Signed)
Chronic problem.  Adequate control.  Currently asymptomatic.  Check labs.  No anticipated med changes.  Will follow. 

## 2018-12-19 NOTE — Assessment & Plan Note (Signed)
Chronic problem. Currently diet controlled.  UTD on foot exam, due for eye exam.  Stressed need for healthy diet and regular exercise.  Check labs.  Adjust tx plan prn.

## 2018-12-20 DIAGNOSIS — Z992 Dependence on renal dialysis: Secondary | ICD-10-CM | POA: Diagnosis not present

## 2018-12-20 DIAGNOSIS — E1129 Type 2 diabetes mellitus with other diabetic kidney complication: Secondary | ICD-10-CM | POA: Diagnosis not present

## 2018-12-20 DIAGNOSIS — Z23 Encounter for immunization: Secondary | ICD-10-CM | POA: Diagnosis not present

## 2018-12-20 DIAGNOSIS — D509 Iron deficiency anemia, unspecified: Secondary | ICD-10-CM | POA: Diagnosis not present

## 2018-12-20 DIAGNOSIS — N186 End stage renal disease: Secondary | ICD-10-CM | POA: Diagnosis not present

## 2018-12-20 DIAGNOSIS — N2581 Secondary hyperparathyroidism of renal origin: Secondary | ICD-10-CM | POA: Diagnosis not present

## 2018-12-22 DIAGNOSIS — Z992 Dependence on renal dialysis: Secondary | ICD-10-CM | POA: Diagnosis not present

## 2018-12-22 DIAGNOSIS — E1129 Type 2 diabetes mellitus with other diabetic kidney complication: Secondary | ICD-10-CM | POA: Diagnosis not present

## 2018-12-22 DIAGNOSIS — Z23 Encounter for immunization: Secondary | ICD-10-CM | POA: Diagnosis not present

## 2018-12-22 DIAGNOSIS — D509 Iron deficiency anemia, unspecified: Secondary | ICD-10-CM | POA: Diagnosis not present

## 2018-12-22 DIAGNOSIS — N186 End stage renal disease: Secondary | ICD-10-CM | POA: Diagnosis not present

## 2018-12-22 DIAGNOSIS — N2581 Secondary hyperparathyroidism of renal origin: Secondary | ICD-10-CM | POA: Diagnosis not present

## 2018-12-23 DIAGNOSIS — Z992 Dependence on renal dialysis: Secondary | ICD-10-CM | POA: Diagnosis not present

## 2018-12-23 DIAGNOSIS — I150 Renovascular hypertension: Secondary | ICD-10-CM | POA: Diagnosis not present

## 2018-12-23 DIAGNOSIS — N186 End stage renal disease: Secondary | ICD-10-CM | POA: Diagnosis not present

## 2018-12-24 ENCOUNTER — Telehealth: Payer: Self-pay | Admitting: Family Medicine

## 2018-12-24 ENCOUNTER — Other Ambulatory Visit: Payer: Self-pay

## 2018-12-24 ENCOUNTER — Other Ambulatory Visit: Payer: Self-pay | Admitting: Family Medicine

## 2018-12-24 DIAGNOSIS — Z20822 Contact with and (suspected) exposure to covid-19: Secondary | ICD-10-CM

## 2018-12-24 DIAGNOSIS — E1129 Type 2 diabetes mellitus with other diabetic kidney complication: Secondary | ICD-10-CM | POA: Diagnosis not present

## 2018-12-24 DIAGNOSIS — N2581 Secondary hyperparathyroidism of renal origin: Secondary | ICD-10-CM | POA: Diagnosis not present

## 2018-12-24 DIAGNOSIS — D631 Anemia in chronic kidney disease: Secondary | ICD-10-CM | POA: Diagnosis not present

## 2018-12-24 DIAGNOSIS — Z992 Dependence on renal dialysis: Secondary | ICD-10-CM | POA: Diagnosis not present

## 2018-12-24 DIAGNOSIS — N186 End stage renal disease: Secondary | ICD-10-CM | POA: Diagnosis not present

## 2018-12-24 NOTE — Telephone Encounter (Signed)
Please advise the list that we have on pt is the most accurate correct?

## 2018-12-24 NOTE — Telephone Encounter (Signed)
[name] [name], dtr, called stating she is picking up the pt to bring her to [place] for a little bit. She is concerned pt is taking meds incorrectly and perhaps incorrect meds. She is asking for call today to go over pts med list. She said she had 10 pages in [place] and was confused.

## 2018-12-25 LAB — NOVEL CORONAVIRUS, NAA: SARS-CoV-2, NAA: NOT DETECTED

## 2018-12-28 DIAGNOSIS — N2581 Secondary hyperparathyroidism of renal origin: Secondary | ICD-10-CM | POA: Diagnosis not present

## 2018-12-28 DIAGNOSIS — N186 End stage renal disease: Secondary | ICD-10-CM | POA: Diagnosis not present

## 2018-12-28 DIAGNOSIS — D649 Anemia, unspecified: Secondary | ICD-10-CM | POA: Diagnosis not present

## 2018-12-28 DIAGNOSIS — Z992 Dependence on renal dialysis: Secondary | ICD-10-CM | POA: Diagnosis not present

## 2018-12-29 ENCOUNTER — Encounter: Payer: Self-pay | Admitting: General Practice

## 2018-12-29 NOTE — Telephone Encounter (Signed)
Called pt daughter back to discuss meds and call was dropped. Called again and no answer.

## 2018-12-29 NOTE — Telephone Encounter (Signed)
I updated her med list (removing the steroid and pain medication) on the paper copy and indicated which meds are PRN.

## 2018-12-29 NOTE — Telephone Encounter (Signed)
Called pt daughter and she advised that she had figured it out.

## 2018-12-30 DIAGNOSIS — N2581 Secondary hyperparathyroidism of renal origin: Secondary | ICD-10-CM | POA: Diagnosis not present

## 2018-12-30 DIAGNOSIS — Z992 Dependence on renal dialysis: Secondary | ICD-10-CM | POA: Diagnosis not present

## 2018-12-30 DIAGNOSIS — D649 Anemia, unspecified: Secondary | ICD-10-CM | POA: Diagnosis not present

## 2018-12-30 DIAGNOSIS — N186 End stage renal disease: Secondary | ICD-10-CM | POA: Diagnosis not present

## 2019-01-01 DIAGNOSIS — N2581 Secondary hyperparathyroidism of renal origin: Secondary | ICD-10-CM | POA: Diagnosis not present

## 2019-01-01 DIAGNOSIS — Z992 Dependence on renal dialysis: Secondary | ICD-10-CM | POA: Diagnosis not present

## 2019-01-01 DIAGNOSIS — D649 Anemia, unspecified: Secondary | ICD-10-CM | POA: Diagnosis not present

## 2019-01-01 DIAGNOSIS — N186 End stage renal disease: Secondary | ICD-10-CM | POA: Diagnosis not present

## 2019-01-03 DIAGNOSIS — E1129 Type 2 diabetes mellitus with other diabetic kidney complication: Secondary | ICD-10-CM | POA: Diagnosis not present

## 2019-01-03 DIAGNOSIS — N186 End stage renal disease: Secondary | ICD-10-CM | POA: Diagnosis not present

## 2019-01-03 DIAGNOSIS — N2581 Secondary hyperparathyroidism of renal origin: Secondary | ICD-10-CM | POA: Diagnosis not present

## 2019-01-03 DIAGNOSIS — Z992 Dependence on renal dialysis: Secondary | ICD-10-CM | POA: Diagnosis not present

## 2019-01-03 DIAGNOSIS — D631 Anemia in chronic kidney disease: Secondary | ICD-10-CM | POA: Diagnosis not present

## 2019-01-05 DIAGNOSIS — N186 End stage renal disease: Secondary | ICD-10-CM | POA: Diagnosis not present

## 2019-01-05 DIAGNOSIS — E1129 Type 2 diabetes mellitus with other diabetic kidney complication: Secondary | ICD-10-CM | POA: Diagnosis not present

## 2019-01-05 DIAGNOSIS — D631 Anemia in chronic kidney disease: Secondary | ICD-10-CM | POA: Diagnosis not present

## 2019-01-05 DIAGNOSIS — Z992 Dependence on renal dialysis: Secondary | ICD-10-CM | POA: Diagnosis not present

## 2019-01-05 DIAGNOSIS — N2581 Secondary hyperparathyroidism of renal origin: Secondary | ICD-10-CM | POA: Diagnosis not present

## 2019-01-07 DIAGNOSIS — E1129 Type 2 diabetes mellitus with other diabetic kidney complication: Secondary | ICD-10-CM | POA: Diagnosis not present

## 2019-01-07 DIAGNOSIS — N186 End stage renal disease: Secondary | ICD-10-CM | POA: Diagnosis not present

## 2019-01-07 DIAGNOSIS — Z992 Dependence on renal dialysis: Secondary | ICD-10-CM | POA: Diagnosis not present

## 2019-01-07 DIAGNOSIS — N2581 Secondary hyperparathyroidism of renal origin: Secondary | ICD-10-CM | POA: Diagnosis not present

## 2019-01-07 DIAGNOSIS — D631 Anemia in chronic kidney disease: Secondary | ICD-10-CM | POA: Diagnosis not present

## 2019-01-10 DIAGNOSIS — D631 Anemia in chronic kidney disease: Secondary | ICD-10-CM | POA: Diagnosis not present

## 2019-01-10 DIAGNOSIS — N2581 Secondary hyperparathyroidism of renal origin: Secondary | ICD-10-CM | POA: Diagnosis not present

## 2019-01-10 DIAGNOSIS — Z992 Dependence on renal dialysis: Secondary | ICD-10-CM | POA: Diagnosis not present

## 2019-01-10 DIAGNOSIS — N186 End stage renal disease: Secondary | ICD-10-CM | POA: Diagnosis not present

## 2019-01-10 DIAGNOSIS — E1129 Type 2 diabetes mellitus with other diabetic kidney complication: Secondary | ICD-10-CM | POA: Diagnosis not present

## 2019-01-11 ENCOUNTER — Other Ambulatory Visit: Payer: Self-pay | Admitting: Family Medicine

## 2019-01-11 NOTE — Telephone Encounter (Signed)
Please advise, this says dose was changed.

## 2019-01-11 NOTE — Telephone Encounter (Signed)
Pt is on 300mg  TID rather than 100mg .  Will decline this prescription

## 2019-01-12 DIAGNOSIS — R202 Paresthesia of skin: Secondary | ICD-10-CM | POA: Diagnosis not present

## 2019-01-12 DIAGNOSIS — D631 Anemia in chronic kidney disease: Secondary | ICD-10-CM | POA: Diagnosis not present

## 2019-01-12 DIAGNOSIS — N2581 Secondary hyperparathyroidism of renal origin: Secondary | ICD-10-CM | POA: Diagnosis not present

## 2019-01-12 DIAGNOSIS — G63 Polyneuropathy in diseases classified elsewhere: Secondary | ICD-10-CM | POA: Diagnosis not present

## 2019-01-12 DIAGNOSIS — R252 Cramp and spasm: Secondary | ICD-10-CM | POA: Diagnosis not present

## 2019-01-12 DIAGNOSIS — N186 End stage renal disease: Secondary | ICD-10-CM | POA: Diagnosis not present

## 2019-01-12 DIAGNOSIS — G0481 Other encephalitis and encephalomyelitis: Secondary | ICD-10-CM | POA: Diagnosis not present

## 2019-01-12 DIAGNOSIS — Z992 Dependence on renal dialysis: Secondary | ICD-10-CM | POA: Diagnosis not present

## 2019-01-12 DIAGNOSIS — R419 Unspecified symptoms and signs involving cognitive functions and awareness: Secondary | ICD-10-CM | POA: Diagnosis not present

## 2019-01-12 DIAGNOSIS — R200 Anesthesia of skin: Secondary | ICD-10-CM | POA: Diagnosis not present

## 2019-01-12 DIAGNOSIS — E1129 Type 2 diabetes mellitus with other diabetic kidney complication: Secondary | ICD-10-CM | POA: Diagnosis not present

## 2019-01-14 DIAGNOSIS — N186 End stage renal disease: Secondary | ICD-10-CM | POA: Diagnosis not present

## 2019-01-14 DIAGNOSIS — N2581 Secondary hyperparathyroidism of renal origin: Secondary | ICD-10-CM | POA: Diagnosis not present

## 2019-01-14 DIAGNOSIS — D631 Anemia in chronic kidney disease: Secondary | ICD-10-CM | POA: Diagnosis not present

## 2019-01-14 DIAGNOSIS — Z992 Dependence on renal dialysis: Secondary | ICD-10-CM | POA: Diagnosis not present

## 2019-01-14 DIAGNOSIS — E1129 Type 2 diabetes mellitus with other diabetic kidney complication: Secondary | ICD-10-CM | POA: Diagnosis not present

## 2019-01-17 DIAGNOSIS — D631 Anemia in chronic kidney disease: Secondary | ICD-10-CM | POA: Diagnosis not present

## 2019-01-17 DIAGNOSIS — N2581 Secondary hyperparathyroidism of renal origin: Secondary | ICD-10-CM | POA: Diagnosis not present

## 2019-01-17 DIAGNOSIS — N186 End stage renal disease: Secondary | ICD-10-CM | POA: Diagnosis not present

## 2019-01-17 DIAGNOSIS — Z992 Dependence on renal dialysis: Secondary | ICD-10-CM | POA: Diagnosis not present

## 2019-01-17 DIAGNOSIS — E1129 Type 2 diabetes mellitus with other diabetic kidney complication: Secondary | ICD-10-CM | POA: Diagnosis not present

## 2019-01-18 ENCOUNTER — Other Ambulatory Visit: Payer: Self-pay | Admitting: Family Medicine

## 2019-01-18 DIAGNOSIS — Z1231 Encounter for screening mammogram for malignant neoplasm of breast: Secondary | ICD-10-CM

## 2019-01-19 ENCOUNTER — Telehealth: Payer: Self-pay | Admitting: Family Medicine

## 2019-01-19 DIAGNOSIS — N186 End stage renal disease: Secondary | ICD-10-CM | POA: Diagnosis not present

## 2019-01-19 DIAGNOSIS — D631 Anemia in chronic kidney disease: Secondary | ICD-10-CM | POA: Diagnosis not present

## 2019-01-19 DIAGNOSIS — Z992 Dependence on renal dialysis: Secondary | ICD-10-CM | POA: Diagnosis not present

## 2019-01-19 DIAGNOSIS — N2581 Secondary hyperparathyroidism of renal origin: Secondary | ICD-10-CM | POA: Diagnosis not present

## 2019-01-19 DIAGNOSIS — E1129 Type 2 diabetes mellitus with other diabetic kidney complication: Secondary | ICD-10-CM | POA: Diagnosis not present

## 2019-01-19 NOTE — Telephone Encounter (Signed)
Per front desk pt is not having any other symptoms

## 2019-01-19 NOTE — Telephone Encounter (Signed)
Patient notified of PCP recommendations and is agreement and expresses an understanding.   Ok for PEC to Discuss results / PCP recommendations / Schedule patient.   

## 2019-01-19 NOTE — Telephone Encounter (Signed)
Since 99.5 is not a true fever, I would not get tested at this time.  If her temperature climbs above 100 or she develops other symptoms, it would be reasonable to get tested

## 2019-01-19 NOTE — Telephone Encounter (Signed)
Patient stated she is having temps of 98.6 98.3 and 99.5 and wants to know if she should go get tested for Covid this is her only symptom. Please advise.

## 2019-01-20 ENCOUNTER — Ambulatory Visit (INDEPENDENT_AMBULATORY_CARE_PROVIDER_SITE_OTHER): Payer: Medicare Other | Admitting: Podiatry

## 2019-01-20 DIAGNOSIS — M79672 Pain in left foot: Secondary | ICD-10-CM

## 2019-01-20 DIAGNOSIS — M79671 Pain in right foot: Secondary | ICD-10-CM

## 2019-01-21 DIAGNOSIS — D631 Anemia in chronic kidney disease: Secondary | ICD-10-CM | POA: Diagnosis not present

## 2019-01-21 DIAGNOSIS — N186 End stage renal disease: Secondary | ICD-10-CM | POA: Diagnosis not present

## 2019-01-21 DIAGNOSIS — E1129 Type 2 diabetes mellitus with other diabetic kidney complication: Secondary | ICD-10-CM | POA: Diagnosis not present

## 2019-01-21 DIAGNOSIS — N2581 Secondary hyperparathyroidism of renal origin: Secondary | ICD-10-CM | POA: Diagnosis not present

## 2019-01-21 DIAGNOSIS — Z992 Dependence on renal dialysis: Secondary | ICD-10-CM | POA: Diagnosis not present

## 2019-01-23 DIAGNOSIS — N186 End stage renal disease: Secondary | ICD-10-CM | POA: Diagnosis not present

## 2019-01-23 DIAGNOSIS — Z992 Dependence on renal dialysis: Secondary | ICD-10-CM | POA: Diagnosis not present

## 2019-01-23 DIAGNOSIS — I150 Renovascular hypertension: Secondary | ICD-10-CM | POA: Diagnosis not present

## 2019-01-24 DIAGNOSIS — N2581 Secondary hyperparathyroidism of renal origin: Secondary | ICD-10-CM | POA: Diagnosis not present

## 2019-01-24 DIAGNOSIS — E1129 Type 2 diabetes mellitus with other diabetic kidney complication: Secondary | ICD-10-CM | POA: Diagnosis not present

## 2019-01-24 DIAGNOSIS — N186 End stage renal disease: Secondary | ICD-10-CM | POA: Diagnosis not present

## 2019-01-24 DIAGNOSIS — Z992 Dependence on renal dialysis: Secondary | ICD-10-CM | POA: Diagnosis not present

## 2019-01-24 DIAGNOSIS — D509 Iron deficiency anemia, unspecified: Secondary | ICD-10-CM | POA: Diagnosis not present

## 2019-01-24 DIAGNOSIS — D631 Anemia in chronic kidney disease: Secondary | ICD-10-CM | POA: Diagnosis not present

## 2019-01-26 DIAGNOSIS — N186 End stage renal disease: Secondary | ICD-10-CM | POA: Diagnosis not present

## 2019-01-26 DIAGNOSIS — E1129 Type 2 diabetes mellitus with other diabetic kidney complication: Secondary | ICD-10-CM | POA: Diagnosis not present

## 2019-01-26 DIAGNOSIS — N2581 Secondary hyperparathyroidism of renal origin: Secondary | ICD-10-CM | POA: Diagnosis not present

## 2019-01-26 DIAGNOSIS — D509 Iron deficiency anemia, unspecified: Secondary | ICD-10-CM | POA: Diagnosis not present

## 2019-01-26 DIAGNOSIS — D631 Anemia in chronic kidney disease: Secondary | ICD-10-CM | POA: Diagnosis not present

## 2019-01-26 DIAGNOSIS — Z992 Dependence on renal dialysis: Secondary | ICD-10-CM | POA: Diagnosis not present

## 2019-01-27 ENCOUNTER — Ambulatory Visit: Payer: Medicare Other | Admitting: Podiatry

## 2019-01-28 DIAGNOSIS — N2581 Secondary hyperparathyroidism of renal origin: Secondary | ICD-10-CM | POA: Diagnosis not present

## 2019-01-28 DIAGNOSIS — D509 Iron deficiency anemia, unspecified: Secondary | ICD-10-CM | POA: Diagnosis not present

## 2019-01-28 DIAGNOSIS — N186 End stage renal disease: Secondary | ICD-10-CM | POA: Diagnosis not present

## 2019-01-28 DIAGNOSIS — D631 Anemia in chronic kidney disease: Secondary | ICD-10-CM | POA: Diagnosis not present

## 2019-01-28 DIAGNOSIS — E1129 Type 2 diabetes mellitus with other diabetic kidney complication: Secondary | ICD-10-CM | POA: Diagnosis not present

## 2019-01-28 DIAGNOSIS — Z992 Dependence on renal dialysis: Secondary | ICD-10-CM | POA: Diagnosis not present

## 2019-01-31 DIAGNOSIS — N2581 Secondary hyperparathyroidism of renal origin: Secondary | ICD-10-CM | POA: Diagnosis not present

## 2019-01-31 DIAGNOSIS — Z992 Dependence on renal dialysis: Secondary | ICD-10-CM | POA: Diagnosis not present

## 2019-01-31 DIAGNOSIS — D631 Anemia in chronic kidney disease: Secondary | ICD-10-CM | POA: Diagnosis not present

## 2019-01-31 DIAGNOSIS — E1129 Type 2 diabetes mellitus with other diabetic kidney complication: Secondary | ICD-10-CM | POA: Diagnosis not present

## 2019-01-31 DIAGNOSIS — D509 Iron deficiency anemia, unspecified: Secondary | ICD-10-CM | POA: Diagnosis not present

## 2019-01-31 DIAGNOSIS — N186 End stage renal disease: Secondary | ICD-10-CM | POA: Diagnosis not present

## 2019-02-01 ENCOUNTER — Telehealth: Payer: Self-pay

## 2019-02-01 DIAGNOSIS — R9089 Other abnormal findings on diagnostic imaging of central nervous system: Secondary | ICD-10-CM | POA: Diagnosis not present

## 2019-02-01 DIAGNOSIS — G0481 Other encephalitis and encephalomyelitis: Secondary | ICD-10-CM | POA: Diagnosis not present

## 2019-02-01 DIAGNOSIS — G253 Myoclonus: Secondary | ICD-10-CM | POA: Diagnosis not present

## 2019-02-01 DIAGNOSIS — G609 Hereditary and idiopathic neuropathy, unspecified: Secondary | ICD-10-CM | POA: Diagnosis not present

## 2019-02-01 NOTE — Telephone Encounter (Signed)
Patient wanted PCP to know that she was seeb by provider at [facility] who is wanting to cut back dosage on some of her medications. Office notes have been faxed to [place] and placed in providers box. FYI

## 2019-02-02 ENCOUNTER — Other Ambulatory Visit: Payer: Self-pay | Admitting: Family Medicine

## 2019-02-02 DIAGNOSIS — D509 Iron deficiency anemia, unspecified: Secondary | ICD-10-CM | POA: Diagnosis not present

## 2019-02-02 DIAGNOSIS — D631 Anemia in chronic kidney disease: Secondary | ICD-10-CM | POA: Diagnosis not present

## 2019-02-02 DIAGNOSIS — N2581 Secondary hyperparathyroidism of renal origin: Secondary | ICD-10-CM | POA: Diagnosis not present

## 2019-02-02 DIAGNOSIS — N186 End stage renal disease: Secondary | ICD-10-CM | POA: Diagnosis not present

## 2019-02-02 DIAGNOSIS — Z992 Dependence on renal dialysis: Secondary | ICD-10-CM | POA: Diagnosis not present

## 2019-02-02 DIAGNOSIS — E1129 Type 2 diabetes mellitus with other diabetic kidney complication: Secondary | ICD-10-CM | POA: Diagnosis not present

## 2019-02-02 NOTE — Telephone Encounter (Signed)
I reviewed notes and agree w/ recommendations made

## 2019-02-02 NOTE — Telephone Encounter (Signed)
Called and left a detailed message to advise pt of PCP agreement.

## 2019-02-04 DIAGNOSIS — E1129 Type 2 diabetes mellitus with other diabetic kidney complication: Secondary | ICD-10-CM | POA: Diagnosis not present

## 2019-02-04 DIAGNOSIS — Z992 Dependence on renal dialysis: Secondary | ICD-10-CM | POA: Diagnosis not present

## 2019-02-04 DIAGNOSIS — N2581 Secondary hyperparathyroidism of renal origin: Secondary | ICD-10-CM | POA: Diagnosis not present

## 2019-02-04 DIAGNOSIS — D631 Anemia in chronic kidney disease: Secondary | ICD-10-CM | POA: Diagnosis not present

## 2019-02-04 DIAGNOSIS — D509 Iron deficiency anemia, unspecified: Secondary | ICD-10-CM | POA: Diagnosis not present

## 2019-02-04 DIAGNOSIS — N186 End stage renal disease: Secondary | ICD-10-CM | POA: Diagnosis not present

## 2019-02-07 DIAGNOSIS — D509 Iron deficiency anemia, unspecified: Secondary | ICD-10-CM | POA: Diagnosis not present

## 2019-02-07 DIAGNOSIS — Z992 Dependence on renal dialysis: Secondary | ICD-10-CM | POA: Diagnosis not present

## 2019-02-07 DIAGNOSIS — E1129 Type 2 diabetes mellitus with other diabetic kidney complication: Secondary | ICD-10-CM | POA: Diagnosis not present

## 2019-02-07 DIAGNOSIS — N2581 Secondary hyperparathyroidism of renal origin: Secondary | ICD-10-CM | POA: Diagnosis not present

## 2019-02-07 DIAGNOSIS — D631 Anemia in chronic kidney disease: Secondary | ICD-10-CM | POA: Diagnosis not present

## 2019-02-07 DIAGNOSIS — N186 End stage renal disease: Secondary | ICD-10-CM | POA: Diagnosis not present

## 2019-02-09 DIAGNOSIS — D509 Iron deficiency anemia, unspecified: Secondary | ICD-10-CM | POA: Diagnosis not present

## 2019-02-09 DIAGNOSIS — E1129 Type 2 diabetes mellitus with other diabetic kidney complication: Secondary | ICD-10-CM | POA: Diagnosis not present

## 2019-02-09 DIAGNOSIS — N2581 Secondary hyperparathyroidism of renal origin: Secondary | ICD-10-CM | POA: Diagnosis not present

## 2019-02-09 DIAGNOSIS — N186 End stage renal disease: Secondary | ICD-10-CM | POA: Diagnosis not present

## 2019-02-09 DIAGNOSIS — D631 Anemia in chronic kidney disease: Secondary | ICD-10-CM | POA: Diagnosis not present

## 2019-02-09 DIAGNOSIS — Z992 Dependence on renal dialysis: Secondary | ICD-10-CM | POA: Diagnosis not present

## 2019-02-10 ENCOUNTER — Encounter: Payer: Self-pay | Admitting: Podiatry

## 2019-02-10 ENCOUNTER — Ambulatory Visit (INDEPENDENT_AMBULATORY_CARE_PROVIDER_SITE_OTHER): Payer: Medicare Other

## 2019-02-10 ENCOUNTER — Ambulatory Visit (INDEPENDENT_AMBULATORY_CARE_PROVIDER_SITE_OTHER): Payer: Medicare Other | Admitting: Podiatry

## 2019-02-10 ENCOUNTER — Other Ambulatory Visit: Payer: Self-pay | Admitting: Podiatry

## 2019-02-10 ENCOUNTER — Other Ambulatory Visit: Payer: Self-pay

## 2019-02-10 VITALS — BP 118/85

## 2019-02-10 DIAGNOSIS — M7752 Other enthesopathy of left foot: Secondary | ICD-10-CM

## 2019-02-10 DIAGNOSIS — M7751 Other enthesopathy of right foot: Secondary | ICD-10-CM

## 2019-02-10 DIAGNOSIS — G629 Polyneuropathy, unspecified: Secondary | ICD-10-CM | POA: Diagnosis not present

## 2019-02-10 DIAGNOSIS — M79672 Pain in left foot: Secondary | ICD-10-CM | POA: Diagnosis not present

## 2019-02-10 DIAGNOSIS — M79671 Pain in right foot: Secondary | ICD-10-CM

## 2019-02-10 DIAGNOSIS — M779 Enthesopathy, unspecified: Secondary | ICD-10-CM

## 2019-02-10 NOTE — Progress Notes (Signed)
Subjective:   Patient ID: [name] [name], female   DOB: 63 y.o.   MRN: [id number]   HPI Patient presents stating having pain and numbness in both feet and the numbness I am on gabapentin for which helps a little bit but I do get pain and some burning.  Patient does not smoke likes to be active and is on dialysis   Review of Systems  All other systems reviewed and are negative.       Objective:  Physical Exam Vitals signs and nursing note reviewed.  Constitutional:      Appearance: She is well-developed.  Pulmonary:     Effort: Pulmonary effort is normal.  Musculoskeletal: Normal range of motion.  Skin:    General: Skin is warm.  Neurological:     Mental Status: She is alert.     Vascular status intact with mild neurological issues with sharp dull vibratory reduced.  Patient is found to have discomfort in the forefoot bilateral around the fourth MPJ that appears to be localized to this area and does not appear to have any other pain pathology and does have numbness pathology which is been present for a while and is being treated by neurologist.  Good digital perfusion well oriented x3     Assessment:  Inflammatory capsulitis fourth MPJ bilateral with probable neuropathic idiopathic condition which may be related to dialysis and other metabolic issues     Plan:  H&P x-rays reviewed today I did sterile prep and injected periarticular around the fourth MPJ bilateral 2 mg Dexasone Kenalog 5 mg Xylocaine and advised this can be done periodically as needed  X-rays indicate no indications that there is an arthritic issue or any kind of other bone pathology

## 2019-02-11 DIAGNOSIS — D509 Iron deficiency anemia, unspecified: Secondary | ICD-10-CM | POA: Diagnosis not present

## 2019-02-11 DIAGNOSIS — Z992 Dependence on renal dialysis: Secondary | ICD-10-CM | POA: Diagnosis not present

## 2019-02-11 DIAGNOSIS — N186 End stage renal disease: Secondary | ICD-10-CM | POA: Diagnosis not present

## 2019-02-11 DIAGNOSIS — N2581 Secondary hyperparathyroidism of renal origin: Secondary | ICD-10-CM | POA: Diagnosis not present

## 2019-02-11 DIAGNOSIS — D631 Anemia in chronic kidney disease: Secondary | ICD-10-CM | POA: Diagnosis not present

## 2019-02-11 DIAGNOSIS — E1129 Type 2 diabetes mellitus with other diabetic kidney complication: Secondary | ICD-10-CM | POA: Diagnosis not present

## 2019-02-13 DIAGNOSIS — N2581 Secondary hyperparathyroidism of renal origin: Secondary | ICD-10-CM | POA: Diagnosis not present

## 2019-02-13 DIAGNOSIS — E1129 Type 2 diabetes mellitus with other diabetic kidney complication: Secondary | ICD-10-CM | POA: Diagnosis not present

## 2019-02-13 DIAGNOSIS — N186 End stage renal disease: Secondary | ICD-10-CM | POA: Diagnosis not present

## 2019-02-13 DIAGNOSIS — Z992 Dependence on renal dialysis: Secondary | ICD-10-CM | POA: Diagnosis not present

## 2019-02-13 DIAGNOSIS — D509 Iron deficiency anemia, unspecified: Secondary | ICD-10-CM | POA: Diagnosis not present

## 2019-02-13 DIAGNOSIS — D631 Anemia in chronic kidney disease: Secondary | ICD-10-CM | POA: Diagnosis not present

## 2019-02-15 DIAGNOSIS — D509 Iron deficiency anemia, unspecified: Secondary | ICD-10-CM | POA: Diagnosis not present

## 2019-02-15 DIAGNOSIS — N186 End stage renal disease: Secondary | ICD-10-CM | POA: Diagnosis not present

## 2019-02-15 DIAGNOSIS — N2581 Secondary hyperparathyroidism of renal origin: Secondary | ICD-10-CM | POA: Diagnosis not present

## 2019-02-15 DIAGNOSIS — D631 Anemia in chronic kidney disease: Secondary | ICD-10-CM | POA: Diagnosis not present

## 2019-02-15 DIAGNOSIS — E1129 Type 2 diabetes mellitus with other diabetic kidney complication: Secondary | ICD-10-CM | POA: Diagnosis not present

## 2019-02-15 DIAGNOSIS — Z992 Dependence on renal dialysis: Secondary | ICD-10-CM | POA: Diagnosis not present

## 2019-02-18 DIAGNOSIS — N2581 Secondary hyperparathyroidism of renal origin: Secondary | ICD-10-CM | POA: Diagnosis not present

## 2019-02-18 DIAGNOSIS — D631 Anemia in chronic kidney disease: Secondary | ICD-10-CM | POA: Diagnosis not present

## 2019-02-18 DIAGNOSIS — Z992 Dependence on renal dialysis: Secondary | ICD-10-CM | POA: Diagnosis not present

## 2019-02-18 DIAGNOSIS — D509 Iron deficiency anemia, unspecified: Secondary | ICD-10-CM | POA: Diagnosis not present

## 2019-02-18 DIAGNOSIS — E1129 Type 2 diabetes mellitus with other diabetic kidney complication: Secondary | ICD-10-CM | POA: Diagnosis not present

## 2019-02-18 DIAGNOSIS — N186 End stage renal disease: Secondary | ICD-10-CM | POA: Diagnosis not present

## 2019-02-21 DIAGNOSIS — E1129 Type 2 diabetes mellitus with other diabetic kidney complication: Secondary | ICD-10-CM | POA: Diagnosis not present

## 2019-02-21 DIAGNOSIS — N186 End stage renal disease: Secondary | ICD-10-CM | POA: Diagnosis not present

## 2019-02-21 DIAGNOSIS — D509 Iron deficiency anemia, unspecified: Secondary | ICD-10-CM | POA: Diagnosis not present

## 2019-02-21 DIAGNOSIS — D631 Anemia in chronic kidney disease: Secondary | ICD-10-CM | POA: Diagnosis not present

## 2019-02-21 DIAGNOSIS — N2581 Secondary hyperparathyroidism of renal origin: Secondary | ICD-10-CM | POA: Diagnosis not present

## 2019-02-21 DIAGNOSIS — Z992 Dependence on renal dialysis: Secondary | ICD-10-CM | POA: Diagnosis not present

## 2019-02-22 ENCOUNTER — Telehealth: Payer: Self-pay | Admitting: Family Medicine

## 2019-02-22 DIAGNOSIS — H04123 Dry eye syndrome of bilateral lacrimal glands: Secondary | ICD-10-CM | POA: Diagnosis not present

## 2019-02-22 DIAGNOSIS — G939 Disorder of brain, unspecified: Secondary | ICD-10-CM | POA: Diagnosis not present

## 2019-02-22 DIAGNOSIS — H3554 Dystrophies primarily involving the retinal pigment epithelium: Secondary | ICD-10-CM | POA: Diagnosis not present

## 2019-02-22 DIAGNOSIS — H35013 Changes in retinal vascular appearance, bilateral: Secondary | ICD-10-CM | POA: Diagnosis not present

## 2019-02-22 DIAGNOSIS — H35033 Hypertensive retinopathy, bilateral: Secondary | ICD-10-CM | POA: Diagnosis not present

## 2019-02-22 DIAGNOSIS — R9089 Other abnormal findings on diagnostic imaging of central nervous system: Secondary | ICD-10-CM | POA: Diagnosis not present

## 2019-02-22 DIAGNOSIS — H2513 Age-related nuclear cataract, bilateral: Secondary | ICD-10-CM | POA: Diagnosis not present

## 2019-02-22 DIAGNOSIS — H25013 Cortical age-related cataract, bilateral: Secondary | ICD-10-CM | POA: Diagnosis not present

## 2019-02-22 DIAGNOSIS — H524 Presbyopia: Secondary | ICD-10-CM | POA: Diagnosis not present

## 2019-02-22 DIAGNOSIS — G0481 Other encephalitis and encephalomyelitis: Secondary | ICD-10-CM | POA: Diagnosis not present

## 2019-02-22 DIAGNOSIS — R7309 Other abnormal glucose: Secondary | ICD-10-CM | POA: Diagnosis not present

## 2019-02-22 LAB — HM DIABETES EYE EXAM

## 2019-02-22 MED ORDER — ONETOUCH ULTRA VI STRP: ORAL_STRIP | 12 refills | Status: DC

## 2019-02-22 NOTE — Telephone Encounter (Signed)
Pt needs test strips for a One Touch ultra 2 Sent to [facility] on [address]

## 2019-02-22 NOTE — Telephone Encounter (Signed)
Medication filled to pharmacy as requested.   

## 2019-02-25 ENCOUNTER — Other Ambulatory Visit: Payer: Self-pay

## 2019-02-25 DIAGNOSIS — N186 End stage renal disease: Secondary | ICD-10-CM

## 2019-02-25 DIAGNOSIS — Z992 Dependence on renal dialysis: Secondary | ICD-10-CM

## 2019-02-28 ENCOUNTER — Telehealth (HOSPITAL_COMMUNITY): Payer: Self-pay

## 2019-02-28 NOTE — Telephone Encounter (Signed)

## 2019-02-28 NOTE — Progress Notes (Signed)
HISTORY AND PHYSICAL     CC:  dialysis access Requesting Provider:  Midge Minium, MD  HPI: This is a 63 y.o. female here for evaluation of her hemodialysis access.  She had a left 1st stage BVT on 06/16/17, 2nd stage on 9/37/34 and plication of left arm fistula on 11/23/2018 by Dr. Donzetta Matters.    She has hx of left BC AVF in early 2019.   She presents today for evaluation of her fistula as she is having low clearance rates on dialysis.  She denies any pain in her left hand.  She does have some tingling in the tips of her fingers on the left hand that she states started a couple of weeks ago.     If pt on Dialysis:   Dialysis days/center:  M/W/F @ [place] location  The pt is on a statin for cholesterol management.  The pt is diabetic.   The pt is on CCB for hypertension.   Tobacco hx:  remote The pt is not on a daily aspirin. Other AC:  none  Past Medical History:  Diagnosis Date  . Adenomatous colon polyp 04/1998  . Anemia   . Anxiety   . Arthritis   . Carotid artery disease ([place])    Carotid US 1/18: bilateral ICA 1-39, R thyroid lobe nodule (1.9x2.2x3cm); numerous L thyroid lobe nodules - repeat 1 year  . Chronic kidney disease    M/W/F [facility]  . Chronic renal failure    post transplant  . Depression   . Diabetes mellitus    diet controlled  . EBV infection    Per [facility]  . Encephalitis    NMDA per [facility]  . Esophagitis    Grade 1 Distal  . Focal seizure ([facility])    .  Last one 06/2016  . GERD (gastroesophageal reflux disease)   . Hemorrhoids   . History of blood transfusion   . History of kidney stones    Passed  . History of subdural hematoma   . Hx of cardiovascular stress test    Lexiscan Myoview (06/2013):  No ischemia, EF 66%; normal.  //  Myoview 12/17: EF 62, no ischemia or scar; Normal  . Hx of echocardiogram    a. Echocardiogram (06/2013):  Mod focal basal hypertrophy, EF 60-65%, normal wall motion, Gr 1 DD, mild AI, mildly dilated ascending aorta (41  mm), mild LAE.; b.  Echo 9/16: mod LVH, EF 60-65%, no RWMA, Gr 1 DD, trivial AI, mild dilated ascending aorta, mild LAE  . Hyperkalemia   . Hyperlipidemia   . Hypertension   . Hypomagnesemia   . Left jugular vein thrombosis    Partial resolved per [facility]  . Metabolic acidosis   . Pneumonia   . Right jugular vein thrombosis    resolved from [facility]  . Seizures ([place])     Past Surgical History:  Procedure Laterality Date  . A/V FISTULAGRAM Left 06/04/2017   Procedure: A/V FISTULAGRAM;  Surgeon: Conrad Silo, MD;  Location: [facility] [facility];  Service: Cardiovascular;  Laterality: Left;  . ABDOMINAL HYSTERECTOMY    . AV FISTULA PLACEMENT  07/04/2005   [place] AV fistula  . AV FISTULA PLACEMENT  08/27/2005  . AV FISTULA PLACEMENT Left 04/29/2017   Procedure: Creation of Left Arm ARTERIOVENOUS BRACHIOCEPHALIC FISTULA;  Surgeon: Elam Dutch, MD;  Location[facility];  Service: Vascular;  Laterality: Left;  . AV FISTULA PLACEMENT Left 06/16/2017   Procedure: BRACHIO_BASCILIC ARTERIOVENOUS (AV) FISTULA  CREATION;  Surgeon: Waynetta Sandy, MD;  Location: [place];  Service: Vascular;  Laterality: Left;  . AV FISTULA PLACEMENT W/ PTFE  08/27/2005  . BASCILIC VEIN TRANSPOSITION Left 06/16/2017   Procedure: BRACHIOCEPHALIC TRANSPOSITION  LEFT ARM;  Surgeon: Waynetta Sandy, MD;  Location: [place];  Service: Vascular;  Laterality: Left;  . BASCILIC VEIN TRANSPOSITION Left 09/01/2017   Procedure: BASILIC VEIN TRANSPOSITION SECOND STAGE;  Surgeon: Waynetta Sandy, MD;  Location: [place];  Service: Vascular;  Laterality: Left;  . BRAIN BIOPSY    . CESAREAN SECTION    . COLONOSCOPY W/ POLYPECTOMY    . DG AV DIALYSIS GRAFT DECLOT OR  07/24/2005   AV Gore-Tex graf  . DG AV DIALYSIS GRAFT DECLOT OR  Thrombosis right forearm, loop arteriovenous   Thrombosis right forearm, loop arteriovenous graft  . GASTROSTOMY TUBE PLACEMENT    . KIDNEY TRANSPLANT  2009   Both  . REMOVAL OF  GASTROSTOMY TUBE    . REVISON OF ARTERIOVENOUS FISTULA Left 11/23/2018   Procedure: REVISION PLICATION OF ARTERIOVENOUS FISTULA;  Surgeon: Waynetta Sandy, MD;  Location: [place];  Service: Vascular;  Laterality: Left;  . THROMBECTOMY / ARTERIOVENOUS GRAFT REVISION  10/12/2006  . THROMBECTOMY / ARTERIOVENOUS GRAFT REVISION  10/16/2006    Allergies  Allergen Reactions  . Lisinopril Shortness Of Breath, Swelling and Other (See Comments)    Throat irritation also. Patient takes losartan and tolerates fine.  [name] [Oxaprozin] Hives and Dermatitis    Current Outpatient Medications  Medication Sig Dispense Refill  . acetaminophen (TYLENOL) 650 MG CR tablet Take 650-1,300 mg by mouth every 8 (eight) hours as needed for pain.     [name] albuterol (VENTOLIN HFA) 108 (90 Base) MCG/ACT inhaler Inhale 2 puffs into the lungs every 4 (four) hours as needed for wheezing or shortness of breath. 1 Inhaler 3  . amLODipine (NORVASC) 10 MG tablet TAKE 1 TABLET BY MOUTH EVERY DAY 90 tablet 1  . atorvastatin (LIPITOR) 40 MG tablet TAKE 1 TABLET BY MOUTH EVERY DAY 90 tablet 1  . blood glucose meter kit and supplies KIT Dispense based on patient and insurance preference. Patient should test sugars twice daily as directed. Dx. E11.9 1 each 0  . cetirizine (ZYRTEC) 10 MG tablet TAKE 1 TABLET BY MOUTH EVERY DAY 30 tablet 11  . clonazePAM (KLONOPIN) 0.5 MG tablet TAKE 1 TABLET BY MOUTH EVERYDAY AT BEDTIME 30 tablet 3  . cloNIDine (CATAPRES) 0.1 MG tablet Take by mouth.    . ferric citrate (AURYXIA) 1 GM 210 MG(Fe) tablet Take 210 mg by mouth See admin instructions. Take 210 mg by mouth three times a day with meals and 210 mg two times a day with snacks    . FLOVENT DISKUS 250 MCG/BLIST AEPB Please specify directions, refills and quantity 60 each 3  . gabapentin (NEURONTIN) 300 MG capsule Take 1 capsule (300 mg total) by mouth 3 (three) times daily. 90 capsule 3  . glucose blood (ONETOUCH ULTRA) test strip Use as  instructed to test sugars twice daily. Dx. E11.9 100 each 12  . isosorbide dinitrate (ISORDIL) 20 MG tablet TAKE 1 TABLET BY MOUTH THREE TIMES A DAY 270 tablet 2  . lidocaine-prilocaine (EMLA) cream Apply 1 application topically as directed. Apply small amount to access site (AVF) 1 to 2 hrs before Dialysis. Cover with occlusive dressing.  3  . loratadine (CLARITIN) 10 MG tablet Take 1 tablet (10 mg total) by mouth daily. 30 tablet  11  . meclizine (ANTIVERT) 25 MG tablet Take 1 tablet (25 mg total) by mouth 3 (three) times daily as needed for dizziness. 90 tablet 3  . methylPREDNISolone (MEDROL DOSEPAK) 4 MG TBPK tablet Use as directed 21 tablet 0  . methylPREDNISolone sodium succinate (SOLU-MEDROL) 125 mg/2 mL injection Inject into the vein.    . multivitamin (RENA-VIT) TABS tablet Take 1 tablet by mouth every morning.   11  . nitroGLYCERIN (NITROSTAT) 0.4 MG SL tablet     . omeprazole (PRILOSEC) 40 MG capsule TAKE 1 CAPSULE BY MOUTH EVERY DAY 90 capsule 1  . oxyCODONE-acetaminophen (PERCOCET) 5-325 MG tablet Take 1 tablet by mouth every 6 (six) hours as needed. 8 tablet 0  . QUEtiapine (SEROQUEL) 100 MG tablet TAKE 1 TABLET BY MOUTH EVERYDAY AT BEDTIME 90 tablet 1  . traZODone (DESYREL) 50 MG tablet TAKE 0.5-1 TABLETS (25-50 MG TOTAL) BY MOUTH AT BEDTIME AS NEEDED FOR SLEEP. 90 tablet 1   Current Facility-Administered Medications  Medication Dose Route Frequency Provider Last Rate Last Dose  . methylPREDNISolone acetate (DEPO-MEDROL) injection 40 mg  40 mg Intra-articular Once Hilts, Michael, MD        Family History  Problem Relation Age of Onset  . Kidney disease Paternal Aunt   . Heart disease Mother   . Heart disease Father   . Colon cancer Neg Hx     Social History   Socioeconomic History  . Marital status: Widowed    Spouse name: Not on file  . Number of children: 2  . Years of education: 93  . Highest education level: Not on file  Occupational History  . Occupation: Museum/gallery conservator   Social Needs  . Financial resource strain: Not on file  . Food insecurity    Worry: Not on file    Inability: Not on file  . Transportation needs    Medical: Not on file    Non-medical: Not on file  Tobacco Use  . Smoking status: Former Research scientist (life sciences)  . Smokeless tobacco: Never Used  . Tobacco comment: smoked in teens  Substance and Sexual Activity  . Alcohol use: No  . Drug use: No  . Sexual activity: Never  Lifestyle  . Physical activity    Days per week: Not on file    Minutes per session: Not on file  . Stress: Not on file  Relationships  . Social Herbalist on phone: Not on file    Gets together: Not on file    Attends religious service: Not on file    Active member of club or organization: Not on file    Attends meetings of clubs or organizations: Not on file    Relationship status: Not on file  . Intimate partner violence    Fear of current or ex partner: Not on file    Emotionally abused: Not on file    Physically abused: Not on file    Forced sexual activity: Not on file  Other Topics Concern  . Not on file  Social History Narrative   Denies caffeine use      ROS: [x] Positive   [ ] Negative   [ ] All sytems reviewed and are negative  Cardiac: [] chest pain/pressure [] SOB [] DOE  Vascular: [] pain in legs while walking [] pain in feet when lying flat [] hx of DVT [] swelling in legs  Pulmonary: [] asthma [] wheezing  Neurologic: [] weakness in [] arms []  legs [] numbness in [] arms [] legs []difficulty speaking or slurred speech [] temporary loss of vision in one eye [] dizziness  Hematologic: [] bleeding problems  GI [x] GERD  GU: [x] CKD/renal failure  [x] HD---[x] M/W/F [] T/T/S [] burning with urination [] blood in urine  Psychiatric: [] hx of major depression  Integumentary: [] rashes [] ulcers  Constitutional: [] fever [] chills  PHYSICAL EXAMINATION:  Today's Vitals   03/01/19 1524  BP: 137/85   Pulse: 68  Temp: (!) 97 F (36.1 C)  TempSrc: Temporal  SpO2: 91%  Weight: 192 lb 4.8 oz (87.2 kg)  Height: 5' 5" (1.651 m)   Body mass index is 32 kg/m.    General:  WDWN female in NAD Gait: Not observed HENT: WNL Pulmonary: normal non-labored breathing , without Rales, rhonchi,  wheezing Cardiac: regular, without  Murmurs without carotid bruits Abdomen: soft, NT, no masses Skin: without rashes, without ulcers  Vascular Exam/Pulses:   Right Left  Radial 2+ (normal) 2+ (normal)  Ulnar Unable to palpate  Unable to palpate    Extremities:  without ischemic changes, without Gangrene, without cellulitis; without open wounds; there is a thrill within the fistula.  It is somewhat aneurysmal mid arm. There are no ulcers on the fistula.  Musculoskeletal: no muscle wasting or atrophy  Neurologic: A&O X 3; Moving all extremities equally;  Speech is fluent/normal  Non-Invasive Vascular Imaging:   Dialysis duplex 03/01/2019: +------------+----------+-------------+----------+-----------------------------+ OUTFLOW VEINPSV (cm/s)Diameter (cm)Depth (cm)          Describe            +------------+----------+-------------+----------+-----------------------------+ Prox UA         80        0.74        0.28                                 +------------+----------+-------------+----------+-----------------------------+ Mid UA      185 / 501     0.55        0.30   slight narrowing, 1.01 cm in                                                          length             +------------+----------+-------------+----------+-----------------------------+ Dist UA         56        1.88        0.19                                 +------------+----------+-------------+----------+-----------------------------+ AC Fossa       285        0.48        0.37         proximal segment        +------------+----------+-------------+----------+-----------------------------+ Dst  Ua         332        0.49        0.38            mid segment          +------------+----------+-------------+----------+-----------------------------+   ASSESSMENT/PLAN: 63 y.o. female  with hx of  left 1st stage BVT on 06/16/17, 2nd stage on 6/38/93 and plication of left arm fistula on 11/23/2018 by Dr. Donzetta Matters.  here for evaluation of her hemodialysis access  -she was referred for evaluation of her fistula for low clearance.  Her duplex today shows some narrowing in the mid upper arm.  Will plan for fistulogram to evaluate fistula and possible intervention.  Discussed with pt and she is in agreement with proceeding.  She dialyzes on M/W/F.    Leontine Locket, PA-C [facility] [phone]  Clinic MD:   Early

## 2019-03-01 ENCOUNTER — Ambulatory Visit (HOSPITAL_COMMUNITY)
Admission: RE | Admit: 2019-03-01 | Discharge: 2019-03-01 | Disposition: A | Discharge status: Home / Self Care | Payer: Medicare Other | Source: Ambulatory Visit | Attending: Family | Admitting: Family

## 2019-03-01 ENCOUNTER — Ambulatory Visit (INDEPENDENT_AMBULATORY_CARE_PROVIDER_SITE_OTHER): Payer: Medicare Other | Admitting: Physician Assistant

## 2019-03-01 ENCOUNTER — Other Ambulatory Visit: Payer: Self-pay

## 2019-03-01 ENCOUNTER — Encounter: Payer: Self-pay | Admitting: *Deleted

## 2019-03-01 ENCOUNTER — Other Ambulatory Visit: Payer: Self-pay | Admitting: *Deleted

## 2019-03-01 VITALS — BP 137/85 | HR 68 | Temp 97.0°F | Ht 65.0 in | Wt 192.3 lb

## 2019-03-01 DIAGNOSIS — Z992 Dependence on renal dialysis: Secondary | ICD-10-CM | POA: Diagnosis not present

## 2019-03-01 DIAGNOSIS — N186 End stage renal disease: Secondary | ICD-10-CM

## 2019-03-02 ENCOUNTER — Other Ambulatory Visit: Payer: Self-pay

## 2019-03-02 DIAGNOSIS — Z20822 Contact with and (suspected) exposure to covid-19: Secondary | ICD-10-CM

## 2019-03-06 LAB — NOVEL CORONAVIRUS, NAA: SARS-CoV-2, NAA: NOT DETECTED

## 2019-03-08 ENCOUNTER — Other Ambulatory Visit: Payer: Self-pay | Admitting: Family Medicine

## 2019-03-10 ENCOUNTER — Ambulatory Visit: Payer: Medicare Other

## 2019-03-11 ENCOUNTER — Encounter: Payer: Self-pay | Admitting: General Practice

## 2019-03-21 ENCOUNTER — Other Ambulatory Visit (HOSPITAL_COMMUNITY)
Admission: RE | Admit: 2019-03-21 | Discharge: 2019-03-21 | Disposition: A | Discharge status: Home / Self Care | Payer: Medicare Other | Source: Ambulatory Visit | Attending: Vascular Surgery | Admitting: Vascular Surgery

## 2019-03-21 DIAGNOSIS — Z20828 Contact with and (suspected) exposure to other viral communicable diseases: Secondary | ICD-10-CM | POA: Insufficient documentation

## 2019-03-21 DIAGNOSIS — Z01812 Encounter for preprocedural laboratory examination: Secondary | ICD-10-CM | POA: Diagnosis present

## 2019-03-22 LAB — NOVEL CORONAVIRUS, NAA (HOSP ORDER, SEND-OUT TO REF LAB; TAT 18-24 HRS): SARS-CoV-2, NAA: NOT DETECTED

## 2019-03-24 ENCOUNTER — Ambulatory Visit (HOSPITAL_COMMUNITY)
Admission: RE | Admit: 2019-03-24 | Discharge: 2019-03-24 | Disposition: A | Discharge status: Home / Self Care | Payer: Medicare Other | Attending: Vascular Surgery | Admitting: Vascular Surgery

## 2019-03-24 ENCOUNTER — Encounter (HOSPITAL_COMMUNITY)
Admission: RE | Disposition: A | Discharge status: Home / Self Care | Payer: Self-pay | Source: Home / Self Care | Attending: Vascular Surgery

## 2019-03-24 ENCOUNTER — Other Ambulatory Visit: Payer: Self-pay

## 2019-03-24 DIAGNOSIS — R569 Unspecified convulsions: Secondary | ICD-10-CM | POA: Diagnosis not present

## 2019-03-24 DIAGNOSIS — Z8249 Family history of ischemic heart disease and other diseases of the circulatory system: Secondary | ICD-10-CM | POA: Insufficient documentation

## 2019-03-24 DIAGNOSIS — E1122 Type 2 diabetes mellitus with diabetic chronic kidney disease: Secondary | ICD-10-CM | POA: Diagnosis not present

## 2019-03-24 DIAGNOSIS — Z841 Family history of disorders of kidney and ureter: Secondary | ICD-10-CM | POA: Insufficient documentation

## 2019-03-24 DIAGNOSIS — Z86718 Personal history of other venous thrombosis and embolism: Secondary | ICD-10-CM | POA: Insufficient documentation

## 2019-03-24 DIAGNOSIS — K219 Gastro-esophageal reflux disease without esophagitis: Secondary | ICD-10-CM | POA: Insufficient documentation

## 2019-03-24 DIAGNOSIS — N189 Chronic kidney disease, unspecified: Secondary | ICD-10-CM | POA: Insufficient documentation

## 2019-03-24 DIAGNOSIS — Z888 Allergy status to other drugs, medicaments and biological substances status: Secondary | ICD-10-CM | POA: Diagnosis not present

## 2019-03-24 DIAGNOSIS — M1990 Unspecified osteoarthritis, unspecified site: Secondary | ICD-10-CM | POA: Diagnosis not present

## 2019-03-24 DIAGNOSIS — Y832 Surgical operation with anastomosis, bypass or graft as the cause of abnormal reaction of the patient, or of later complication, without mention of misadventure at the time of the procedure: Secondary | ICD-10-CM | POA: Insufficient documentation

## 2019-03-24 DIAGNOSIS — Z940 Kidney transplant status: Secondary | ICD-10-CM | POA: Diagnosis not present

## 2019-03-24 DIAGNOSIS — E785 Hyperlipidemia, unspecified: Secondary | ICD-10-CM | POA: Insufficient documentation

## 2019-03-24 DIAGNOSIS — Z992 Dependence on renal dialysis: Secondary | ICD-10-CM | POA: Diagnosis not present

## 2019-03-24 DIAGNOSIS — Z7952 Long term (current) use of systemic steroids: Secondary | ICD-10-CM | POA: Diagnosis not present

## 2019-03-24 DIAGNOSIS — T82898A Other specified complication of vascular prosthetic devices, implants and grafts, initial encounter: Secondary | ICD-10-CM | POA: Diagnosis not present

## 2019-03-24 DIAGNOSIS — N990 Postprocedural (acute) (chronic) kidney failure: Secondary | ICD-10-CM | POA: Insufficient documentation

## 2019-03-24 DIAGNOSIS — Z79899 Other long term (current) drug therapy: Secondary | ICD-10-CM | POA: Diagnosis not present

## 2019-03-24 DIAGNOSIS — T82858A Stenosis of vascular prosthetic devices, implants and grafts, initial encounter: Secondary | ICD-10-CM | POA: Insufficient documentation

## 2019-03-24 DIAGNOSIS — I129 Hypertensive chronic kidney disease with stage 1 through stage 4 chronic kidney disease, or unspecified chronic kidney disease: Secondary | ICD-10-CM | POA: Diagnosis not present

## 2019-03-24 DIAGNOSIS — Z7951 Long term (current) use of inhaled steroids: Secondary | ICD-10-CM | POA: Insufficient documentation

## 2019-03-24 DIAGNOSIS — Z87891 Personal history of nicotine dependence: Secondary | ICD-10-CM | POA: Diagnosis not present

## 2019-03-24 HISTORY — PX: PERIPHERAL VASCULAR BALLOON ANGIOPLASTY: CATH118281

## 2019-03-24 HISTORY — PX: A/V FISTULAGRAM: CATH118298

## 2019-03-24 LAB — POCT I-STAT, CHEM 8
BUN: 25 mg/dL — ABNORMAL HIGH (ref 8–23)
Calcium, Ion: 1.1 mmol/L — ABNORMAL LOW (ref 1.15–1.40)
Chloride: 98 mmol/L (ref 98–111)
Creatinine, Ser: 9.2 mg/dL — ABNORMAL HIGH (ref 0.44–1.00)
Glucose, Bld: 123 mg/dL — ABNORMAL HIGH (ref 70–99)
HCT: 36 % (ref 36.0–46.0)
Hemoglobin: 12.2 g/dL (ref 12.0–15.0)
Potassium: 3.5 mmol/L (ref 3.5–5.1)
Sodium: 137 mmol/L (ref 135–145)
TCO2: 28 mmol/L (ref 22–32)

## 2019-03-24 SURGERY — A/V FISTULAGRAM
Anesthesia: LOCAL

## 2019-03-24 MED ORDER — HEPARIN (PORCINE) IN NACL 1000-0.9 UT/500ML-% IV SOLN
INTRAVENOUS | Status: AC
  Filled 2019-03-24: qty 500

## 2019-03-24 MED ORDER — LIDOCAINE HCL (PF) 1 % IJ SOLN
INTRAMUSCULAR | Status: DC | PRN
  Administered 2019-03-24: 6 mL

## 2019-03-24 MED ORDER — HEPARIN SODIUM (PORCINE) 1000 UNIT/ML IJ SOLN
INTRAMUSCULAR | Status: DC | PRN
  Administered 2019-03-24: 3000 [IU] via INTRAVENOUS

## 2019-03-24 MED ORDER — IODIXANOL 320 MG/ML IV SOLN
INTRAVENOUS | Status: DC | PRN
  Administered 2019-03-24: 43 mL

## 2019-03-24 MED ORDER — LIDOCAINE HCL (PF) 1 % IJ SOLN
INTRAMUSCULAR | Status: AC
  Filled 2019-03-24: qty 30

## 2019-03-24 MED ORDER — SODIUM CHLORIDE 0.9% FLUSH: 3.0000 mL | INTRAVENOUS | Status: DC | PRN

## 2019-03-24 MED ORDER — HEPARIN (PORCINE) IN NACL 1000-0.9 UT/500ML-% IV SOLN
INTRAVENOUS | Status: DC | PRN
  Administered 2019-03-24: 500 mL

## 2019-03-24 MED ORDER — HEPARIN SODIUM (PORCINE) 1000 UNIT/ML IJ SOLN
INTRAMUSCULAR | Status: AC
  Filled 2019-03-24: qty 1

## 2019-03-24 SURGICAL SUPPLY — 19 items
BALLN LUTONIX AV 10X60X75 (BALLOONS) ×3
BALLN MUSTANG 7.0X40 75 (BALLOONS) ×3
BALLN MUSTANG 8.0X40 75 (BALLOONS) ×3
BALLOON LUTONIX AV 10X60X75 (BALLOONS) IMPLANT
BALLOON MUSTANG 7.0X40 75 (BALLOONS) IMPLANT
BALLOON MUSTANG 8.0X40 75 (BALLOONS) IMPLANT
COVER DOME SNAP 22 D (MISCELLANEOUS) ×3 IMPLANT
KIT ENCORE 26 ADVANTAGE (KITS) ×1 IMPLANT
KIT MICROPUNCTURE NIT STIFF (SHEATH) ×1 IMPLANT
PROTECTION STATION PRESSURIZED (MISCELLANEOUS) ×3
SHEATH PINNACLE R/O II 5F 6CM (SHEATH) ×1 IMPLANT
SHEATH PINNACLE R/O II 6F 4CM (SHEATH) ×1 IMPLANT
SHEATH PINNACLE R/O II 7F 4CM (SHEATH) ×1 IMPLANT
SHEATH PROBE COVER 6X72 (BAG) ×4 IMPLANT
STATION PROTECTION PRESSURIZED (MISCELLANEOUS) ×2 IMPLANT
STOPCOCK MORSE 400PSI 3WAY (MISCELLANEOUS) ×3 IMPLANT
TRAY PV CATH (CUSTOM PROCEDURE TRAY) ×3 IMPLANT
TUBING CIL FLEX 10 FLL-RA (TUBING) ×3 IMPLANT
WIRE BENTSON .035X145CM (WIRE) ×1 IMPLANT

## 2019-03-24 NOTE — Op Note (Signed)
OPERATIVE NOTE   PROCEDURE: 1. left brachiobasilic arteriovenous fistula cannulation under ultrasound guidance 2. left arm fistulogram including central venogram 3. left basilic vein angioplasty (8 mm x 40 mm Mustang and 10 mm x 60 mm drug coated Lutonix for proximal upper arm stenosis and 7 mm x 40 mm Mustang for distal upper arm stenosis)  PRE-OPERATIVE DIAGNOSIS: Malfunctioning left arteriovenous fistula  POST-OPERATIVE DIAGNOSIS: same as above   SURGEON: [name], MD  ANESTHESIA: local  ESTIMATED BLOOD LOSS: 5 cc  FINDING(S): 1. Left arm brachiobasilic fistula.  No evidence of central stenosis.  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  There was a high-grade greater than 90% stenosis in the proximal upper arm that was angioplastied with a 8 mm Mustang and 10 mm Lutonix with less than 30% residual stenosis.  We then accessed the fistula retrograde and treated a second approximate 50 to 60% stenosis in the distal upper arm with a 7 mm Mustang with no residual stenosis here.  She has an excellent thrill now.  SPECIMEN(S):  None  CONTRAST: 43 cc  INDICATIONS: [name] [name] is a 63 y.o. female who presents with malfunctioning left arteriovenous fistula.  The patient is scheduled for left arm fistulogram.  The patient is aware the risks include but are not limited to: bleeding, infection, thrombosis of the cannulated access, and possible anaphylactic reaction to the contrast.  The patient is aware of the risks of the procedure and elects to proceed forward.  DESCRIPTION: After full informed written consent was obtained, the patient was brought back to the angiography suite and placed supine upon the angiography table.  The patient was connected to monitoring equipment.  The left arm was prepped and draped in the standard fashion for a left arm fistulogram.  Under ultrasound guidance, the fistula was evaluated, it was patent, an image was saved.  It  was accessed under ultrasound guidance and cannulated with a micropuncture needle.  The microwire was advanced into the fistula and the needle was exchanged for the a microsheath, which was lodged 2 cm into the access.  The wire was removed and the sheath was connected to the IV extension tubing.  Hand injections were completed to image the access from the antecubitum up to the level of axilla.  The central venous structures were also imaged by hand injections.  Ultimately there were 2 lesions that were identified as noted above.  I then used a Bentson wire to cross the proximal upper arm lesion.  I exchanged for a 6 French short sheath.  Patient was given 3000 units IV heparin.  I initially angioplastied the proximal lesion with an 8 mm x 40 mm Mustang to nominal pressure for 2 minutes.  There was still more than 30% residual stenosis.  I then exchanged for a larger 7 French sheath and treated this with a 10 mm x 60 mm drug-coated Lutonix.  There was less than 30% residual stenosis at this time.  4-0 Monocryl pursestring was tied around this and the sheath was removed.  I then had accessed the fistula retrograde in order to get to the more distal lesion in the arm.  I then used ultrasound to evaluate the fistula in the upper arm where it was still patent.  I then accessed this retrograde with a micro access needle placed a microwire and then exchanged for a Bentson wire.  I then placed a short 5 [place] sheath.  I used a  7 mm Mustang to treat this proximal lesion.  There was no residual stenosis.  4-0 Monocryl pursestring was tied down and the sheath was removed.  She had excellent thrill.  COMPLICATIONS: None  CONDITION: Stable  [name], MD [facility]: [phone]   03/24/2019 8:21 AM

## 2019-03-24 NOTE — Discharge Instructions (Signed)

## 2019-03-24 NOTE — H&P (Signed)
History and Physical Interval Note:  03/24/2019 7:37 AM  [name] [name]  has presented today for surgery, with the diagnosis of complication of fistula.  The various methods of treatment have been discussed with the patient and family. After consideration of risks, benefits and other options for treatment, the patient has consented to  Procedure(s): A/V FISTULAGRAM - left arm (N/A) as a surgical intervention.  The patient's history has been reviewed, patient examined, no change in status, stable for surgery.  I have reviewed the patient's chart and labs.  Questions were answered to the patient's satisfaction.    Left arm fistulogram.  [name] [name]  HISTORY AND PHYSICAL  CC: dialysis access  Requesting Provider: Midge Minium, MD  HPI: This is a 63 y.o. female here for evaluation of her hemodialysis access. She had a left 1st stage BVT on 06/16/17, 2nd stage on 9/45/03 and plication of left arm fistula on 11/23/2018 by Dr. Donzetta [name].  She has hx of left BC AVF in early 2019.  She presents today for evaluation of her fistula as she is having low clearance rates on dialysis. She denies any pain in her left hand. She does have some tingling in the tips of her fingers on the left hand that she states started a couple of weeks ago.  If pt on Dialysis:  Dialysis days/center: M/W/F @ [place] location  The pt is on a statin for cholesterol management.  The pt is diabetic.  The pt is on CCB for hypertension.  Tobacco hx: remote  The pt is not on a daily aspirin.  Other AC: none      Past Medical History:  Diagnosis Date  . Adenomatous colon polyp 04/1998  . Anemia   . Anxiety   . Arthritis   . Carotid artery disease ([place])    Carotid US 1/18: bilateral ICA 1-39, R thyroid lobe nodule (1.9x2.2x3cm); numerous L thyroid lobe nodules - repeat 1 year  . Chronic kidney disease    M/W/F [facility]  . Chronic renal failure    post transplant  . Depression   . Diabetes mellitus     diet controlled  . EBV infection    Per [facility]  . Encephalitis    NMDA per [facility]  . Esophagitis    Grade 1 Distal  . Focal seizure (H[facility])    . Last one 06/2016  . GERD (gastroesophageal reflux disease)   . Hemorrhoids   . History of blood transfusion   . History of kidney stones    Passed  . History of subdural hematoma   . Hx of cardiovascular stress test    Lexiscan Myoview (06/2013): No ischemia, EF 66%; normal. // Myoview 12/17: EF 62, no ischemia or scar; Normal  . Hx of echocardiogram    a. Echocardiogram (06/2013): Mod focal basal hypertrophy, EF 60-65%, normal wall motion, Gr 1 DD, mild AI, mildly dilated ascending aorta (41 mm), mild LAE.; b. Echo 9/16: mod LVH, EF 60-65%, no RWMA, Gr 1 DD, trivial AI, mild dilated ascending aorta, mild LAE  . Hyperkalemia   . Hyperlipidemia   . Hypertension   . Hypomagnesemia   . Left jugular vein thrombosis    Partial resolved per [facility]  . Metabolic acidosis   . Pneumonia   . Right jugular vein thrombosis    resolved from [facility]  . Seizures ([place])         Past Surgical History:  Procedure Laterality Date  . A/V FISTULAGRAM Left  06/04/2017   Procedure: A/V FISTULAGRAM; Surgeon: [name] Ualapue, MD; Location: [facility]; Service: Cardiovascular; Laterality: Left;  . ABDOMINAL HYSTERECTOMY    . AV FISTULA PLACEMENT  07/04/2005   [place] AV fistula  . AV FISTULA PLACEMENT  08/27/2005  . AV FISTULA PLACEMENT Left 04/29/2017   Procedure: Creation of Left Arm ARTERIOVENOUS BRACHIOCEPHALIC FISTULA; Surgeon: [name] Dutch, MD; Location: [place]; Service: Vascular; Laterality: Left;  . AV FISTULA PLACEMENT Left 06/16/2017   Procedure: BRACHIO_BASCILIC ARTERIOVENOUS (AV) FISTULA CREATION; Surgeon: [name] Sandy, MD; Location: [facility]; Service: Vascular; Laterality: Left;  . AV FISTULA PLACEMENT W/ PTFE  08/27/2005  . BASCILIC VEIN TRANSPOSITION Left 06/16/2017   Procedure: BRACHIOCEPHALIC TRANSPOSITION LEFT ARM; Surgeon: [name] Sandy, MD; Location: [place]; Service: Vascular; Laterality: Left;  . BASCILIC VEIN TRANSPOSITION Left 09/01/2017   Procedure: BASILIC VEIN TRANSPOSITION SECOND STAGE; Surgeon: [name] Sandy, MD; Location: [place]; Service: Vascular; Laterality: Left;  . BRAIN BIOPSY    . CESAREAN SECTION    . COLONOSCOPY W/ POLYPECTOMY    . DG AV DIALYSIS GRAFT DECLOT OR  07/24/2005   AV Gore-Tex graf  . DG AV DIALYSIS GRAFT DECLOT OR  Thrombosis right forearm, loop arteriovenous   Thrombosis right forearm, loop arteriovenous graft  . GASTROSTOMY TUBE PLACEMENT    . KIDNEY TRANSPLANT  2009   Both  . REMOVAL OF GASTROSTOMY TUBE    . REVISON OF ARTERIOVENOUS FISTULA Left 11/23/2018   Procedure: REVISION PLICATION OF ARTERIOVENOUS FISTULA; Surgeon: [name] Sandy, MD; Location: [place]; Service: Vascular; Laterality: Left;  . THROMBECTOMY / ARTERIOVENOUS GRAFT REVISION  10/12/2006  . THROMBECTOMY / ARTERIOVENOUS GRAFT REVISION  10/16/2006        Allergies  Allergen Reactions  . Lisinopril Shortness Of Breath, Swelling and Other (See Comments)    Throat irritation also. Patient takes losartan and tolerates fine.  [name] [name] [Oxaprozin] Hives and Dermatitis         Current Outpatient Medications  Medication Sig Dispense Refill  . acetaminophen (TYLENOL) 650 MG CR tablet Take 650-1,300 mg by mouth every 8 (eight) hours as needed for pain.     [name] albuterol (VENTOLIN HFA) 108 (90 Base) MCG/ACT inhaler Inhale 2 puffs into the lungs every 4 (four) hours as needed for wheezing or shortness of breath. 1 Inhaler 3  . amLODipine (NORVASC) 10 MG tablet TAKE 1 TABLET BY MOUTH EVERY DAY 90 tablet 1  . atorvastatin (LIPITOR) 40 MG tablet TAKE 1 TABLET BY MOUTH EVERY DAY 90 tablet 1  . blood glucose meter kit and supplies KIT Dispense based on patient and insurance preference. Patient should test sugars twice daily as directed. Dx. E11.9 1 each 0  . cetirizine (ZYRTEC) 10 MG tablet TAKE 1 TABLET BY  MOUTH EVERY DAY 30 tablet 11  . clonazePAM (KLONOPIN) 0.5 MG tablet TAKE 1 TABLET BY MOUTH EVERYDAY AT BEDTIME 30 tablet 3  . cloNIDine (CATAPRES) 0.1 MG tablet Take by mouth.    . ferric citrate (AURYXIA) 1 GM 210 MG(Fe) tablet Take 210 mg by mouth See admin instructions. Take 210 mg by mouth three times a day with meals and 210 mg two times a day with snacks    . FLOVENT DISKUS 250 MCG/BLIST AEPB Please specify directions, refills and quantity 60 each 3  . gabapentin (NEURONTIN) 300 MG capsule Take 1 capsule (300 mg total) by mouth 3 (three) times daily. 90 capsule 3  . glucose blood (ONETOUCH ULTRA) test strip Use  as instructed to test sugars twice daily. Dx. E11.9 100 each 12  . isosorbide dinitrate (ISORDIL) 20 MG tablet TAKE 1 TABLET BY MOUTH THREE TIMES A DAY 270 tablet 2  . lidocaine-prilocaine (EMLA) cream Apply 1 application topically as directed. Apply small amount to access site (AVF) 1 to 2 hrs before Dialysis. Cover with occlusive dressing.  3  . loratadine (CLARITIN) 10 MG tablet Take 1 tablet (10 mg total) by mouth daily. 30 tablet 11  . meclizine (ANTIVERT) 25 MG tablet Take 1 tablet (25 mg total) by mouth 3 (three) times daily as needed for dizziness. 90 tablet 3  . methylPREDNISolone (MEDROL DOSEPAK) 4 MG TBPK tablet Use as directed 21 tablet 0  . methylPREDNISolone sodium succinate (SOLU-MEDROL) 125 mg/2 mL injection Inject into the vein.    . multivitamin (RENA-VIT) TABS tablet Take 1 tablet by mouth every morning.   11  . nitroGLYCERIN (NITROSTAT) 0.4 MG SL tablet     . omeprazole (PRILOSEC) 40 MG capsule TAKE 1 CAPSULE BY MOUTH EVERY DAY 90 capsule 1  . oxyCODONE-acetaminophen (PERCOCET) 5-325 MG tablet Take 1 tablet by mouth every 6 (six) hours as needed. 8 tablet 0  . QUEtiapine (SEROQUEL) 100 MG tablet TAKE 1 TABLET BY MOUTH EVERYDAY AT BEDTIME 90 tablet 1  . traZODone (DESYREL) 50 MG tablet TAKE 0.5-1 TABLETS (25-50 MG TOTAL) BY MOUTH AT BEDTIME AS NEEDED FOR SLEEP. 90  tablet 1            Current Facility-Administered Medications  Medication Dose Route Frequency Provider Last Rate Last Dose  . methylPREDNISolone acetate (DEPO-MEDROL) injection 40 mg 40 mg Intra-articular Once Hilts, Michael, MD          Family History  Problem Relation Age of Onset  . Kidney disease Paternal Aunt   . Heart disease Mother   . Heart disease Father   . Colon cancer Neg Hx    Social History        Socioeconomic History  . Marital status: Widowed    Spouse name: Not on file  . Number of children: 2  . Years of education: 20  . Highest education level: Not on file  Occupational History  . Occupation: Investment banker, operational   Social Needs  . Financial resource strain: Not on file  . Food insecurity    Worry: Not on file    Inability: Not on file  . Transportation needs    Medical: Not on file    Non-medical: Not on file  Tobacco Use  . Smoking status: Former Research scientist (life sciences)  . Smokeless tobacco: Never Used  . Tobacco comment: smoked in teens  Substance and Sexual Activity  . Alcohol use: No  . Drug use: No  . Sexual activity: Never  Lifestyle  . Physical activity    Days per week: Not on file    Minutes per session: Not on file  . Stress: Not on file  Relationships  . Social Herbalist on phone: Not on file    Gets together: Not on file    Attends religious service: Not on file    Active member of club or organization: Not on file    Attends meetings of clubs or organizations: Not on file    Relationship status: Not on file  . Intimate partner violence    Fear of current or ex partner: Not on file    Emotionally abused: Not on file    Physically abused: Not on file  Forced sexual activity: Not on file  Other Topics Concern  . Not on file  Social History Narrative   Denies caffeine use    ROS: '[x]'  Positive '[ ]'  Negative '[ ]'  All sytems reviewed and are negative  Cardiac:  '[]'  chest pain/pressure  '[]'  SOB  '[]'  DOE  Vascular:  '[]'  pain in legs while  walking  '[]'  pain in feet when lying flat  '[]'  hx of DVT  '[]'  swelling in legs  Pulmonary:  '[]'  asthma  '[]'  wheezing  Neurologic:  '[]'  weakness in '[]'  arms '[]'  legs  '[]'  numbness in '[]'  arms '[]'  legs  '[]' difficulty speaking or slurred speech  '[]'  temporary loss of vision in one eye  '[]'  dizziness  Hematologic:  '[]'  bleeding problems  GI  '[x]'  GERD  GU:  '[x]'  CKD/renal failure '[x]'  HD---'[x]'  M/W/F '[]'  T/T/S  '[]'  burning with urination  '[]'  blood in urine  Psychiatric:  '[]'  hx of major depression  Integumentary:  '[]'  rashes '[]'  ulcers  Constitutional:  '[]'  fever '[]'  chills  PHYSICAL EXAMINATION:     Today's Vitals   03/01/19 1524  BP: 137/85  Pulse: 68  Temp: (!) 97 F (36.1 C)  TempSrc: Temporal  SpO2: 91%  Weight: 192 lb 4.8 oz (87.2 kg)  Height: '5\' 5"'  (1.651 m)   Body mass index is 32 kg/m.  General: WDWN female in NAD  Gait: Not observed  HENT: WNL  Pulmonary: normal non-labored breathing , without Rales, rhonchi, wheezing  Cardiac: regular, without Murmurs without carotid bruits  Abdomen: soft, NT, no masses  Skin: without rashes, without ulcers  Vascular Exam/Pulses:   Right Left  Radial 2+ (normal) 2+ (normal)  Ulnar Unable to palpate  Unable to palpate   Extremities: without ischemic changes, without Gangrene, without cellulitis; without open wounds; there is a thrill within the fistula. It is somewhat aneurysmal mid arm. There are no ulcers on the fistula.  Musculoskeletal: no muscle wasting or atrophy  Neurologic: A&O X 3; Moving all extremities equally; Speech is fluent/normal  Non-Invasive Vascular Imaging:  Dialysis duplex 03/01/2019:  +------------+----------+-------------+----------+-----------------------------+  OUTFLOW VEINPSV (cm/s)Diameter (cm)Depth (cm) Describe   +------------+----------+-------------+----------+-----------------------------+  Prox UA  80  0.74  0.28    +------------+----------+-------------+----------+-----------------------------+   Mid UA 185 / 501  0.55  0.30 slight narrowing, 1.01 cm in        length   +------------+----------+-------------+----------+-----------------------------+  Dist UA  56  1.88  0.19    +------------+----------+-------------+----------+-----------------------------+  AC Fossa  285  0.48  0.37  proximal segment   +------------+----------+-------------+----------+-----------------------------+  Dst Ua  332  0.49  0.38  mid segment   +------------+----------+-------------+----------+-----------------------------+  ASSESSMENT/PLAN: 63 y.o. female with hx of left 1st stage BVT on 06/16/17, 2nd stage on 4/53/64 and plication of left arm fistula on 11/23/2018 by Dr. Donzetta [name]. here for evaluation of her hemodialysis access  -she was referred for evaluation of her fistula for low clearance. Her duplex today shows some narrowing in the mid upper arm. Will plan for fistulogram to evaluate fistula and possible intervention. Discussed with pt and she is in agreement with proceeding. She dialyzes on M/W/F.  Leontine Locket, PA-C  [facility]  [phone]  Clinic MD: Early

## 2019-03-24 NOTE — Progress Notes (Signed)
Discharge instructions reviewed with pt voices understanding.  

## 2019-03-26 DIAGNOSIS — L299 Pruritus, unspecified: Secondary | ICD-10-CM | POA: Diagnosis not present

## 2019-03-26 DIAGNOSIS — N186 End stage renal disease: Secondary | ICD-10-CM | POA: Diagnosis not present

## 2019-03-26 DIAGNOSIS — Z992 Dependence on renal dialysis: Secondary | ICD-10-CM | POA: Diagnosis not present

## 2019-03-26 DIAGNOSIS — D509 Iron deficiency anemia, unspecified: Secondary | ICD-10-CM | POA: Diagnosis not present

## 2019-03-26 DIAGNOSIS — N2581 Secondary hyperparathyroidism of renal origin: Secondary | ICD-10-CM | POA: Diagnosis not present

## 2019-03-26 DIAGNOSIS — D689 Coagulation defect, unspecified: Secondary | ICD-10-CM | POA: Diagnosis not present

## 2019-03-26 DIAGNOSIS — E1129 Type 2 diabetes mellitus with other diabetic kidney complication: Secondary | ICD-10-CM | POA: Diagnosis not present

## 2019-03-26 DIAGNOSIS — D631 Anemia in chronic kidney disease: Secondary | ICD-10-CM | POA: Diagnosis not present

## 2019-03-28 DIAGNOSIS — Z992 Dependence on renal dialysis: Secondary | ICD-10-CM | POA: Diagnosis not present

## 2019-03-28 DIAGNOSIS — N2581 Secondary hyperparathyroidism of renal origin: Secondary | ICD-10-CM | POA: Diagnosis not present

## 2019-03-28 DIAGNOSIS — D509 Iron deficiency anemia, unspecified: Secondary | ICD-10-CM | POA: Diagnosis not present

## 2019-03-28 DIAGNOSIS — D689 Coagulation defect, unspecified: Secondary | ICD-10-CM | POA: Diagnosis not present

## 2019-03-28 DIAGNOSIS — E1129 Type 2 diabetes mellitus with other diabetic kidney complication: Secondary | ICD-10-CM | POA: Diagnosis not present

## 2019-03-28 DIAGNOSIS — L299 Pruritus, unspecified: Secondary | ICD-10-CM | POA: Diagnosis not present

## 2019-03-28 DIAGNOSIS — N186 End stage renal disease: Secondary | ICD-10-CM | POA: Diagnosis not present

## 2019-03-28 DIAGNOSIS — D631 Anemia in chronic kidney disease: Secondary | ICD-10-CM | POA: Diagnosis not present

## 2019-03-29 ENCOUNTER — Encounter: Payer: Self-pay | Admitting: Family Medicine

## 2019-03-29 ENCOUNTER — Ambulatory Visit (INDEPENDENT_AMBULATORY_CARE_PROVIDER_SITE_OTHER): Payer: Medicare HMO | Admitting: Family Medicine

## 2019-03-29 ENCOUNTER — Other Ambulatory Visit: Payer: Self-pay

## 2019-03-29 VITALS — Ht 66.0 in | Wt 192.0 lb

## 2019-03-29 DIAGNOSIS — E1122 Type 2 diabetes mellitus with diabetic chronic kidney disease: Secondary | ICD-10-CM

## 2019-03-29 DIAGNOSIS — Z992 Dependence on renal dialysis: Secondary | ICD-10-CM | POA: Diagnosis not present

## 2019-03-29 DIAGNOSIS — N186 End stage renal disease: Secondary | ICD-10-CM | POA: Diagnosis not present

## 2019-03-29 MED ORDER — ONETOUCH ULTRA VI STRP: ORAL_STRIP | 12 refills | Status: DC

## 2019-03-29 NOTE — Progress Notes (Signed)
Virtual Visit via Video   I connected with patient on 03/29/19 at 10:30 AM EST by a video enabled telemedicine application and verified that I am speaking with the correct person using two identifiers.  Location patient: Home Location provider: [facility]up[facility]ist, Office Persons participating in the virtual visit: Patient, Provider, [place] (Jess B)  I discussed the limitations of evaluation and management by telemedicine and the availability of in person appointments. The patient expressed understanding and agreed to proceed.  Interactive audio and video telecommunications were attempted between this provider and patient, however failed, due to patient having technical difficulties OR patient did not have access to video capability.  We continued and completed visit with audio only.   Subjective:   HPI:   DM- chronic problem.  Attempting to control w/ diet.  UTD on eye exam.  Foot exam done by renal.  Overdue for A1C- pt did not come for labs in September.  'I feel good'.  Pt is not checking sugars b/c she is having difficulty getting strips from pharmacy.  Pt reports symptomatic lows have improved considerably.  Continues to have neuropathy but gabapentin is helping.  No CP, SOB, abd pain, N/V.  ROS:   See pertinent positives and negatives per HPI.  Patient Active Problem List   Diagnosis Date Noted  . Stress incontinence 12/24/2017  . Cough variant asthma 12/24/2017  . Tremor of left hand 12/24/2017  . History of internal jugular thrombosis 03/26/2017  . 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 Anemia 07/08/2016  . Abnormal brain MRI 07/03/2016  . Fasciculation 06/18/2016  . Carotid artery disease ([place])   . Physical exam 01/28/2016  . Hyperlipidemia 06/28/2015  . Diabetes mellitus type II, controlled ([place]) 06/28/2015  . Thoracic ascending aortic aneurysm (41 mm on Echo 06/2013) 07/06/2013  . ESRD on dialysis ([place]) 06/16/2013  . History of  renal transplant 06/16/2013  . GERD (gastroesophageal reflux disease) 08/13/2009  . OTH&UNSPEC NONINFECTIOUS GASTROENTERITIS&COLITIS 08/13/2009  . Essential hypertension 04/25/2009  . Hemorrhoids 04/25/2009  . WEIGHT LOSS 04/25/2009  . NAUSEA AND VOMITING 04/25/2009  . PERSONAL HX COLONIC POLYPS 04/25/2009    Social History   Tobacco Use  . Smoking status: Former Research scientist (life sciences)  . Smokeless tobacco: Never Used  . Tobacco comment: smoked in teens  Substance Use Topics  . Alcohol use: No    Current Outpatient Medications:  .  acetaminophen (TYLENOL) 650 MG CR tablet, Take 650-1,300 mg by mouth every 8 (eight) hours as needed for pain. , Disp: , Rfl:  .  albuterol (VENTOLIN HFA) 108 (90 Base) MCG/ACT inhaler, Inhale 2 puffs into the lungs every 4 (four) hours as needed for wheezing or shortness of breath., Disp: 1 Inhaler, Rfl: 3 .  amLODipine (NORVASC) 10 MG tablet, TAKE 1 TABLET BY MOUTH EVERY DAY, Disp: 90 tablet, Rfl: 1 .  atorvastatin (LIPITOR) 40 MG tablet, TAKE 1 TABLET BY MOUTH EVERY DAY, Disp: 90 tablet, Rfl: 1 .  blood glucose meter kit and supplies KIT, Dispense based on patient and insurance preference. Patient should test sugars twice daily as directed. Dx. E11.9, Disp: 1 each, Rfl: 0 .  cetirizine (ZYRTEC) 10 MG tablet, TAKE 1 TABLET BY MOUTH EVERY DAY, Disp: 30 tablet, Rfl: 11 .  clonazePAM (KLONOPIN) 0.5 MG tablet, TAKE 1 TABLET BY MOUTH EVERYDAY AT BEDTIME, Disp: 30 tablet, Rfl: 3 .  cloNIDine (CATAPRES) 0.1 MG tablet, Take by mouth., Disp: , Rfl:  .  ferric citrate (AURYXIA) 1 GM 210 MG(Fe) tablet, Take  210 mg by mouth See admin instructions. Take 210 mg by mouth three times a day with meals and 210 mg two times a day with snacks, Disp: , Rfl:  .  FLOVENT DISKUS 250 MCG/BLIST AEPB, Please specify directions, refills and quantity, Disp: 60 each, Rfl: 3 .  gabapentin (NEURONTIN) 300 MG capsule, Take 1 capsule (300 mg total) by mouth 3 (three) times daily., Disp: 90 capsule, Rfl:  3 .  glucose blood (ONETOUCH ULTRA) test strip, Use as instructed to test sugars twice daily. Dx. E11.9, Disp: 100 each, Rfl: 12 .  isosorbide dinitrate (ISORDIL) 20 MG tablet, TAKE 1 TABLET BY MOUTH THREE TIMES A DAY, Disp: 270 tablet, Rfl: 2 .  lidocaine-prilocaine (EMLA) cream, Apply 1 application topically as directed. Apply small amount to access site (AVF) 1 to 2 hrs before Dialysis. Cover with occlusive dressing., Disp: , Rfl: 3 .  loratadine (CLARITIN) 10 MG tablet, Take 1 tablet (10 mg total) by mouth daily., Disp: 30 tablet, Rfl: 11 .  meclizine (ANTIVERT) 25 MG tablet, Take 1 tablet (25 mg total) by mouth 3 (three) times daily as needed for dizziness., Disp: 90 tablet, Rfl: 3 .  methylPREDNISolone (MEDROL DOSEPAK) 4 MG TBPK tablet, Use as directed, Disp: 21 tablet, Rfl: 0 .  methylPREDNISolone sodium succinate (SOLU-MEDROL) 125 mg/2 mL injection, Inject into the vein., Disp: , Rfl:  .  multivitamin (RENA-VIT) TABS tablet, Take 1 tablet by mouth every morning. , Disp: , Rfl: 11 .  nitroGLYCERIN (NITROSTAT) 0.4 MG SL tablet, , Disp: , Rfl:  .  omeprazole (PRILOSEC) 40 MG capsule, TAKE 1 CAPSULE BY MOUTH EVERY DAY, Disp: 90 capsule, Rfl: 1 .  oxyCODONE-acetaminophen (PERCOCET) 5-325 MG tablet, Take 1 tablet by mouth every 6 (six) hours as needed., Disp: 8 tablet, Rfl: 0 .  QUEtiapine (SEROQUEL) 100 MG tablet, TAKE 1 TABLET BY MOUTH EVERYDAY AT BEDTIME, Disp: 90 tablet, Rfl: 1 .  traZODone (DESYREL) 50 MG tablet, TAKE 0.5-1 TABLETS (25-50 MG TOTAL) BY MOUTH AT BEDTIME AS NEEDED FOR SLEEP., Disp: 90 tablet, Rfl: 1  Current Facility-Administered Medications:  .  methylPREDNISolone acetate (DEPO-MEDROL) injection 40 mg, 40 mg, Intra-articular, Once, Hilts, Michael, MD  Allergies  Allergen Reactions  . Lisinopril Shortness Of Breath, Swelling and Other (See Comments)    Throat irritation also. Patient takes losartan and tolerates fine.  [name] [Oxaprozin] Hives and Dermatitis     Objective:   Ht _0  (1.676 m)   Wt 192 lb (87.1 kg)   BMI 30.99 kg/m   Pt is able to speak clearly, coherently without shortness of breath or increased work of breathing. Thought process is linear.  Mood is appropriate.   Assessment and Plan:   DM- chronic problem.  Has been able to control w/o medication recently but is overdue on A1C.  UTD on eye exam, foot exam.  Has chronic renal failure on HD.  Pt reports she currently feels good and is asymptomatic.  Prescription sent for test strips as she has been having difficulty getting these.  Stressed need for regular labs.  Pt expressed understanding and is in agreement w/ plan.    Annye Asa, MD 03/29/2019  Time spent with the patient: 12 minutes, of which >50% was spent in obtaining information about symptoms, reviewing previous labs, evaluations, and treatments, counseling about condition (please see the discussed topics above), and developing a plan to further investigate it; had a number of questions which I addressed.

## 2019-03-29 NOTE — Progress Notes (Signed)
I have discussed the procedure for the virtual visit with the patient who has given consent to proceed with assessment and treatment.   Pt unable to obtain vitals.   Marian Meneely L Latania Bascomb, CMA     

## 2019-03-30 ENCOUNTER — Emergency Department (HOSPITAL_COMMUNITY): Payer: Medicare HMO

## 2019-03-30 ENCOUNTER — Emergency Department (HOSPITAL_COMMUNITY)
Admission: EM | Admit: 2019-03-30 | Discharge: 2019-03-30 | Disposition: A | Discharge status: Home / Self Care | Payer: Medicare HMO | Attending: Emergency Medicine | Admitting: Emergency Medicine

## 2019-03-30 ENCOUNTER — Encounter (HOSPITAL_COMMUNITY): Payer: Self-pay | Admitting: *Deleted

## 2019-03-30 ENCOUNTER — Other Ambulatory Visit: Payer: Self-pay

## 2019-03-30 ENCOUNTER — Telehealth: Payer: Self-pay | Admitting: Family Medicine

## 2019-03-30 DIAGNOSIS — D689 Coagulation defect, unspecified: Secondary | ICD-10-CM | POA: Diagnosis not present

## 2019-03-30 DIAGNOSIS — W109XXA Fall (on) (from) unspecified stairs and steps, initial encounter: Secondary | ICD-10-CM | POA: Insufficient documentation

## 2019-03-30 DIAGNOSIS — Y999 Unspecified external cause status: Secondary | ICD-10-CM | POA: Diagnosis not present

## 2019-03-30 DIAGNOSIS — S93402A Sprain of unspecified ligament of left ankle, initial encounter: Secondary | ICD-10-CM | POA: Insufficient documentation

## 2019-03-30 DIAGNOSIS — D631 Anemia in chronic kidney disease: Secondary | ICD-10-CM | POA: Diagnosis not present

## 2019-03-30 DIAGNOSIS — S99912A Unspecified injury of left ankle, initial encounter: Secondary | ICD-10-CM | POA: Diagnosis not present

## 2019-03-30 DIAGNOSIS — R0902 Hypoxemia: Secondary | ICD-10-CM | POA: Diagnosis not present

## 2019-03-30 DIAGNOSIS — I120 Hypertensive chronic kidney disease with stage 5 chronic kidney disease or end stage renal disease: Secondary | ICD-10-CM | POA: Insufficient documentation

## 2019-03-30 DIAGNOSIS — Y92019 Unspecified place in single-family (private) house as the place of occurrence of the external cause: Secondary | ICD-10-CM | POA: Diagnosis not present

## 2019-03-30 DIAGNOSIS — T07XXXA Unspecified multiple injuries, initial encounter: Secondary | ICD-10-CM | POA: Diagnosis not present

## 2019-03-30 DIAGNOSIS — Y939 Activity, unspecified: Secondary | ICD-10-CM | POA: Diagnosis not present

## 2019-03-30 DIAGNOSIS — S82154A Nondisplaced fracture of right tibial tuberosity, initial encounter for closed fracture: Secondary | ICD-10-CM | POA: Diagnosis not present

## 2019-03-30 DIAGNOSIS — Z992 Dependence on renal dialysis: Secondary | ICD-10-CM | POA: Diagnosis not present

## 2019-03-30 DIAGNOSIS — E1122 Type 2 diabetes mellitus with diabetic chronic kidney disease: Secondary | ICD-10-CM | POA: Diagnosis not present

## 2019-03-30 DIAGNOSIS — N2581 Secondary hyperparathyroidism of renal origin: Secondary | ICD-10-CM | POA: Diagnosis not present

## 2019-03-30 DIAGNOSIS — I10 Essential (primary) hypertension: Secondary | ICD-10-CM | POA: Diagnosis not present

## 2019-03-30 DIAGNOSIS — Y929 Unspecified place or not applicable: Secondary | ICD-10-CM | POA: Insufficient documentation

## 2019-03-30 DIAGNOSIS — E1129 Type 2 diabetes mellitus with other diabetic kidney complication: Secondary | ICD-10-CM | POA: Diagnosis not present

## 2019-03-30 DIAGNOSIS — L299 Pruritus, unspecified: Secondary | ICD-10-CM | POA: Diagnosis not present

## 2019-03-30 DIAGNOSIS — M25561 Pain in right knee: Secondary | ICD-10-CM | POA: Diagnosis not present

## 2019-03-30 DIAGNOSIS — W19XXXA Unspecified fall, initial encounter: Secondary | ICD-10-CM | POA: Diagnosis not present

## 2019-03-30 DIAGNOSIS — S82141A Displaced bicondylar fracture of right tibia, initial encounter for closed fracture: Secondary | ICD-10-CM

## 2019-03-30 DIAGNOSIS — D509 Iron deficiency anemia, unspecified: Secondary | ICD-10-CM | POA: Diagnosis not present

## 2019-03-30 DIAGNOSIS — M2550 Pain in unspecified joint: Secondary | ICD-10-CM | POA: Diagnosis not present

## 2019-03-30 DIAGNOSIS — R52 Pain, unspecified: Secondary | ICD-10-CM | POA: Diagnosis not present

## 2019-03-30 DIAGNOSIS — Z7401 Bed confinement status: Secondary | ICD-10-CM | POA: Diagnosis not present

## 2019-03-30 DIAGNOSIS — N186 End stage renal disease: Secondary | ICD-10-CM | POA: Insufficient documentation

## 2019-03-30 DIAGNOSIS — Z79899 Other long term (current) drug therapy: Secondary | ICD-10-CM | POA: Diagnosis not present

## 2019-03-30 DIAGNOSIS — IMO0001 Reserved for inherently not codable concepts without codable children: Secondary | ICD-10-CM

## 2019-03-30 MED ORDER — OXYCODONE-ACETAMINOPHEN 5-325 MG PO TABS: 1.0000 | ORAL_TABLET | Freq: Four times a day (QID) | ORAL | 0 refills | Status: DC | PRN

## 2019-03-30 NOTE — Discharge Instructions (Addendum)
Call the Orthopaedist to schedule appointment for evaluation

## 2019-03-30 NOTE — ED Notes (Signed)
Ortho tech paged  

## 2019-03-30 NOTE — ED Provider Notes (Addendum)
[facility] EMERGENCY DEPARTMENT Provider Note   CSN: [id number] Arrival date & time: 03/30/19  1108     History Chief Complaint  Patient presents with  . Fall    [name] [name] is a 64 y.o. female.  The history is provided by the patient. No language interpreter was used.  Fall This is a new problem. The problem occurs constantly. The problem has not changed since onset.Nothing aggravates the symptoms. Nothing relieves the symptoms. She has tried nothing for the symptoms. The treatment provided no relief.  Pt reports she fell down 2 steps at her home.  Pt complains of pain in her left ankle and her right knee.       Past Medical History:  Diagnosis Date  . Adenomatous colon polyp 04/1998  . Anemia   . Anxiety   . Arthritis   . Carotid artery disease ([place])    Carotid US 1/18: bilateral ICA 1-39, R thyroid lobe nodule (1.9x2.2x3cm); numerous L thyroid lobe nodules - repeat 1 year  . Chronic kidney disease    M/W/[facility]  . Chronic renal failure    post transplant  . Depression   . Diabetes mellitus    diet controlled  . EBV infection    Per [facility]  . Encephalitis    NMDA per [facility]  . Esophagitis    Grade 1 Distal  . Focal seizure (HCC)    .  Last one 06/2016  . GERD (gastroesophageal reflux disease)   . Hemorrhoids   . History of blood transfusion   . History of kidney stones    Passed  . History of subdural hematoma   . Hx of cardiovascular stress test    Lexiscan Myoview (06/2013):  No ischemia, EF 66%; normal.  //  Myoview 12/17: EF 62, no ischemia or scar; Normal  . Hx of echocardiogram    a. Echocardiogram (06/2013):  Mod focal basal hypertrophy, EF 60-65%, normal wall motion, Gr 1 DD, mild AI, mildly dilated ascending aorta (41 mm), mild LAE.; b.  Echo 9/16: mod LVH, EF 60-65%, no RWMA, Gr 1 DD, trivial AI, mild dilated ascending aorta, mild LAE  . Hyperkalemia   . Hyperlipidemia   . Hypertension   . Hypomagnesemia   . Left jugular  vein thrombosis    Partial resolved per [facility]  . Metabolic acidosis   . Pneumonia   . Right jugular vein thrombosis    resolved from [facility]  . Seizures [facility])     Patient Active Problem List   Diagnosis Date Noted  . Stress incontinence 12/24/2017  . Cough variant asthma 12/24/2017  . Tremor of left hand 12/24/2017  . History of internal jugular thrombosis 03/26/2017  . 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 Anemia 07/08/2016  . Abnormal brain MRI 07/03/2016  . Fasciculation 06/18/2016  . Carotid artery disease ([place])   . Physical exam 01/28/2016  . Hyperlipidemia 06/28/2015  . Diabetes mellitus type II, controlled ([place]) 06/28/2015  . Thoracic ascending aortic aneurysm (41 mm on Echo 06/2013) 07/06/2013  . ESRD on dialysis ([place]) 06/16/2013  . History of renal transplant 06/16/2013  . GERD (gastroesophageal reflux disease) 08/13/2009  . OTH&UNSPEC NONINFECTIOUS GASTROENTERITIS&COLITIS 08/13/2009  . Essential hypertension 04/25/2009  . Hemorrhoids 04/25/2009  . WEIGHT LOSS 04/25/2009  . NAUSEA AND VOMITING 04/25/2009  . PERSONAL HX COLONIC POLYPS 04/25/2009    Past Surgical History:  Procedure Laterality Date  . A/V FISTULAGRAM Left 06/04/2017   Procedure: A/V FISTULAGRAM;  Surgeon: Conrad Owl [place], MD;  Location: [place] CV LAB;  Service: Cardiovascular;  Laterality: Left;  . A/V FISTULAGRAM N/A 03/24/2019   Procedure: A/V FISTULAGRAM - left arm;  Surgeon: [name], MD;  Location: [place] CV LAB;  Service: Cardiovascular;  Laterality: N/A;  . ABDOMINAL HYSTERECTOMY    . AV FISTULA PLACEMENT  07/04/2005   [place] AV fistula  . AV FISTULA PLACEMENT  08/27/2005  . AV FISTULA PLACEMENT Left 04/29/2017   Procedure: Creation of Left Arm ARTERIOVENOUS BRACHIOCEPHALIC FISTULA;  Surgeon: Elam Dutch, MD;  Location: [place];  Service: Vascular;  Laterality: Left;  . AV FISTULA PLACEMENT Left 06/16/2017   Procedure: BRACHIO_BASCILIC  ARTERIOVENOUS (AV) FISTULA CREATION;  Surgeon: Waynetta Sandy, MD;  Location: [place];  Service: Vascular;  Laterality: Left;  . AV FISTULA PLACEMENT W/ PTFE  08/27/2005  . BASCILIC VEIN TRANSPOSITION Left 06/16/2017   Procedure: BRACHIOCEPHALIC TRANSPOSITION  LEFT ARM;  Surgeon: Waynetta Sandy, MD;  Location: [place];  Service: Vascular;  Laterality: Left;  . BASCILIC VEIN TRANSPOSITION Left 09/01/2017   Procedure: BASILIC VEIN TRANSPOSITION SECOND STAGE;  Surgeon: Waynetta Sandy, MD;  Location: [place];  Service: Vascular;  Laterality: Left;  . BRAIN BIOPSY    . CESAREAN SECTION    . COLONOSCOPY W/ POLYPECTOMY    . DG AV DIALYSIS GRAFT DECLOT OR  07/24/2005   AV Gore-Tex graf  . DG AV DIALYSIS GRAFT DECLOT OR  Thrombosis right forearm, loop arteriovenous   Thrombosis right forearm, loop arteriovenous graft  . GASTROSTOMY TUBE PLACEMENT    . KIDNEY TRANSPLANT  2009   Both  . PERIPHERAL VASCULAR BALLOON ANGIOPLASTY Left 03/24/2019   Procedure: PERIPHERAL VASCULAR BALLOON ANGIOPLASTY;  Surgeon: [name], MD;  Location: [place] CV LAB;  Service: Cardiovascular;  Laterality: Left;  left AV fistula  . REMOVAL OF GASTROSTOMY TUBE    . REVISON OF ARTERIOVENOUS FISTULA Left 11/23/2018   Procedure: REVISION PLICATION OF ARTERIOVENOUS FISTULA;  Surgeon: Waynetta Sandy, MD;  Location: [place];  Service: Vascular;  Laterality: Left;  . THROMBECTOMY / ARTERIOVENOUS GRAFT REVISION  10/12/2006  . THROMBECTOMY / ARTERIOVENOUS GRAFT REVISION  10/16/2006     OB History   No obstetric history on file.     Family History  Problem Relation Age of Onset  . Kidney disease Paternal Aunt   . Heart disease Mother   . Heart disease Father   . Colon cancer Neg Hx     Social History   Tobacco Use  . Smoking status: Former Research scientist (life sciences)  . Smokeless tobacco: Never Used  . Tobacco comment: smoked in teens  Substance Use Topics  . Alcohol use: No  . Drug use:  No    Home Medications Prior to Admission medications   Medication Sig Start Date End Date Taking? Authorizing Provider  acetaminophen (TYLENOL) 650 MG CR tablet Take 650-1,300 mg by mouth every 8 (eight) hours as needed for pain.     [provider]  albuterol (VENTOLIN HFA) 108 (90 Base) MCG/ACT inhaler Inhale 2 puffs into the lungs every 4 (four) hours as needed for wheezing or shortness of breath. 03/29/18   Midge Minium, MD  amLODipine (NORVASC) 10 MG tablet TAKE 1 TABLET BY MOUTH EVERY DAY 03/08/19   Midge Minium, MD  atorvastatin (LIPITOR) 40 MG tablet TAKE 1 TABLET BY MOUTH EVERY DAY 02/01/18   Midge Minium, MD  blood glucose meter kit and supplies KIT  Dispense based on patient and insurance preference. Patient should test sugars twice daily as directed. Dx. E11.9 12/10/18   Midge Minium, MD  cetirizine (ZYRTEC) 10 MG tablet TAKE 1 TABLET BY MOUTH EVERY DAY 05/25/18   Midge Minium, MD  clonazePAM (KLONOPIN) 0.5 MG tablet TAKE 1 TABLET BY MOUTH EVERYDAY AT BEDTIME 09/27/18   Midge Minium, MD  cloNIDine (CATAPRES) 0.1 MG tablet Take by mouth. 12/12/16   [provider]  ferric citrate (AURYXIA) 1 GM 210 MG(Fe) tablet Take 210 mg by mouth See admin instructions. Take 210 mg by mouth three times a day with meals and 210 mg two times a day with snacks    [provider]  [place] 250 MCG/BLIST AEPB Please specify directions, refills and quantity 05/07/18   Midge Minium, MD  gabapentin (NEURONTIN) 300 MG capsule Take 1 capsule (300 mg total) by mouth 3 (three) times daily. 12/07/18   Midge Minium, MD  glucose blood (ONETOUCH ULTRA) test strip Use as instructed to test sugars twice daily. Dx. E11.9 03/29/19   Midge Minium, MD  isosorbide dinitrate (ISORDIL) 20 MG tablet TAKE 1 TABLET BY MOUTH THREE TIMES A DAY 02/02/19   Midge Minium, MD  lidocaine-prilocaine (EMLA) cream Apply 1 application topically as  directed. Apply small amount to access site (AVF) 1 to 2 hrs before Dialysis. Cover with occlusive dressing. 10/07/17   [provider]  loratadine (CLARITIN) 10 MG tablet Take 1 tablet (10 mg total) by mouth daily. 03/09/18   Midge Minium, MD  meclizine (ANTIVERT) 25 MG tablet Take 1 tablet (25 mg total) by mouth 3 (three) times daily as needed for dizziness. 12/07/18   Midge Minium, MD  methylPREDNISolone (MEDROL DOSEPAK) 4 MG TBPK tablet Use as directed 06/24/18   Leandrew Koyanagi, MD  methylPREDNISolone sodium succinate (SOLU-MEDROL) 125 mg/2 mL injection Inject into the vein.    [provider]  multivitamin (RENA-VIT) TABS tablet Take 1 tablet by mouth every morning.  03/20/17   [provider]  nitroGLYCERIN (NITROSTAT) 0.4 MG SL tablet  06/16/13   [provider]  omeprazole (PRILOSEC) 40 MG capsule TAKE 1 CAPSULE BY MOUTH EVERY DAY 11/04/18   Midge Minium, MD  oxyCODONE-acetaminophen (PERCOCET) 5-325 MG tablet Take 1 tablet by mouth every 6 (six) hours as needed for severe pain. 03/30/19 03/29/20  Fransico Meadow, PA-C  QUEtiapine (SEROQUEL) 100 MG tablet TAKE 1 TABLET BY MOUTH EVERYDAY AT BEDTIME 03/08/19   Midge Minium, MD  traZODone (DESYREL) 50 MG tablet TAKE 0.5-1 TABLETS (25-50 MG TOTAL) BY MOUTH AT BEDTIME AS NEEDED FOR SLEEP. 11/04/18   Midge Minium, MD    Allergies    Lisinopril and Daypro [oxaprozin]  Review of Systems   Review of Systems  All other systems reviewed and are negative.   Physical Exam Updated Vital Signs BP 137/79 (BP Location: Right Arm)   Pulse 76   Temp 98.9 F (37.2 C) (Oral)   Resp 16   SpO2 98%   Physical Exam Vitals reviewed.  Cardiovascular:     Rate and Rhythm: Normal rate.  Pulmonary:     Effort: Pulmonary effort is normal.  Musculoskeletal:        General: Swelling and tenderness present.     Comments: Tender swollen right knee, pain with movement, nv nad ns intact.  Left ankle  minimal tenderness, good range of motion nv and ns intact   Skin:  General: Skin is warm.  Neurological:     General: No focal deficit present.     Mental Status: She is alert.  Psychiatric:        Mood and Affect: Mood normal.     ED Results / Procedures / Treatments   Labs (all labs ordered are listed, but only abnormal results are displayed) Labs Reviewed - No data to display  EKG None  Radiology DG Ankle Complete Left  Result Date: 03/30/2019 CLINICAL DATA:  Fall EXAM: LEFT ANKLE COMPLETE - 3+ VIEW COMPARISON:  None. FINDINGS: Alignment is anatomic. There is no acute fracture. Joint spaces are preserved. IMPRESSION: No acute fracture. Electronically Signed   By: Macy Mis M.D.   On: 03/30/2019 11:52   CT Knee Right Wo Contrast  Result Date: 03/30/2019 CLINICAL DATA:  Status post fall.  Right knee pain. EXAM: CT OF THE RIGHT KNEE WITHOUT CONTRAST TECHNIQUE: Multidetector CT imaging of the RIGHT knee was performed according to the standard protocol. Multiplanar CT image reconstructions were also generated. COMPARISON:  None. FINDINGS: Bones/Joint/Cartilage Acute nondisplaced fracture of the tibial tuberosity. No other acute fracture or dislocation. No aggressive osseous lesion. Normal alignment. No joint effusion. Mild medial scratch them mild lateral femorotibial compartment joint space narrowing with subchondral cysts in the lateral tibial plateau. Ligaments Ligaments are suboptimally evaluated by CT. ACL and PCL are grossly intact. Muscles and Tendons Muscles are normal. Patellar tendon and quadriceps tendon are intact. Soft tissue No fluid collection or hematoma. No soft tissue mass. Peripheral vascular atherosclerotic disease. IMPRESSION: 1. Acute nondisplaced fracture of the tibial tuberosity. Electronically Signed   By: Kathreen Devoid   On: 03/30/2019 13:48   DG Knee Complete 4 Views Right  Result Date: 03/30/2019 CLINICAL DATA:  Fall with right knee pain EXAM: RIGHT KNEE -  COMPLETE 4+ VIEW COMPARISON:  None. FINDINGS: Lucency at the level of the tibial tuberosity which does not continue more posteriorly on the lateral view. No knee joint effusion or malalignment. No significant degenerative changes. Prominent atherosclerotic calcification. IMPRESSION: Lucency through the tibial tuberosity that is compatible with fracture but is limited in extent. Assuming this is the site of tenderness a CT could further evaluate. Electronically Signed   By: Monte Fantasia M.D.   On: 03/30/2019 11:54    Procedures Procedures (including critical care time)  Medications Ordered in ED Medications - No data to display  ED Course  I have reviewed the triage vital signs and the nursing notes.  Pertinent labs & imaging results that were available during my care of the patient were reviewed by me and considered in my medical decision making (see chart for details).    MDM Rules/Calculators/A&P                      MDM  Ct obtained to evaluate tibial fracture.  I spoke with Orion Crook Pa on call for Orthopaedist.  He advised knee immbolizer and follow up with orthopaedist.   Pt placed in a knee immbolizer.  Final Clinical Impression(s) / ED Diagnoses Final diagnoses:  Tibial plateau fracture, right, closed, initial encounter  First degree ankle sprain, left, initial encounter    Rx / DC Orders ED Discharge Orders         Ordered    oxyCODONE-acetaminophen (PERCOCET) 5-325 MG tablet  Every 6 hours PRN     03/30/19 1413        An After Visit Summary was printed and given to the  patient.    Fransico Meadow, PA-C 03/30/19 2053    Fransico Meadow, PA-C 03/30/19 2053    Deno Etienne, DO 03/31/19 [phone]

## 2019-03-30 NOTE — Telephone Encounter (Signed)
Pt called in wanting to make Tabori aware that she is at [facility]. ER due to a fall.

## 2019-03-30 NOTE — Telephone Encounter (Signed)
FYI

## 2019-03-30 NOTE — ED Triage Notes (Signed)
Pt arrived by [facility]. Reports falling down two steps and now has left ankle pain and right knee pain. Went to dialysis and completed her treatment. No acute distress is noted.

## 2019-03-30 NOTE — ED Notes (Signed)
Patient verbalizes understanding of discharge instructions. Opportunity for questioning and answers were provided. Armband removed by staff, pt discharged from ED.  

## 2019-03-30 NOTE — Progress Notes (Signed)
Orthopedic Tech Progress Note Patient Details:  [name] [name] 01/08/1956 [id number]  Ortho Devices Type of Ortho Device: Ace wrap, Crutches, Knee Immobilizer Ortho Device/Splint Location: rle ki. lle ankle ace wrap. Ortho Device/Splint Interventions: Ordered, Application, Adjustment   Post Interventions Patient Tolerated: Well Instructions Provided: Care of device, Adjustment of device   [name] 03/30/2019, 3:15 PM

## 2019-03-31 ENCOUNTER — Other Ambulatory Visit: Payer: Self-pay | Admitting: Family Medicine

## 2019-03-31 ENCOUNTER — Ambulatory Visit: Payer: Medicare HMO

## 2019-04-02 DIAGNOSIS — D689 Coagulation defect, unspecified: Secondary | ICD-10-CM | POA: Diagnosis not present

## 2019-04-02 DIAGNOSIS — N2581 Secondary hyperparathyroidism of renal origin: Secondary | ICD-10-CM | POA: Diagnosis not present

## 2019-04-02 DIAGNOSIS — D509 Iron deficiency anemia, unspecified: Secondary | ICD-10-CM | POA: Diagnosis not present

## 2019-04-02 DIAGNOSIS — R5381 Other malaise: Secondary | ICD-10-CM | POA: Diagnosis not present

## 2019-04-02 DIAGNOSIS — D631 Anemia in chronic kidney disease: Secondary | ICD-10-CM | POA: Diagnosis not present

## 2019-04-02 DIAGNOSIS — R279 Unspecified lack of coordination: Secondary | ICD-10-CM | POA: Diagnosis not present

## 2019-04-02 DIAGNOSIS — L299 Pruritus, unspecified: Secondary | ICD-10-CM | POA: Diagnosis not present

## 2019-04-02 DIAGNOSIS — R52 Pain, unspecified: Secondary | ICD-10-CM | POA: Diagnosis not present

## 2019-04-02 DIAGNOSIS — E1129 Type 2 diabetes mellitus with other diabetic kidney complication: Secondary | ICD-10-CM | POA: Diagnosis not present

## 2019-04-02 DIAGNOSIS — R0902 Hypoxemia: Secondary | ICD-10-CM | POA: Diagnosis not present

## 2019-04-02 DIAGNOSIS — Z743 Need for continuous supervision: Secondary | ICD-10-CM | POA: Diagnosis not present

## 2019-04-02 DIAGNOSIS — N186 End stage renal disease: Secondary | ICD-10-CM | POA: Diagnosis not present

## 2019-04-02 DIAGNOSIS — Z992 Dependence on renal dialysis: Secondary | ICD-10-CM | POA: Diagnosis not present

## 2019-04-04 DIAGNOSIS — N186 End stage renal disease: Secondary | ICD-10-CM | POA: Diagnosis not present

## 2019-04-04 DIAGNOSIS — L299 Pruritus, unspecified: Secondary | ICD-10-CM | POA: Diagnosis not present

## 2019-04-04 DIAGNOSIS — N2581 Secondary hyperparathyroidism of renal origin: Secondary | ICD-10-CM | POA: Diagnosis not present

## 2019-04-04 DIAGNOSIS — D689 Coagulation defect, unspecified: Secondary | ICD-10-CM | POA: Diagnosis not present

## 2019-04-04 DIAGNOSIS — Z992 Dependence on renal dialysis: Secondary | ICD-10-CM | POA: Diagnosis not present

## 2019-04-04 DIAGNOSIS — D631 Anemia in chronic kidney disease: Secondary | ICD-10-CM | POA: Diagnosis not present

## 2019-04-04 DIAGNOSIS — D509 Iron deficiency anemia, unspecified: Secondary | ICD-10-CM | POA: Diagnosis not present

## 2019-04-04 DIAGNOSIS — E1129 Type 2 diabetes mellitus with other diabetic kidney complication: Secondary | ICD-10-CM | POA: Diagnosis not present

## 2019-04-05 ENCOUNTER — Ambulatory Visit: Payer: Medicare Other

## 2019-04-06 DIAGNOSIS — D689 Coagulation defect, unspecified: Secondary | ICD-10-CM | POA: Diagnosis not present

## 2019-04-06 DIAGNOSIS — D509 Iron deficiency anemia, unspecified: Secondary | ICD-10-CM | POA: Diagnosis not present

## 2019-04-06 DIAGNOSIS — E1129 Type 2 diabetes mellitus with other diabetic kidney complication: Secondary | ICD-10-CM | POA: Diagnosis not present

## 2019-04-06 DIAGNOSIS — Z992 Dependence on renal dialysis: Secondary | ICD-10-CM | POA: Diagnosis not present

## 2019-04-06 DIAGNOSIS — N2581 Secondary hyperparathyroidism of renal origin: Secondary | ICD-10-CM | POA: Diagnosis not present

## 2019-04-06 DIAGNOSIS — D631 Anemia in chronic kidney disease: Secondary | ICD-10-CM | POA: Diagnosis not present

## 2019-04-06 DIAGNOSIS — L299 Pruritus, unspecified: Secondary | ICD-10-CM | POA: Diagnosis not present

## 2019-04-06 DIAGNOSIS — N186 End stage renal disease: Secondary | ICD-10-CM | POA: Diagnosis not present

## 2019-04-08 DIAGNOSIS — N2581 Secondary hyperparathyroidism of renal origin: Secondary | ICD-10-CM | POA: Diagnosis not present

## 2019-04-08 DIAGNOSIS — D689 Coagulation defect, unspecified: Secondary | ICD-10-CM | POA: Diagnosis not present

## 2019-04-08 DIAGNOSIS — Z992 Dependence on renal dialysis: Secondary | ICD-10-CM | POA: Diagnosis not present

## 2019-04-08 DIAGNOSIS — N186 End stage renal disease: Secondary | ICD-10-CM | POA: Diagnosis not present

## 2019-04-08 DIAGNOSIS — D631 Anemia in chronic kidney disease: Secondary | ICD-10-CM | POA: Diagnosis not present

## 2019-04-08 DIAGNOSIS — D509 Iron deficiency anemia, unspecified: Secondary | ICD-10-CM | POA: Diagnosis not present

## 2019-04-08 DIAGNOSIS — E1129 Type 2 diabetes mellitus with other diabetic kidney complication: Secondary | ICD-10-CM | POA: Diagnosis not present

## 2019-04-08 DIAGNOSIS — L299 Pruritus, unspecified: Secondary | ICD-10-CM | POA: Diagnosis not present

## 2019-04-11 DIAGNOSIS — D509 Iron deficiency anemia, unspecified: Secondary | ICD-10-CM | POA: Diagnosis not present

## 2019-04-11 DIAGNOSIS — D631 Anemia in chronic kidney disease: Secondary | ICD-10-CM | POA: Diagnosis not present

## 2019-04-11 DIAGNOSIS — L299 Pruritus, unspecified: Secondary | ICD-10-CM | POA: Diagnosis not present

## 2019-04-11 DIAGNOSIS — N2581 Secondary hyperparathyroidism of renal origin: Secondary | ICD-10-CM | POA: Diagnosis not present

## 2019-04-11 DIAGNOSIS — Z992 Dependence on renal dialysis: Secondary | ICD-10-CM | POA: Diagnosis not present

## 2019-04-11 DIAGNOSIS — E1129 Type 2 diabetes mellitus with other diabetic kidney complication: Secondary | ICD-10-CM | POA: Diagnosis not present

## 2019-04-11 DIAGNOSIS — D689 Coagulation defect, unspecified: Secondary | ICD-10-CM | POA: Diagnosis not present

## 2019-04-11 DIAGNOSIS — N186 End stage renal disease: Secondary | ICD-10-CM | POA: Diagnosis not present

## 2019-04-12 DIAGNOSIS — S93402A Sprain of unspecified ligament of left ankle, initial encounter: Secondary | ICD-10-CM | POA: Diagnosis not present

## 2019-04-12 DIAGNOSIS — S82141A Displaced bicondylar fracture of right tibia, initial encounter for closed fracture: Secondary | ICD-10-CM | POA: Diagnosis not present

## 2019-04-13 DIAGNOSIS — L299 Pruritus, unspecified: Secondary | ICD-10-CM | POA: Diagnosis not present

## 2019-04-13 DIAGNOSIS — Z992 Dependence on renal dialysis: Secondary | ICD-10-CM | POA: Diagnosis not present

## 2019-04-13 DIAGNOSIS — D509 Iron deficiency anemia, unspecified: Secondary | ICD-10-CM | POA: Diagnosis not present

## 2019-04-13 DIAGNOSIS — N186 End stage renal disease: Secondary | ICD-10-CM | POA: Diagnosis not present

## 2019-04-13 DIAGNOSIS — D689 Coagulation defect, unspecified: Secondary | ICD-10-CM | POA: Diagnosis not present

## 2019-04-13 DIAGNOSIS — E1129 Type 2 diabetes mellitus with other diabetic kidney complication: Secondary | ICD-10-CM | POA: Diagnosis not present

## 2019-04-13 DIAGNOSIS — N2581 Secondary hyperparathyroidism of renal origin: Secondary | ICD-10-CM | POA: Diagnosis not present

## 2019-04-13 DIAGNOSIS — D631 Anemia in chronic kidney disease: Secondary | ICD-10-CM | POA: Diagnosis not present

## 2019-04-15 DIAGNOSIS — E1129 Type 2 diabetes mellitus with other diabetic kidney complication: Secondary | ICD-10-CM | POA: Diagnosis not present

## 2019-04-15 DIAGNOSIS — Z992 Dependence on renal dialysis: Secondary | ICD-10-CM | POA: Diagnosis not present

## 2019-04-15 DIAGNOSIS — D631 Anemia in chronic kidney disease: Secondary | ICD-10-CM | POA: Diagnosis not present

## 2019-04-15 DIAGNOSIS — D689 Coagulation defect, unspecified: Secondary | ICD-10-CM | POA: Diagnosis not present

## 2019-04-15 DIAGNOSIS — D509 Iron deficiency anemia, unspecified: Secondary | ICD-10-CM | POA: Diagnosis not present

## 2019-04-15 DIAGNOSIS — N2581 Secondary hyperparathyroidism of renal origin: Secondary | ICD-10-CM | POA: Diagnosis not present

## 2019-04-15 DIAGNOSIS — L299 Pruritus, unspecified: Secondary | ICD-10-CM | POA: Diagnosis not present

## 2019-04-15 DIAGNOSIS — N186 End stage renal disease: Secondary | ICD-10-CM | POA: Diagnosis not present

## 2019-04-18 DIAGNOSIS — E1129 Type 2 diabetes mellitus with other diabetic kidney complication: Secondary | ICD-10-CM | POA: Diagnosis not present

## 2019-04-18 DIAGNOSIS — N2581 Secondary hyperparathyroidism of renal origin: Secondary | ICD-10-CM | POA: Diagnosis not present

## 2019-04-18 DIAGNOSIS — L299 Pruritus, unspecified: Secondary | ICD-10-CM | POA: Diagnosis not present

## 2019-04-18 DIAGNOSIS — D509 Iron deficiency anemia, unspecified: Secondary | ICD-10-CM | POA: Diagnosis not present

## 2019-04-18 DIAGNOSIS — N186 End stage renal disease: Secondary | ICD-10-CM | POA: Diagnosis not present

## 2019-04-18 DIAGNOSIS — Z992 Dependence on renal dialysis: Secondary | ICD-10-CM | POA: Diagnosis not present

## 2019-04-18 DIAGNOSIS — D631 Anemia in chronic kidney disease: Secondary | ICD-10-CM | POA: Diagnosis not present

## 2019-04-18 DIAGNOSIS — D689 Coagulation defect, unspecified: Secondary | ICD-10-CM | POA: Diagnosis not present

## 2019-04-20 DIAGNOSIS — D631 Anemia in chronic kidney disease: Secondary | ICD-10-CM | POA: Diagnosis not present

## 2019-04-20 DIAGNOSIS — L299 Pruritus, unspecified: Secondary | ICD-10-CM | POA: Diagnosis not present

## 2019-04-20 DIAGNOSIS — E1129 Type 2 diabetes mellitus with other diabetic kidney complication: Secondary | ICD-10-CM | POA: Diagnosis not present

## 2019-04-20 DIAGNOSIS — N186 End stage renal disease: Secondary | ICD-10-CM | POA: Diagnosis not present

## 2019-04-20 DIAGNOSIS — D509 Iron deficiency anemia, unspecified: Secondary | ICD-10-CM | POA: Diagnosis not present

## 2019-04-20 DIAGNOSIS — Z992 Dependence on renal dialysis: Secondary | ICD-10-CM | POA: Diagnosis not present

## 2019-04-20 DIAGNOSIS — D689 Coagulation defect, unspecified: Secondary | ICD-10-CM | POA: Diagnosis not present

## 2019-04-20 DIAGNOSIS — N2581 Secondary hyperparathyroidism of renal origin: Secondary | ICD-10-CM | POA: Diagnosis not present

## 2019-04-22 DIAGNOSIS — E1129 Type 2 diabetes mellitus with other diabetic kidney complication: Secondary | ICD-10-CM | POA: Diagnosis not present

## 2019-04-22 DIAGNOSIS — D509 Iron deficiency anemia, unspecified: Secondary | ICD-10-CM | POA: Diagnosis not present

## 2019-04-22 DIAGNOSIS — L299 Pruritus, unspecified: Secondary | ICD-10-CM | POA: Diagnosis not present

## 2019-04-22 DIAGNOSIS — N186 End stage renal disease: Secondary | ICD-10-CM | POA: Diagnosis not present

## 2019-04-22 DIAGNOSIS — N2581 Secondary hyperparathyroidism of renal origin: Secondary | ICD-10-CM | POA: Diagnosis not present

## 2019-04-22 DIAGNOSIS — Z992 Dependence on renal dialysis: Secondary | ICD-10-CM | POA: Diagnosis not present

## 2019-04-22 DIAGNOSIS — D631 Anemia in chronic kidney disease: Secondary | ICD-10-CM | POA: Diagnosis not present

## 2019-04-22 DIAGNOSIS — D689 Coagulation defect, unspecified: Secondary | ICD-10-CM | POA: Diagnosis not present

## 2019-04-24 DIAGNOSIS — I150 Renovascular hypertension: Secondary | ICD-10-CM | POA: Diagnosis not present

## 2019-04-24 DIAGNOSIS — Z992 Dependence on renal dialysis: Secondary | ICD-10-CM | POA: Diagnosis not present

## 2019-04-24 DIAGNOSIS — N186 End stage renal disease: Secondary | ICD-10-CM | POA: Diagnosis not present

## 2019-04-25 DIAGNOSIS — N2581 Secondary hyperparathyroidism of renal origin: Secondary | ICD-10-CM | POA: Diagnosis not present

## 2019-04-25 DIAGNOSIS — Z992 Dependence on renal dialysis: Secondary | ICD-10-CM | POA: Diagnosis not present

## 2019-04-25 DIAGNOSIS — D631 Anemia in chronic kidney disease: Secondary | ICD-10-CM | POA: Diagnosis not present

## 2019-04-25 DIAGNOSIS — E1129 Type 2 diabetes mellitus with other diabetic kidney complication: Secondary | ICD-10-CM | POA: Diagnosis not present

## 2019-04-25 DIAGNOSIS — N186 End stage renal disease: Secondary | ICD-10-CM | POA: Diagnosis not present

## 2019-04-25 DIAGNOSIS — Z23 Encounter for immunization: Secondary | ICD-10-CM | POA: Diagnosis not present

## 2019-04-25 DIAGNOSIS — D689 Coagulation defect, unspecified: Secondary | ICD-10-CM | POA: Diagnosis not present

## 2019-04-27 DIAGNOSIS — Z992 Dependence on renal dialysis: Secondary | ICD-10-CM | POA: Diagnosis not present

## 2019-04-27 DIAGNOSIS — D689 Coagulation defect, unspecified: Secondary | ICD-10-CM | POA: Diagnosis not present

## 2019-04-27 DIAGNOSIS — E1129 Type 2 diabetes mellitus with other diabetic kidney complication: Secondary | ICD-10-CM | POA: Diagnosis not present

## 2019-04-27 DIAGNOSIS — N186 End stage renal disease: Secondary | ICD-10-CM | POA: Diagnosis not present

## 2019-04-27 DIAGNOSIS — N2581 Secondary hyperparathyroidism of renal origin: Secondary | ICD-10-CM | POA: Diagnosis not present

## 2019-04-27 DIAGNOSIS — Z23 Encounter for immunization: Secondary | ICD-10-CM | POA: Diagnosis not present

## 2019-04-27 DIAGNOSIS — D631 Anemia in chronic kidney disease: Secondary | ICD-10-CM | POA: Diagnosis not present

## 2019-04-29 DIAGNOSIS — E1129 Type 2 diabetes mellitus with other diabetic kidney complication: Secondary | ICD-10-CM | POA: Diagnosis not present

## 2019-04-29 DIAGNOSIS — D631 Anemia in chronic kidney disease: Secondary | ICD-10-CM | POA: Diagnosis not present

## 2019-04-29 DIAGNOSIS — Z992 Dependence on renal dialysis: Secondary | ICD-10-CM | POA: Diagnosis not present

## 2019-04-29 DIAGNOSIS — Z23 Encounter for immunization: Secondary | ICD-10-CM | POA: Diagnosis not present

## 2019-04-29 DIAGNOSIS — N186 End stage renal disease: Secondary | ICD-10-CM | POA: Diagnosis not present

## 2019-04-29 DIAGNOSIS — N2581 Secondary hyperparathyroidism of renal origin: Secondary | ICD-10-CM | POA: Diagnosis not present

## 2019-04-29 DIAGNOSIS — D689 Coagulation defect, unspecified: Secondary | ICD-10-CM | POA: Diagnosis not present

## 2019-05-02 ENCOUNTER — Other Ambulatory Visit: Payer: Self-pay | Admitting: Family Medicine

## 2019-05-02 DIAGNOSIS — D689 Coagulation defect, unspecified: Secondary | ICD-10-CM | POA: Diagnosis not present

## 2019-05-02 DIAGNOSIS — N186 End stage renal disease: Secondary | ICD-10-CM | POA: Diagnosis not present

## 2019-05-02 DIAGNOSIS — Z992 Dependence on renal dialysis: Secondary | ICD-10-CM | POA: Diagnosis not present

## 2019-05-02 DIAGNOSIS — D631 Anemia in chronic kidney disease: Secondary | ICD-10-CM | POA: Diagnosis not present

## 2019-05-02 DIAGNOSIS — Z23 Encounter for immunization: Secondary | ICD-10-CM | POA: Diagnosis not present

## 2019-05-02 DIAGNOSIS — N2581 Secondary hyperparathyroidism of renal origin: Secondary | ICD-10-CM | POA: Diagnosis not present

## 2019-05-02 DIAGNOSIS — E1129 Type 2 diabetes mellitus with other diabetic kidney complication: Secondary | ICD-10-CM | POA: Diagnosis not present

## 2019-05-04 DIAGNOSIS — Z992 Dependence on renal dialysis: Secondary | ICD-10-CM | POA: Diagnosis not present

## 2019-05-04 DIAGNOSIS — Z23 Encounter for immunization: Secondary | ICD-10-CM | POA: Diagnosis not present

## 2019-05-04 DIAGNOSIS — N186 End stage renal disease: Secondary | ICD-10-CM | POA: Diagnosis not present

## 2019-05-04 DIAGNOSIS — D689 Coagulation defect, unspecified: Secondary | ICD-10-CM | POA: Diagnosis not present

## 2019-05-04 DIAGNOSIS — E1129 Type 2 diabetes mellitus with other diabetic kidney complication: Secondary | ICD-10-CM | POA: Diagnosis not present

## 2019-05-04 DIAGNOSIS — N2581 Secondary hyperparathyroidism of renal origin: Secondary | ICD-10-CM | POA: Diagnosis not present

## 2019-05-04 DIAGNOSIS — D631 Anemia in chronic kidney disease: Secondary | ICD-10-CM | POA: Diagnosis not present

## 2019-05-06 DIAGNOSIS — D631 Anemia in chronic kidney disease: Secondary | ICD-10-CM | POA: Diagnosis not present

## 2019-05-06 DIAGNOSIS — Z992 Dependence on renal dialysis: Secondary | ICD-10-CM | POA: Diagnosis not present

## 2019-05-06 DIAGNOSIS — N186 End stage renal disease: Secondary | ICD-10-CM | POA: Diagnosis not present

## 2019-05-06 DIAGNOSIS — Z23 Encounter for immunization: Secondary | ICD-10-CM | POA: Diagnosis not present

## 2019-05-06 DIAGNOSIS — N2581 Secondary hyperparathyroidism of renal origin: Secondary | ICD-10-CM | POA: Diagnosis not present

## 2019-05-06 DIAGNOSIS — D689 Coagulation defect, unspecified: Secondary | ICD-10-CM | POA: Diagnosis not present

## 2019-05-06 DIAGNOSIS — E1129 Type 2 diabetes mellitus with other diabetic kidney complication: Secondary | ICD-10-CM | POA: Diagnosis not present

## 2019-05-07 ENCOUNTER — Other Ambulatory Visit: Payer: Self-pay | Admitting: Family Medicine

## 2019-05-09 DIAGNOSIS — N186 End stage renal disease: Secondary | ICD-10-CM | POA: Diagnosis not present

## 2019-05-09 DIAGNOSIS — Z23 Encounter for immunization: Secondary | ICD-10-CM | POA: Diagnosis not present

## 2019-05-09 DIAGNOSIS — D631 Anemia in chronic kidney disease: Secondary | ICD-10-CM | POA: Diagnosis not present

## 2019-05-09 DIAGNOSIS — Z992 Dependence on renal dialysis: Secondary | ICD-10-CM | POA: Diagnosis not present

## 2019-05-09 DIAGNOSIS — N2581 Secondary hyperparathyroidism of renal origin: Secondary | ICD-10-CM | POA: Diagnosis not present

## 2019-05-09 DIAGNOSIS — E1129 Type 2 diabetes mellitus with other diabetic kidney complication: Secondary | ICD-10-CM | POA: Diagnosis not present

## 2019-05-09 DIAGNOSIS — D689 Coagulation defect, unspecified: Secondary | ICD-10-CM | POA: Diagnosis not present

## 2019-05-10 ENCOUNTER — Other Ambulatory Visit: Payer: Self-pay

## 2019-05-10 ENCOUNTER — Ambulatory Visit
Admission: RE | Admit: 2019-05-10 | Discharge: 2019-05-10 | Disposition: A | Discharge status: Home / Self Care | Payer: Medicare HMO | Source: Ambulatory Visit | Attending: Family Medicine | Admitting: Family Medicine

## 2019-05-10 DIAGNOSIS — Z1231 Encounter for screening mammogram for malignant neoplasm of breast: Secondary | ICD-10-CM | POA: Diagnosis not present

## 2019-05-10 DIAGNOSIS — S93402D Sprain of unspecified ligament of left ankle, subsequent encounter: Secondary | ICD-10-CM | POA: Diagnosis not present

## 2019-05-10 DIAGNOSIS — S82141D Displaced bicondylar fracture of right tibia, subsequent encounter for closed fracture with routine healing: Secondary | ICD-10-CM | POA: Diagnosis not present

## 2019-05-11 DIAGNOSIS — Z992 Dependence on renal dialysis: Secondary | ICD-10-CM | POA: Diagnosis not present

## 2019-05-11 DIAGNOSIS — N2581 Secondary hyperparathyroidism of renal origin: Secondary | ICD-10-CM | POA: Diagnosis not present

## 2019-05-11 DIAGNOSIS — N186 End stage renal disease: Secondary | ICD-10-CM | POA: Diagnosis not present

## 2019-05-11 DIAGNOSIS — Z23 Encounter for immunization: Secondary | ICD-10-CM | POA: Diagnosis not present

## 2019-05-11 DIAGNOSIS — D689 Coagulation defect, unspecified: Secondary | ICD-10-CM | POA: Diagnosis not present

## 2019-05-11 DIAGNOSIS — D631 Anemia in chronic kidney disease: Secondary | ICD-10-CM | POA: Diagnosis not present

## 2019-05-11 DIAGNOSIS — E1129 Type 2 diabetes mellitus with other diabetic kidney complication: Secondary | ICD-10-CM | POA: Diagnosis not present

## 2019-05-13 DIAGNOSIS — Z23 Encounter for immunization: Secondary | ICD-10-CM | POA: Diagnosis not present

## 2019-05-13 DIAGNOSIS — N2581 Secondary hyperparathyroidism of renal origin: Secondary | ICD-10-CM | POA: Diagnosis not present

## 2019-05-13 DIAGNOSIS — E1129 Type 2 diabetes mellitus with other diabetic kidney complication: Secondary | ICD-10-CM | POA: Diagnosis not present

## 2019-05-13 DIAGNOSIS — D631 Anemia in chronic kidney disease: Secondary | ICD-10-CM | POA: Diagnosis not present

## 2019-05-13 DIAGNOSIS — Z992 Dependence on renal dialysis: Secondary | ICD-10-CM | POA: Diagnosis not present

## 2019-05-13 DIAGNOSIS — N186 End stage renal disease: Secondary | ICD-10-CM | POA: Diagnosis not present

## 2019-05-13 DIAGNOSIS — D689 Coagulation defect, unspecified: Secondary | ICD-10-CM | POA: Diagnosis not present

## 2019-05-16 DIAGNOSIS — Z992 Dependence on renal dialysis: Secondary | ICD-10-CM | POA: Diagnosis not present

## 2019-05-16 DIAGNOSIS — E1129 Type 2 diabetes mellitus with other diabetic kidney complication: Secondary | ICD-10-CM | POA: Diagnosis not present

## 2019-05-16 DIAGNOSIS — Z23 Encounter for immunization: Secondary | ICD-10-CM | POA: Diagnosis not present

## 2019-05-16 DIAGNOSIS — D689 Coagulation defect, unspecified: Secondary | ICD-10-CM | POA: Diagnosis not present

## 2019-05-16 DIAGNOSIS — N2581 Secondary hyperparathyroidism of renal origin: Secondary | ICD-10-CM | POA: Diagnosis not present

## 2019-05-16 DIAGNOSIS — N186 End stage renal disease: Secondary | ICD-10-CM | POA: Diagnosis not present

## 2019-05-16 DIAGNOSIS — D631 Anemia in chronic kidney disease: Secondary | ICD-10-CM | POA: Diagnosis not present

## 2019-05-18 DIAGNOSIS — Z23 Encounter for immunization: Secondary | ICD-10-CM | POA: Diagnosis not present

## 2019-05-18 DIAGNOSIS — Z992 Dependence on renal dialysis: Secondary | ICD-10-CM | POA: Diagnosis not present

## 2019-05-18 DIAGNOSIS — N2581 Secondary hyperparathyroidism of renal origin: Secondary | ICD-10-CM | POA: Diagnosis not present

## 2019-05-18 DIAGNOSIS — D631 Anemia in chronic kidney disease: Secondary | ICD-10-CM | POA: Diagnosis not present

## 2019-05-18 DIAGNOSIS — E1129 Type 2 diabetes mellitus with other diabetic kidney complication: Secondary | ICD-10-CM | POA: Diagnosis not present

## 2019-05-18 DIAGNOSIS — N186 End stage renal disease: Secondary | ICD-10-CM | POA: Diagnosis not present

## 2019-05-18 DIAGNOSIS — D689 Coagulation defect, unspecified: Secondary | ICD-10-CM | POA: Diagnosis not present

## 2019-05-20 DIAGNOSIS — D689 Coagulation defect, unspecified: Secondary | ICD-10-CM | POA: Diagnosis not present

## 2019-05-20 DIAGNOSIS — Z23 Encounter for immunization: Secondary | ICD-10-CM | POA: Diagnosis not present

## 2019-05-20 DIAGNOSIS — Z992 Dependence on renal dialysis: Secondary | ICD-10-CM | POA: Diagnosis not present

## 2019-05-20 DIAGNOSIS — N2581 Secondary hyperparathyroidism of renal origin: Secondary | ICD-10-CM | POA: Diagnosis not present

## 2019-05-20 DIAGNOSIS — N186 End stage renal disease: Secondary | ICD-10-CM | POA: Diagnosis not present

## 2019-05-20 DIAGNOSIS — D631 Anemia in chronic kidney disease: Secondary | ICD-10-CM | POA: Diagnosis not present

## 2019-05-20 DIAGNOSIS — E1129 Type 2 diabetes mellitus with other diabetic kidney complication: Secondary | ICD-10-CM | POA: Diagnosis not present

## 2019-05-22 DIAGNOSIS — Z992 Dependence on renal dialysis: Secondary | ICD-10-CM | POA: Diagnosis not present

## 2019-05-22 DIAGNOSIS — I150 Renovascular hypertension: Secondary | ICD-10-CM | POA: Diagnosis not present

## 2019-05-22 DIAGNOSIS — N186 End stage renal disease: Secondary | ICD-10-CM | POA: Diagnosis not present

## 2019-05-23 DIAGNOSIS — D509 Iron deficiency anemia, unspecified: Secondary | ICD-10-CM | POA: Diagnosis not present

## 2019-05-23 DIAGNOSIS — D631 Anemia in chronic kidney disease: Secondary | ICD-10-CM | POA: Diagnosis not present

## 2019-05-23 DIAGNOSIS — N186 End stage renal disease: Secondary | ICD-10-CM | POA: Diagnosis not present

## 2019-05-23 DIAGNOSIS — L299 Pruritus, unspecified: Secondary | ICD-10-CM | POA: Diagnosis not present

## 2019-05-23 DIAGNOSIS — N2581 Secondary hyperparathyroidism of renal origin: Secondary | ICD-10-CM | POA: Diagnosis not present

## 2019-05-23 DIAGNOSIS — Z992 Dependence on renal dialysis: Secondary | ICD-10-CM | POA: Diagnosis not present

## 2019-05-23 DIAGNOSIS — D689 Coagulation defect, unspecified: Secondary | ICD-10-CM | POA: Diagnosis not present

## 2019-05-23 DIAGNOSIS — E1129 Type 2 diabetes mellitus with other diabetic kidney complication: Secondary | ICD-10-CM | POA: Diagnosis not present

## 2019-05-25 DIAGNOSIS — N2581 Secondary hyperparathyroidism of renal origin: Secondary | ICD-10-CM | POA: Diagnosis not present

## 2019-05-25 DIAGNOSIS — Z992 Dependence on renal dialysis: Secondary | ICD-10-CM | POA: Diagnosis not present

## 2019-05-25 DIAGNOSIS — L299 Pruritus, unspecified: Secondary | ICD-10-CM | POA: Diagnosis not present

## 2019-05-25 DIAGNOSIS — D509 Iron deficiency anemia, unspecified: Secondary | ICD-10-CM | POA: Diagnosis not present

## 2019-05-25 DIAGNOSIS — D689 Coagulation defect, unspecified: Secondary | ICD-10-CM | POA: Diagnosis not present

## 2019-05-25 DIAGNOSIS — E1129 Type 2 diabetes mellitus with other diabetic kidney complication: Secondary | ICD-10-CM | POA: Diagnosis not present

## 2019-05-25 DIAGNOSIS — D631 Anemia in chronic kidney disease: Secondary | ICD-10-CM | POA: Diagnosis not present

## 2019-05-25 DIAGNOSIS — N186 End stage renal disease: Secondary | ICD-10-CM | POA: Diagnosis not present

## 2019-05-27 ENCOUNTER — Other Ambulatory Visit: Payer: Self-pay | Admitting: Family Medicine

## 2019-05-27 DIAGNOSIS — L299 Pruritus, unspecified: Secondary | ICD-10-CM | POA: Diagnosis not present

## 2019-05-27 DIAGNOSIS — D689 Coagulation defect, unspecified: Secondary | ICD-10-CM | POA: Diagnosis not present

## 2019-05-27 DIAGNOSIS — Z992 Dependence on renal dialysis: Secondary | ICD-10-CM | POA: Diagnosis not present

## 2019-05-27 DIAGNOSIS — N186 End stage renal disease: Secondary | ICD-10-CM | POA: Diagnosis not present

## 2019-05-27 DIAGNOSIS — E1129 Type 2 diabetes mellitus with other diabetic kidney complication: Secondary | ICD-10-CM | POA: Diagnosis not present

## 2019-05-27 DIAGNOSIS — D509 Iron deficiency anemia, unspecified: Secondary | ICD-10-CM | POA: Diagnosis not present

## 2019-05-27 DIAGNOSIS — N2581 Secondary hyperparathyroidism of renal origin: Secondary | ICD-10-CM | POA: Diagnosis not present

## 2019-05-27 DIAGNOSIS — D631 Anemia in chronic kidney disease: Secondary | ICD-10-CM | POA: Diagnosis not present

## 2019-05-30 DIAGNOSIS — L299 Pruritus, unspecified: Secondary | ICD-10-CM | POA: Diagnosis not present

## 2019-05-30 DIAGNOSIS — N186 End stage renal disease: Secondary | ICD-10-CM | POA: Diagnosis not present

## 2019-05-30 DIAGNOSIS — D509 Iron deficiency anemia, unspecified: Secondary | ICD-10-CM | POA: Diagnosis not present

## 2019-05-30 DIAGNOSIS — Z992 Dependence on renal dialysis: Secondary | ICD-10-CM | POA: Diagnosis not present

## 2019-05-30 DIAGNOSIS — D631 Anemia in chronic kidney disease: Secondary | ICD-10-CM | POA: Diagnosis not present

## 2019-05-30 DIAGNOSIS — D689 Coagulation defect, unspecified: Secondary | ICD-10-CM | POA: Diagnosis not present

## 2019-05-30 DIAGNOSIS — E1129 Type 2 diabetes mellitus with other diabetic kidney complication: Secondary | ICD-10-CM | POA: Diagnosis not present

## 2019-05-30 DIAGNOSIS — N2581 Secondary hyperparathyroidism of renal origin: Secondary | ICD-10-CM | POA: Diagnosis not present

## 2019-06-01 DIAGNOSIS — N2581 Secondary hyperparathyroidism of renal origin: Secondary | ICD-10-CM | POA: Diagnosis not present

## 2019-06-01 DIAGNOSIS — E1129 Type 2 diabetes mellitus with other diabetic kidney complication: Secondary | ICD-10-CM | POA: Diagnosis not present

## 2019-06-01 DIAGNOSIS — D689 Coagulation defect, unspecified: Secondary | ICD-10-CM | POA: Diagnosis not present

## 2019-06-01 DIAGNOSIS — N186 End stage renal disease: Secondary | ICD-10-CM | POA: Diagnosis not present

## 2019-06-01 DIAGNOSIS — D631 Anemia in chronic kidney disease: Secondary | ICD-10-CM | POA: Diagnosis not present

## 2019-06-01 DIAGNOSIS — D509 Iron deficiency anemia, unspecified: Secondary | ICD-10-CM | POA: Diagnosis not present

## 2019-06-01 DIAGNOSIS — L299 Pruritus, unspecified: Secondary | ICD-10-CM | POA: Diagnosis not present

## 2019-06-01 DIAGNOSIS — Z992 Dependence on renal dialysis: Secondary | ICD-10-CM | POA: Diagnosis not present

## 2019-06-03 DIAGNOSIS — D689 Coagulation defect, unspecified: Secondary | ICD-10-CM | POA: Diagnosis not present

## 2019-06-03 DIAGNOSIS — E1129 Type 2 diabetes mellitus with other diabetic kidney complication: Secondary | ICD-10-CM | POA: Diagnosis not present

## 2019-06-03 DIAGNOSIS — Z992 Dependence on renal dialysis: Secondary | ICD-10-CM | POA: Diagnosis not present

## 2019-06-03 DIAGNOSIS — N2581 Secondary hyperparathyroidism of renal origin: Secondary | ICD-10-CM | POA: Diagnosis not present

## 2019-06-03 DIAGNOSIS — D509 Iron deficiency anemia, unspecified: Secondary | ICD-10-CM | POA: Diagnosis not present

## 2019-06-03 DIAGNOSIS — L299 Pruritus, unspecified: Secondary | ICD-10-CM | POA: Diagnosis not present

## 2019-06-03 DIAGNOSIS — N186 End stage renal disease: Secondary | ICD-10-CM | POA: Diagnosis not present

## 2019-06-03 DIAGNOSIS — D631 Anemia in chronic kidney disease: Secondary | ICD-10-CM | POA: Diagnosis not present

## 2019-06-06 DIAGNOSIS — Z992 Dependence on renal dialysis: Secondary | ICD-10-CM | POA: Diagnosis not present

## 2019-06-06 DIAGNOSIS — D689 Coagulation defect, unspecified: Secondary | ICD-10-CM | POA: Diagnosis not present

## 2019-06-06 DIAGNOSIS — D509 Iron deficiency anemia, unspecified: Secondary | ICD-10-CM | POA: Diagnosis not present

## 2019-06-06 DIAGNOSIS — D631 Anemia in chronic kidney disease: Secondary | ICD-10-CM | POA: Diagnosis not present

## 2019-06-06 DIAGNOSIS — N186 End stage renal disease: Secondary | ICD-10-CM | POA: Diagnosis not present

## 2019-06-06 DIAGNOSIS — N2581 Secondary hyperparathyroidism of renal origin: Secondary | ICD-10-CM | POA: Diagnosis not present

## 2019-06-06 DIAGNOSIS — E1129 Type 2 diabetes mellitus with other diabetic kidney complication: Secondary | ICD-10-CM | POA: Diagnosis not present

## 2019-06-06 DIAGNOSIS — L299 Pruritus, unspecified: Secondary | ICD-10-CM | POA: Diagnosis not present

## 2019-06-08 DIAGNOSIS — D631 Anemia in chronic kidney disease: Secondary | ICD-10-CM | POA: Diagnosis not present

## 2019-06-08 DIAGNOSIS — D509 Iron deficiency anemia, unspecified: Secondary | ICD-10-CM | POA: Diagnosis not present

## 2019-06-08 DIAGNOSIS — N2581 Secondary hyperparathyroidism of renal origin: Secondary | ICD-10-CM | POA: Diagnosis not present

## 2019-06-08 DIAGNOSIS — D689 Coagulation defect, unspecified: Secondary | ICD-10-CM | POA: Diagnosis not present

## 2019-06-08 DIAGNOSIS — L299 Pruritus, unspecified: Secondary | ICD-10-CM | POA: Diagnosis not present

## 2019-06-08 DIAGNOSIS — N186 End stage renal disease: Secondary | ICD-10-CM | POA: Diagnosis not present

## 2019-06-08 DIAGNOSIS — E1129 Type 2 diabetes mellitus with other diabetic kidney complication: Secondary | ICD-10-CM | POA: Diagnosis not present

## 2019-06-08 DIAGNOSIS — Z992 Dependence on renal dialysis: Secondary | ICD-10-CM | POA: Diagnosis not present

## 2019-06-10 DIAGNOSIS — D509 Iron deficiency anemia, unspecified: Secondary | ICD-10-CM | POA: Diagnosis not present

## 2019-06-10 DIAGNOSIS — E1129 Type 2 diabetes mellitus with other diabetic kidney complication: Secondary | ICD-10-CM | POA: Diagnosis not present

## 2019-06-10 DIAGNOSIS — L299 Pruritus, unspecified: Secondary | ICD-10-CM | POA: Diagnosis not present

## 2019-06-10 DIAGNOSIS — D631 Anemia in chronic kidney disease: Secondary | ICD-10-CM | POA: Diagnosis not present

## 2019-06-10 DIAGNOSIS — D689 Coagulation defect, unspecified: Secondary | ICD-10-CM | POA: Diagnosis not present

## 2019-06-10 DIAGNOSIS — N186 End stage renal disease: Secondary | ICD-10-CM | POA: Diagnosis not present

## 2019-06-10 DIAGNOSIS — N2581 Secondary hyperparathyroidism of renal origin: Secondary | ICD-10-CM | POA: Diagnosis not present

## 2019-06-10 DIAGNOSIS — Z992 Dependence on renal dialysis: Secondary | ICD-10-CM | POA: Diagnosis not present

## 2019-06-13 DIAGNOSIS — N186 End stage renal disease: Secondary | ICD-10-CM | POA: Diagnosis not present

## 2019-06-13 DIAGNOSIS — N2581 Secondary hyperparathyroidism of renal origin: Secondary | ICD-10-CM | POA: Diagnosis not present

## 2019-06-13 DIAGNOSIS — D631 Anemia in chronic kidney disease: Secondary | ICD-10-CM | POA: Diagnosis not present

## 2019-06-13 DIAGNOSIS — D689 Coagulation defect, unspecified: Secondary | ICD-10-CM | POA: Diagnosis not present

## 2019-06-13 DIAGNOSIS — D509 Iron deficiency anemia, unspecified: Secondary | ICD-10-CM | POA: Diagnosis not present

## 2019-06-13 DIAGNOSIS — Z992 Dependence on renal dialysis: Secondary | ICD-10-CM | POA: Diagnosis not present

## 2019-06-13 DIAGNOSIS — L299 Pruritus, unspecified: Secondary | ICD-10-CM | POA: Diagnosis not present

## 2019-06-13 DIAGNOSIS — E1129 Type 2 diabetes mellitus with other diabetic kidney complication: Secondary | ICD-10-CM | POA: Diagnosis not present

## 2019-06-15 DIAGNOSIS — L299 Pruritus, unspecified: Secondary | ICD-10-CM | POA: Diagnosis not present

## 2019-06-15 DIAGNOSIS — N2581 Secondary hyperparathyroidism of renal origin: Secondary | ICD-10-CM | POA: Diagnosis not present

## 2019-06-15 DIAGNOSIS — E1129 Type 2 diabetes mellitus with other diabetic kidney complication: Secondary | ICD-10-CM | POA: Diagnosis not present

## 2019-06-15 DIAGNOSIS — D689 Coagulation defect, unspecified: Secondary | ICD-10-CM | POA: Diagnosis not present

## 2019-06-15 DIAGNOSIS — N186 End stage renal disease: Secondary | ICD-10-CM | POA: Diagnosis not present

## 2019-06-15 DIAGNOSIS — D631 Anemia in chronic kidney disease: Secondary | ICD-10-CM | POA: Diagnosis not present

## 2019-06-15 DIAGNOSIS — D509 Iron deficiency anemia, unspecified: Secondary | ICD-10-CM | POA: Diagnosis not present

## 2019-06-15 DIAGNOSIS — Z992 Dependence on renal dialysis: Secondary | ICD-10-CM | POA: Diagnosis not present

## 2019-06-17 DIAGNOSIS — D631 Anemia in chronic kidney disease: Secondary | ICD-10-CM | POA: Diagnosis not present

## 2019-06-17 DIAGNOSIS — E1129 Type 2 diabetes mellitus with other diabetic kidney complication: Secondary | ICD-10-CM | POA: Diagnosis not present

## 2019-06-17 DIAGNOSIS — L299 Pruritus, unspecified: Secondary | ICD-10-CM | POA: Diagnosis not present

## 2019-06-17 DIAGNOSIS — D689 Coagulation defect, unspecified: Secondary | ICD-10-CM | POA: Diagnosis not present

## 2019-06-17 DIAGNOSIS — N186 End stage renal disease: Secondary | ICD-10-CM | POA: Diagnosis not present

## 2019-06-17 DIAGNOSIS — D509 Iron deficiency anemia, unspecified: Secondary | ICD-10-CM | POA: Diagnosis not present

## 2019-06-17 DIAGNOSIS — N2581 Secondary hyperparathyroidism of renal origin: Secondary | ICD-10-CM | POA: Diagnosis not present

## 2019-06-17 DIAGNOSIS — Z992 Dependence on renal dialysis: Secondary | ICD-10-CM | POA: Diagnosis not present

## 2019-06-20 DIAGNOSIS — L299 Pruritus, unspecified: Secondary | ICD-10-CM | POA: Diagnosis not present

## 2019-06-20 DIAGNOSIS — D509 Iron deficiency anemia, unspecified: Secondary | ICD-10-CM | POA: Diagnosis not present

## 2019-06-20 DIAGNOSIS — N186 End stage renal disease: Secondary | ICD-10-CM | POA: Diagnosis not present

## 2019-06-20 DIAGNOSIS — D631 Anemia in chronic kidney disease: Secondary | ICD-10-CM | POA: Diagnosis not present

## 2019-06-20 DIAGNOSIS — D689 Coagulation defect, unspecified: Secondary | ICD-10-CM | POA: Diagnosis not present

## 2019-06-20 DIAGNOSIS — E1129 Type 2 diabetes mellitus with other diabetic kidney complication: Secondary | ICD-10-CM | POA: Diagnosis not present

## 2019-06-20 DIAGNOSIS — N2581 Secondary hyperparathyroidism of renal origin: Secondary | ICD-10-CM | POA: Diagnosis not present

## 2019-06-20 DIAGNOSIS — Z992 Dependence on renal dialysis: Secondary | ICD-10-CM | POA: Diagnosis not present

## 2019-06-22 DIAGNOSIS — Z992 Dependence on renal dialysis: Secondary | ICD-10-CM | POA: Diagnosis not present

## 2019-06-22 DIAGNOSIS — E1129 Type 2 diabetes mellitus with other diabetic kidney complication: Secondary | ICD-10-CM | POA: Diagnosis not present

## 2019-06-22 DIAGNOSIS — D689 Coagulation defect, unspecified: Secondary | ICD-10-CM | POA: Diagnosis not present

## 2019-06-22 DIAGNOSIS — I150 Renovascular hypertension: Secondary | ICD-10-CM | POA: Diagnosis not present

## 2019-06-22 DIAGNOSIS — N2581 Secondary hyperparathyroidism of renal origin: Secondary | ICD-10-CM | POA: Diagnosis not present

## 2019-06-22 DIAGNOSIS — D509 Iron deficiency anemia, unspecified: Secondary | ICD-10-CM | POA: Diagnosis not present

## 2019-06-22 DIAGNOSIS — D631 Anemia in chronic kidney disease: Secondary | ICD-10-CM | POA: Diagnosis not present

## 2019-06-22 DIAGNOSIS — L299 Pruritus, unspecified: Secondary | ICD-10-CM | POA: Diagnosis not present

## 2019-06-22 DIAGNOSIS — N186 End stage renal disease: Secondary | ICD-10-CM | POA: Diagnosis not present

## 2019-06-24 DIAGNOSIS — N186 End stage renal disease: Secondary | ICD-10-CM | POA: Diagnosis not present

## 2019-06-24 DIAGNOSIS — D689 Coagulation defect, unspecified: Secondary | ICD-10-CM | POA: Diagnosis not present

## 2019-06-24 DIAGNOSIS — D631 Anemia in chronic kidney disease: Secondary | ICD-10-CM | POA: Diagnosis not present

## 2019-06-24 DIAGNOSIS — Z992 Dependence on renal dialysis: Secondary | ICD-10-CM | POA: Diagnosis not present

## 2019-06-24 DIAGNOSIS — L299 Pruritus, unspecified: Secondary | ICD-10-CM | POA: Diagnosis not present

## 2019-06-24 DIAGNOSIS — D509 Iron deficiency anemia, unspecified: Secondary | ICD-10-CM | POA: Diagnosis not present

## 2019-06-24 DIAGNOSIS — E1129 Type 2 diabetes mellitus with other diabetic kidney complication: Secondary | ICD-10-CM | POA: Diagnosis not present

## 2019-06-24 DIAGNOSIS — N2581 Secondary hyperparathyroidism of renal origin: Secondary | ICD-10-CM | POA: Diagnosis not present

## 2019-06-27 DIAGNOSIS — D509 Iron deficiency anemia, unspecified: Secondary | ICD-10-CM | POA: Diagnosis not present

## 2019-06-27 DIAGNOSIS — E1129 Type 2 diabetes mellitus with other diabetic kidney complication: Secondary | ICD-10-CM | POA: Diagnosis not present

## 2019-06-27 DIAGNOSIS — D631 Anemia in chronic kidney disease: Secondary | ICD-10-CM | POA: Diagnosis not present

## 2019-06-27 DIAGNOSIS — L299 Pruritus, unspecified: Secondary | ICD-10-CM | POA: Diagnosis not present

## 2019-06-27 DIAGNOSIS — N186 End stage renal disease: Secondary | ICD-10-CM | POA: Diagnosis not present

## 2019-06-27 DIAGNOSIS — D689 Coagulation defect, unspecified: Secondary | ICD-10-CM | POA: Diagnosis not present

## 2019-06-27 DIAGNOSIS — Z992 Dependence on renal dialysis: Secondary | ICD-10-CM | POA: Diagnosis not present

## 2019-06-27 DIAGNOSIS — N2581 Secondary hyperparathyroidism of renal origin: Secondary | ICD-10-CM | POA: Diagnosis not present

## 2019-06-29 DIAGNOSIS — N186 End stage renal disease: Secondary | ICD-10-CM | POA: Diagnosis not present

## 2019-06-29 DIAGNOSIS — D631 Anemia in chronic kidney disease: Secondary | ICD-10-CM | POA: Diagnosis not present

## 2019-06-29 DIAGNOSIS — D509 Iron deficiency anemia, unspecified: Secondary | ICD-10-CM | POA: Diagnosis not present

## 2019-06-29 DIAGNOSIS — D689 Coagulation defect, unspecified: Secondary | ICD-10-CM | POA: Diagnosis not present

## 2019-06-29 DIAGNOSIS — E1129 Type 2 diabetes mellitus with other diabetic kidney complication: Secondary | ICD-10-CM | POA: Diagnosis not present

## 2019-06-29 DIAGNOSIS — L299 Pruritus, unspecified: Secondary | ICD-10-CM | POA: Diagnosis not present

## 2019-06-29 DIAGNOSIS — Z992 Dependence on renal dialysis: Secondary | ICD-10-CM | POA: Diagnosis not present

## 2019-06-29 DIAGNOSIS — N2581 Secondary hyperparathyroidism of renal origin: Secondary | ICD-10-CM | POA: Diagnosis not present

## 2019-07-01 DIAGNOSIS — Z992 Dependence on renal dialysis: Secondary | ICD-10-CM | POA: Diagnosis not present

## 2019-07-01 DIAGNOSIS — N2581 Secondary hyperparathyroidism of renal origin: Secondary | ICD-10-CM | POA: Diagnosis not present

## 2019-07-01 DIAGNOSIS — D689 Coagulation defect, unspecified: Secondary | ICD-10-CM | POA: Diagnosis not present

## 2019-07-01 DIAGNOSIS — L299 Pruritus, unspecified: Secondary | ICD-10-CM | POA: Diagnosis not present

## 2019-07-01 DIAGNOSIS — N186 End stage renal disease: Secondary | ICD-10-CM | POA: Diagnosis not present

## 2019-07-01 DIAGNOSIS — E1129 Type 2 diabetes mellitus with other diabetic kidney complication: Secondary | ICD-10-CM | POA: Diagnosis not present

## 2019-07-01 DIAGNOSIS — D509 Iron deficiency anemia, unspecified: Secondary | ICD-10-CM | POA: Diagnosis not present

## 2019-07-01 DIAGNOSIS — D631 Anemia in chronic kidney disease: Secondary | ICD-10-CM | POA: Diagnosis not present

## 2019-07-04 DIAGNOSIS — N2581 Secondary hyperparathyroidism of renal origin: Secondary | ICD-10-CM | POA: Diagnosis not present

## 2019-07-04 DIAGNOSIS — D631 Anemia in chronic kidney disease: Secondary | ICD-10-CM | POA: Diagnosis not present

## 2019-07-04 DIAGNOSIS — L299 Pruritus, unspecified: Secondary | ICD-10-CM | POA: Diagnosis not present

## 2019-07-04 DIAGNOSIS — E1129 Type 2 diabetes mellitus with other diabetic kidney complication: Secondary | ICD-10-CM | POA: Diagnosis not present

## 2019-07-04 DIAGNOSIS — Z992 Dependence on renal dialysis: Secondary | ICD-10-CM | POA: Diagnosis not present

## 2019-07-04 DIAGNOSIS — N186 End stage renal disease: Secondary | ICD-10-CM | POA: Diagnosis not present

## 2019-07-04 DIAGNOSIS — D689 Coagulation defect, unspecified: Secondary | ICD-10-CM | POA: Diagnosis not present

## 2019-07-04 DIAGNOSIS — D509 Iron deficiency anemia, unspecified: Secondary | ICD-10-CM | POA: Diagnosis not present

## 2019-07-05 ENCOUNTER — Ambulatory Visit (INDEPENDENT_AMBULATORY_CARE_PROVIDER_SITE_OTHER): Payer: Medicare HMO | Admitting: Cardiovascular Disease

## 2019-07-05 ENCOUNTER — Other Ambulatory Visit: Payer: Self-pay

## 2019-07-05 ENCOUNTER — Telehealth: Payer: Self-pay | Admitting: Family Medicine

## 2019-07-05 ENCOUNTER — Encounter: Payer: Self-pay | Admitting: Cardiovascular Disease

## 2019-07-05 VITALS — BP 140/80 | HR 85 | Ht 66.0 in | Wt 184.8 lb

## 2019-07-05 DIAGNOSIS — I10 Essential (primary) hypertension: Secondary | ICD-10-CM

## 2019-07-05 DIAGNOSIS — N186 End stage renal disease: Secondary | ICD-10-CM | POA: Diagnosis not present

## 2019-07-05 DIAGNOSIS — E785 Hyperlipidemia, unspecified: Secondary | ICD-10-CM | POA: Diagnosis not present

## 2019-07-05 DIAGNOSIS — Z992 Dependence on renal dialysis: Secondary | ICD-10-CM | POA: Diagnosis not present

## 2019-07-05 LAB — BASIC METABOLIC PANEL
BUN/Creatinine Ratio: 3 — ABNORMAL LOW (ref 12–28)
BUN: 22 mg/dL (ref 8–27)
CO2: 30 mmol/L — ABNORMAL HIGH (ref 20–29)
Calcium: 9.3 mg/dL (ref 8.7–10.3)
Chloride: 95 mmol/L — ABNORMAL LOW (ref 96–106)
Creatinine, Ser: 6.37 mg/dL — ABNORMAL HIGH (ref 0.57–1.00)
GFR calc Af Amer: 7 mL/min/{1.73_m2} — ABNORMAL LOW (ref >59)
GFR calc non Af Amer: 6 mL/min/{1.73_m2} — ABNORMAL LOW (ref >59)
Glucose: 128 mg/dL — ABNORMAL HIGH (ref 65–99)
Potassium: 4.8 mmol/L (ref 3.5–5.2)
Sodium: 139 mmol/L (ref 134–144)

## 2019-07-05 LAB — LIPID PANEL
Chol/HDL Ratio: 5.1 ratio — ABNORMAL HIGH (ref 0.0–4.4)
Cholesterol, Total: 204 mg/dL — ABNORMAL HIGH (ref 100–199)
HDL: 40 mg/dL (ref >39)
LDL Chol Calc (NIH): 135 mg/dL — ABNORMAL HIGH (ref 0–99)
Triglycerides: 160 mg/dL — ABNORMAL HIGH (ref 0–149)
VLDL Cholesterol Cal: 29 mg/dL (ref 5–40)

## 2019-07-05 LAB — HEPATIC FUNCTION PANEL
ALT: 14 IU/L (ref 0–32)
AST: 16 IU/L (ref 0–40)
Albumin: 4.4 g/dL (ref 3.8–4.8)
Alkaline Phosphatase: 123 IU/L — ABNORMAL HIGH (ref 39–117)
Bilirubin Total: 0.3 mg/dL (ref 0.0–1.2)
Bilirubin, Direct: 0.12 mg/dL (ref 0.00–0.40)
Total Protein: 7.2 g/dL (ref 6.0–8.5)
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
## 2019-07-05 NOTE — Patient Instructions (Signed)
Medication Instructions:  Your physician recommends that you continue on your current medications as directed. Please refer to the Current Medication list given to you today.  *If you need a refill on your cardiac medications before your next appointment, please call your pharmacy*   Lab Work: TODAY - cholesterol, liver panel, basic metabolic panel If you have labs (blood work) drawn today and your tests are completely normal, you will receive your results only by: [name] MyChart Message (if you have MyChart) OR . A paper copy in the mail If you have any lab test that is abnormal or we need to change your treatment, we will call you to review the results.    Testing/Procedures: None Ordered   Follow-Up: At [facility], you and your health needs are our priority.  As part of our continuing mission to provide you with exceptional heart care, we have created designated Provider Care Teams.  These Care Teams include your primary Cardiologist (physician) and Advanced Practice Providers (APPs -  Physician Assistants and Nurse Practitioners) who all work together to provide you with the care you need, when you need it.  We recommend signing up for the patient portal called "MyChart".  Sign up information is provided on this After Visit Summary.  MyChart is used to connect with patients for Virtual Visits (Telemedicine).  Patients are able to view lab/test results, encounter notes, upcoming appointments, etc.  Non-urgent messages can be sent to your provider as well.   To learn more about what you can do with MyChart, go to [email or web address].    Your next appointment:   1 year(s)  The format for your next appointment:   In Person  Provider:   You may see Mertie Moores, MD or one of the following Advanced Practice Providers on your designated Care Team:    Richardson Dopp, PA-C  [place], [place]  Daune Perch, [place]

## 2019-07-05 NOTE — Progress Notes (Signed)
  Chronic Care Management   Outreach Note  07/05/2019 Name: TANGELA DOLLIVER MRN: [id number] DOB: 1955-08-22  Referred by: Midge Minium, MD Reason for referral : No chief complaint on file.   An unsuccessful telephone outreach was attempted today. The patient was referred to the pharmacist for assistance with care management and care coordination.   Follow Up Plan:   Earney Hamburg Upstream Scheduler

## 2019-07-06 ENCOUNTER — Telehealth: Payer: Self-pay

## 2019-07-06 DIAGNOSIS — D509 Iron deficiency anemia, unspecified: Secondary | ICD-10-CM | POA: Diagnosis not present

## 2019-07-06 DIAGNOSIS — D689 Coagulation defect, unspecified: Secondary | ICD-10-CM | POA: Diagnosis not present

## 2019-07-06 DIAGNOSIS — Z79899 Other long term (current) drug therapy: Secondary | ICD-10-CM

## 2019-07-06 DIAGNOSIS — E785 Hyperlipidemia, unspecified: Secondary | ICD-10-CM

## 2019-07-06 DIAGNOSIS — N186 End stage renal disease: Secondary | ICD-10-CM | POA: Diagnosis not present

## 2019-07-06 DIAGNOSIS — D631 Anemia in chronic kidney disease: Secondary | ICD-10-CM | POA: Diagnosis not present

## 2019-07-06 DIAGNOSIS — I10 Essential (primary) hypertension: Secondary | ICD-10-CM

## 2019-07-06 DIAGNOSIS — N2581 Secondary hyperparathyroidism of renal origin: Secondary | ICD-10-CM | POA: Diagnosis not present

## 2019-07-06 DIAGNOSIS — Z992 Dependence on renal dialysis: Secondary | ICD-10-CM | POA: Diagnosis not present

## 2019-07-06 DIAGNOSIS — E1129 Type 2 diabetes mellitus with other diabetic kidney complication: Secondary | ICD-10-CM | POA: Diagnosis not present

## 2019-07-06 DIAGNOSIS — L299 Pruritus, unspecified: Secondary | ICD-10-CM | POA: Diagnosis not present

## 2019-07-06 MED ORDER — ATORVASTATIN CALCIUM 40 MG PO TABS: 40.0000 mg | ORAL_TABLET | Freq: Every day | ORAL | 3 refills | Status: DC

## 2019-07-06 NOTE — Telephone Encounter (Signed)
-----   Message from Thayer Headings, MD sent at 07/06/2019  3:39 PM EDT ----- LDL is elevated.    Please have her start Atorvastatin 40 mg a day  Check lipids,liver enz, bmp in 3 months

## 2019-07-06 NOTE — Telephone Encounter (Signed)
Pt verbalized understanding of her lab results and agrees to try the Atorvastatin.. will return 10/11/19 for repeat fasting labs. Pt will call back if she has any further questions.

## 2019-07-07 ENCOUNTER — Encounter: Payer: Self-pay | Admitting: Family Medicine

## 2019-07-07 ENCOUNTER — Ambulatory Visit (INDEPENDENT_AMBULATORY_CARE_PROVIDER_SITE_OTHER): Payer: Medicare HMO | Admitting: Family Medicine

## 2019-07-07 ENCOUNTER — Telehealth: Payer: Self-pay

## 2019-07-07 ENCOUNTER — Other Ambulatory Visit: Payer: Self-pay

## 2019-07-07 VITALS — BP 125/81 | HR 80 | Temp 97.9°F | Resp 16 | Ht 66.0 in | Wt 182.2 lb

## 2019-07-07 DIAGNOSIS — E1122 Type 2 diabetes mellitus with diabetic chronic kidney disease: Secondary | ICD-10-CM | POA: Diagnosis not present

## 2019-07-07 DIAGNOSIS — Z0000 Encounter for general adult medical examination without abnormal findings: Secondary | ICD-10-CM | POA: Diagnosis not present

## 2019-07-07 DIAGNOSIS — Z992 Dependence on renal dialysis: Secondary | ICD-10-CM | POA: Diagnosis not present

## 2019-07-07 DIAGNOSIS — M25512 Pain in left shoulder: Secondary | ICD-10-CM | POA: Diagnosis not present

## 2019-07-07 DIAGNOSIS — N186 End stage renal disease: Secondary | ICD-10-CM

## 2019-07-07 LAB — BASIC METABOLIC PANEL
BUN: 23 mg/dL (ref 6–23)
CO2: 33 mEq/L — ABNORMAL HIGH (ref 19–32)
Calcium: 9.5 mg/dL (ref 8.4–10.5)
Chloride: 97 mEq/L (ref 96–112)
Creatinine, Ser: 6.18 mg/dL (ref 0.40–1.20)
GFR: 8.33 mL/min — CL (ref >60.00)
Glucose, Bld: 107 mg/dL — ABNORMAL HIGH (ref 70–99)
Potassium: 3.9 mEq/L (ref 3.5–5.1)
Sodium: 139 mEq/L (ref 135–145)

## 2019-07-07 LAB — LIPID PANEL
Cholesterol: 222 mg/dL — ABNORMAL HIGH (ref 0–200)
HDL: 41.1 mg/dL (ref >39.00)
LDL Cholesterol: 151 mg/dL — ABNORMAL HIGH (ref 0–99)
NonHDL: 180.5
Total CHOL/HDL Ratio: 5
Triglycerides: 146 mg/dL (ref 0.0–149.0)
VLDL: 29.2 mg/dL (ref 0.0–40.0)

## 2019-07-07 LAB — CBC WITH DIFFERENTIAL/PLATELET
Basophils Absolute: 0.1 10*3/uL (ref 0.0–0.1)
Basophils Relative: 1 % (ref 0.0–3.0)
Eosinophils Absolute: 0.3 10*3/uL (ref 0.0–0.7)
Eosinophils Relative: 3.9 % (ref 0.0–5.0)
HCT: 31.1 % — ABNORMAL LOW (ref 36.0–46.0)
Hemoglobin: 10.4 g/dL — ABNORMAL LOW (ref 12.0–15.0)
Lymphocytes Relative: 24.6 % (ref 12.0–46.0)
Lymphs Abs: 2.1 10*3/uL (ref 0.7–4.0)
MCHC: 33.3 g/dL (ref 30.0–36.0)
MCV: 93.2 fl (ref 78.0–100.0)
Monocytes Absolute: 0.6 10*3/uL (ref 0.1–1.0)
Monocytes Relative: 6.7 % (ref 3.0–12.0)
Neutro Abs: 5.5 10*3/uL (ref 1.4–7.7)
Neutrophils Relative %: 63.8 % (ref 43.0–77.0)
Platelets: 272 10*3/uL (ref 150.0–400.0)
RBC: 3.34 Mil/uL — ABNORMAL LOW (ref 3.87–5.11)
RDW: 17.2 % — ABNORMAL HIGH (ref 11.5–15.5)
WBC: 8.7 10*3/uL (ref 4.0–10.5)

## 2019-07-07 LAB — HEPATIC FUNCTION PANEL
ALT: 14 U/L (ref 0–35)
AST: 16 U/L (ref 0–37)
Albumin: 4.4 g/dL (ref 3.5–5.2)
Alkaline Phosphatase: 115 U/L (ref 39–117)
Bilirubin, Direct: 0.1 mg/dL (ref 0.0–0.3)
Total Bilirubin: 0.5 mg/dL (ref 0.2–1.2)
Total Protein: 7.2 g/dL (ref 6.0–8.3)

## 2019-07-07 LAB — TSH: TSH: 2.21 u[IU]/mL (ref 0.35–4.50)

## 2019-07-07 LAB — HEMOGLOBIN A1C: Hgb A1c MFr Bld: 5.5 % (ref 4.6–6.5)

## 2019-07-07 NOTE — Assessment & Plan Note (Signed)
Pt's PE unchanged from previous.  UTD on mammo, eye exam.  Foot exam done today.  Due for repeat colonoscopy.  Pt to call and schedule.  Check labs.  Encouraged healthy diet and regular exercise.

## 2019-07-07 NOTE — Telephone Encounter (Signed)
Noted  

## 2019-07-07 NOTE — Telephone Encounter (Signed)
Took call from [facility] for a critical result on the patient. Creatinine - 6.18, GFR - 8.33 . Confirmed results with read back.

## 2019-07-07 NOTE — Patient Instructions (Addendum)
Follow up in 3-4 months to recheck diabetes and schedule your annual wellness visit We'll notify you of your lab results and make any changes if needed Continue to work on healthy diet and regular exercise- you're doing great! We'll call you with your Orthopedic appt Call and schedule your colonoscopy at your convenience [phone] Call with any questions or concerns Stay Safe!  Stay Healthy!

## 2019-07-07 NOTE — Telephone Encounter (Signed)
Pt is on dialysis so this is not concerning

## 2019-07-07 NOTE — Assessment & Plan Note (Signed)
Chronic problem.  Due for A1C.  Foot exam done today.  Has recently been diet controlled.  Check labs and see if meds are needed.

## 2019-07-07 NOTE — Progress Notes (Signed)
   Subjective:    Patient ID: [name] [name], female    DOB: 1955/07/14, 64 y.o.   MRN: [id number]  HPI CPE- UTD on mammo, eye exam.  Due for foot exam, repeat colonoscopy.  No need for pap due to hysterectomy.   Review of Systems Patient reports no vision/ hearing changes, adenopathy,fever, weight change,  persistant/recurrent hoarseness , swallowing issues, chest pain, palpitations, edema, persistant/recurrent cough, hemoptysis, dyspnea (rest/exertional/paroxysmal nocturnal), gastrointestinal bleeding (melena, rectal bleeding), abdominal pain, significant heartburn, bowel changes, GU symptoms (dysuria, hematuria, incontinence), Gyn symptoms (abnormal  bleeding, pain),  syncope, memory loss, skin/hair/nail changes, abnormal bruising or bleeding, anxiety, or depression.   L shoulder pain- at times will have severe pain and then occasional weakness that causes her to drop whatever she is carrying.  L hand dominant.  Will sleep on L arm/side.  + chronic neuropathy  This visit occurred during the SARS-CoV-2 public health emergency.  Safety protocols were in place, including screening questions prior to the visit, additional usage of staff PPE, and extensive cleaning of exam room while observing appropriate contact time as indicated for disinfecting solutions.       Objective:   Physical Exam General Appearance:    Alert, cooperative, no distress, appears stated age  Head:    Normocephalic, without obvious abnormality, atraumatic  Eyes:    PERRL, conjunctiva/corneas clear, EOM's intact, fundi    benign, both eyes  Ears:    Normal TM's and external ear canals, both ears  Nose:   Deferred due to COVID  Throat:   Neck:   Supple, symmetrical, trachea midline, no adenopathy;    Thyroid: no enlargement/tenderness/nodules  Back:     Symmetric, no curvature, ROM normal, no CVA tenderness  Lungs:     Clear to auscultation bilaterally, respirations unlabored  Chest Wall:    No tenderness or  deformity   Heart:    Regular rate and rhythm, S1 and S2 normal, no murmur, rub   or gallop  Breast Exam:    Deferred to mammo  Abdomen:     Soft, non-tender, bowel sounds active all four quadrants,    no masses, no organomegaly  Genitalia:    Deferred  Rectal:    Extremities:   Extremities normal, atraumatic, no cyanosis or edema  Pulses:   2+ and symmetric all extremities  Skin:   Skin color, texture, turgor normal, no rashes or lesions  Lymph nodes:   Cervical, supraclavicular, and axillary nodes normal  Neurologic:   CNII-XII intact, normal strength, sensation and reflexes    throughout          Assessment & Plan:

## 2019-07-08 DIAGNOSIS — D509 Iron deficiency anemia, unspecified: Secondary | ICD-10-CM | POA: Diagnosis not present

## 2019-07-08 DIAGNOSIS — D631 Anemia in chronic kidney disease: Secondary | ICD-10-CM | POA: Diagnosis not present

## 2019-07-08 DIAGNOSIS — L299 Pruritus, unspecified: Secondary | ICD-10-CM | POA: Diagnosis not present

## 2019-07-08 DIAGNOSIS — N186 End stage renal disease: Secondary | ICD-10-CM | POA: Diagnosis not present

## 2019-07-08 DIAGNOSIS — E1129 Type 2 diabetes mellitus with other diabetic kidney complication: Secondary | ICD-10-CM | POA: Diagnosis not present

## 2019-07-08 DIAGNOSIS — N2581 Secondary hyperparathyroidism of renal origin: Secondary | ICD-10-CM | POA: Diagnosis not present

## 2019-07-08 DIAGNOSIS — D689 Coagulation defect, unspecified: Secondary | ICD-10-CM | POA: Diagnosis not present

## 2019-07-08 DIAGNOSIS — Z992 Dependence on renal dialysis: Secondary | ICD-10-CM | POA: Diagnosis not present

## 2019-07-11 DIAGNOSIS — L299 Pruritus, unspecified: Secondary | ICD-10-CM | POA: Diagnosis not present

## 2019-07-11 DIAGNOSIS — N2581 Secondary hyperparathyroidism of renal origin: Secondary | ICD-10-CM | POA: Diagnosis not present

## 2019-07-11 DIAGNOSIS — D509 Iron deficiency anemia, unspecified: Secondary | ICD-10-CM | POA: Diagnosis not present

## 2019-07-11 DIAGNOSIS — E1129 Type 2 diabetes mellitus with other diabetic kidney complication: Secondary | ICD-10-CM | POA: Diagnosis not present

## 2019-07-11 DIAGNOSIS — D631 Anemia in chronic kidney disease: Secondary | ICD-10-CM | POA: Diagnosis not present

## 2019-07-11 DIAGNOSIS — D689 Coagulation defect, unspecified: Secondary | ICD-10-CM | POA: Diagnosis not present

## 2019-07-11 DIAGNOSIS — N186 End stage renal disease: Secondary | ICD-10-CM | POA: Diagnosis not present

## 2019-07-11 DIAGNOSIS — Z992 Dependence on renal dialysis: Secondary | ICD-10-CM | POA: Diagnosis not present

## 2019-07-13 DIAGNOSIS — Z992 Dependence on renal dialysis: Secondary | ICD-10-CM | POA: Diagnosis not present

## 2019-07-13 DIAGNOSIS — E1129 Type 2 diabetes mellitus with other diabetic kidney complication: Secondary | ICD-10-CM | POA: Diagnosis not present

## 2019-07-13 DIAGNOSIS — N186 End stage renal disease: Secondary | ICD-10-CM | POA: Diagnosis not present

## 2019-07-13 DIAGNOSIS — L299 Pruritus, unspecified: Secondary | ICD-10-CM | POA: Diagnosis not present

## 2019-07-13 DIAGNOSIS — N2581 Secondary hyperparathyroidism of renal origin: Secondary | ICD-10-CM | POA: Diagnosis not present

## 2019-07-13 DIAGNOSIS — D509 Iron deficiency anemia, unspecified: Secondary | ICD-10-CM | POA: Diagnosis not present

## 2019-07-13 DIAGNOSIS — D689 Coagulation defect, unspecified: Secondary | ICD-10-CM | POA: Diagnosis not present

## 2019-07-13 DIAGNOSIS — D631 Anemia in chronic kidney disease: Secondary | ICD-10-CM | POA: Diagnosis not present

## 2019-07-15 ENCOUNTER — Telehealth: Payer: Self-pay | Admitting: Family Medicine

## 2019-07-15 DIAGNOSIS — D689 Coagulation defect, unspecified: Secondary | ICD-10-CM | POA: Diagnosis not present

## 2019-07-15 DIAGNOSIS — N2581 Secondary hyperparathyroidism of renal origin: Secondary | ICD-10-CM | POA: Diagnosis not present

## 2019-07-15 DIAGNOSIS — N186 End stage renal disease: Secondary | ICD-10-CM | POA: Diagnosis not present

## 2019-07-15 DIAGNOSIS — Z992 Dependence on renal dialysis: Secondary | ICD-10-CM | POA: Diagnosis not present

## 2019-07-15 DIAGNOSIS — L299 Pruritus, unspecified: Secondary | ICD-10-CM | POA: Diagnosis not present

## 2019-07-15 DIAGNOSIS — E1129 Type 2 diabetes mellitus with other diabetic kidney complication: Secondary | ICD-10-CM | POA: Diagnosis not present

## 2019-07-15 DIAGNOSIS — D631 Anemia in chronic kidney disease: Secondary | ICD-10-CM | POA: Diagnosis not present

## 2019-07-15 DIAGNOSIS — D509 Iron deficiency anemia, unspecified: Secondary | ICD-10-CM | POA: Diagnosis not present

## 2019-07-15 NOTE — Progress Notes (Signed)
  Chronic Care Management   Outreach Note  07/15/2019 Name: ANEISHA SKYLES MRN: [id number] DOB: 1956/02/11  Referred by: Midge Minium, MD Reason for referral : No chief complaint on file.   An unsuccessful telephone outreach was attempted today. The patient was referred to the pharmacist for assistance with care management and care coordination.    This note is not being shared with the patient for the following reason: To respect privacy (The patient or proxy has requested that the information not be shared).  Follow Up Plan:   Earney Hamburg Upstream Scheduler

## 2019-07-18 DIAGNOSIS — D689 Coagulation defect, unspecified: Secondary | ICD-10-CM | POA: Diagnosis not present

## 2019-07-18 DIAGNOSIS — D509 Iron deficiency anemia, unspecified: Secondary | ICD-10-CM | POA: Diagnosis not present

## 2019-07-18 DIAGNOSIS — L299 Pruritus, unspecified: Secondary | ICD-10-CM | POA: Diagnosis not present

## 2019-07-18 DIAGNOSIS — D631 Anemia in chronic kidney disease: Secondary | ICD-10-CM | POA: Diagnosis not present

## 2019-07-18 DIAGNOSIS — E1129 Type 2 diabetes mellitus with other diabetic kidney complication: Secondary | ICD-10-CM | POA: Diagnosis not present

## 2019-07-18 DIAGNOSIS — N186 End stage renal disease: Secondary | ICD-10-CM | POA: Diagnosis not present

## 2019-07-18 DIAGNOSIS — N2581 Secondary hyperparathyroidism of renal origin: Secondary | ICD-10-CM | POA: Diagnosis not present

## 2019-07-18 DIAGNOSIS — Z992 Dependence on renal dialysis: Secondary | ICD-10-CM | POA: Diagnosis not present

## 2019-07-19 ENCOUNTER — Other Ambulatory Visit: Payer: Self-pay

## 2019-07-19 ENCOUNTER — Encounter: Payer: Self-pay | Admitting: Orthopaedic Surgery

## 2019-07-19 ENCOUNTER — Ambulatory Visit: Payer: Self-pay

## 2019-07-19 ENCOUNTER — Ambulatory Visit (INDEPENDENT_AMBULATORY_CARE_PROVIDER_SITE_OTHER): Payer: Medicare HMO | Admitting: Orthopaedic Surgery

## 2019-07-19 DIAGNOSIS — M25512 Pain in left shoulder: Secondary | ICD-10-CM

## 2019-07-19 DIAGNOSIS — G8929 Other chronic pain: Secondary | ICD-10-CM | POA: Diagnosis not present

## 2019-07-19 NOTE — Progress Notes (Signed)
Office Visit Note   Patient: [name] [name]           Date of Birth: 07/07/1955           MRN: [id number] Visit Date: 07/19/2019              Requested by: Midge Minium, MD [address] [place],  [place] [address] PCP: Midge Minium, MD   Assessment & Plan: Visit Diagnoses:  1. Chronic left shoulder pain     Plan: Impression is left shoulder AC joint pain.  We have discussed cortisone injection but the patient notes that her symptoms are not bad enough to proceed with this.  She will follow up with [place] should her symptoms persist or worsen.  Call with concerns or questions in the meantime.  Follow-Up Instructions: Return if symptoms worsen or fail to improve.   Orders:  Orders Placed This Encounter  Procedures  . XR Shoulder Left   No orders of the defined types were placed in this encounter.     Procedures: No procedures performed   Clinical Data: No additional findings.   Subjective: Chief Complaint  Patient presents with  . Left Shoulder - Pain    HPI is a pleasant 64 year old female who presents to our clinic today with left shoulder pain for the past 4 weeks.  No known injury or change in activity.  She does note that this is the arm where she received dialysis.  The pain she has is primarily to the anterior and superior aspects.  This is really aggravated only when lifting her arm above the level of her shoulder.  She occasionally notes tingling into her fingertips.  No previous shoulder injection or surgical intervention.  Review of Systems as detailed in HPI.  All others reviewed and are negative.   Objective: Vital Signs: There were no vitals taken for this visit.  Physical Exam well-developed well-nourished female no acute distress.  Alert and oriented x3.  Ortho Exam examination of her left shoulder reveals mild tenderness to the bicipital groove but more so to the [facility] joint.  She does have near full active range of motion but  slight pain with the extremes of forward flexion.  She can internally rotate to 5. minimally positive empty can.  Minimally positive cross body adduction.  She is neurovascular intact distally.  Specialty Comments:  No specialty comments available.  Imaging: XR Shoulder Left  Result Date: 07/19/2019 No acute or structural abnormalities    PMFS History: Patient Active Problem List   Diagnosis Date Noted  . Stress incontinence 12/24/2017  . Cough variant asthma 12/24/2017  . Tremor of left hand 12/24/2017  . History of internal jugular thrombosis 03/26/2017  . 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 Anemia 07/08/2016  . Abnormal brain MRI 07/03/2016  . Fasciculation 06/18/2016  . Carotid artery disease ([place])   . Physical exam 01/28/2016  . Hyperlipidemia 06/28/2015  . Diabetes mellitus type II, controlled ([place]) 06/28/2015  . Thoracic ascending aortic aneurysm (41 mm on Echo 06/2013) 07/06/2013  . ESRD on dialysis ([place]) 06/16/2013  . History of renal transplant 06/16/2013  . GERD (gastroesophageal reflux disease) 08/13/2009  . OTH&UNSPEC NONINFECTIOUS GASTROENTERITIS&COLITIS 08/13/2009  . Essential hypertension 04/25/2009  . Hemorrhoids 04/25/2009  . WEIGHT LOSS 04/25/2009  . NAUSEA AND VOMITING 04/25/2009  . PERSONAL HX COLONIC POLYPS 04/25/2009   Past Medical History:  Diagnosis Date  . Adenomatous colon polyp 04/1998  . Anemia   .  Anxiety   . Arthritis   . Carotid artery disease ([place])    Carotid US 1/18: bilateral ICA 1-39, R thyroid lobe nodule (1.9x2.2x3cm); numerous L thyroid lobe nodules - repeat 1 year  . Chronic kidney disease    M/W/[facility]  . Chronic renal failure    post transplant  . Depression   . Diabetes mellitus    diet controlled  . EBV infection    Per [facility]  . Encephalitis    NMDA per [facility]  . Esophagitis    Grade 1 Distal  . Focal seizure ([facility])    .  Last one 06/2016  . GERD (gastroesophageal reflux  disease)   . Hemorrhoids   . History of blood transfusion   . History of kidney stones    Passed  . History of subdural hematoma   . Hx of cardiovascular stress test    Lexiscan Myoview (06/2013):  No ischemia, EF 66%; normal.  //  Myoview 12/17: EF 62, no ischemia or scar; Normal  . Hx of echocardiogram    a. Echocardiogram (06/2013):  Mod focal basal hypertrophy, EF 60-65%, normal wall motion, Gr 1 DD, mild AI, mildly dilated ascending aorta (41 mm), mild LAE.; b.  Echo 9/16: mod LVH, EF 60-65%, no RWMA, Gr 1 DD, trivial AI, mild dilated ascending aorta, mild LAE  . Hyperkalemia   . Hyperlipidemia   . Hypertension   . Hypomagnesemia   . Left jugular vein thrombosis    Partial resolved per [facility]  . Metabolic acidosis   . Pneumonia   . Right jugular vein thrombosis    resolved from [facility]  . Seizures ([place])     Family History  Problem Relation Age of Onset  . Kidney disease Paternal Aunt   . Heart disease Mother   . Heart disease Father   . Colon cancer Neg Hx     Past Surgical History:  Procedure Laterality Date  . A/V FISTULAGRAM Left 06/04/2017   Procedure: A/V FISTULAGRAM;  Surgeon: Conrad [place], MD;  Location: [place] [facility];  Service: Cardiovascular;  Laterality: Left;  . A/V FISTULAGRAM N/A 03/24/2019   Procedure: A/V FISTULAGRAM - left arm;  Surgeon: [name], MD;  Location: [place] [facility];  Service: Cardiovascular;  Laterality: N/A;  . ABDOMINAL HYSTERECTOMY    . AV FISTULA PLACEMENT  07/04/2005   Cimino AV fistula  . AV FISTULA PLACEMENT  08/27/2005  . AV FISTULA PLACEMENT Left 04/29/2017   Procedure: Creation of Left Arm ARTERIOVENOUS BRACHIOCEPHALIC FISTULA;  Surgeon: Elam Dutch, MD;  Location: [place];  Service: Vascular;  Laterality: Left;  . AV FISTULA PLACEMENT Left 06/16/2017   Procedure: BRACHIO_BASCILIC ARTERIOVENOUS (AV) FISTULA CREATION;  Surgeon: Waynetta Sandy, MD;  Location: [place];  Service: Vascular;  Laterality: Left;  . AV  FISTULA PLACEMENT W/ PTFE  08/27/2005  . BASCILIC VEIN TRANSPOSITION Left 06/16/2017   Procedure: BRACHIOCEPHALIC TRANSPOSITION  LEFT ARM;  Surgeon: Waynetta Sandy, MD;  Location: [place];  Service: Vascular;  Laterality: Left;  . BASCILIC VEIN TRANSPOSITION Left 09/01/2017   Procedure: BASILIC VEIN TRANSPOSITION SECOND STAGE;  Surgeon: Waynetta Sandy, MD;  Location: [place];  Service: Vascular;  Laterality: Left;  . BRAIN BIOPSY    . CESAREAN SECTION    . COLONOSCOPY W/ POLYPECTOMY    . DG AV DIALYSIS GRAFT DECLOT OR  07/24/2005   AV Gore-Tex graf  . DG AV DIALYSIS GRAFT DECLOT OR  Thrombosis right  forearm, loop arteriovenous   Thrombosis right forearm, loop arteriovenous graft  . GASTROSTOMY TUBE PLACEMENT    . KIDNEY TRANSPLANT  2009   Both  . PERIPHERAL VASCULAR BALLOON ANGIOPLASTY Left 03/24/2019   Procedure: PERIPHERAL VASCULAR BALLOON ANGIOPLASTY;  Surgeon: [name], MD;  Location: [place] [facility];  Service: Cardiovascular;  Laterality: Left;  left AV fistula  . REMOVAL OF GASTROSTOMY TUBE    . REVISON OF ARTERIOVENOUS FISTULA Left 11/23/2018   Procedure: REVISION PLICATION OF ARTERIOVENOUS FISTULA;  Surgeon: Waynetta Sandy, MD;  Location: [place];  Service: Vascular;  Laterality: Left;  . THROMBECTOMY / ARTERIOVENOUS GRAFT REVISION  10/12/2006  . THROMBECTOMY / ARTERIOVENOUS GRAFT REVISION  10/16/2006   Social History   Occupational History  . Occupation: Investment banker, operational   Tobacco Use  . Smoking status: Former Research scientist (life sciences)  . Smokeless tobacco: Never Used  . Tobacco comment: smoked in teens  Substance and Sexual Activity  . Alcohol use: No  . Drug use: No  . Sexual activity: Never

## 2019-07-20 DIAGNOSIS — N2581 Secondary hyperparathyroidism of renal origin: Secondary | ICD-10-CM | POA: Diagnosis not present

## 2019-07-20 DIAGNOSIS — L299 Pruritus, unspecified: Secondary | ICD-10-CM | POA: Diagnosis not present

## 2019-07-20 DIAGNOSIS — D689 Coagulation defect, unspecified: Secondary | ICD-10-CM | POA: Diagnosis not present

## 2019-07-20 DIAGNOSIS — N186 End stage renal disease: Secondary | ICD-10-CM | POA: Diagnosis not present

## 2019-07-20 DIAGNOSIS — Z992 Dependence on renal dialysis: Secondary | ICD-10-CM | POA: Diagnosis not present

## 2019-07-20 DIAGNOSIS — D509 Iron deficiency anemia, unspecified: Secondary | ICD-10-CM | POA: Diagnosis not present

## 2019-07-20 DIAGNOSIS — D631 Anemia in chronic kidney disease: Secondary | ICD-10-CM | POA: Diagnosis not present

## 2019-07-20 DIAGNOSIS — E1129 Type 2 diabetes mellitus with other diabetic kidney complication: Secondary | ICD-10-CM | POA: Diagnosis not present

## 2019-07-22 ENCOUNTER — Telehealth: Payer: Self-pay | Admitting: Family Medicine

## 2019-07-22 DIAGNOSIS — N2581 Secondary hyperparathyroidism of renal origin: Secondary | ICD-10-CM | POA: Diagnosis not present

## 2019-07-22 DIAGNOSIS — N186 End stage renal disease: Secondary | ICD-10-CM | POA: Diagnosis not present

## 2019-07-22 DIAGNOSIS — L299 Pruritus, unspecified: Secondary | ICD-10-CM | POA: Diagnosis not present

## 2019-07-22 DIAGNOSIS — E1129 Type 2 diabetes mellitus with other diabetic kidney complication: Secondary | ICD-10-CM | POA: Diagnosis not present

## 2019-07-22 DIAGNOSIS — Z992 Dependence on renal dialysis: Secondary | ICD-10-CM | POA: Diagnosis not present

## 2019-07-22 DIAGNOSIS — I150 Renovascular hypertension: Secondary | ICD-10-CM | POA: Diagnosis not present

## 2019-07-22 DIAGNOSIS — D509 Iron deficiency anemia, unspecified: Secondary | ICD-10-CM | POA: Diagnosis not present

## 2019-07-22 DIAGNOSIS — D689 Coagulation defect, unspecified: Secondary | ICD-10-CM | POA: Diagnosis not present

## 2019-07-22 DIAGNOSIS — D631 Anemia in chronic kidney disease: Secondary | ICD-10-CM | POA: Diagnosis not present

## 2019-07-22 NOTE — Progress Notes (Signed)
  Chronic Care Management   Outreach Note  07/22/2019 Name: [name] [name] MRN: [id number] DOB: 1955/07/17  Referred by: Midge Minium, MD Reason for referral : No chief complaint on file.   An unsuccessful telephone outreach was attempted today. The patient was referred to the pharmacist for assistance with care management and care coordination.  This note is not being shared with the patient for the following reason: To respect privacy (The patient or proxy has requested that the information not be shared). Follow Up Plan:   Earney [place] Upstream Scheduler

## 2019-07-25 DIAGNOSIS — N2581 Secondary hyperparathyroidism of renal origin: Secondary | ICD-10-CM | POA: Diagnosis not present

## 2019-07-25 DIAGNOSIS — D509 Iron deficiency anemia, unspecified: Secondary | ICD-10-CM | POA: Diagnosis not present

## 2019-07-25 DIAGNOSIS — L299 Pruritus, unspecified: Secondary | ICD-10-CM | POA: Diagnosis not present

## 2019-07-25 DIAGNOSIS — E1129 Type 2 diabetes mellitus with other diabetic kidney complication: Secondary | ICD-10-CM | POA: Diagnosis not present

## 2019-07-25 DIAGNOSIS — D689 Coagulation defect, unspecified: Secondary | ICD-10-CM | POA: Diagnosis not present

## 2019-07-25 DIAGNOSIS — Z992 Dependence on renal dialysis: Secondary | ICD-10-CM | POA: Diagnosis not present

## 2019-07-25 DIAGNOSIS — D631 Anemia in chronic kidney disease: Secondary | ICD-10-CM | POA: Diagnosis not present

## 2019-07-25 DIAGNOSIS — N186 End stage renal disease: Secondary | ICD-10-CM | POA: Diagnosis not present

## 2019-07-27 ENCOUNTER — Other Ambulatory Visit: Payer: Self-pay | Admitting: Family Medicine

## 2019-07-27 DIAGNOSIS — N2581 Secondary hyperparathyroidism of renal origin: Secondary | ICD-10-CM | POA: Diagnosis not present

## 2019-07-27 DIAGNOSIS — D509 Iron deficiency anemia, unspecified: Secondary | ICD-10-CM | POA: Diagnosis not present

## 2019-07-27 DIAGNOSIS — D689 Coagulation defect, unspecified: Secondary | ICD-10-CM | POA: Diagnosis not present

## 2019-07-27 DIAGNOSIS — E1129 Type 2 diabetes mellitus with other diabetic kidney complication: Secondary | ICD-10-CM | POA: Diagnosis not present

## 2019-07-27 DIAGNOSIS — N186 End stage renal disease: Secondary | ICD-10-CM | POA: Diagnosis not present

## 2019-07-27 DIAGNOSIS — L299 Pruritus, unspecified: Secondary | ICD-10-CM | POA: Diagnosis not present

## 2019-07-27 DIAGNOSIS — Z992 Dependence on renal dialysis: Secondary | ICD-10-CM | POA: Diagnosis not present

## 2019-07-27 DIAGNOSIS — D631 Anemia in chronic kidney disease: Secondary | ICD-10-CM | POA: Diagnosis not present

## 2019-07-28 ENCOUNTER — Encounter (HOSPITAL_COMMUNITY): Payer: Self-pay | Admitting: Emergency Medicine

## 2019-07-28 ENCOUNTER — Telehealth: Payer: Self-pay | Admitting: Family Medicine

## 2019-07-28 ENCOUNTER — Emergency Department (HOSPITAL_COMMUNITY): Payer: Medicare HMO

## 2019-07-28 ENCOUNTER — Observation Stay (HOSPITAL_COMMUNITY)
Admission: EM | Admit: 2019-07-28 | Discharge: 2019-07-29 | Disposition: A | Discharge status: Home / Self Care | Payer: Medicare HMO | Attending: Internal Medicine | Admitting: Internal Medicine

## 2019-07-28 DIAGNOSIS — Z87898 Personal history of other specified conditions: Secondary | ICD-10-CM

## 2019-07-28 DIAGNOSIS — N186 End stage renal disease: Secondary | ICD-10-CM

## 2019-07-28 DIAGNOSIS — I2510 Atherosclerotic heart disease of native coronary artery without angina pectoris: Secondary | ICD-10-CM | POA: Insufficient documentation

## 2019-07-28 DIAGNOSIS — I10 Essential (primary) hypertension: Secondary | ICD-10-CM

## 2019-07-28 DIAGNOSIS — D499 Neoplasm of unspecified behavior of unspecified site: Secondary | ICD-10-CM

## 2019-07-28 DIAGNOSIS — Z7984 Long term (current) use of oral hypoglycemic drugs: Secondary | ICD-10-CM | POA: Diagnosis not present

## 2019-07-28 DIAGNOSIS — R0602 Shortness of breath: Secondary | ICD-10-CM | POA: Diagnosis not present

## 2019-07-28 DIAGNOSIS — Z03818 Encounter for observation for suspected exposure to other biological agents ruled out: Secondary | ICD-10-CM | POA: Diagnosis not present

## 2019-07-28 DIAGNOSIS — Z79899 Other long term (current) drug therapy: Secondary | ICD-10-CM | POA: Insufficient documentation

## 2019-07-28 DIAGNOSIS — Z20822 Contact with and (suspected) exposure to covid-19: Secondary | ICD-10-CM | POA: Insufficient documentation

## 2019-07-28 DIAGNOSIS — E785 Hyperlipidemia, unspecified: Secondary | ICD-10-CM | POA: Diagnosis not present

## 2019-07-28 DIAGNOSIS — E119 Type 2 diabetes mellitus without complications: Secondary | ICD-10-CM

## 2019-07-28 DIAGNOSIS — Z940 Kidney transplant status: Secondary | ICD-10-CM | POA: Diagnosis not present

## 2019-07-28 DIAGNOSIS — I120 Hypertensive chronic kidney disease with stage 5 chronic kidney disease or end stage renal disease: Secondary | ICD-10-CM | POA: Diagnosis not present

## 2019-07-28 DIAGNOSIS — R29810 Facial weakness: Secondary | ICD-10-CM | POA: Diagnosis not present

## 2019-07-28 DIAGNOSIS — Z992 Dependence on renal dialysis: Secondary | ICD-10-CM | POA: Insufficient documentation

## 2019-07-28 DIAGNOSIS — R531 Weakness: Secondary | ICD-10-CM | POA: Diagnosis not present

## 2019-07-28 DIAGNOSIS — E1122 Type 2 diabetes mellitus with diabetic chronic kidney disease: Secondary | ICD-10-CM | POA: Diagnosis not present

## 2019-07-28 DIAGNOSIS — G40909 Epilepsy, unspecified, not intractable, without status epilepticus: Secondary | ICD-10-CM | POA: Insufficient documentation

## 2019-07-28 DIAGNOSIS — Z87891 Personal history of nicotine dependence: Secondary | ICD-10-CM | POA: Diagnosis not present

## 2019-07-28 DIAGNOSIS — E7849 Other hyperlipidemia: Secondary | ICD-10-CM | POA: Diagnosis not present

## 2019-07-28 DIAGNOSIS — R251 Tremor, unspecified: Secondary | ICD-10-CM | POA: Diagnosis present

## 2019-07-28 DIAGNOSIS — E1169 Type 2 diabetes mellitus with other specified complication: Secondary | ICD-10-CM | POA: Diagnosis present

## 2019-07-28 LAB — BASIC METABOLIC PANEL
Anion gap: 12 (ref 5–15)
BUN: 25 mg/dL — ABNORMAL HIGH (ref 8–23)
CO2: 31 mmol/L (ref 22–32)
Calcium: 9.1 mg/dL (ref 8.9–10.3)
Chloride: 96 mmol/L — ABNORMAL LOW (ref 98–111)
Creatinine, Ser: 7.39 mg/dL — ABNORMAL HIGH (ref 0.44–1.00)
GFR calc Af Amer: 6 mL/min — ABNORMAL LOW (ref >60)
GFR calc non Af Amer: 5 mL/min — ABNORMAL LOW (ref >60)
Glucose, Bld: 132 mg/dL — ABNORMAL HIGH (ref 70–99)
Potassium: 4.7 mmol/L (ref 3.5–5.1)
Sodium: 139 mmol/L (ref 135–145)

## 2019-07-28 LAB — AMMONIA: Ammonia: 20 umol/L (ref 9–35)

## 2019-07-28 LAB — CBC
HCT: 30.8 % — ABNORMAL LOW (ref 36.0–46.0)
Hemoglobin: 9.4 g/dL — ABNORMAL LOW (ref 12.0–15.0)
MCH: 30.4 pg (ref 26.0–34.0)
MCHC: 30.5 g/dL (ref 30.0–36.0)
MCV: 99.7 fL (ref 80.0–100.0)
Platelets: 218 10*3/uL (ref 150–400)
RBC: 3.09 MIL/uL — ABNORMAL LOW (ref 3.87–5.11)
RDW: 16.7 % — ABNORMAL HIGH (ref 11.5–15.5)
WBC: 13.7 10*3/uL — ABNORMAL HIGH (ref 4.0–10.5)
nRBC: 0 % (ref 0.0–0.2)

## 2019-07-28 MED ORDER — VALPROIC ACID 250 MG/5ML PO SOLN
1250.0000 mg | Freq: Once | ORAL | Status: DC
  Filled 2019-07-28: qty 25

## 2019-07-28 MED ORDER — DIVALPROEX SODIUM 250 MG PO DR TAB
500.0000 mg | DELAYED_RELEASE_TABLET | Freq: Two times a day (BID) | ORAL | Status: DC
  Administered 2019-07-29: 500 mg via ORAL
  Filled 2019-07-28: qty 2

## 2019-07-28 MED ORDER — AMLODIPINE BESYLATE 10 MG PO TABS
10.0000 mg | ORAL_TABLET | Freq: Every day | ORAL | Status: DC
  Filled 2019-07-28: qty 1

## 2019-07-28 MED ORDER — ATORVASTATIN CALCIUM 40 MG PO TABS
40.0000 mg | ORAL_TABLET | Freq: Every day | ORAL | Status: DC
  Administered 2019-07-29: 40 mg via ORAL
  Filled 2019-07-28: qty 1

## 2019-07-28 MED ORDER — PANTOPRAZOLE SODIUM 40 MG PO TBEC
40.0000 mg | DELAYED_RELEASE_TABLET | Freq: Every day | ORAL | Status: DC
  Administered 2019-07-29: 40 mg via ORAL
  Filled 2019-07-28: qty 1

## 2019-07-28 MED ORDER — ISOSORBIDE DINITRATE 20 MG PO TABS
20.0000 mg | ORAL_TABLET | Freq: Three times a day (TID) | ORAL | Status: DC
  Administered 2019-07-29: 20 mg via ORAL
  Filled 2019-07-28 (×4): qty 1

## 2019-07-28 MED ORDER — HEPARIN SODIUM (PORCINE) 5000 UNIT/ML IJ SOLN
5000.0000 [IU] | Freq: Three times a day (TID) | INTRAMUSCULAR | Status: DC
  Administered 2019-07-28 – 2019-07-29 (×3): 5000 [IU] via SUBCUTANEOUS
  Filled 2019-07-28 (×3): qty 1

## 2019-07-28 MED ORDER — RENA-VITE PO TABS
1.0000 | ORAL_TABLET | ORAL | Status: DC
  Administered 2019-07-29: 1 via ORAL
  Filled 2019-07-28 (×2): qty 1

## 2019-07-28 MED ORDER — QUETIAPINE FUMARATE 100 MG PO TABS
100.0000 mg | ORAL_TABLET | Freq: Every day | ORAL | Status: DC
  Filled 2019-07-28: qty 1

## 2019-07-28 MED ORDER — FERRIC CITRATE 1 GM 210 MG(FE) PO TABS
210.0000 mg | ORAL_TABLET | Freq: Three times a day (TID) | ORAL | Status: DC
  Administered 2019-07-29 (×2): 210 mg via ORAL
  Filled 2019-07-28 (×4): qty 1

## 2019-07-28 NOTE — Telephone Encounter (Signed)
fyi

## 2019-07-28 NOTE — ED Notes (Signed)
Pt transported to XR.  

## 2019-07-28 NOTE — Telephone Encounter (Signed)
FYI Patient called wanting to let Dr. Birdie Riddle know that she has be admitted into the hospital.  Patient states she has been jerking and does not know if it was caused by meds or not.

## 2019-07-28 NOTE — ED Triage Notes (Addendum)
Pt arrives via [facility] from home with c/o tremor in bilateral hands upon waking this morning, went to bed around 10 pm. Endorses worsening of tremor when attempting to use her hands to pick up an object. Also endorses some dizziness that began yesterday. A/ox4, face symmetrical, speech clear, moves all limbs equally. Does do dialysis, had full session yesterday. Hx of seizures.

## 2019-07-28 NOTE — ED Notes (Signed)
PA Caccavale advised this RN to order cbc and bmp at this time.

## 2019-07-28 NOTE — ED Notes (Signed)
Pt placement called RN and stated pt was not approved for 5C.

## 2019-07-28 NOTE — ED Provider Notes (Signed)
[facility] EMERGENCY DEPARTMENT Provider Note   CSN: [id number] Arrival date & time: 07/28/19  1219     History Chief Complaint  Patient presents with  . Tremors    [name] [name] is a 64 y.o. female.  64 y.o female with a PMH of Anxiety, CAD, DM, ESRD (MWF) presents to the ED via EMS for tremors x last night.  Currently Patient describes this as shaking of her arms, chest and feet, this occurs constantly.  She is currently on gabapentin 300 3 times daily for seizures.  She reports also feeling shortness of breath overnight, states that she had dialysis yesterday, having extra treatment as she has been doing this recently.  Does report the gabapentin has made her shaky in the past, states that she has been taking this medication daily, and believes that this is likely the culprit of her symptoms.  No fevers, headaches, changes to vision, or weakness.   The history is provided by the patient and medical records.       Past Medical History:  Diagnosis Date  . Adenomatous colon polyp 04/1998  . Anemia   . Anxiety   . Arthritis   . Carotid artery disease ([place])    Carotid US 1/18: bilateral ICA 1-39, R thyroid lobe nodule (1.9x2.2x3cm); numerous L thyroid lobe nodules - repeat 1 year  . Chronic kidney disease    M/W[facility]  . Chronic renal failure    post transplant  . Depression   . Diabetes mellitus    diet controlled  . EBV infection    Per [facility]  . Encephalitis    NMDA per [facility]  . Esophagitis    Grade 1 Distal  . Focal seizure (H[facility])    .  Last one 06/2016  . GERD (gastroesophageal reflux disease)   . Hemorrhoids   . History of blood transfusion   . History of kidney stones    Passed  . History of subdural hematoma   . Hx of cardiovascular stress test    Lexiscan Myoview (06/2013):  No ischemia, EF 66%; normal.  //  Myoview 12/17: EF 62, no ischemia or scar; Normal  . Hx of echocardiogram    a. Echocardiogram (06/2013):  Mod focal basal  hypertrophy, EF 60-65%, normal wall motion, Gr 1 DD, mild AI, mildly dilated ascending aorta (41 mm), mild LAE.; b.  Echo 9/16: mod LVH, EF 60-65%, no RWMA, Gr 1 DD, trivial AI, mild dilated ascending aorta, mild LAE  . Hyperkalemia   . Hyperlipidemia   . Hypertension   . Hypomagnesemia   . Left jugular vein thrombosis    Partial resolved per [facility]  . Metabolic acidosis   . Pneumonia   . Right jugular vein thrombosis    resolved from [facility]  . Seizures [facility])     Patient Active Problem List   Diagnosis Date Noted  . Stress incontinence 12/24/2017  . Cough variant asthma 12/24/2017  . Tremor of left hand 12/24/2017  . History of internal jugular thrombosis 03/26/2017  . 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 Anemia 07/08/2016  . Abnormal brain MRI 07/03/2016  . Fasciculation 06/18/2016  . Carotid artery disease ([place])   . Physical exam 01/28/2016  . Hyperlipidemia 06/28/2015  . Diabetes mellitus type II, controlled ([place]) 06/28/2015  . Thoracic ascending aortic aneurysm (41 mm on Echo 06/2013) 07/06/2013  . ESRD on dialysis ([place]) 06/16/2013  . History of renal transplant 06/16/2013  . GERD (gastroesophageal reflux disease)  08/13/2009  . OTH&UNSPEC NONINFECTIOUS GASTROENTERITIS&COLITIS 08/13/2009  . Essential hypertension 04/25/2009  . Hemorrhoids 04/25/2009  . WEIGHT LOSS 04/25/2009  . NAUSEA AND VOMITING 04/25/2009  . PERSONAL HX COLONIC POLYPS 04/25/2009    Past Surgical History:  Procedure Laterality Date  . A/V FISTULAGRAM Left 06/04/2017   Procedure: A/V FISTULAGRAM;  Surgeon: Conrad [place], MD;  Location: [place] [facility];  Service: Cardiovascular;  Laterality: Left;  . A/V FISTULAGRAM N/A 03/24/2019   Procedure: A/V FISTULAGRAM - left arm;  Surgeon: [name], MD;  Location: [place] [facility];  Service: Cardiovascular;  Laterality: N/A;  . ABDOMINAL HYSTERECTOMY    . AV FISTULA PLACEMENT  07/04/2005   Cimino AV fistula  .  AV FISTULA PLACEMENT  08/27/2005  . AV FISTULA PLACEMENT Left 04/29/2017   Procedure: Creation of Left Arm ARTERIOVENOUS BRACHIOCEPHALIC FISTULA;  Surgeon: Elam Dutch, MD;  Location: [place];  Service: Vascular;  Laterality: Left;  . AV FISTULA PLACEMENT Left 06/16/2017   Procedure: BRACHIO_BASCILIC ARTERIOVENOUS (AV) FISTULA CREATION;  Surgeon: Waynetta Sandy, MD;  Location: [place];  Service: Vascular;  Laterality: Left;  . AV FISTULA PLACEMENT W/ PTFE  08/27/2005  . BASCILIC VEIN TRANSPOSITION Left 06/16/2017   Procedure: BRACHIOCEPHALIC TRANSPOSITION  LEFT ARM;  Surgeon: Waynetta Sandy, MD;  Location: [place];  Service: Vascular;  Laterality: Left;  . BASCILIC VEIN TRANSPOSITION Left 09/01/2017   Procedure: BASILIC VEIN TRANSPOSITION SECOND STAGE;  Surgeon: Waynetta Sandy, MD;  Location: [place];  Service: Vascular;  Laterality: Left;  . BRAIN BIOPSY    . CESAREAN SECTION    . COLONOSCOPY W/ POLYPECTOMY    . DG AV DIALYSIS GRAFT DECLOT OR  07/24/2005   AV Gore-Tex graf  . DG AV DIALYSIS GRAFT DECLOT OR  Thrombosis right forearm, loop arteriovenous   Thrombosis right forearm, loop arteriovenous graft  . GASTROSTOMY TUBE PLACEMENT    . KIDNEY TRANSPLANT  2009   Both  . PERIPHERAL VASCULAR BALLOON ANGIOPLASTY Left 03/24/2019   Procedure: PERIPHERAL VASCULAR BALLOON ANGIOPLASTY;  Surgeon: [name], MD;  Location: [place] [facility];  Service: Cardiovascular;  Laterality: Left;  left AV fistula  . REMOVAL OF GASTROSTOMY TUBE    . REVISON OF ARTERIOVENOUS FISTULA Left 11/23/2018   Procedure: REVISION PLICATION OF ARTERIOVENOUS FISTULA;  Surgeon: Waynetta Sandy, MD;  Location: [place];  Service: Vascular;  Laterality: Left;  . THROMBECTOMY / ARTERIOVENOUS GRAFT REVISION  10/12/2006  . THROMBECTOMY / ARTERIOVENOUS GRAFT REVISION  10/16/2006     OB History   No obstetric history on file.     Family History  Problem Relation Age of Onset  .  Kidney disease Paternal Aunt   . Heart disease Mother   . Heart disease Father   . Colon cancer Neg Hx     Social History   Tobacco Use  . Smoking status: Former Research scientist (life sciences)  . Smokeless tobacco: Never Used  . Tobacco comment: smoked in teens  Substance Use Topics  . Alcohol use: No  . Drug use: No    Home Medications Prior to Admission medications   Medication Sig Start Date End Date Taking? Authorizing Provider  acetaminophen (TYLENOL) 650 MG CR tablet Take 650-1,300 mg by mouth every 8 (eight) hours as needed for pain.     [provider]  albuterol (VENTOLIN HFA) 108 (90 Base) MCG/ACT inhaler INHALE 2 PUFFS INTO THE LUNGS EVERY 4 (FOUR) HOURS AS NEEDED FOR WHEEZING OR SHORTNESS OF  BREATH. 03/31/19   Midge Minium, MD  amLODipine (NORVASC) 10 MG tablet TAKE 1 TABLET BY MOUTH EVERY DAY 03/08/19   Midge Minium, MD  atorvastatin (LIPITOR) 40 MG tablet Take 1 tablet (40 mg total) by mouth daily. 07/06/19   Nahser, Wonda Cheng, MD  blood glucose meter kit and supplies KIT Dispense based on patient and insurance preference. Patient should test sugars twice daily as directed. Dx. E11.9 12/10/18   Midge Minium, MD  cetirizine (ZYRTEC) 10 MG tablet TAKE 1 TABLET BY MOUTH EVERY DAY 07/27/19   Midge Minium, MD  ferric citrate (AURYXIA) 1 GM 210 MG(Fe) tablet Take 210 mg by mouth See admin instructions. Take 210 mg by mouth three times a day with meals and 210 mg two times a day with snacks    [provider]  gabapentin (NEURONTIN) 100 MG capsule TAKE 1 CAPSULE (100 MG TOTAL) BY MOUTH AT BEDTIME. 05/30/19   Midge Minium, MD  gabapentin (NEURONTIN) 300 MG capsule TAKE 1 CAPSULE BY MOUTH THREE TIMES A DAY 05/02/19   Midge Minium, MD  glucose blood (ONETOUCH ULTRA) test strip Use as instructed to test sugars twice daily. Dx. E11.9 03/29/19   Midge Minium, MD  HYDROcodone-acetaminophen (NORCO/VICODIN) 5-325 MG tablet Take 1 tablet by mouth every 6 (six)  hours as needed. 04/12/19   [provider]  isosorbide dinitrate (ISORDIL) 20 MG tablet TAKE 1 TABLET BY MOUTH THREE TIMES A DAY 02/02/19   Midge Minium, MD  loratadine (CLARITIN) 10 MG tablet Take 10 mg by mouth daily as needed for allergies.    [provider]  meclizine (ANTIVERT) 25 MG tablet TAKE 1 TABLET (25 MG TOTAL) BY MOUTH 3 (THREE) TIMES DAILY AS NEEDED FOR DIZZINESS. Patient not taking: Reported on 07/07/2019 05/02/19   Midge Minium, MD  Methoxy PEG-Epoetin Beta (MIRCERA IJ) Mircera 04/29/19 06/01/20  [provider]  multivitamin (RENA-VIT) TABS tablet Take 1 tablet by mouth every morning.  03/20/17   [provider]  nitroGLYCERIN (NITROSTAT) 0.4 MG SL tablet  06/16/13   [provider]  omeprazole (PRILOSEC) 40 MG capsule TAKE 1 CAPSULE BY MOUTH EVERY DAY 05/09/19   Midge Minium, MD  QUEtiapine (SEROQUEL) 100 MG tablet TAKE 1 TABLET BY MOUTH EVERYDAY AT BEDTIME 05/30/19   Midge Minium, MD    Allergies    Lisinopril and Daypro [oxaprozin]  Review of Systems   Review of Systems  Constitutional: Negative for fever.  HENT: Negative for sore throat.   Respiratory: Negative for shortness of breath.   Cardiovascular: Negative for chest pain.  Gastrointestinal: Negative for abdominal pain, diarrhea, nausea and vomiting.  Musculoskeletal: Negative for back pain.  Neurological: Positive for tremors.  All other systems reviewed and are negative.   Physical Exam Updated Vital Signs BP (!) 153/98   Pulse 87   Temp 99 F (37.2 C)   Resp 18   SpO2 94%   Physical Exam Vitals and nursing note reviewed.  Constitutional:      Appearance: Normal appearance.  HENT:     Head: Normocephalic and atraumatic.     Nose: Nose normal.     Mouth/Throat:     Mouth: Mucous membranes are moist.  Eyes:     Pupils: Pupils are equal, round, and reactive to light.  Cardiovascular:     Rate and Rhythm: Normal rate.  Pulmonary:      Effort: Pulmonary effort is normal.  Breath sounds: No wheezing or rales.  Abdominal:     General: Abdomen is flat.     Tenderness: There is no abdominal tenderness.  Musculoskeletal:     Cervical back: Normal range of motion and neck supple.  Skin:    General: Skin is warm and dry.  Neurological:     Mental Status: She is alert and oriented to person, place, and time.     Motor: Tremor present. No atrophy, abnormal muscle tone or seizure activity.     Comments: No facial asymmetry, no dysarthria.  Sensation is intact throughout all upper and lower extremities.  Tremor NOT at rest, seems to be purposeful worsen on BLUE.      ED Results / Procedures / Treatments   Labs (all labs ordered are listed, but only abnormal results are displayed) Labs Reviewed  CBC - Abnormal; Notable for the following components:      Result Value   WBC 13.7 (*)    RBC 3.09 (*)    Hemoglobin 9.4 (*)    HCT 30.8 (*)    RDW 16.7 (*)    All other components within normal limits  BASIC METABOLIC PANEL - Abnormal; Notable for the following components:   Chloride 96 (*)    Glucose, Bld 132 (*)    BUN 25 (*)    Creatinine, Ser 7.39 (*)    GFR calc non Af Amer 5 (*)    GFR calc Af Amer 6 (*)    All other components within normal limits  AMMONIA  RAPID URINE DRUG SCREEN, HOSP PERFORMED  URINALYSIS, ROUTINE W REFLEX MICROSCOPIC    EKG None  Radiology DG Chest 2 View  Result Date: 07/28/2019 CLINICAL DATA:  Shortness of breath EXAM: CHEST - 2 VIEW COMPARISON:  06/01/2018 FINDINGS: Heart size is upper limits of normal. Mild bilateral perihilar and bibasilar interstitial prominence. Low lung volumes. No focal airspace consolidation. No pleural effusion or pneumothorax. IMPRESSION: Mild bilateral perihilar and bibasilar interstitial prominence which may reflect mild interstitial edema. Electronically Signed   By: Davina Poke D.O.   On: 07/28/2019 16:17    Procedures Procedures (including  critical care time)  Medications Ordered in ED Medications - No data to display  ED Course  I have reviewed the triage vital signs and the nursing notes.  Pertinent labs & imaging results that were available during my care of the patient were reviewed by me and considered in my medical decision making (see chart for details).    MDM Rules/Calculators/A&P   Patient with a past medical history of ESRD on dialysis Monday, Wednesday, Friday last dialyzed yesterday and completed treatment along with extra dialysis presents to the ED with complaints of tremors, reports this originated last night, these have happened in the past and she reports the gabapentin usually causes these tremors, although she has been on this medication for quite some time according to her records at least 1 year.  During evaluation patient has no focal neuro deficit, does have a essential tremor which is exacerbated with having her reach onto collect an object, does seem worse on bilateral upper extremities.  She is able to do finger-to-nose, unable to fully extend arms in front of her.  Lungs are clear to auscultation, abdomen is soft and nontender to palpation.  Moves all extremities.  4: 07 PM Curbside consultation with Dr. Aldean Jewett of neurology, who recommended gabapentin at a higher dose will be likely culprit of patient's symptoms with the tremor.  Hold gabapentin  for a day, reevaluate symptoms.  Once this medication is restarted he should be started not on a 3 times daily basis but Neurontin at bedtime.  Patient is currently using gabapentin for her seizures, will need a loading dose of Depakote of 20 mg/kg grams then received likely oral.  Interpretation of labs show a normal ammonia.  BMP without any electrolyte derangement, creatinine level is elevated however she did have dialysis yesterday and has a normal potassium.  CBC with a mild leukocytosis of 13.7.  Chest x-ray as she voiced an episode of shortness of breath  since yesterday:  Mild bilateral perihilar and bibasilar interstitial prominence which  may reflect mild interstitial edema.    7:12 PM Spoke to hospitalist service who will admit patient for further management. Patient stable for admission. No UA obtained, she does report making still very small urine however did not defer admission as she has no urinary complaints.   Portions of this note were generated with Lobbyist. Dictation errors may occur despite best attempts at proofreading.  Final Clinical Impression(s) / ED Diagnoses Final diagnoses:  Tremor    Rx / DC Orders ED Discharge Orders    None       Janeece Fitting, PA-C 07/28/19 [place], [place], DO 07/28/19 2226

## 2019-07-28 NOTE — ED Notes (Signed)
PA Caccavale in triage to assess for appropriate orders.

## 2019-07-28 NOTE — ED Provider Notes (Signed)
MSE was initiated and I personally evaluated the patient and placed orders (if any) at  12:24 PM on Jul 28, 2019.  Patient presenting for evaluation of tremors of bilateral hands that began when she woke up this morning.  She also states she is having tremors of her feet.  She has a history of focal seizures, states this is similar.  She supposed to be on medicine for her seizures, but is not taking it (because it makes her jerk when she takes it).  She has not followed up with her neurologist about this.  No trauma or injury.  No fevers.  She is ESRD on dialysis, goes Monday, Wednesday, Friday.  Last session was yesterday, was normal.  On exam, patient has bilateral upper extremity tremor noted when arms are extended, however no tremor at rest.  Will have nurse draw labs, and have patient removed sooner rather than later for evaluation of possible focal seizure.  The patient appears stable so that the remainder of the MSE may be completed by another provider.   Franchot Heidelberg, PA-C 07/28/19 1225    Wyvonnia Dusky, MD 07/28/19 [phone]

## 2019-07-28 NOTE — H&P (Signed)
History and Physical        Hospital Admission Note Date: 07/28/2019  Patient name: [name] [name] Medical record number: [id number] Date of birth: 02-23-1956 Age: 64 y.o. Gender: female  PCP: Midge Minium, MD    Patient coming from: Home via EMS   I have reviewed all records in the [facility].    Chief Complaint:  Tremors   HPI: [name] [name] is a 64 y.o. female with PMH of CAD, ESRD on HD, and reported seizure disorder on Gabapentin 300 mg TID who presents to the ER for tremors since last night. Patient has had tremors/shaking with Gabapentin in the past. By the time this provider evaluated the patient, she reported she was feeling well and shakiness has resolved. She reports compliance with HD. Last treatment was yesterday and she is scheduled for tomorrow.    ED work-up/course:  Patient with a past medical history of ESRD on dialysis Monday, Wednesday, Friday last dialyzed yesterday and completed treatment along with extra dialysis presents to the ED with complaints of tremors, reports this originated last night, these have happened in the past and she reports the gabapentin usually causes these tremors, although she has been on this medication for quite some time according to her records at least 1 year.  During evaluation patient has no focal neuro deficit, does have a essential tremor which is exacerbated with having her reach onto collect an object, does seem worse on bilateral upper extremities.  She is able to do finger-to-nose, unable to fully extend arms in front of her.  Lungs are clear to auscultation, abdomen is soft and nontender to palpation.  Moves all extremities.  4: 07 PM Curbside consultation with Dr. Aldean Jewett of neurology, who recommended gabapentin at a higher dose will be likely culprit of patient's symptoms with the tremor.  Hold gabapentin for a day, reevaluate symptoms.  Once this  medication is restarted he should be started not on a 3 times daily basis but Neurontin at bedtime.  Patient is currently using gabapentin for her seizures, will need a loading dose of Depakote of 20 mg/kg grams then received likely oral.  Interpretation of labs show a normal ammonia.  BMP without any electrolyte derangement, creatinine level is elevated however she did have dialysis yesterday and has a normal potassium.  CBC with a mild leukocytosis of 13.7.  Chest x-ray as she voiced an episode of shortness of breath since yesterday:  Mild bilateral perihilar and bibasilar interstitial prominence which  may reflect mild interstitial edema.    7:12 PM Spoke to hospitalist service who will admit patient for further management. Patient stable for admission. No UA obtained, she does report making still very small urine however did not defer admission as she has no urinary complaints.    Review of Systems: Positives marked in 'bold' Constitutional: Denies fever, chills, diaphoresis, poor appetite and fatigue.  HEENT: Denies photophobia, eye pain, redness, hearing loss, ear pain, congestion, sore throat, rhinorrhea, sneezing, mouth sores, trouble swallowing, neck pain, neck stiffness and tinnitus.   Respiratory: Denies SOB, DOE, cough, chest tightness,  and wheezing.   Cardiovascular: Denies chest pain, palpitations and leg swelling.  Gastrointestinal: Denies nausea, vomiting, abdominal pain, diarrhea, constipation, blood in stool and abdominal distention.  Genitourinary: Denies dysuria, urgency, frequency, hematuria, flank pain and difficulty urinating.  Musculoskeletal: Denies myalgias, back pain, joint swelling, arthralgias and gait problem.  Skin: Denies pallor, rash and wound.  Neurological: Denies dizziness, seizures, syncope, weakness, light-headedness, numbness and headaches.  Hematological: Denies adenopathy. Easy bruising, personal or family bleeding history  Psychiatric/Behavioral:  Denies suicidal ideation, mood changes, confusion, nervousness, sleep disturbance and agitation  Past Medical History: Past Medical History:  Diagnosis Date  . Adenomatous colon polyp 04/1998  . Anemia   . Anxiety   . Arthritis   . Carotid artery disease ([place])    Carotid US 1/18: bilateral ICA 1-39, R thyroid lobe nodule (1.9x2.2x3cm); numerous L thyroid lobe nodules - repeat 1 year  . Chronic kidney disease    M/W/[facility]  . Chronic renal failure    post transplant  . Depression   . Diabetes mellitus    diet controlled  . EBV infection    Per [facility]  . Encephalitis    NMDA per [facility]  . Esophagitis    Grade 1 Distal  . Focal seizure ([facility])    .  Last one 06/2016  . GERD (gastroesophageal reflux disease)   . Hemorrhoids   . History of blood transfusion   . History of kidney stones    Passed  . History of subdural hematoma   . Hx of cardiovascular stress test    Lexiscan Myoview (06/2013):  No ischemia, EF 66%; normal.  //  Myoview 12/17: EF 62, no ischemia or scar; Normal  . Hx of echocardiogram    a. Echocardiogram (06/2013):  Mod focal basal hypertrophy, EF 60-65%, normal wall motion, Gr 1 DD, mild AI, mildly dilated ascending aorta (41 mm), mild LAE.; b.  Echo 9/16: mod LVH, EF 60-65%, no RWMA, Gr 1 DD, trivial AI, mild dilated ascending aorta, mild LAE  . Hyperkalemia   . Hyperlipidemia   . Hypertension   . Hypomagnesemia   . Left jugular vein thrombosis    Partial resolved per [facility]  . Metabolic acidosis   . Pneumonia   . Right jugular vein thrombosis    resolved from [facility]  . Seizures ([place])     Past Surgical History:  Procedure Laterality Date  . A/V FISTULAGRAM Left 06/04/2017   Procedure: A/V FISTULAGRAM;  Surgeon: Conrad [place] Ridge, MD;  Location: [place] [facility];  Service: Cardiovascular;  Laterality: Left;  . A/V FISTULAGRAM N/A 03/24/2019   Procedure: A/V FISTULAGRAM - left arm;  Surgeon: [name], MD;  Location: [place] [facility];  Service:  Cardiovascular;  Laterality: N/A;  . ABDOMINAL HYSTERECTOMY    . AV FISTULA PLACEMENT  07/04/2005   [place] AV fistula  . AV FISTULA PLACEMENT  08/27/2005  . AV FISTULA PLACEMENT Left 04/29/2017   Procedure: Creation of Left Arm ARTERIOVENOUS BRACHIOCEPHALIC FISTULA;  Surgeon: Elam Dutch, MD;  Location: [place];  Service: Vascular;  Laterality: Left;  . AV FISTULA PLACEMENT Left 06/16/2017   Procedure: BRACHIO_BASCILIC ARTERIOVENOUS (AV) FISTULA CREATION;  Surgeon: Waynetta Sandy, MD;  Location: [place];  Service: Vascular;  Laterality: Left;  . AV FISTULA PLACEMENT W/ PTFE  08/27/2005  . BASCILIC VEIN TRANSPOSITION Left 06/16/2017   Procedure: BRACHIOCEPHALIC TRANSPOSITION  LEFT ARM;  Surgeon: Waynetta Sandy, MD;  Location: [place];  Service: Vascular;  Laterality: Left;  . BASCILIC VEIN TRANSPOSITION Left 09/01/2017   Procedure: BASILIC  VEIN TRANSPOSITION SECOND STAGE;  Surgeon: Waynetta Sandy, MD;  Location: [place];  Service: Vascular;  Laterality: Left;  . BRAIN BIOPSY    . CESAREAN SECTION    . COLONOSCOPY W/ POLYPECTOMY    . DG AV DIALYSIS GRAFT DECLOT OR  07/24/2005   AV Gore-Tex graf  . DG AV DIALYSIS GRAFT DECLOT OR  Thrombosis right forearm, loop arteriovenous   Thrombosis right forearm, loop arteriovenous graft  . GASTROSTOMY TUBE PLACEMENT    . KIDNEY TRANSPLANT  2009   Both  . PERIPHERAL VASCULAR BALLOON ANGIOPLASTY Left 03/24/2019   Procedure: PERIPHERAL VASCULAR BALLOON ANGIOPLASTY;  Surgeon: [name], MD;  Location: [place] [facility];  Service: Cardiovascular;  Laterality: Left;  left AV fistula  . REMOVAL OF GASTROSTOMY TUBE    . REVISON OF ARTERIOVENOUS FISTULA Left 11/23/2018   Procedure: REVISION PLICATION OF ARTERIOVENOUS FISTULA;  Surgeon: Waynetta Sandy, MD;  Location: [place];  Service: Vascular;  Laterality: Left;  . THROMBECTOMY / ARTERIOVENOUS GRAFT REVISION  10/12/2006  . THROMBECTOMY / ARTERIOVENOUS GRAFT  REVISION  10/16/2006    Medications: Prior to Admission medications   Medication Sig Start Date End Date Taking? Authorizing Provider  acetaminophen (TYLENOL) 650 MG CR tablet Take 650-1,300 mg by mouth every 8 (eight) hours as needed for pain.    Yes [provider]  albuterol (VENTOLIN HFA) 108 (90 Base) MCG/ACT inhaler INHALE 2 PUFFS INTO THE LUNGS EVERY 4 (FOUR) HOURS AS NEEDED FOR WHEEZING OR SHORTNESS OF BREATH. 03/31/19  Yes Midge Minium, MD  amLODipine (NORVASC) 10 MG tablet TAKE 1 TABLET BY MOUTH EVERY DAY Patient taking differently: Take 10 mg by mouth daily.  03/08/19  Yes Midge Minium, MD  atorvastatin (LIPITOR) 40 MG tablet Take 1 tablet (40 mg total) by mouth daily. 07/06/19  Yes Nahser, Wonda Cheng, MD  blood glucose meter kit and supplies KIT Dispense based on patient and insurance preference. Patient should test sugars twice daily as directed. Dx. E11.9 12/10/18  Yes Midge Minium, MD  cetirizine (ZYRTEC) 10 MG tablet TAKE 1 TABLET BY MOUTH EVERY DAY Patient taking differently: Take 10 mg by mouth daily.  07/27/19  Yes Midge Minium, MD  ferric citrate (AURYXIA) 1 GM 210 MG(Fe) tablet Take 210 mg by mouth See admin instructions. Take 210 mg by mouth three times a day with meals and 210 mg two times a day with snacks   Yes [provider]  gabapentin (NEURONTIN) 100 MG capsule TAKE 1 CAPSULE (100 MG TOTAL) BY MOUTH AT BEDTIME. 05/30/19  Yes Midge Minium, MD  glucose blood (ONETOUCH ULTRA) test strip Use as instructed to test sugars twice daily. Dx. E11.9 03/29/19  Yes Midge Minium, MD  HYDROcodone-acetaminophen (NORCO/VICODIN) 5-325 MG tablet Take 1 tablet by mouth every 6 (six) hours as needed for moderate pain.  04/12/19  Yes [provider]  isosorbide dinitrate (ISORDIL) 20 MG tablet TAKE 1 TABLET BY MOUTH THREE TIMES A DAY Patient taking differently: Take 20 mg by mouth 3 (three) times daily.  02/02/19  Yes Midge Minium, MD  loratadine (CLARITIN) 10 MG tablet Take 10 mg by mouth daily as needed for allergies.   Yes [provider]  meclizine (ANTIVERT) 25 MG tablet TAKE 1 TABLET (25 MG TOTAL) BY MOUTH 3 (THREE) TIMES DAILY AS NEEDED FOR DIZZINESS. 05/02/19  Yes Midge Minium, MD  multivitamin (RENA-VIT) TABS tablet Take 1 tablet by mouth every morning.  03/20/17  Yes [provider]  nitroGLYCERIN (NITROSTAT) 0.4 MG SL tablet Place 0.4 mg under the tongue every 5 (five) minutes as needed for chest pain.  06/16/13  Yes [provider]  omeprazole (PRILOSEC) 40 MG capsule TAKE 1 CAPSULE BY MOUTH EVERY DAY Patient taking differently: Take 40 mg by mouth daily.  05/09/19  Yes Midge Minium, MD  QUEtiapine (SEROQUEL) 100 MG tablet TAKE 1 TABLET BY MOUTH EVERYDAY AT BEDTIME Patient taking differently: Take 100 mg by mouth at bedtime.  05/30/19  Yes Midge Minium, MD  gabapentin (NEURONTIN) 300 MG capsule TAKE 1 CAPSULE BY MOUTH THREE TIMES A DAY Patient not taking: Reported on 07/28/2019 05/02/19   Midge Minium, MD    Allergies:   Allergies  Allergen Reactions  . Lisinopril Shortness Of Breath, Swelling and Other (See Comments)    Throat irritation also. Patient takes losartan and tolerates fine.  [name] [Oxaprozin] Hives and Dermatitis    Social History:  reports that she has quit smoking. She has never used smokeless tobacco. She reports that she does not drink alcohol or use drugs.  Family History: Family History  Problem Relation Age of Onset  . Kidney disease Paternal Aunt   . Heart disease Mother   . Heart disease Father   . Colon cancer Neg Hx     Physical Exam: Blood pressure (!) 126/95, pulse 77, temperature 99 F (37.2 C), resp. rate (!) 8, SpO2 100 %. General: Alert, awake, oriented x3, in no acute distress. Eyes: pink conjunctiva,anicteric sclera, pupils equal and reactive to light and accomodation, HEENT: normocephalic, atraumatic,  oropharynx clear Neck: supple, no masses or lymphadenopathy, no goiter, no bruits, no JVD CVS: Regular rate and rhythm, without murmurs, rubs or gallops. No lower extremity edema Resp : Clear to auscultation bilaterally, no wheezing, rales or rhonchi. GI : Soft, nontender, nondistended, positive bowel sounds, no masses. No hepatomegaly. No hernia.  Musculoskeletal: No clubbing or cyanosis, positive pedal pulses. No contracture. ROM intact  Neuro: Grossly intact, no focal neurological deficits, strength 5/5 upper and lower extremities bilaterally. No tremor present at rest, tremor exacerbated with movement present bilaterally in UE.  Psych: alert and oriented x 3, normal mood and affect Skin: no rashes or lesions, warm and dry   LABS on Admission: I have personally reviewed all the labs and imagings below    Basic Metabolic Panel: Recent Labs  Lab 07/28/19 1233  NA 139  K 4.7  CL 96*  CO2 31  GLUCOSE 132*  BUN 25*  CREATININE 7.39*  CALCIUM 9.1   Liver Function Tests: No results for input(s): AST, ALT, ALKPHOS, BILITOT, PROT, ALBUMIN in the last 168 hours. No results for input(s): LIPASE, AMYLASE in the last 168 hours. Recent Labs  Lab 07/28/19 1632  AMMONIA 20   CBC: Recent Labs  Lab 07/28/19 1233  WBC 13.7*  HGB 9.4*  HCT 30.8*  MCV 99.7  PLT 218   Cardiac Enzymes: No results for input(s): CKTOTAL, CKMB, CKMBINDEX, TROPONINI in the last 168 hours. BNP: Invalid input(s): POCBNP CBG: No results for input(s): GLUCAP in the last 168 hours.  Radiological Exams on Admission:  DG Chest 2 View  Result Date: 07/28/2019 CLINICAL DATA:  Shortness of breath EXAM: CHEST - 2 VIEW COMPARISON:  06/01/2018 FINDINGS: Heart size is upper limits of normal. Mild bilateral perihilar and bibasilar interstitial prominence. Low lung volumes. No focal airspace consolidation. No pleural effusion or pneumothorax. IMPRESSION: Mild bilateral perihilar and bibasilar  interstitial prominence  which may reflect mild interstitial edema. Electronically Signed   By: Davina Poke D.O.   On: 07/28/2019 16:17      EKG: Independently reviewed.    Assessment/Plan Active Problems:   Essential hypertension   ESRD on dialysis [facility])   History of renal transplant   Hyperlipidemia   Diabetes mellitus type II, controlled ([place])   Tumor   History of seizures   Tremor   Tremor with History of Seizures  Seems to have an essential tremor bilaterally. ED provider spoke with neurology who suspected this may be related to Gabapentin toxicity in the setting of ESRD and recommended observation overnight and initiation of Depakote. Electrolytes and ammonia within normal limits. BUN mildly elevated. Patient has been compliant with HD.  -admit to telemetry with continuous pulse ox  -neurology has given recs, appreciate--?formal consult in AM for follow up plans  -hold gabapentin for wash out  -spoke with pharmacy for recs regarding Valproate: recommended 1250 mg liquid PO for loading dose followed by 500 mg tablets PO q12 hours starting tomorrow; adjust dose for seizure prophylaxis as appropriate  -neuro checks q1h x12 hours then routine   ESRD on HD: MWF. Cr 7.39/BUN 25/GFR 6 at admit.  -will need to arrange for HD tomorrow prior to d/c   HTN BPs normotensive to slightly elevated.  -continue Amlodipine   DVT prophylaxis: Heparin   CODE STATUS: FULL   Consults called: None (Neuro-curbside consult)   Family Communication: Admission, patients condition and plan of care including tests being ordered have been discussed with the patient who indicates understanding and agree with the plan and Code Status  Admission status: Observation   The medical decision making on this patient was of high complexity and the patient is at high risk for clinical deterioration, therefore this is a level 3 admission.  Severity of Illness:     Mild  The appropriate patient status for this patient is  OBSERVATION. Observation status is judged to be reasonable and necessary in order to provide the required intensity of service to ensure the patient's safety. The patient's presenting symptoms, physical exam findings, and initial radiographic and laboratory data in the context of their medical condition is felt to place them at decreased risk for further clinical deterioration. Furthermore, it is anticipated that the patient will be medically stable for discharge from the hospital within 2 midnights of admission. The following factors support the patient status of observation.   " The patient's presenting symptoms include tremor/shakiness. " The physical exam findings include essential tremor. " The initial radiographic and laboratory data are labs consistent with ESRD.     Time Spent on Admission: 42 minutes      Melina Schools D.O.  [facility] 07/28/2019, 8:37 PM

## 2019-07-29 ENCOUNTER — Other Ambulatory Visit: Payer: Self-pay

## 2019-07-29 DIAGNOSIS — N186 End stage renal disease: Secondary | ICD-10-CM | POA: Diagnosis not present

## 2019-07-29 DIAGNOSIS — E1122 Type 2 diabetes mellitus with diabetic chronic kidney disease: Secondary | ICD-10-CM

## 2019-07-29 DIAGNOSIS — I120 Hypertensive chronic kidney disease with stage 5 chronic kidney disease or end stage renal disease: Secondary | ICD-10-CM | POA: Diagnosis not present

## 2019-07-29 DIAGNOSIS — Z992 Dependence on renal dialysis: Secondary | ICD-10-CM | POA: Diagnosis not present

## 2019-07-29 LAB — URINALYSIS, ROUTINE W REFLEX MICROSCOPIC
Bilirubin Urine: NEGATIVE
Glucose, UA: NEGATIVE mg/dL
Hgb urine dipstick: NEGATIVE
Ketones, ur: NEGATIVE mg/dL
Nitrite: NEGATIVE
Protein, ur: 100 mg/dL — AB
Specific Gravity, Urine: 1.018 (ref 1.005–1.030)
WBC, UA: >50 WBC/hpf — ABNORMAL HIGH (ref 0–5)
pH: 6 (ref 5.0–8.0)

## 2019-07-29 LAB — GLUCOSE, CAPILLARY: Glucose-Capillary: 109 mg/dL — ABNORMAL HIGH (ref 70–99)

## 2019-07-29 LAB — CBC
HCT: 26.3 % — ABNORMAL LOW (ref 36.0–46.0)
Hemoglobin: 8.2 g/dL — ABNORMAL LOW (ref 12.0–15.0)
MCH: 30.8 pg (ref 26.0–34.0)
MCHC: 31.2 g/dL (ref 30.0–36.0)
MCV: 98.9 fL (ref 80.0–100.0)
Platelets: 181 10*3/uL (ref 150–400)
RBC: 2.66 MIL/uL — ABNORMAL LOW (ref 3.87–5.11)
RDW: 16.7 % — ABNORMAL HIGH (ref 11.5–15.5)
WBC: 10 10*3/uL (ref 4.0–10.5)
nRBC: 0 % (ref 0.0–0.2)

## 2019-07-29 LAB — BASIC METABOLIC PANEL
Anion gap: 9 (ref 5–15)
BUN: 36 mg/dL — ABNORMAL HIGH (ref 8–23)
CO2: 32 mmol/L (ref 22–32)
Calcium: 8.7 mg/dL — ABNORMAL LOW (ref 8.9–10.3)
Chloride: 98 mmol/L (ref 98–111)
Creatinine, Ser: 9.14 mg/dL — ABNORMAL HIGH (ref 0.44–1.00)
GFR calc Af Amer: 5 mL/min — ABNORMAL LOW (ref >60)
GFR calc non Af Amer: 4 mL/min — ABNORMAL LOW (ref >60)
Glucose, Bld: 100 mg/dL — ABNORMAL HIGH (ref 70–99)
Potassium: 4.5 mmol/L (ref 3.5–5.1)
Sodium: 139 mmol/L (ref 135–145)

## 2019-07-29 LAB — RESPIRATORY PANEL BY RT PCR (FLU A&B, COVID)
Influenza A by PCR: NEGATIVE
Influenza B by PCR: NEGATIVE
SARS Coronavirus 2 by RT PCR: NEGATIVE

## 2019-07-29 LAB — RAPID URINE DRUG SCREEN, HOSP PERFORMED
Amphetamines: NOT DETECTED
Barbiturates: NOT DETECTED
Benzodiazepines: NOT DETECTED
Cocaine: NOT DETECTED
Opiates: NOT DETECTED
Tetrahydrocannabinol: NOT DETECTED

## 2019-07-29 LAB — HIV ANTIBODY (ROUTINE TESTING W REFLEX): HIV Screen 4th Generation wRfx: NONREACTIVE

## 2019-07-29 MED ORDER — GABAPENTIN 100 MG PO CAPS: 100.0000 mg | ORAL_CAPSULE | Freq: Every day | ORAL | Status: DC

## 2019-07-29 MED ORDER — CHLORHEXIDINE GLUCONATE CLOTH 2 % EX PADS
6.0000 | MEDICATED_PAD | Freq: Every day | CUTANEOUS | Status: DC
  Administered 2019-07-29: 6 via TOPICAL

## 2019-07-29 MED ORDER — ACETAMINOPHEN 325 MG PO TABS
ORAL_TABLET | ORAL | Status: AC
  Administered 2019-07-29: 650 mg via ORAL
  Filled 2019-07-29: qty 2

## 2019-07-29 MED ORDER — HEPARIN SODIUM (PORCINE) 1000 UNIT/ML DIALYSIS: 2000.0000 [IU] | Freq: Once | INTRAMUSCULAR | Status: DC

## 2019-07-29 MED ORDER — CHLORHEXIDINE GLUCONATE CLOTH 2 % EX PADS: 6.0000 | MEDICATED_PAD | Freq: Every day | CUTANEOUS | Status: DC

## 2019-07-29 NOTE — Progress Notes (Addendum)
Asked to see for HD today.  Pt is MWF patient admitted for jerking of extremities, thought due to Neurontin. Seen by neuro and Neurontin held. NH3 wnl. Pt is no distress. CXR showing early IS edema. Exam is neg for vol overload. Will plan HD today 2nd shift, this afternoon, get vol down and lower dry wt.    MWF [place] 3h 71min   83kg   Hep 2000   AVF   If pt is changed to full admit status will do formal consultation.   Kelly Splinter, MD 07/29/2019, 9:15 AM

## 2019-07-29 NOTE — Procedures (Signed)
   I was present at this dialysis session, have reviewed the session itself and made  appropriate changes Kelly Splinter MD [place] pager [phone]   07/29/2019, 2:01 PM

## 2019-07-29 NOTE — Progress Notes (Signed)
SATURATION QUALIFICATIONS:   Patient Saturations on Room Air at Rest = 85%  Patient Saturations on Room Air while Ambulating = 83-88%  Patient Saturations on 2 Liters of oxygen while Ambulating = 90-92%

## 2019-07-29 NOTE — Progress Notes (Signed)
SATURATION QUALIFICATIONS: POST DIALYSIS  Patient Saturations on Room Air at Rest = 95%  Patient Saturations on Room Air while Ambulating = 88-92%

## 2019-07-29 NOTE — Progress Notes (Signed)
Informed from [facility] on 3 [place] patient discharged without medication for new dosage of gabapentin.   Medication gabapentin 100 mg capsule to take one capsule at bedtime called into [facility] at [phone] for 30 capsules

## 2019-07-29 NOTE — Discharge Summary (Signed)
Physician Discharge Summary  [name] [name] ZCH:[id number] DOB: February 07, 1956 DOA: 07/28/2019  PCP: Midge Minium, MD  Admit date: 07/28/2019 Discharge date: 07/29/2019  Admitted From: Home Disposition: Home  Recommendations for Outpatient Follow-up:  1. Follow up with PCP in 1-2 weeks 2. Go to regular dialysis session on Monday.  Home Health: Not applicable Equipment/Devices: None needed  Discharge Condition: Stable CODE STATUS: Full code Diet recommendation: Low-salt diet  Discharge summary:  64 year old female with history of coronary artery disease, ESRD on hemodialysis with Monday Wednesday and Friday schedule, peripheral neuropathy on gabapentin recently increased dose 300 mg 3 times daily came to the emergency room with increasing tremors.  She does have history of seizure that was related to one of the hospitalization for acute illness but she is not on any seizure medicine.  In the emergency room, due to significant symptoms on presentation she was evaluated and admitted to the hospital.  Her findings were normal.  She did not have any neurological deficits.  Shakiness/tremors/tremulous: Probably due to higher level of gabapentin.  Patient stated that she has been taking 300 mg 3 times a day.  Patient does have history of seizure exacerbated by acute illness but no recurrent seizure and she is not on treatment for seizure or not taking gabapentin for seizure. After hemodialysis, her all symptoms improved.  We resumed her gabapentin 100 mg daily to take at night. Patient also had low oxygen level before going to hemodialysis that improved.  Now she is ambulated in the hallway with oxygen saturations 90-92%.  She does have some congestion that also improved with hemodialysis, she will take some over-the-counter cough medications.  She was examined after coming back from dialysis and improvement of clinical symptoms, patient is eager to go home so discharged her home.  Discharge  Diagnoses:  Active Problems:   Essential hypertension   ESRD on dialysis [facility])   History of renal transplant   Hyperlipidemia   Diabetes mellitus type II, controlled ([place])   Tumor   History of seizures   Tremor    Discharge Instructions  Discharge Instructions    Diet - low sodium heart healthy   Complete by: As directed    Discharge instructions   Complete by: As directed    Can take over the counter cough medications  You will able to take only 100 mg of gabapentin daily at night   Increase activity slowly   Complete by: As directed      Allergies as of 07/29/2019      Reactions   Lisinopril Shortness Of Breath, Swelling, Other (See Comments)   Throat irritation also. Patient takes losartan and tolerates fine.   Daypro [oxaprozin] Hives, Dermatitis      Medication List    TAKE these medications   acetaminophen 650 MG CR tablet Commonly known as: TYLENOL Take 650-1,300 mg by mouth every 8 (eight) hours as needed for pain.   albuterol 108 (90 Base) MCG/ACT inhaler Commonly known as: VENTOLIN HFA INHALE 2 PUFFS INTO THE LUNGS EVERY 4 (FOUR) HOURS AS NEEDED FOR WHEEZING OR SHORTNESS OF BREATH.   amLODipine 10 MG tablet Commonly known as: NORVASC TAKE 1 TABLET BY MOUTH EVERY DAY   atorvastatin 40 MG tablet Commonly known as: LIPITOR Take 1 tablet (40 mg total) by mouth daily.   Auryxia 1 GM 210 MG(Fe) tablet Generic drug: ferric citrate Take 210 mg by mouth See admin instructions. Take 210 mg by mouth three times a day with meals and  210 mg two times a day with snacks   blood glucose meter kit and supplies Kit Dispense based on patient and insurance preference. Patient should test sugars twice daily as directed. Dx. E11.9   cetirizine 10 MG tablet Commonly known as: ZYRTEC TAKE 1 TABLET BY MOUTH EVERY DAY   gabapentin 100 MG capsule Commonly known as: NEURONTIN TAKE 1 CAPSULE (100 MG TOTAL) BY MOUTH AT BEDTIME. What changed: Another medication with the  same name was removed. Continue taking this medication, and follow the directions you see here.   HYDROcodone-acetaminophen 5-325 MG tablet Commonly known as: NORCO/VICODIN Take 1 tablet by mouth every 6 (six) hours as needed for moderate pain.   isosorbide dinitrate 20 MG tablet Commonly known as: ISORDIL TAKE 1 TABLET BY MOUTH THREE TIMES A DAY   loratadine 10 MG tablet Commonly known as: CLARITIN Take 10 mg by mouth daily as needed for allergies.   meclizine 25 MG tablet Commonly known as: ANTIVERT TAKE 1 TABLET (25 MG TOTAL) BY MOUTH 3 (THREE) TIMES DAILY AS NEEDED FOR DIZZINESS.   multivitamin Tabs tablet Take 1 tablet by mouth every morning.   nitroGLYCERIN 0.4 MG SL tablet Commonly known as: NITROSTAT Place 0.4 mg under the tongue every 5 (five) minutes as needed for chest pain.   omeprazole 40 MG capsule Commonly known as: PRILOSEC TAKE 1 CAPSULE BY MOUTH EVERY DAY What changed: how much to take   OneTouch Ultra test strip Generic drug: glucose blood Use as instructed to test sugars twice daily. Dx. E11.9   QUEtiapine 100 MG tablet Commonly known as: SEROQUEL TAKE 1 TABLET BY MOUTH EVERYDAY AT BEDTIME What changed: See the new instructions.       Allergies  Allergen Reactions  . Lisinopril Shortness Of Breath, Swelling and Other (See Comments)    Throat irritation also. Patient takes losartan and tolerates fine.  [name] [Oxaprozin] Hives and Dermatitis    Consultations:  Nephrology for dialysis   Procedures/Studies: DG Chest 2 View  Result Date: 07/28/2019 CLINICAL DATA:  Shortness of breath EXAM: CHEST - 2 VIEW COMPARISON:  06/01/2018 FINDINGS: Heart size is upper limits of normal. Mild bilateral perihilar and bibasilar interstitial prominence. Low lung volumes. No focal airspace consolidation. No pleural effusion or pneumothorax. IMPRESSION: Mild bilateral perihilar and bibasilar interstitial prominence which may reflect mild interstitial edema.  Electronically Signed   By: Davina Poke D.O.   On: 07/28/2019 16:17   XR Shoulder Left  Result Date: 07/19/2019 No acute or structural abnormalities    Subjective: Patient seen and examined in the morning rounds where she was having some cough and congestion. Seen and examined after she came back from dialysis and states all symptoms are improved.  She is eager to go home.   Discharge Exam: Vitals:   07/29/19 1537 07/29/19 1631  BP: (!) 156/84 (!) 151/84  Pulse: 78 73  Resp: 12 18  Temp: 98.7 F (37.1 C) 98.6 F (37 C)  SpO2: 98% (!) 82%   Vitals:   07/29/19 1330 07/29/19 1530 07/29/19 1537 07/29/19 1631  BP: (!) 169/95 (!) 157/93 (!) 156/84 (!) 151/84  Pulse: 79 100 78 73  Resp: '17 19 12 18  ' Temp:   98.7 F (37.1 C) 98.6 F (37 C)  TempSrc:   Oral Oral  SpO2: 97% 98% 98% (!) 82%  Weight:      Height:        General: Pt is alert, awake, not in acute distress Cardiovascular: RRR, S1/S2 +,  no rubs, no gallops Respiratory: CTA bilaterally, no wheezing, no rhonchi Abdominal: Soft, NT, ND, bowel sounds + Extremities: no edema, no cyanosis, left upper extremity dialysis fistula present.    The results of significant diagnostics from this hospitalization (including imaging, microbiology, ancillary and laboratory) are listed below for reference.     Microbiology: Recent Results (from the past 240 hour(s))  Respiratory Panel by RT PCR (Flu A&B, Covid) - Nasopharyngeal Swab     Status: None   Collection Time: 07/28/19 11:45 PM   Specimen: Nasopharyngeal Swab  Result Value Ref Range Status   SARS Coronavirus 2 by RT PCR NEGATIVE NEGATIVE Final    Comment: (NOTE) SARS-CoV-2 target nucleic acids are NOT DETECTED. The SARS-CoV-2 RNA is generally detectable in upper respiratoy specimens during the acute phase of infection. The lowest concentration of SARS-CoV-2 viral copies this assay can detect is 131 copies/mL. A negative result does not preclude  SARS-Cov-2 infection and should not be used as the sole basis for treatment or other patient management decisions. A negative result may occur with  improper specimen collection/handling, submission of specimen other than nasopharyngeal swab, presence of viral mutation(s) within the areas targeted by this assay, and inadequate number of viral copies (<131 copies/mL). A negative result must be combined with clinical observations, patient history, and epidemiological information. The expected result is Negative. Fact Sheet for Patients:  PinkCheek.be Fact Sheet for Healthcare Providers:  GravelBags.it This test is not yet ap proved or cleared by the [place] FDA and  has been authorized for detection and/or diagnosis of SARS-CoV-2 by FDA under an Emergency Use Authorization (EUA). This EUA will remain  in effect (meaning this test can be used) for the duration of the COVID-19 declaration under Section 564(b)(1) of the Act, 21 U.S.C. section 360bbb-3(b)(1), unless the authorization is terminated or revoked sooner.    Influenza A by PCR NEGATIVE NEGATIVE Final   Influenza B by PCR NEGATIVE NEGATIVE Final    Comment: (NOTE) The Xpert Xpress SARS-CoV-2/FLU/RSV assay is intended as an aid in  the diagnosis of influenza from Nasopharyngeal swab specimens and  should not be used as a sole basis for treatment. Nasal washings and  aspirates are unacceptable for Xpert Xpress SARS-CoV-2/FLU/RSV  testing. Fact Sheet for Patients: PinkCheek.be Fact Sheet for Healthcare Providers: GravelBags.it This test is not yet approved or cleared by the [place] FDA and  has been authorized for detection and/or diagnosis of SARS-CoV-2 by  FDA under an Emergency Use Authorization (EUA). This EUA will remain  in effect (meaning this test can be used) for the duration of the  Covid-19  declaration under Section 564(b)(1) of the Act, 21  U.S.C. section 360bbb-3(b)(1), unless the authorization is  terminated or revoked. Performed at [facility], [place] [address], [place], [place] [address]      Labs: BNP (last 3 results) No results for input(s): BNP in the last 8760 hours. Basic Metabolic Panel: Recent Labs  Lab 07/28/19 1233 07/29/19 0503  NA 139 139  K 4.7 4.5  CL 96* 98  CO2 31 32  GLUCOSE 132* 100*  BUN 25* 36*  CREATININE 7.39* 9.14*  CALCIUM 9.1 8.7*   Liver Function Tests: No results for input(s): AST, ALT, ALKPHOS, BILITOT, PROT, ALBUMIN in the last 168 hours. No results for input(s): LIPASE, AMYLASE in the last 168 hours. Recent Labs  Lab 07/28/19 1632  AMMONIA 20   CBC: Recent Labs  Lab 07/28/19 1233 07/29/19 0503  WBC 13.7*  10.0  HGB 9.4* 8.2*  HCT 30.8* 26.3*  MCV 99.7 98.9  PLT 218 181   Cardiac Enzymes: No results for input(s): CKTOTAL, CKMB, CKMBINDEX, TROPONINI in the last 168 hours. BNP: Invalid input(s): POCBNP CBG: Recent Labs  Lab 07/29/19 0612  GLUCAP 109*   D-Dimer No results for input(s): DDIMER in the last 72 hours. Hgb A1c No results for input(s): HGBA1C in the last 72 hours. Lipid Profile No results for input(s): CHOL, HDL, LDLCALC, TRIG, CHOLHDL, LDLDIRECT in the last 72 hours. Thyroid function studies No results for input(s): TSH, T4TOTAL, T3FREE, THYROIDAB in the last 72 hours.  Invalid input(s): FREET3 Anemia work up No results for input(s): VITAMINB12, FOLATE, FERRITIN, TIBC, IRON, RETICCTPCT in the last 72 hours. Urinalysis    Component Value Date/Time   COLORURINE AMBER (A) 07/29/2019 1155   APPEARANCEUR CLOUDY (A) 07/29/2019 1155   LABSPEC 1.018 07/29/2019 1155   PHURINE 6.0 07/29/2019 1155   GLUCOSEU NEGATIVE 07/29/2019 1155   HGBUR NEGATIVE 07/29/2019 1155   BILIRUBINUR NEGATIVE 07/29/2019 1155   BILIRUBINUR negative 07/23/2017 1536   KETONESUR NEGATIVE 07/29/2019 1155   PROTEINUR  100 (A) 07/29/2019 1155   UROBILINOGEN 0.2 07/23/2017 1536   UROBILINOGEN 0.2 09/16/2014 1330   NITRITE NEGATIVE 07/29/2019 1155   LEUKOCYTESUR LARGE (A) 07/29/2019 1155   Sepsis Labs Invalid input(s): PROCALCITONIN,  WBC,  LACTICIDVEN Microbiology Recent Results (from the past 240 hour(s))  Respiratory Panel by RT PCR (Flu A&B, Covid) - Nasopharyngeal Swab     Status: None   Collection Time: 07/28/19 11:45 PM   Specimen: Nasopharyngeal Swab  Result Value Ref Range Status   SARS Coronavirus 2 by RT PCR NEGATIVE NEGATIVE Final    Comment: (NOTE) SARS-CoV-2 target nucleic acids are NOT DETECTED. The SARS-CoV-2 RNA is generally detectable in upper respiratoy specimens during the acute phase of infection. The lowest concentration of SARS-CoV-2 viral copies this assay can detect is 131 copies/mL. A negative result does not preclude SARS-Cov-2 infection and should not be used as the sole basis for treatment or other patient management decisions. A negative result may occur with  improper specimen collection/handling, submission of specimen other than nasopharyngeal swab, presence of viral mutation(s) within the areas targeted by this assay, and inadequate number of viral copies (<131 copies/mL). A negative result must be combined with clinical observations, patient history, and epidemiological information. The expected result is Negative. Fact Sheet for Patients:  PinkCheek.be Fact Sheet for Healthcare Providers:  GravelBags.it This test is not yet ap proved or cleared by the [place] FDA and  has been authorized for detection and/or diagnosis of SARS-CoV-2 by FDA under an Emergency Use Authorization (EUA). This EUA will remain  in effect (meaning this test can be used) for the duration of the COVID-19 declaration under Section 564(b)(1) of the Act, 21 U.S.C. section 360bbb-3(b)(1), unless the authorization is terminated  or revoked sooner.    Influenza A by PCR NEGATIVE NEGATIVE Final   Influenza B by PCR NEGATIVE NEGATIVE Final    Comment: (NOTE) The Xpert Xpress SARS-CoV-2/FLU/RSV assay is intended as an aid in  the diagnosis of influenza from Nasopharyngeal swab specimens and  should not be used as a sole basis for treatment. Nasal washings and  aspirates are unacceptable for Xpert Xpress SARS-CoV-2/FLU/RSV  testing. Fact Sheet for Patients: PinkCheek.be Fact Sheet for Healthcare Providers: GravelBags.it This test is not yet approved or cleared by the [place] FDA and  has been authorized for detection and/or diagnosis of SARS-CoV-2  by  FDA under an Emergency Use Authorization (EUA). This EUA will remain  in effect (meaning this test can be used) for the duration of the  Covid-19 declaration under Section 564(b)(1) of the Act, 21  U.S.C. section 360bbb-3(b)(1), unless the authorization is  terminated or revoked. Performed at [facility], [place] [address] [place], [place] [address]      Time coordinating discharge: 30 minutes  SIGNED:   Barb Merino, MD  [facility] 07/29/2019, 4:38 PM

## 2019-07-29 NOTE — Progress Notes (Signed)
Pts oxygen saturations while ambulating on room air ranged from 83-88%. Pt currently resting in bed on 2LNC with O2 of 91%.

## 2019-07-30 ENCOUNTER — Telehealth: Payer: Self-pay | Admitting: Nephrology

## 2019-07-30 NOTE — Telephone Encounter (Signed)
Transition of Care Contact from [place]  Date of Discharge: 07/29/19 Date of Contact: 07/30/19 Method of contact: phone  Attempted to contact patient to discuss transition of care from inpatient admission.. Patient did not answer the phone.  Message was left on patient's voicemail informing them we would attempt to call them again and if unable to reach will follow up at dialysis.  Jen Mow, PA-C [facility] Pager: [phone]

## 2019-07-31 ENCOUNTER — Telehealth: Payer: Self-pay | Admitting: Nephrology

## 2019-07-31 NOTE — Telephone Encounter (Signed)
Transition of Care Contact from [place]  Date of Discharge: 07/29/19 Date of Contact: 07/31/19 Method of contact: phone  Attempted to contact patient to discuss transition of care from inpatient admission. Patient did not answer the phone.  Will attempt to call again and if unable to reach will follow up at dialysis.  Jen Mow, PA-C [facility] Pager: [phone]

## 2019-08-01 DIAGNOSIS — N2581 Secondary hyperparathyroidism of renal origin: Secondary | ICD-10-CM | POA: Diagnosis not present

## 2019-08-01 DIAGNOSIS — Z992 Dependence on renal dialysis: Secondary | ICD-10-CM | POA: Diagnosis not present

## 2019-08-01 DIAGNOSIS — D631 Anemia in chronic kidney disease: Secondary | ICD-10-CM | POA: Diagnosis not present

## 2019-08-01 DIAGNOSIS — N186 End stage renal disease: Secondary | ICD-10-CM | POA: Diagnosis not present

## 2019-08-01 DIAGNOSIS — E1129 Type 2 diabetes mellitus with other diabetic kidney complication: Secondary | ICD-10-CM | POA: Diagnosis not present

## 2019-08-01 DIAGNOSIS — D509 Iron deficiency anemia, unspecified: Secondary | ICD-10-CM | POA: Diagnosis not present

## 2019-08-01 DIAGNOSIS — D689 Coagulation defect, unspecified: Secondary | ICD-10-CM | POA: Diagnosis not present

## 2019-08-01 DIAGNOSIS — L299 Pruritus, unspecified: Secondary | ICD-10-CM | POA: Diagnosis not present

## 2019-08-03 ENCOUNTER — Other Ambulatory Visit: Payer: Self-pay | Admitting: Family Medicine

## 2019-08-03 DIAGNOSIS — Z992 Dependence on renal dialysis: Secondary | ICD-10-CM | POA: Diagnosis not present

## 2019-08-03 DIAGNOSIS — D509 Iron deficiency anemia, unspecified: Secondary | ICD-10-CM | POA: Diagnosis not present

## 2019-08-03 DIAGNOSIS — D689 Coagulation defect, unspecified: Secondary | ICD-10-CM | POA: Diagnosis not present

## 2019-08-03 DIAGNOSIS — L299 Pruritus, unspecified: Secondary | ICD-10-CM | POA: Diagnosis not present

## 2019-08-03 DIAGNOSIS — D631 Anemia in chronic kidney disease: Secondary | ICD-10-CM | POA: Diagnosis not present

## 2019-08-03 DIAGNOSIS — E1129 Type 2 diabetes mellitus with other diabetic kidney complication: Secondary | ICD-10-CM | POA: Diagnosis not present

## 2019-08-03 DIAGNOSIS — N186 End stage renal disease: Secondary | ICD-10-CM | POA: Diagnosis not present

## 2019-08-03 DIAGNOSIS — N2581 Secondary hyperparathyroidism of renal origin: Secondary | ICD-10-CM | POA: Diagnosis not present

## 2019-08-04 ENCOUNTER — Telehealth: Payer: Self-pay | Admitting: Family Medicine

## 2019-08-04 DIAGNOSIS — M79673 Pain in unspecified foot: Secondary | ICD-10-CM

## 2019-08-04 NOTE — Telephone Encounter (Signed)
Referral placed.

## 2019-08-04 NOTE — Telephone Encounter (Signed)
Referral has been sent to [facility]

## 2019-08-04 NOTE — Telephone Encounter (Signed)
Pt refused the podiatry referral in Aug of 2020, she would like to do the referral now can we re-enter the referral. Pt can be reached at the home #

## 2019-08-04 NOTE — Telephone Encounter (Signed)
Please advise 

## 2019-08-04 NOTE — Telephone Encounter (Signed)
Ok to re-refer to podiatry

## 2019-08-05 DIAGNOSIS — R251 Tremor, unspecified: Secondary | ICD-10-CM | POA: Diagnosis not present

## 2019-08-05 DIAGNOSIS — D689 Coagulation defect, unspecified: Secondary | ICD-10-CM | POA: Diagnosis not present

## 2019-08-05 DIAGNOSIS — D631 Anemia in chronic kidney disease: Secondary | ICD-10-CM | POA: Diagnosis not present

## 2019-08-05 DIAGNOSIS — N2581 Secondary hyperparathyroidism of renal origin: Secondary | ICD-10-CM | POA: Diagnosis not present

## 2019-08-05 DIAGNOSIS — E1129 Type 2 diabetes mellitus with other diabetic kidney complication: Secondary | ICD-10-CM | POA: Diagnosis not present

## 2019-08-05 DIAGNOSIS — Z992 Dependence on renal dialysis: Secondary | ICD-10-CM | POA: Diagnosis not present

## 2019-08-05 DIAGNOSIS — N186 End stage renal disease: Secondary | ICD-10-CM | POA: Diagnosis not present

## 2019-08-05 DIAGNOSIS — L299 Pruritus, unspecified: Secondary | ICD-10-CM | POA: Diagnosis not present

## 2019-08-05 DIAGNOSIS — D509 Iron deficiency anemia, unspecified: Secondary | ICD-10-CM | POA: Diagnosis not present

## 2019-08-08 DIAGNOSIS — D631 Anemia in chronic kidney disease: Secondary | ICD-10-CM | POA: Diagnosis not present

## 2019-08-08 DIAGNOSIS — N186 End stage renal disease: Secondary | ICD-10-CM | POA: Diagnosis not present

## 2019-08-08 DIAGNOSIS — L299 Pruritus, unspecified: Secondary | ICD-10-CM | POA: Diagnosis not present

## 2019-08-08 DIAGNOSIS — N2581 Secondary hyperparathyroidism of renal origin: Secondary | ICD-10-CM | POA: Diagnosis not present

## 2019-08-08 DIAGNOSIS — Z992 Dependence on renal dialysis: Secondary | ICD-10-CM | POA: Diagnosis not present

## 2019-08-08 DIAGNOSIS — D509 Iron deficiency anemia, unspecified: Secondary | ICD-10-CM | POA: Diagnosis not present

## 2019-08-08 DIAGNOSIS — E1129 Type 2 diabetes mellitus with other diabetic kidney complication: Secondary | ICD-10-CM | POA: Diagnosis not present

## 2019-08-08 DIAGNOSIS — D689 Coagulation defect, unspecified: Secondary | ICD-10-CM | POA: Diagnosis not present

## 2019-08-10 DIAGNOSIS — E1129 Type 2 diabetes mellitus with other diabetic kidney complication: Secondary | ICD-10-CM | POA: Diagnosis not present

## 2019-08-10 DIAGNOSIS — N186 End stage renal disease: Secondary | ICD-10-CM | POA: Diagnosis not present

## 2019-08-10 DIAGNOSIS — L299 Pruritus, unspecified: Secondary | ICD-10-CM | POA: Diagnosis not present

## 2019-08-10 DIAGNOSIS — N2581 Secondary hyperparathyroidism of renal origin: Secondary | ICD-10-CM | POA: Diagnosis not present

## 2019-08-10 DIAGNOSIS — D689 Coagulation defect, unspecified: Secondary | ICD-10-CM | POA: Diagnosis not present

## 2019-08-10 DIAGNOSIS — D509 Iron deficiency anemia, unspecified: Secondary | ICD-10-CM | POA: Diagnosis not present

## 2019-08-10 DIAGNOSIS — D631 Anemia in chronic kidney disease: Secondary | ICD-10-CM | POA: Diagnosis not present

## 2019-08-10 DIAGNOSIS — Z992 Dependence on renal dialysis: Secondary | ICD-10-CM | POA: Diagnosis not present

## 2019-08-12 DIAGNOSIS — D631 Anemia in chronic kidney disease: Secondary | ICD-10-CM | POA: Diagnosis not present

## 2019-08-12 DIAGNOSIS — E1129 Type 2 diabetes mellitus with other diabetic kidney complication: Secondary | ICD-10-CM | POA: Diagnosis not present

## 2019-08-12 DIAGNOSIS — N186 End stage renal disease: Secondary | ICD-10-CM | POA: Diagnosis not present

## 2019-08-12 DIAGNOSIS — N2581 Secondary hyperparathyroidism of renal origin: Secondary | ICD-10-CM | POA: Diagnosis not present

## 2019-08-12 DIAGNOSIS — D689 Coagulation defect, unspecified: Secondary | ICD-10-CM | POA: Diagnosis not present

## 2019-08-12 DIAGNOSIS — Z992 Dependence on renal dialysis: Secondary | ICD-10-CM | POA: Diagnosis not present

## 2019-08-12 DIAGNOSIS — D509 Iron deficiency anemia, unspecified: Secondary | ICD-10-CM | POA: Diagnosis not present

## 2019-08-12 DIAGNOSIS — L299 Pruritus, unspecified: Secondary | ICD-10-CM | POA: Diagnosis not present

## 2019-08-15 DIAGNOSIS — N186 End stage renal disease: Secondary | ICD-10-CM | POA: Diagnosis not present

## 2019-08-15 DIAGNOSIS — L299 Pruritus, unspecified: Secondary | ICD-10-CM | POA: Diagnosis not present

## 2019-08-15 DIAGNOSIS — D509 Iron deficiency anemia, unspecified: Secondary | ICD-10-CM | POA: Diagnosis not present

## 2019-08-15 DIAGNOSIS — N2581 Secondary hyperparathyroidism of renal origin: Secondary | ICD-10-CM | POA: Diagnosis not present

## 2019-08-15 DIAGNOSIS — D689 Coagulation defect, unspecified: Secondary | ICD-10-CM | POA: Diagnosis not present

## 2019-08-15 DIAGNOSIS — E1129 Type 2 diabetes mellitus with other diabetic kidney complication: Secondary | ICD-10-CM | POA: Diagnosis not present

## 2019-08-15 DIAGNOSIS — Z992 Dependence on renal dialysis: Secondary | ICD-10-CM | POA: Diagnosis not present

## 2019-08-15 DIAGNOSIS — D631 Anemia in chronic kidney disease: Secondary | ICD-10-CM | POA: Diagnosis not present

## 2019-08-17 DIAGNOSIS — N2581 Secondary hyperparathyroidism of renal origin: Secondary | ICD-10-CM | POA: Diagnosis not present

## 2019-08-17 DIAGNOSIS — D689 Coagulation defect, unspecified: Secondary | ICD-10-CM | POA: Diagnosis not present

## 2019-08-17 DIAGNOSIS — L299 Pruritus, unspecified: Secondary | ICD-10-CM | POA: Diagnosis not present

## 2019-08-17 DIAGNOSIS — D509 Iron deficiency anemia, unspecified: Secondary | ICD-10-CM | POA: Diagnosis not present

## 2019-08-17 DIAGNOSIS — D631 Anemia in chronic kidney disease: Secondary | ICD-10-CM | POA: Diagnosis not present

## 2019-08-17 DIAGNOSIS — E1129 Type 2 diabetes mellitus with other diabetic kidney complication: Secondary | ICD-10-CM | POA: Diagnosis not present

## 2019-08-17 DIAGNOSIS — N186 End stage renal disease: Secondary | ICD-10-CM | POA: Diagnosis not present

## 2019-08-17 DIAGNOSIS — Z992 Dependence on renal dialysis: Secondary | ICD-10-CM | POA: Diagnosis not present

## 2019-08-18 ENCOUNTER — Encounter: Payer: Self-pay | Admitting: Podiatry

## 2019-08-18 ENCOUNTER — Ambulatory Visit (INDEPENDENT_AMBULATORY_CARE_PROVIDER_SITE_OTHER): Payer: Medicare HMO | Admitting: Podiatry

## 2019-08-18 ENCOUNTER — Other Ambulatory Visit: Payer: Self-pay

## 2019-08-18 DIAGNOSIS — M779 Enthesopathy, unspecified: Secondary | ICD-10-CM | POA: Diagnosis not present

## 2019-08-18 NOTE — Progress Notes (Signed)
Subjective:   Patient ID: [name] [name], female   DOB: 64 y.o.   MRN: [id number]   HPI Patient states that she has developed pain around the fourth metatarsal phalangeal joints again bilateral and she had relief for around 5 months.  Patient states it is quite sore   ROS      Objective:  Physical Exam  Neurovascular status intact with inflammation pain around the fourth MPJ bilateral that is painful when pressed     Assessment:  Inflammatory capsulitis fourth MPJ bilateral     Plan:  Sterile prep done injected the joint surfaces 3 mg Dexasone Kenalog 5 mg Xylocaine and advised on thicker bottom shoes and patient will be seen back as needed

## 2019-08-19 DIAGNOSIS — D689 Coagulation defect, unspecified: Secondary | ICD-10-CM | POA: Diagnosis not present

## 2019-08-19 DIAGNOSIS — L299 Pruritus, unspecified: Secondary | ICD-10-CM | POA: Diagnosis not present

## 2019-08-19 DIAGNOSIS — E1129 Type 2 diabetes mellitus with other diabetic kidney complication: Secondary | ICD-10-CM | POA: Diagnosis not present

## 2019-08-19 DIAGNOSIS — Z992 Dependence on renal dialysis: Secondary | ICD-10-CM | POA: Diagnosis not present

## 2019-08-19 DIAGNOSIS — D631 Anemia in chronic kidney disease: Secondary | ICD-10-CM | POA: Diagnosis not present

## 2019-08-19 DIAGNOSIS — N186 End stage renal disease: Secondary | ICD-10-CM | POA: Diagnosis not present

## 2019-08-19 DIAGNOSIS — D509 Iron deficiency anemia, unspecified: Secondary | ICD-10-CM | POA: Diagnosis not present

## 2019-08-19 DIAGNOSIS — N2581 Secondary hyperparathyroidism of renal origin: Secondary | ICD-10-CM | POA: Diagnosis not present

## 2019-08-22 DIAGNOSIS — E1129 Type 2 diabetes mellitus with other diabetic kidney complication: Secondary | ICD-10-CM | POA: Diagnosis not present

## 2019-08-22 DIAGNOSIS — D631 Anemia in chronic kidney disease: Secondary | ICD-10-CM | POA: Diagnosis not present

## 2019-08-22 DIAGNOSIS — D689 Coagulation defect, unspecified: Secondary | ICD-10-CM | POA: Diagnosis not present

## 2019-08-22 DIAGNOSIS — L299 Pruritus, unspecified: Secondary | ICD-10-CM | POA: Diagnosis not present

## 2019-08-22 DIAGNOSIS — I150 Renovascular hypertension: Secondary | ICD-10-CM | POA: Diagnosis not present

## 2019-08-22 DIAGNOSIS — N186 End stage renal disease: Secondary | ICD-10-CM | POA: Diagnosis not present

## 2019-08-22 DIAGNOSIS — D509 Iron deficiency anemia, unspecified: Secondary | ICD-10-CM | POA: Diagnosis not present

## 2019-08-22 DIAGNOSIS — N2581 Secondary hyperparathyroidism of renal origin: Secondary | ICD-10-CM | POA: Diagnosis not present

## 2019-08-22 DIAGNOSIS — Z992 Dependence on renal dialysis: Secondary | ICD-10-CM | POA: Diagnosis not present

## 2019-08-24 DIAGNOSIS — E1129 Type 2 diabetes mellitus with other diabetic kidney complication: Secondary | ICD-10-CM | POA: Diagnosis not present

## 2019-08-24 DIAGNOSIS — N186 End stage renal disease: Secondary | ICD-10-CM | POA: Diagnosis not present

## 2019-08-24 DIAGNOSIS — D689 Coagulation defect, unspecified: Secondary | ICD-10-CM | POA: Diagnosis not present

## 2019-08-24 DIAGNOSIS — N2581 Secondary hyperparathyroidism of renal origin: Secondary | ICD-10-CM | POA: Diagnosis not present

## 2019-08-24 DIAGNOSIS — Z992 Dependence on renal dialysis: Secondary | ICD-10-CM | POA: Diagnosis not present

## 2019-08-24 DIAGNOSIS — L299 Pruritus, unspecified: Secondary | ICD-10-CM | POA: Diagnosis not present

## 2019-08-24 DIAGNOSIS — Z111 Encounter for screening for respiratory tuberculosis: Secondary | ICD-10-CM | POA: Diagnosis not present

## 2019-08-24 DIAGNOSIS — D631 Anemia in chronic kidney disease: Secondary | ICD-10-CM | POA: Diagnosis not present

## 2019-08-26 DIAGNOSIS — Z111 Encounter for screening for respiratory tuberculosis: Secondary | ICD-10-CM | POA: Diagnosis not present

## 2019-08-26 DIAGNOSIS — D689 Coagulation defect, unspecified: Secondary | ICD-10-CM | POA: Diagnosis not present

## 2019-08-26 DIAGNOSIS — N186 End stage renal disease: Secondary | ICD-10-CM | POA: Diagnosis not present

## 2019-08-26 DIAGNOSIS — D631 Anemia in chronic kidney disease: Secondary | ICD-10-CM | POA: Diagnosis not present

## 2019-08-26 DIAGNOSIS — E1129 Type 2 diabetes mellitus with other diabetic kidney complication: Secondary | ICD-10-CM | POA: Diagnosis not present

## 2019-08-26 DIAGNOSIS — Z992 Dependence on renal dialysis: Secondary | ICD-10-CM | POA: Diagnosis not present

## 2019-08-26 DIAGNOSIS — N2581 Secondary hyperparathyroidism of renal origin: Secondary | ICD-10-CM | POA: Diagnosis not present

## 2019-08-26 DIAGNOSIS — L299 Pruritus, unspecified: Secondary | ICD-10-CM | POA: Diagnosis not present

## 2019-08-29 DIAGNOSIS — D689 Coagulation defect, unspecified: Secondary | ICD-10-CM | POA: Diagnosis not present

## 2019-08-29 DIAGNOSIS — N186 End stage renal disease: Secondary | ICD-10-CM | POA: Diagnosis not present

## 2019-08-29 DIAGNOSIS — Z111 Encounter for screening for respiratory tuberculosis: Secondary | ICD-10-CM | POA: Diagnosis not present

## 2019-08-29 DIAGNOSIS — L299 Pruritus, unspecified: Secondary | ICD-10-CM | POA: Diagnosis not present

## 2019-08-29 DIAGNOSIS — D631 Anemia in chronic kidney disease: Secondary | ICD-10-CM | POA: Diagnosis not present

## 2019-08-29 DIAGNOSIS — E1129 Type 2 diabetes mellitus with other diabetic kidney complication: Secondary | ICD-10-CM | POA: Diagnosis not present

## 2019-08-29 DIAGNOSIS — N2581 Secondary hyperparathyroidism of renal origin: Secondary | ICD-10-CM | POA: Diagnosis not present

## 2019-08-29 DIAGNOSIS — Z992 Dependence on renal dialysis: Secondary | ICD-10-CM | POA: Diagnosis not present

## 2019-08-31 DIAGNOSIS — N2581 Secondary hyperparathyroidism of renal origin: Secondary | ICD-10-CM | POA: Diagnosis not present

## 2019-08-31 DIAGNOSIS — Z992 Dependence on renal dialysis: Secondary | ICD-10-CM | POA: Diagnosis not present

## 2019-08-31 DIAGNOSIS — D631 Anemia in chronic kidney disease: Secondary | ICD-10-CM | POA: Diagnosis not present

## 2019-08-31 DIAGNOSIS — N186 End stage renal disease: Secondary | ICD-10-CM | POA: Diagnosis not present

## 2019-08-31 DIAGNOSIS — L299 Pruritus, unspecified: Secondary | ICD-10-CM | POA: Diagnosis not present

## 2019-08-31 DIAGNOSIS — E1129 Type 2 diabetes mellitus with other diabetic kidney complication: Secondary | ICD-10-CM | POA: Diagnosis not present

## 2019-08-31 DIAGNOSIS — Z111 Encounter for screening for respiratory tuberculosis: Secondary | ICD-10-CM | POA: Diagnosis not present

## 2019-08-31 DIAGNOSIS — D689 Coagulation defect, unspecified: Secondary | ICD-10-CM | POA: Diagnosis not present

## 2019-09-01 ENCOUNTER — Emergency Department (HOSPITAL_COMMUNITY): Payer: Medicare HMO

## 2019-09-01 ENCOUNTER — Ambulatory Visit (INDEPENDENT_AMBULATORY_CARE_PROVIDER_SITE_OTHER): Payer: Medicare HMO | Admitting: Family Medicine

## 2019-09-01 ENCOUNTER — Encounter: Payer: Self-pay | Admitting: Family Medicine

## 2019-09-01 ENCOUNTER — Other Ambulatory Visit: Payer: Self-pay

## 2019-09-01 ENCOUNTER — Emergency Department (HOSPITAL_COMMUNITY)
Admission: EM | Admit: 2019-09-01 | Discharge: 2019-09-01 | Disposition: A | Discharge status: Home / Self Care | Payer: Medicare HMO | Attending: Emergency Medicine | Admitting: Emergency Medicine

## 2019-09-01 ENCOUNTER — Encounter (HOSPITAL_COMMUNITY): Payer: Self-pay | Admitting: Emergency Medicine

## 2019-09-01 VITALS — BP 170/89 | HR 91 | Temp 97.9°F | Resp 20 | Ht 66.0 in | Wt 176.5 lb

## 2019-09-01 DIAGNOSIS — R0902 Hypoxemia: Secondary | ICD-10-CM

## 2019-09-01 DIAGNOSIS — N186 End stage renal disease: Secondary | ICD-10-CM | POA: Insufficient documentation

## 2019-09-01 DIAGNOSIS — I2510 Atherosclerotic heart disease of native coronary artery without angina pectoris: Secondary | ICD-10-CM | POA: Insufficient documentation

## 2019-09-01 DIAGNOSIS — Z8249 Family history of ischemic heart disease and other diseases of the circulatory system: Secondary | ICD-10-CM | POA: Diagnosis not present

## 2019-09-01 DIAGNOSIS — E1122 Type 2 diabetes mellitus with diabetic chronic kidney disease: Secondary | ICD-10-CM | POA: Diagnosis not present

## 2019-09-01 DIAGNOSIS — I120 Hypertensive chronic kidney disease with stage 5 chronic kidney disease or end stage renal disease: Secondary | ICD-10-CM | POA: Insufficient documentation

## 2019-09-01 DIAGNOSIS — R0602 Shortness of breath: Secondary | ICD-10-CM

## 2019-09-01 DIAGNOSIS — Z992 Dependence on renal dialysis: Secondary | ICD-10-CM | POA: Diagnosis not present

## 2019-09-01 DIAGNOSIS — R000 Tachycardia, unspecified: Secondary | ICD-10-CM | POA: Diagnosis not present

## 2019-09-01 DIAGNOSIS — Z888 Allergy status to other drugs, medicaments and biological substances status: Secondary | ICD-10-CM | POA: Diagnosis not present

## 2019-09-01 DIAGNOSIS — Z940 Kidney transplant status: Secondary | ICD-10-CM | POA: Diagnosis not present

## 2019-09-01 DIAGNOSIS — J939 Pneumothorax, unspecified: Secondary | ICD-10-CM | POA: Diagnosis not present

## 2019-09-01 DIAGNOSIS — E785 Hyperlipidemia, unspecified: Secondary | ICD-10-CM | POA: Diagnosis not present

## 2019-09-01 DIAGNOSIS — Z79899 Other long term (current) drug therapy: Secondary | ICD-10-CM | POA: Insufficient documentation

## 2019-09-01 DIAGNOSIS — Z87891 Personal history of nicotine dependence: Secondary | ICD-10-CM | POA: Insufficient documentation

## 2019-09-01 DIAGNOSIS — J90 Pleural effusion, not elsewhere classified: Secondary | ICD-10-CM | POA: Diagnosis not present

## 2019-09-01 DIAGNOSIS — R52 Pain, unspecified: Secondary | ICD-10-CM | POA: Diagnosis not present

## 2019-09-01 DIAGNOSIS — J810 Acute pulmonary edema: Secondary | ICD-10-CM | POA: Insufficient documentation

## 2019-09-01 DIAGNOSIS — I517 Cardiomegaly: Secondary | ICD-10-CM | POA: Diagnosis not present

## 2019-09-01 DIAGNOSIS — F419 Anxiety disorder, unspecified: Secondary | ICD-10-CM | POA: Insufficient documentation

## 2019-09-01 DIAGNOSIS — I10 Essential (primary) hypertension: Secondary | ICD-10-CM | POA: Diagnosis not present

## 2019-09-01 LAB — COMPREHENSIVE METABOLIC PANEL
ALT: 22 U/L (ref 0–44)
AST: 23 U/L (ref 15–41)
Albumin: 4.1 g/dL (ref 3.5–5.0)
Alkaline Phosphatase: 94 U/L (ref 38–126)
Anion gap: 15 (ref 5–15)
BUN: 28 mg/dL — ABNORMAL HIGH (ref 8–23)
CO2: 28 mmol/L (ref 22–32)
Calcium: 9.5 mg/dL (ref 8.9–10.3)
Chloride: 97 mmol/L — ABNORMAL LOW (ref 98–111)
Creatinine, Ser: 7.07 mg/dL — ABNORMAL HIGH (ref 0.44–1.00)
GFR calc Af Amer: 7 mL/min — ABNORMAL LOW (ref >60)
GFR calc non Af Amer: 6 mL/min — ABNORMAL LOW (ref >60)
Glucose, Bld: 83 mg/dL (ref 70–99)
Potassium: 3.7 mmol/L (ref 3.5–5.1)
Sodium: 140 mmol/L (ref 135–145)
Total Bilirubin: 0.7 mg/dL (ref 0.3–1.2)
Total Protein: 7.4 g/dL (ref 6.5–8.1)

## 2019-09-01 LAB — CBC WITH DIFFERENTIAL/PLATELET
Abs Immature Granulocytes: 0.02 10*3/uL (ref 0.00–0.07)
Basophils Absolute: 0.1 10*3/uL (ref 0.0–0.1)
Basophils Relative: 1 %
Eosinophils Absolute: 0.2 10*3/uL (ref 0.0–0.5)
Eosinophils Relative: 3 %
HCT: 33.5 % — ABNORMAL LOW (ref 36.0–46.0)
Hemoglobin: 10.4 g/dL — ABNORMAL LOW (ref 12.0–15.0)
Immature Granulocytes: 0 %
Lymphocytes Relative: 21 %
Lymphs Abs: 1.6 10*3/uL (ref 0.7–4.0)
MCH: 30.3 pg (ref 26.0–34.0)
MCHC: 31 g/dL (ref 30.0–36.0)
MCV: 97.7 fL (ref 80.0–100.0)
Monocytes Absolute: 0.5 10*3/uL (ref 0.1–1.0)
Monocytes Relative: 6 %
Neutro Abs: 5.4 10*3/uL (ref 1.7–7.7)
Neutrophils Relative %: 69 %
Platelets: 222 10*3/uL (ref 150–400)
RBC: 3.43 MIL/uL — ABNORMAL LOW (ref 3.87–5.11)
RDW: 16.6 % — ABNORMAL HIGH (ref 11.5–15.5)
WBC: 7.8 10*3/uL (ref 4.0–10.5)
nRBC: 0 % (ref 0.0–0.2)

## 2019-09-01 LAB — BRAIN NATRIURETIC PEPTIDE: B Natriuretic Peptide: 1103.5 pg/mL — ABNORMAL HIGH (ref 0.0–100.0)

## 2019-09-01 MED ORDER — ALBUTEROL SULFATE HFA 108 (90 BASE) MCG/ACT IN AERS: 2.0000 | INHALATION_SPRAY | RESPIRATORY_TRACT | Status: DC | PRN

## 2019-09-01 MED ORDER — ALBUTEROL SULFATE HFA 108 (90 BASE) MCG/ACT IN AERS
2.0000 | INHALATION_SPRAY | Freq: Four times a day (QID) | RESPIRATORY_TRACT | Status: DC
  Administered 2019-09-01: 2 via RESPIRATORY_TRACT
  Filled 2019-09-01: qty 6.7

## 2019-09-01 MED ORDER — NITROGLYCERIN 2 % TD OINT
0.5000 [in_us] | TOPICAL_OINTMENT | Freq: Once | TRANSDERMAL | Status: AC
  Administered 2019-09-01: 0.5 [in_us] via TOPICAL
  Filled 2019-09-01: qty 1

## 2019-09-01 NOTE — ED Triage Notes (Signed)
Pt BIB [facility] from her doctor's office (there for right hip pain), while ambulating across the room, pt became short of breath and SpO2 decreased to 77% room air, improved to 90s on 4L [place]. Pt does dialysis MWF, and had a normal treatment yesterday. On arrival to ED, pt on 2L [place], pt reports improvement of shortness of breath, denies chest pain.

## 2019-09-01 NOTE — Progress Notes (Signed)
° °  Subjective:    Patient ID: [name] [name], female    DOB: [birth year], 64 y.o.   MRN: [id number]  HPI SOB- pt arrived today w/ O2 sat of 77% on RA after ambulating from front door to 1st exam room.  She reports she has been short of breath x3 weeks but things have worsened over the last few days.  Has had to use albuterol inhaler more frequently.  She reports she has been unable to talk in complete sentences while on the phone w/ friends and family and she has struggled with the steps into her apartment.  She had HD yesterday and reports her SOB is worse after dialysis (M/W/F).  Denies CP or pressure at this time.  Does report a 'rattle' at night when lying down.  No swelling of hands or feet.  Pt's O2 improved to 88% on 2L and 91% on 3L.  Denies recent cough.   Review of Systems For ROS see HPI   This visit occurred during the SARS-CoV-2 public health emergency.  Safety protocols were in place, including screening questions prior to the visit, additional usage of staff PPE, and extensive cleaning of exam room while observing appropriate contact time as indicated for disinfecting solutions.       Objective:   Physical Exam Vitals reviewed.  Constitutional:      General: She is not in acute distress.    Appearance: She is well-developed. She is obese. She is not ill-appearing.  HENT:     Head: Normocephalic and atraumatic.  Cardiovascular:     Rate and Rhythm: Normal rate and regular rhythm.     Heart sounds: Murmur heard.   Pulmonary:     Effort: Tachypnea present.     Breath sounds: Decreased breath sounds present. No wheezing or rhonchi.  Abdominal:     Palpations: Abdomen is soft.     Tenderness: There is no abdominal tenderness.  Musculoskeletal:     Right lower leg: No edema.     Left lower leg: No edema.  Skin:    General: Skin is warm and dry.  Neurological:     Mental Status: She is alert.  Psychiatric:        Mood and Affect: Mood normal.        Behavior:  Behavior normal.           Assessment & Plan:  Shortness of breath/Hypoxemia- new.  Pt's initial O2 sats 77%.  Improved to 88% w/ 2L O2 and 91% on 3L O2.  Her situation is obviously concerning as she seems acclimated to these lower O2 levels.  Given her complicated medical hx- dialysis, ascending thoracic aortic aneurysm, DM, anemia- she needs complete ER evaluation.  911 called.  Pt understands.

## 2019-09-01 NOTE — Discharge Instructions (Signed)
As discussed, it is very important that you go to dialysis tomorrow.  Should you develop new, or concerning changes in the interim, return here immediately. In addition, please be sure to follow-up with your primary care physician, discuss today's evaluation, your ongoing dialysis needs and your blood pressure medication regimen.

## 2019-09-01 NOTE — ED Provider Notes (Signed)
[place] EMERGENCY DEPARTMENT Provider Note   CSN: [id number] Arrival date & time: 09/01/19  1529     History Chief Complaint  Patient presents with  . Shortness of Breath    [name] [name] is a 64 y.o. female.  HPI    Patient presents from her 75 office on request for evaluation of hypoxia. Patient has multiple medical issues including end-stage renal disease, had dialysis yesterday. She notes that she has recently had slight change in her dry weight and from low 80s to 79 kg per She denies pain, nausea, focal weakness.  She does note that for the last few weeks going up a flight of stairs to her apartment has been more difficult than it was previously, but otherwise no new dyspnea, fatigue, exertional changes. Today she went to her 68 office for evaluation of ongoing hip pain, not traumatic.  While there, her pulse oximetry showed readings in the 70s, with ambulation and she was sent here for evaluation.   Past Medical History:  Diagnosis Date  . Adenomatous colon polyp 04/1998  . Anemia   . Anxiety   . Arthritis   . Carotid artery disease ([place])    Carotid US 1/18: bilateral ICA 1-39, R thyroid lobe nodule (1.9x2.2x3cm); numerous L thyroid lobe nodules - repeat 1 year  . Chronic kidney disease    M/W/[facility]  . Chronic renal failure    post transplant  . Depression   . Diabetes mellitus    diet controlled  . EBV infection    Per [facility]  . Encephalitis    NMDA per [facility]  . Esophagitis    Grade 1 Distal  . Focal seizure (HCC)    .  Last one 06/2016  . GERD (gastroesophageal reflux disease)   . Hemorrhoids   . History of blood transfusion   . History of kidney stones    Passed  . History of subdural hematoma   . Hx of cardiovascular stress test    Lexiscan Myoview (06/2013):  No ischemia, EF 66%; normal.  //  Myoview 12/17: EF 62, no ischemia or scar; Normal  . Hx of echocardiogram    a. Echocardiogram (06/2013):  Mod  focal basal hypertrophy, EF 60-65%, normal wall motion, Gr 1 DD, mild AI, mildly dilated ascending aorta (41 mm), mild LAE.; b.  Echo 9/16: mod LVH, EF 60-65%, no RWMA, Gr 1 DD, trivial AI, mild dilated ascending aorta, mild LAE  . Hyperkalemia   . Hyperlipidemia   . Hypertension   . Hypomagnesemia   . Left jugular vein thrombosis    Partial resolved per [facility]  . Metabolic acidosis   . Pneumonia   . Right jugular vein thrombosis    resolved from [facility]  . Seizures [facility])     Patient Active Problem List   Diagnosis Date Noted  . Tumor 07/28/2019  . History of seizures 07/28/2019  . Tremor 07/28/2019  . Stress incontinence 12/24/2017  . Cough variant asthma 12/24/2017  . Tremor of left hand 12/24/2017  . History of internal jugular thrombosis 03/26/2017  . 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 Anemia 07/08/2016  . Abnormal brain MRI 07/03/2016  . Fasciculation 06/18/2016  . Carotid artery disease ([place])   . Physical exam 01/28/2016  . Hyperlipidemia 06/28/2015  . Diabetes mellitus type II, controlled ([place]) 06/28/2015  . Thoracic ascending aortic aneurysm (41 mm on Echo 06/2013) 07/06/2013  . ESRD on dialysis ([place]) 06/16/2013  . History of renal  transplant 06/16/2013  . GERD (gastroesophageal reflux disease) 08/13/2009  . OTH&UNSPEC NONINFECTIOUS GASTROENTERITIS&COLITIS 08/13/2009  . Essential hypertension 04/25/2009  . Hemorrhoids 04/25/2009  . WEIGHT LOSS 04/25/2009  . NAUSEA AND VOMITING 04/25/2009  . PERSONAL HX COLONIC POLYPS 04/25/2009    Past Surgical History:  Procedure Laterality Date  . A/V FISTULAGRAM Left 06/04/2017   Procedure: A/V FISTULAGRAM;  Surgeon: Conrad Houghton, MD;  Location: [place] [facility];  Service: Cardiovascular;  Laterality: Left;  . A/V FISTULAGRAM N/A 03/24/2019   Procedure: A/V FISTULAGRAM - left arm;  Surgeon: [name], MD;  Location: [place] [facility];  Service: Cardiovascular;  Laterality: N/A;    . ABDOMINAL HYSTERECTOMY    . AV FISTULA PLACEMENT  07/04/2005   [place] AV fistula  . AV FISTULA PLACEMENT  08/27/2005  . AV FISTULA PLACEMENT Left 04/29/2017   Procedure: Creation of Left Arm ARTERIOVENOUS BRACHIOCEPHALIC FISTULA;  Surgeon: Elam Dutch, MD;  Location: [place];  Service: Vascular;  Laterality: Left;  . AV FISTULA PLACEMENT Left 06/16/2017   Procedure: BRACHIO_BASCILIC ARTERIOVENOUS (AV) FISTULA CREATION;  Surgeon: Waynetta Sandy, MD;  Location: [place];  Service: Vascular;  Laterality: Left;  . AV FISTULA PLACEMENT W/ PTFE  08/27/2005  . BASCILIC VEIN TRANSPOSITION Left 06/16/2017   Procedure: BRACHIOCEPHALIC TRANSPOSITION  LEFT ARM;  Surgeon: Waynetta Sandy, MD;  Location: [place];  Service: Vascular;  Laterality: Left;  . BASCILIC VEIN TRANSPOSITION Left 09/01/2017   Procedure: BASILIC VEIN TRANSPOSITION SECOND STAGE;  Surgeon: Waynetta Sandy, MD;  Location: [place];  Service: Vascular;  Laterality: Left;  . BRAIN BIOPSY    . CESAREAN SECTION    . COLONOSCOPY W/ POLYPECTOMY    . DG AV DIALYSIS GRAFT DECLOT OR  07/24/2005   AV Gore-Tex graf  . DG AV DIALYSIS GRAFT DECLOT OR  Thrombosis right forearm, loop arteriovenous   Thrombosis right forearm, loop arteriovenous graft  . GASTROSTOMY TUBE PLACEMENT    . KIDNEY TRANSPLANT  2009   Both  . PERIPHERAL VASCULAR BALLOON ANGIOPLASTY Left 03/24/2019   Procedure: PERIPHERAL VASCULAR BALLOON ANGIOPLASTY;  Surgeon: [name], MD;  Location: [place] [facility];  Service: Cardiovascular;  Laterality: Left;  left AV fistula  . REMOVAL OF GASTROSTOMY TUBE    . REVISON OF ARTERIOVENOUS FISTULA Left 11/23/2018   Procedure: REVISION PLICATION OF ARTERIOVENOUS FISTULA;  Surgeon: Waynetta Sandy, MD;  Location: [place];  Service: Vascular;  Laterality: Left;  . THROMBECTOMY / ARTERIOVENOUS GRAFT REVISION  10/12/2006  . THROMBECTOMY / ARTERIOVENOUS GRAFT REVISION  10/16/2006     OB History    No obstetric history on file.     Family History  Problem Relation Age of Onset  . Kidney disease Paternal Aunt   . Heart disease Mother   . Heart disease Father   . Colon cancer Neg Hx     Social History   Tobacco Use  . Smoking status: Former Research scientist (life sciences)  . Smokeless tobacco: Never Used  . Tobacco comment: smoked in teens  Vaping Use  . Vaping Use: Never used  Substance Use Topics  . Alcohol use: No  . Drug use: No    Home Medications Prior to Admission medications   Medication Sig Start Date End Date Taking? Authorizing Provider  acetaminophen (TYLENOL) 650 MG CR tablet Take 650-1,300 mg by mouth every 8 (eight) hours as needed for pain.    Yes [provider]  albuterol (VENTOLIN HFA) 108 (90 Base) MCG/ACT  inhaler INHALE 2 PUFFS INTO THE LUNGS EVERY 4 (FOUR) HOURS AS NEEDED FOR WHEEZING OR SHORTNESS OF BREATH. 08/03/19  Yes Midge Minium, MD  amLODipine (NORVASC) 10 MG tablet TAKE 1 TABLET BY MOUTH EVERY DAY Patient taking differently: Take 10 mg by mouth daily.  03/08/19  Yes Midge Minium, MD  atorvastatin (LIPITOR) 40 MG tablet Take 1 tablet (40 mg total) by mouth daily. 07/06/19  Yes Nahser, Wonda Cheng, MD  blood glucose meter kit and supplies KIT Dispense based on patient and insurance preference. Patient should test sugars twice daily as directed. Dx. E11.9 12/10/18  Yes Midge Minium, MD  cetirizine (ZYRTEC) 10 MG tablet TAKE 1 TABLET BY MOUTH EVERY DAY Patient taking differently: Take 10 mg by mouth daily.  07/27/19  Yes Midge Minium, MD  ferric citrate (AURYXIA) 1 GM 210 MG(Fe) tablet Take 210 mg by mouth See admin instructions. Take 210 mg by mouth three times a day with meals and 210 mg two times a day with snacks   Yes [provider]  gabapentin (NEURONTIN) 100 MG capsule TAKE 1 CAPSULE (100 MG TOTAL) BY MOUTH AT BEDTIME. 05/30/19  Yes Midge Minium, MD  glucose blood (ONETOUCH ULTRA) test strip Use as instructed to test  sugars twice daily. Dx. E11.9 03/29/19  Yes Midge Minium, MD  HYDROcodone-acetaminophen (NORCO/VICODIN) 5-325 MG tablet Take 1 tablet by mouth every 6 (six) hours as needed for moderate pain.  04/12/19  Yes [provider]  isosorbide dinitrate (ISORDIL) 20 MG tablet TAKE 1 TABLET BY MOUTH THREE TIMES A DAY Patient taking differently: Take 20 mg by mouth 3 (three) times daily.  02/02/19  Yes Midge Minium, MD  loratadine (CLARITIN) 10 MG tablet Take 10 mg by mouth daily as needed for allergies.   Yes [provider]  meclizine (ANTIVERT) 25 MG tablet TAKE 1 TABLET (25 MG TOTAL) BY MOUTH 3 (THREE) TIMES DAILY AS NEEDED FOR DIZZINESS. 05/02/19  Yes Midge Minium, MD  multivitamin (RENA-VIT) TABS tablet Take 1 tablet by mouth every morning.  03/20/17  Yes [provider]  omeprazole (PRILOSEC) 40 MG capsule TAKE 1 CAPSULE BY MOUTH EVERY DAY Patient taking differently: Take 40 mg by mouth daily.  05/09/19  Yes Midge Minium, MD  QUEtiapine (SEROQUEL) 100 MG tablet TAKE 1 TABLET BY MOUTH EVERYDAY AT BEDTIME Patient taking differently: Take 100 mg by mouth at bedtime.  05/30/19  Yes Midge Minium, MD  traZODone (DESYREL) 50 MG tablet Take 50 mg by mouth at bedtime as needed for sleep.  07/29/19  Yes [provider]  nitroGLYCERIN (NITROSTAT) 0.4 MG SL tablet Place 0.4 mg under the tongue every 5 (five) minutes as needed for chest pain.  06/16/13   [provider]    Allergies    Lisinopril and Daypro [oxaprozin]  Review of Systems   Review of Systems  Constitutional:       Per HPI, otherwise negative  HENT:       Per HPI, otherwise negative  Respiratory:       Per HPI, otherwise negative  Cardiovascular:       Per HPI, otherwise negative  Gastrointestinal: Negative for vomiting.  Endocrine:       Negative aside from HPI  Genitourinary:       Neg aside from HPI   Musculoskeletal:       Per HPI, otherwise negative  Skin:  Negative.   Allergic/Immunologic: Positive for immunocompromised state.  Neurological: Negative for syncope.    Physical Exam Updated Vital Signs BP (!) 180/103 (BP Location: Right Arm)   Pulse (!) 107   Temp 98.7 F (37.1 C) (Oral)   Resp 13   Ht '5\' 6"'  (1.676 m)   Wt 78.9 kg   SpO2 92%   BMI 28.08 kg/m   Physical Exam Vitals and nursing note reviewed.  Constitutional:      General: She is not in acute distress.    Appearance: She is well-developed.  HENT:     Head: Normocephalic and atraumatic.  Eyes:     Conjunctiva/sclera: Conjunctivae normal.  Cardiovascular:     Rate and Rhythm: Normal rate and regular rhythm.  Pulmonary:     Effort: Pulmonary effort is normal. No respiratory distress.     Breath sounds: Normal breath sounds. No stridor.  Abdominal:     General: There is no distension.  Skin:    General: Skin is warm and dry.  Neurological:     Mental Status: She is alert and oriented to person, place, and time.     Cranial Nerves: No cranial nerve deficit.     ED Results / Procedures / Treatments   Labs (all labs ordered are listed, but only abnormal results are displayed) Labs Reviewed  COMPREHENSIVE METABOLIC PANEL - Abnormal; Notable for the following components:      Result Value   Chloride 97 (*)    BUN 28 (*)    Creatinine, Ser 7.07 (*)    GFR calc non Af Amer 6 (*)    GFR calc Af Amer 7 (*)    All other components within normal limits  BRAIN NATRIURETIC PEPTIDE - Abnormal; Notable for the following components:   B Natriuretic Peptide 1,103.5 (*)    All other components within normal limits  CBC WITH DIFFERENTIAL/PLATELET - Abnormal; Notable for the following components:   RBC 3.43 (*)    Hemoglobin 10.4 (*)    HCT 33.5 (*)    RDW 16.6 (*)    All other components within normal limits    EKG EKG Interpretation  Date/Time:  Thursday September 01 2019 15:27:36 EDT Ventricular Rate:  96 PR Interval:  176 QRS Duration: 92 QT  Interval:  378 QTC Calculation: 477 R Axis:   -38 Text Interpretation: Sinus rhythm with Premature supraventricular complexes Left axis deviation Anterior injury pattern Abnormal ECG Confirmed by Carmin Muskrat [phone]) on 09/01/2019 6:17:06 PM   Radiology DG Chest 2 View  Result Date: 09/01/2019 CLINICAL DATA:  Shortness of breath for 3 weeks EXAM: CHEST - 2 VIEW COMPARISON:  07/28/2019 FINDINGS: Mild cardiomegaly. Prominent perihilar and bibasilar interstitial markings with small focal opacities in the peripheral aspects of the bilateral lung bases. Suspect trace right pleural effusion. No pneumothorax. IMPRESSION: Persistently increased perihilar and bibasilar interstitial markings which may reflect edema versus atypical/viral infection. There are small rounded opacities within the peripheral aspect of the bilateral lung bases which may reflect an alveolar component of edema or multifocal infection in the appropriate clinical setting. Radiographic follow-up to resolution is recommended. Electronically Signed   By: Davina Poke D.O.   On: 09/01/2019 16:46    Procedures Procedures (including critical care time)  Medications Ordered in ED Medications  albuterol (VENTOLIN HFA) 108 (90 Base) MCG/ACT inhaler 2 puff (has no administration in time range)  nitroGLYCERIN (NITROGLYN) 2 % ointment 0.5 inch (0.5 inches Topical Given 09/01/19 2053)    ED Course  I have reviewed the triage vital signs  and the nursing notes.  Pertinent labs & imaging results that were available during my care of the patient were reviewed by me and considered in my medical decision making (see chart for details).  9:10 PM Patient in no distress, sitting up, speaking clearly. She had I spoke at length about today's findings, generally reassuring labs, though with x-ray concerning for pulmonary edema, fluid overload status. We again discussed her recent dialysis sessions, she notes that her dry weight has been change,  but appropriate fluid has not possibly been taken off. We discussed indication for admission, with consideration of dialysis here.  Patient not amenable to staying, as she is scheduled for dialysis first thing in the morning, less than 12 hours from now. Given this, we discussed essentially contracting for safety, the patient will have a family member stay with her, and though she is mildly hypoxic, with a saturation 88/90% on room air, she was discharged, to follow-up tomorrow for dialysis for likely pulmonary edema secondary to persistent hypertension and need for additional dialysis.   Final Clinical Impression(s) / ED Diagnoses Final diagnoses:  SOB (shortness of breath)  Acute pulmonary edema ([place])     Carmin Muskrat, MD 09/01/19 2114

## 2019-09-02 DIAGNOSIS — Z111 Encounter for screening for respiratory tuberculosis: Secondary | ICD-10-CM | POA: Diagnosis not present

## 2019-09-02 DIAGNOSIS — L299 Pruritus, unspecified: Secondary | ICD-10-CM | POA: Diagnosis not present

## 2019-09-02 DIAGNOSIS — Z992 Dependence on renal dialysis: Secondary | ICD-10-CM | POA: Diagnosis not present

## 2019-09-02 DIAGNOSIS — N186 End stage renal disease: Secondary | ICD-10-CM | POA: Diagnosis not present

## 2019-09-02 DIAGNOSIS — D631 Anemia in chronic kidney disease: Secondary | ICD-10-CM | POA: Diagnosis not present

## 2019-09-02 DIAGNOSIS — E1129 Type 2 diabetes mellitus with other diabetic kidney complication: Secondary | ICD-10-CM | POA: Diagnosis not present

## 2019-09-02 DIAGNOSIS — D689 Coagulation defect, unspecified: Secondary | ICD-10-CM | POA: Diagnosis not present

## 2019-09-02 DIAGNOSIS — N2581 Secondary hyperparathyroidism of renal origin: Secondary | ICD-10-CM | POA: Diagnosis not present

## 2019-09-05 DIAGNOSIS — L299 Pruritus, unspecified: Secondary | ICD-10-CM | POA: Diagnosis not present

## 2019-09-05 DIAGNOSIS — Z992 Dependence on renal dialysis: Secondary | ICD-10-CM | POA: Diagnosis not present

## 2019-09-05 DIAGNOSIS — N186 End stage renal disease: Secondary | ICD-10-CM | POA: Diagnosis not present

## 2019-09-05 DIAGNOSIS — E1129 Type 2 diabetes mellitus with other diabetic kidney complication: Secondary | ICD-10-CM | POA: Diagnosis not present

## 2019-09-05 DIAGNOSIS — Z111 Encounter for screening for respiratory tuberculosis: Secondary | ICD-10-CM | POA: Diagnosis not present

## 2019-09-05 DIAGNOSIS — D689 Coagulation defect, unspecified: Secondary | ICD-10-CM | POA: Diagnosis not present

## 2019-09-05 DIAGNOSIS — N2581 Secondary hyperparathyroidism of renal origin: Secondary | ICD-10-CM | POA: Diagnosis not present

## 2019-09-05 DIAGNOSIS — D631 Anemia in chronic kidney disease: Secondary | ICD-10-CM | POA: Diagnosis not present

## 2019-09-06 ENCOUNTER — Telehealth: Payer: Self-pay | Admitting: Family Medicine

## 2019-09-06 ENCOUNTER — Encounter: Payer: Self-pay | Admitting: Family Medicine

## 2019-09-06 ENCOUNTER — Ambulatory Visit (INDEPENDENT_AMBULATORY_CARE_PROVIDER_SITE_OTHER): Payer: Medicare HMO | Admitting: Family Medicine

## 2019-09-06 ENCOUNTER — Other Ambulatory Visit: Payer: Self-pay

## 2019-09-06 VITALS — BP 169/92 | HR 89 | Temp 98.1°F | Resp 17 | Ht 66.0 in | Wt 176.2 lb

## 2019-09-06 DIAGNOSIS — M25552 Pain in left hip: Secondary | ICD-10-CM | POA: Diagnosis not present

## 2019-09-06 DIAGNOSIS — I10 Essential (primary) hypertension: Secondary | ICD-10-CM | POA: Diagnosis not present

## 2019-09-06 MED ORDER — PREDNISONE 10 MG PO TABS
ORAL_TABLET | ORAL | 0 refills | Status: DC
Start: 2019-09-06 — End: 2019-09-22

## 2019-09-06 MED ORDER — HYDRALAZINE HCL 50 MG PO TABS: 50.0000 mg | ORAL_TABLET | Freq: Two times a day (BID) | ORAL | 3 refills | Status: DC

## 2019-09-06 NOTE — Progress Notes (Signed)
° °  Subjective:    Patient ID: [name] [name], female    DOB: 08-Jul-1955, 64 y.o.   MRN: [id number]  HPI HTN- chronic problem.  BP is again elevated today.  On Amlodipine 10mg  daily.  Pt was previously on Clonidine, Hydralazine, Losartan, Atenolol.  Pt reports SOB is much better today.  Pt reports BP has been running high recently.  No CP, HAs, visual changes.  Hip pain- L hip, lateral.  sxs started 'awhile' ago but she has been 'putting up with the pain'.  No pain when sitting but pain when lying on L side.  Pain when up and walking.  No radiation of pain.  No groin pain.   Review of Systems For ROS see HPI   This visit occurred during the SARS-CoV-2 public health emergency.  Safety protocols were in place, including screening questions prior to the visit, additional usage of staff PPE, and extensive cleaning of exam room while observing appropriate contact time as indicated for disinfecting solutions.       Objective:   Physical Exam Vitals reviewed.  Constitutional:      General: She is not in acute distress.    Appearance: Normal appearance. She is well-developed. She is not ill-appearing or toxic-appearing.  HENT:     Head: Normocephalic and atraumatic.  Eyes:     Conjunctiva/sclera: Conjunctivae normal.     Pupils: Pupils are equal, round, and reactive to light.  Neck:     Thyroid: No thyromegaly.  Cardiovascular:     Rate and Rhythm: Normal rate and regular rhythm.     Heart sounds: Normal heart sounds. No murmur heard.   Pulmonary:     Effort: Pulmonary effort is normal. No respiratory distress.     Breath sounds: Normal breath sounds.  Abdominal:     General: There is no distension.     Palpations: Abdomen is soft.     Tenderness: There is no abdominal tenderness.  Musculoskeletal:        General: Tenderness (TTP over L lateral hip) present.     Cervical back: Normal range of motion and neck supple.  Lymphadenopathy:     Cervical: No cervical adenopathy.   Skin:    General: Skin is warm and dry.  Neurological:     Mental Status: She is alert and oriented to person, place, and time.     Gait: Gait abnormal (antalgic gait).  Psychiatric:        Behavior: Behavior normal.           Assessment & Plan:  Lateral hip pain- new to provider, ongoing for pt.  Suspect bursitis.  Start Prednisone taper and if no improvement will refer to Ortho/Sports Med.  Ice for pain relief.  Pt expressed understanding and is in agreement w/ plan.

## 2019-09-06 NOTE — Patient Instructions (Signed)
Follow up in 2 weeks to recheck BP and hip pain ADD the Hydralazine twice daily to help lower blood pressure START the Prednisone as directed- 3 tabs at the same time x3 days and then 2 tabs at the same time x3 days, and then 1 tab daily.  Take w/ food ICE your hip Call with any questions or concerns Hang in there!

## 2019-09-06 NOTE — Telephone Encounter (Signed)
Made in error

## 2019-09-06 NOTE — Assessment & Plan Note (Signed)
Deteriorated.  BP is well above goal.  Asymptomatic at this time.  Add Hydralazine BID to her current Amlodipine.  Will follow closely.

## 2019-09-07 DIAGNOSIS — L299 Pruritus, unspecified: Secondary | ICD-10-CM | POA: Diagnosis not present

## 2019-09-07 DIAGNOSIS — D631 Anemia in chronic kidney disease: Secondary | ICD-10-CM | POA: Diagnosis not present

## 2019-09-07 DIAGNOSIS — N186 End stage renal disease: Secondary | ICD-10-CM | POA: Diagnosis not present

## 2019-09-07 DIAGNOSIS — E1129 Type 2 diabetes mellitus with other diabetic kidney complication: Secondary | ICD-10-CM | POA: Diagnosis not present

## 2019-09-07 DIAGNOSIS — D689 Coagulation defect, unspecified: Secondary | ICD-10-CM | POA: Diagnosis not present

## 2019-09-07 DIAGNOSIS — Z992 Dependence on renal dialysis: Secondary | ICD-10-CM | POA: Diagnosis not present

## 2019-09-07 DIAGNOSIS — Z111 Encounter for screening for respiratory tuberculosis: Secondary | ICD-10-CM | POA: Diagnosis not present

## 2019-09-07 DIAGNOSIS — N2581 Secondary hyperparathyroidism of renal origin: Secondary | ICD-10-CM | POA: Diagnosis not present

## 2019-09-09 DIAGNOSIS — D689 Coagulation defect, unspecified: Secondary | ICD-10-CM | POA: Diagnosis not present

## 2019-09-09 DIAGNOSIS — Z992 Dependence on renal dialysis: Secondary | ICD-10-CM | POA: Diagnosis not present

## 2019-09-09 DIAGNOSIS — D631 Anemia in chronic kidney disease: Secondary | ICD-10-CM | POA: Diagnosis not present

## 2019-09-09 DIAGNOSIS — N2581 Secondary hyperparathyroidism of renal origin: Secondary | ICD-10-CM | POA: Diagnosis not present

## 2019-09-09 DIAGNOSIS — N186 End stage renal disease: Secondary | ICD-10-CM | POA: Diagnosis not present

## 2019-09-09 DIAGNOSIS — L299 Pruritus, unspecified: Secondary | ICD-10-CM | POA: Diagnosis not present

## 2019-09-09 DIAGNOSIS — E1129 Type 2 diabetes mellitus with other diabetic kidney complication: Secondary | ICD-10-CM | POA: Diagnosis not present

## 2019-09-09 DIAGNOSIS — Z111 Encounter for screening for respiratory tuberculosis: Secondary | ICD-10-CM | POA: Diagnosis not present

## 2019-09-12 ENCOUNTER — Telehealth: Payer: Self-pay | Admitting: Family Medicine

## 2019-09-12 DIAGNOSIS — D631 Anemia in chronic kidney disease: Secondary | ICD-10-CM | POA: Diagnosis not present

## 2019-09-12 DIAGNOSIS — N2581 Secondary hyperparathyroidism of renal origin: Secondary | ICD-10-CM | POA: Diagnosis not present

## 2019-09-12 DIAGNOSIS — Z992 Dependence on renal dialysis: Secondary | ICD-10-CM | POA: Diagnosis not present

## 2019-09-12 DIAGNOSIS — Z111 Encounter for screening for respiratory tuberculosis: Secondary | ICD-10-CM | POA: Diagnosis not present

## 2019-09-12 DIAGNOSIS — M25552 Pain in left hip: Secondary | ICD-10-CM

## 2019-09-12 DIAGNOSIS — N186 End stage renal disease: Secondary | ICD-10-CM | POA: Diagnosis not present

## 2019-09-12 DIAGNOSIS — E1129 Type 2 diabetes mellitus with other diabetic kidney complication: Secondary | ICD-10-CM | POA: Diagnosis not present

## 2019-09-12 DIAGNOSIS — D689 Coagulation defect, unspecified: Secondary | ICD-10-CM | POA: Diagnosis not present

## 2019-09-12 DIAGNOSIS — L299 Pruritus, unspecified: Secondary | ICD-10-CM | POA: Diagnosis not present

## 2019-09-12 NOTE — Telephone Encounter (Signed)
Please make referral to Sports Med- dx L lateral hip pain

## 2019-09-12 NOTE — Telephone Encounter (Signed)
Referral placed.

## 2019-09-12 NOTE — Telephone Encounter (Signed)
Pt called in stating that the prednisone is not helping. She is still having a lot of hip pain. She wanted to know what else she could do about this. Please advise

## 2019-09-12 NOTE — Telephone Encounter (Signed)
Please advise 

## 2019-09-13 ENCOUNTER — Telehealth: Payer: Self-pay | Admitting: Family Medicine

## 2019-09-13 NOTE — Telephone Encounter (Signed)
Left message for patient to schedule Annual Wellness Visit.  Please schedule with Nurse Health Advisor Martha Stanley, RN at [facility]  

## 2019-09-13 NOTE — Telephone Encounter (Signed)
Pt has been scheduled.  °

## 2019-09-14 DIAGNOSIS — Z111 Encounter for screening for respiratory tuberculosis: Secondary | ICD-10-CM | POA: Diagnosis not present

## 2019-09-14 DIAGNOSIS — D631 Anemia in chronic kidney disease: Secondary | ICD-10-CM | POA: Diagnosis not present

## 2019-09-14 DIAGNOSIS — N186 End stage renal disease: Secondary | ICD-10-CM | POA: Diagnosis not present

## 2019-09-14 DIAGNOSIS — N2581 Secondary hyperparathyroidism of renal origin: Secondary | ICD-10-CM | POA: Diagnosis not present

## 2019-09-14 DIAGNOSIS — E1129 Type 2 diabetes mellitus with other diabetic kidney complication: Secondary | ICD-10-CM | POA: Diagnosis not present

## 2019-09-14 DIAGNOSIS — L299 Pruritus, unspecified: Secondary | ICD-10-CM | POA: Diagnosis not present

## 2019-09-14 DIAGNOSIS — D689 Coagulation defect, unspecified: Secondary | ICD-10-CM | POA: Diagnosis not present

## 2019-09-14 DIAGNOSIS — Z992 Dependence on renal dialysis: Secondary | ICD-10-CM | POA: Diagnosis not present

## 2019-09-16 DIAGNOSIS — Z992 Dependence on renal dialysis: Secondary | ICD-10-CM | POA: Diagnosis not present

## 2019-09-16 DIAGNOSIS — E1129 Type 2 diabetes mellitus with other diabetic kidney complication: Secondary | ICD-10-CM | POA: Diagnosis not present

## 2019-09-16 DIAGNOSIS — D631 Anemia in chronic kidney disease: Secondary | ICD-10-CM | POA: Diagnosis not present

## 2019-09-16 DIAGNOSIS — D689 Coagulation defect, unspecified: Secondary | ICD-10-CM | POA: Diagnosis not present

## 2019-09-16 DIAGNOSIS — Z111 Encounter for screening for respiratory tuberculosis: Secondary | ICD-10-CM | POA: Diagnosis not present

## 2019-09-16 DIAGNOSIS — N2581 Secondary hyperparathyroidism of renal origin: Secondary | ICD-10-CM | POA: Diagnosis not present

## 2019-09-16 DIAGNOSIS — L299 Pruritus, unspecified: Secondary | ICD-10-CM | POA: Diagnosis not present

## 2019-09-16 DIAGNOSIS — N186 End stage renal disease: Secondary | ICD-10-CM | POA: Diagnosis not present

## 2019-09-19 DIAGNOSIS — D631 Anemia in chronic kidney disease: Secondary | ICD-10-CM | POA: Diagnosis not present

## 2019-09-19 DIAGNOSIS — N2581 Secondary hyperparathyroidism of renal origin: Secondary | ICD-10-CM | POA: Diagnosis not present

## 2019-09-19 DIAGNOSIS — N186 End stage renal disease: Secondary | ICD-10-CM | POA: Diagnosis not present

## 2019-09-19 DIAGNOSIS — D689 Coagulation defect, unspecified: Secondary | ICD-10-CM | POA: Diagnosis not present

## 2019-09-19 DIAGNOSIS — E1129 Type 2 diabetes mellitus with other diabetic kidney complication: Secondary | ICD-10-CM | POA: Diagnosis not present

## 2019-09-19 DIAGNOSIS — L299 Pruritus, unspecified: Secondary | ICD-10-CM | POA: Diagnosis not present

## 2019-09-19 DIAGNOSIS — Z111 Encounter for screening for respiratory tuberculosis: Secondary | ICD-10-CM | POA: Diagnosis not present

## 2019-09-19 DIAGNOSIS — Z992 Dependence on renal dialysis: Secondary | ICD-10-CM | POA: Diagnosis not present

## 2019-09-20 ENCOUNTER — Ambulatory Visit (INDEPENDENT_AMBULATORY_CARE_PROVIDER_SITE_OTHER): Payer: Medicare HMO | Admitting: Physician Assistant

## 2019-09-20 ENCOUNTER — Other Ambulatory Visit: Payer: Self-pay

## 2019-09-20 VITALS — BP 155/86 | HR 91 | Temp 98.3°F | Resp 20 | Ht 66.0 in | Wt 180.1 lb

## 2019-09-20 DIAGNOSIS — N186 End stage renal disease: Secondary | ICD-10-CM | POA: Diagnosis not present

## 2019-09-20 DIAGNOSIS — Z992 Dependence on renal dialysis: Secondary | ICD-10-CM | POA: Diagnosis not present

## 2019-09-20 NOTE — Progress Notes (Signed)
HISTORY AND PHYSICAL     CC:  dialysis access Requesting Provider:  Midge Minium, MD  HPI: This is a 64 y.o. female here for evaluation of her hemodialysis access.  She states that the fistula has been getting bigger and she is sent here for evaluation.  She denies any bleeding from the fistula.  She states there are no sores.  The fistula is working well to her knowledge. It is not causing her any pain.  She does not have pain in her left hand.  She does have occasional tingling in her fingers but this resolves with wiggling her fingers.   .    Pt is on dialysis.   Days of dialysis if applicable:  M/W/F    HD center if applicable:  [facility] location.   The pt is on a statin for cholesterol management.  The pt is not on a daily aspirin.  Other AC:  none The pt is on CCB for hypertension.  The pt is diabetic.   Tobacco hx:  former  Past Medical History:  Diagnosis Date  . Adenomatous colon polyp 04/1998  . Anemia   . Anxiety   . Arthritis   . Carotid artery disease ([place])    Carotid US 1/18: bilateral ICA 1-39, R thyroid lobe nodule (1.9x2.2x3cm); numerous L thyroid lobe nodules - repeat 1 year  . Chronic kidney disease    M/W/F [facility]  . Chronic renal failure    post transplant  . Depression   . Diabetes mellitus    diet controlled  . EBV infection    Per [facility]  . Encephalitis    NMDA per NOB  . Esophagitis    Grade 1 Distal  . Focal seizure (HCC)    .  Last one 06/2016  . GERD (gastroesophageal reflux disease)   . Hemorrhoids   . History of blood transfusion   . History of kidney stones    Passed  . History of subdural hematoma   . Hx of cardiovascular stress test    Lexiscan Myoview (06/2013):  No ischemia, EF 66%; normal.  //  Myoview 12/17: EF 62, no ischemia or scar; Normal  . Hx of echocardiogram    a. Echocardiogram (06/2013):  Mod focal basal hypertrophy, EF 60-65%, normal wall motion, Gr 1 DD, mild AI, mildly dilated ascending aorta (41 mm),  mild LAE.; b.  Echo 9/16: mod LVH, EF 60-65%, no RWMA, Gr 1 DD, trivial AI, mild dilated ascending aorta, mild LAE  . Hyperkalemia   . Hyperlipidemia   . Hypertension   . Hypomagnesemia   . Left jugular vein thrombosis    Partial resolved per [facility]  . Metabolic acidosis   . Pneumonia   . Right jugular vein thrombosis    resolved from [facility]  . Seizures ([place])     Past Surgical History:  Procedure Laterality Date  . A/V FISTULAGRAM Left 06/04/2017   Procedure: A/V FISTULAGRAM;  Surgeon: Conrad Newark, MD;  Location: [place] [facility];  Service: Cardiovascular;  Laterality: Left;  . A/V FISTULAGRAM N/A 03/24/2019   Procedure: A/V FISTULAGRAM - left arm;  Surgeon: [name], MD;  Location: [place] [facility];  Service: Cardiovascular;  Laterality: N/A;  . ABDOMINAL HYSTERECTOMY    . AV FISTULA PLACEMENT  07/04/2005   [place] AV fistula  . AV FISTULA PLACEMENT  08/27/2005  . AV FISTULA PLACEMENT Left 04/29/2017   Procedure: Creation of Left Arm ARTERIOVENOUS BRACHIOCEPHALIC FISTULA;  Surgeon:  Elam Dutch, MD;  Location: [facility];  Service: Vascular;  Laterality: Left;  . AV FISTULA PLACEMENT Left 06/16/2017   Procedure: BRACHIO_BASCILIC ARTERIOVENOUS (AV) FISTULA CREATION;  Surgeon: Waynetta Sandy, MD;  Location: [place];  Service: Vascular;  Laterality: Left;  . AV FISTULA PLACEMENT W/ PTFE  08/27/2005  . BASCILIC VEIN TRANSPOSITION Left 06/16/2017   Procedure: BRACHIOCEPHALIC TRANSPOSITION  LEFT ARM;  Surgeon: Waynetta Sandy, MD;  Location: [place];  Service: Vascular;  Laterality: Left;  . BASCILIC VEIN TRANSPOSITION Left 09/01/2017   Procedure: BASILIC VEIN TRANSPOSITION SECOND STAGE;  Surgeon: Waynetta Sandy, MD;  Location: [place];  Service: Vascular;  Laterality: Left;  . BRAIN BIOPSY    . CESAREAN SECTION    . COLONOSCOPY W/ POLYPECTOMY    . DG AV DIALYSIS GRAFT DECLOT OR  07/24/2005   AV Gore-Tex graf  . DG AV DIALYSIS GRAFT DECLOT OR   Thrombosis right forearm, loop arteriovenous   Thrombosis right forearm, loop arteriovenous graft  . GASTROSTOMY TUBE PLACEMENT    . KIDNEY TRANSPLANT  2009   Both  . PERIPHERAL VASCULAR BALLOON ANGIOPLASTY Left 03/24/2019   Procedure: PERIPHERAL VASCULAR BALLOON ANGIOPLASTY;  Surgeon: [name], MD;  Location: [place] [facility];  Service: Cardiovascular;  Laterality: Left;  left AV fistula  . REMOVAL OF GASTROSTOMY TUBE    . REVISON OF ARTERIOVENOUS FISTULA Left 11/23/2018   Procedure: REVISION PLICATION OF ARTERIOVENOUS FISTULA;  Surgeon: Waynetta Sandy, MD;  Location: [place];  Service: Vascular;  Laterality: Left;  . THROMBECTOMY / ARTERIOVENOUS GRAFT REVISION  10/12/2006  . THROMBECTOMY / ARTERIOVENOUS GRAFT REVISION  10/16/2006    Allergies  Allergen Reactions  . Lisinopril Shortness Of Breath, Swelling and Other (See Comments)    Throat irritation also. Patient takes losartan and tolerates fine.  [name] [Oxaprozin] Hives and Dermatitis    Current Outpatient Medications  Medication Sig Dispense Refill  . acetaminophen (TYLENOL) 650 MG CR tablet Take 650-1,300 mg by mouth every 8 (eight) hours as needed for pain.     [name] albuterol (VENTOLIN HFA) 108 (90 Base) MCG/ACT inhaler INHALE 2 PUFFS INTO THE LUNGS EVERY 4 (FOUR) HOURS AS NEEDED FOR WHEEZING OR SHORTNESS OF BREATH. 18 g 3  . amLODipine (NORVASC) 10 MG tablet TAKE 1 TABLET BY MOUTH EVERY DAY (Patient taking differently: Take 10 mg by mouth daily. ) 90 tablet 1  . atorvastatin (LIPITOR) 40 MG tablet Take 1 tablet (40 mg total) by mouth daily. 90 tablet 3  . blood glucose meter kit and supplies KIT Dispense based on patient and insurance preference. Patient should test sugars twice daily as directed. Dx. E11.9 1 each 0  . cetirizine (ZYRTEC) 10 MG tablet TAKE 1 TABLET BY MOUTH EVERY DAY (Patient taking differently: Take 10 mg by mouth daily. ) 90 tablet 3  . ferric citrate (AURYXIA) 1 GM 210 MG(Fe) tablet Take  210 mg by mouth See admin instructions. Take 210 mg by mouth three times a day with meals and 210 mg two times a day with snacks    . gabapentin (NEURONTIN) 100 MG capsule TAKE 1 CAPSULE (100 MG TOTAL) BY MOUTH AT BEDTIME. 90 capsule 1  . glucose blood (ONETOUCH ULTRA) test strip Use as instructed to test sugars twice daily. Dx. E11.9 100 each 12  . hydrALAZINE (APRESOLINE) 50 MG tablet Take 1 tablet (50 mg total) by mouth in the morning and at bedtime. 60 tablet 3  . HYDROcodone-acetaminophen (NORCO/VICODIN) 5-325  MG tablet Take 1 tablet by mouth every 6 (six) hours as needed for moderate pain.     . isosorbide dinitrate (ISORDIL) 20 MG tablet TAKE 1 TABLET BY MOUTH THREE TIMES A DAY (Patient taking differently: Take 20 mg by mouth 3 (three) times daily. ) 270 tablet 2  . loratadine (CLARITIN) 10 MG tablet Take 10 mg by mouth daily as needed for allergies.    [name] meclizine (ANTIVERT) 25 MG tablet TAKE 1 TABLET (25 MG TOTAL) BY MOUTH 3 (THREE) TIMES DAILY AS NEEDED FOR DIZZINESS. 90 tablet 3  . multivitamin (RENA-VIT) TABS tablet Take 1 tablet by mouth every morning.   11  . nitroGLYCERIN (NITROSTAT) 0.4 MG SL tablet Place 0.4 mg under the tongue every 5 (five) minutes as needed for chest pain.     [name] omeprazole (PRILOSEC) 40 MG capsule TAKE 1 CAPSULE BY MOUTH EVERY DAY (Patient taking differently: Take 40 mg by mouth daily. ) 90 capsule 1  . predniSONE (DELTASONE) 10 MG tablet 3 tabs x3 days and then 2 tabs x3 days and then 1 tab x3 days.  Take w/ food. 18 tablet 0  . QUEtiapine (SEROQUEL) 100 MG tablet TAKE 1 TABLET BY MOUTH EVERYDAY AT BEDTIME (Patient taking differently: Take 100 mg by mouth at bedtime. ) 90 tablet 1  . traZODone (DESYREL) 50 MG tablet Take 50 mg by mouth at bedtime as needed for sleep.      Current Facility-Administered Medications  Medication Dose Route Frequency Provider Last Rate Last Admin  . methylPREDNISolone acetate (DEPO-MEDROL) injection 40 mg  40 mg Intra-articular Once  Hilts, Michael, MD        Family History  Problem Relation Age of Onset  . Kidney disease Paternal Aunt   . Heart disease Mother   . Heart disease Father   . Colon cancer Neg Hx     Social History   Socioeconomic History  . Marital status: Widowed    Spouse name: Not on file  . Number of children: 2  . Years of education: 35  . Highest education level: Not on file  Occupational History  . Occupation: Investment banker, operational   Tobacco Use  . Smoking status: Former Research scientist (life sciences)  . Smokeless tobacco: Never Used  . Tobacco comment: smoked in teens  Vaping Use  . Vaping Use: Never used  Substance and Sexual Activity  . Alcohol use: No  . Drug use: No  . Sexual activity: Never  Other Topics Concern  . Not on file  Social History Narrative   Denies caffeine use    Social Determinants of Health   Financial Resource Strain:   . Difficulty of Paying Living Expenses:   Food Insecurity:   . Worried About Charity fundraiser in the Last Year:   . Arboriculturist in the Last Year:   Transportation Needs:   . Film/video editor (Medical):   [name] Lack of Transportation (Non-Medical):   Physical Activity:   . Days of Exercise per Week:   . Minutes of Exercise per Session:   Stress:   . Feeling of Stress :   Social Connections:   . Frequency of Communication with Friends and Family:   . Frequency of Social Gatherings with Friends and Family:   . Attends Religious Services:   . Active Member of Clubs or Organizations:   . Attends Archivist Meetings:   [name] Marital Status:   Intimate Partner Violence:   . Fear of Current or  Ex-Partner:   . Emotionally Abused:   [name] Physically Abused:   . Sexually Abused:      ROS: _0  Positive   _1  Negative   _2  All sytems reviewed and are negative  Cardiac: _3  chest pain/pressure _4  SOB _5  DOE  Vascular: _6  pain in legs while walking _7  pain in feet when lying flat _8  hx of DVT _9  swelling in legs  Pulmonary: _10  productive  cough _11  wheezing  Neurologic: _12  weakness in _13  arms _14  legs _15  numbness in _16  arms _17  legs _18 difficulty speaking or slurred speech _19  temporary loss of vision in one eye _20  dizziness  Hematologic: _21  bleeding problems  GI _22  GERD  GU: _23  CKD/renal failure  _24  HD---_25  M/W/F _26  T/T/S  Psychiatric: _27  hx of major depression  Integumentary: _28  rashes _29  ulcers  Constitutional: _30  fever _31  chills  PHYSICAL EXAMINATION:  Today's Vitals   09/20/19 0832  BP: (!) 155/86  Pulse: 91  Resp: 20  Temp: 98.3 F (36.8 C)  TempSrc: Temporal  SpO2: 90%  Weight: 180 lb 1.6 oz (81.7 kg)  Height: _32  (1.676 m)   Body mass index is 29.07 kg/m.    General:  WDWN female in NAD Gait: Not observed HENT: WNL Pulmonary: normal non-labored breathing , without Rales, rhonchi,  wheezing Cardiac: regular, without  Murmur without carotid bruits Skin: without rashes, without ulcers  Vascular Exam/Pulses:   Right Left  Radial 2+ (normal) 2+ (normal)  Ulnar Unable to palpate  Unable to palpate    Extremities: LUA AVF with excellent thrill and easily palpable there is an aneurysmal area but there are no ulcers or scabs present.     Musculoskeletal: no muscle wasting or atrophy  Neurologic: A&O X 3; Speech is fluent/normal  Non-Invasive Vascular Imaging:   None today   ASSESSMENT/PLAN: 64 y.o. female with ESRD here for evaluation of her hemodialysis access  -pt with LUA AVF that was created in 2019 who presents today for evaluation of an aneurysmal area on the fistula.  There are no scabs present that are in danger of bleeding and skin is in tact.  Discussed with pt should she develop any scabs that are worrisome for bleeding, she should return to see [place] for evaluation.  I did discuss if her fistula was to ever bleed, how to hold pressure and call 911.   -would continue to use fistula and rotate sticks and try above    Leontine Locket, [facility] [phone]  Clinic MD:   Carlis Abbott

## 2019-09-21 DIAGNOSIS — I150 Renovascular hypertension: Secondary | ICD-10-CM | POA: Diagnosis not present

## 2019-09-21 DIAGNOSIS — E1129 Type 2 diabetes mellitus with other diabetic kidney complication: Secondary | ICD-10-CM | POA: Diagnosis not present

## 2019-09-21 DIAGNOSIS — Z111 Encounter for screening for respiratory tuberculosis: Secondary | ICD-10-CM | POA: Diagnosis not present

## 2019-09-21 DIAGNOSIS — D631 Anemia in chronic kidney disease: Secondary | ICD-10-CM | POA: Diagnosis not present

## 2019-09-21 DIAGNOSIS — N2581 Secondary hyperparathyroidism of renal origin: Secondary | ICD-10-CM | POA: Diagnosis not present

## 2019-09-21 DIAGNOSIS — N186 End stage renal disease: Secondary | ICD-10-CM | POA: Diagnosis not present

## 2019-09-21 DIAGNOSIS — L299 Pruritus, unspecified: Secondary | ICD-10-CM | POA: Diagnosis not present

## 2019-09-21 DIAGNOSIS — D689 Coagulation defect, unspecified: Secondary | ICD-10-CM | POA: Diagnosis not present

## 2019-09-21 DIAGNOSIS — Z992 Dependence on renal dialysis: Secondary | ICD-10-CM | POA: Diagnosis not present

## 2019-09-22 ENCOUNTER — Encounter: Payer: Self-pay | Admitting: Family Medicine

## 2019-09-22 ENCOUNTER — Ambulatory Visit (INDEPENDENT_AMBULATORY_CARE_PROVIDER_SITE_OTHER): Payer: Medicare HMO | Admitting: Family Medicine

## 2019-09-22 ENCOUNTER — Ambulatory Visit (INDEPENDENT_AMBULATORY_CARE_PROVIDER_SITE_OTHER): Payer: Medicare HMO

## 2019-09-22 ENCOUNTER — Other Ambulatory Visit: Payer: Self-pay

## 2019-09-22 ENCOUNTER — Ambulatory Visit: Payer: Self-pay

## 2019-09-22 VITALS — BP 140/78 | HR 89 | Ht 66.0 in | Wt 177.6 lb

## 2019-09-22 VITALS — BP 138/83 | HR 84 | Temp 97.9°F | Resp 16 | Ht 66.0 in | Wt 178.0 lb

## 2019-09-22 DIAGNOSIS — M25552 Pain in left hip: Secondary | ICD-10-CM

## 2019-09-22 DIAGNOSIS — I10 Essential (primary) hypertension: Secondary | ICD-10-CM

## 2019-09-22 DIAGNOSIS — G6289 Other specified polyneuropathies: Secondary | ICD-10-CM | POA: Diagnosis not present

## 2019-09-22 DIAGNOSIS — G629 Polyneuropathy, unspecified: Secondary | ICD-10-CM | POA: Insufficient documentation

## 2019-09-22 DIAGNOSIS — M160 Bilateral primary osteoarthritis of hip: Secondary | ICD-10-CM | POA: Diagnosis not present

## 2019-09-22 MED ORDER — GABAPENTIN 100 MG PO CAPS: 100.0000 mg | ORAL_CAPSULE | Freq: Three times a day (TID) | ORAL | 1 refills | Status: DC

## 2019-09-22 MED ORDER — HYDROCODONE-ACETAMINOPHEN 5-325 MG PO TABS: 1.0000 | ORAL_TABLET | Freq: Four times a day (QID) | ORAL | 0 refills | Status: DC | PRN

## 2019-09-22 NOTE — Assessment & Plan Note (Signed)
Chronic problem.  Improved since adding Hydralazine.  Pt reports feeling better.  No labs needed at this time.  No medication changes.

## 2019-09-22 NOTE — Progress Notes (Signed)
° ° °  Subjective:    CC: L lateral hip pain  I, Molly Weber, LAT, ATC, am serving as scribe for Dr. Lynne Leader.  HPI: Pt is a 64 y/o female presenting w/ c/o L lateral hip pain x approximately one month w/ no known MOI.  She notes that she did fall in March injuring her knee and ankle but not her hip.  She denies any radiating pain weakness or numbness fevers or chills.  Radiating pain: yes into her L lateral thigh, stopping at her knee L hip mechanical symptoms: No Aggravating factors: walking and L sidelying Treatments tried: prednisone dose pack; ice, heat, Tylenol, Advil  Pertinent review of Systems: No fevers or chills  Relevant historical information: Renal failure with dialysis.   Objective:    Vitals:   09/22/19 0847  BP: 140/78  Pulse: 89  SpO2: 94%   General: Well Developed, well nourished, and in no acute distress.   MSK: Left hip normal-appearing Normal motion. Minimally tender trochanter and ischial tuberosity.  Tender palpation at superior medial iliac crest near SI joint. Hip abduction strength is reduced 4/5 with pain.  External rotation strength is reduced 4/5 with pain. Antalgic gait.  Lab and Radiology Results  X-ray images left hip obtained today personally and independently reviewed Mild degenerative changes.  No acute fractures. Await formal radiology review   Impression and Recommendations:    Assessment and Plan: 64 y.o. female with left hip abductor tendinopathy.  Pain is located at origin of hip abductor external rotator.  Could be some SI joint dysfunction as well however this is less likely.  Discussed options with patient will proceed with physical therapy trial first.  If not better return to clinic in about 6 weeks and likely will proceed with trial of injection.  PDMP not reviewed this encounter. Orders Placed This Encounter  Procedures   DG HIP UNILAT W OR W/O PELVIS 2-3 VIEWS LEFT    Standing Status:   Future    Number of  Occurrences:   1    Standing Expiration Date:   10/23/2019    Order Specific Question:   Reason for Exam (SYMPTOM  OR DIAGNOSIS REQUIRED)    Answer:   L hip pain    Order Specific Question:   Preferred imaging location?    Answer:   [facility]   Ambulatory referral to Physical Therapy    Referral Priority:   Routine    Referral Type:   Physical Medicine    Referral Reason:   Specialty Services Required    Requested Specialty:   Physical Therapy   No orders of the defined types were placed in this encounter.   Discussed warning signs or symptoms. Please see discharge instructions. Patient expresses understanding.   The above documentation has been reviewed and is accurate and complete Lynne Leader, M.D.

## 2019-09-22 NOTE — Patient Instructions (Signed)
Thank you for coming in today. Plan for PT.  Get xray of your hip today.  Recheck in 6 weeks.  Return sooner if needed.  Let me know if you are having problems or worsening before then.    Hip Bursitis  Hip bursitis is swelling of a fluid-filled sac (bursa) in your hip joint. This swelling (inflammation) can be painful. This condition may come and go over time. What are the causes?  Injury to the hip.  Overuse of the muscles that surround the hip joint.  An earlier injury or surgery of the hip.  Arthritis or gout.  Diabetes.  Thyroid disease.  Infection.  In some cases, the cause may not be known. What are the signs or symptoms?  Mild or moderate pain in the hip area. Pain may get worse with movement.  Tenderness and swelling of the hip, especially on the outer side of the hip.  In rare cases, the bursa may become infected. This may cause: ? A fever. ? Warmth and redness in the area. Symptoms may come and go. How is this treated? This condition is treated by resting, icing, applying pressure (compression), and raising (elevating) the injured area. You may hear this called the RICE treatment. Treatment may also include:  Using crutches.  Draining fluid out of the bursa to help relieve swelling.  Giving a shot of (injecting) medicine that helps to reduce swelling (cortisone).  Other medicines if the bursa is infected. Follow these instructions at home: Managing pain, stiffness, and swelling   If told, put ice on the painful area. ? Put ice in a plastic bag. ? Place a towel between your skin and the bag. ? Leave the ice on for 20 minutes, 2-3 times a day. ? Raise (elevate) your hip above the level of your heart as much as you can without pain. To do this, try putting a pillow under your hips while you lie down. Stop if this causes pain. Activity  Return to your normal activities as told by your doctor. Ask your doctor what activities are safe for you.  Rest  and protect your hip as much as you can until you feel better. General instructions  Take over-the-counter and prescription medicines only as told by your doctor.  Wear wraps that put pressure on your hip (compression wraps) only as told by your doctor.  Do not use your hip to support your body weight until your doctor says that you can.  Use crutches as told by your doctor.  Gently rub and stretch your injured area as often as is comfortable.  Keep all follow-up visits as told by your doctor. This is important. How is this prevented?  Exercise regularly, as told by your doctor.  Warm up and stretch before being active.  Cool down and stretch after being active.  Avoid activities that bother your hip or cause pain.  Avoid sitting down for long periods at a time. Contact a doctor if:  You have a fever.  You get new symptoms.  You have trouble walking.  You have trouble doing everyday activities.  You have pain that gets worse.  You have pain that does not get better with medicine.  You get red skin on your hip area.  You get a feeling of warmth in your hip area. Get help right away if:  You cannot move your hip.  You have very bad pain. Summary  Hip bursitis is swelling of a fluid-filled sac (bursa) in your hip.  Hip bursitis can be painful.  Symptoms often come and go over time.  This condition is treated with rest, ice, compression, elevation, and medicines. This information is not intended to replace advice given to you by your health care provider. Make sure you discuss any questions you have with your health care provider. Document Revised: 11/16/2017 Document Reviewed: 11/16/2017 Elsevier Patient Education  [place].

## 2019-09-22 NOTE — Progress Notes (Signed)
   Subjective:    Patient ID: [name] [name], female    DOB: [birth year], 64 y.o.   MRN: [id number]  HPI HTN- BP is much better today w/ addition of Hydralazine BID to her Amlodipine 10mg  daily.  Pt reports feeling 'a whole lot better'.  No CP, SOB above baseline, HAs, visual changes, edema.  L hip pain- saw Sports Med this AM.  Plan is for PT and then an injxn if needed.  No relief w/ Prednisone.  Pt has pain w/ ambulation.  'nothing's working'.  Neuropathy- 'they're getting worse'.  Occurring throughout the day.  Currently on 100mg  Gabapentin QHS.   Review of Systems For ROS see HPI   This visit occurred during the SARS-CoV-2 public health emergency.  Safety protocols were in place, including screening questions prior to the visit, additional usage of staff PPE, and extensive cleaning of exam room while observing appropriate contact time as indicated for disinfecting solutions.       Objective:   Physical Exam Vitals reviewed.  Constitutional:      General: She is not in acute distress.    Appearance: She is well-developed.  HENT:     Head: Normocephalic and atraumatic.  Eyes:     Conjunctiva/sclera: Conjunctivae normal.     Pupils: Pupils are equal, round, and reactive to light.  Neck:     Thyroid: No thyromegaly.  Cardiovascular:     Rate and Rhythm: Normal rate and regular rhythm.     Heart sounds: Normal heart sounds. No murmur heard.   Pulmonary:     Effort: Pulmonary effort is normal. No respiratory distress.     Breath sounds: Normal breath sounds.  Abdominal:     General: There is no distension.     Palpations: Abdomen is soft.     Tenderness: There is no abdominal tenderness.  Musculoskeletal:     Cervical back: Normal range of motion and neck supple.  Lymphadenopathy:     Cervical: No cervical adenopathy.  Skin:    General: Skin is warm and dry.  Neurological:     Mental Status: She is alert and oriented to person, place, and time.  Psychiatric:         Behavior: Behavior normal.           Assessment & Plan:  Hip pain- saw sports med this AM and plan is for PT and then possibly injxn.  She reports pain is very limiting and she has a difficult time walking.  Is able to take Hydrocodone w/o difficulty.  Will provide short supply to use for severe pain.  Pt expressed understanding and is in agreement w/ plan.

## 2019-09-22 NOTE — Assessment & Plan Note (Signed)
Deteriorated.  Pt reports feet are more painful despite starting Gabapentin.  Will increase frequency to TID and if not helpful, will increase dose.  Pt expressed understanding and is in agreement w/ plan.

## 2019-09-22 NOTE — Patient Instructions (Signed)
Follow up as scheduled CONTINUE the Amlodipine and the Hydralazine for BP- it's working! INCREASE the Gabapentin to 3x/day USE the hydrocodone as needed for severe pain ICE! Call with any questions or concerns Hang in there!!

## 2019-09-23 ENCOUNTER — Telehealth: Payer: Self-pay | Admitting: Family Medicine

## 2019-09-23 DIAGNOSIS — N186 End stage renal disease: Secondary | ICD-10-CM | POA: Diagnosis not present

## 2019-09-23 DIAGNOSIS — L299 Pruritus, unspecified: Secondary | ICD-10-CM | POA: Diagnosis not present

## 2019-09-23 DIAGNOSIS — D631 Anemia in chronic kidney disease: Secondary | ICD-10-CM | POA: Diagnosis not present

## 2019-09-23 DIAGNOSIS — Z780 Asymptomatic menopausal state: Secondary | ICD-10-CM

## 2019-09-23 DIAGNOSIS — D689 Coagulation defect, unspecified: Secondary | ICD-10-CM | POA: Diagnosis not present

## 2019-09-23 DIAGNOSIS — N2581 Secondary hyperparathyroidism of renal origin: Secondary | ICD-10-CM | POA: Diagnosis not present

## 2019-09-23 DIAGNOSIS — Z992 Dependence on renal dialysis: Secondary | ICD-10-CM | POA: Diagnosis not present

## 2019-09-23 DIAGNOSIS — E1129 Type 2 diabetes mellitus with other diabetic kidney complication: Secondary | ICD-10-CM | POA: Diagnosis not present

## 2019-09-23 NOTE — Telephone Encounter (Signed)
Decreased bone mineral density seen on x-ray.  DEXA scan ordered to evaluate bone mineral density and evaluate for osteoporosis.

## 2019-09-23 NOTE — Progress Notes (Signed)
X-ray hip shows no severe arthritis or fractures.  It does show decreased bone mineral density.  I have ordered a DEXA scan to evaluate the bone mineral density better and look for osteoporosis.  You should hear soon about scheduling the DEXA scan.

## 2019-09-26 DIAGNOSIS — Z992 Dependence on renal dialysis: Secondary | ICD-10-CM | POA: Diagnosis not present

## 2019-09-26 DIAGNOSIS — D509 Iron deficiency anemia, unspecified: Secondary | ICD-10-CM | POA: Diagnosis not present

## 2019-09-26 DIAGNOSIS — N186 End stage renal disease: Secondary | ICD-10-CM | POA: Diagnosis not present

## 2019-09-26 DIAGNOSIS — N2581 Secondary hyperparathyroidism of renal origin: Secondary | ICD-10-CM | POA: Diagnosis not present

## 2019-09-27 ENCOUNTER — Ambulatory Visit (INDEPENDENT_AMBULATORY_CARE_PROVIDER_SITE_OTHER): Payer: Medicare HMO

## 2019-09-27 VITALS — Ht 66.0 in | Wt 178.0 lb

## 2019-09-27 DIAGNOSIS — Z0000 Encounter for general adult medical examination without abnormal findings: Secondary | ICD-10-CM | POA: Diagnosis not present

## 2019-09-27 NOTE — Progress Notes (Signed)
Subjective:   [name] [name] is a 64 y.o. female who presents for an Initial Medicare Annual Wellness Visit.  I connected with Julio today by telephone and verified that I am speaking with the correct person using two identifiers. Location patient: home Location provider: work Persons participating in the virtual visit: patient, Marine scientist.    I discussed the limitations, risks, security and privacy concerns of performing an evaluation and management service by telephone and the availability of in person appointments. I also discussed with the patient that there may be a patient responsible charge related to this service. The patient expressed understanding and verbally consented to this telephonic visit.    Interactive audio and video telecommunications were attempted between this provider and patient, however failed, due to patient having technical difficulties OR patient did not have access to video capability.  We continued and completed visit with audio only.  Some vital signs may be absent or patient reported.   Time Spent with patient on telephone encounter: 30 minutes  Review of Systems      Cardiac Risk Factors include: diabetes mellitus;dyslipidemia;hypertension;sedentary lifestyle     Objective:    Today's Vitals   09/27/19 1330  Weight: 178 lb (80.7 kg)  Height: _0  (1.676 m)  PainSc: 8    Body mass index is 28.73 kg/m.  Advanced Directives 09/27/2019 07/29/2019 07/29/2019 03/24/2019 08/11/2018 01/01/2018 12/16/2017  Does Patient Have a Medical Advance Directive? Yes Yes _1   Type of Paramedic of [place];Living will [place] (No Data) - - - -  Does patient want to make changes to medical advance directive? - No - Patient declined No - Guardian declined - - - -  Copy of [place] in Chart? No - copy requested - - - - - -  Would patient like information on creating a medical advance directive? -  No - Patient declined No - Patient declined No - Patient declined No - Patient declined - -    Current Medications (verified) Outpatient Encounter Medications as of 09/27/2019  Medication Sig  . acetaminophen (TYLENOL) 650 MG CR tablet Take 650-1,300 mg by mouth every 8 (eight) hours as needed for pain.   [name] albuterol (VENTOLIN HFA) 108 (90 Base) MCG/ACT inhaler INHALE 2 PUFFS INTO THE LUNGS EVERY 4 (FOUR) HOURS AS NEEDED FOR WHEEZING OR SHORTNESS OF BREATH.  [name] amLODipine (NORVASC) 10 MG tablet TAKE 1 TABLET BY MOUTH EVERY DAY (Patient taking differently: Take 10 mg by mouth daily. )  . atorvastatin (LIPITOR) 40 MG tablet Take 1 tablet (40 mg total) by mouth daily.  . blood glucose meter kit and supplies KIT Dispense based on patient and insurance preference. Patient should test sugars twice daily as directed. Dx. E11.9  . cetirizine (ZYRTEC) 10 MG tablet TAKE 1 TABLET BY MOUTH EVERY DAY (Patient taking differently: Take 10 mg by mouth daily. )  . ferric citrate (AURYXIA) 1 GM 210 MG(Fe) tablet Take 210 mg by mouth See admin instructions. Take 210 mg by mouth three times a day with meals and 210 mg two times a day with snacks  . gabapentin (NEURONTIN) 100 MG capsule Take 1 capsule (100 mg total) by mouth 3 (three) times daily.  [name] glucose blood (ONETOUCH ULTRA) test strip Use as instructed to test sugars twice daily. Dx. E11.9  . hydrALAZINE (APRESOLINE) 50 MG tablet Take 1 tablet (50 mg total) by mouth in the morning and at bedtime.  [name]  HYDROcodone-acetaminophen (NORCO/VICODIN) 5-325 MG tablet Take 1 tablet by mouth every 6 (six) hours as needed for moderate pain.  . isosorbide dinitrate (ISORDIL) 20 MG tablet TAKE 1 TABLET BY MOUTH THREE TIMES A DAY (Patient taking differently: Take 20 mg by mouth 3 (three) times daily. )  . loratadine (CLARITIN) 10 MG tablet Take 10 mg by mouth daily as needed for allergies.  [name] meclizine (ANTIVERT) 25 MG tablet TAKE 1 TABLET (25 MG TOTAL) BY MOUTH 3 (THREE) TIMES  DAILY AS NEEDED FOR DIZZINESS.  . multivitamin (RENA-VIT) TABS tablet Take 1 tablet by mouth every morning.   . nitroGLYCERIN (NITROSTAT) 0.4 MG SL tablet Place 0.4 mg under the tongue every 5 (five) minutes as needed for chest pain.   [name] omeprazole (PRILOSEC) 40 MG capsule TAKE 1 CAPSULE BY MOUTH EVERY DAY (Patient taking differently: Take 40 mg by mouth daily. )  . QUEtiapine (SEROQUEL) 100 MG tablet TAKE 1 TABLET BY MOUTH EVERYDAY AT BEDTIME (Patient taking differently: Take 100 mg by mouth at bedtime. )  . traZODone (DESYREL) 50 MG tablet Take 50 mg by mouth at bedtime as needed for sleep.    Facility-Administered Encounter Medications as of 09/27/2019  Medication  . methylPREDNISolone acetate (DEPO-MEDROL) injection 40 mg    Allergies (verified) Lisinopril and Daypro [oxaprozin]   History: Past Medical History:  Diagnosis Date  . Adenomatous colon polyp 04/1998  . Anemia   . Anxiety   . Arthritis   . Carotid artery disease ([place])    Carotid US 1/18: bilateral ICA 1-39, R thyroid lobe nodule (1.9x2.2x3cm); numerous L thyroid lobe nodules - repeat 1 year  . Chronic kidney disease    M/W/[facility]  . Chronic renal failure    post transplant  . Depression   . Diabetes mellitus    diet controlled  . EBV infection    Per [facility]  . Encephalitis    NMDA per [facility]  . Esophagitis    Grade 1 Distal  . Focal seizure ([facility])    .  Last one 06/2016  . GERD (gastroesophageal reflux disease)   . Hemorrhoids   . History of blood transfusion   . History of kidney stones    Passed  . History of subdural hematoma   . Hx of cardiovascular stress test    Lexiscan Myoview (06/2013):  No ischemia, EF 66%; normal.  //  Myoview 12/17: EF 62, no ischemia or scar; Normal  . Hx of echocardiogram    a. Echocardiogram (06/2013):  Mod focal basal hypertrophy, EF 60-65%, normal wall motion, Gr 1 DD, mild AI, mildly dilated ascending aorta (41 mm), mild LAE.; b.  Echo 9/16: mod LVH, EF 60-65%, no RWMA, Gr 1  DD, trivial AI, mild dilated ascending aorta, mild LAE  . Hyperkalemia   . Hyperlipidemia   . Hypertension   . Hypomagnesemia   . Left jugular vein thrombosis    Partial resolved per [facility]  . Metabolic acidosis   . Pneumonia   . Right jugular vein thrombosis    resolved from [facility]  . Seizures ([place])    Past Surgical History:  Procedure Laterality Date  . A/V FISTULAGRAM Left 06/04/2017   Procedure: A/V FISTULAGRAM;  Surgeon: Conrad Craig, MD;  Location: [place] [facility];  Service: Cardiovascular;  Laterality: Left;  . A/V FISTULAGRAM N/A 03/24/2019   Procedure: A/V FISTULAGRAM - left arm;  Surgeon: [name], MD;  Location: [place] [facility];  Service: Cardiovascular;  Laterality:  N/A;  . ABDOMINAL HYSTERECTOMY    . AV FISTULA PLACEMENT  07/04/2005   [place] AV fistula  . AV FISTULA PLACEMENT  08/27/2005  . AV FISTULA PLACEMENT Left 04/29/2017   Procedure: Creation of Left Arm ARTERIOVENOUS BRACHIOCEPHALIC FISTULA;  Surgeon: Elam Dutch, MD;  Location: [place];  Service: Vascular;  Laterality: Left;  . AV FISTULA PLACEMENT Left 06/16/2017   Procedure: BRACHIO_BASCILIC ARTERIOVENOUS (AV) FISTULA CREATION;  Surgeon: Waynetta Sandy, MD;  Location: [place];  Service: Vascular;  Laterality: Left;  . AV FISTULA PLACEMENT W/ PTFE  08/27/2005  . BASCILIC VEIN TRANSPOSITION Left 06/16/2017   Procedure: BRACHIOCEPHALIC TRANSPOSITION  LEFT ARM;  Surgeon: Waynetta Sandy, MD;  Location: [place];  Service: Vascular;  Laterality: Left;  . BASCILIC VEIN TRANSPOSITION Left 09/01/2017   Procedure: BASILIC VEIN TRANSPOSITION SECOND STAGE;  Surgeon: Waynetta Sandy, MD;  Location: [place];  Service: Vascular;  Laterality: Left;  . BRAIN BIOPSY    . CESAREAN SECTION    . COLONOSCOPY W/ POLYPECTOMY    . DG AV DIALYSIS GRAFT DECLOT OR  07/24/2005   AV Gore-Tex graf  . DG AV DIALYSIS GRAFT DECLOT OR  Thrombosis right forearm, loop arteriovenous   Thrombosis right  forearm, loop arteriovenous graft  . GASTROSTOMY TUBE PLACEMENT    . KIDNEY TRANSPLANT  2009   Both  . PERIPHERAL VASCULAR BALLOON ANGIOPLASTY Left 03/24/2019   Procedure: PERIPHERAL VASCULAR BALLOON ANGIOPLASTY;  Surgeon: [name], MD;  Location: [place] [facility];  Service: Cardiovascular;  Laterality: Left;  left AV fistula  . REMOVAL OF GASTROSTOMY TUBE    . REVISON OF ARTERIOVENOUS FISTULA Left 11/23/2018   Procedure: REVISION PLICATION OF ARTERIOVENOUS FISTULA;  Surgeon: Waynetta Sandy, MD;  Location: [place];  Service: Vascular;  Laterality: Left;  . THROMBECTOMY / ARTERIOVENOUS GRAFT REVISION  10/12/2006  . THROMBECTOMY / ARTERIOVENOUS GRAFT REVISION  10/16/2006   Family History  Problem Relation Age of Onset  . Kidney disease Paternal Aunt   . Heart disease Mother   . Heart disease Father   . Colon cancer Neg Hx    Social History   Socioeconomic History  . Marital status: Widowed    Spouse name: Not on file  . Number of children: 2  . Years of education: 80  . Highest education level: Not on file  Occupational History  . Occupation: Retired  Tobacco Use  . Smoking status: Former Research scientist (life sciences)  . Smokeless tobacco: Never Used  . Tobacco comment: smoked in teens  Vaping Use  . Vaping Use: Never used  Substance and Sexual Activity  . Alcohol use: No  . Drug use: No  . Sexual activity: Never  Other Topics Concern  . Not on file  Social History Narrative   Denies caffeine use    Social Determinants of Health   Financial Resource Strain: Low Risk   . Difficulty of Paying Living Expenses: Not hard at all  Food Insecurity: No Food Insecurity  . Worried About Charity fundraiser in the Last Year: Never true  . Ran Out of Food in the Last Year: Never true  Transportation Needs: No Transportation Needs  . Lack of Transportation (Medical): No  . Lack of Transportation (Non-Medical): No  Physical Activity: Inactive  . Days of Exercise per Week: 0 days    . Minutes of Exercise per Session: 0 min  Stress: No Stress Concern Present  . Feeling of Stress : Not at all  Social Connections: Moderately Isolated  . Frequency of Communication with Friends and Family: Once a week  . Frequency of Social Gatherings with Friends and Family: Once a week  . Attends Religious Services: 1 to 4 times per year  . Active Member of Clubs or Organizations: Yes  . Attends Archivist Meetings: 1 to 4 times per year  . Marital Status: Widowed    Tobacco Counseling Counseling given: Not Answered Comment: smoked in teens   Clinical Intake:  Pre-visit preparation completed: Yes  Pain : 0-10 Pain Score: 8  Pain Type: Chronic pain Pain Location: Hip Pain Orientation: Left Pain Onset: More than a month ago Pain Frequency: Intermittent Pain Relieving Factors: None  Pain Relieving Factors: None  Nutritional Status: BMI 25 -29 Overweight Nutritional Risks: None Diabetes: Yes CBG done?: No Did pt. bring in CBG monitor from home?: No (virtual visit)  How often do you need to have someone help you when you read instructions, pamphlets, or other written materials from your doctor or pharmacy?: 1 - Never What is the last grade level you completed in school?: 2 yrs of college  Nutrition Risk Assessment:  Has the patient had any N/V/D within the last 2 months?  No  Does the patient have any non-healing wounds?  No  Has the patient had any unintentional weight loss or weight gain?  No   Diabetes:  Is the patient diabetic?  Yes  If diabetic, was a CBG obtained today?  No  Did the patient bring in their glucometer from home?  No virtual visit How often do you monitor your CBG's? daily.   Financial Strains and Diabetes Management:  Are you having any financial strains with the device, your supplies or your medication? No .  Does the patient want to be seen by Chronic Care Management for management of their diabetes?  No  Would the patient like  to be referred to a Nutritionist or for Diabetic Management?  No   Diabetic Exams:  Diabetic Eye Exam: Completed 02/22/2019.   Diabetic Foot Exam: Completed 07/07/2019.   Interpreter Needed?: No  Information entered by :: Caroleen Hamman LPN   Activities of Daily Living In your present state of health, do you have any difficulty performing the following activities: 09/27/2019 07/29/2019  Hearing? N N  Vision? N N  Difficulty concentrating or making decisions? N N  Walking or climbing stairs? N N  Dressing or bathing? N N  Doing errands, shopping? N N  Preparing Food and eating ? N -  Using the Toilet? N -  In the past six months, have you accidently leaked urine? N -  Do you have problems with loss of bowel control? N -  Managing your Medications? N -  Managing your Finances? N -  Housekeeping or managing your Housekeeping? N -  Some recent data might be hidden     Immunizations and Health Maintenance Immunization History  Administered Date(s) Administered  . Hepatitis B, adult 04/17/2017, 05/20/2017, 06/19/2017, 10/16/2017, 04/21/2018, 04/27/2019  . Influenza,inj,Quad PF,6+ Mos 10/28/2015, 05/07/2017, 12/24/2017, 12/20/2018  . PFIZER SARS-COV-2 Vaccination 06/04/2019, 06/25/2019  . Pneumococcal Conjugate-13 07/14/2018  . Pneumococcal Polysaccharide-23 10/01/2015  . Tdap 02/03/2019   Health Maintenance Due  Topic Date Due  . COLONOSCOPY  06/20/2019    Patient Care Team: Midge Minium, MD as PCP - General (Family Medicine) Ladene Artist, MD as Consulting Physician (Gastroenterology) Zadie Rhine Clent Demark, MD as Consulting Physician (Ophthalmology) Everlene Farrier, MD as Consulting Physician (  Obstetrics and Gynecology) Mauricia Area, MD as Consulting Physician (Nephrology) Nahser, Wonda Cheng, MD as Consulting Physician (Cardiology) [facility] as Referring Physician (Nephrology) Waynetta Sandy, MD as Consulting Physician  (Vascular Surgery)  Indicate any recent Medical Services you may have received from other than [facility] providers in the past year (date may be approximate).     Assessment:   This is a routine wellness examination for Tyshawna.  Hearing/Vision screen  Hearing Screening   _0  _1  _2  _3  _4  _5  _6  _7  _8   Right ear:           Left ear:           Comments: No issues  Vision Screening Comments: Reading glasses. Last eye exam 02/2019  Dietary issues and exercise activities discussed: Current Exercise Habits: The patient does not participate in regular exercise at present, Exercise limited by: orthopedic condition(s)  Goals Addressed            This Visit's Progress   . Patient Stated       Would like to start walking more      Depression Screen PHQ 2/9 Scores 09/27/2019 07/07/2019 03/29/2019 12/07/2018 06/10/2018 03/09/2018 12/24/2017  PHQ - 2 Score 0 0 0 0 0 0 0  PHQ- 9 Score - 0 0 0 - 0 0    Fall Risk Fall Risk  09/27/2019 07/07/2019 07/07/2019 03/29/2019 12/07/2018  Falls in the past year? 1 0 0 0 0  Number falls in past yr: 0 0 0 0 0  Injury with Fall? 1 0 0 0 0  Follow up Falls prevention discussed Falls evaluation completed Falls evaluation completed Falls evaluation completed -    FALL RISK PREVENTION PERTAINING TO THE HOME:  Any stairs in or around the home? Yes  If so, are there any without handrails? No   Home free of loose throw rugs in walkways, pet beds, electrical cords, etc? Yes  Adequate lighting in your home to reduce risk of falls? Yes   ASSISTIVE DEVICES UTILIZED TO PREVENT FALLS:  Life alert? No  Use of a cane, walker or w/c? No  Grab bars in the bathroom? Yes  Shower chair or bench in shower? No  Elevated toilet seat or a handicapped toilet? No    TIMED UP AND GO:  Was the test performed? No . Virtual visit    Cognitive Function: MMSE - Mini Mental State Exam 06/30/2016  Orientation to time 5  Orientation to Place 5    Registration 3  Attention/ Calculation 4  Recall 0  Language- name 2 objects 2  Language- repeat 1  Language- follow 3 step command 3  Language- read & follow direction 1  Write a sentence 1  Copy design 1  Total score 26     6CIT Screen 09/27/2019  What Year? 0 points  What month? 0 points  What time? 0 points  Count back from 20 0 points  Months in reverse 0 points  Repeat phrase 2 points  Total Score 2    Screening Tests Health Maintenance  Topic Date Due  . COLONOSCOPY  06/20/2019  . INFLUENZA VACCINE  10/23/2019  . HEMOGLOBIN A1C  01/06/2020  . OPHTHALMOLOGY EXAM  02/22/2020  . FOOT EXAM  07/06/2020  . MAMMOGRAM  05/09/2021  . TETANUS/TDAP  02/02/2029  . PNEUMOCOCCAL POLYSACCHARIDE VACCINE AGE 63-64 HIGH RISK  Completed  . COVID-19 Vaccine  Completed  . Hepatitis C Screening  Completed  . HIV Screening  Completed    Qualifies for Shingles Vaccine? Yes  Zostavax completed no. Due for Shingrix. Education has been provided regarding the importance of this vaccine. Pt has been advised to call insurance company to determine out of pocket expense. Advised may also receive vaccine at local pharmacy or Health Dept. Verbalized acceptance and understanding.  Tdap: Up to date  Flu Vaccine: Up to date  Pneumococcal Vaccine: Completed vaccines  COVID-19 vaccine: Vaccines received  Cancer Screenings:  Colorectal Screening: Completed 06/19/2009. Patient is out of town at this time & she will call the office when she is ready to have colonoscopy done.  Mammogram: Completed 05/10/2019. Repeat every year.   Lung Cancer Screening: (Low Dose CT Chest recommended if Age 69-80 years, 30 pack-year currently smoking OR have quit w/in 15years.) does not qualify.    Additional Screening:  Hepatitis C Screening:  Completed 07/03/2016   Dental Screening: Recommended annual dental exams for proper oral hygiene  Community Resource Referral:  CRR required this visit?  No        Plan:  I have personally reviewed and addressed the Medicare Annual Wellness questionnaire and have noted the following in the patient's chart:  A. Medical and social history B. Use of alcohol, tobacco or illicit drugs  C. Current medications and supplements D. Functional ability and status E.  Nutritional status F.  Physical activity G. Advance directives H. List of other physicians I.  Hospitalizations, surgeries, and ER visits in previous 12 months J.  [place] such as hearing and vision if needed, cognitive and depression L. Referrals and appointments   In addition, I have reviewed and discussed with patient certain preventive protocols, quality metrics, and best practice recommendations. A written personalized care plan for preventive services as well as general preventive health recommendations were provided to patient.  Due to this being a telephonic visit, the after visit summary with patients personalized plan was offered to patient via mail or my-chart.  Patient would like to access on my-chart.   Signed,    Marta Antu, LPN   0/05/129  Nurse Health Advisor    Nurse Notes: None

## 2019-09-27 NOTE — Patient Instructions (Signed)
[name] [name] , Thank you for taking time to come for your Medicare Wellness Visit. I appreciate your ongoing commitment to your health goals. Please review the following plan we discussed and let me know if I can assist you in the future.   Screening recommendations/referrals: Colonoscopy: Completed 06/19/2009-Due- Please call the office when you are ready to schedule Mammogram: Completed 05/10/2019-Due 05/09/2020 Bone Density: Not indicated Recommended yearly ophthalmology/optometry visit for glaucoma screening and checkup Recommended yearly dental visit for hygiene and checkup  Vaccinations: Influenza vaccine: Up to date- Due-11/2019 Pneumococcal vaccine: Completed vaccines Tdap vaccine: Up to date-Due 02/02/2029 Shingles vaccine: Discuss with pharmacy  Covid-19: Completed vaccines  Advanced directives: Please bring a copy to your next office visit  Conditions/risks identified: See problem list  Next appointment: Follow up in one year for your annual wellness visit. 10/02/2020 @ 1:30  Preventive Care 40-64 Years, Female Preventive care refers to lifestyle choices and visits with your health care provider that can promote health and wellness. What does preventive care include?  A yearly physical exam. This is also called an annual well check.  Dental exams once or twice a year.  Routine eye exams. Ask your health care provider how often you should have your eyes checked.  Personal lifestyle choices, including:  Daily care of your teeth and gums.  Regular physical activity.  Eating a healthy diet.  Avoiding tobacco and drug use.  Limiting alcohol use.  Practicing safe sex.  Taking low-dose aspirin daily starting at age 43.  Taking vitamin and mineral supplements as recommended by your health care provider. What happens during an annual well check? The services and screenings done by your health care provider during your annual well check will depend on your age, overall  health, lifestyle risk factors, and family history of disease. Counseling  Your health care provider may ask you questions about your:  Alcohol use.  Tobacco use.  Drug use.  Emotional well-being.  Home and relationship well-being.  Sexual activity.  Eating habits.  Work and work Statistician.  Method of birth control.  Menstrual cycle.  Pregnancy history. Screening  You may have the following tests or measurements:  Height, weight, and BMI.  Blood pressure.  Lipid and cholesterol levels. These may be checked every 5 years, or more frequently if you are over 75 years old.  Skin check.  Lung cancer screening. You may have this screening every year starting at age 79 if you have a 30-pack-year history of smoking and currently smoke or have quit within the past 15 years.  Fecal occult blood test (FOBT) of the stool. You may have this test every year starting at age 33.  Flexible sigmoidoscopy or colonoscopy. You may have a sigmoidoscopy every 5 years or a colonoscopy every 10 years starting at age 59.  Hepatitis C blood test.  Hepatitis B blood test.  Sexually transmitted disease (STD) testing.  Diabetes screening. This is done by checking your blood sugar (glucose) after you have not eaten for a while (fasting). You may have this done every 1-3 years.  Mammogram. This may be done every 1-2 years. Talk to your health care provider about when you should start having regular mammograms. This may depend on whether you have a family history of breast cancer.  BRCA-related cancer screening. This may be done if you have a family history of breast, ovarian, tubal, or peritoneal cancers.  Pelvic exam and Pap test. This may be done every 3 years starting at age 74.  Starting at age 12, this may be done every 5 years if you have a Pap test in combination with an HPV test.  Bone density scan. This is done to screen for osteoporosis. You may have this scan if you are at high  risk for osteoporosis. Discuss your test results, treatment options, and if necessary, the need for more tests with your health care provider. Vaccines  Your health care provider may recommend certain vaccines, such as:  Influenza vaccine. This is recommended every year.  Tetanus, diphtheria, and acellular pertussis (Tdap, Td) vaccine. You may need a Td booster every 10 years.  Zoster vaccine. You may need this after age 49.  Pneumococcal 13-valent conjugate (PCV13) vaccine. You may need this if you have certain conditions and were not previously vaccinated.  Pneumococcal polysaccharide (PPSV23) vaccine. You may need one or two doses if you smoke cigarettes or if you have certain conditions. Talk to your health care provider about which screenings and vaccines you need and how often you need them. This information is not intended to replace advice given to you by your health care provider. Make sure you discuss any questions you have with your health care provider. Document Released: 04/06/2015 Document Revised: 11/28/2015 Document Reviewed: 01/09/2015 Elsevier Interactive Patient Education  2017 [place] Prevention in the Home Falls can cause injuries. They can happen to people of all ages. There are many things you can do to make your home safe and to help prevent falls. What can I do on the outside of my home?  Regularly fix the edges of walkways and driveways and fix any cracks.  Remove anything that might make you trip as you walk through a door, such as a raised step or threshold.  Trim any bushes or trees on the path to your home.  Use bright outdoor lighting.  Clear any walking paths of anything that might make someone trip, such as rocks or tools.  Regularly check to see if handrails are loose or broken. Make sure that both sides of any steps have handrails.  Any raised decks and porches should have guardrails on the edges.  Have any leaves, snow, or ice  cleared regularly.  Use sand or salt on walking paths during winter.  Clean up any spills in your garage right away. This includes oil or grease spills. What can I do in the bathroom?  Use night lights.  Install grab bars by the toilet and in the tub and shower. Do not use towel bars as grab bars.  Use non-skid mats or decals in the tub or shower.  If you need to sit down in the shower, use a plastic, non-slip stool.  Keep the floor dry. Clean up any water that spills on the floor as soon as it happens.  Remove soap buildup in the tub or shower regularly.  Attach bath mats securely with double-sided non-slip rug tape.  Do not have throw rugs and other things on the floor that can make you trip. What can I do in the bedroom?  Use night lights.  Make sure that you have a light by your bed that is easy to reach.  Do not use any sheets or blankets that are too big for your bed. They should not hang down onto the floor.  Have a firm chair that has side arms. You can use this for support while you get dressed.  Do not have throw rugs and other things on the  floor that can make you trip. What can I do in the kitchen?  Clean up any spills right away.  Avoid walking on wet floors.  Keep items that you use a lot in easy-to-reach places.  If you need to reach something above you, use a strong step stool that has a grab bar.  Keep electrical cords out of the way.  Do not use floor polish or wax that makes floors slippery. If you must use wax, use non-skid floor wax.  Do not have throw rugs and other things on the floor that can make you trip. What can I do with my stairs?  Do not leave any items on the stairs.  Make sure that there are handrails on both sides of the stairs and use them. Fix handrails that are broken or loose. Make sure that handrails are as long as the stairways.  Check any carpeting to make sure that it is firmly attached to the stairs. Fix any carpet that  is loose or worn.  Avoid having throw rugs at the top or bottom of the stairs. If you do have throw rugs, attach them to the floor with carpet tape.  Make sure that you have a light switch at the top of the stairs and the bottom of the stairs. If you do not have them, ask someone to add them for you. What else can I do to help prevent falls?  Wear shoes that:  Do not have high heels.  Have rubber bottoms.  Are comfortable and fit you well.  Are closed at the toe. Do not wear sandals.  If you use a stepladder:  Make sure that it is fully opened. Do not climb a closed stepladder.  Make sure that both sides of the stepladder are locked into place.  Ask someone to hold it for you, if possible.  Clearly mark and make sure that you can see:  Any grab bars or handrails.  First and last steps.  Where the edge of each step is.  Use tools that help you move around (mobility aids) if they are needed. These include:  Canes.  Walkers.  Scooters.  Crutches.  Turn on the lights when you go into a dark area. Replace any light bulbs as soon as they burn out.  Set up your furniture so you have a clear path. Avoid moving your furniture around.  If any of your floors are uneven, fix them.  If there are any pets around you, be aware of where they are.  Review your medicines with your doctor. Some medicines can make you feel dizzy. This can increase your chance of falling. Ask your doctor what other things that you can do to help prevent falls. This information is not intended to replace advice given to you by your health care provider. Make sure you discuss any questions you have with your health care provider. Document Released: 01/04/2009 Document Revised: 08/16/2015 Document Reviewed: 04/14/2014 Elsevier Interactive Patient Education  2017 [employer].

## 2019-09-28 DIAGNOSIS — N2581 Secondary hyperparathyroidism of renal origin: Secondary | ICD-10-CM | POA: Diagnosis not present

## 2019-09-28 DIAGNOSIS — N186 End stage renal disease: Secondary | ICD-10-CM | POA: Diagnosis not present

## 2019-09-28 DIAGNOSIS — D509 Iron deficiency anemia, unspecified: Secondary | ICD-10-CM | POA: Diagnosis not present

## 2019-09-28 DIAGNOSIS — Z992 Dependence on renal dialysis: Secondary | ICD-10-CM | POA: Diagnosis not present

## 2019-09-30 DIAGNOSIS — N186 End stage renal disease: Secondary | ICD-10-CM | POA: Diagnosis not present

## 2019-09-30 DIAGNOSIS — N2581 Secondary hyperparathyroidism of renal origin: Secondary | ICD-10-CM | POA: Diagnosis not present

## 2019-09-30 DIAGNOSIS — D509 Iron deficiency anemia, unspecified: Secondary | ICD-10-CM | POA: Diagnosis not present

## 2019-09-30 DIAGNOSIS — Z992 Dependence on renal dialysis: Secondary | ICD-10-CM | POA: Diagnosis not present

## 2019-10-03 DIAGNOSIS — Z992 Dependence on renal dialysis: Secondary | ICD-10-CM | POA: Diagnosis not present

## 2019-10-03 DIAGNOSIS — D509 Iron deficiency anemia, unspecified: Secondary | ICD-10-CM | POA: Diagnosis not present

## 2019-10-03 DIAGNOSIS — N2581 Secondary hyperparathyroidism of renal origin: Secondary | ICD-10-CM | POA: Diagnosis not present

## 2019-10-03 DIAGNOSIS — N186 End stage renal disease: Secondary | ICD-10-CM | POA: Diagnosis not present

## 2019-10-05 ENCOUNTER — Telehealth: Payer: Self-pay | Admitting: Family Medicine

## 2019-10-05 DIAGNOSIS — N186 End stage renal disease: Secondary | ICD-10-CM | POA: Diagnosis not present

## 2019-10-05 DIAGNOSIS — N2581 Secondary hyperparathyroidism of renal origin: Secondary | ICD-10-CM | POA: Diagnosis not present

## 2019-10-05 DIAGNOSIS — D509 Iron deficiency anemia, unspecified: Secondary | ICD-10-CM | POA: Diagnosis not present

## 2019-10-05 DIAGNOSIS — Z992 Dependence on renal dialysis: Secondary | ICD-10-CM | POA: Diagnosis not present

## 2019-10-05 DIAGNOSIS — M79673 Pain in unspecified foot: Secondary | ICD-10-CM

## 2019-10-05 MED ORDER — QUETIAPINE FUMARATE 100 MG PO TABS: 200.0000 mg | ORAL_TABLET | Freq: Two times a day (BID) | ORAL | 1 refills | Status: DC

## 2019-10-05 NOTE — Addendum Note (Signed)
Addended by: Davis Gourd on: 10/05/2019 02:44 PM   Modules accepted: Orders

## 2019-10-05 NOTE — Telephone Encounter (Signed)
Pt called in asking for a refill on the Quetiapine, she states that she has been taking 2 tabs at night. Please advise pt can be reached at the home # and uses [facility] [address].

## 2019-10-05 NOTE — Telephone Encounter (Signed)
Pt can pick up medication from pharmacy as it is available for her

## 2019-10-05 NOTE — Telephone Encounter (Signed)
Pt informed. Also asked if she could have a referral to a new foot doctor. She is having such swelling and pain and [facility] only gives her injections.

## 2019-10-05 NOTE — Telephone Encounter (Signed)
Referral placed.

## 2019-10-05 NOTE — Telephone Encounter (Signed)
And it is ok for her to take 200mg  nightly- we can change prescription to reflect this increased dose- 2 tabs QHS, #180, 1 refill

## 2019-10-05 NOTE — Telephone Encounter (Signed)
Ok to refer to [facility].  Dx- foot pain

## 2019-10-05 NOTE — Telephone Encounter (Signed)
Please advise if this is ok?  Last OV 09/22/19 Seroquel last filled 05/30/19 #90 with 1  Called and spoke with pharmacy this was filled yesterday, pt has not picked it up.

## 2019-10-07 DIAGNOSIS — D509 Iron deficiency anemia, unspecified: Secondary | ICD-10-CM | POA: Diagnosis not present

## 2019-10-07 DIAGNOSIS — Z992 Dependence on renal dialysis: Secondary | ICD-10-CM | POA: Diagnosis not present

## 2019-10-07 DIAGNOSIS — N2581 Secondary hyperparathyroidism of renal origin: Secondary | ICD-10-CM | POA: Diagnosis not present

## 2019-10-07 DIAGNOSIS — N186 End stage renal disease: Secondary | ICD-10-CM | POA: Diagnosis not present

## 2019-10-10 DIAGNOSIS — E1129 Type 2 diabetes mellitus with other diabetic kidney complication: Secondary | ICD-10-CM | POA: Diagnosis not present

## 2019-10-10 DIAGNOSIS — D689 Coagulation defect, unspecified: Secondary | ICD-10-CM | POA: Diagnosis not present

## 2019-10-10 DIAGNOSIS — N186 End stage renal disease: Secondary | ICD-10-CM | POA: Diagnosis not present

## 2019-10-10 DIAGNOSIS — L299 Pruritus, unspecified: Secondary | ICD-10-CM | POA: Diagnosis not present

## 2019-10-10 DIAGNOSIS — Z992 Dependence on renal dialysis: Secondary | ICD-10-CM | POA: Diagnosis not present

## 2019-10-10 DIAGNOSIS — D631 Anemia in chronic kidney disease: Secondary | ICD-10-CM | POA: Diagnosis not present

## 2019-10-10 DIAGNOSIS — N2581 Secondary hyperparathyroidism of renal origin: Secondary | ICD-10-CM | POA: Diagnosis not present

## 2019-10-11 ENCOUNTER — Other Ambulatory Visit: Payer: Medicare HMO

## 2019-10-12 DIAGNOSIS — L299 Pruritus, unspecified: Secondary | ICD-10-CM | POA: Diagnosis not present

## 2019-10-12 DIAGNOSIS — D631 Anemia in chronic kidney disease: Secondary | ICD-10-CM | POA: Diagnosis not present

## 2019-10-12 DIAGNOSIS — Z992 Dependence on renal dialysis: Secondary | ICD-10-CM | POA: Diagnosis not present

## 2019-10-12 DIAGNOSIS — D689 Coagulation defect, unspecified: Secondary | ICD-10-CM | POA: Diagnosis not present

## 2019-10-12 DIAGNOSIS — E1129 Type 2 diabetes mellitus with other diabetic kidney complication: Secondary | ICD-10-CM | POA: Diagnosis not present

## 2019-10-12 DIAGNOSIS — N2581 Secondary hyperparathyroidism of renal origin: Secondary | ICD-10-CM | POA: Diagnosis not present

## 2019-10-12 DIAGNOSIS — N186 End stage renal disease: Secondary | ICD-10-CM | POA: Diagnosis not present

## 2019-10-13 ENCOUNTER — Other Ambulatory Visit: Payer: Self-pay

## 2019-10-13 ENCOUNTER — Ambulatory Visit (INDEPENDENT_AMBULATORY_CARE_PROVIDER_SITE_OTHER): Payer: Medicare HMO | Admitting: Family Medicine

## 2019-10-13 ENCOUNTER — Encounter: Payer: Self-pay | Admitting: Family Medicine

## 2019-10-13 VITALS — BP 130/81 | HR 80 | Temp 98.2°F | Resp 16 | Ht 66.0 in | Wt 175.1 lb

## 2019-10-13 DIAGNOSIS — E1122 Type 2 diabetes mellitus with diabetic chronic kidney disease: Secondary | ICD-10-CM

## 2019-10-13 DIAGNOSIS — I10 Essential (primary) hypertension: Secondary | ICD-10-CM | POA: Diagnosis not present

## 2019-10-13 DIAGNOSIS — Z992 Dependence on renal dialysis: Secondary | ICD-10-CM | POA: Diagnosis not present

## 2019-10-13 DIAGNOSIS — N186 End stage renal disease: Secondary | ICD-10-CM

## 2019-10-13 LAB — BASIC METABOLIC PANEL
BUN: 24 mg/dL — ABNORMAL HIGH (ref 6–23)
CO2: 34 mEq/L — ABNORMAL HIGH (ref 19–32)
Calcium: 9.3 mg/dL (ref 8.4–10.5)
Chloride: 96 mEq/L (ref 96–112)
Creatinine, Ser: 7 mg/dL (ref 0.40–1.20)
GFR: 7.21 mL/min — CL (ref >60.00)
Glucose, Bld: 97 mg/dL (ref 70–99)
Potassium: 5 mEq/L (ref 3.5–5.1)
Sodium: 139 mEq/L (ref 135–145)

## 2019-10-13 LAB — HEMOGLOBIN A1C: Hgb A1c MFr Bld: 5.7 % (ref 4.6–6.5)

## 2019-10-13 NOTE — Patient Instructions (Signed)
Follow up in 6 months to recheck BP, cholesterol, and sugar We'll notify you of your lab results and make any changes if needed Continue to work on healthy diet and regular exercise- you look great! Call with any questions or concerns Have a great summer!

## 2019-10-13 NOTE — Assessment & Plan Note (Signed)
Chronic problem.  Has been doing well w/ diet control.  Currently asymptomatic.  UTD on eye exam, foot exam.  Pt reports overall doing well.  Check labs.

## 2019-10-13 NOTE — Assessment & Plan Note (Signed)
Chronic problem.  Well controlled today.  Asymptomatic.  Check labs.  No anticipated med changes.  Will follow. 

## 2019-10-13 NOTE — Progress Notes (Signed)
° °  Subjective:    Patient ID: [name] [name], female    DOB: 06-May-1955, 64 y.o.   MRN: [id number]  HPI DM- chronic problem, currently diet controlled.  UTD on eye exam, foot exam.  No need for microalbumin due to HD.  Last A1C 5.5  Has intermittent numbness of hands/feet.  HTN- chronic problem, on Amlodipine 10mg , Hydralazine 50mg  BID w/ good control.  No CP, has some SOB w/ strenuous activity.  No HAs, visual changes, edema.   Review of Systems For ROS see HPI   This visit occurred during the SARS-CoV-2 public health emergency.  Safety protocols were in place, including screening questions prior to the visit, additional usage of staff PPE, and extensive cleaning of exam room while observing appropriate contact time as indicated for disinfecting solutions.       Objective:   Physical Exam Vitals reviewed.  Constitutional:      General: She is not in acute distress.    Appearance: Normal appearance. She is well-developed.  HENT:     Head: Normocephalic and atraumatic.  Eyes:     Conjunctiva/sclera: Conjunctivae normal.     Pupils: Pupils are equal, round, and reactive to light.  Neck:     Thyroid: No thyromegaly.  Cardiovascular:     Rate and Rhythm: Normal rate and regular rhythm.     Heart sounds: Normal heart sounds. No murmur heard.   Pulmonary:     Effort: Pulmonary effort is normal. No respiratory distress.     Breath sounds: Normal breath sounds.  Abdominal:     General: There is no distension.     Palpations: Abdomen is soft.     Tenderness: There is no abdominal tenderness.  Musculoskeletal:     Cervical back: Normal range of motion and neck supple.  Lymphadenopathy:     Cervical: No cervical adenopathy.  Skin:    General: Skin is warm and dry.  Neurological:     Mental Status: She is alert and oriented to person, place, and time.  Psychiatric:        Behavior: Behavior normal.           Assessment & Plan:

## 2019-10-14 ENCOUNTER — Encounter: Payer: Self-pay | Admitting: General Practice

## 2019-10-14 DIAGNOSIS — E1129 Type 2 diabetes mellitus with other diabetic kidney complication: Secondary | ICD-10-CM | POA: Diagnosis not present

## 2019-10-14 DIAGNOSIS — N186 End stage renal disease: Secondary | ICD-10-CM | POA: Diagnosis not present

## 2019-10-14 DIAGNOSIS — L299 Pruritus, unspecified: Secondary | ICD-10-CM | POA: Diagnosis not present

## 2019-10-14 DIAGNOSIS — Z992 Dependence on renal dialysis: Secondary | ICD-10-CM | POA: Diagnosis not present

## 2019-10-14 DIAGNOSIS — D689 Coagulation defect, unspecified: Secondary | ICD-10-CM | POA: Diagnosis not present

## 2019-10-14 DIAGNOSIS — D631 Anemia in chronic kidney disease: Secondary | ICD-10-CM | POA: Diagnosis not present

## 2019-10-14 DIAGNOSIS — N2581 Secondary hyperparathyroidism of renal origin: Secondary | ICD-10-CM | POA: Diagnosis not present

## 2019-10-17 ENCOUNTER — Telehealth: Payer: Self-pay | Admitting: Family Medicine

## 2019-10-17 DIAGNOSIS — D689 Coagulation defect, unspecified: Secondary | ICD-10-CM | POA: Diagnosis not present

## 2019-10-17 DIAGNOSIS — Z992 Dependence on renal dialysis: Secondary | ICD-10-CM | POA: Diagnosis not present

## 2019-10-17 DIAGNOSIS — N2581 Secondary hyperparathyroidism of renal origin: Secondary | ICD-10-CM | POA: Diagnosis not present

## 2019-10-17 DIAGNOSIS — D631 Anemia in chronic kidney disease: Secondary | ICD-10-CM | POA: Diagnosis not present

## 2019-10-17 DIAGNOSIS — E1129 Type 2 diabetes mellitus with other diabetic kidney complication: Secondary | ICD-10-CM | POA: Diagnosis not present

## 2019-10-17 DIAGNOSIS — N186 End stage renal disease: Secondary | ICD-10-CM | POA: Diagnosis not present

## 2019-10-17 DIAGNOSIS — L299 Pruritus, unspecified: Secondary | ICD-10-CM | POA: Diagnosis not present

## 2019-10-17 NOTE — Telephone Encounter (Signed)
Please advise 

## 2019-10-17 NOTE — Telephone Encounter (Signed)
Patient called stating she has seen Dr. Birdie Riddle for arthritis in her feet.  Patient would like to know if Dr. Birdie Riddle would be able to refer her to a specialist for this issue?

## 2019-10-18 NOTE — Telephone Encounter (Signed)
The referral was sent to [facility] on 07/14, F/up on 07/19 they did receive the referral. I plan on f/up on my referrals today.

## 2019-10-18 NOTE — Telephone Encounter (Signed)
This referral was placed on 10/05/19

## 2019-10-18 NOTE — Telephone Encounter (Signed)
[place] for referral to podiatry- dx bilateral foot pain

## 2019-10-19 DIAGNOSIS — N186 End stage renal disease: Secondary | ICD-10-CM | POA: Diagnosis not present

## 2019-10-19 DIAGNOSIS — D631 Anemia in chronic kidney disease: Secondary | ICD-10-CM | POA: Diagnosis not present

## 2019-10-19 DIAGNOSIS — L299 Pruritus, unspecified: Secondary | ICD-10-CM | POA: Diagnosis not present

## 2019-10-19 DIAGNOSIS — E1129 Type 2 diabetes mellitus with other diabetic kidney complication: Secondary | ICD-10-CM | POA: Diagnosis not present

## 2019-10-19 DIAGNOSIS — N2581 Secondary hyperparathyroidism of renal origin: Secondary | ICD-10-CM | POA: Diagnosis not present

## 2019-10-19 DIAGNOSIS — D689 Coagulation defect, unspecified: Secondary | ICD-10-CM | POA: Diagnosis not present

## 2019-10-19 DIAGNOSIS — Z992 Dependence on renal dialysis: Secondary | ICD-10-CM | POA: Diagnosis not present

## 2019-10-21 ENCOUNTER — Other Ambulatory Visit: Payer: Self-pay | Admitting: Family Medicine

## 2019-10-21 DIAGNOSIS — D631 Anemia in chronic kidney disease: Secondary | ICD-10-CM | POA: Diagnosis not present

## 2019-10-21 DIAGNOSIS — E1129 Type 2 diabetes mellitus with other diabetic kidney complication: Secondary | ICD-10-CM | POA: Diagnosis not present

## 2019-10-21 DIAGNOSIS — L299 Pruritus, unspecified: Secondary | ICD-10-CM | POA: Diagnosis not present

## 2019-10-21 DIAGNOSIS — N186 End stage renal disease: Secondary | ICD-10-CM | POA: Diagnosis not present

## 2019-10-21 DIAGNOSIS — Z992 Dependence on renal dialysis: Secondary | ICD-10-CM | POA: Diagnosis not present

## 2019-10-21 DIAGNOSIS — D689 Coagulation defect, unspecified: Secondary | ICD-10-CM | POA: Diagnosis not present

## 2019-10-21 DIAGNOSIS — N2581 Secondary hyperparathyroidism of renal origin: Secondary | ICD-10-CM | POA: Diagnosis not present

## 2019-10-21 MED ORDER — HYDROCODONE-ACETAMINOPHEN 5-325 MG PO TABS: 1.0000 | ORAL_TABLET | Freq: Four times a day (QID) | ORAL | 0 refills | Status: DC | PRN

## 2019-10-21 NOTE — Telephone Encounter (Signed)
Last OV 10/13/19 Hydrocodone last filled 09/22/19 #30 with 0

## 2019-10-21 NOTE — Telephone Encounter (Signed)
Pt called in asking for a refill on the hydrocodone, [facility] [address]  Last refill was 09/22/19   Please advise

## 2019-10-22 DIAGNOSIS — N186 End stage renal disease: Secondary | ICD-10-CM | POA: Diagnosis not present

## 2019-10-22 DIAGNOSIS — I150 Renovascular hypertension: Secondary | ICD-10-CM | POA: Diagnosis not present

## 2019-10-22 DIAGNOSIS — Z992 Dependence on renal dialysis: Secondary | ICD-10-CM | POA: Diagnosis not present

## 2019-10-24 DIAGNOSIS — Z992 Dependence on renal dialysis: Secondary | ICD-10-CM | POA: Diagnosis not present

## 2019-10-24 DIAGNOSIS — N186 End stage renal disease: Secondary | ICD-10-CM | POA: Diagnosis not present

## 2019-10-24 DIAGNOSIS — D631 Anemia in chronic kidney disease: Secondary | ICD-10-CM | POA: Diagnosis not present

## 2019-10-24 DIAGNOSIS — N2581 Secondary hyperparathyroidism of renal origin: Secondary | ICD-10-CM | POA: Diagnosis not present

## 2019-10-24 DIAGNOSIS — D689 Coagulation defect, unspecified: Secondary | ICD-10-CM | POA: Diagnosis not present

## 2019-10-24 DIAGNOSIS — L299 Pruritus, unspecified: Secondary | ICD-10-CM | POA: Diagnosis not present

## 2019-10-24 DIAGNOSIS — E1129 Type 2 diabetes mellitus with other diabetic kidney complication: Secondary | ICD-10-CM | POA: Diagnosis not present

## 2019-10-26 DIAGNOSIS — E1129 Type 2 diabetes mellitus with other diabetic kidney complication: Secondary | ICD-10-CM | POA: Diagnosis not present

## 2019-10-26 DIAGNOSIS — N2581 Secondary hyperparathyroidism of renal origin: Secondary | ICD-10-CM | POA: Diagnosis not present

## 2019-10-26 DIAGNOSIS — D631 Anemia in chronic kidney disease: Secondary | ICD-10-CM | POA: Diagnosis not present

## 2019-10-26 DIAGNOSIS — D689 Coagulation defect, unspecified: Secondary | ICD-10-CM | POA: Diagnosis not present

## 2019-10-26 DIAGNOSIS — L299 Pruritus, unspecified: Secondary | ICD-10-CM | POA: Diagnosis not present

## 2019-10-26 DIAGNOSIS — N186 End stage renal disease: Secondary | ICD-10-CM | POA: Diagnosis not present

## 2019-10-26 DIAGNOSIS — Z992 Dependence on renal dialysis: Secondary | ICD-10-CM | POA: Diagnosis not present

## 2019-10-27 ENCOUNTER — Other Ambulatory Visit: Payer: Self-pay

## 2019-10-27 ENCOUNTER — Encounter: Payer: Self-pay | Admitting: Podiatry

## 2019-10-27 ENCOUNTER — Telehealth: Payer: Self-pay | Admitting: Family Medicine

## 2019-10-27 ENCOUNTER — Ambulatory Visit (INDEPENDENT_AMBULATORY_CARE_PROVIDER_SITE_OTHER): Payer: Medicare HMO | Admitting: Podiatry

## 2019-10-27 VITALS — Temp 97.5°F

## 2019-10-27 DIAGNOSIS — M79672 Pain in left foot: Secondary | ICD-10-CM

## 2019-10-27 DIAGNOSIS — M779 Enthesopathy, unspecified: Secondary | ICD-10-CM | POA: Diagnosis not present

## 2019-10-27 DIAGNOSIS — M79671 Pain in right foot: Secondary | ICD-10-CM | POA: Diagnosis not present

## 2019-10-27 DIAGNOSIS — G629 Polyneuropathy, unspecified: Secondary | ICD-10-CM | POA: Diagnosis not present

## 2019-10-27 NOTE — Patient Instructions (Signed)
Diabetes Mellitus and Foot Care Foot care is an important part of your health, especially when you have diabetes. Diabetes may cause you to have problems because of poor blood flow (circulation) to your feet and legs, which can cause your skin to:  Become thinner and drier.  Break more easily.  Heal more slowly.  Peel and crack. You may also have nerve damage (neuropathy) in your legs and feet, causing decreased feeling in them. This means that you may not notice minor injuries to your feet that could lead to more serious problems. Noticing and addressing any potential problems early is the best way to prevent future foot problems. How to care for your feet Foot hygiene  Wash your feet daily with warm water and mild soap. Do not use hot water. Then, pat your feet and the areas between your toes until they are completely dry. Do not soak your feet as this can dry your skin.  Trim your toenails straight across. Do not dig under them or around the cuticle. File the edges of your nails with an emery board or nail file.  Apply a moisturizing lotion or petroleum jelly to the skin on your feet and to dry, brittle toenails. Use lotion that does not contain alcohol and is unscented. Do not apply lotion between your toes. Shoes and socks  Wear clean socks or stockings every day. Make sure they are not too tight. Do not wear knee-high stockings since they may decrease blood flow to your legs.  Wear shoes that fit properly and have enough cushioning. Always look in your shoes before you put them on to be sure there are no objects inside.  To break in new shoes, wear them for just a few hours a day. This prevents injuries on your feet. Wounds, scrapes, corns, and calluses  Check your feet daily for blisters, cuts, bruises, sores, and redness. If you cannot see the bottom of your feet, use a mirror or ask someone for help.  Do not cut corns or calluses or try to remove them with medicine.  If you  find a minor scrape, cut, or break in the skin on your feet, keep it and the skin around it clean and dry. You may clean these areas with mild soap and water. Do not clean the area with peroxide, alcohol, or iodine.  If you have a wound, scrape, corn, or callus on your foot, look at it several times a day to make sure it is healing and not infected. Check for: ? Redness, swelling, or pain. ? Fluid or blood. ? Warmth. ? Pus or a bad smell. General instructions  Do not cross your legs. This may decrease blood flow to your feet.  Do not use heating pads or hot water bottles on your feet. They may burn your skin. If you have lost feeling in your feet or legs, you may not know this is happening until it is too late.  Protect your feet from hot and cold by wearing shoes, such as at the beach or on hot pavement.  Schedule a complete foot exam at least once a year (annually) or more often if you have foot problems. If you have foot problems, report any cuts, sores, or bruises to your health care provider immediately. Contact a health care provider if:  You have a medical condition that increases your risk of infection and you have any cuts, sores, or bruises on your feet.  You have an injury that is not   healing.  You have redness on your legs or feet.  You feel burning or tingling in your legs or feet.  You have pain or cramps in your legs and feet.  Your legs or feet are numb.  Your feet always feel cold.  You have pain around a toenail. Get help right away if:  You have a wound, scrape, corn, or callus on your foot and: ? You have pain, swelling, or redness that gets worse. ? You have fluid or blood coming from the wound, scrape, corn, or callus. ? Your wound, scrape, corn, or callus feels warm to the touch. ? You have pus or a bad smell coming from the wound, scrape, corn, or callus. ? You have a fever. ? You have a red line going up your leg. Summary  Check your feet every day  for cuts, sores, red spots, swelling, and blisters.  Moisturize feet and legs daily.  Wear shoes that fit properly and have enough cushioning.  If you have foot problems, report any cuts, sores, or bruises to your health care provider immediately.  Schedule a complete foot exam at least once a year (annually) or more often if you have foot problems. This information is not intended to replace advice given to you by your health care provider. Make sure you discuss any questions you have with your health care provider. Document Revised: 12/01/2018 Document Reviewed: 04/11/2016 Elsevier Patient Education  2020 Elsevier Inc.  

## 2019-10-27 NOTE — Telephone Encounter (Signed)
Patient called stating that she went to the [facility] and that he recommends that she be placed on Cymbalta for her foot neuropathy.  Please advise

## 2019-10-27 NOTE — Telephone Encounter (Signed)
Please advise 

## 2019-10-27 NOTE — Progress Notes (Signed)
Subjective:   Patient ID: [name] [name], female   DOB: 64 y.o.   MRN: [id number]   HPI Patient presents stating that she still having pain in her forefoot the medicine does not seem to help and she is taking gabapentin in the past which does not seem to also be helping her and she is wondering what else would be possible   ROS      Objective:  Physical Exam  Neurovascular status unchanged from previous visit with patient having discomfort forefoot bilateral nondescript with no specific area of discomfort but pain across the metatarsal phalangeal joints. Patient is noted to have no change in muscle strength and range of motion     Assessment:  Very difficult to separate neuropathic pain from possible capsulitis inflammatory pain in this case with patient not responding to steroidal medication     Plan:  Reviewed condition at great length. I do think we can try Cymbalta and I am sending that over to her family physician to consider and I reviewed topical medicine shoe gear modifications. Do not see anything else we can do currently and she will be seen back as needed and hopefully Cymbalta will be of benefit to her

## 2019-10-28 DIAGNOSIS — D689 Coagulation defect, unspecified: Secondary | ICD-10-CM | POA: Diagnosis not present

## 2019-10-28 DIAGNOSIS — E1129 Type 2 diabetes mellitus with other diabetic kidney complication: Secondary | ICD-10-CM | POA: Diagnosis not present

## 2019-10-28 DIAGNOSIS — L299 Pruritus, unspecified: Secondary | ICD-10-CM | POA: Diagnosis not present

## 2019-10-28 DIAGNOSIS — D631 Anemia in chronic kidney disease: Secondary | ICD-10-CM | POA: Diagnosis not present

## 2019-10-28 DIAGNOSIS — N2581 Secondary hyperparathyroidism of renal origin: Secondary | ICD-10-CM | POA: Diagnosis not present

## 2019-10-28 DIAGNOSIS — Z992 Dependence on renal dialysis: Secondary | ICD-10-CM | POA: Diagnosis not present

## 2019-10-28 DIAGNOSIS — N186 End stage renal disease: Secondary | ICD-10-CM | POA: Diagnosis not present

## 2019-10-28 MED ORDER — DULOXETINE HCL 20 MG PO CPEP
20.0000 mg | ORAL_CAPSULE | Freq: Every day | ORAL | 3 refills | Status: DC
Start: 2019-10-28 — End: 2019-11-21

## 2019-10-28 NOTE — Telephone Encounter (Signed)
Ok to start Cymbalta 20mg  daily #30, 3 refills and pt will need a follow up in 4-6 weeks to see if pain is improving

## 2019-10-28 NOTE — Telephone Encounter (Signed)
Pt informed. Medication filled to pharmacy as requested.  And office visit scheduled.

## 2019-10-29 ENCOUNTER — Other Ambulatory Visit: Payer: Self-pay | Admitting: Family Medicine

## 2019-10-31 ENCOUNTER — Other Ambulatory Visit: Payer: Self-pay | Admitting: Family Medicine

## 2019-10-31 DIAGNOSIS — Z992 Dependence on renal dialysis: Secondary | ICD-10-CM | POA: Diagnosis not present

## 2019-10-31 DIAGNOSIS — D689 Coagulation defect, unspecified: Secondary | ICD-10-CM | POA: Diagnosis not present

## 2019-10-31 DIAGNOSIS — N2581 Secondary hyperparathyroidism of renal origin: Secondary | ICD-10-CM | POA: Diagnosis not present

## 2019-10-31 DIAGNOSIS — N186 End stage renal disease: Secondary | ICD-10-CM | POA: Diagnosis not present

## 2019-10-31 DIAGNOSIS — D631 Anemia in chronic kidney disease: Secondary | ICD-10-CM | POA: Diagnosis not present

## 2019-10-31 DIAGNOSIS — E1129 Type 2 diabetes mellitus with other diabetic kidney complication: Secondary | ICD-10-CM | POA: Diagnosis not present

## 2019-10-31 DIAGNOSIS — L299 Pruritus, unspecified: Secondary | ICD-10-CM | POA: Diagnosis not present

## 2019-11-02 DIAGNOSIS — L299 Pruritus, unspecified: Secondary | ICD-10-CM | POA: Diagnosis not present

## 2019-11-02 DIAGNOSIS — D631 Anemia in chronic kidney disease: Secondary | ICD-10-CM | POA: Diagnosis not present

## 2019-11-02 DIAGNOSIS — N2581 Secondary hyperparathyroidism of renal origin: Secondary | ICD-10-CM | POA: Diagnosis not present

## 2019-11-02 DIAGNOSIS — N186 End stage renal disease: Secondary | ICD-10-CM | POA: Diagnosis not present

## 2019-11-02 DIAGNOSIS — Z992 Dependence on renal dialysis: Secondary | ICD-10-CM | POA: Diagnosis not present

## 2019-11-02 DIAGNOSIS — E1129 Type 2 diabetes mellitus with other diabetic kidney complication: Secondary | ICD-10-CM | POA: Diagnosis not present

## 2019-11-02 DIAGNOSIS — D689 Coagulation defect, unspecified: Secondary | ICD-10-CM | POA: Diagnosis not present

## 2019-11-03 ENCOUNTER — Ambulatory Visit (INDEPENDENT_AMBULATORY_CARE_PROVIDER_SITE_OTHER): Payer: Medicare HMO | Admitting: Family Medicine

## 2019-11-03 ENCOUNTER — Other Ambulatory Visit: Payer: Self-pay

## 2019-11-03 ENCOUNTER — Encounter: Payer: Self-pay | Admitting: Family Medicine

## 2019-11-03 ENCOUNTER — Ambulatory Visit: Payer: Self-pay

## 2019-11-03 VITALS — BP 140/84 | Ht 66.0 in | Wt 176.2 lb

## 2019-11-03 DIAGNOSIS — M25552 Pain in left hip: Secondary | ICD-10-CM

## 2019-11-03 NOTE — Progress Notes (Signed)
   I, Wendy Poet, LAT, ATC, am serving as scribe for Dr. Lynne Leader.  [name] [name] is a 64 y.o. female who presents to [place] at [facility] today for f/u of L lateral hip/thigh pain.  She was last seen by Dr. Georgina Snell on 09/22/19 and was referred to PT but did not attend.  Since her last visit, pt reports that overall it's better but comes and goes.  She locates her pain to her L lateral hip but does not have pain radiating into her thigh.  She [place] on [address]. near [place].  She think she could attend physical therapy but she was bit confused with the plan was last visit.  She does not think her pain is bad enough to need an injection at this time.  Diagnostic testing: L hip XR- 09/22/19   Pertinent review of systems: No fevers or chills  Relevant historical information: Hypertension carotid artery disease, diabetes   Exam:  BP 140/84 (BP Location: Right Arm, Patient Position: Sitting, Cuff Size: Normal)   Ht 5\' 6"  (1.676 m)   Wt 176 lb 3.2 oz (79.9 kg)   BMI 28.44 kg/m  General: Well Developed, well nourished, and in no acute distress.   MSK: Left hip normal-appearing tender palpation greater trochanter and buttocks.  Normal gait.     Assessment and Plan: 64 y.o. female with left lateral hip pain.  Due to hip abductor and external rotator tendinopathy.  Due to some confusion patient never did physical therapy or really much home exercise program since her last visit.  She is already improving a little bit.  I think she is very likely to have considerable improvement with dedicated physical therapy.  Plan to repeat refer to a different physical therapy location near to her home.  Recheck in 6 weeks.  Return sooner if needed.    Orders Placed This Encounter  Procedures  . [place] LIMITED JOINT SPACE STRUCTURES LOW LEFT(NO LINKED CHARGES)    Order Specific Question:   Reason for Exam (SYMPTOM  OR DIAGNOSIS REQUIRED)    Answer:   L hip pain    Order  Specific Question:   Preferred imaging location?    Answer:   [place]  . Ambulatory referral to Physical Therapy    Referral Priority:   Routine    Referral Type:   Physical Medicine    Referral Reason:   Specialty Services Required    Requested Specialty:   Physical Therapy   No orders of the defined types were placed in this encounter.    Discussed warning signs or symptoms. Please see discharge instructions. Patient expresses understanding.   The above documentation has been reviewed and is accurate and complete Lynne Leader, M.D.

## 2019-11-03 NOTE — Patient Instructions (Signed)
Thank you for coming in today. Plan for PT.  Recheck in about 6 weeks.  If not better we can do more.

## 2019-11-04 ENCOUNTER — Telehealth: Payer: Self-pay | Admitting: Family Medicine

## 2019-11-04 DIAGNOSIS — D689 Coagulation defect, unspecified: Secondary | ICD-10-CM | POA: Diagnosis not present

## 2019-11-04 DIAGNOSIS — Z992 Dependence on renal dialysis: Secondary | ICD-10-CM | POA: Diagnosis not present

## 2019-11-04 DIAGNOSIS — L299 Pruritus, unspecified: Secondary | ICD-10-CM | POA: Diagnosis not present

## 2019-11-04 DIAGNOSIS — N186 End stage renal disease: Secondary | ICD-10-CM | POA: Diagnosis not present

## 2019-11-04 DIAGNOSIS — D631 Anemia in chronic kidney disease: Secondary | ICD-10-CM | POA: Diagnosis not present

## 2019-11-04 DIAGNOSIS — N2581 Secondary hyperparathyroidism of renal origin: Secondary | ICD-10-CM | POA: Diagnosis not present

## 2019-11-04 DIAGNOSIS — E1129 Type 2 diabetes mellitus with other diabetic kidney complication: Secondary | ICD-10-CM | POA: Diagnosis not present

## 2019-11-04 NOTE — Telephone Encounter (Signed)
Patient found a RHEUMOTOLOGIST  For her feet [name] [name] D. [name] - she will need a referral put in so she can be seen.

## 2019-11-04 NOTE — Telephone Encounter (Signed)
Please advise 

## 2019-11-07 DIAGNOSIS — N186 End stage renal disease: Secondary | ICD-10-CM | POA: Diagnosis not present

## 2019-11-07 DIAGNOSIS — N2581 Secondary hyperparathyroidism of renal origin: Secondary | ICD-10-CM | POA: Diagnosis not present

## 2019-11-07 DIAGNOSIS — E1129 Type 2 diabetes mellitus with other diabetic kidney complication: Secondary | ICD-10-CM | POA: Diagnosis not present

## 2019-11-07 DIAGNOSIS — D631 Anemia in chronic kidney disease: Secondary | ICD-10-CM | POA: Diagnosis not present

## 2019-11-07 DIAGNOSIS — Z992 Dependence on renal dialysis: Secondary | ICD-10-CM | POA: Diagnosis not present

## 2019-11-07 DIAGNOSIS — L299 Pruritus, unspecified: Secondary | ICD-10-CM | POA: Diagnosis not present

## 2019-11-07 DIAGNOSIS — D689 Coagulation defect, unspecified: Secondary | ICD-10-CM | POA: Diagnosis not present

## 2019-11-09 DIAGNOSIS — N186 End stage renal disease: Secondary | ICD-10-CM | POA: Diagnosis not present

## 2019-11-09 DIAGNOSIS — E1129 Type 2 diabetes mellitus with other diabetic kidney complication: Secondary | ICD-10-CM | POA: Diagnosis not present

## 2019-11-09 DIAGNOSIS — L299 Pruritus, unspecified: Secondary | ICD-10-CM | POA: Diagnosis not present

## 2019-11-09 DIAGNOSIS — Z992 Dependence on renal dialysis: Secondary | ICD-10-CM | POA: Diagnosis not present

## 2019-11-09 DIAGNOSIS — D689 Coagulation defect, unspecified: Secondary | ICD-10-CM | POA: Diagnosis not present

## 2019-11-09 DIAGNOSIS — N2581 Secondary hyperparathyroidism of renal origin: Secondary | ICD-10-CM | POA: Diagnosis not present

## 2019-11-09 DIAGNOSIS — D631 Anemia in chronic kidney disease: Secondary | ICD-10-CM | POA: Diagnosis not present

## 2019-11-11 DIAGNOSIS — D631 Anemia in chronic kidney disease: Secondary | ICD-10-CM | POA: Diagnosis not present

## 2019-11-11 DIAGNOSIS — E1129 Type 2 diabetes mellitus with other diabetic kidney complication: Secondary | ICD-10-CM | POA: Diagnosis not present

## 2019-11-11 DIAGNOSIS — N186 End stage renal disease: Secondary | ICD-10-CM | POA: Diagnosis not present

## 2019-11-11 DIAGNOSIS — N2581 Secondary hyperparathyroidism of renal origin: Secondary | ICD-10-CM | POA: Diagnosis not present

## 2019-11-11 DIAGNOSIS — Z992 Dependence on renal dialysis: Secondary | ICD-10-CM | POA: Diagnosis not present

## 2019-11-11 DIAGNOSIS — L299 Pruritus, unspecified: Secondary | ICD-10-CM | POA: Diagnosis not present

## 2019-11-11 DIAGNOSIS — D689 Coagulation defect, unspecified: Secondary | ICD-10-CM | POA: Diagnosis not present

## 2019-11-14 DIAGNOSIS — E1129 Type 2 diabetes mellitus with other diabetic kidney complication: Secondary | ICD-10-CM | POA: Diagnosis not present

## 2019-11-14 DIAGNOSIS — D631 Anemia in chronic kidney disease: Secondary | ICD-10-CM | POA: Diagnosis not present

## 2019-11-14 DIAGNOSIS — Z992 Dependence on renal dialysis: Secondary | ICD-10-CM | POA: Diagnosis not present

## 2019-11-14 DIAGNOSIS — D689 Coagulation defect, unspecified: Secondary | ICD-10-CM | POA: Diagnosis not present

## 2019-11-14 DIAGNOSIS — L299 Pruritus, unspecified: Secondary | ICD-10-CM | POA: Diagnosis not present

## 2019-11-14 DIAGNOSIS — N2581 Secondary hyperparathyroidism of renal origin: Secondary | ICD-10-CM | POA: Diagnosis not present

## 2019-11-14 DIAGNOSIS — N186 End stage renal disease: Secondary | ICD-10-CM | POA: Diagnosis not present

## 2019-11-15 DIAGNOSIS — M2041 Other hammer toe(s) (acquired), right foot: Secondary | ICD-10-CM | POA: Diagnosis not present

## 2019-11-15 DIAGNOSIS — M79672 Pain in left foot: Secondary | ICD-10-CM | POA: Diagnosis not present

## 2019-11-15 DIAGNOSIS — M79671 Pain in right foot: Secondary | ICD-10-CM | POA: Diagnosis not present

## 2019-11-15 DIAGNOSIS — M2042 Other hammer toe(s) (acquired), left foot: Secondary | ICD-10-CM | POA: Diagnosis not present

## 2019-11-15 DIAGNOSIS — M792 Neuralgia and neuritis, unspecified: Secondary | ICD-10-CM | POA: Diagnosis not present

## 2019-11-16 DIAGNOSIS — L299 Pruritus, unspecified: Secondary | ICD-10-CM | POA: Diagnosis not present

## 2019-11-16 DIAGNOSIS — Z992 Dependence on renal dialysis: Secondary | ICD-10-CM | POA: Diagnosis not present

## 2019-11-16 DIAGNOSIS — D631 Anemia in chronic kidney disease: Secondary | ICD-10-CM | POA: Diagnosis not present

## 2019-11-16 DIAGNOSIS — D689 Coagulation defect, unspecified: Secondary | ICD-10-CM | POA: Diagnosis not present

## 2019-11-16 DIAGNOSIS — N2581 Secondary hyperparathyroidism of renal origin: Secondary | ICD-10-CM | POA: Diagnosis not present

## 2019-11-16 DIAGNOSIS — E1129 Type 2 diabetes mellitus with other diabetic kidney complication: Secondary | ICD-10-CM | POA: Diagnosis not present

## 2019-11-16 DIAGNOSIS — N186 End stage renal disease: Secondary | ICD-10-CM | POA: Diagnosis not present

## 2019-11-18 ENCOUNTER — Other Ambulatory Visit: Payer: Self-pay | Admitting: Family Medicine

## 2019-11-18 DIAGNOSIS — E1129 Type 2 diabetes mellitus with other diabetic kidney complication: Secondary | ICD-10-CM | POA: Diagnosis not present

## 2019-11-18 DIAGNOSIS — N2581 Secondary hyperparathyroidism of renal origin: Secondary | ICD-10-CM | POA: Diagnosis not present

## 2019-11-18 DIAGNOSIS — D689 Coagulation defect, unspecified: Secondary | ICD-10-CM | POA: Diagnosis not present

## 2019-11-18 DIAGNOSIS — N186 End stage renal disease: Secondary | ICD-10-CM | POA: Diagnosis not present

## 2019-11-18 DIAGNOSIS — Z992 Dependence on renal dialysis: Secondary | ICD-10-CM | POA: Diagnosis not present

## 2019-11-18 DIAGNOSIS — L299 Pruritus, unspecified: Secondary | ICD-10-CM | POA: Diagnosis not present

## 2019-11-18 DIAGNOSIS — D631 Anemia in chronic kidney disease: Secondary | ICD-10-CM | POA: Diagnosis not present

## 2019-11-19 ENCOUNTER — Other Ambulatory Visit: Payer: Self-pay | Admitting: Family Medicine

## 2019-11-21 DIAGNOSIS — L299 Pruritus, unspecified: Secondary | ICD-10-CM | POA: Diagnosis not present

## 2019-11-21 DIAGNOSIS — N2581 Secondary hyperparathyroidism of renal origin: Secondary | ICD-10-CM | POA: Diagnosis not present

## 2019-11-21 DIAGNOSIS — E1129 Type 2 diabetes mellitus with other diabetic kidney complication: Secondary | ICD-10-CM | POA: Diagnosis not present

## 2019-11-21 DIAGNOSIS — N186 End stage renal disease: Secondary | ICD-10-CM | POA: Diagnosis not present

## 2019-11-21 DIAGNOSIS — D689 Coagulation defect, unspecified: Secondary | ICD-10-CM | POA: Diagnosis not present

## 2019-11-21 DIAGNOSIS — Z992 Dependence on renal dialysis: Secondary | ICD-10-CM | POA: Diagnosis not present

## 2019-11-21 DIAGNOSIS — D631 Anemia in chronic kidney disease: Secondary | ICD-10-CM | POA: Diagnosis not present

## 2019-11-22 DIAGNOSIS — N186 End stage renal disease: Secondary | ICD-10-CM | POA: Diagnosis not present

## 2019-11-22 DIAGNOSIS — I150 Renovascular hypertension: Secondary | ICD-10-CM | POA: Diagnosis not present

## 2019-11-22 DIAGNOSIS — Z992 Dependence on renal dialysis: Secondary | ICD-10-CM | POA: Diagnosis not present

## 2019-11-23 DIAGNOSIS — D509 Iron deficiency anemia, unspecified: Secondary | ICD-10-CM | POA: Diagnosis not present

## 2019-11-23 DIAGNOSIS — D689 Coagulation defect, unspecified: Secondary | ICD-10-CM | POA: Diagnosis not present

## 2019-11-23 DIAGNOSIS — N186 End stage renal disease: Secondary | ICD-10-CM | POA: Diagnosis not present

## 2019-11-23 DIAGNOSIS — N2581 Secondary hyperparathyroidism of renal origin: Secondary | ICD-10-CM | POA: Diagnosis not present

## 2019-11-23 DIAGNOSIS — Z992 Dependence on renal dialysis: Secondary | ICD-10-CM | POA: Diagnosis not present

## 2019-11-23 DIAGNOSIS — L299 Pruritus, unspecified: Secondary | ICD-10-CM | POA: Diagnosis not present

## 2019-11-23 DIAGNOSIS — E1129 Type 2 diabetes mellitus with other diabetic kidney complication: Secondary | ICD-10-CM | POA: Diagnosis not present

## 2019-11-23 DIAGNOSIS — D631 Anemia in chronic kidney disease: Secondary | ICD-10-CM | POA: Diagnosis not present

## 2019-11-25 DIAGNOSIS — Z992 Dependence on renal dialysis: Secondary | ICD-10-CM | POA: Diagnosis not present

## 2019-11-25 DIAGNOSIS — E1129 Type 2 diabetes mellitus with other diabetic kidney complication: Secondary | ICD-10-CM | POA: Diagnosis not present

## 2019-11-25 DIAGNOSIS — D631 Anemia in chronic kidney disease: Secondary | ICD-10-CM | POA: Diagnosis not present

## 2019-11-25 DIAGNOSIS — D689 Coagulation defect, unspecified: Secondary | ICD-10-CM | POA: Diagnosis not present

## 2019-11-25 DIAGNOSIS — D509 Iron deficiency anemia, unspecified: Secondary | ICD-10-CM | POA: Diagnosis not present

## 2019-11-25 DIAGNOSIS — N2581 Secondary hyperparathyroidism of renal origin: Secondary | ICD-10-CM | POA: Diagnosis not present

## 2019-11-25 DIAGNOSIS — N186 End stage renal disease: Secondary | ICD-10-CM | POA: Diagnosis not present

## 2019-11-25 DIAGNOSIS — L299 Pruritus, unspecified: Secondary | ICD-10-CM | POA: Diagnosis not present

## 2019-11-28 DIAGNOSIS — N186 End stage renal disease: Secondary | ICD-10-CM | POA: Diagnosis not present

## 2019-11-28 DIAGNOSIS — Z992 Dependence on renal dialysis: Secondary | ICD-10-CM | POA: Diagnosis not present

## 2019-11-28 DIAGNOSIS — E1129 Type 2 diabetes mellitus with other diabetic kidney complication: Secondary | ICD-10-CM | POA: Diagnosis not present

## 2019-11-28 DIAGNOSIS — D689 Coagulation defect, unspecified: Secondary | ICD-10-CM | POA: Diagnosis not present

## 2019-11-28 DIAGNOSIS — L299 Pruritus, unspecified: Secondary | ICD-10-CM | POA: Diagnosis not present

## 2019-11-28 DIAGNOSIS — D631 Anemia in chronic kidney disease: Secondary | ICD-10-CM | POA: Diagnosis not present

## 2019-11-28 DIAGNOSIS — N2581 Secondary hyperparathyroidism of renal origin: Secondary | ICD-10-CM | POA: Diagnosis not present

## 2019-11-28 DIAGNOSIS — D509 Iron deficiency anemia, unspecified: Secondary | ICD-10-CM | POA: Diagnosis not present

## 2019-11-29 ENCOUNTER — Other Ambulatory Visit: Payer: Self-pay | Admitting: Family Medicine

## 2019-11-30 DIAGNOSIS — L299 Pruritus, unspecified: Secondary | ICD-10-CM | POA: Diagnosis not present

## 2019-11-30 DIAGNOSIS — E1129 Type 2 diabetes mellitus with other diabetic kidney complication: Secondary | ICD-10-CM | POA: Diagnosis not present

## 2019-11-30 DIAGNOSIS — Z992 Dependence on renal dialysis: Secondary | ICD-10-CM | POA: Diagnosis not present

## 2019-11-30 DIAGNOSIS — N2581 Secondary hyperparathyroidism of renal origin: Secondary | ICD-10-CM | POA: Diagnosis not present

## 2019-11-30 DIAGNOSIS — N186 End stage renal disease: Secondary | ICD-10-CM | POA: Diagnosis not present

## 2019-11-30 DIAGNOSIS — D509 Iron deficiency anemia, unspecified: Secondary | ICD-10-CM | POA: Diagnosis not present

## 2019-11-30 DIAGNOSIS — D689 Coagulation defect, unspecified: Secondary | ICD-10-CM | POA: Diagnosis not present

## 2019-11-30 DIAGNOSIS — D631 Anemia in chronic kidney disease: Secondary | ICD-10-CM | POA: Diagnosis not present

## 2019-12-01 ENCOUNTER — Other Ambulatory Visit: Payer: Self-pay

## 2019-12-01 ENCOUNTER — Encounter: Payer: Self-pay | Admitting: Family Medicine

## 2019-12-01 ENCOUNTER — Ambulatory Visit (INDEPENDENT_AMBULATORY_CARE_PROVIDER_SITE_OTHER): Payer: Medicare HMO | Admitting: Family Medicine

## 2019-12-01 VITALS — BP 148/92 | HR 84 | Temp 97.2°F | Resp 17 | Ht 66.0 in | Wt 171.5 lb

## 2019-12-01 DIAGNOSIS — I10 Essential (primary) hypertension: Secondary | ICD-10-CM

## 2019-12-01 DIAGNOSIS — Z23 Encounter for immunization: Secondary | ICD-10-CM | POA: Diagnosis not present

## 2019-12-01 DIAGNOSIS — M79673 Pain in unspecified foot: Secondary | ICD-10-CM

## 2019-12-01 MED ORDER — HYDRALAZINE HCL 100 MG PO TABS
100.0000 mg | ORAL_TABLET | Freq: Two times a day (BID) | ORAL | 3 refills | Status: DC
Start: 2019-12-01 — End: 2019-12-12

## 2019-12-01 NOTE — Assessment & Plan Note (Signed)
Deteriorated.  Pt reports BP has been consistently elevated recently- other appts, HD.  Currently asymptomatic.  Increase Hydralazine to 100mg  BID and monitor closely for improvement

## 2019-12-01 NOTE — Patient Instructions (Signed)
Follow up in 1 month to recheck blood pressure INCREASE the Hydralazine to 100mg  twice daily (new prescription sent) Start the Lyrica as directed for the foot pain Call with any questions or concerns Stay Safe!  Stay Healthy!

## 2019-12-01 NOTE — Progress Notes (Signed)
   Subjective:    Patient ID: [name] [name], female    DOB: 1955-10-06, 64 y.o.   MRN: [id number]  HPI Foot pain- went to [facility] and was given 'pain medicine' (Lyrica) and 'come off the gabapentin.  Pt reports balls of feet will go numb and ache when she is sitting.  Has f/u scheduled for October.  Also has pain w/ standing- pain is actually worse w/ standing.  Feet 'ache real bad at night' when she is lying down.  No swelling.  HTN- chronic problem, on Amlodipine 10mg  daily, Hydralazine 50mg  BID.  BP is elevated today.  Pt reports BP 'stays up'.  No CP, HAs, visual changes, edema.  Occasional SOB.  Pt will feel heart is racing when going up and down the stairs.   Review of Systems For ROS see HPI   This visit occurred during the SARS-CoV-2 public health emergency.  Safety protocols were in place, including screening questions prior to the visit, additional usage of staff PPE, and extensive cleaning of exam room while observing appropriate contact time as indicated for disinfecting solutions.       Objective:   Physical Exam Vitals reviewed.  Constitutional:      General: She is not in acute distress.    Appearance: She is well-developed.  HENT:     Head: Normocephalic and atraumatic.  Eyes:     Conjunctiva/sclera: Conjunctivae normal.     Pupils: Pupils are equal, round, and reactive to light.  Neck:     Thyroid: No thyromegaly.  Cardiovascular:     Rate and Rhythm: Normal rate and regular rhythm.     Heart sounds: Normal heart sounds. No murmur heard.   Pulmonary:     Effort: Pulmonary effort is normal. No respiratory distress.     Breath sounds: Normal breath sounds.  Abdominal:     General: There is no distension.     Palpations: Abdomen is soft.     Tenderness: There is no abdominal tenderness.  Musculoskeletal:     Cervical back: Normal range of motion and neck supple.     Right lower leg: No edema.     Left lower leg: No edema.  Lymphadenopathy:      Cervical: No cervical adenopathy.  Skin:    General: Skin is warm and dry.  Neurological:     Mental Status: She is alert and oriented to person, place, and time.  Psychiatric:        Behavior: Behavior normal.           Assessment & Plan:  Foot pain- ongoing issue.  Pt was seen at [facility] and was switched to Lyrica from gabapentin.  Told her I agreed w/ this switch and to pick up medication.  Pt has f/u scheduled for October.  Will follow along and assist as able

## 2019-12-02 DIAGNOSIS — N186 End stage renal disease: Secondary | ICD-10-CM | POA: Diagnosis not present

## 2019-12-02 DIAGNOSIS — D631 Anemia in chronic kidney disease: Secondary | ICD-10-CM | POA: Diagnosis not present

## 2019-12-02 DIAGNOSIS — D509 Iron deficiency anemia, unspecified: Secondary | ICD-10-CM | POA: Diagnosis not present

## 2019-12-02 DIAGNOSIS — Z992 Dependence on renal dialysis: Secondary | ICD-10-CM | POA: Diagnosis not present

## 2019-12-02 DIAGNOSIS — N2581 Secondary hyperparathyroidism of renal origin: Secondary | ICD-10-CM | POA: Diagnosis not present

## 2019-12-02 DIAGNOSIS — L299 Pruritus, unspecified: Secondary | ICD-10-CM | POA: Diagnosis not present

## 2019-12-02 DIAGNOSIS — E1129 Type 2 diabetes mellitus with other diabetic kidney complication: Secondary | ICD-10-CM | POA: Diagnosis not present

## 2019-12-02 DIAGNOSIS — D689 Coagulation defect, unspecified: Secondary | ICD-10-CM | POA: Diagnosis not present

## 2019-12-05 DIAGNOSIS — L299 Pruritus, unspecified: Secondary | ICD-10-CM | POA: Diagnosis not present

## 2019-12-05 DIAGNOSIS — Z992 Dependence on renal dialysis: Secondary | ICD-10-CM | POA: Diagnosis not present

## 2019-12-05 DIAGNOSIS — D631 Anemia in chronic kidney disease: Secondary | ICD-10-CM | POA: Diagnosis not present

## 2019-12-05 DIAGNOSIS — E1129 Type 2 diabetes mellitus with other diabetic kidney complication: Secondary | ICD-10-CM | POA: Diagnosis not present

## 2019-12-05 DIAGNOSIS — N186 End stage renal disease: Secondary | ICD-10-CM | POA: Diagnosis not present

## 2019-12-05 DIAGNOSIS — N2581 Secondary hyperparathyroidism of renal origin: Secondary | ICD-10-CM | POA: Diagnosis not present

## 2019-12-05 DIAGNOSIS — D689 Coagulation defect, unspecified: Secondary | ICD-10-CM | POA: Diagnosis not present

## 2019-12-05 DIAGNOSIS — D509 Iron deficiency anemia, unspecified: Secondary | ICD-10-CM | POA: Diagnosis not present

## 2019-12-07 DIAGNOSIS — D509 Iron deficiency anemia, unspecified: Secondary | ICD-10-CM | POA: Diagnosis not present

## 2019-12-07 DIAGNOSIS — N2581 Secondary hyperparathyroidism of renal origin: Secondary | ICD-10-CM | POA: Diagnosis not present

## 2019-12-07 DIAGNOSIS — N186 End stage renal disease: Secondary | ICD-10-CM | POA: Diagnosis not present

## 2019-12-07 DIAGNOSIS — D631 Anemia in chronic kidney disease: Secondary | ICD-10-CM | POA: Diagnosis not present

## 2019-12-07 DIAGNOSIS — D689 Coagulation defect, unspecified: Secondary | ICD-10-CM | POA: Diagnosis not present

## 2019-12-07 DIAGNOSIS — E1129 Type 2 diabetes mellitus with other diabetic kidney complication: Secondary | ICD-10-CM | POA: Diagnosis not present

## 2019-12-07 DIAGNOSIS — L299 Pruritus, unspecified: Secondary | ICD-10-CM | POA: Diagnosis not present

## 2019-12-07 DIAGNOSIS — Z992 Dependence on renal dialysis: Secondary | ICD-10-CM | POA: Diagnosis not present

## 2019-12-09 DIAGNOSIS — D631 Anemia in chronic kidney disease: Secondary | ICD-10-CM | POA: Diagnosis not present

## 2019-12-09 DIAGNOSIS — E1129 Type 2 diabetes mellitus with other diabetic kidney complication: Secondary | ICD-10-CM | POA: Diagnosis not present

## 2019-12-09 DIAGNOSIS — Z992 Dependence on renal dialysis: Secondary | ICD-10-CM | POA: Diagnosis not present

## 2019-12-09 DIAGNOSIS — D689 Coagulation defect, unspecified: Secondary | ICD-10-CM | POA: Diagnosis not present

## 2019-12-09 DIAGNOSIS — D509 Iron deficiency anemia, unspecified: Secondary | ICD-10-CM | POA: Diagnosis not present

## 2019-12-09 DIAGNOSIS — N2581 Secondary hyperparathyroidism of renal origin: Secondary | ICD-10-CM | POA: Diagnosis not present

## 2019-12-09 DIAGNOSIS — N186 End stage renal disease: Secondary | ICD-10-CM | POA: Diagnosis not present

## 2019-12-09 DIAGNOSIS — L299 Pruritus, unspecified: Secondary | ICD-10-CM | POA: Diagnosis not present

## 2019-12-10 ENCOUNTER — Other Ambulatory Visit: Payer: Self-pay | Admitting: Family Medicine

## 2019-12-12 DIAGNOSIS — L299 Pruritus, unspecified: Secondary | ICD-10-CM | POA: Diagnosis not present

## 2019-12-12 DIAGNOSIS — D689 Coagulation defect, unspecified: Secondary | ICD-10-CM | POA: Diagnosis not present

## 2019-12-12 DIAGNOSIS — D631 Anemia in chronic kidney disease: Secondary | ICD-10-CM | POA: Diagnosis not present

## 2019-12-12 DIAGNOSIS — N2581 Secondary hyperparathyroidism of renal origin: Secondary | ICD-10-CM | POA: Diagnosis not present

## 2019-12-12 DIAGNOSIS — D509 Iron deficiency anemia, unspecified: Secondary | ICD-10-CM | POA: Diagnosis not present

## 2019-12-12 DIAGNOSIS — Z992 Dependence on renal dialysis: Secondary | ICD-10-CM | POA: Diagnosis not present

## 2019-12-12 DIAGNOSIS — E1129 Type 2 diabetes mellitus with other diabetic kidney complication: Secondary | ICD-10-CM | POA: Diagnosis not present

## 2019-12-12 DIAGNOSIS — N186 End stage renal disease: Secondary | ICD-10-CM | POA: Diagnosis not present

## 2019-12-14 DIAGNOSIS — N186 End stage renal disease: Secondary | ICD-10-CM | POA: Diagnosis not present

## 2019-12-14 DIAGNOSIS — N2581 Secondary hyperparathyroidism of renal origin: Secondary | ICD-10-CM | POA: Diagnosis not present

## 2019-12-14 DIAGNOSIS — D689 Coagulation defect, unspecified: Secondary | ICD-10-CM | POA: Diagnosis not present

## 2019-12-14 DIAGNOSIS — E1129 Type 2 diabetes mellitus with other diabetic kidney complication: Secondary | ICD-10-CM | POA: Diagnosis not present

## 2019-12-14 DIAGNOSIS — D509 Iron deficiency anemia, unspecified: Secondary | ICD-10-CM | POA: Diagnosis not present

## 2019-12-14 DIAGNOSIS — L299 Pruritus, unspecified: Secondary | ICD-10-CM | POA: Diagnosis not present

## 2019-12-14 DIAGNOSIS — D631 Anemia in chronic kidney disease: Secondary | ICD-10-CM | POA: Diagnosis not present

## 2019-12-14 DIAGNOSIS — Z992 Dependence on renal dialysis: Secondary | ICD-10-CM | POA: Diagnosis not present

## 2019-12-15 ENCOUNTER — Telehealth: Payer: Self-pay | Admitting: Family Medicine

## 2019-12-15 ENCOUNTER — Ambulatory Visit (INDEPENDENT_AMBULATORY_CARE_PROVIDER_SITE_OTHER): Payer: Medicare HMO | Admitting: Family Medicine

## 2019-12-15 ENCOUNTER — Other Ambulatory Visit: Payer: Self-pay

## 2019-12-15 ENCOUNTER — Encounter: Payer: Self-pay | Admitting: Family Medicine

## 2019-12-15 VITALS — BP 160/84 | HR 76 | Temp 98.0°F | Ht 66.0 in | Wt 176.0 lb

## 2019-12-15 DIAGNOSIS — M25552 Pain in left hip: Secondary | ICD-10-CM | POA: Diagnosis not present

## 2019-12-15 DIAGNOSIS — T50905A Adverse effect of unspecified drugs, medicaments and biological substances, initial encounter: Secondary | ICD-10-CM

## 2019-12-15 DIAGNOSIS — R0602 Shortness of breath: Secondary | ICD-10-CM

## 2019-12-15 NOTE — Patient Instructions (Addendum)
Thank you for coming in today.  Continue the lyrica (pregabalin) prescribed by your podiatrist at [facility] on 11/29/19.  Do not take it with gabapentin.  If the jerking or tremor worsens or does not improve let me or your podiatrist know. May reduce the dose a bit or just take it once daily.   Recheck with me as needed.   I am happy to see you int he future for this or other issues if needed.   I will send notes to Dr Charisse March podiatry.

## 2019-12-15 NOTE — Progress Notes (Signed)
° °  I, Wendy Poet, LAT, ATC, am serving as scribe for Dr. Lynne Leader.  [name] is a 64 y.o. female who presents to [place] at [facility] today for f/u of L hip pain.  She was last seen by Dr. Georgina Snell on 11/03/19 and was referred to PT.  Since her last visit, pt reports that her pain is so much better.  Her podiatrist prescribed Lyrica at 50 mg twice daily which helped quite a bit to control her pain.  She notes she is had a little bit of tremor with the Lyrica but that is improving some.  Overall she is pretty happy with how things are going.    Diagnostic imaging: L hip XR- 09/22/19   Pertinent review of systems: No fevers or chills  Relevant historical information: Diabetes and ESRD on dialysis   Exam:  BP (!) 160/84 (BP Location: Right Arm, Patient Position: Sitting, Cuff Size: Normal)    Pulse 76    Temp 98 F (36.7 C)    Ht 5\' 6"  (1.676 m)    Wt 176 lb (79.8 kg)    SpO2 98%    BMI 28.41 kg/m  General: Well Developed, well nourished, and in no acute distress.   MSK: Legs normal motion normal gait    Assessment and Plan: 64 y.o. female with lateral hip pain originally due to trochanteric bursitis/hip abductor tendinopathy.  This problem effectively has resolved itself with home exercise program and time.  She never was able to access physical therapy.  As she does not hurt very much now I do not think there is much utility in sending to physical therapy.  Discussed that she certainly can recheck back as needed.  Her podiatrist prescribed some Lyrica for foot paresthesias which seem to be working pretty well.  She notes she had a little bit more tremor with it.  It is possible that her dose could be adjusted a bit lower down.  Recommend that she discuss the problem with her podiatrist again if needed.   PDMP reviewed during this encounter. No orders of the defined types were placed in this encounter.  No orders of the defined types were placed in this  encounter.    Discussed warning signs or symptoms. Please see discharge instructions. Patient expresses understanding.   The above documentation has been reviewed and is accurate and complete Lynne Leader, M.D.  Total encounter time 20 minutes including face-to-face time with the patient and, reviewing past medical record, and charting on the date of service.

## 2019-12-15 NOTE — Telephone Encounter (Signed)
Patient is requesting a RX for oxygen.  patient states she has times when she gets short of breathe and her current inhaler does not help.  Please advise ?

## 2019-12-16 DIAGNOSIS — D689 Coagulation defect, unspecified: Secondary | ICD-10-CM | POA: Diagnosis not present

## 2019-12-16 DIAGNOSIS — E1129 Type 2 diabetes mellitus with other diabetic kidney complication: Secondary | ICD-10-CM | POA: Diagnosis not present

## 2019-12-16 DIAGNOSIS — Z992 Dependence on renal dialysis: Secondary | ICD-10-CM | POA: Diagnosis not present

## 2019-12-16 DIAGNOSIS — L299 Pruritus, unspecified: Secondary | ICD-10-CM | POA: Diagnosis not present

## 2019-12-16 DIAGNOSIS — D631 Anemia in chronic kidney disease: Secondary | ICD-10-CM | POA: Diagnosis not present

## 2019-12-16 DIAGNOSIS — D509 Iron deficiency anemia, unspecified: Secondary | ICD-10-CM | POA: Diagnosis not present

## 2019-12-16 DIAGNOSIS — N186 End stage renal disease: Secondary | ICD-10-CM | POA: Diagnosis not present

## 2019-12-16 DIAGNOSIS — N2581 Secondary hyperparathyroidism of renal origin: Secondary | ICD-10-CM | POA: Diagnosis not present

## 2019-12-16 NOTE — Telephone Encounter (Signed)
Left a vm message for the patient to call the office.

## 2019-12-16 NOTE — Telephone Encounter (Signed)
We can not just write a prescription for O2.  There are certain criteria that need to be met.  We can do a pulmonary referral for shortness of breath to see what exactly she needs

## 2019-12-16 NOTE — Telephone Encounter (Signed)
Would you prefer for patient to have a least a mychart visit first?

## 2019-12-19 DIAGNOSIS — D631 Anemia in chronic kidney disease: Secondary | ICD-10-CM | POA: Diagnosis not present

## 2019-12-19 DIAGNOSIS — N2581 Secondary hyperparathyroidism of renal origin: Secondary | ICD-10-CM | POA: Diagnosis not present

## 2019-12-19 DIAGNOSIS — Z992 Dependence on renal dialysis: Secondary | ICD-10-CM | POA: Diagnosis not present

## 2019-12-19 DIAGNOSIS — L299 Pruritus, unspecified: Secondary | ICD-10-CM | POA: Diagnosis not present

## 2019-12-19 DIAGNOSIS — D509 Iron deficiency anemia, unspecified: Secondary | ICD-10-CM | POA: Diagnosis not present

## 2019-12-19 DIAGNOSIS — N186 End stage renal disease: Secondary | ICD-10-CM | POA: Diagnosis not present

## 2019-12-19 DIAGNOSIS — E1129 Type 2 diabetes mellitus with other diabetic kidney complication: Secondary | ICD-10-CM | POA: Diagnosis not present

## 2019-12-19 DIAGNOSIS — D689 Coagulation defect, unspecified: Secondary | ICD-10-CM | POA: Diagnosis not present

## 2019-12-20 NOTE — Telephone Encounter (Signed)
Left a vm message for the patient to call the office back. 

## 2019-12-21 DIAGNOSIS — N2581 Secondary hyperparathyroidism of renal origin: Secondary | ICD-10-CM | POA: Diagnosis not present

## 2019-12-21 DIAGNOSIS — N186 End stage renal disease: Secondary | ICD-10-CM | POA: Diagnosis not present

## 2019-12-21 DIAGNOSIS — D689 Coagulation defect, unspecified: Secondary | ICD-10-CM | POA: Diagnosis not present

## 2019-12-21 DIAGNOSIS — D509 Iron deficiency anemia, unspecified: Secondary | ICD-10-CM | POA: Diagnosis not present

## 2019-12-21 DIAGNOSIS — D631 Anemia in chronic kidney disease: Secondary | ICD-10-CM | POA: Diagnosis not present

## 2019-12-21 DIAGNOSIS — L299 Pruritus, unspecified: Secondary | ICD-10-CM | POA: Diagnosis not present

## 2019-12-21 DIAGNOSIS — Z992 Dependence on renal dialysis: Secondary | ICD-10-CM | POA: Diagnosis not present

## 2019-12-21 DIAGNOSIS — E1129 Type 2 diabetes mellitus with other diabetic kidney complication: Secondary | ICD-10-CM | POA: Diagnosis not present

## 2019-12-22 DIAGNOSIS — Z992 Dependence on renal dialysis: Secondary | ICD-10-CM | POA: Diagnosis not present

## 2019-12-22 DIAGNOSIS — N186 End stage renal disease: Secondary | ICD-10-CM | POA: Diagnosis not present

## 2019-12-22 DIAGNOSIS — I150 Renovascular hypertension: Secondary | ICD-10-CM | POA: Diagnosis not present

## 2019-12-22 NOTE — Telephone Encounter (Signed)
Referral placed.

## 2019-12-22 NOTE — Addendum Note (Signed)
Addended by: Davis Gourd on: 12/22/2019 02:10 PM   Modules accepted: Orders

## 2019-12-22 NOTE — Telephone Encounter (Signed)
Pt is ok with the referral to Pulmonary.

## 2019-12-23 DIAGNOSIS — L299 Pruritus, unspecified: Secondary | ICD-10-CM | POA: Diagnosis not present

## 2019-12-23 DIAGNOSIS — N2581 Secondary hyperparathyroidism of renal origin: Secondary | ICD-10-CM | POA: Diagnosis not present

## 2019-12-23 DIAGNOSIS — N186 End stage renal disease: Secondary | ICD-10-CM | POA: Diagnosis not present

## 2019-12-23 DIAGNOSIS — Z992 Dependence on renal dialysis: Secondary | ICD-10-CM | POA: Diagnosis not present

## 2019-12-23 DIAGNOSIS — E1129 Type 2 diabetes mellitus with other diabetic kidney complication: Secondary | ICD-10-CM | POA: Diagnosis not present

## 2019-12-23 DIAGNOSIS — D689 Coagulation defect, unspecified: Secondary | ICD-10-CM | POA: Diagnosis not present

## 2019-12-23 DIAGNOSIS — D631 Anemia in chronic kidney disease: Secondary | ICD-10-CM | POA: Diagnosis not present

## 2019-12-26 DIAGNOSIS — D631 Anemia in chronic kidney disease: Secondary | ICD-10-CM | POA: Diagnosis not present

## 2019-12-26 DIAGNOSIS — E1129 Type 2 diabetes mellitus with other diabetic kidney complication: Secondary | ICD-10-CM | POA: Diagnosis not present

## 2019-12-26 DIAGNOSIS — L299 Pruritus, unspecified: Secondary | ICD-10-CM | POA: Diagnosis not present

## 2019-12-26 DIAGNOSIS — D689 Coagulation defect, unspecified: Secondary | ICD-10-CM | POA: Diagnosis not present

## 2019-12-26 DIAGNOSIS — Z992 Dependence on renal dialysis: Secondary | ICD-10-CM | POA: Diagnosis not present

## 2019-12-26 DIAGNOSIS — N186 End stage renal disease: Secondary | ICD-10-CM | POA: Diagnosis not present

## 2019-12-26 DIAGNOSIS — N2581 Secondary hyperparathyroidism of renal origin: Secondary | ICD-10-CM | POA: Diagnosis not present

## 2019-12-27 ENCOUNTER — Other Ambulatory Visit: Payer: Self-pay

## 2019-12-27 ENCOUNTER — Encounter: Payer: Self-pay | Admitting: Family Medicine

## 2019-12-27 ENCOUNTER — Ambulatory Visit (INDEPENDENT_AMBULATORY_CARE_PROVIDER_SITE_OTHER): Payer: Medicare HMO | Admitting: Family Medicine

## 2019-12-27 VITALS — BP 143/81 | HR 87 | Temp 98.4°F | Resp 17 | Ht 66.0 in | Wt 172.5 lb

## 2019-12-27 DIAGNOSIS — R432 Parageusia: Secondary | ICD-10-CM

## 2019-12-27 DIAGNOSIS — I10 Essential (primary) hypertension: Secondary | ICD-10-CM | POA: Diagnosis not present

## 2019-12-27 NOTE — Patient Instructions (Signed)
Follow up as needed or as scheduled PLEASE get a COVID test to see if this is why you lost your taste ([facility], [facility], [employer], or [employer]) If the COVID test is negative, the next step will be to refer to a neurologist to evaluate your loss of taste No medication changes at this time- BP looks good! Call with any questions or concerns Stay Safe!  Stay Healthy!

## 2019-12-27 NOTE — Progress Notes (Signed)
   Subjective:    Patient ID: [name] [name], female    DOB: 1956/01/22, 64 y.o.   MRN: [id number]  HPI HTN- chronic problem, Hydralazine was increased to 100mg  BID at last visit.  She remains on Amlodipine 10mg  daily.  Not on ACE/ARB due to angioedema.  BP is much improved today- 143/81 (down from 160/84).  No HAs, CP, SOB above baseline.  Pt reports less fatigue w/ better BP control.  Reports BP at HD has been similar to today's reading.  Loss of taste- pt reports that for the last 2 weeks she has been unable to taste food.  Able to smell the food but can't taste it.  No hx of similar.  No known contact w/ COVID.  Denies fevers, chills, body aches, N/V/D.   Review of Systems For ROS see HPI   This visit occurred during the SARS-CoV-2 public health emergency.  Safety protocols were in place, including screening questions prior to the visit, additional usage of staff PPE, and extensive cleaning of exam room while observing appropriate contact time as indicated for disinfecting solutions.       Objective:   Physical Exam Vitals reviewed.  Constitutional:      General: She is not in acute distress.    Appearance: Normal appearance. She is well-developed.  HENT:     Head: Normocephalic and atraumatic.  Eyes:     Conjunctiva/sclera: Conjunctivae normal.     Pupils: Pupils are equal, round, and reactive to light.  Neck:     Thyroid: No thyromegaly.  Cardiovascular:     Rate and Rhythm: Normal rate and regular rhythm.     Heart sounds: Normal heart sounds. No murmur heard.   Pulmonary:     Effort: Pulmonary effort is normal. No respiratory distress.     Breath sounds: Normal breath sounds.  Abdominal:     General: There is no distension.     Palpations: Abdomen is soft.     Tenderness: There is no abdominal tenderness.  Musculoskeletal:     Cervical back: Normal range of motion and neck supple.  Lymphadenopathy:     Cervical: No cervical adenopathy.  Skin:    General: Skin  is warm and dry.  Neurological:     Mental Status: She is alert and oriented to person, place, and time.  Psychiatric:        Behavior: Behavior normal.           Assessment & Plan:  Loss of taste- this is pt's biggest concern today.  Reports this started suddenly 2 weeks ago and has not been able to taste food- regardless of how spicy or strong it may be.  Denies other sxs of COVID but told her that she would need to be tested to be sure.  If COVID test is negative, will need neurology referral.  Pt expressed understanding and is in agreement w/ plan.

## 2019-12-28 DIAGNOSIS — N2581 Secondary hyperparathyroidism of renal origin: Secondary | ICD-10-CM | POA: Diagnosis not present

## 2019-12-28 DIAGNOSIS — E1129 Type 2 diabetes mellitus with other diabetic kidney complication: Secondary | ICD-10-CM | POA: Diagnosis not present

## 2019-12-28 DIAGNOSIS — N186 End stage renal disease: Secondary | ICD-10-CM | POA: Diagnosis not present

## 2019-12-28 DIAGNOSIS — Z992 Dependence on renal dialysis: Secondary | ICD-10-CM | POA: Diagnosis not present

## 2019-12-28 DIAGNOSIS — D689 Coagulation defect, unspecified: Secondary | ICD-10-CM | POA: Diagnosis not present

## 2019-12-28 DIAGNOSIS — L299 Pruritus, unspecified: Secondary | ICD-10-CM | POA: Diagnosis not present

## 2019-12-28 DIAGNOSIS — D631 Anemia in chronic kidney disease: Secondary | ICD-10-CM | POA: Diagnosis not present

## 2019-12-29 ENCOUNTER — Other Ambulatory Visit: Payer: Medicare HMO

## 2019-12-29 ENCOUNTER — Telehealth: Payer: Self-pay | Admitting: Pulmonary Disease

## 2019-12-29 DIAGNOSIS — Z20822 Contact with and (suspected) exposure to covid-19: Secondary | ICD-10-CM

## 2019-12-29 NOTE — Assessment & Plan Note (Signed)
BP is much better since increasing Hydralazine to 100mg  BID and continuing Amlodipine 10mg  daily.  She reports feeling better w/ improved BPs.

## 2019-12-30 DIAGNOSIS — N2581 Secondary hyperparathyroidism of renal origin: Secondary | ICD-10-CM | POA: Diagnosis not present

## 2019-12-30 DIAGNOSIS — N186 End stage renal disease: Secondary | ICD-10-CM | POA: Diagnosis not present

## 2019-12-30 DIAGNOSIS — L299 Pruritus, unspecified: Secondary | ICD-10-CM | POA: Diagnosis not present

## 2019-12-30 DIAGNOSIS — D631 Anemia in chronic kidney disease: Secondary | ICD-10-CM | POA: Diagnosis not present

## 2019-12-30 DIAGNOSIS — Z992 Dependence on renal dialysis: Secondary | ICD-10-CM | POA: Diagnosis not present

## 2019-12-30 DIAGNOSIS — E1129 Type 2 diabetes mellitus with other diabetic kidney complication: Secondary | ICD-10-CM | POA: Diagnosis not present

## 2019-12-30 DIAGNOSIS — D689 Coagulation defect, unspecified: Secondary | ICD-10-CM | POA: Diagnosis not present

## 2019-12-31 LAB — NOVEL CORONAVIRUS, NAA: SARS-CoV-2, NAA: NOT DETECTED

## 2019-12-31 LAB — SARS-COV-2, NAA 2 DAY TAT

## 2020-01-02 DIAGNOSIS — E1129 Type 2 diabetes mellitus with other diabetic kidney complication: Secondary | ICD-10-CM | POA: Diagnosis not present

## 2020-01-02 DIAGNOSIS — L299 Pruritus, unspecified: Secondary | ICD-10-CM | POA: Diagnosis not present

## 2020-01-02 DIAGNOSIS — Z992 Dependence on renal dialysis: Secondary | ICD-10-CM | POA: Diagnosis not present

## 2020-01-02 DIAGNOSIS — D631 Anemia in chronic kidney disease: Secondary | ICD-10-CM | POA: Diagnosis not present

## 2020-01-02 DIAGNOSIS — N186 End stage renal disease: Secondary | ICD-10-CM | POA: Diagnosis not present

## 2020-01-02 DIAGNOSIS — N2581 Secondary hyperparathyroidism of renal origin: Secondary | ICD-10-CM | POA: Diagnosis not present

## 2020-01-02 DIAGNOSIS — D689 Coagulation defect, unspecified: Secondary | ICD-10-CM | POA: Diagnosis not present

## 2020-01-04 DIAGNOSIS — N186 End stage renal disease: Secondary | ICD-10-CM | POA: Diagnosis not present

## 2020-01-04 DIAGNOSIS — Z992 Dependence on renal dialysis: Secondary | ICD-10-CM | POA: Diagnosis not present

## 2020-01-04 DIAGNOSIS — E1129 Type 2 diabetes mellitus with other diabetic kidney complication: Secondary | ICD-10-CM | POA: Diagnosis not present

## 2020-01-04 DIAGNOSIS — N2581 Secondary hyperparathyroidism of renal origin: Secondary | ICD-10-CM | POA: Diagnosis not present

## 2020-01-04 DIAGNOSIS — D689 Coagulation defect, unspecified: Secondary | ICD-10-CM | POA: Diagnosis not present

## 2020-01-04 DIAGNOSIS — L299 Pruritus, unspecified: Secondary | ICD-10-CM | POA: Diagnosis not present

## 2020-01-04 DIAGNOSIS — D631 Anemia in chronic kidney disease: Secondary | ICD-10-CM | POA: Diagnosis not present

## 2020-01-05 ENCOUNTER — Encounter: Payer: Self-pay | Admitting: Pulmonary Disease

## 2020-01-05 ENCOUNTER — Institutional Professional Consult (permissible substitution): Payer: Medicare HMO | Admitting: Internal Medicine

## 2020-01-05 ENCOUNTER — Other Ambulatory Visit: Payer: Self-pay

## 2020-01-05 ENCOUNTER — Ambulatory Visit (INDEPENDENT_AMBULATORY_CARE_PROVIDER_SITE_OTHER): Payer: Medicare HMO | Admitting: Pulmonary Disease

## 2020-01-05 VITALS — BP 138/80 | HR 92 | Temp 98.4°F | Ht 66.0 in | Wt 180.2 lb

## 2020-01-05 DIAGNOSIS — R059 Cough, unspecified: Secondary | ICD-10-CM | POA: Diagnosis not present

## 2020-01-05 DIAGNOSIS — J9611 Chronic respiratory failure with hypoxia: Secondary | ICD-10-CM

## 2020-01-05 DIAGNOSIS — R0602 Shortness of breath: Secondary | ICD-10-CM

## 2020-01-05 DIAGNOSIS — R918 Other nonspecific abnormal finding of lung field: Secondary | ICD-10-CM | POA: Diagnosis not present

## 2020-01-05 NOTE — Assessment & Plan Note (Signed)
Plan: Walk today in office required 2 L of O2 with physical exertion We'll place order for POC

## 2020-01-05 NOTE — Addendum Note (Signed)
Addended by: Vanessa Barbara on: 01/05/2020 03:34 PM   Modules accepted: Orders

## 2020-01-05 NOTE — Progress Notes (Signed)
_0  ID: [name] [name], female    DOB: [birth year], 64 y.o.   MRN: [id number]  Chief Complaint  Patient presents with  . Follow-up    shortness of breath with activity    Referring provider: Midge Minium, MD  HPI:  64 year old female former smoker followed in our office for dyspnea on exertion  PMH: Hypertension, GERD, end-stage renal disease on dialysis, type 2 diabetes Smoker/ Smoking History: Former smoker Maintenance: None Pt of: [name] [name]  01/05/2020  - Visit   64 year old female former smoker followed in our office by [name] [name]. Patient was last seen in our office in March/2020. This was her initial consult visit. Plan of care from that office visit was as follows: Chest x-ray, obtain pulmonary function testing. Depending on findings of pulmonary function testing may need to consider CT imaging. Patient was requested to follow back up in 4 to 6 weeks.  Follow-up was delayed due to the onset of the COVID-19 pandemic.  Patient never obtain pulmonary function testing.  Patient presenting back to our office today as a follow-up.  She continues to report dyspnea on exertion.  She is not on any maintenance inhalers.  She also has a dry cough.  She denies any history of any autoimmune or connective tissue disorders.  She denies any environmental exposures or occupational exposures that she is aware of.  She is maintained on Monday Wednesday Friday hemodialysis.  She still utilizes her rescue inhaler 2 times a day.  She continues to have an ongoing dry cough.  Occasional wheezing.  Patient was walked in office today on room air and oxygen levels dropped to 85%.  She required 2 L of O2 with physical exertion.   Questionaires / Pulmonary Flowsheets:   ACT:  No flowsheet data found.  MMRC: mMRC Dyspnea Scale mMRC Score  01/05/2020 2    Epworth:  No flowsheet data found.  Tests:    07/05/2018-CT chest without contrast-subtle patchy bilateral heterogeneous airspace  process as described, findings may be due to infection including atypical infection such as viral pneumonia as well as possible inflammatory disease, recommend follow-up CT chest in 4 to 6 weeks  07/28/2019-chest x-ray-mild bilateral perihilar and bibasilar interstitial prominence may which may reflect mild interstitial edema  09/01/2019-chest x-ray-persistently increased perihilar and bibasilar interstitial markings which may reflect edema versus atypical/viral infection, there are small rounded opacities within the peripheral aspect of the bilateral lung bases which may reflect an alveolar component of edema or multifocal infection in the appropriate clinical setting, radiographic follow-up to resolution is recommended  FENO:  No results found for: NITRICOXIDE  PFT: No flowsheet data found.  WALK:  SIX MIN WALK 01/05/2020  Supplimental Oxygen during Test? (L/min) Yes  O2 Flow Rate 2  Type Continuous  Tech Comments: Patient walked at a slow pace, sat dropped to 85% on RA after 1st lap complained of some sob, stated she felt better with oxygen, 2L applied, sats up to 97%.    Imaging: No results found.  Lab Results:  CBC    Component Value Date/Time   WBC 7.8 09/01/2019 1819   RBC 3.43 (L) 09/01/2019 1819   HGB 10.4 (L) 09/01/2019 1819   HCT 33.5 (L) 09/01/2019 1819   PLT 222 09/01/2019 1819   MCV 97.7 09/01/2019 1819   MCH 30.3 09/01/2019 1819   MCHC 31.0 09/01/2019 1819   RDW 16.6 (H) 09/01/2019 1819   LYMPHSABS 1.6 09/01/2019 1819   MONOABS 0.5 09/01/2019 1819  EOSABS 0.2 09/01/2019 1819   BASOSABS 0.1 09/01/2019 1819    BMET    Component Value Date/Time   NA 139 10/13/2019 1203   NA 139 07/05/2019 0940   K 5.0 10/13/2019 1203   CL 96 10/13/2019 1203   CO2 34 (H) 10/13/2019 1203   GLUCOSE 97 10/13/2019 1203   BUN 24 (H) 10/13/2019 1203   BUN 22 07/05/2019 0940   CREATININE 7.00 (HH) 10/13/2019 1203   CREATININE 4.46 (H) 03/26/2017 1627   CALCIUM 9.3 10/13/2019  1203   GFRNONAA 6 (L) 09/01/2019 1819   GFRAA 7 (L) 09/01/2019 1819    BNP    Component Value Date/Time   BNP 1,103.5 (H) 09/01/2019 1819    ProBNP No results found for: PROBNP  Specialty Problems      Pulmonary Problems   Cough variant asthma   Chronic respiratory failure with hypoxia (HCC)   Cough   Shortness of breath      Allergies  Allergen Reactions  . Lisinopril Shortness Of Breath, Swelling and Other (See Comments)    Throat irritation also. Patient takes losartan and tolerates fine.  [name] [name] [Oxaprozin] Hives and Dermatitis    Immunization History  Administered Date(s) Administered  . Hepatitis B, adult 04/17/2017, 05/20/2017, 06/19/2017, 10/16/2017, 04/21/2018, 04/27/2019  . Influenza,inj,Quad PF,6+ Mos 10/28/2015, 05/07/2017, 12/24/2017, 12/20/2018, 12/01/2019  . PFIZER SARS-COV-2 Vaccination 06/04/2019, 06/25/2019, 12/30/2019  . Pneumococcal Conjugate-13 07/14/2018  . Pneumococcal Polysaccharide-23 10/01/2015  . Tdap 02/03/2019    Past Medical History:  Diagnosis Date  . Adenomatous colon polyp 04/1998  . Anemia   . Anxiety   . Arthritis   . Carotid artery disease ([place])    Carotid US 1/18: bilateral ICA 1-39, R thyroid lobe nodule (1.9x2.2x3cm); numerous L thyroid lobe nodules - repeat 1 year  . Chronic kidney disease    M/W/[facility]  . Chronic renal failure    post transplant  . Depression   . Diabetes mellitus    diet controlled  . EBV infection    Per [facility]  . Encephalitis    NMDA per [facility]  . Esophagitis    Grade 1 Distal  . Focal seizure (HCC)    .  Last one 06/2016  . GERD (gastroesophageal reflux disease)   . Hemorrhoids   . History of blood transfusion   . History of kidney stones    Passed  . History of subdural hematoma   . Hx of cardiovascular stress test    Lexiscan Myoview (06/2013):  No ischemia, EF 66%; normal.  //  Myoview 12/17: EF 62, no ischemia or scar; Normal  . Hx of echocardiogram    a. Echocardiogram (06/2013):   Mod focal basal hypertrophy, EF 60-65%, normal wall motion, Gr 1 DD, mild AI, mildly dilated ascending aorta (41 mm), mild LAE.; b.  Echo 9/16: mod LVH, EF 60-65%, no RWMA, Gr 1 DD, trivial AI, mild dilated ascending aorta, mild LAE  . Hyperkalemia   . Hyperlipidemia   . Hypertension   . Hypomagnesemia   . Left jugular vein thrombosis    Partial resolved per [facility]  . Metabolic acidosis   . Pneumonia   . Right jugular vein thrombosis    resolved from [facility]  . Seizures ([place])     Tobacco History: Social History   Tobacco Use  Smoking Status Former Smoker  Smokeless Tobacco Never Used  Tobacco Comment   smoked in teens   Counseling given: Not Answered Comment: smoked in teens  Continue to not smoke  Outpatient Encounter Medications as of 01/05/2020  Medication Sig  . acetaminophen (TYLENOL) 650 MG CR tablet Take 650-1,300 mg by mouth every 8 (eight) hours as needed for pain.   [name] albuterol (VENTOLIN HFA) 108 (90 Base) MCG/ACT inhaler INHALE 2 PUFFS INTO THE LUNGS EVERY 4 (FOUR) HOURS AS NEEDED FOR WHEEZING OR SHORTNESS OF BREATH.  [name] amLODipine (NORVASC) 10 MG tablet TAKE 1 TABLET BY MOUTH EVERY DAY  . atorvastatin (LIPITOR) 40 MG tablet Take 1 tablet (40 mg total) by mouth daily.  . blood glucose meter kit and supplies KIT Dispense based on patient and insurance preference. Patient should test sugars twice daily as directed. Dx. E11.9  . cetirizine (ZYRTEC) 10 MG tablet TAKE 1 TABLET BY MOUTH EVERY DAY (Patient taking differently: Take 10 mg by mouth daily. )  . DULoxetine (CYMBALTA) 20 MG capsule TAKE 1 CAPSULE BY MOUTH EVERY DAY  . ferric citrate (AURYXIA) 1 GM 210 MG(Fe) tablet Take 210 mg by mouth See admin instructions. Take 210 mg by mouth three times a day with meals and 210 mg two times a day with snacks  . glucose blood (ONETOUCH ULTRA) test strip Use as instructed to test sugars twice daily. Dx. E11.9  . hydrALAZINE (APRESOLINE) 100 MG tablet TAKE 1 TABLET BY MOUTH TWICE A  DAY  . HYDROcodone-acetaminophen (NORCO/VICODIN) 5-325 MG tablet Take 1 tablet by mouth every 6 (six) hours as needed for moderate pain.  . isosorbide dinitrate (ISORDIL) 20 MG tablet TAKE 1 TABLET BY MOUTH THREE TIMES A DAY (Patient taking differently: Take 20 mg by mouth 3 (three) times daily. )  . loratadine (CLARITIN) 10 MG tablet Take 10 mg by mouth daily as needed for allergies.  . Methoxy PEG-Epoetin Beta (MIRCERA IJ) Mircera  . multivitamin (RENA-VIT) TABS tablet Take 1 tablet by mouth every morning.   . nitroGLYCERIN (NITROSTAT) 0.4 MG SL tablet Place 0.4 mg under the tongue every 5 (five) minutes as needed for chest pain.   [name] omeprazole (PRILOSEC) 40 MG capsule TAKE 1 CAPSULE BY MOUTH EVERY DAY  . pregabalin (LYRICA) 50 MG capsule Take 50 mg by mouth 2 (two) times daily.  . QUEtiapine (SEROQUEL) 100 MG tablet Take 2 tablets (200 mg total) by mouth 2 (two) times daily.  . traZODone (DESYREL) 50 MG tablet Take 50 mg by mouth at bedtime as needed for sleep.   . [DISCONTINUED] meclizine (ANTIVERT) 25 MG tablet TAKE 1 TABLET (25 MG TOTAL) BY MOUTH 3 (THREE) TIMES DAILY AS NEEDED FOR DIZZINESS. (Patient not taking: Reported on 01/05/2020)   No facility-administered encounter medications on file as of 01/05/2020.     Review of Systems  Review of Systems  Constitutional: Positive for fatigue. Negative for activity change and fever.  HENT: Negative for sinus pressure, sinus pain and sore throat.   Respiratory: Positive for cough, shortness of breath and wheezing.   Cardiovascular: Negative for chest pain and palpitations.  Musculoskeletal: Negative for arthralgias.  Neurological: Negative for dizziness.  Psychiatric/Behavioral: Negative for sleep disturbance. The patient is not nervous/anxious.      Physical Exam  BP 138/80 (BP Location: Right Arm, Cuff Size: Normal)   Pulse 92   Temp 98.4 F (36.9 C) (Other (Comment)) Comment (Src): wrist  Ht _0  (1.676 m)   Wt 180 lb 3.2 oz  (81.7 kg)   SpO2 92% Comment: room air  BMI 29.09 kg/m   Wt Readings from Last 5 Encounters:  01/05/20 180 lb 3.2  oz (81.7 kg)  12/27/19 172 lb 8 oz (78.2 kg)  12/15/19 176 lb (79.8 kg)  12/01/19 171 lb 8 oz (77.8 kg)  11/03/19 176 lb 3.2 oz (79.9 kg)    BMI Readings from Last 5 Encounters:  01/05/20 29.09 kg/m  12/27/19 27.84 kg/m  12/15/19 28.41 kg/m  12/01/19 27.68 kg/m  11/03/19 28.44 kg/m     Physical Exam Vitals and nursing note reviewed.  Constitutional:      General: She is not in acute distress.    Appearance: Normal appearance. She is obese.  HENT:     Head: Normocephalic and atraumatic.     Right Ear: Tympanic membrane, ear canal and external ear normal. There is no impacted cerumen.     Left Ear: Tympanic membrane, ear canal and external ear normal. There is no impacted cerumen.     Nose: Nose normal. No congestion or rhinorrhea.     Mouth/Throat:     Mouth: Mucous membranes are moist.     Pharynx: Oropharynx is clear.  Eyes:     Pupils: Pupils are equal, round, and reactive to light.  Cardiovascular:     Rate and Rhythm: Normal rate and regular rhythm.     Pulses: Normal pulses.     Heart sounds: Normal heart sounds. No murmur heard.   Pulmonary:     Effort: Pulmonary effort is normal. No respiratory distress.     Breath sounds: Normal breath sounds. No decreased air movement. No decreased breath sounds, wheezing or rales.  Musculoskeletal:     Cervical back: Normal range of motion.  Skin:    General: Skin is warm and dry.     Capillary Refill: Capillary refill takes less than 2 seconds.  Neurological:     General: No focal deficit present.     Mental Status: She is alert and oriented to person, place, and time. Mental status is at baseline.     Gait: Gait normal.  Psychiatric:        Mood and Affect: Mood normal.        Behavior: Behavior normal.        Thought Content: Thought content normal.        Judgment: Judgment normal.        Assessment & Plan:   Discussion: Given patient's persistent dyspnea on exertion that has slightly progressed over the last year as well as dry cough and hazy opacities on CT imaging.  I believe it is reasonable to obtain pulmonary function testing as well as a high-resolution CT of her chest to compare to previous imaging from last year.  If high-resolution CT chest shows interstitial lung disease we will need to obtain blood work to rule out connective tissue disorders as well as obtain a ILD questionnaire.  We'll bring patient back in the short-term to review those results in person.  Abnormal findings on diagnostic imaging of lung Persistent hazy opacities on chest x-ray and CT last year Walk today in office patient had oxygen desaturations  Plan: We'll order high-resolution CT chest to further evaluate, if interstitial lung disease is seen patient will need connective tissue lab work Pulmonary function testing ordered Walk today in office  Shortness of breath Plan: Walk today in office, patient required 2 L of O2 with physical exertion High-resolution CT chest ordered Pulmonary function testing ordered 6-week follow-up with [name] [name]  Cough Persistent dry cough  Plan: We'll further evaluate the high-resolution CT chest We'll obtain pulmonary function testing  Chronic respiratory  failure with hypoxia ([place]) Plan: Walk today in office required 2 L of O2 with physical exertion We'll place order for POC    Return in about 6 weeks (around 02/16/2020), or if symptoms worsen or fail to improve, for Follow up with Dr. Ander Slade, Follow up for FULL PFT - 60 min.   Lauraine Rinne, NP 01/05/2020   This appointment required 35 minutes of patient care (this includes precharting, chart review, review of results, face-to-face care, etc.).

## 2020-01-05 NOTE — Assessment & Plan Note (Signed)
Persistent hazy opacities on chest x-ray and CT last year Walk today in office patient had oxygen desaturations  Plan: We'll order high-resolution CT chest to further evaluate, if interstitial lung disease is seen patient will need connective tissue lab work Pulmonary function testing ordered Walk today in office

## 2020-01-05 NOTE — Assessment & Plan Note (Signed)
Persistent dry cough  Plan: We'll further evaluate the high-resolution CT chest We'll obtain pulmonary function testing

## 2020-01-05 NOTE — Assessment & Plan Note (Signed)
Plan: Walk today in office, patient required 2 L of O2 with physical exertion High-resolution CT chest ordered Pulmonary function testing ordered 6-week follow-up with Dr. Jenetta Downer

## 2020-01-05 NOTE — Patient Instructions (Addendum)
You were seen today by Lauraine Rinne, NP  for:   1. Shortness of breath 2. Cough 3. Abnormal findings on diagnostic imaging of lung  - Pulmonary function test; Future - CT Chest High Resolution; Future  Walk today in office >>> Oxygen levels dropped to 85% on room air requiring 2 L of O2 with physical exertion  Continue oxygen therapy as prescribed  >>>maintain oxygen saturations greater than 88 percent  >>>if unable to maintain oxygen saturations please contact the office  >>>do not smoke with oxygen  >>>can use nasal saline gel or nasal saline rinses to moisturize nose if oxygen causes dryness  We'll order a breathing test to further evaluate the shortness of breath  I have ordered a high-resolution CT chest to further evaluate your history of abnormal chest x-rays as well as your CT from last year  We recommend today:  Orders Placed This Encounter  Procedures  . CT Chest High Resolution    -High-res CT with supine and prone positioning -inspiratory and expiratory cuts.  -Only to be read by Dr. Rosario Jacks and Dr. Weber Cooks.    Standing Status:   Future    Standing Expiration Date:   01/04/2021    Order Specific Question:   Preferred imaging location?    Answer:   [place]  . Pulmonary function test    Standing Status:   Future    Standing Expiration Date:   01/04/2021    Order Specific Question:   Where should this test be performed?    Answer:   [facility]    Order Specific Question:   Full PFT: includes the following: basic spirometry, spirometry pre & post bronchodilator, diffusion capacity (DLCO), lung volumes    Answer:   Full PFT   Orders Placed This Encounter  Procedures  . CT Chest High Resolution  . Pulmonary function test   No orders of the defined types were placed in this encounter.   Follow Up:    Return in about 6 weeks (around 02/16/2020), or if symptoms worsen or fail to improve, for Follow up with Dr. Ander Slade, Follow up for FULL PFT  - 60 min.   Notification of test results are managed in the following manner: If there are  any recommendations or changes to the  plan of care discussed in office today,  we will contact you and let you know what they are. If you do not hear from [place], then your results are normal and you can view them through your  MyChart account , or a letter will be sent to you. Thank you again for trusting [place] with your care  - Thank you, [facility]    It is flu season:   >>> Best ways to protect herself from the flu: Receive the yearly flu vaccine, practice good hand hygiene washing with soap and also using hand sanitizer when available, eat a nutritious meals, get adequate rest, hydrate appropriately       Please contact the office if your symptoms worsen or you have concerns that you are not improving.   Thank you for choosing [facility] for your healthcare, and for allowing [place] to partner with you on your healthcare journey. I am thankful to be able to provide care to you today.   Wyn Quaker FNP-C

## 2020-01-06 ENCOUNTER — Other Ambulatory Visit: Payer: Self-pay | Admitting: Family Medicine

## 2020-01-06 ENCOUNTER — Other Ambulatory Visit: Payer: Self-pay | Admitting: General Practice

## 2020-01-06 DIAGNOSIS — D689 Coagulation defect, unspecified: Secondary | ICD-10-CM | POA: Diagnosis not present

## 2020-01-06 DIAGNOSIS — L299 Pruritus, unspecified: Secondary | ICD-10-CM | POA: Diagnosis not present

## 2020-01-06 DIAGNOSIS — N186 End stage renal disease: Secondary | ICD-10-CM | POA: Diagnosis not present

## 2020-01-06 DIAGNOSIS — E1129 Type 2 diabetes mellitus with other diabetic kidney complication: Secondary | ICD-10-CM | POA: Diagnosis not present

## 2020-01-06 DIAGNOSIS — Z992 Dependence on renal dialysis: Secondary | ICD-10-CM | POA: Diagnosis not present

## 2020-01-06 DIAGNOSIS — D631 Anemia in chronic kidney disease: Secondary | ICD-10-CM | POA: Diagnosis not present

## 2020-01-06 DIAGNOSIS — N2581 Secondary hyperparathyroidism of renal origin: Secondary | ICD-10-CM | POA: Diagnosis not present

## 2020-01-06 MED ORDER — QUETIAPINE FUMARATE 100 MG PO TABS: ORAL_TABLET | ORAL | 1 refills | Status: DC

## 2020-01-08 DIAGNOSIS — R0602 Shortness of breath: Secondary | ICD-10-CM | POA: Diagnosis not present

## 2020-01-09 DIAGNOSIS — Z992 Dependence on renal dialysis: Secondary | ICD-10-CM | POA: Diagnosis not present

## 2020-01-09 DIAGNOSIS — D631 Anemia in chronic kidney disease: Secondary | ICD-10-CM | POA: Diagnosis not present

## 2020-01-09 DIAGNOSIS — N2581 Secondary hyperparathyroidism of renal origin: Secondary | ICD-10-CM | POA: Diagnosis not present

## 2020-01-09 DIAGNOSIS — D689 Coagulation defect, unspecified: Secondary | ICD-10-CM | POA: Diagnosis not present

## 2020-01-09 DIAGNOSIS — E1129 Type 2 diabetes mellitus with other diabetic kidney complication: Secondary | ICD-10-CM | POA: Diagnosis not present

## 2020-01-09 DIAGNOSIS — L299 Pruritus, unspecified: Secondary | ICD-10-CM | POA: Diagnosis not present

## 2020-01-09 DIAGNOSIS — N186 End stage renal disease: Secondary | ICD-10-CM | POA: Diagnosis not present

## 2020-01-10 DIAGNOSIS — M792 Neuralgia and neuritis, unspecified: Secondary | ICD-10-CM | POA: Diagnosis not present

## 2020-01-11 DIAGNOSIS — N2581 Secondary hyperparathyroidism of renal origin: Secondary | ICD-10-CM | POA: Diagnosis not present

## 2020-01-11 DIAGNOSIS — E1129 Type 2 diabetes mellitus with other diabetic kidney complication: Secondary | ICD-10-CM | POA: Diagnosis not present

## 2020-01-11 DIAGNOSIS — Z992 Dependence on renal dialysis: Secondary | ICD-10-CM | POA: Diagnosis not present

## 2020-01-11 DIAGNOSIS — D631 Anemia in chronic kidney disease: Secondary | ICD-10-CM | POA: Diagnosis not present

## 2020-01-11 DIAGNOSIS — L299 Pruritus, unspecified: Secondary | ICD-10-CM | POA: Diagnosis not present

## 2020-01-11 DIAGNOSIS — N186 End stage renal disease: Secondary | ICD-10-CM | POA: Diagnosis not present

## 2020-01-11 DIAGNOSIS — D689 Coagulation defect, unspecified: Secondary | ICD-10-CM | POA: Diagnosis not present

## 2020-01-13 DIAGNOSIS — D631 Anemia in chronic kidney disease: Secondary | ICD-10-CM | POA: Diagnosis not present

## 2020-01-13 DIAGNOSIS — N2581 Secondary hyperparathyroidism of renal origin: Secondary | ICD-10-CM | POA: Diagnosis not present

## 2020-01-13 DIAGNOSIS — N186 End stage renal disease: Secondary | ICD-10-CM | POA: Diagnosis not present

## 2020-01-13 DIAGNOSIS — L299 Pruritus, unspecified: Secondary | ICD-10-CM | POA: Diagnosis not present

## 2020-01-13 DIAGNOSIS — E1129 Type 2 diabetes mellitus with other diabetic kidney complication: Secondary | ICD-10-CM | POA: Diagnosis not present

## 2020-01-13 DIAGNOSIS — Z992 Dependence on renal dialysis: Secondary | ICD-10-CM | POA: Diagnosis not present

## 2020-01-13 DIAGNOSIS — D689 Coagulation defect, unspecified: Secondary | ICD-10-CM | POA: Diagnosis not present

## 2020-01-16 DIAGNOSIS — D631 Anemia in chronic kidney disease: Secondary | ICD-10-CM | POA: Diagnosis not present

## 2020-01-16 DIAGNOSIS — Z992 Dependence on renal dialysis: Secondary | ICD-10-CM | POA: Diagnosis not present

## 2020-01-16 DIAGNOSIS — D689 Coagulation defect, unspecified: Secondary | ICD-10-CM | POA: Diagnosis not present

## 2020-01-16 DIAGNOSIS — L299 Pruritus, unspecified: Secondary | ICD-10-CM | POA: Diagnosis not present

## 2020-01-16 DIAGNOSIS — N2581 Secondary hyperparathyroidism of renal origin: Secondary | ICD-10-CM | POA: Diagnosis not present

## 2020-01-16 DIAGNOSIS — N186 End stage renal disease: Secondary | ICD-10-CM | POA: Diagnosis not present

## 2020-01-16 DIAGNOSIS — E1129 Type 2 diabetes mellitus with other diabetic kidney complication: Secondary | ICD-10-CM | POA: Diagnosis not present

## 2020-01-17 ENCOUNTER — Other Ambulatory Visit: Payer: Self-pay

## 2020-01-17 ENCOUNTER — Ambulatory Visit (HOSPITAL_COMMUNITY)
Admission: RE | Admit: 2020-01-17 | Discharge: 2020-01-17 | Disposition: A | Discharge status: Home / Self Care | Payer: Medicare HMO | Source: Ambulatory Visit | Attending: Pulmonary Disease | Admitting: Pulmonary Disease

## 2020-01-17 DIAGNOSIS — R0602 Shortness of breath: Secondary | ICD-10-CM | POA: Diagnosis not present

## 2020-01-17 DIAGNOSIS — J984 Other disorders of lung: Secondary | ICD-10-CM | POA: Diagnosis not present

## 2020-01-17 DIAGNOSIS — R918 Other nonspecific abnormal finding of lung field: Secondary | ICD-10-CM

## 2020-01-18 ENCOUNTER — Other Ambulatory Visit: Payer: Self-pay | Admitting: Pulmonary Disease

## 2020-01-18 DIAGNOSIS — N2581 Secondary hyperparathyroidism of renal origin: Secondary | ICD-10-CM | POA: Diagnosis not present

## 2020-01-18 DIAGNOSIS — E1129 Type 2 diabetes mellitus with other diabetic kidney complication: Secondary | ICD-10-CM | POA: Diagnosis not present

## 2020-01-18 DIAGNOSIS — D689 Coagulation defect, unspecified: Secondary | ICD-10-CM | POA: Diagnosis not present

## 2020-01-18 DIAGNOSIS — D631 Anemia in chronic kidney disease: Secondary | ICD-10-CM | POA: Diagnosis not present

## 2020-01-18 DIAGNOSIS — J849 Interstitial pulmonary disease, unspecified: Secondary | ICD-10-CM

## 2020-01-18 DIAGNOSIS — Z992 Dependence on renal dialysis: Secondary | ICD-10-CM | POA: Diagnosis not present

## 2020-01-18 DIAGNOSIS — N186 End stage renal disease: Secondary | ICD-10-CM | POA: Diagnosis not present

## 2020-01-18 DIAGNOSIS — L299 Pruritus, unspecified: Secondary | ICD-10-CM | POA: Diagnosis not present

## 2020-01-18 NOTE — Progress Notes (Signed)
high

## 2020-01-20 DIAGNOSIS — N2581 Secondary hyperparathyroidism of renal origin: Secondary | ICD-10-CM | POA: Diagnosis not present

## 2020-01-20 DIAGNOSIS — E1129 Type 2 diabetes mellitus with other diabetic kidney complication: Secondary | ICD-10-CM | POA: Diagnosis not present

## 2020-01-20 DIAGNOSIS — Z992 Dependence on renal dialysis: Secondary | ICD-10-CM | POA: Diagnosis not present

## 2020-01-20 DIAGNOSIS — D631 Anemia in chronic kidney disease: Secondary | ICD-10-CM | POA: Diagnosis not present

## 2020-01-20 DIAGNOSIS — L299 Pruritus, unspecified: Secondary | ICD-10-CM | POA: Diagnosis not present

## 2020-01-20 DIAGNOSIS — N186 End stage renal disease: Secondary | ICD-10-CM | POA: Diagnosis not present

## 2020-01-20 DIAGNOSIS — D689 Coagulation defect, unspecified: Secondary | ICD-10-CM | POA: Diagnosis not present

## 2020-01-22 DIAGNOSIS — I150 Renovascular hypertension: Secondary | ICD-10-CM | POA: Diagnosis not present

## 2020-01-22 DIAGNOSIS — N186 End stage renal disease: Secondary | ICD-10-CM | POA: Diagnosis not present

## 2020-01-22 DIAGNOSIS — Z992 Dependence on renal dialysis: Secondary | ICD-10-CM | POA: Diagnosis not present

## 2020-01-23 DIAGNOSIS — L299 Pruritus, unspecified: Secondary | ICD-10-CM | POA: Diagnosis not present

## 2020-01-23 DIAGNOSIS — D631 Anemia in chronic kidney disease: Secondary | ICD-10-CM | POA: Diagnosis not present

## 2020-01-23 DIAGNOSIS — E1129 Type 2 diabetes mellitus with other diabetic kidney complication: Secondary | ICD-10-CM | POA: Diagnosis not present

## 2020-01-23 DIAGNOSIS — N186 End stage renal disease: Secondary | ICD-10-CM | POA: Diagnosis not present

## 2020-01-23 DIAGNOSIS — Z992 Dependence on renal dialysis: Secondary | ICD-10-CM | POA: Diagnosis not present

## 2020-01-23 DIAGNOSIS — N2581 Secondary hyperparathyroidism of renal origin: Secondary | ICD-10-CM | POA: Diagnosis not present

## 2020-01-23 DIAGNOSIS — D509 Iron deficiency anemia, unspecified: Secondary | ICD-10-CM | POA: Diagnosis not present

## 2020-01-23 DIAGNOSIS — D689 Coagulation defect, unspecified: Secondary | ICD-10-CM | POA: Diagnosis not present

## 2020-01-25 DIAGNOSIS — E1129 Type 2 diabetes mellitus with other diabetic kidney complication: Secondary | ICD-10-CM | POA: Diagnosis not present

## 2020-01-25 DIAGNOSIS — N2581 Secondary hyperparathyroidism of renal origin: Secondary | ICD-10-CM | POA: Diagnosis not present

## 2020-01-25 DIAGNOSIS — Z992 Dependence on renal dialysis: Secondary | ICD-10-CM | POA: Diagnosis not present

## 2020-01-25 DIAGNOSIS — D631 Anemia in chronic kidney disease: Secondary | ICD-10-CM | POA: Diagnosis not present

## 2020-01-25 DIAGNOSIS — L299 Pruritus, unspecified: Secondary | ICD-10-CM | POA: Diagnosis not present

## 2020-01-25 DIAGNOSIS — D689 Coagulation defect, unspecified: Secondary | ICD-10-CM | POA: Diagnosis not present

## 2020-01-25 DIAGNOSIS — D509 Iron deficiency anemia, unspecified: Secondary | ICD-10-CM | POA: Diagnosis not present

## 2020-01-25 DIAGNOSIS — N186 End stage renal disease: Secondary | ICD-10-CM | POA: Diagnosis not present

## 2020-01-27 DIAGNOSIS — D631 Anemia in chronic kidney disease: Secondary | ICD-10-CM | POA: Diagnosis not present

## 2020-01-27 DIAGNOSIS — E1129 Type 2 diabetes mellitus with other diabetic kidney complication: Secondary | ICD-10-CM | POA: Diagnosis not present

## 2020-01-27 DIAGNOSIS — D689 Coagulation defect, unspecified: Secondary | ICD-10-CM | POA: Diagnosis not present

## 2020-01-27 DIAGNOSIS — D509 Iron deficiency anemia, unspecified: Secondary | ICD-10-CM | POA: Diagnosis not present

## 2020-01-27 DIAGNOSIS — Z992 Dependence on renal dialysis: Secondary | ICD-10-CM | POA: Diagnosis not present

## 2020-01-27 DIAGNOSIS — N186 End stage renal disease: Secondary | ICD-10-CM | POA: Diagnosis not present

## 2020-01-27 DIAGNOSIS — N2581 Secondary hyperparathyroidism of renal origin: Secondary | ICD-10-CM | POA: Diagnosis not present

## 2020-01-27 DIAGNOSIS — L299 Pruritus, unspecified: Secondary | ICD-10-CM | POA: Diagnosis not present

## 2020-01-30 DIAGNOSIS — E1129 Type 2 diabetes mellitus with other diabetic kidney complication: Secondary | ICD-10-CM | POA: Diagnosis not present

## 2020-01-30 DIAGNOSIS — D509 Iron deficiency anemia, unspecified: Secondary | ICD-10-CM | POA: Diagnosis not present

## 2020-01-30 DIAGNOSIS — D689 Coagulation defect, unspecified: Secondary | ICD-10-CM | POA: Diagnosis not present

## 2020-01-30 DIAGNOSIS — D631 Anemia in chronic kidney disease: Secondary | ICD-10-CM | POA: Diagnosis not present

## 2020-01-30 DIAGNOSIS — N186 End stage renal disease: Secondary | ICD-10-CM | POA: Diagnosis not present

## 2020-01-30 DIAGNOSIS — Z992 Dependence on renal dialysis: Secondary | ICD-10-CM | POA: Diagnosis not present

## 2020-01-30 DIAGNOSIS — N2581 Secondary hyperparathyroidism of renal origin: Secondary | ICD-10-CM | POA: Diagnosis not present

## 2020-01-30 DIAGNOSIS — L299 Pruritus, unspecified: Secondary | ICD-10-CM | POA: Diagnosis not present

## 2020-02-01 DIAGNOSIS — D509 Iron deficiency anemia, unspecified: Secondary | ICD-10-CM | POA: Diagnosis not present

## 2020-02-01 DIAGNOSIS — N2581 Secondary hyperparathyroidism of renal origin: Secondary | ICD-10-CM | POA: Diagnosis not present

## 2020-02-01 DIAGNOSIS — D689 Coagulation defect, unspecified: Secondary | ICD-10-CM | POA: Diagnosis not present

## 2020-02-01 DIAGNOSIS — L299 Pruritus, unspecified: Secondary | ICD-10-CM | POA: Diagnosis not present

## 2020-02-01 DIAGNOSIS — E1129 Type 2 diabetes mellitus with other diabetic kidney complication: Secondary | ICD-10-CM | POA: Diagnosis not present

## 2020-02-01 DIAGNOSIS — D631 Anemia in chronic kidney disease: Secondary | ICD-10-CM | POA: Diagnosis not present

## 2020-02-01 DIAGNOSIS — N186 End stage renal disease: Secondary | ICD-10-CM | POA: Diagnosis not present

## 2020-02-01 DIAGNOSIS — Z992 Dependence on renal dialysis: Secondary | ICD-10-CM | POA: Diagnosis not present

## 2020-02-03 DIAGNOSIS — Z992 Dependence on renal dialysis: Secondary | ICD-10-CM | POA: Diagnosis not present

## 2020-02-03 DIAGNOSIS — N186 End stage renal disease: Secondary | ICD-10-CM | POA: Diagnosis not present

## 2020-02-03 DIAGNOSIS — D689 Coagulation defect, unspecified: Secondary | ICD-10-CM | POA: Diagnosis not present

## 2020-02-03 DIAGNOSIS — N2581 Secondary hyperparathyroidism of renal origin: Secondary | ICD-10-CM | POA: Diagnosis not present

## 2020-02-03 DIAGNOSIS — E1129 Type 2 diabetes mellitus with other diabetic kidney complication: Secondary | ICD-10-CM | POA: Diagnosis not present

## 2020-02-03 DIAGNOSIS — D631 Anemia in chronic kidney disease: Secondary | ICD-10-CM | POA: Diagnosis not present

## 2020-02-03 DIAGNOSIS — D509 Iron deficiency anemia, unspecified: Secondary | ICD-10-CM | POA: Diagnosis not present

## 2020-02-03 DIAGNOSIS — L299 Pruritus, unspecified: Secondary | ICD-10-CM | POA: Diagnosis not present

## 2020-02-06 ENCOUNTER — Telehealth: Payer: Self-pay

## 2020-02-06 DIAGNOSIS — D689 Coagulation defect, unspecified: Secondary | ICD-10-CM | POA: Diagnosis not present

## 2020-02-06 DIAGNOSIS — D509 Iron deficiency anemia, unspecified: Secondary | ICD-10-CM | POA: Diagnosis not present

## 2020-02-06 DIAGNOSIS — N186 End stage renal disease: Secondary | ICD-10-CM | POA: Diagnosis not present

## 2020-02-06 DIAGNOSIS — E1129 Type 2 diabetes mellitus with other diabetic kidney complication: Secondary | ICD-10-CM | POA: Diagnosis not present

## 2020-02-06 DIAGNOSIS — N2581 Secondary hyperparathyroidism of renal origin: Secondary | ICD-10-CM | POA: Diagnosis not present

## 2020-02-06 DIAGNOSIS — Z992 Dependence on renal dialysis: Secondary | ICD-10-CM | POA: Diagnosis not present

## 2020-02-06 DIAGNOSIS — D631 Anemia in chronic kidney disease: Secondary | ICD-10-CM | POA: Diagnosis not present

## 2020-02-06 DIAGNOSIS — L299 Pruritus, unspecified: Secondary | ICD-10-CM | POA: Diagnosis not present

## 2020-02-06 NOTE — Telephone Encounter (Signed)
Received referral from Dr. Justin Mend requesting fistulagram due to reduced access flows - To scheduling

## 2020-02-07 ENCOUNTER — Telehealth: Payer: Self-pay | Admitting: Family Medicine

## 2020-02-07 NOTE — Progress Notes (Signed)
  Chronic Care Management   Outreach Note  02/07/2020 Name: [name] [name] MRN: [id number] DOB: [birth year]  Referred by: Midge Minium, MD Reason for referral : No chief complaint on file.   An unsuccessful telephone outreach was attempted today. The patient was referred to the pharmacist for assistance with care management and care coordination.   Follow Up Plan:   Lauretta Grill Upstream Scheduler

## 2020-02-08 DIAGNOSIS — Z992 Dependence on renal dialysis: Secondary | ICD-10-CM | POA: Diagnosis not present

## 2020-02-08 DIAGNOSIS — E1129 Type 2 diabetes mellitus with other diabetic kidney complication: Secondary | ICD-10-CM | POA: Diagnosis not present

## 2020-02-08 DIAGNOSIS — D631 Anemia in chronic kidney disease: Secondary | ICD-10-CM | POA: Diagnosis not present

## 2020-02-08 DIAGNOSIS — D509 Iron deficiency anemia, unspecified: Secondary | ICD-10-CM | POA: Diagnosis not present

## 2020-02-08 DIAGNOSIS — R0602 Shortness of breath: Secondary | ICD-10-CM | POA: Diagnosis not present

## 2020-02-08 DIAGNOSIS — N186 End stage renal disease: Secondary | ICD-10-CM | POA: Diagnosis not present

## 2020-02-08 DIAGNOSIS — L299 Pruritus, unspecified: Secondary | ICD-10-CM | POA: Diagnosis not present

## 2020-02-08 DIAGNOSIS — N2581 Secondary hyperparathyroidism of renal origin: Secondary | ICD-10-CM | POA: Diagnosis not present

## 2020-02-08 DIAGNOSIS — D689 Coagulation defect, unspecified: Secondary | ICD-10-CM | POA: Diagnosis not present

## 2020-02-10 DIAGNOSIS — D689 Coagulation defect, unspecified: Secondary | ICD-10-CM | POA: Diagnosis not present

## 2020-02-10 DIAGNOSIS — Z992 Dependence on renal dialysis: Secondary | ICD-10-CM | POA: Diagnosis not present

## 2020-02-10 DIAGNOSIS — E1129 Type 2 diabetes mellitus with other diabetic kidney complication: Secondary | ICD-10-CM | POA: Diagnosis not present

## 2020-02-10 DIAGNOSIS — D631 Anemia in chronic kidney disease: Secondary | ICD-10-CM | POA: Diagnosis not present

## 2020-02-10 DIAGNOSIS — N186 End stage renal disease: Secondary | ICD-10-CM | POA: Diagnosis not present

## 2020-02-10 DIAGNOSIS — N2581 Secondary hyperparathyroidism of renal origin: Secondary | ICD-10-CM | POA: Diagnosis not present

## 2020-02-10 DIAGNOSIS — D509 Iron deficiency anemia, unspecified: Secondary | ICD-10-CM | POA: Diagnosis not present

## 2020-02-10 DIAGNOSIS — L299 Pruritus, unspecified: Secondary | ICD-10-CM | POA: Diagnosis not present

## 2020-02-12 DIAGNOSIS — D689 Coagulation defect, unspecified: Secondary | ICD-10-CM | POA: Diagnosis not present

## 2020-02-12 DIAGNOSIS — D509 Iron deficiency anemia, unspecified: Secondary | ICD-10-CM | POA: Diagnosis not present

## 2020-02-12 DIAGNOSIS — Z992 Dependence on renal dialysis: Secondary | ICD-10-CM | POA: Diagnosis not present

## 2020-02-12 DIAGNOSIS — L299 Pruritus, unspecified: Secondary | ICD-10-CM | POA: Diagnosis not present

## 2020-02-12 DIAGNOSIS — N2581 Secondary hyperparathyroidism of renal origin: Secondary | ICD-10-CM | POA: Diagnosis not present

## 2020-02-12 DIAGNOSIS — N186 End stage renal disease: Secondary | ICD-10-CM | POA: Diagnosis not present

## 2020-02-12 DIAGNOSIS — D631 Anemia in chronic kidney disease: Secondary | ICD-10-CM | POA: Diagnosis not present

## 2020-02-12 DIAGNOSIS — E1129 Type 2 diabetes mellitus with other diabetic kidney complication: Secondary | ICD-10-CM | POA: Diagnosis not present

## 2020-02-14 ENCOUNTER — Other Ambulatory Visit: Payer: Self-pay

## 2020-02-15 DIAGNOSIS — D509 Iron deficiency anemia, unspecified: Secondary | ICD-10-CM | POA: Diagnosis not present

## 2020-02-15 DIAGNOSIS — D689 Coagulation defect, unspecified: Secondary | ICD-10-CM | POA: Diagnosis not present

## 2020-02-15 DIAGNOSIS — N2581 Secondary hyperparathyroidism of renal origin: Secondary | ICD-10-CM | POA: Diagnosis not present

## 2020-02-15 DIAGNOSIS — N186 End stage renal disease: Secondary | ICD-10-CM | POA: Diagnosis not present

## 2020-02-15 DIAGNOSIS — E1129 Type 2 diabetes mellitus with other diabetic kidney complication: Secondary | ICD-10-CM | POA: Diagnosis not present

## 2020-02-15 DIAGNOSIS — D631 Anemia in chronic kidney disease: Secondary | ICD-10-CM | POA: Diagnosis not present

## 2020-02-15 DIAGNOSIS — Z992 Dependence on renal dialysis: Secondary | ICD-10-CM | POA: Diagnosis not present

## 2020-02-15 DIAGNOSIS — L299 Pruritus, unspecified: Secondary | ICD-10-CM | POA: Diagnosis not present

## 2020-02-17 DIAGNOSIS — D509 Iron deficiency anemia, unspecified: Secondary | ICD-10-CM | POA: Diagnosis not present

## 2020-02-17 DIAGNOSIS — D631 Anemia in chronic kidney disease: Secondary | ICD-10-CM | POA: Diagnosis not present

## 2020-02-17 DIAGNOSIS — Z992 Dependence on renal dialysis: Secondary | ICD-10-CM | POA: Diagnosis not present

## 2020-02-17 DIAGNOSIS — E1129 Type 2 diabetes mellitus with other diabetic kidney complication: Secondary | ICD-10-CM | POA: Diagnosis not present

## 2020-02-17 DIAGNOSIS — D689 Coagulation defect, unspecified: Secondary | ICD-10-CM | POA: Diagnosis not present

## 2020-02-17 DIAGNOSIS — L299 Pruritus, unspecified: Secondary | ICD-10-CM | POA: Diagnosis not present

## 2020-02-17 DIAGNOSIS — N2581 Secondary hyperparathyroidism of renal origin: Secondary | ICD-10-CM | POA: Diagnosis not present

## 2020-02-17 DIAGNOSIS — N186 End stage renal disease: Secondary | ICD-10-CM | POA: Diagnosis not present

## 2020-02-20 DIAGNOSIS — E1129 Type 2 diabetes mellitus with other diabetic kidney complication: Secondary | ICD-10-CM | POA: Diagnosis not present

## 2020-02-20 DIAGNOSIS — N186 End stage renal disease: Secondary | ICD-10-CM | POA: Diagnosis not present

## 2020-02-20 DIAGNOSIS — D631 Anemia in chronic kidney disease: Secondary | ICD-10-CM | POA: Diagnosis not present

## 2020-02-20 DIAGNOSIS — Z992 Dependence on renal dialysis: Secondary | ICD-10-CM | POA: Diagnosis not present

## 2020-02-20 DIAGNOSIS — N2581 Secondary hyperparathyroidism of renal origin: Secondary | ICD-10-CM | POA: Diagnosis not present

## 2020-02-20 DIAGNOSIS — D689 Coagulation defect, unspecified: Secondary | ICD-10-CM | POA: Diagnosis not present

## 2020-02-20 DIAGNOSIS — D509 Iron deficiency anemia, unspecified: Secondary | ICD-10-CM | POA: Diagnosis not present

## 2020-02-20 DIAGNOSIS — L299 Pruritus, unspecified: Secondary | ICD-10-CM | POA: Diagnosis not present

## 2020-02-21 ENCOUNTER — Ambulatory Visit: Payer: Medicare HMO | Admitting: Pulmonary Disease

## 2020-02-21 ENCOUNTER — Other Ambulatory Visit: Payer: Self-pay | Admitting: Family Medicine

## 2020-02-21 DIAGNOSIS — N186 End stage renal disease: Secondary | ICD-10-CM | POA: Diagnosis not present

## 2020-02-21 DIAGNOSIS — I150 Renovascular hypertension: Secondary | ICD-10-CM | POA: Diagnosis not present

## 2020-02-21 DIAGNOSIS — Z992 Dependence on renal dialysis: Secondary | ICD-10-CM | POA: Diagnosis not present

## 2020-02-22 DIAGNOSIS — N2581 Secondary hyperparathyroidism of renal origin: Secondary | ICD-10-CM | POA: Diagnosis not present

## 2020-02-22 DIAGNOSIS — N186 End stage renal disease: Secondary | ICD-10-CM | POA: Diagnosis not present

## 2020-02-22 DIAGNOSIS — L299 Pruritus, unspecified: Secondary | ICD-10-CM | POA: Diagnosis not present

## 2020-02-22 DIAGNOSIS — Z992 Dependence on renal dialysis: Secondary | ICD-10-CM | POA: Diagnosis not present

## 2020-02-22 DIAGNOSIS — D509 Iron deficiency anemia, unspecified: Secondary | ICD-10-CM | POA: Diagnosis not present

## 2020-02-22 DIAGNOSIS — E1129 Type 2 diabetes mellitus with other diabetic kidney complication: Secondary | ICD-10-CM | POA: Diagnosis not present

## 2020-02-22 DIAGNOSIS — D689 Coagulation defect, unspecified: Secondary | ICD-10-CM | POA: Diagnosis not present

## 2020-02-24 DIAGNOSIS — L299 Pruritus, unspecified: Secondary | ICD-10-CM | POA: Diagnosis not present

## 2020-02-24 DIAGNOSIS — N186 End stage renal disease: Secondary | ICD-10-CM | POA: Diagnosis not present

## 2020-02-24 DIAGNOSIS — N2581 Secondary hyperparathyroidism of renal origin: Secondary | ICD-10-CM | POA: Diagnosis not present

## 2020-02-24 DIAGNOSIS — Z992 Dependence on renal dialysis: Secondary | ICD-10-CM | POA: Diagnosis not present

## 2020-02-24 DIAGNOSIS — D689 Coagulation defect, unspecified: Secondary | ICD-10-CM | POA: Diagnosis not present

## 2020-02-24 DIAGNOSIS — E1129 Type 2 diabetes mellitus with other diabetic kidney complication: Secondary | ICD-10-CM | POA: Diagnosis not present

## 2020-02-24 DIAGNOSIS — D509 Iron deficiency anemia, unspecified: Secondary | ICD-10-CM | POA: Diagnosis not present

## 2020-02-27 DIAGNOSIS — L299 Pruritus, unspecified: Secondary | ICD-10-CM | POA: Diagnosis not present

## 2020-02-27 DIAGNOSIS — N186 End stage renal disease: Secondary | ICD-10-CM | POA: Diagnosis not present

## 2020-02-27 DIAGNOSIS — N2581 Secondary hyperparathyroidism of renal origin: Secondary | ICD-10-CM | POA: Diagnosis not present

## 2020-02-27 DIAGNOSIS — Z992 Dependence on renal dialysis: Secondary | ICD-10-CM | POA: Diagnosis not present

## 2020-02-27 DIAGNOSIS — D689 Coagulation defect, unspecified: Secondary | ICD-10-CM | POA: Diagnosis not present

## 2020-02-27 DIAGNOSIS — D509 Iron deficiency anemia, unspecified: Secondary | ICD-10-CM | POA: Diagnosis not present

## 2020-02-27 DIAGNOSIS — E1129 Type 2 diabetes mellitus with other diabetic kidney complication: Secondary | ICD-10-CM | POA: Diagnosis not present

## 2020-02-29 DIAGNOSIS — L299 Pruritus, unspecified: Secondary | ICD-10-CM | POA: Diagnosis not present

## 2020-02-29 DIAGNOSIS — D689 Coagulation defect, unspecified: Secondary | ICD-10-CM | POA: Diagnosis not present

## 2020-02-29 DIAGNOSIS — N186 End stage renal disease: Secondary | ICD-10-CM | POA: Diagnosis not present

## 2020-02-29 DIAGNOSIS — Z992 Dependence on renal dialysis: Secondary | ICD-10-CM | POA: Diagnosis not present

## 2020-02-29 DIAGNOSIS — D509 Iron deficiency anemia, unspecified: Secondary | ICD-10-CM | POA: Diagnosis not present

## 2020-02-29 DIAGNOSIS — N2581 Secondary hyperparathyroidism of renal origin: Secondary | ICD-10-CM | POA: Diagnosis not present

## 2020-02-29 DIAGNOSIS — E1129 Type 2 diabetes mellitus with other diabetic kidney complication: Secondary | ICD-10-CM | POA: Diagnosis not present

## 2020-03-02 DIAGNOSIS — N2581 Secondary hyperparathyroidism of renal origin: Secondary | ICD-10-CM | POA: Diagnosis not present

## 2020-03-02 DIAGNOSIS — E1129 Type 2 diabetes mellitus with other diabetic kidney complication: Secondary | ICD-10-CM | POA: Diagnosis not present

## 2020-03-02 DIAGNOSIS — N186 End stage renal disease: Secondary | ICD-10-CM | POA: Diagnosis not present

## 2020-03-02 DIAGNOSIS — D509 Iron deficiency anemia, unspecified: Secondary | ICD-10-CM | POA: Diagnosis not present

## 2020-03-02 DIAGNOSIS — Z992 Dependence on renal dialysis: Secondary | ICD-10-CM | POA: Diagnosis not present

## 2020-03-02 DIAGNOSIS — D689 Coagulation defect, unspecified: Secondary | ICD-10-CM | POA: Diagnosis not present

## 2020-03-02 DIAGNOSIS — L299 Pruritus, unspecified: Secondary | ICD-10-CM | POA: Diagnosis not present

## 2020-03-05 ENCOUNTER — Other Ambulatory Visit (HOSPITAL_COMMUNITY)
Admission: RE | Admit: 2020-03-05 | Discharge: 2020-03-05 | Disposition: A | Discharge status: Home / Self Care | Payer: Medicare HMO | Source: Ambulatory Visit | Attending: Vascular Surgery | Admitting: Vascular Surgery

## 2020-03-05 DIAGNOSIS — N186 End stage renal disease: Secondary | ICD-10-CM | POA: Diagnosis not present

## 2020-03-05 DIAGNOSIS — Z01812 Encounter for preprocedural laboratory examination: Secondary | ICD-10-CM | POA: Insufficient documentation

## 2020-03-05 DIAGNOSIS — E1129 Type 2 diabetes mellitus with other diabetic kidney complication: Secondary | ICD-10-CM | POA: Diagnosis not present

## 2020-03-05 DIAGNOSIS — N2581 Secondary hyperparathyroidism of renal origin: Secondary | ICD-10-CM | POA: Diagnosis not present

## 2020-03-05 DIAGNOSIS — Z20822 Contact with and (suspected) exposure to covid-19: Secondary | ICD-10-CM | POA: Diagnosis not present

## 2020-03-05 DIAGNOSIS — D509 Iron deficiency anemia, unspecified: Secondary | ICD-10-CM | POA: Diagnosis not present

## 2020-03-05 DIAGNOSIS — D689 Coagulation defect, unspecified: Secondary | ICD-10-CM | POA: Diagnosis not present

## 2020-03-05 DIAGNOSIS — Z992 Dependence on renal dialysis: Secondary | ICD-10-CM | POA: Diagnosis not present

## 2020-03-05 DIAGNOSIS — L299 Pruritus, unspecified: Secondary | ICD-10-CM | POA: Diagnosis not present

## 2020-03-05 LAB — SARS CORONAVIRUS 2 (TAT 6-24 HRS): SARS Coronavirus 2: NEGATIVE

## 2020-03-07 DIAGNOSIS — L299 Pruritus, unspecified: Secondary | ICD-10-CM | POA: Diagnosis not present

## 2020-03-07 DIAGNOSIS — D509 Iron deficiency anemia, unspecified: Secondary | ICD-10-CM | POA: Diagnosis not present

## 2020-03-07 DIAGNOSIS — Z992 Dependence on renal dialysis: Secondary | ICD-10-CM | POA: Diagnosis not present

## 2020-03-07 DIAGNOSIS — N186 End stage renal disease: Secondary | ICD-10-CM | POA: Diagnosis not present

## 2020-03-07 DIAGNOSIS — D689 Coagulation defect, unspecified: Secondary | ICD-10-CM | POA: Diagnosis not present

## 2020-03-07 DIAGNOSIS — N2581 Secondary hyperparathyroidism of renal origin: Secondary | ICD-10-CM | POA: Diagnosis not present

## 2020-03-07 DIAGNOSIS — E1129 Type 2 diabetes mellitus with other diabetic kidney complication: Secondary | ICD-10-CM | POA: Diagnosis not present

## 2020-03-08 ENCOUNTER — Ambulatory Visit (HOSPITAL_COMMUNITY)
Admission: RE | Admit: 2020-03-08 | Discharge: 2020-03-08 | Disposition: A | Discharge status: Home / Self Care | Payer: Medicare HMO | Attending: Vascular Surgery | Admitting: Vascular Surgery

## 2020-03-08 ENCOUNTER — Encounter (HOSPITAL_COMMUNITY): Payer: Self-pay | Admitting: Vascular Surgery

## 2020-03-08 ENCOUNTER — Other Ambulatory Visit: Payer: Self-pay

## 2020-03-08 ENCOUNTER — Encounter (HOSPITAL_COMMUNITY)
Admission: RE | Disposition: A | Discharge status: Home / Self Care | Payer: Self-pay | Source: Home / Self Care | Attending: Vascular Surgery

## 2020-03-08 DIAGNOSIS — T82858A Stenosis of vascular prosthetic devices, implants and grafts, initial encounter: Secondary | ICD-10-CM | POA: Diagnosis present

## 2020-03-08 DIAGNOSIS — I770 Arteriovenous fistula, acquired: Secondary | ICD-10-CM | POA: Diagnosis not present

## 2020-03-08 DIAGNOSIS — Z940 Kidney transplant status: Secondary | ICD-10-CM | POA: Insufficient documentation

## 2020-03-08 DIAGNOSIS — Z87891 Personal history of nicotine dependence: Secondary | ICD-10-CM | POA: Insufficient documentation

## 2020-03-08 DIAGNOSIS — Z79899 Other long term (current) drug therapy: Secondary | ICD-10-CM | POA: Diagnosis not present

## 2020-03-08 DIAGNOSIS — Z841 Family history of disorders of kidney and ureter: Secondary | ICD-10-CM | POA: Insufficient documentation

## 2020-03-08 DIAGNOSIS — Z8249 Family history of ischemic heart disease and other diseases of the circulatory system: Secondary | ICD-10-CM | POA: Diagnosis not present

## 2020-03-08 DIAGNOSIS — Y832 Surgical operation with anastomosis, bypass or graft as the cause of abnormal reaction of the patient, or of later complication, without mention of misadventure at the time of the procedure: Secondary | ICD-10-CM | POA: Insufficient documentation

## 2020-03-08 DIAGNOSIS — N186 End stage renal disease: Secondary | ICD-10-CM | POA: Diagnosis not present

## 2020-03-08 DIAGNOSIS — Z992 Dependence on renal dialysis: Secondary | ICD-10-CM | POA: Diagnosis not present

## 2020-03-08 DIAGNOSIS — Z888 Allergy status to other drugs, medicaments and biological substances status: Secondary | ICD-10-CM | POA: Insufficient documentation

## 2020-03-08 DIAGNOSIS — T82898A Other specified complication of vascular prosthetic devices, implants and grafts, initial encounter: Secondary | ICD-10-CM | POA: Diagnosis not present

## 2020-03-08 HISTORY — PX: PERIPHERAL VASCULAR BALLOON ANGIOPLASTY: CATH118281

## 2020-03-08 HISTORY — PX: A/V FISTULAGRAM: CATH118298

## 2020-03-08 LAB — POCT I-STAT, CHEM 8
BUN: 47 mg/dL — ABNORMAL HIGH (ref 8–23)
Calcium, Ion: 1.09 mmol/L — ABNORMAL LOW (ref 1.15–1.40)
Chloride: 100 mmol/L (ref 98–111)
Creatinine, Ser: 8.5 mg/dL — ABNORMAL HIGH (ref 0.44–1.00)
Glucose, Bld: 118 mg/dL — ABNORMAL HIGH (ref 70–99)
HCT: 36 % (ref 36.0–46.0)
Hemoglobin: 12.2 g/dL (ref 12.0–15.0)
Potassium: 5.1 mmol/L (ref 3.5–5.1)
Sodium: 137 mmol/L (ref 135–145)
TCO2: 30 mmol/L (ref 22–32)

## 2020-03-08 SURGERY — A/V FISTULAGRAM
Anesthesia: LOCAL

## 2020-03-08 MED ORDER — LIDOCAINE HCL (PF) 1 % IJ SOLN
INTRAMUSCULAR | Status: AC
  Filled 2020-03-08: qty 30

## 2020-03-08 MED ORDER — LIDOCAINE HCL (PF) 1 % IJ SOLN
INTRAMUSCULAR | Status: DC | PRN
  Administered 2020-03-08: 3 mL

## 2020-03-08 MED ORDER — HEPARIN SODIUM (PORCINE) 1000 UNIT/ML IJ SOLN
INTRAMUSCULAR | Status: DC | PRN
  Administered 2020-03-08: 3000 [IU] via INTRAVENOUS

## 2020-03-08 MED ORDER — IODIXANOL 320 MG/ML IV SOLN
INTRAVENOUS | Status: DC | PRN
  Administered 2020-03-08: 40 mL

## 2020-03-08 MED ORDER — HEPARIN (PORCINE) IN NACL 1000-0.9 UT/500ML-% IV SOLN
INTRAVENOUS | Status: DC | PRN
  Administered 2020-03-08: 500 mL

## 2020-03-08 MED ORDER — HEPARIN (PORCINE) IN NACL 1000-0.9 UT/500ML-% IV SOLN
INTRAVENOUS | Status: AC
  Filled 2020-03-08: qty 500

## 2020-03-08 MED ORDER — SODIUM CHLORIDE 0.9 % IV SOLN: 250.0000 mL | INTRAVENOUS | Status: DC | PRN

## 2020-03-08 MED ORDER — HEPARIN SODIUM (PORCINE) 1000 UNIT/ML IJ SOLN
INTRAMUSCULAR | Status: AC
  Filled 2020-03-08: qty 1

## 2020-03-08 MED ORDER — SODIUM CHLORIDE 0.9% FLUSH: 3.0000 mL | INTRAVENOUS | Status: DC | PRN

## 2020-03-08 MED ORDER — SODIUM CHLORIDE 0.9% FLUSH: 3.0000 mL | Freq: Two times a day (BID) | INTRAVENOUS | Status: DC

## 2020-03-08 SURGICAL SUPPLY — 16 items
BALLN MUSTANG 10.0X40 75 (BALLOONS) ×3
BALLN MUSTANG 8X60X75 (BALLOONS) ×3
BALLOON MUSTANG 10.0X40 75 (BALLOONS) IMPLANT
BALLOON MUSTANG 8X60X75 (BALLOONS) IMPLANT
CATH ANGIO 5F BER2 65CM (CATHETERS) ×1 IMPLANT
COVER DOME SNAP 22 D (MISCELLANEOUS) ×3 IMPLANT
KIT ENCORE 26 ADVANTAGE (KITS) ×1 IMPLANT
KIT MICROPUNCTURE NIT STIFF (SHEATH) ×2 IMPLANT
PROTECTION STATION PRESSURIZED (MISCELLANEOUS) ×3
SHEATH PINNACLE R/O II 7F 4CM (SHEATH) ×1 IMPLANT
SHEATH PROBE COVER 6X72 (BAG) ×1 IMPLANT
STATION PROTECTION PRESSURIZED (MISCELLANEOUS) ×2 IMPLANT
STOPCOCK MORSE 400PSI 3WAY (MISCELLANEOUS) ×3 IMPLANT
TRAY PV CATH (CUSTOM PROCEDURE TRAY) ×3 IMPLANT
TUBING CIL FLEX 10 FLL-RA (TUBING) ×3 IMPLANT
WIRE BENTSON .035X145CM (WIRE) ×1 IMPLANT

## 2020-03-08 NOTE — Op Note (Signed)
  °  OPERATIVE NOTE   PROCEDURE: 1. left brachiobasilic arteriovenous fistula cannulation under ultrasound guidance 2. left arm fistulogram including central venogram 3. left basilic vein angioplasty in proximal to mid upper arm (8 mm x 60 mm Mustang and 10 mm x 40 mm Mustang)  PRE-OPERATIVE DIAGNOSIS: Malfunctioning left arteriovenous fistula  POST-OPERATIVE DIAGNOSIS: same as above   SURGEON: [name], MD  ANESTHESIA: local  ESTIMATED BLOOD LOSS: 5 cc  FINDING(S): 1. Left arm brachiobasilic fistula with no evidence of central stenosis.  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  There was a high-grade greater than 75% stenosis in the proximal upper arm just proximal to the aneurysm that was angioplastied with a 8 mm Mustang and 10 mm Mustang with less than 30% residual stenosis.  Improved thrill.  SPECIMEN(S):  None  CONTRAST: 40 cc  INDICATIONS: [name] [name] is a 64 y.o. female who presents with malfunctioning left arteriovenous fistula.  The patient is scheduled for left arm fistulogram.  The patient is aware the risks include but are not limited to: bleeding, infection, thrombosis of the cannulated access, and possible anaphylactic reaction to the contrast.  The patient is aware of the risks of the procedure and elects to proceed forward.  DESCRIPTION: After full informed written consent was obtained, the patient was brought back to the angiography suite and placed supine upon the angiography table.  The patient was connected to monitoring equipment.  The left arm was prepped and draped in the standard fashion for a left arm fistulogram.  Under ultrasound guidance, the fistula was evaluated, it was patent, an image was saved.  It was accessed under ultrasound guidance and cannulated with a micropuncture needle.  The microwire was advanced into the fistula and the needle was exchanged for the a microsheath, which was lodged 2 cm into the access.  The wire  was removed and the sheath was connected to the IV extension tubing.  Hand injections were completed to image the access from the antecubitum up to the level of axilla.  The central venous structures were also imaged by hand injections.  Ultimately there was a lesions that were identified as noted above.  I then used a Bentson wire to exchcnage for a short 7 french sheath in the fistula.  I used a Ber 2 catheter with benson wire to cross the proximal upper arm lesion.   Patient was given 3000 units IV heparin.  I initially angioplastied the proximal lesion with an 8 mm x 60 mm Mustang to nominal pressure for 2 minutes.  There was still more than 30% residual stenosis.  I then treated it with a 10 mm x 40 mm Mustang.  There was less than 30% residual stenosis at this time.  4-0 Monocryl pursestring was tied around this and the sheath was removed.  She had excellent thrill that was much improved after completion.  COMPLICATIONS: None  CONDITION: Stable  Plan: Will schedule for plication of left arm aneurysm.  [name], MD [facility] Office: [phone]

## 2020-03-08 NOTE — H&P (Signed)
H&P   MRN #:  [id number]  History of Present Illness: This is a 64 y.o. female with end-stage renal disease that presents with malfunctioning left arm AV fistula.  Patient states she has had prolonged bleeding from the needle holes for at least the last 2 to 3 weeks.  She has a left arm basilic vein fistula that has had multiple secondary interventions.  She is also concerned about a aneurysmal segment in the mid upper arm.  Past Medical History:  Diagnosis Date  . Adenomatous colon polyp 04/1998  . Anemia   . Anxiety   . Arthritis   . Carotid artery disease ([place])    Carotid US 1/18: bilateral ICA 1-39, R thyroid lobe nodule (1.9x2.2x3cm); numerous L thyroid lobe nodules - repeat 1 year  . Chronic kidney disease    M/W/[facility]  . Chronic renal failure    post transplant  . Depression   . Diabetes mellitus    diet controlled  . EBV infection    Per [facility]  . Encephalitis    NMDA per [facility]  . Esophagitis    Grade 1 Distal  . Focal seizure (H[facility])    .  Last one 06/2016  . GERD (gastroesophageal reflux disease)   . Hemorrhoids   . History of blood transfusion   . History of kidney stones    Passed  . History of subdural hematoma   . Hx of cardiovascular stress test    Lexiscan Myoview (06/2013):  No ischemia, EF 66%; normal.  //  Myoview 12/17: EF 62, no ischemia or scar; Normal  . Hx of echocardiogram    a. Echocardiogram (06/2013):  Mod focal basal hypertrophy, EF 60-65%, normal wall motion, Gr 1 DD, mild AI, mildly dilated ascending aorta (41 mm), mild LAE.; b.  Echo 9/16: mod LVH, EF 60-65%, no RWMA, Gr 1 DD, trivial AI, mild dilated ascending aorta, mild LAE  . Hyperkalemia   . Hyperlipidemia   . Hypertension   . Hypomagnesemia   . Left jugular vein thrombosis    Partial resolved per [facility]  . Metabolic acidosis   . Pneumonia   . Right jugular vein thrombosis    resolved from [facility]  . Seizures ([place])     Past Surgical History:  Procedure Laterality Date  . A/V  FISTULAGRAM Left 06/04/2017   Procedure: A/V FISTULAGRAM;  Surgeon: Conrad Culver, MD;  Location: [facility];  Service: Cardiovascular;  Laterality: Left;  . A/V FISTULAGRAM N/A 03/24/2019   Procedure: A/V FISTULAGRAM - left arm;  Surgeon: [name], MD;  Location: [facility];  Service: Cardiovascular;  Laterality: N/A;  . ABDOMINAL HYSTERECTOMY    . AV FISTULA PLACEMENT  07/04/2005   Cimino AV fistula  . AV FISTULA PLACEMENT  08/27/2005  . AV FISTULA PLACEMENT Left 04/29/2017   Procedure: Creation of Left Arm ARTERIOVENOUS BRACHIOCEPHALIC FISTULA;  Surgeon: Elam Dutch, MD;  Location: [facility];  Service: Vascular;  Laterality: Left;  . AV FISTULA PLACEMENT Left 06/16/2017   Procedure: BRACHIO_BASCILIC ARTERIOVENOUS (AV) FISTULA CREATION;  Surgeon: Waynetta Sandy, MD;  Location: [facility];  Service: Vascular;  Laterality: Left;  . AV FISTULA PLACEMENT W/ PTFE  08/27/2005  . BASCILIC VEIN TRANSPOSITION Left 06/16/2017   Procedure: BRACHIOCEPHALIC TRANSPOSITION  LEFT ARM;  Surgeon: Waynetta Sandy, MD;  Location: [facility];  Service: Vascular;  Laterality: Left;  . BASCILIC VEIN TRANSPOSITION Left 09/01/2017   Procedure: BASILIC VEIN TRANSPOSITION SECOND STAGE;  Surgeon: Waynetta Sandy, MD;  Location: [place];  Service: Vascular;  Laterality: Left;  . BRAIN BIOPSY    . CESAREAN SECTION    . COLONOSCOPY W/ POLYPECTOMY    . DG AV DIALYSIS GRAFT DECLOT OR  07/24/2005   AV Gore-Tex graf  . DG AV DIALYSIS GRAFT DECLOT OR  Thrombosis right forearm, loop arteriovenous   Thrombosis right forearm, loop arteriovenous graft  . GASTROSTOMY TUBE PLACEMENT    . KIDNEY TRANSPLANT  2009   Both  . PERIPHERAL VASCULAR BALLOON ANGIOPLASTY Left 03/24/2019   Procedure: PERIPHERAL VASCULAR BALLOON ANGIOPLASTY;  Surgeon: [name], MD;  Location: [facility];  Service: Cardiovascular;  Laterality: Left;  left AV fistula  . REMOVAL OF GASTROSTOMY  TUBE    . REVISON OF ARTERIOVENOUS FISTULA Left 11/23/2018   Procedure: REVISION PLICATION OF ARTERIOVENOUS FISTULA;  Surgeon: Waynetta Sandy, MD;  Location: [place] [facility];  Service: Vascular;  Laterality: Left;  . THROMBECTOMY / ARTERIOVENOUS GRAFT REVISION  10/12/2006  . THROMBECTOMY / ARTERIOVENOUS GRAFT REVISION  10/16/2006    Allergies  Allergen Reactions  . Lisinopril Shortness Of Breath, Swelling and Other (See Comments)    Throat irritation also. Patient takes losartan and tolerates fine.  [name] [Oxaprozin] Hives and Dermatitis    Prior to Admission medications   Medication Sig Start Date End Date Taking? Authorizing Provider  acetaminophen (TYLENOL) 650 MG CR tablet Take 650-1,300 mg by mouth every 8 (eight) hours as needed for pain.    Yes [provider]  albuterol (VENTOLIN HFA) 108 (90 Base) MCG/ACT inhaler INHALE 2 PUFFS INTO THE LUNGS EVERY 4 (FOUR) HOURS AS NEEDED FOR WHEEZING OR SHORTNESS OF BREATH. 11/30/19  Yes Midge Minium, MD  amLODipine (NORVASC) 10 MG tablet TAKE 1 TABLET BY MOUTH EVERY DAY 02/21/20  Yes Midge Minium, MD  atorvastatin (LIPITOR) 40 MG tablet Take 1 tablet (40 mg total) by mouth daily. 07/06/19  Yes Nahser, Wonda Cheng, MD  cetirizine (ZYRTEC) 10 MG tablet TAKE 1 TABLET BY MOUTH EVERY DAY Patient taking differently: Take 10 mg by mouth daily. 07/27/19  Yes Midge Minium, MD  DULoxetine (CYMBALTA) 20 MG capsule TAKE 1 CAPSULE BY MOUTH EVERY DAY 11/21/19  Yes Midge Minium, MD  ferric citrate (AURYXIA) 1 GM 210 MG(Fe) tablet Take 210 mg by mouth See admin instructions. Take 210 mg by mouth three times a day with meals and 210 mg two times a day with snacks   Yes [provider]  hydrALAZINE (APRESOLINE) 100 MG tablet TAKE 1 TABLET BY MOUTH TWICE A DAY 12/12/19  Yes Midge Minium, MD  HYDROcodone-acetaminophen (NORCO/VICODIN) 5-325 MG tablet Take 1 tablet by mouth every 6 (six) hours as needed for moderate pain.  10/21/19  Yes Midge Minium, MD  isosorbide dinitrate (ISORDIL) 20 MG tablet TAKE 1 TABLET BY MOUTH THREE TIMES A DAY Patient taking differently: Take 20 mg by mouth 3 (three) times daily. 02/02/19  Yes Midge Minium, MD  loratadine (CLARITIN) 10 MG tablet Take 10 mg by mouth daily as needed for allergies.   Yes [provider]  multivitamin (RENA-VIT) TABS tablet Take 1 tablet by mouth every morning.  03/20/17  Yes [provider]  omeprazole (PRILOSEC) 40 MG capsule TAKE 1 CAPSULE BY MOUTH EVERY DAY 10/31/19  Yes Midge Minium, MD  pregabalin (LYRICA) 50 MG capsule Take 50 mg by mouth 2 (two) times daily. 11/29/19  Yes [provider]  QUEtiapine (SEROQUEL) 100 MG tablet TAKE 1 TABLET BY MOUTH EVERYDAY AT BEDTIME 01/06/20  Yes Midge Minium, MD  traZODone (DESYREL) 50 MG tablet Take 50 mg by mouth at bedtime as needed for sleep.  07/29/19  Yes [provider]  blood glucose meter kit and supplies KIT Dispense based on patient and insurance preference. Patient should test sugars twice daily as directed. Dx. E11.9 12/10/18   Midge Minium, MD  glucose blood (ONETOUCH ULTRA) test strip Use as instructed to test sugars twice daily. Dx. E11.9 03/29/19   Midge Minium, MD  Methoxy PEG-Epoetin Beta (MIRCERA IJ) Mircera 11/02/19 10/31/20  [provider]  nitroGLYCERIN (NITROSTAT) 0.4 MG SL tablet Place 0.4 mg under the tongue every 5 (five) minutes as needed for chest pain.  06/16/13   [provider]    Social History   Socioeconomic History  . Marital status: Widowed    Spouse name: Not on file  . Number of children: 2  . Years of education: 88  . Highest education level: Not on file  Occupational History  . Occupation: Retired  Tobacco Use  . Smoking status: Former Research scientist (life sciences)  . Smokeless tobacco: Never Used  . Tobacco comment: smoked in teens  Vaping Use  . Vaping Use: Never used  Substance and Sexual Activity  .  Alcohol use: No  . Drug use: No  . Sexual activity: Never  Other Topics Concern  . Not on file  Social History Narrative   Denies caffeine use    Social Determinants of Health   Financial Resource Strain: Low Risk   . Difficulty of Paying Living Expenses: Not hard at all  Food Insecurity: No Food Insecurity  . Worried About Charity fundraiser in the Last Year: Never true  . Ran Out of Food in the Last Year: Never true  Transportation Needs: No Transportation Needs  . Lack of Transportation (Medical): No  . Lack of Transportation (Non-Medical): No  Physical Activity: Inactive  . Days of Exercise per Week: 0 days  . Minutes of Exercise per Session: 0 min  Stress: No Stress Concern Present  . Feeling of Stress : Not at all  Social Connections: Moderately Isolated  . Frequency of Communication with Friends and Family: Once a week  . Frequency of Social Gatherings with Friends and Family: Once a week  . Attends Religious Services: 1 to 4 times per year  . Active Member of Clubs or Organizations: Yes  . Attends Archivist Meetings: 1 to 4 times per year  . Marital Status: Widowed  Intimate Partner Violence: Not At Risk  . Fear of Current or Ex-Partner: No  . Emotionally Abused: No  . Physically Abused: No  . Sexually Abused: No     Family History  Problem Relation Age of Onset  . Kidney disease Paternal Aunt   . Heart disease Mother   . Heart disease Father   . Colon cancer Neg Hx     ROS: _0  Positive   _1  Negative   _2  All sytems reviewed and are negative  Cardiovascular: _3  chest pain/pressure _4  palpitations _5  SOB lying flat _6  DOE _7  pain in legs while walking _8  pain in legs at rest _9  pain in legs at night _10  non-healing ulcers _11  hx of DVT _12  swelling in legs  Pulmonary: _13  productive cough _14  asthma/wheezing _15  home O2  Neurologic: _16  weakness in _17  arms _18  legs _19  numbness in _20  arms _21   legs _0  hx of CVA _1  mini  stroke _2 difficulty speaking or slurred speech _3  temporary loss of vision in one eye _4  dizziness  Hematologic: _5  hx of cancer _6  bleeding problems _7  problems with blood clotting easily  Endocrine:   _8  diabetes _9  thyroid disease  GI _10  vomiting blood _11  blood in stool  GU: _12  CKD/renal failure _13  HD--_14  M/W/F or _15  T/T/S _16  burning with urination _17  blood in urine  Psychiatric: _18  anxiety _19  depression  Musculoskeletal: _20  arthritis _21  joint pain  Integumentary: _22  rashes _23  ulcers  Constitutional: _24  fever _25  chills   Physical Examination  Vitals:   03/08/20 0723  BP: 137/75  Pulse: 82  Resp: 14  Temp: 98.4 F (36.9 C)  SpO2: 94%   Body mass index is 30.18 kg/m.  General:  WDWN in NAD Gait: Not observed HENT: WNL, normocephalic Pulmonary: normal non-labored breathing, without Rales, rhonchi,  wheezing Vascular Exam/Pulses: Left AVF with palpable thrill, aneurysmal segment in mid upper arm - no ulceration or thin skin Musculoskeletal: no muscle wasting or atrophy  Neurologic: A&O X 3; Appropriate Affect ; SENSATION: normal; MOTOR FUNCTION:  moving all extremities equally. Speech is fluent/normal   CBC    Component Value Date/Time   WBC 7.8 09/01/2019 1819   RBC 3.43 (L) 09/01/2019 1819   HGB 12.2 03/08/2020 0813   HCT 36.0 03/08/2020 0813   PLT 222 09/01/2019 1819   MCV 97.7 09/01/2019 1819   MCH 30.3 09/01/2019 1819   MCHC 31.0 09/01/2019 1819   RDW 16.6 (H) 09/01/2019 1819   LYMPHSABS 1.6 09/01/2019 1819   MONOABS 0.5 09/01/2019 1819   EOSABS 0.2 09/01/2019 1819   BASOSABS 0.1 09/01/2019 1819    BMET    Component Value Date/Time   NA 137 03/08/2020 0813   NA 139 07/05/2019 0940   K 5.1 03/08/2020 0813   CL 100 03/08/2020 0813   CO2 34 (H) 10/13/2019 1203   GLUCOSE 118 (H) 03/08/2020 0813   BUN 47 (H) 03/08/2020 0813   BUN 22 07/05/2019 0940   CREATININE 8.50 (H) 03/08/2020 0813   CREATININE 4.46 (H) 03/26/2017 1627    CALCIUM 9.3 10/13/2019 1203   GFRNONAA 6 (L) 09/01/2019 1819   GFRAA 7 (L) 09/01/2019 1819    COAGS: Lab Results  Component Value Date   INR 1.08 06/11/2017   INR 1.0 06/11/2017   INR 1.02 04/29/2017     Non-Invasive Vascular Imaging:    None   ASSESSMENT/PLAN: This is a 64 y.o. female presents with malfunctioning left arm AV fistula.  Discussed plan for left arm fistulogram with possible intervention.  I discussed this will be to to try and improve flow through the fistula and prevent prolonged bleeding afterwards.  Discussed this will not address her aneurysmal segment.  The good news is the aneurysm does not have any open ulcerations that appear to require any urgent intervention and we will likely monitor this as outpatient.  [name], MD [facility] Office: [place]

## 2020-03-08 NOTE — Progress Notes (Signed)
Patient was given discharge instructions. She verbalized understanding. 

## 2020-03-08 NOTE — Discharge Instructions (Signed)

## 2020-03-09 DIAGNOSIS — N186 End stage renal disease: Secondary | ICD-10-CM | POA: Diagnosis not present

## 2020-03-09 DIAGNOSIS — L299 Pruritus, unspecified: Secondary | ICD-10-CM | POA: Diagnosis not present

## 2020-03-09 DIAGNOSIS — R0602 Shortness of breath: Secondary | ICD-10-CM | POA: Diagnosis not present

## 2020-03-09 DIAGNOSIS — N2581 Secondary hyperparathyroidism of renal origin: Secondary | ICD-10-CM | POA: Diagnosis not present

## 2020-03-09 DIAGNOSIS — E1129 Type 2 diabetes mellitus with other diabetic kidney complication: Secondary | ICD-10-CM | POA: Diagnosis not present

## 2020-03-09 DIAGNOSIS — Z992 Dependence on renal dialysis: Secondary | ICD-10-CM | POA: Diagnosis not present

## 2020-03-09 DIAGNOSIS — D509 Iron deficiency anemia, unspecified: Secondary | ICD-10-CM | POA: Diagnosis not present

## 2020-03-09 DIAGNOSIS — D689 Coagulation defect, unspecified: Secondary | ICD-10-CM | POA: Diagnosis not present

## 2020-03-12 DIAGNOSIS — D689 Coagulation defect, unspecified: Secondary | ICD-10-CM | POA: Diagnosis not present

## 2020-03-12 DIAGNOSIS — N2581 Secondary hyperparathyroidism of renal origin: Secondary | ICD-10-CM | POA: Diagnosis not present

## 2020-03-12 DIAGNOSIS — L299 Pruritus, unspecified: Secondary | ICD-10-CM | POA: Diagnosis not present

## 2020-03-12 DIAGNOSIS — N186 End stage renal disease: Secondary | ICD-10-CM | POA: Diagnosis not present

## 2020-03-12 DIAGNOSIS — E1129 Type 2 diabetes mellitus with other diabetic kidney complication: Secondary | ICD-10-CM | POA: Diagnosis not present

## 2020-03-12 DIAGNOSIS — D509 Iron deficiency anemia, unspecified: Secondary | ICD-10-CM | POA: Diagnosis not present

## 2020-03-12 DIAGNOSIS — Z992 Dependence on renal dialysis: Secondary | ICD-10-CM | POA: Diagnosis not present

## 2020-03-14 ENCOUNTER — Other Ambulatory Visit: Payer: Self-pay

## 2020-03-14 DIAGNOSIS — N2581 Secondary hyperparathyroidism of renal origin: Secondary | ICD-10-CM | POA: Diagnosis not present

## 2020-03-14 DIAGNOSIS — L299 Pruritus, unspecified: Secondary | ICD-10-CM | POA: Diagnosis not present

## 2020-03-14 DIAGNOSIS — D509 Iron deficiency anemia, unspecified: Secondary | ICD-10-CM | POA: Diagnosis not present

## 2020-03-14 DIAGNOSIS — Z992 Dependence on renal dialysis: Secondary | ICD-10-CM | POA: Diagnosis not present

## 2020-03-14 DIAGNOSIS — N186 End stage renal disease: Secondary | ICD-10-CM | POA: Diagnosis not present

## 2020-03-14 DIAGNOSIS — D689 Coagulation defect, unspecified: Secondary | ICD-10-CM | POA: Diagnosis not present

## 2020-03-14 DIAGNOSIS — E1129 Type 2 diabetes mellitus with other diabetic kidney complication: Secondary | ICD-10-CM | POA: Diagnosis not present

## 2020-03-16 DIAGNOSIS — D689 Coagulation defect, unspecified: Secondary | ICD-10-CM | POA: Diagnosis not present

## 2020-03-16 DIAGNOSIS — N186 End stage renal disease: Secondary | ICD-10-CM | POA: Diagnosis not present

## 2020-03-16 DIAGNOSIS — N2581 Secondary hyperparathyroidism of renal origin: Secondary | ICD-10-CM | POA: Diagnosis not present

## 2020-03-16 DIAGNOSIS — E1129 Type 2 diabetes mellitus with other diabetic kidney complication: Secondary | ICD-10-CM | POA: Diagnosis not present

## 2020-03-16 DIAGNOSIS — D509 Iron deficiency anemia, unspecified: Secondary | ICD-10-CM | POA: Diagnosis not present

## 2020-03-16 DIAGNOSIS — L299 Pruritus, unspecified: Secondary | ICD-10-CM | POA: Diagnosis not present

## 2020-03-16 DIAGNOSIS — Z992 Dependence on renal dialysis: Secondary | ICD-10-CM | POA: Diagnosis not present

## 2020-03-19 DIAGNOSIS — N2581 Secondary hyperparathyroidism of renal origin: Secondary | ICD-10-CM | POA: Diagnosis not present

## 2020-03-19 DIAGNOSIS — E1129 Type 2 diabetes mellitus with other diabetic kidney complication: Secondary | ICD-10-CM | POA: Diagnosis not present

## 2020-03-19 DIAGNOSIS — D509 Iron deficiency anemia, unspecified: Secondary | ICD-10-CM | POA: Diagnosis not present

## 2020-03-19 DIAGNOSIS — L299 Pruritus, unspecified: Secondary | ICD-10-CM | POA: Diagnosis not present

## 2020-03-19 DIAGNOSIS — N186 End stage renal disease: Secondary | ICD-10-CM | POA: Diagnosis not present

## 2020-03-19 DIAGNOSIS — D689 Coagulation defect, unspecified: Secondary | ICD-10-CM | POA: Diagnosis not present

## 2020-03-19 DIAGNOSIS — Z992 Dependence on renal dialysis: Secondary | ICD-10-CM | POA: Diagnosis not present

## 2020-03-21 DIAGNOSIS — E1129 Type 2 diabetes mellitus with other diabetic kidney complication: Secondary | ICD-10-CM | POA: Diagnosis not present

## 2020-03-21 DIAGNOSIS — Z992 Dependence on renal dialysis: Secondary | ICD-10-CM | POA: Diagnosis not present

## 2020-03-21 DIAGNOSIS — D689 Coagulation defect, unspecified: Secondary | ICD-10-CM | POA: Diagnosis not present

## 2020-03-21 DIAGNOSIS — N186 End stage renal disease: Secondary | ICD-10-CM | POA: Diagnosis not present

## 2020-03-21 DIAGNOSIS — N2581 Secondary hyperparathyroidism of renal origin: Secondary | ICD-10-CM | POA: Diagnosis not present

## 2020-03-21 DIAGNOSIS — L299 Pruritus, unspecified: Secondary | ICD-10-CM | POA: Diagnosis not present

## 2020-03-21 DIAGNOSIS — D509 Iron deficiency anemia, unspecified: Secondary | ICD-10-CM | POA: Diagnosis not present

## 2020-03-22 ENCOUNTER — Other Ambulatory Visit: Payer: Self-pay | Admitting: Family Medicine

## 2020-03-23 DIAGNOSIS — I150 Renovascular hypertension: Secondary | ICD-10-CM | POA: Diagnosis not present

## 2020-03-23 DIAGNOSIS — D509 Iron deficiency anemia, unspecified: Secondary | ICD-10-CM | POA: Diagnosis not present

## 2020-03-23 DIAGNOSIS — D689 Coagulation defect, unspecified: Secondary | ICD-10-CM | POA: Diagnosis not present

## 2020-03-23 DIAGNOSIS — L299 Pruritus, unspecified: Secondary | ICD-10-CM | POA: Diagnosis not present

## 2020-03-23 DIAGNOSIS — N186 End stage renal disease: Secondary | ICD-10-CM | POA: Diagnosis not present

## 2020-03-23 DIAGNOSIS — Z992 Dependence on renal dialysis: Secondary | ICD-10-CM | POA: Diagnosis not present

## 2020-03-23 DIAGNOSIS — N2581 Secondary hyperparathyroidism of renal origin: Secondary | ICD-10-CM | POA: Diagnosis not present

## 2020-03-23 DIAGNOSIS — E1129 Type 2 diabetes mellitus with other diabetic kidney complication: Secondary | ICD-10-CM | POA: Diagnosis not present

## 2020-03-24 DIAGNOSIS — R0602 Shortness of breath: Secondary | ICD-10-CM | POA: Diagnosis not present

## 2020-03-27 DIAGNOSIS — N2581 Secondary hyperparathyroidism of renal origin: Secondary | ICD-10-CM | POA: Diagnosis not present

## 2020-03-27 DIAGNOSIS — Z992 Dependence on renal dialysis: Secondary | ICD-10-CM | POA: Diagnosis not present

## 2020-03-27 DIAGNOSIS — D689 Coagulation defect, unspecified: Secondary | ICD-10-CM | POA: Diagnosis not present

## 2020-03-27 DIAGNOSIS — T8249XD Other complication of vascular dialysis catheter, subsequent encounter: Secondary | ICD-10-CM | POA: Diagnosis not present

## 2020-03-27 DIAGNOSIS — D509 Iron deficiency anemia, unspecified: Secondary | ICD-10-CM | POA: Diagnosis not present

## 2020-03-27 DIAGNOSIS — N186 End stage renal disease: Secondary | ICD-10-CM | POA: Diagnosis not present

## 2020-03-27 DIAGNOSIS — Z1152 Encounter for screening for COVID-19: Secondary | ICD-10-CM | POA: Diagnosis not present

## 2020-03-27 DIAGNOSIS — L299 Pruritus, unspecified: Secondary | ICD-10-CM | POA: Diagnosis not present

## 2020-03-27 DIAGNOSIS — E1129 Type 2 diabetes mellitus with other diabetic kidney complication: Secondary | ICD-10-CM | POA: Diagnosis not present

## 2020-03-30 DIAGNOSIS — D689 Coagulation defect, unspecified: Secondary | ICD-10-CM | POA: Diagnosis not present

## 2020-03-30 DIAGNOSIS — L299 Pruritus, unspecified: Secondary | ICD-10-CM | POA: Diagnosis not present

## 2020-03-30 DIAGNOSIS — E1129 Type 2 diabetes mellitus with other diabetic kidney complication: Secondary | ICD-10-CM | POA: Diagnosis not present

## 2020-03-30 DIAGNOSIS — Z992 Dependence on renal dialysis: Secondary | ICD-10-CM | POA: Diagnosis not present

## 2020-03-30 DIAGNOSIS — D509 Iron deficiency anemia, unspecified: Secondary | ICD-10-CM | POA: Diagnosis not present

## 2020-03-30 DIAGNOSIS — T8249XD Other complication of vascular dialysis catheter, subsequent encounter: Secondary | ICD-10-CM | POA: Diagnosis not present

## 2020-03-30 DIAGNOSIS — N2581 Secondary hyperparathyroidism of renal origin: Secondary | ICD-10-CM | POA: Diagnosis not present

## 2020-03-30 DIAGNOSIS — N186 End stage renal disease: Secondary | ICD-10-CM | POA: Diagnosis not present

## 2020-03-31 DIAGNOSIS — N186 End stage renal disease: Secondary | ICD-10-CM | POA: Diagnosis not present

## 2020-03-31 DIAGNOSIS — D509 Iron deficiency anemia, unspecified: Secondary | ICD-10-CM | POA: Diagnosis not present

## 2020-03-31 DIAGNOSIS — E1129 Type 2 diabetes mellitus with other diabetic kidney complication: Secondary | ICD-10-CM | POA: Diagnosis not present

## 2020-03-31 DIAGNOSIS — L299 Pruritus, unspecified: Secondary | ICD-10-CM | POA: Diagnosis not present

## 2020-03-31 DIAGNOSIS — N2581 Secondary hyperparathyroidism of renal origin: Secondary | ICD-10-CM | POA: Diagnosis not present

## 2020-03-31 DIAGNOSIS — T8249XD Other complication of vascular dialysis catheter, subsequent encounter: Secondary | ICD-10-CM | POA: Diagnosis not present

## 2020-03-31 DIAGNOSIS — D689 Coagulation defect, unspecified: Secondary | ICD-10-CM | POA: Diagnosis not present

## 2020-03-31 DIAGNOSIS — Z992 Dependence on renal dialysis: Secondary | ICD-10-CM | POA: Diagnosis not present

## 2020-04-02 ENCOUNTER — Telehealth: Payer: Self-pay | Admitting: Family Medicine

## 2020-04-02 DIAGNOSIS — L299 Pruritus, unspecified: Secondary | ICD-10-CM | POA: Diagnosis not present

## 2020-04-02 DIAGNOSIS — D689 Coagulation defect, unspecified: Secondary | ICD-10-CM | POA: Diagnosis not present

## 2020-04-02 DIAGNOSIS — Z992 Dependence on renal dialysis: Secondary | ICD-10-CM | POA: Diagnosis not present

## 2020-04-02 DIAGNOSIS — E1129 Type 2 diabetes mellitus with other diabetic kidney complication: Secondary | ICD-10-CM | POA: Diagnosis not present

## 2020-04-02 DIAGNOSIS — N2581 Secondary hyperparathyroidism of renal origin: Secondary | ICD-10-CM | POA: Diagnosis not present

## 2020-04-02 DIAGNOSIS — T8249XD Other complication of vascular dialysis catheter, subsequent encounter: Secondary | ICD-10-CM | POA: Diagnosis not present

## 2020-04-02 DIAGNOSIS — D509 Iron deficiency anemia, unspecified: Secondary | ICD-10-CM | POA: Diagnosis not present

## 2020-04-02 DIAGNOSIS — N186 End stage renal disease: Secondary | ICD-10-CM | POA: Diagnosis not present

## 2020-04-02 NOTE — Progress Notes (Signed)
  Chronic Care Management   Outreach Note  04/02/2020 Name: [name] [name] MRN: [id number] DOB: 06-30-1955  Referred by: Midge Minium, MD Reason for referral : No chief complaint on file.   Third unsuccessful telephone outreach was attempted today. The patient was referred to the pharmacist for assistance with care management and care coordination.   Follow Up Plan:   Lauretta Grill Upstream Scheduler

## 2020-04-03 ENCOUNTER — Other Ambulatory Visit: Payer: Self-pay

## 2020-04-03 ENCOUNTER — Other Ambulatory Visit (HOSPITAL_COMMUNITY)
Admission: RE | Admit: 2020-04-03 | Discharge: 2020-04-03 | Disposition: A | Discharge status: Home / Self Care | Payer: Medicare HMO | Source: Ambulatory Visit | Attending: Vascular Surgery | Admitting: Vascular Surgery

## 2020-04-03 ENCOUNTER — Encounter (HOSPITAL_COMMUNITY): Payer: Self-pay | Admitting: Vascular Surgery

## 2020-04-03 DIAGNOSIS — Z20822 Contact with and (suspected) exposure to covid-19: Secondary | ICD-10-CM | POA: Insufficient documentation

## 2020-04-03 DIAGNOSIS — Z01812 Encounter for preprocedural laboratory examination: Secondary | ICD-10-CM | POA: Diagnosis not present

## 2020-04-03 LAB — SARS CORONAVIRUS 2 (TAT 6-24 HRS): SARS Coronavirus 2: NEGATIVE

## 2020-04-03 NOTE — Progress Notes (Signed)
Mrs. Bruster denies chest pain at this time. Patient has a history of shortness of breath, she started on Oxygen the 1st week of January , 2022.  Mrs. Grosshans was exposed to a Covid positive person, her son, who began to show symptoms on 1 /8/22, patient's son is hospitalized.  Mrs Boom was tested this am and knows to be in quarantine until after surgery.

## 2020-04-03 NOTE — Anesthesia Preprocedure Evaluation (Addendum)
Anesthesia Evaluation  Patient identified by MRN, date of birth, ID band Patient awake    Reviewed: Allergy & Precautions, NPO status , Patient's Chart, lab work & pertinent test results  History of Anesthesia Complications Negative for: history of anesthetic complications  Airway Mallampati: I  TM Distance: >3 FB Neck ROM: Full    Dental  (+) Poor Dentition, Missing, Dental Advisory Given   Pulmonary COPD,  COPD inhaler and oxygen dependent, former smoker,  04/03/2020 SARS coronavirus NEG   breath sounds clear to auscultation       Cardiovascular hypertension, Pt. on medications (-) angina+ Peripheral Vascular Disease   Rhythm:Regular Rate:Normal  '15 ECHO: EF 60-65%. Wall motion was normal, abnormalleft ventricular relaxation (grade 1 diastolic  dysfunction).  - Aortic valve: Mild thickening and calcification,  consistent with sclerosis. Mild regurgitation.  - Aorta: Ascending aortic diameter: 25mm (S),  mildly dilated    Neuro/Psych Anxiety Depression Remote h/o brain infection, subdural hematoma    GI/Hepatic Neg liver ROS, GERD  Medicated and Controlled,  Endo/Other  diabetes (glu 118)  Renal/GU Dialysis and ESRFRenal disease (K+ 4.0)S/p renal transplant     Musculoskeletal  (+) Arthritis ,   Abdominal   Peds  Hematology negative hematology ROS (+)   Anesthesia Other Findings   Reproductive/Obstetrics                           Anesthesia Physical Anesthesia Plan  ASA: III  Anesthesia Plan: General   Post-op Pain Management:    Induction: Intravenous  PONV Risk Score and Plan: 3 and Ondansetron, Dexamethasone and Treatment may vary due to age or medical condition  Airway Management Planned: LMA  Additional Equipment: None  Intra-op Plan:   Post-operative Plan:   Informed Consent: I have reviewed the patients History and Physical, chart, labs and discussed the  procedure including the risks, benefits and alternatives for the proposed anesthesia with the patient or authorized representative who has indicated his/her understanding and acceptance.     Dental advisory given  Plan Discussed with: CRNA and Surgeon  Anesthesia Plan Comments: (PAT note by Karoline Caldwell, PA-C: Follows with pulmonology for chronic respiratory failure with hypoxia.  Recently seen by Wyn Quaker, NP 01/05/2020 and at that time in-office walk test showed supplemental O2 requirement of 2 L with activity (O2 sats dropped to 85% on room air with activity).  Order was placed for patient to have portable oxygen concentrator.  High-resolution CT scan also showed abnormal findings consistent with usual interstitial pneumonia.  Recommend monitoring with follow-up CT scan in 12 months.    ESRD on HD.  Will need day of surgery labs and evaluation.  CT chest 01/17/2020: IMPRESSION: 1. Unusual findings in the lungs which appear mildly progressive compared to prior study from 07/05/2018. Overall, findings are favored to reflect mild post infectious fibrosis, demonstrating predominantly a cryptogenic organizing pneumonia pattern (COP), although the imaging characteristics on today's examination are rather nonspecific. Overall, at this time, this is categorized as most compatible with an alternative diagnosis to usual interstitial pneumonia (UIP) per current ATS guidelines. Repeat high-resolution chest CT is recommended in 12 months to assess for further temporal changes in the appearance of the lung parenchyma. 2. Mild to moderate air trapping indicative of small airways disease. 3. Small pulmonary nodules measuring 5 mm or less in size, nonspecific, but statistically likely benign. Attention at time of repeat high-resolution chest CT is recommended to ensure stability or resolution of  these findings. 4. Aortic atherosclerosis, in addition to left main and 3 vessel coronary artery  disease. Please note that although the presence of coronary artery calcium documents the presence of coronary artery disease, the severity of this disease and any potential stenosis cannot be assessed on this non-gated CT examination. Assessment for potential risk factor modification, dietary therapy or pharmacologic therapy may be warranted, if clinically indicated. 5. There are calcifications of the aortic valve. Echocardiographic correlation for evaluation of potential valvular dysfunction may be warranted if clinically indicated.  Nuclear stress 03/20/2016: Nuclear stress EF: 62%. There was no ST segment deviation noted during stress. The study is normal. The left ventricular ejection fraction is normal (55-65%).   Normal study, no evidence for ischemia or infarction.    TTE 12/12/2014: - Left ventricle: The cavity size was normal. Wall thickness was  increased in a pattern of moderate LVH. Systolic function was  normal. The estimated ejection fraction was in the range of 60%  to 65%. Wall motion was normal; there were no regional wall  motion abnormalities. Doppler parameters are consistent with  abnormal left ventricular relaxation (grade 1 diastolic  dysfunction).  - Aortic valve: There was trivial regurgitation.  - Ascending aorta: The ascending aorta was mildly dilated.  - Left atrium: The atrium was mildly dilated.   Impressions:   - Normal LV systolic function; grade 1 diastolic dysfunction;  moderate LVH; mild LAE; calcified aortic valve with trace AI;  mildly dilated ascending aorta; trace MR and TR.   )      Anesthesia Quick Evaluation

## 2020-04-03 NOTE — Progress Notes (Signed)
Anesthesia Chart Review: Same-day work-up  Follows with pulmonology for chronic respiratory failure with hypoxia.  Recently seen by Wyn Quaker, NP 01/05/2020 and at that time in-office walk test showed supplemental O2 requirement of 2 L with activity (O2 sats dropped to 85% on room air with activity).  Order was placed for patient to have portable oxygen concentrator.  High-resolution CT scan also showed abnormal findings consistent with usual interstitial pneumonia.  Recommend monitoring with follow-up CT scan in 12 months.    ESRD on HD.  Will need day of surgery labs and evaluation.  CT chest 01/17/2020: IMPRESSION: 1. Unusual findings in the lungs which appear mildly progressive compared to prior study from 07/05/2018. Overall, findings are favored to reflect mild post infectious fibrosis, demonstrating predominantly a cryptogenic organizing pneumonia pattern (COP), although the imaging characteristics on today's examination are rather nonspecific. Overall, at this time, this is categorized as most compatible with an alternative diagnosis to usual interstitial pneumonia (UIP) per current ATS guidelines. Repeat high-resolution chest CT is recommended in 12 months to assess for further temporal changes in the appearance of the lung parenchyma. 2. Mild to moderate air trapping indicative of small airways disease. 3. Small pulmonary nodules measuring 5 mm or less in size, nonspecific, but statistically likely benign. Attention at time of repeat high-resolution chest CT is recommended to ensure stability or resolution of these findings. 4. Aortic atherosclerosis, in addition to left main and 3 vessel coronary artery disease. Please note that although the presence of coronary artery calcium documents the presence of coronary artery disease, the severity of this disease and any potential stenosis cannot be assessed on this non-gated CT examination. Assessment for potential risk factor  modification, dietary therapy or pharmacologic therapy may be warranted, if clinically indicated. 5. There are calcifications of the aortic valve. Echocardiographic correlation for evaluation of potential valvular dysfunction may be warranted if clinically indicated.  Nuclear stress 03/20/2016:  Nuclear stress EF: 62%.  There was no ST segment deviation noted during stress.  The study is normal.  The left ventricular ejection fraction is normal (55-65%).   Normal study, no evidence for ischemia or infarction.    TTE 12/12/2014: - Left ventricle: The cavity size was normal. Wall thickness was  increased in a pattern of moderate LVH. Systolic function was  normal. The estimated ejection fraction was in the range of 60%  to 65%. Wall motion was normal; there were no regional wall  motion abnormalities. Doppler parameters are consistent with  abnormal left ventricular relaxation (grade 1 diastolic  dysfunction).  - Aortic valve: There was trivial regurgitation.  - Ascending aorta: The ascending aorta was mildly dilated.  - Left atrium: The atrium was mildly dilated.   Impressions:   - Normal LV systolic function; grade 1 diastolic dysfunction;  moderate LVH; mild LAE; calcified aortic valve with trace AI;  mildly dilated ascending aorta; trace MR and TR.    Wynonia Musty [facility]/Anesthesiology Phone [phone] 04/03/2020 10:15 AM

## 2020-04-04 ENCOUNTER — Encounter (HOSPITAL_COMMUNITY)
Admission: RE | Disposition: A | Discharge status: Home / Self Care | Payer: Self-pay | Source: Home / Self Care | Attending: Vascular Surgery

## 2020-04-04 ENCOUNTER — Ambulatory Visit (HOSPITAL_COMMUNITY)
Admission: RE | Admit: 2020-04-04 | Discharge: 2020-04-04 | Disposition: A | Discharge status: Home / Self Care | Payer: Medicare HMO | Attending: Vascular Surgery | Admitting: Vascular Surgery

## 2020-04-04 ENCOUNTER — Encounter (HOSPITAL_COMMUNITY): Payer: Self-pay | Admitting: Vascular Surgery

## 2020-04-04 ENCOUNTER — Ambulatory Visit (HOSPITAL_COMMUNITY): Payer: Medicare HMO

## 2020-04-04 ENCOUNTER — Ambulatory Visit (HOSPITAL_COMMUNITY): Payer: Medicare HMO | Admitting: Physician Assistant

## 2020-04-04 DIAGNOSIS — Z87891 Personal history of nicotine dependence: Secondary | ICD-10-CM | POA: Insufficient documentation

## 2020-04-04 DIAGNOSIS — Z940 Kidney transplant status: Secondary | ICD-10-CM | POA: Insufficient documentation

## 2020-04-04 DIAGNOSIS — N185 Chronic kidney disease, stage 5: Secondary | ICD-10-CM | POA: Diagnosis not present

## 2020-04-04 DIAGNOSIS — Z79899 Other long term (current) drug therapy: Secondary | ICD-10-CM | POA: Diagnosis not present

## 2020-04-04 DIAGNOSIS — T82898A Other specified complication of vascular prosthetic devices, implants and grafts, initial encounter: Secondary | ICD-10-CM | POA: Insufficient documentation

## 2020-04-04 DIAGNOSIS — N186 End stage renal disease: Secondary | ICD-10-CM | POA: Diagnosis not present

## 2020-04-04 DIAGNOSIS — Z888 Allergy status to other drugs, medicaments and biological substances status: Secondary | ICD-10-CM | POA: Diagnosis not present

## 2020-04-04 DIAGNOSIS — Z95828 Presence of other vascular implants and grafts: Secondary | ICD-10-CM

## 2020-04-04 DIAGNOSIS — Y832 Surgical operation with anastomosis, bypass or graft as the cause of abnormal reaction of the patient, or of later complication, without mention of misadventure at the time of the procedure: Secondary | ICD-10-CM | POA: Diagnosis not present

## 2020-04-04 DIAGNOSIS — I517 Cardiomegaly: Secondary | ICD-10-CM | POA: Diagnosis not present

## 2020-04-04 DIAGNOSIS — E1122 Type 2 diabetes mellitus with diabetic chronic kidney disease: Secondary | ICD-10-CM | POA: Diagnosis not present

## 2020-04-04 DIAGNOSIS — D631 Anemia in chronic kidney disease: Secondary | ICD-10-CM | POA: Diagnosis not present

## 2020-04-04 DIAGNOSIS — L98499 Non-pressure chronic ulcer of skin of other sites with unspecified severity: Secondary | ICD-10-CM | POA: Insufficient documentation

## 2020-04-04 DIAGNOSIS — Z992 Dependence on renal dialysis: Secondary | ICD-10-CM | POA: Diagnosis not present

## 2020-04-04 DIAGNOSIS — I120 Hypertensive chronic kidney disease with stage 5 chronic kidney disease or end stage renal disease: Secondary | ICD-10-CM | POA: Diagnosis not present

## 2020-04-04 HISTORY — DX: Dependence on supplemental oxygen: Z99.81

## 2020-04-04 HISTORY — PX: INSERTION OF DIALYSIS CATHETER: SHX1324

## 2020-04-04 HISTORY — PX: FISTULA SUPERFICIALIZATION: SHX6341

## 2020-04-04 HISTORY — PX: REVISON OF ARTERIOVENOUS FISTULA: SHX6074

## 2020-04-04 LAB — POCT I-STAT, CHEM 8
BUN: 48 mg/dL — ABNORMAL HIGH (ref 8–23)
Calcium, Ion: 1.1 mmol/L — ABNORMAL LOW (ref 1.15–1.40)
Chloride: 101 mmol/L (ref 98–111)
Creatinine, Ser: 11.2 mg/dL — ABNORMAL HIGH (ref 0.44–1.00)
Glucose, Bld: 118 mg/dL — ABNORMAL HIGH (ref 70–99)
HCT: 35 % — ABNORMAL LOW (ref 36.0–46.0)
Hemoglobin: 11.9 g/dL — ABNORMAL LOW (ref 12.0–15.0)
Potassium: 4 mmol/L (ref 3.5–5.1)
Sodium: 140 mmol/L (ref 135–145)
TCO2: 27 mmol/L (ref 22–32)

## 2020-04-04 LAB — GLUCOSE, CAPILLARY
Glucose-Capillary: 124 mg/dL — ABNORMAL HIGH (ref 70–99)
Glucose-Capillary: 111 mg/dL — ABNORMAL HIGH (ref 70–99)

## 2020-04-04 SURGERY — REVISON OF ARTERIOVENOUS FISTULA
Anesthesia: General

## 2020-04-04 MED ORDER — PROPOFOL 10 MG/ML IV BOLUS
INTRAVENOUS | Status: AC
  Filled 2020-04-04: qty 20

## 2020-04-04 MED ORDER — CHLORHEXIDINE GLUCONATE 0.12 % MT SOLN: 15.0000 mL | Freq: Once | OROMUCOSAL | Status: AC

## 2020-04-04 MED ORDER — CHLORHEXIDINE GLUCONATE 0.12 % MT SOLN
OROMUCOSAL | Status: AC
  Administered 2020-04-04: 15 mL via OROMUCOSAL
  Filled 2020-04-04: qty 15

## 2020-04-04 MED ORDER — MIDAZOLAM HCL 2 MG/2ML IJ SOLN
INTRAMUSCULAR | Status: DC | PRN
  Administered 2020-04-04: 1 mg via INTRAVENOUS

## 2020-04-04 MED ORDER — MEPERIDINE HCL 25 MG/ML IJ SOLN: 6.2500 mg | INTRAMUSCULAR | Status: DC | PRN

## 2020-04-04 MED ORDER — FENTANYL CITRATE (PF) 250 MCG/5ML IJ SOLN
INTRAMUSCULAR | Status: DC | PRN
  Administered 2020-04-04: 50 ug via INTRAVENOUS

## 2020-04-04 MED ORDER — HEPARIN SODIUM (PORCINE) 1000 UNIT/ML IJ SOLN
INTRAMUSCULAR | Status: AC
  Filled 2020-04-04: qty 1

## 2020-04-04 MED ORDER — LIDOCAINE 2% (20 MG/ML) 5 ML SYRINGE
INTRAMUSCULAR | Status: DC | PRN
  Administered 2020-04-04: 40 mg via INTRAVENOUS

## 2020-04-04 MED ORDER — CEFAZOLIN SODIUM-DEXTROSE 2-4 GM/100ML-% IV SOLN
2.0000 g | INTRAVENOUS | Status: AC
  Administered 2020-04-04: 2 g via INTRAVENOUS

## 2020-04-04 MED ORDER — ORAL CARE MOUTH RINSE: 15.0000 mL | Freq: Once | OROMUCOSAL | Status: AC

## 2020-04-04 MED ORDER — PHENYLEPHRINE 40 MCG/ML (10ML) SYRINGE FOR IV PUSH (FOR BLOOD PRESSURE SUPPORT)
PREFILLED_SYRINGE | INTRAVENOUS | Status: AC
  Filled 2020-04-04: qty 10

## 2020-04-04 MED ORDER — LIDOCAINE HCL (PF) 1 % IJ SOLN
INTRAMUSCULAR | Status: AC
  Filled 2020-04-04: qty 30

## 2020-04-04 MED ORDER — FENTANYL CITRATE (PF) 250 MCG/5ML IJ SOLN
INTRAMUSCULAR | Status: AC
  Filled 2020-04-04: qty 5

## 2020-04-04 MED ORDER — FENTANYL CITRATE (PF) 100 MCG/2ML IJ SOLN
INTRAMUSCULAR | Status: AC
  Filled 2020-04-04: qty 2

## 2020-04-04 MED ORDER — PROTAMINE SULFATE 10 MG/ML IV SOLN
INTRAVENOUS | Status: DC | PRN
  Administered 2020-04-04: 30 mg via INTRAVENOUS

## 2020-04-04 MED ORDER — CHLORHEXIDINE GLUCONATE 4 % EX LIQD: 60.0000 mL | Freq: Once | CUTANEOUS | Status: DC

## 2020-04-04 MED ORDER — FENTANYL CITRATE (PF) 100 MCG/2ML IJ SOLN: 25.0000 ug | INTRAMUSCULAR | Status: DC | PRN

## 2020-04-04 MED ORDER — HEPARIN SODIUM (PORCINE) 1000 UNIT/ML IJ SOLN
INTRAMUSCULAR | Status: DC | PRN
  Administered 2020-04-04: 2100 [IU] via INTRAVENOUS

## 2020-04-04 MED ORDER — OXYCODONE-ACETAMINOPHEN 7.5-325 MG PO TABS: 1.0000 | ORAL_TABLET | ORAL | 0 refills | Status: DC | PRN

## 2020-04-04 MED ORDER — CEFAZOLIN SODIUM-DEXTROSE 2-4 GM/100ML-% IV SOLN
INTRAVENOUS | Status: AC
  Filled 2020-04-04: qty 100

## 2020-04-04 MED ORDER — SODIUM CHLORIDE 0.9 % IV SOLN
INTRAVENOUS | Status: AC
  Filled 2020-04-04: qty 1.2

## 2020-04-04 MED ORDER — 0.9 % SODIUM CHLORIDE (POUR BTL) OPTIME
TOPICAL | Status: DC | PRN
  Administered 2020-04-04: 1000 mL

## 2020-04-04 MED ORDER — HEPARIN SODIUM (PORCINE) 1000 UNIT/ML IJ SOLN
INTRAMUSCULAR | Status: DC | PRN
Start: 2020-04-04 — End: 2020-04-04
  Administered 2020-04-04: 5000 [IU] via INTRAVENOUS

## 2020-04-04 MED ORDER — PROMETHAZINE HCL 25 MG/ML IJ SOLN: 6.2500 mg | INTRAMUSCULAR | Status: DC | PRN

## 2020-04-04 MED ORDER — MIDAZOLAM HCL 2 MG/2ML IJ SOLN: 0.5000 mg | Freq: Once | INTRAMUSCULAR | Status: DC | PRN

## 2020-04-04 MED ORDER — PHENYLEPHRINE 40 MCG/ML (10ML) SYRINGE FOR IV PUSH (FOR BLOOD PRESSURE SUPPORT)
PREFILLED_SYRINGE | INTRAVENOUS | Status: DC | PRN
  Administered 2020-04-04: 40 ug via INTRAVENOUS
  Administered 2020-04-04: 80 ug via INTRAVENOUS

## 2020-04-04 MED ORDER — ONDANSETRON HCL 4 MG/2ML IJ SOLN
INTRAMUSCULAR | Status: AC
  Filled 2020-04-04: qty 2

## 2020-04-04 MED ORDER — OXYCODONE HCL 5 MG PO TABS: 5.0000 mg | ORAL_TABLET | Freq: Once | ORAL | Status: DC | PRN

## 2020-04-04 MED ORDER — ONDANSETRON HCL 4 MG/2ML IJ SOLN
INTRAMUSCULAR | Status: DC | PRN
  Administered 2020-04-04: 4 mg via INTRAVENOUS

## 2020-04-04 MED ORDER — SODIUM CHLORIDE 0.9 % IV SOLN: INTRAVENOUS | Status: DC

## 2020-04-04 MED ORDER — PROPOFOL 10 MG/ML IV BOLUS
INTRAVENOUS | Status: DC | PRN
Start: 2020-04-04 — End: 2020-04-04
  Administered 2020-04-04: 200 mg via INTRAVENOUS

## 2020-04-04 MED ORDER — DEXAMETHASONE SODIUM PHOSPHATE 10 MG/ML IJ SOLN
INTRAMUSCULAR | Status: AC
  Filled 2020-04-04: qty 1

## 2020-04-04 MED ORDER — LIDOCAINE 2% (20 MG/ML) 5 ML SYRINGE
INTRAMUSCULAR | Status: AC
  Filled 2020-04-04: qty 5

## 2020-04-04 MED ORDER — MIDAZOLAM HCL 2 MG/2ML IJ SOLN
INTRAMUSCULAR | Status: AC
  Filled 2020-04-04: qty 2

## 2020-04-04 MED ORDER — SODIUM CHLORIDE 0.9 % IV SOLN
INTRAVENOUS | Status: DC | PRN
  Administered 2020-04-04: 500 mL

## 2020-04-04 MED ORDER — OXYCODONE HCL 5 MG/5ML PO SOLN: 5.0000 mg | Freq: Once | ORAL | Status: DC | PRN

## 2020-04-04 MED ORDER — DEXAMETHASONE SODIUM PHOSPHATE 10 MG/ML IJ SOLN
INTRAMUSCULAR | Status: DC | PRN
  Administered 2020-04-04: 4 mg via INTRAVENOUS

## 2020-04-04 SURGICAL SUPPLY — 69 items
ADH SKN CLS APL DERMABOND .7 (GAUZE/BANDAGES/DRESSINGS) ×2
AGENT HMST SPONGE THK3/8 (HEMOSTASIS)
ARMBAND PINK RESTRICT EXTREMIT (MISCELLANEOUS) ×3 IMPLANT
BAG DECANTER FOR FLEXI CONT (MISCELLANEOUS) ×3 IMPLANT
BIOPATCH RED 1 DISK 7.0 (GAUZE/BANDAGES/DRESSINGS) ×3 IMPLANT
CANISTER SUCT 3000ML PPV (MISCELLANEOUS) ×3 IMPLANT
CATH PALINDROME-P 19CM W/VT (CATHETERS) IMPLANT
CATH PALINDROME-P 23CM W/VT (CATHETERS) IMPLANT
CATH PALINDROME-P 28CM W/VT (CATHETERS) ×1 IMPLANT
CATH STRAIGHT 5FR 65CM (CATHETERS) IMPLANT
CLIP VESOCCLUDE MED 6/CT (CLIP) ×3 IMPLANT
CLIP VESOCCLUDE SM WIDE 6/CT (CLIP) ×3 IMPLANT
COVER DOME SNAP 22 D (MISCELLANEOUS) ×1 IMPLANT
COVER PROBE W GEL 5X96 (DRAPES) ×3 IMPLANT
COVER SURGICAL LIGHT HANDLE (MISCELLANEOUS) ×3 IMPLANT
COVER WAND RF STERILE (DRAPES) ×2 IMPLANT
DECANTER SPIKE VIAL GLASS SM (MISCELLANEOUS) ×3 IMPLANT
DERMABOND ADVANCED (GAUZE/BANDAGES/DRESSINGS) ×1
DERMABOND ADVANCED .7 DNX12 (GAUZE/BANDAGES/DRESSINGS) ×2 IMPLANT
DRAPE C-ARM 42X72 X-RAY (DRAPES) ×3 IMPLANT
DRAPE CHEST BREAST 15X10 FENES (DRAPES) ×4 IMPLANT
ELECT REM PT RETURN 9FT ADLT (ELECTROSURGICAL) ×3
ELECTRODE REM PT RTRN 9FT ADLT (ELECTROSURGICAL) ×2 IMPLANT
GAUZE 4X4 16PLY RFD (DISPOSABLE) ×3 IMPLANT
GLOVE BIO SURGEON STRL SZ7.5 (GLOVE) ×3 IMPLANT
GLOVE BIOGEL PI IND STRL 8 (GLOVE) ×2 IMPLANT
GLOVE BIOGEL PI INDICATOR 8 (GLOVE) ×1
GOWN STRL REUS W/ TWL LRG LVL3 (GOWN DISPOSABLE) ×4 IMPLANT
GOWN STRL REUS W/ TWL XL LVL3 (GOWN DISPOSABLE) ×4 IMPLANT
GOWN STRL REUS W/TWL LRG LVL3 (GOWN DISPOSABLE) ×6
GOWN STRL REUS W/TWL XL LVL3 (GOWN DISPOSABLE) ×6
HEMOSTAT SPONGE AVITENE ULTRA (HEMOSTASIS) IMPLANT
KIT BASIN OR (CUSTOM PROCEDURE TRAY) ×3 IMPLANT
KIT PALINDROME-P 55CM (CATHETERS) IMPLANT
KIT TURNOVER KIT B (KITS) ×3 IMPLANT
NDL 18GX1X1/2 (RX/OR ONLY) (NEEDLE) ×2 IMPLANT
NDL HYPO 25GX1X1/2 BEV (NEEDLE) ×2 IMPLANT
NDL PERC 18GX7CM (NEEDLE) IMPLANT
NEEDLE 18GX1X1/2 (RX/OR ONLY) (NEEDLE) ×3 IMPLANT
NEEDLE HYPO 25GX1X1/2 BEV (NEEDLE) ×3 IMPLANT
NEEDLE PERC 18GX7CM (NEEDLE) ×3 IMPLANT
NS IRRIG 1000ML POUR BTL (IV SOLUTION) ×3 IMPLANT
PACK CV ACCESS (CUSTOM PROCEDURE TRAY) ×3 IMPLANT
PACK SURGICAL SETUP 50X90 (CUSTOM PROCEDURE TRAY) ×3 IMPLANT
PAD ARMBOARD 7.5X6 YLW CONV (MISCELLANEOUS) ×6 IMPLANT
SET MICROPUNCTURE 5F STIFF (MISCELLANEOUS) ×1 IMPLANT
SHEATH PINNACLE 5F 10CM (SHEATH) ×1 IMPLANT
SOAP 2 % CHG 4 OZ (WOUND CARE) ×3 IMPLANT
STAPLER VISISTAT 35W (STAPLE) IMPLANT
SUT ETHILON 3 0 PS 1 (SUTURE) ×3 IMPLANT
SUT ETHILON 4 0 PS 2 18 (SUTURE) ×1 IMPLANT
SUT MNCRL AB 4-0 PS2 18 (SUTURE) ×4 IMPLANT
SUT PROLENE 5 0 C 1 24 (SUTURE) ×3 IMPLANT
SUT PROLENE 6 0 BV (SUTURE) ×1 IMPLANT
SUT PROLENE 7 0 BV 1 (SUTURE) ×3 IMPLANT
SUT VIC AB 3-0 SH 27 (SUTURE) ×9
SUT VIC AB 3-0 SH 27X BRD (SUTURE) ×4 IMPLANT
SYR 10ML LL (SYRINGE) ×3 IMPLANT
SYR 20ML LL LF (SYRINGE) ×6 IMPLANT
SYR 5ML LL (SYRINGE) ×3 IMPLANT
SYR CONTROL 10ML LL (SYRINGE) ×3 IMPLANT
TOWEL GREEN STERILE (TOWEL DISPOSABLE) ×3 IMPLANT
TOWEL GREEN STERILE FF (TOWEL DISPOSABLE) ×3 IMPLANT
TUBE CONNECTING 20X1/4 (TUBING) ×1 IMPLANT
UNDERPAD 30X36 HEAVY ABSORB (UNDERPADS AND DIAPERS) ×3 IMPLANT
WATER STERILE IRR 1000ML POUR (IV SOLUTION) ×3 IMPLANT
WIRE AMPLATZ SS-J .035X180CM (WIRE) IMPLANT
WIRE BENTSON .035X145CM (WIRE) ×1 IMPLANT
WIRE ROSEN 145CM (WIRE) ×1 IMPLANT

## 2020-04-04 NOTE — H&P (Signed)
History and Physical Interval Note:  04/04/2020 11:06 AM  [name] [name]  has presented today for surgery, with the diagnosis of MALFUNCTIONING AVF.  The various methods of treatment have been discussed with the patient and family. After consideration of risks, benefits and other options for treatment, the patient has consented to  Procedure(s): LEFT ARM ARTERIOVENOUS FISTULA REVISON (Left) PLICATIONS OF ANEURYSM (Left) TUNNELED DIALYSIS CATHETER PLACEMENT (N/A) as a surgical intervention.  The patient's history has been reviewed, patient examined, no change in status, stable for surgery.  I have reviewed the patient's chart and labs.  Questions were answered to the patient's satisfaction.    Left arm AVF revision with plication and TDC.  [name]  H&P   MRN #:  [id number]  History of Present Illness: This is a 64 y.o. female with end-stage renal disease that presents with malfunctioning left arm AV fistula.  Patient states she has had prolonged bleeding from the needle holes for at least the last 2 to 3 weeks.  She has a left arm basilic vein fistula that has had multiple secondary interventions.  She is also concerned about a aneurysmal segment in the mid upper arm.      Past Medical History:  Diagnosis Date  . Adenomatous colon polyp 04/1998  . Anemia   . Anxiety   . Arthritis   . Carotid artery disease ([place])    Carotid US 1/18: bilateral ICA 1-39, R thyroid lobe nodule (1.9x2.2x3cm); numerous L thyroid lobe nodules - repeat 1 year  . Chronic kidney disease    M/W/[facility]  . Chronic renal failure    post transplant  . Depression   . Diabetes mellitus    diet controlled  . EBV infection    Per [facility]  . Encephalitis    NMDA per [facility]  . Esophagitis    Grade 1 Distal  . Focal seizure ([facility])    .  Last one 06/2016  . GERD (gastroesophageal reflux disease)   . Hemorrhoids   . History of blood transfusion   . History of kidney stones     Passed  . History of subdural hematoma   . Hx of cardiovascular stress test    Lexiscan Myoview (06/2013):  No ischemia, EF 66%; normal.  //  Myoview 12/17: EF 62, no ischemia or scar; Normal  . Hx of echocardiogram    a. Echocardiogram (06/2013):  Mod focal basal hypertrophy, EF 60-65%, normal wall motion, Gr 1 DD, mild AI, mildly dilated ascending aorta (41 mm), mild LAE.; b.  Echo 9/16: mod LVH, EF 60-65%, no RWMA, Gr 1 DD, trivial AI, mild dilated ascending aorta, mild LAE  . Hyperkalemia   . Hyperlipidemia   . Hypertension   . Hypomagnesemia   . Left jugular vein thrombosis    Partial resolved per [facility]  . Metabolic acidosis   . Pneumonia   . Right jugular vein thrombosis    resolved from [facility]  . Seizures ([place])          Past Surgical History:  Procedure Laterality Date  . A/V FISTULAGRAM Left 06/04/2017   Procedure: A/V FISTULAGRAM;  Surgeon: Conrad [place], MD;  Location: [place] [facility];  Service: Cardiovascular;  Laterality: Left;  . A/V FISTULAGRAM N/A 03/24/2019   Procedure: A/V FISTULAGRAM - left arm;  Surgeon: [name], MD;  Location: [place] [facility];  Service: Cardiovascular;  Laterality: N/A;  . ABDOMINAL HYSTERECTOMY    . AV FISTULA PLACEMENT  07/04/2005   [place] AV fistula  . AV FISTULA PLACEMENT  08/27/2005  . AV FISTULA PLACEMENT Left 04/29/2017   Procedure: Creation of Left Arm ARTERIOVENOUS BRACHIOCEPHALIC FISTULA;  Surgeon: Elam Dutch, MD;  Location: [place];  Service: Vascular;  Laterality: Left;  . AV FISTULA PLACEMENT Left 06/16/2017   Procedure: BRACHIO_BASCILIC ARTERIOVENOUS (AV) FISTULA CREATION;  Surgeon: Waynetta Sandy, MD;  Location: [place];  Service: Vascular;  Laterality: Left;  . AV FISTULA PLACEMENT W/ PTFE  08/27/2005  . BASCILIC VEIN TRANSPOSITION Left 06/16/2017   Procedure: BRACHIOCEPHALIC TRANSPOSITION  LEFT ARM;  Surgeon: Waynetta Sandy, MD;  Location: [place];  Service:  Vascular;  Laterality: Left;  . BASCILIC VEIN TRANSPOSITION Left 09/01/2017   Procedure: BASILIC VEIN TRANSPOSITION SECOND STAGE;  Surgeon: Waynetta Sandy, MD;  Location: [place];  Service: Vascular;  Laterality: Left;  . BRAIN BIOPSY    . CESAREAN SECTION    . COLONOSCOPY W/ POLYPECTOMY    . DG AV DIALYSIS GRAFT DECLOT OR  07/24/2005   AV [place] graf  . DG AV DIALYSIS GRAFT DECLOT OR  Thrombosis right forearm, loop arteriovenous   Thrombosis right forearm, loop arteriovenous graft  . GASTROSTOMY TUBE PLACEMENT    . KIDNEY TRANSPLANT  2009   Both  . PERIPHERAL VASCULAR BALLOON ANGIOPLASTY Left 03/24/2019   Procedure: PERIPHERAL VASCULAR BALLOON ANGIOPLASTY;  Surgeon: [name], MD;  Location: [place] [facility];  Service: Cardiovascular;  Laterality: Left;  left AV fistula  . REMOVAL OF GASTROSTOMY TUBE    . REVISON OF ARTERIOVENOUS FISTULA Left 11/23/2018   Procedure: REVISION PLICATION OF ARTERIOVENOUS FISTULA;  Surgeon: Waynetta Sandy, MD;  Location: [place];  Service: Vascular;  Laterality: Left;  . THROMBECTOMY / ARTERIOVENOUS GRAFT REVISION  10/12/2006  . THROMBECTOMY / ARTERIOVENOUS GRAFT REVISION  10/16/2006         Allergies  Allergen Reactions  . Lisinopril Shortness Of Breath, Swelling and Other (See Comments)    Throat irritation also. Patient takes losartan and tolerates fine.  [name] [Oxaprozin] Hives and Dermatitis           Prior to Admission medications   Medication Sig Start Date End Date Taking? Authorizing Provider  acetaminophen (TYLENOL) 650 MG CR tablet Take 650-1,300 mg by mouth every 8 (eight) hours as needed for pain.    Yes [provider]  albuterol (VENTOLIN HFA) 108 (90 Base) MCG/ACT inhaler INHALE 2 PUFFS INTO THE LUNGS EVERY 4 (FOUR) HOURS AS NEEDED FOR WHEEZING OR SHORTNESS OF BREATH. 11/30/19  Yes Midge Minium, MD  amLODipine (NORVASC) 10 MG tablet TAKE 1 TABLET BY MOUTH  EVERY DAY 02/21/20  Yes Midge Minium, MD  atorvastatin (LIPITOR) 40 MG tablet Take 1 tablet (40 mg total) by mouth daily. 07/06/19  Yes Nahser, Wonda Cheng, MD  cetirizine (ZYRTEC) 10 MG tablet TAKE 1 TABLET BY MOUTH EVERY DAY Patient taking differently: Take 10 mg by mouth daily. 07/27/19  Yes Midge Minium, MD  DULoxetine (CYMBALTA) 20 MG capsule TAKE 1 CAPSULE BY MOUTH EVERY DAY 11/21/19  Yes Midge Minium, MD  ferric citrate (AURYXIA) 1 GM 210 MG(Fe) tablet Take 210 mg by mouth See admin instructions. Take 210 mg by mouth three times a day with meals and 210 mg two times a day with snacks   Yes [provider]  hydrALAZINE (APRESOLINE) 100 MG tablet TAKE 1 TABLET BY MOUTH TWICE A DAY 12/12/19  Yes Midge Minium, MD  HYDROcodone-acetaminophen (NORCO/VICODIN) 5-325 MG tablet Take 1 tablet by mouth every 6 (six) hours as needed for moderate pain. 10/21/19  Yes Tabori, Katherine E, MD  isosorbide dinitrate (ISORDIL) 20 MG tablet TAKE 1 TABLET BY MOUTH THREE TIMES A DAY Patient taking differently: Take 20 mg by mouth 3 (three) times daily. 02/02/19  Yes Tabori, Katherine E, MD  loratadine (CLARITIN) 10 MG tablet Take 10 mg by mouth daily as needed for allergies.   Yes [provider]  multivitamin (RENA-VIT) TABS tablet Take 1 tablet by mouth every morning.  03/20/17  Yes [provider]  omeprazole (PRILOSEC) 40 MG capsule TAKE 1 CAPSULE BY MOUTH EVERY DAY 10/31/19  Yes Tabori, Katherine E, MD  pregabalin (LYRICA) 50 MG capsule Take 50 mg by mouth 2 (two) times daily. 11/29/19  Yes [provider]  QUEtiapine (SEROQUEL) 100 MG tablet TAKE 1 TABLET BY MOUTH EVERYDAY AT BEDTIME 01/06/20  Yes Tabori, Katherine E, MD  traZODone (DESYREL) 50 MG tablet Take 50 mg by mouth at bedtime as needed for sleep.  07/29/19  Yes [provider]  blood glucose meter kit and supplies KIT Dispense based on patient and insurance preference. Patient  should test sugars twice daily as directed. Dx. E11.9 12/10/18   Tabori, Katherine E, MD  glucose blood (ONETOUCH ULTRA) test strip Use as instructed to test sugars twice daily. Dx. E11.9 03/29/19   Tabori, Katherine E, MD  Methoxy PEG-Epoetin Beta (MIRCERA IJ) Mircera 11/02/19 10/31/20  [provider]  nitroGLYCERIN (NITROSTAT) 0.4 MG SL tablet Place 0.4 mg under the tongue every 5 (five) minutes as needed for chest pain.  06/16/13   [provider]    Social History        Socioeconomic History  . Marital status: Widowed    Spouse name: Not on file  . Number of children: 2  . Years of education: 12  . Highest education level: Not on file  Occupational History  . Occupation: Retired  Tobacco Use  . Smoking status: Former Smoker  . Smokeless tobacco: Never Used  . Tobacco comment: smoked in teens  Vaping Use  . Vaping Use: Never used  Substance and Sexual Activity  . Alcohol use: No  . Drug use: No  . Sexual activity: Never  Other Topics Concern  . Not on file  Social History Narrative   Denies caffeine use    Social Determinants of Health      Financial Resource Strain: Low Risk   . Difficulty of Paying Living Expenses: Not hard at all  Food Insecurity: No Food Insecurity  . Worried About Running Out of Food in the Last Year: Never true  . Ran Out of Food in the Last Year: Never true  Transportation Needs: No Transportation Needs  . Lack of Transportation (Medical): No  . Lack of Transportation (Non-Medical): No  Physical Activity: Inactive  . Days of Exercise per Week: 0 days  . Minutes of Exercise per Session: 0 min  Stress: No Stress Concern Present  . Feeling of Stress : Not at all  Social Connections: Moderately Isolated  . Frequency of Communication with Friends and Family: Once a week  . Frequency of Social Gatherings with Friends and Family: Once a week  . Attends Religious Services: 1 to 4 times per year  . Active Member of  Clubs or Organizations: Yes  . Attends Club or Organization Meetings: 1 to 4 times per year  . Marital Status: Widowed  Intimate Partner   Violence: Not At Risk  . Fear of Current or Ex-Partner: No  . Emotionally Abused: No  . Physically Abused: No  . Sexually Abused: No          Family History  Problem Relation Age of Onset  . Kidney disease Paternal Aunt   . Heart disease Mother   . Heart disease Father   . Colon cancer Neg Hx     ROS: [x] Positive   [ ] Negative   [ ] All sytems reviewed and are negative  Cardiovascular: [] chest pain/pressure [] palpitations [] SOB lying flat [] DOE [] pain in legs while walking [] pain in legs at rest [] pain in legs at night [] non-healing ulcers [] hx of DVT [] swelling in legs  Pulmonary: [] productive cough [] asthma/wheezing [] home O2  Neurologic: [] weakness in [] arms [] legs [] numbness in [] arms [] legs [] hx of CVA [] mini stroke []difficulty speaking or slurred speech [] temporary loss of vision in one eye [] dizziness  Hematologic: [] hx of cancer [] bleeding problems [] problems with blood clotting easily  Endocrine:   [] diabetes [] thyroid disease  GI [] vomiting blood [] blood in stool  GU: [] CKD/renal failure [] HD--[] M/W/F or [] T/T/S [] burning with urination [] blood in urine  Psychiatric: [] anxiety [] depression  Musculoskeletal: [] arthritis [] joint pain  Integumentary: [] rashes [] ulcers  Constitutional: [] fever [] chills   Physical Examination     Vitals:   03/08/20 0723  BP: 137/75  Pulse: 82  Resp: 14  Temp: 98.4 F (36.9 C)  SpO2: 94%   Body mass index is 30.18 kg/m.  General:  WDWN in NAD Gait: Not observed HENT: WNL, normocephalic Pulmonary: normal non-labored breathing, without Rales, rhonchi,  wheezing Vascular Exam/Pulses: Left AVF with palpable thrill, aneurysmal segment in mid upper arm - no ulceration or thin  skin Musculoskeletal: no muscle wasting or atrophy       Neurologic: A&O X 3; Appropriate Affect ; SENSATION: normal; MOTOR FUNCTION:  moving all extremities equally. Speech is fluent/normal   CBC Labs (Brief)          Component Value Date/Time   WBC 7.8 09/01/2019 1819   RBC 3.43 (L) 09/01/2019 1819   HGB 12.2 03/08/2020 0813   HCT 36.0 03/08/2020 0813   PLT 222 09/01/2019 1819   MCV 97.7 09/01/2019 1819   MCH 30.3 09/01/2019 1819   MCHC 31.0 09/01/2019 1819   RDW 16.6 (H) 09/01/2019 1819   LYMPHSABS 1.6 09/01/2019 1819   MONOABS 0.5 09/01/2019 1819   EOSABS 0.2 09/01/2019 1819   BASOSABS 0.1 09/01/2019 1819      BMET Labs (Brief)          Component Value Date/Time   NA 137 03/08/2020 0813   NA 139 07/05/2019 0940   K 5.1 03/08/2020 0813   CL 100 03/08/2020 0813   CO2 34 (H) 10/13/2019 1203   GLUCOSE 118 (H) 03/08/2020 0813   BUN 47 (H) 03/08/2020 0813   BUN 22 07/05/2019 0940   CREATININE 8.50 (H) 03/08/2020 0813   CREATININE 4.46 (H) 03/26/2017 1627   CALCIUM 9.3 10/13/2019 1203   GFRNONAA 6 (L) 09/01/2019 1819   GFRAA 7 (L) 09/01/2019 1819      COAGS: Recent Labs       Lab Results  Component Value Date   INR 1.08 06/11/2017   INR 1.0   06/11/2017   INR 1.02 04/29/2017       Non-Invasive Vascular Imaging:    None   ASSESSMENT/PLAN: This is a 64 y.o. female presents with malfunctioning left arm AV fistula.  Discussed plan for left arm fistulogram with possible intervention.  I discussed this will be to to try and improve flow through the fistula and prevent prolonged bleeding afterwards.  Discussed this will not address her aneurysmal segment.  The good news is the aneurysm does not have any open ulcerations that appear to require any urgent intervention and we will likely monitor this as outpatient.   J. , MD [facility] Office: [phone]  

## 2020-04-04 NOTE — Op Note (Signed)
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  Ultrasound-guided access of left internal jugular vein 2.  Placement of left internal jugular vein tunneled dialysis catheter (27 cm palindrome) 3.  Left arm AV fistula revision with aneurysm plication and ulcer resection  Surgeon: Dr. [name], MD  Assistant: OR staff  Indication: Patient is a 65 year old female with end-stage renal disease who was recently seen with aneurysmal left arm basilic vein fistula and has had multiple secondary interventions in the Cath Lab to maintain patency.  She feels the ulcerated segment has been getting worse and presents today for tunneled dialysis catheter placement and fistula plication after risks and benefits discussed.  Findings: Right IJ appeared occluded and ultimately had to access the left internal jugular vein for placement of a 27 cm palindrome catheter with the tip in the right atrium.  The left arm AV fistula aneurysm was plicated and the ulcerated skin resected.  The fistula had an excellent thrill at completion.  Anesthesia: General  Details: Patient was taken to the operating room after informed consent was obtained.  Placed on the operative table supine position.  Initially the neck was prepped and draped in the usual sterile fashion.  I initially evaluated the right internal jugular vein where there were multiple collaterals and this appeared occluded centrally.  I did attempt to access the right external jugular vein but could not get a wire to advance.  I then accessed the left internal jugular vein after evaluating it with ultrasound it was patent and image was saved.  Ultimately was able to get a wire down the left IJ into the superior vena cava.  In order to exchange for a stiffer wire I then put a 5 French sheath in the left IJ over the wire and then exchanged for our J-wire for  more support.  I measured a 27 cm Palindrome catheter on the chest and made a counterincision on the chest wall and then tunneled from the chest wall to the IJ stick site with the catheter.  I then sequentially dilated over the wire and placed a large dilator peel-away sheath into the right atrium.  The inner dilator was removed and the catheter was placed with the tip into the right atrium and the sheath peeled away.  Unfortunately catheter was kinked at the IJ insertion site and I attempted to pull the catheter back but ultimately lost purchase in the right atrium and the catheter flipped out into the innominate vein.  I then had to cut down on the catheter where it inserted into the IJ and ultimately placed a Rosen wire in  the catheter for additional support and I was able to thread the Rosen wire through the catheter down into the right atrium and then once I was able to pull some catheter through the chest wall had additional purchase to push the catheter down into the right atrium successfully.  The catheter flushed and aspirated easily.  It was in good posiition.  I then secured it to the chest wall with multiple 2-0 nylon's and I closed the IJ stick site where I had done a small cutdown with 4-0 Monocryl running subcuticular.  Catheter was loaded with heparinized saline and sterile dressings applied.  Left arm was then prepped and draped in usual sterile fashion.  I evaluated this fistula and marked out the ulcerated skin that needed to be excised.  Ultimately  incision was made with a 15 blade scalpel over the fistula and used Bovie cautery to dissect down and ultimately the fistula was circumferentially mobilized where it was aneurysmal in the mid upper arm and ultimately I got to healthy fistula proximally distally for control.  Once I had the aneurysm completely mobilized patient was given 5000 units of IV heparin..  I marked the anterior wall of the fistula to maintain proper orientation.  Fistula  clamps were used proximal distal to the aneurysm.  Once I clamped it I then cut the aneurysm wall away on the side of the aneurysm with Metzenbaum scissors in order to leave the anterior wall healthy.  The fistula was then primarily reapproximated with a 5-0 Prolene running suture and I did de-air everything prior to completion.  Ultimately we came off clamps with good hemostasis.  The wound was washed out.  3-0 Vicryl's were used to reapproximate some of the subcutaneous tissue and the wound was closed with 4-0 Monocryl running stitch.  Dermabond was applied.  She seemed to tolerate the procedure and went to the recovery in stable condition.  Complication: None  Condition: Stable  [name], MD [facility] Office: [place]

## 2020-04-04 NOTE — Transfer of Care (Signed)
Immediate Anesthesia Transfer of Care Note  Patient: [name] [name]  Procedure(s) Performed: LEFT ARM ARTERIOVENOUS FISTULA REVISON (Left ) PLICATIONS OF ANEURYSM (Left ) TUNNELED DIALYSIS CATHETER PLACEMENT (N/A )  Patient Location: PACU  Anesthesia Type:General  Level of Consciousness: awake, oriented, patient cooperative and responds to stimulation  Airway & Oxygen Therapy: Patient Spontanous Breathing and Patient connected to nasal cannula oxygen  Post-op Assessment: Report given to RN, Post -op Vital signs reviewed and stable and Patient moving all extremities X 4  Post vital signs: Reviewed and stable  Last Vitals:  Vitals Value Taken Time  BP 142/83 04/04/20 1434  Temp    Pulse 84 04/04/20 1444  Resp 22 04/04/20 1444  SpO2 94 % 04/04/20 1444  Vitals shown include unvalidated device data.  Last Pain:  Vitals:   04/04/20 0931  TempSrc:   PainSc: 0-No pain      Patients Stated Pain Goal: 3 (92/49/32 4199)  Complications: No complications documented.

## 2020-04-04 NOTE — Anesthesia Postprocedure Evaluation (Signed)
Anesthesia Post Note  Patient: DELOYCE WALTHERS  Procedure(s) Performed: LEFT ARM ARTERIOVENOUS FISTULA REVISON (Left ) PLICATIONS OF ANEURYSM (Left ) TUNNELED DIALYSIS CATHETER PLACEMENT (N/A )     Patient location during evaluation: PACU Anesthesia Type: General Level of consciousness: awake and alert, patient cooperative and oriented Pain management: pain level controlled Vital Signs Assessment: post-procedure vital signs reviewed and stable Respiratory status: spontaneous breathing, nonlabored ventilation, respiratory function stable and patient connected to nasal cannula oxygen Cardiovascular status: blood pressure returned to baseline and stable Postop Assessment: no apparent nausea or vomiting Anesthetic complications: no   No complications documented.  Last Vitals:  Vitals:   04/04/20 1435 04/04/20 1520  BP: (!) 142/83 128/76  Pulse: 86 76  Resp: (!) 26 20  Temp: (!) 36.3 C 36.7 C  SpO2: (!) 89% 96%    Last Pain:  Vitals:   04/04/20 1520  TempSrc:   PainSc: Asleep                 Lucile Didonato,E. Jailani Hogans

## 2020-04-05 ENCOUNTER — Encounter (HOSPITAL_COMMUNITY): Payer: Self-pay | Admitting: Vascular Surgery

## 2020-04-05 DIAGNOSIS — E1129 Type 2 diabetes mellitus with other diabetic kidney complication: Secondary | ICD-10-CM | POA: Diagnosis not present

## 2020-04-05 DIAGNOSIS — T8249XD Other complication of vascular dialysis catheter, subsequent encounter: Secondary | ICD-10-CM | POA: Diagnosis not present

## 2020-04-05 DIAGNOSIS — N2581 Secondary hyperparathyroidism of renal origin: Secondary | ICD-10-CM | POA: Diagnosis not present

## 2020-04-05 DIAGNOSIS — D689 Coagulation defect, unspecified: Secondary | ICD-10-CM | POA: Diagnosis not present

## 2020-04-05 DIAGNOSIS — Z992 Dependence on renal dialysis: Secondary | ICD-10-CM | POA: Diagnosis not present

## 2020-04-05 DIAGNOSIS — N186 End stage renal disease: Secondary | ICD-10-CM | POA: Diagnosis not present

## 2020-04-05 DIAGNOSIS — L299 Pruritus, unspecified: Secondary | ICD-10-CM | POA: Diagnosis not present

## 2020-04-05 DIAGNOSIS — D509 Iron deficiency anemia, unspecified: Secondary | ICD-10-CM | POA: Diagnosis not present

## 2020-04-06 DIAGNOSIS — D509 Iron deficiency anemia, unspecified: Secondary | ICD-10-CM | POA: Diagnosis not present

## 2020-04-06 DIAGNOSIS — T8249XD Other complication of vascular dialysis catheter, subsequent encounter: Secondary | ICD-10-CM | POA: Diagnosis not present

## 2020-04-06 DIAGNOSIS — E1129 Type 2 diabetes mellitus with other diabetic kidney complication: Secondary | ICD-10-CM | POA: Diagnosis not present

## 2020-04-06 DIAGNOSIS — L299 Pruritus, unspecified: Secondary | ICD-10-CM | POA: Diagnosis not present

## 2020-04-06 DIAGNOSIS — Z992 Dependence on renal dialysis: Secondary | ICD-10-CM | POA: Diagnosis not present

## 2020-04-06 DIAGNOSIS — N2581 Secondary hyperparathyroidism of renal origin: Secondary | ICD-10-CM | POA: Diagnosis not present

## 2020-04-06 DIAGNOSIS — N186 End stage renal disease: Secondary | ICD-10-CM | POA: Diagnosis not present

## 2020-04-06 DIAGNOSIS — D689 Coagulation defect, unspecified: Secondary | ICD-10-CM | POA: Diagnosis not present

## 2020-04-09 DIAGNOSIS — T8249XD Other complication of vascular dialysis catheter, subsequent encounter: Secondary | ICD-10-CM | POA: Diagnosis not present

## 2020-04-09 DIAGNOSIS — L299 Pruritus, unspecified: Secondary | ICD-10-CM | POA: Diagnosis not present

## 2020-04-09 DIAGNOSIS — R0602 Shortness of breath: Secondary | ICD-10-CM | POA: Diagnosis not present

## 2020-04-09 DIAGNOSIS — D509 Iron deficiency anemia, unspecified: Secondary | ICD-10-CM | POA: Diagnosis not present

## 2020-04-09 DIAGNOSIS — N186 End stage renal disease: Secondary | ICD-10-CM | POA: Diagnosis not present

## 2020-04-09 DIAGNOSIS — D689 Coagulation defect, unspecified: Secondary | ICD-10-CM | POA: Diagnosis not present

## 2020-04-09 DIAGNOSIS — Z992 Dependence on renal dialysis: Secondary | ICD-10-CM | POA: Diagnosis not present

## 2020-04-09 DIAGNOSIS — E1129 Type 2 diabetes mellitus with other diabetic kidney complication: Secondary | ICD-10-CM | POA: Diagnosis not present

## 2020-04-09 DIAGNOSIS — N2581 Secondary hyperparathyroidism of renal origin: Secondary | ICD-10-CM | POA: Diagnosis not present

## 2020-04-11 DIAGNOSIS — Z992 Dependence on renal dialysis: Secondary | ICD-10-CM | POA: Diagnosis not present

## 2020-04-11 DIAGNOSIS — L299 Pruritus, unspecified: Secondary | ICD-10-CM | POA: Diagnosis not present

## 2020-04-11 DIAGNOSIS — T8249XD Other complication of vascular dialysis catheter, subsequent encounter: Secondary | ICD-10-CM | POA: Diagnosis not present

## 2020-04-11 DIAGNOSIS — N2581 Secondary hyperparathyroidism of renal origin: Secondary | ICD-10-CM | POA: Diagnosis not present

## 2020-04-11 DIAGNOSIS — D689 Coagulation defect, unspecified: Secondary | ICD-10-CM | POA: Diagnosis not present

## 2020-04-11 DIAGNOSIS — N186 End stage renal disease: Secondary | ICD-10-CM | POA: Diagnosis not present

## 2020-04-11 DIAGNOSIS — E1129 Type 2 diabetes mellitus with other diabetic kidney complication: Secondary | ICD-10-CM | POA: Diagnosis not present

## 2020-04-11 DIAGNOSIS — D509 Iron deficiency anemia, unspecified: Secondary | ICD-10-CM | POA: Diagnosis not present

## 2020-04-12 ENCOUNTER — Encounter: Payer: Self-pay | Admitting: Family Medicine

## 2020-04-12 ENCOUNTER — Telehealth (INDEPENDENT_AMBULATORY_CARE_PROVIDER_SITE_OTHER): Payer: Medicare HMO | Admitting: Family Medicine

## 2020-04-12 DIAGNOSIS — Z1211 Encounter for screening for malignant neoplasm of colon: Secondary | ICD-10-CM | POA: Diagnosis not present

## 2020-04-12 DIAGNOSIS — E1122 Type 2 diabetes mellitus with diabetic chronic kidney disease: Secondary | ICD-10-CM

## 2020-04-12 DIAGNOSIS — I10 Essential (primary) hypertension: Secondary | ICD-10-CM

## 2020-04-12 DIAGNOSIS — E7849 Other hyperlipidemia: Secondary | ICD-10-CM | POA: Diagnosis not present

## 2020-04-12 DIAGNOSIS — N186 End stage renal disease: Secondary | ICD-10-CM | POA: Diagnosis not present

## 2020-04-12 DIAGNOSIS — Z992 Dependence on renal dialysis: Secondary | ICD-10-CM | POA: Diagnosis not present

## 2020-04-12 NOTE — Progress Notes (Signed)
I connected with  [name] [name] on 04/12/20 by a video enabled telemedicine application and verified that I am speaking with the correct person using two identifiers.   I discussed the limitations of evaluation and management by telemedicine. The patient expressed understanding and agreed to proceed.

## 2020-04-12 NOTE — Progress Notes (Signed)
Virtual Visit via Video   I connected with patient on 04/12/20 at 10:30 AM EST by a video enabled telemedicine application and verified that I am speaking with the correct person using two identifiers.  Location patient: Home Location provider: [facility], Office Persons participating in the virtual visit: Patient, Provider, [address] (Sabrina M)  I discussed the limitations of evaluation and management by telemedicine and the availability of in person appointments. The patient expressed understanding and agreed to proceed.  Subjective:   HPI:   DM- chronic problem.  Currently diet controlled.  Due for eye exam- missed appt, plans to reschedule.  UTD on foot exam.  No need for microalbumin due to HD.  Denies symptomatic lows.  No numbness/tingling of hands/feet.  HTN- chronic problem.  On Amlodipine 19m daily, Hydralazine 1027mBID.  No CP, SOB, HAs, visual changes, edema.  Hyperlipidemia- chronic problem, on Lipitor 4045maily.  No abd pain, N/V.   ROS:   See pertinent positives and negatives per HPI.  Patient Active Problem List   Diagnosis Date Noted  . Shortness of breath 01/05/2020  . Cough 01/05/2020  . Abnormal findings on diagnostic imaging of lung 01/05/2020  . Chronic respiratory failure with hypoxia ([place]/14/2021  . Peripheral neuropathy 09/22/2019  . Tumor 07/28/2019  . History of seizures 07/28/2019  . Tremor 07/28/2019  . Stress incontinence 12/24/2017  . Cough variant asthma 12/24/2017  . Tremor of left hand 12/24/2017  . History of internal jugular thrombosis 03/26/2017  . 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 Anemia 07/08/2016  . Abnormal brain MRI 07/03/2016  . Fasciculation 06/18/2016  . Carotid artery disease ([place] . Encounter for screening for COVID-19 01/28/2016  . Hyperlipidemia 06/28/2015  . Diabetes mellitus type II, controlled ([place]CC[place]4/08/2015  . Thoracic ascending aortic aneurysm (41 mm on  Echo 06/2013) 07/06/2013  . ESRD on dialysis ([place]CC[place]3/26/2015  . History of renal transplant 06/16/2013  . GERD (gastroesophageal reflux disease) 08/13/2009  . OTH&UNSPEC NONINFECTIOUS GASTROENTERITIS&COLITIS 08/13/2009  . Essential hypertension 04/25/2009  . Hemorrhoids 04/25/2009  . WEIGHT LOSS 04/25/2009  . NAUSEA AND VOMITING 04/25/2009  . PERSONAL HX COLONIC POLYPS 04/25/2009    Social History   Tobacco Use  . Smoking status: Former [employer] scientist (life sciences) Smokeless tobacco: Never Used  . Tobacco comment: smoked in teens  Substance Use Topics  . Alcohol use: No    Current Outpatient Medications:  .  acetaminophen (TYLENOL) 650 MG CR tablet, Take 650-1,300 mg by mouth every 8 (eight) hours as needed for pain. , Disp: , Rfl:  .  albuterol (VENTOLIN HFA) 108 (90 Base) MCG/ACT inhaler, INHALE 2 PUFFS INTO THE LUNGS EVERY 4 (FOUR) HOURS AS NEEDED FOR WHEEZING OR SHORTNESS OF BREATH., Disp: 18 g, Rfl: 3 .  amLODipine (NORVASC) 10 MG tablet, TAKE 1 TABLET BY MOUTH EVERY DAY, Disp: 90 tablet, Rfl: 1 .  atorvastatin (LIPITOR) 40 MG tablet, Take 1 tablet (40 mg total) by mouth daily., Disp: 90 tablet, Rfl: 3 .  blood glucose meter kit and supplies KIT, Dispense based on patient and insurance preference. Patient should test sugars twice daily as directed. Dx. E11.9, Disp: 1 each, Rfl: 0 .  cetirizine (ZYRTEC) 10 MG tablet, TAKE 1 TABLET BY MOUTH EVERY DAY (Patient taking differently: Take 10 mg by mouth daily.), Disp: 90 tablet, Rfl: 3 .  DULoxetine (CYMBALTA) 20 MG capsule, TAKE 1 CAPSULE BY MOUTH EVERY DAY, Disp: 90 capsule, Rfl: 2 .  ferric citrate (AURYXIA) 1  GM 210 MG(Fe) tablet, Take 210 mg by mouth See admin instructions. Take 210 mg by mouth three times a day with meals and 210 mg two times a day with snacks, Disp: , Rfl:  .  glucose blood (ONETOUCH ULTRA) test strip, Use as instructed to test sugars twice daily. Dx. E11.9, Disp: 100 each, Rfl: 12 .  hydrALAZINE (APRESOLINE) 100 MG tablet, TAKE 1  TABLET BY MOUTH TWICE A DAY, Disp: 180 tablet, Rfl: 1 .  isosorbide dinitrate (ISORDIL) 20 MG tablet, TAKE 1 TABLET BY MOUTH THREE TIMES A DAY, Disp: 270 tablet, Rfl: 2 .  loratadine (CLARITIN) 10 MG tablet, Take 10 mg by mouth daily as needed for allergies., Disp: , Rfl:  .  Methoxy PEG-Epoetin Beta (MIRCERA IJ), Mircera, Disp: , Rfl:  .  multivitamin (RENA-VIT) TABS tablet, Take 1 tablet by mouth every morning. , Disp: , Rfl: 11 .  nitroGLYCERIN (NITROSTAT) 0.4 MG SL tablet, Place 0.4 mg under the tongue every 5 (five) minutes as needed for chest pain. , Disp: , Rfl:  .  omeprazole (PRILOSEC) 40 MG capsule, TAKE 1 CAPSULE BY MOUTH EVERY DAY, Disp: 90 capsule, Rfl: 1 .  oxyCODONE-acetaminophen (PERCOCET) 7.5-325 MG tablet, Take 1 tablet by mouth every 4 (four) hours as needed for severe pain., Disp: 20 tablet, Rfl: 0 .  pregabalin (LYRICA) 50 MG capsule, Take 50 mg by mouth 2 (two) times daily., Disp: , Rfl:  .  QUEtiapine (SEROQUEL) 100 MG tablet, TAKE 1 TABLET BY MOUTH EVERYDAY AT BEDTIME, Disp: 90 tablet, Rfl: 1 .  traZODone (DESYREL) 50 MG tablet, Take 50 mg by mouth at bedtime as needed for sleep. , Disp: , Rfl:   Allergies  Allergen Reactions  . Lisinopril Shortness Of Breath, Swelling and Other (See Comments)    Throat irritation also. Patient takes losartan and tolerates fine.  [name] [Oxaprozin] Hives and Dermatitis    Objective:   There were no vitals taken for this visit. AAOx3, NAD NCAT, EOMI No obvious CN deficits Coloring WNL Pt is able to speak clearly, coherently without shortness of breath or increased work of breathing.  Thought process is linear.  Mood is appropriate.   Assessment and Plan:   DM- chronic problem.  Pt has recently been diet controlled.  UTD on foot exam.  Due for eye exam- pt to reschedule.  No need for microalbumin due to HD.  Pt is currently asymptomatic.  Check labs and determine if medication is needed.  Pt expressed understanding and is in  agreement w/ plan.   HTN- chronic problem.  Currently on Amlodipine 53m daily and Hydralazine 1094mBID.  Pt was not able to take BP today but has this checked regularly at HD.  Asymptomatic at this time.  Will follow.  Hyperlipidemia- chronic problem. On Lipitor 4068maily w/o difficulty.  Check labs.  Adjust meds prn    KatAnnye AsaD 04/12/2020

## 2020-04-13 DIAGNOSIS — Z992 Dependence on renal dialysis: Secondary | ICD-10-CM | POA: Diagnosis not present

## 2020-04-13 DIAGNOSIS — N2581 Secondary hyperparathyroidism of renal origin: Secondary | ICD-10-CM | POA: Diagnosis not present

## 2020-04-13 DIAGNOSIS — T8249XD Other complication of vascular dialysis catheter, subsequent encounter: Secondary | ICD-10-CM | POA: Diagnosis not present

## 2020-04-13 DIAGNOSIS — L299 Pruritus, unspecified: Secondary | ICD-10-CM | POA: Diagnosis not present

## 2020-04-13 DIAGNOSIS — N186 End stage renal disease: Secondary | ICD-10-CM | POA: Diagnosis not present

## 2020-04-13 DIAGNOSIS — T829XXA Unspecified complication of cardiac and vascular prosthetic device, implant and graft, initial encounter: Secondary | ICD-10-CM | POA: Insufficient documentation

## 2020-04-13 DIAGNOSIS — D509 Iron deficiency anemia, unspecified: Secondary | ICD-10-CM | POA: Diagnosis not present

## 2020-04-13 DIAGNOSIS — D689 Coagulation defect, unspecified: Secondary | ICD-10-CM | POA: Diagnosis not present

## 2020-04-13 DIAGNOSIS — E1129 Type 2 diabetes mellitus with other diabetic kidney complication: Secondary | ICD-10-CM | POA: Diagnosis not present

## 2020-04-16 DIAGNOSIS — D509 Iron deficiency anemia, unspecified: Secondary | ICD-10-CM | POA: Diagnosis not present

## 2020-04-16 DIAGNOSIS — N2581 Secondary hyperparathyroidism of renal origin: Secondary | ICD-10-CM | POA: Diagnosis not present

## 2020-04-16 DIAGNOSIS — D689 Coagulation defect, unspecified: Secondary | ICD-10-CM | POA: Diagnosis not present

## 2020-04-16 DIAGNOSIS — E1129 Type 2 diabetes mellitus with other diabetic kidney complication: Secondary | ICD-10-CM | POA: Diagnosis not present

## 2020-04-16 DIAGNOSIS — L299 Pruritus, unspecified: Secondary | ICD-10-CM | POA: Diagnosis not present

## 2020-04-16 DIAGNOSIS — Z992 Dependence on renal dialysis: Secondary | ICD-10-CM | POA: Diagnosis not present

## 2020-04-16 DIAGNOSIS — N186 End stage renal disease: Secondary | ICD-10-CM | POA: Diagnosis not present

## 2020-04-16 DIAGNOSIS — T8249XD Other complication of vascular dialysis catheter, subsequent encounter: Secondary | ICD-10-CM | POA: Diagnosis not present

## 2020-04-18 ENCOUNTER — Emergency Department (HOSPITAL_COMMUNITY): Payer: Medicare HMO

## 2020-04-18 ENCOUNTER — Other Ambulatory Visit: Payer: Self-pay

## 2020-04-18 ENCOUNTER — Encounter (HOSPITAL_COMMUNITY): Payer: Self-pay | Admitting: Emergency Medicine

## 2020-04-18 ENCOUNTER — Emergency Department (HOSPITAL_COMMUNITY)
Admission: EM | Admit: 2020-04-18 | Discharge: 2020-04-18 | Disposition: A | Discharge status: Home / Self Care | Payer: Medicare HMO | Attending: Emergency Medicine | Admitting: Emergency Medicine

## 2020-04-18 DIAGNOSIS — Z940 Kidney transplant status: Secondary | ICD-10-CM | POA: Insufficient documentation

## 2020-04-18 DIAGNOSIS — S92344A Nondisplaced fracture of fourth metatarsal bone, right foot, initial encounter for closed fracture: Secondary | ICD-10-CM | POA: Insufficient documentation

## 2020-04-18 DIAGNOSIS — L299 Pruritus, unspecified: Secondary | ICD-10-CM | POA: Diagnosis not present

## 2020-04-18 DIAGNOSIS — J45991 Cough variant asthma: Secondary | ICD-10-CM | POA: Insufficient documentation

## 2020-04-18 DIAGNOSIS — N186 End stage renal disease: Secondary | ICD-10-CM | POA: Insufficient documentation

## 2020-04-18 DIAGNOSIS — I120 Hypertensive chronic kidney disease with stage 5 chronic kidney disease or end stage renal disease: Secondary | ICD-10-CM | POA: Diagnosis not present

## 2020-04-18 DIAGNOSIS — I10 Essential (primary) hypertension: Secondary | ICD-10-CM | POA: Diagnosis not present

## 2020-04-18 DIAGNOSIS — S99921A Unspecified injury of right foot, initial encounter: Secondary | ICD-10-CM | POA: Diagnosis not present

## 2020-04-18 DIAGNOSIS — Z87891 Personal history of nicotine dependence: Secondary | ICD-10-CM | POA: Diagnosis not present

## 2020-04-18 DIAGNOSIS — N2581 Secondary hyperparathyroidism of renal origin: Secondary | ICD-10-CM | POA: Diagnosis not present

## 2020-04-18 DIAGNOSIS — D509 Iron deficiency anemia, unspecified: Secondary | ICD-10-CM | POA: Diagnosis not present

## 2020-04-18 DIAGNOSIS — T8249XD Other complication of vascular dialysis catheter, subsequent encounter: Secondary | ICD-10-CM | POA: Diagnosis not present

## 2020-04-18 DIAGNOSIS — Z79899 Other long term (current) drug therapy: Secondary | ICD-10-CM | POA: Insufficient documentation

## 2020-04-18 DIAGNOSIS — S92901A Unspecified fracture of right foot, initial encounter for closed fracture: Secondary | ICD-10-CM | POA: Diagnosis not present

## 2020-04-18 DIAGNOSIS — W1830XA Fall on same level, unspecified, initial encounter: Secondary | ICD-10-CM | POA: Insufficient documentation

## 2020-04-18 DIAGNOSIS — E1129 Type 2 diabetes mellitus with other diabetic kidney complication: Secondary | ICD-10-CM | POA: Diagnosis not present

## 2020-04-18 DIAGNOSIS — D689 Coagulation defect, unspecified: Secondary | ICD-10-CM | POA: Diagnosis not present

## 2020-04-18 DIAGNOSIS — M25561 Pain in right knee: Secondary | ICD-10-CM | POA: Insufficient documentation

## 2020-04-18 DIAGNOSIS — E1122 Type 2 diabetes mellitus with diabetic chronic kidney disease: Secondary | ICD-10-CM | POA: Insufficient documentation

## 2020-04-18 DIAGNOSIS — J90 Pleural effusion, not elsewhere classified: Secondary | ICD-10-CM | POA: Diagnosis not present

## 2020-04-18 DIAGNOSIS — I2510 Atherosclerotic heart disease of native coronary artery without angina pectoris: Secondary | ICD-10-CM | POA: Insufficient documentation

## 2020-04-18 DIAGNOSIS — Z992 Dependence on renal dialysis: Secondary | ICD-10-CM | POA: Insufficient documentation

## 2020-04-18 DIAGNOSIS — R0602 Shortness of breath: Secondary | ICD-10-CM | POA: Diagnosis not present

## 2020-04-18 LAB — BASIC METABOLIC PANEL
Anion gap: 14 (ref 5–15)
BUN: 16 mg/dL (ref 8–23)
CO2: 30 mmol/L (ref 22–32)
Calcium: 8.4 mg/dL — ABNORMAL LOW (ref 8.9–10.3)
Chloride: 95 mmol/L — ABNORMAL LOW (ref 98–111)
Creatinine, Ser: 6.27 mg/dL — ABNORMAL HIGH (ref 0.44–1.00)
GFR, Estimated: 7 mL/min — ABNORMAL LOW (ref >60)
Glucose, Bld: 134 mg/dL — ABNORMAL HIGH (ref 70–99)
Potassium: 4.1 mmol/L (ref 3.5–5.1)
Sodium: 139 mmol/L (ref 135–145)

## 2020-04-18 LAB — CBC
HCT: 33.1 % — ABNORMAL LOW (ref 36.0–46.0)
Hemoglobin: 10.8 g/dL — ABNORMAL LOW (ref 12.0–15.0)
MCH: 33.1 pg (ref 26.0–34.0)
MCHC: 32.6 g/dL (ref 30.0–36.0)
MCV: 101.5 fL — ABNORMAL HIGH (ref 80.0–100.0)
Platelets: 169 10*3/uL (ref 150–400)
RBC: 3.26 MIL/uL — ABNORMAL LOW (ref 3.87–5.11)
RDW: 15 % (ref 11.5–15.5)
WBC: 9.6 10*3/uL (ref 4.0–10.5)
nRBC: 0 % (ref 0.0–0.2)

## 2020-04-18 MED ORDER — ACETAMINOPHEN 325 MG PO TABS
650.0000 mg | ORAL_TABLET | Freq: Once | ORAL | Status: AC
  Administered 2020-04-18: 650 mg via ORAL
  Filled 2020-04-18: qty 2

## 2020-04-18 NOTE — Progress Notes (Signed)
Orthopedic Tech Progress Note Patient Details:  DONDA FRIEDLI [birth year] [id number]  Ortho Devices Type of Ortho Device: Crutches,CAM walker Ortho Device/Splint Location: RLE Ortho Device/Splint Interventions: Ordered,Application,Adjustment   Post Interventions Patient Tolerated: Well,Ambulated well Instructions Provided: Poper ambulation with device,Care of device   Janit Pagan 04/18/2020, 7:05 PM

## 2020-04-18 NOTE — ED Provider Notes (Signed)
[facility]ERG[facility] DEPARTMENT Provider Note   CSN: [id number] Arrival date & time: 04/18/20  1600     History Chief Complaint  Patient presents with  . Fall    [name] [name] is a 65 y.o. female.  Patient presents chief complaint of right foot right knee pain.  She states that she was lightheaded and fell after dialysis today.  She is able to complete dialysis.  Fell onto her right knee complaining of right knee and foot pain.  She states that she gets short of breath at times and uses oxygen at home.  Denies headache or chest pain or back pain or neck pain.  Denies loss of consciousness or head injury.        Past Medical History:  Diagnosis Date  . Adenomatous colon polyp 04/1998  . Anemia   . Anxiety   . Arthritis    HIp  . Carotid artery disease ([place])    Carotid US 1/18: bilateral ICA 1-39, R thyroid lobe nodule (1.9x2.2x3cm); numerous L thyroid lobe nodules - repeat 1 year  . Chronic kidney disease    M/W/F [place] [address]  . Chronic renal failure    post transplant  . Depression   . Diabetes mellitus    diet controlled  . EBV infection    Per [facility]  . Encephalitis    NMDA per [facility]  . Esophagitis    Grade 1 Distal  . Focal seizure (HCC)    .  Last one 06/2016  . GERD (gastroesophageal reflux disease)   . Hemorrhoids   . History of blood transfusion   . History of kidney stones    Passed  . History of subdural hematoma   . Hx of cardiovascular stress test    Lexiscan Myoview (06/2013):  No ischemia, EF 66%; normal.  //  Myoview 12/17: EF 62, no ischemia or scar; Normal  . Hx of echocardiogram    a. Echocardiogram (06/2013):  Mod focal basal hypertrophy, EF 60-65%, normal wall motion, Gr 1 DD, mild AI, mildly dilated ascending aorta (41 mm), mild LAE.; b.  Echo 9/16: mod LVH, EF 60-65%, no RWMA, Gr 1 DD, trivial AI, mild dilated ascending aorta, mild LAE  . Hyperkalemia   . Hyperlipidemia   . Hypertension   . Hypomagnesemia   . Left  jugular vein thrombosis    Partial resolved per [facility]  . Metabolic acidosis   . On home oxygen therapy    2.5 liters  . Pneumonia   . Right jugular vein thrombosis    resolved from [facility]  . Seizures [facility])     Patient Active Problem List   Diagnosis Date Noted  . Shortness of breath 01/05/2020  . Cough 01/05/2020  . Abnormal findings on diagnostic imaging of lung 01/05/2020  . Chronic respiratory failure with hypoxia ([place]) 01/05/2020  . Peripheral neuropathy 09/22/2019  . Tumor 07/28/2019  . History of seizures 07/28/2019  . Tremor 07/28/2019  . Stress incontinence 12/24/2017  . Cough variant asthma 12/24/2017  . Tremor of left hand 12/24/2017  . History of internal jugular thrombosis 03/26/2017  . 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 Anemia 07/08/2016  . Abnormal brain MRI 07/03/2016  . Fasciculation 06/18/2016  . Carotid artery disease ([place])   . Encounter for screening for COVID-19 01/28/2016  . Hyperlipidemia 06/28/2015  . Diabetes mellitus type II, controlled ([place]) 06/28/2015  . Thoracic ascending aortic aneurysm (41 mm on Echo 06/2013) 07/06/2013  . ESRD on dialysis (  [place]) 06/16/2013  . History of renal transplant 06/16/2013  . GERD (gastroesophageal reflux disease) 08/13/2009  . OTH&UNSPEC NONINFECTIOUS GASTROENTERITIS&COLITIS 08/13/2009  . Essential hypertension 04/25/2009  . Hemorrhoids 04/25/2009  . WEIGHT LOSS 04/25/2009  . NAUSEA AND VOMITING 04/25/2009  . PERSONAL HX COLONIC POLYPS 04/25/2009    Past Surgical History:  Procedure Laterality Date  . A/V FISTULAGRAM Left 06/04/2017   Procedure: A/V FISTULAGRAM;  Surgeon: Conrad Stapleton, MD;  Location: [place] [facility] LAB;  Service: Cardiovascular;  Laterality: Left;  . A/V FISTULAGRAM N/A 03/24/2019   Procedure: A/V FISTULAGRAM - left arm;  Surgeon: [name], MD;  Location: [place] CV LAB;  Service: Cardiovascular;  Laterality: N/A;  . A/V FISTULAGRAM N/A  03/08/2020   Procedure: A/V FISTULAGRAM - Left arm;  Surgeon: [name], MD;  Location: [place] [facility] LAB;  Service: Cardiovascular;  Laterality: N/A;  . ABDOMINAL HYSTERECTOMY    . AV FISTULA PLACEMENT  07/04/2005   [place] AV fistula  . AV FISTULA PLACEMENT  08/27/2005  . AV FISTULA PLACEMENT Left 04/29/2017   Procedure: Creation of Left Arm ARTERIOVENOUS BRACHIOCEPHALIC FISTULA;  Surgeon: Elam Dutch, MD;  Location: [facility];  Service: Vascular;  Laterality: Left;  . AV FISTULA PLACEMENT Left 06/16/2017   Procedure: BRACHIO_BASCILIC ARTERIOVENOUS (AV) FISTULA CREATION;  Surgeon: Waynetta Sandy, MD;  Location: [place];  Service: Vascular;  Laterality: Left;  . AV FISTULA PLACEMENT W/ PTFE  08/27/2005  . BASCILIC VEIN TRANSPOSITION Left 06/16/2017   Procedure: BRACHIOCEPHALIC TRANSPOSITION  LEFT ARM;  Surgeon: Waynetta Sandy, MD;  Location: [place];  Service: Vascular;  Laterality: Left;  . BASCILIC VEIN TRANSPOSITION Left 09/01/2017   Procedure: BASILIC VEIN TRANSPOSITION SECOND STAGE;  Surgeon: Waynetta Sandy, MD;  Location: [place];  Service: Vascular;  Laterality: Left;  . BRAIN BIOPSY    . CESAREAN SECTION    . COLONOSCOPY W/ POLYPECTOMY    . DG AV DIALYSIS GRAFT DECLOT OR  07/24/2005   AV Gore-Tex graf  . DG AV DIALYSIS GRAFT DECLOT OR  Thrombosis right forearm, loop arteriovenous   Thrombosis right forearm, loop arteriovenous graft  . FISTULA SUPERFICIALIZATION Left 0/11/3816   Procedure: PLICATIONS OF ANEURYSM;  Surgeon: [name], MD;  Location: [place];  Service: Vascular;  Laterality: Left;  [name] GASTROSTOMY TUBE PLACEMENT    . INSERTION OF DIALYSIS CATHETER N/A 04/04/2020   Procedure: TUNNELED DIALYSIS CATHETER PLACEMENT;  Surgeon: [name], MD;  Location: [place];  Service: Vascular;  Laterality: N/A;  . KIDNEY TRANSPLANT  2009   Both  . PERIPHERAL VASCULAR BALLOON ANGIOPLASTY Left 03/24/2019   Procedure: PERIPHERAL  VASCULAR BALLOON ANGIOPLASTY;  Surgeon: [name], MD;  Location: [place] CV LAB;  Service: Cardiovascular;  Laterality: Left;  left AV fistula  . PERIPHERAL VASCULAR BALLOON ANGIOPLASTY  03/08/2020   Procedure: PERIPHERAL VASCULAR BALLOON ANGIOPLASTY;  Surgeon: [name], MD;  Location: [place] CV LAB;  Service: Cardiovascular;;  . REMOVAL OF GASTROSTOMY TUBE    . REVISON OF ARTERIOVENOUS FISTULA Left 11/23/2018   Procedure: REVISION PLICATION OF ARTERIOVENOUS FISTULA;  Surgeon: Waynetta Sandy, MD;  Location: [place];  Service: Vascular;  Laterality: Left;  . REVISON OF ARTERIOVENOUS FISTULA Left 04/04/2020   Procedure: LEFT ARM ARTERIOVENOUS FISTULA REVISON;  Surgeon: [name], MD;  Location: [place];  Service: Vascular;  Laterality: Left;  . THROMBECTOMY / ARTERIOVENOUS GRAFT REVISION  10/12/2006  . THROMBECTOMY / ARTERIOVENOUS GRAFT REVISION  10/16/2006     OB History   No obstetric history on file.     Family History  Problem Relation Age of Onset  . Kidney disease Paternal Aunt   . Heart disease Mother   . Heart disease Father   . Colon cancer Neg Hx     Social History   Tobacco Use  . Smoking status: Former Research scientist (life sciences)  . Smokeless tobacco: Never Used  . Tobacco comment: smoked in teens  Vaping Use  . Vaping Use: Never used  Substance Use Topics  . Alcohol use: No  . Drug use: No    Home Medications Prior to Admission medications   Medication Sig Start Date End Date Taking? Authorizing Provider  acetaminophen (TYLENOL) 650 MG CR tablet Take 650-1,300 mg by mouth every 8 (eight) hours as needed for pain.     [provider]  albuterol (VENTOLIN HFA) 108 (90 Base) MCG/ACT inhaler INHALE 2 PUFFS INTO THE LUNGS EVERY 4 (FOUR) HOURS AS NEEDED FOR WHEEZING OR SHORTNESS OF BREATH. 11/30/19   Midge Minium, MD  amLODipine (NORVASC) 10 MG tablet TAKE 1 TABLET BY MOUTH EVERY DAY 02/21/20   Midge Minium, MD   atorvastatin (LIPITOR) 40 MG tablet Take 1 tablet (40 mg total) by mouth daily. 07/06/19   Nahser, Wonda Cheng, MD  blood glucose meter kit and supplies KIT Dispense based on patient and insurance preference. Patient should test sugars twice daily as directed. Dx. E11.9 12/10/18   Midge Minium, MD  cetirizine (ZYRTEC) 10 MG tablet TAKE 1 TABLET BY MOUTH EVERY DAY Patient taking differently: Take 10 mg by mouth daily. 07/27/19   Midge Minium, MD  DULoxetine (CYMBALTA) 20 MG capsule TAKE 1 CAPSULE BY MOUTH EVERY DAY 11/21/19   Midge Minium, MD  ferric citrate (AURYXIA) 1 GM 210 MG(Fe) tablet Take 210 mg by mouth See admin instructions. Take 210 mg by mouth three times a day with meals and 210 mg two times a day with snacks    [provider]  glucose blood (ONETOUCH ULTRA) test strip Use as instructed to test sugars twice daily. Dx. E11.9 03/29/19   Midge Minium, MD  hydrALAZINE (APRESOLINE) 100 MG tablet TAKE 1 TABLET BY MOUTH TWICE A DAY 12/12/19   Midge Minium, MD  isosorbide dinitrate (ISORDIL) 20 MG tablet TAKE 1 TABLET BY MOUTH THREE TIMES A DAY 03/22/20   Midge Minium, MD  loratadine (CLARITIN) 10 MG tablet Take 10 mg by mouth daily as needed for allergies.    [provider]  Methoxy PEG-Epoetin Beta (MIRCERA IJ) Mircera 11/02/19 10/31/20  [provider]  multivitamin (RENA-VIT) TABS tablet Take 1 tablet by mouth every morning.  03/20/17   [provider]  nitroGLYCERIN (NITROSTAT) 0.4 MG SL tablet Place 0.4 mg under the tongue every 5 (five) minutes as needed for chest pain.  06/16/13   [provider]  omeprazole (PRILOSEC) 40 MG capsule TAKE 1 CAPSULE BY MOUTH EVERY DAY 10/31/19   Midge Minium, MD  oxyCODONE-acetaminophen (PERCOCET) 7.5-325 MG tablet Take 1 tablet by mouth every 4 (four) hours as needed for severe pain. 04/04/20 04/04/21  Setzer, Edman Circle, PA-C  pregabalin (LYRICA) 50 MG capsule Take 50 mg by mouth  2 (two) times daily. 11/29/19   [provider]  QUEtiapine (SEROQUEL) 100 MG tablet TAKE 1 TABLET BY MOUTH EVERYDAY AT BEDTIME 01/06/20   Midge Minium, MD  traZODone (DESYREL) 50 MG tablet Take 50  mg by mouth at bedtime as needed for sleep.  07/29/19   [provider]    Allergies    Lisinopril and Daypro [oxaprozin]  Review of Systems   Review of Systems  Constitutional: Negative for fever.  HENT: Negative for ear pain.   Eyes: Negative for pain.  Respiratory: Negative for cough.   Cardiovascular: Negative for chest pain.  Gastrointestinal: Negative for abdominal pain.  Genitourinary: Negative for flank pain.  Musculoskeletal: Negative for back pain.  Skin: Negative for rash.  Neurological: Negative for headaches.    Physical Exam Updated Vital Signs BP (!) 147/93 (BP Location: Right Arm)   Pulse 75   Temp 98.3 F (36.8 C) (Oral)   Resp 16   SpO2 100%   Physical Exam Constitutional:      General: She is not in acute distress.    Appearance: Normal appearance.  HENT:     Head: Normocephalic.     Nose: Nose normal.  Eyes:     Extraocular Movements: Extraocular movements intact.  Cardiovascular:     Rate and Rhythm: Normal rate.  Pulmonary:     Effort: Pulmonary effort is normal.  Musculoskeletal:     Cervical back: Normal range of motion.     Comments: Normal range of motion bilateral knees and bilateral ankles.  Tenderness palpation the right midfoot region.  Neurovascular intact otherwise.  Neurological:     General: No focal deficit present.     Mental Status: She is alert. Mental status is at baseline.     ED Results / Procedures / Treatments   Labs (all labs ordered are listed, but only abnormal results are displayed) Labs Reviewed  BASIC METABOLIC PANEL - Abnormal; Notable for the following components:      Result Value   Chloride 95 (*)    Glucose, Bld 134 (*)    Creatinine, Ser 6.27 (*)    Calcium 8.4 (*)    GFR, Estimated 7  (*)    All other components within normal limits  CBC - Abnormal; Notable for the following components:   RBC 3.26 (*)    Hemoglobin 10.8 (*)    HCT 33.1 (*)    MCV 101.5 (*)    All other components within normal limits  URINALYSIS, ROUTINE W REFLEX MICROSCOPIC    EKG EKG Interpretation  Date/Time:  Wednesday April 18 2020 16:11:32 EST Ventricular Rate:  83 PR Interval:  134 QRS Duration: 88 QT Interval:  420 QTC Calculation: 493 R Axis:   -38 Text Interpretation: Normal sinus rhythm Left axis deviation Nonspecific T wave abnormality Prolonged QT Abnormal ECG Confirmed by Thamas Jaegers ([id number]) on 04/18/2020 5:34:01 PM   Radiology DG Chest 1 View  Result Date: 04/18/2020 CLINICAL DATA:  Short of breath, right lower extremity pain, multiple falls EXAM: CHEST  1 VIEW COMPARISON:  04/04/2020 FINDINGS: Single frontal view of the chest demonstrates stable dialysis catheter. Cardiac silhouette remains enlarged. There is chronic central vascular congestion, with slight increase in interstitial prominence since prior study likely representing early edema. Trace left effusion. No pneumothorax. IMPRESSION: 1. Chronic central vascular congestion, with increased interstitial prominence likely representing early interstitial edema. Electronically Signed   By: Randa Ngo M.D.   On: 04/18/2020 18:21   DG Knee 2 Views Right  Result Date: 04/18/2020 CLINICAL DATA:  Right knee pain multiple fall EXAM: RIGHT KNEE - 1-2 VIEW COMPARISON:  03/30/2019 FINDINGS: No acute displaced fracture or malalignment. No significant knee effusion. Vascular calcifications. Mild medial and  lateral joint space degenerative change IMPRESSION: No acute osseous abnormality. Electronically Signed   By: Donavan Foil M.D.   On: 04/18/2020 18:22   DG Foot Complete Right  Result Date: 04/18/2020 CLINICAL DATA:  Shortness of breath right foot pain EXAM: RIGHT FOOT COMPLETE - 3+ VIEW COMPARISON:  02/10/2019 FINDINGS: Bones  appear demineralized. Acute nondisplaced fracture involving the neck of the fourth metatarsal. No subluxation. Plantar calcaneal spur IMPRESSION: Acute nondisplaced fracture involving the neck of the fourth metatarsal. Electronically Signed   By: Donavan Foil M.D.   On: 04/18/2020 18:20    Procedures Procedures   Medications Ordered in ED Medications  acetaminophen (TYLENOL) tablet 650 mg (650 mg Oral Given 04/18/20 1912)    ED Course  I have reviewed the triage vital signs and the nursing notes.  Pertinent labs & imaging results that were available during my care of the patient were reviewed by me and considered in my medical decision making (see chart for details).    MDM Rules/Calculators/A&P                          X-rays show right midfoot tarsal fracture.  Patient placed in the surgical boot of the right foot.  Given crutch training.  Advised follow-up with orthopedic surgery within the week.  Advised immediate return for worsening symptoms or any additional concerns.  Advising nonweightbearing of the right lower extremity until seen by orthopedics.  Final Clinical Impression(s) / ED Diagnoses Final diagnoses:  Closed fracture of right foot, initial encounter    Rx / DC Orders ED Discharge Orders    None       Luna Fuse, MD 04/18/20 1919

## 2020-04-18 NOTE — ED Notes (Signed)
DC reviewed with pt. Pt verbalized understanding.  PT DC to go home with son.

## 2020-04-18 NOTE — ED Triage Notes (Signed)
Pt BIB [facility] from dialysis, pt reports multiple falls today. Per EMS, pt c/o dizziness on standing, pt denies at this time. A&O x 4. EMS VSS.

## 2020-04-18 NOTE — Discharge Instructions (Addendum)
Call the orthopedic specialist as discussed in the next 2-3 days.  Nonweightbearing on the right foot. Return immediately back to the ER if:  Your symptoms worsen within the next 12-24 hours. You develop new symptoms such as new fevers, persistent vomiting, new pain, shortness of breath, or new weakness or numbness, or if you have any other concerns.

## 2020-04-20 DIAGNOSIS — Z992 Dependence on renal dialysis: Secondary | ICD-10-CM | POA: Diagnosis not present

## 2020-04-20 DIAGNOSIS — T8249XD Other complication of vascular dialysis catheter, subsequent encounter: Secondary | ICD-10-CM | POA: Diagnosis not present

## 2020-04-20 DIAGNOSIS — N186 End stage renal disease: Secondary | ICD-10-CM | POA: Diagnosis not present

## 2020-04-20 DIAGNOSIS — N2581 Secondary hyperparathyroidism of renal origin: Secondary | ICD-10-CM | POA: Diagnosis not present

## 2020-04-20 DIAGNOSIS — L299 Pruritus, unspecified: Secondary | ICD-10-CM | POA: Diagnosis not present

## 2020-04-20 DIAGNOSIS — D689 Coagulation defect, unspecified: Secondary | ICD-10-CM | POA: Diagnosis not present

## 2020-04-20 DIAGNOSIS — E1129 Type 2 diabetes mellitus with other diabetic kidney complication: Secondary | ICD-10-CM | POA: Diagnosis not present

## 2020-04-20 DIAGNOSIS — D509 Iron deficiency anemia, unspecified: Secondary | ICD-10-CM | POA: Diagnosis not present

## 2020-04-23 DIAGNOSIS — N2581 Secondary hyperparathyroidism of renal origin: Secondary | ICD-10-CM | POA: Diagnosis not present

## 2020-04-23 DIAGNOSIS — D689 Coagulation defect, unspecified: Secondary | ICD-10-CM | POA: Diagnosis not present

## 2020-04-23 DIAGNOSIS — Z992 Dependence on renal dialysis: Secondary | ICD-10-CM | POA: Diagnosis not present

## 2020-04-23 DIAGNOSIS — I150 Renovascular hypertension: Secondary | ICD-10-CM | POA: Diagnosis not present

## 2020-04-23 DIAGNOSIS — D509 Iron deficiency anemia, unspecified: Secondary | ICD-10-CM | POA: Diagnosis not present

## 2020-04-23 DIAGNOSIS — T8249XD Other complication of vascular dialysis catheter, subsequent encounter: Secondary | ICD-10-CM | POA: Diagnosis not present

## 2020-04-23 DIAGNOSIS — N186 End stage renal disease: Secondary | ICD-10-CM | POA: Diagnosis not present

## 2020-04-23 DIAGNOSIS — L299 Pruritus, unspecified: Secondary | ICD-10-CM | POA: Diagnosis not present

## 2020-04-23 DIAGNOSIS — E1129 Type 2 diabetes mellitus with other diabetic kidney complication: Secondary | ICD-10-CM | POA: Diagnosis not present

## 2020-04-24 DIAGNOSIS — R0602 Shortness of breath: Secondary | ICD-10-CM | POA: Diagnosis not present

## 2020-04-25 DIAGNOSIS — E1129 Type 2 diabetes mellitus with other diabetic kidney complication: Secondary | ICD-10-CM | POA: Diagnosis not present

## 2020-04-25 DIAGNOSIS — D689 Coagulation defect, unspecified: Secondary | ICD-10-CM | POA: Diagnosis not present

## 2020-04-25 DIAGNOSIS — N186 End stage renal disease: Secondary | ICD-10-CM | POA: Diagnosis not present

## 2020-04-25 DIAGNOSIS — N2581 Secondary hyperparathyroidism of renal origin: Secondary | ICD-10-CM | POA: Diagnosis not present

## 2020-04-25 DIAGNOSIS — D509 Iron deficiency anemia, unspecified: Secondary | ICD-10-CM | POA: Diagnosis not present

## 2020-04-25 DIAGNOSIS — T8249XD Other complication of vascular dialysis catheter, subsequent encounter: Secondary | ICD-10-CM | POA: Diagnosis not present

## 2020-04-25 DIAGNOSIS — Z992 Dependence on renal dialysis: Secondary | ICD-10-CM | POA: Diagnosis not present

## 2020-04-25 DIAGNOSIS — L299 Pruritus, unspecified: Secondary | ICD-10-CM | POA: Diagnosis not present

## 2020-04-27 ENCOUNTER — Other Ambulatory Visit: Payer: Self-pay | Admitting: Family Medicine

## 2020-04-27 DIAGNOSIS — D689 Coagulation defect, unspecified: Secondary | ICD-10-CM | POA: Diagnosis not present

## 2020-04-27 DIAGNOSIS — Z992 Dependence on renal dialysis: Secondary | ICD-10-CM | POA: Diagnosis not present

## 2020-04-27 DIAGNOSIS — E1129 Type 2 diabetes mellitus with other diabetic kidney complication: Secondary | ICD-10-CM | POA: Diagnosis not present

## 2020-04-27 DIAGNOSIS — D509 Iron deficiency anemia, unspecified: Secondary | ICD-10-CM | POA: Diagnosis not present

## 2020-04-27 DIAGNOSIS — T8249XD Other complication of vascular dialysis catheter, subsequent encounter: Secondary | ICD-10-CM | POA: Diagnosis not present

## 2020-04-27 DIAGNOSIS — L299 Pruritus, unspecified: Secondary | ICD-10-CM | POA: Diagnosis not present

## 2020-04-27 DIAGNOSIS — N2581 Secondary hyperparathyroidism of renal origin: Secondary | ICD-10-CM | POA: Diagnosis not present

## 2020-04-27 DIAGNOSIS — N186 End stage renal disease: Secondary | ICD-10-CM | POA: Diagnosis not present

## 2020-04-30 DIAGNOSIS — Z992 Dependence on renal dialysis: Secondary | ICD-10-CM | POA: Diagnosis not present

## 2020-04-30 DIAGNOSIS — D509 Iron deficiency anemia, unspecified: Secondary | ICD-10-CM | POA: Diagnosis not present

## 2020-04-30 DIAGNOSIS — E1129 Type 2 diabetes mellitus with other diabetic kidney complication: Secondary | ICD-10-CM | POA: Diagnosis not present

## 2020-04-30 DIAGNOSIS — N2581 Secondary hyperparathyroidism of renal origin: Secondary | ICD-10-CM | POA: Diagnosis not present

## 2020-04-30 DIAGNOSIS — N186 End stage renal disease: Secondary | ICD-10-CM | POA: Diagnosis not present

## 2020-04-30 DIAGNOSIS — D689 Coagulation defect, unspecified: Secondary | ICD-10-CM | POA: Diagnosis not present

## 2020-04-30 DIAGNOSIS — L299 Pruritus, unspecified: Secondary | ICD-10-CM | POA: Diagnosis not present

## 2020-04-30 DIAGNOSIS — T8249XD Other complication of vascular dialysis catheter, subsequent encounter: Secondary | ICD-10-CM | POA: Diagnosis not present

## 2020-05-02 DIAGNOSIS — N186 End stage renal disease: Secondary | ICD-10-CM | POA: Diagnosis not present

## 2020-05-02 DIAGNOSIS — E1129 Type 2 diabetes mellitus with other diabetic kidney complication: Secondary | ICD-10-CM | POA: Diagnosis not present

## 2020-05-02 DIAGNOSIS — L299 Pruritus, unspecified: Secondary | ICD-10-CM | POA: Diagnosis not present

## 2020-05-02 DIAGNOSIS — T8249XD Other complication of vascular dialysis catheter, subsequent encounter: Secondary | ICD-10-CM | POA: Diagnosis not present

## 2020-05-02 DIAGNOSIS — N2581 Secondary hyperparathyroidism of renal origin: Secondary | ICD-10-CM | POA: Diagnosis not present

## 2020-05-02 DIAGNOSIS — Z992 Dependence on renal dialysis: Secondary | ICD-10-CM | POA: Diagnosis not present

## 2020-05-02 DIAGNOSIS — D689 Coagulation defect, unspecified: Secondary | ICD-10-CM | POA: Diagnosis not present

## 2020-05-02 DIAGNOSIS — D509 Iron deficiency anemia, unspecified: Secondary | ICD-10-CM | POA: Diagnosis not present

## 2020-05-03 ENCOUNTER — Telehealth: Payer: Self-pay | Admitting: Family Medicine

## 2020-05-03 NOTE — Telephone Encounter (Signed)
LM asking pt to call back to schedule a lab appt and to scheduled a cpe in 58mths from her Jan appt

## 2020-05-04 DIAGNOSIS — T8249XD Other complication of vascular dialysis catheter, subsequent encounter: Secondary | ICD-10-CM | POA: Diagnosis not present

## 2020-05-04 DIAGNOSIS — N2581 Secondary hyperparathyroidism of renal origin: Secondary | ICD-10-CM | POA: Diagnosis not present

## 2020-05-04 DIAGNOSIS — L299 Pruritus, unspecified: Secondary | ICD-10-CM | POA: Diagnosis not present

## 2020-05-04 DIAGNOSIS — D509 Iron deficiency anemia, unspecified: Secondary | ICD-10-CM | POA: Diagnosis not present

## 2020-05-04 DIAGNOSIS — D689 Coagulation defect, unspecified: Secondary | ICD-10-CM | POA: Diagnosis not present

## 2020-05-04 DIAGNOSIS — N186 End stage renal disease: Secondary | ICD-10-CM | POA: Diagnosis not present

## 2020-05-04 DIAGNOSIS — E1129 Type 2 diabetes mellitus with other diabetic kidney complication: Secondary | ICD-10-CM | POA: Diagnosis not present

## 2020-05-04 DIAGNOSIS — Z992 Dependence on renal dialysis: Secondary | ICD-10-CM | POA: Diagnosis not present

## 2020-05-07 DIAGNOSIS — L299 Pruritus, unspecified: Secondary | ICD-10-CM | POA: Diagnosis not present

## 2020-05-07 DIAGNOSIS — N2581 Secondary hyperparathyroidism of renal origin: Secondary | ICD-10-CM | POA: Diagnosis not present

## 2020-05-07 DIAGNOSIS — E1129 Type 2 diabetes mellitus with other diabetic kidney complication: Secondary | ICD-10-CM | POA: Diagnosis not present

## 2020-05-07 DIAGNOSIS — N186 End stage renal disease: Secondary | ICD-10-CM | POA: Diagnosis not present

## 2020-05-07 DIAGNOSIS — T8249XD Other complication of vascular dialysis catheter, subsequent encounter: Secondary | ICD-10-CM | POA: Diagnosis not present

## 2020-05-07 DIAGNOSIS — D689 Coagulation defect, unspecified: Secondary | ICD-10-CM | POA: Diagnosis not present

## 2020-05-07 DIAGNOSIS — Z992 Dependence on renal dialysis: Secondary | ICD-10-CM | POA: Diagnosis not present

## 2020-05-07 DIAGNOSIS — D509 Iron deficiency anemia, unspecified: Secondary | ICD-10-CM | POA: Diagnosis not present

## 2020-05-09 DIAGNOSIS — N186 End stage renal disease: Secondary | ICD-10-CM | POA: Diagnosis not present

## 2020-05-09 DIAGNOSIS — L299 Pruritus, unspecified: Secondary | ICD-10-CM | POA: Diagnosis not present

## 2020-05-09 DIAGNOSIS — D689 Coagulation defect, unspecified: Secondary | ICD-10-CM | POA: Diagnosis not present

## 2020-05-09 DIAGNOSIS — E1129 Type 2 diabetes mellitus with other diabetic kidney complication: Secondary | ICD-10-CM | POA: Diagnosis not present

## 2020-05-09 DIAGNOSIS — D509 Iron deficiency anemia, unspecified: Secondary | ICD-10-CM | POA: Diagnosis not present

## 2020-05-09 DIAGNOSIS — T8249XD Other complication of vascular dialysis catheter, subsequent encounter: Secondary | ICD-10-CM | POA: Diagnosis not present

## 2020-05-09 DIAGNOSIS — N2581 Secondary hyperparathyroidism of renal origin: Secondary | ICD-10-CM | POA: Diagnosis not present

## 2020-05-09 DIAGNOSIS — Z992 Dependence on renal dialysis: Secondary | ICD-10-CM | POA: Diagnosis not present

## 2020-05-10 DIAGNOSIS — R0602 Shortness of breath: Secondary | ICD-10-CM | POA: Diagnosis not present

## 2020-05-11 ENCOUNTER — Telehealth: Payer: Self-pay

## 2020-05-11 DIAGNOSIS — N186 End stage renal disease: Secondary | ICD-10-CM | POA: Diagnosis not present

## 2020-05-11 DIAGNOSIS — E1129 Type 2 diabetes mellitus with other diabetic kidney complication: Secondary | ICD-10-CM | POA: Diagnosis not present

## 2020-05-11 DIAGNOSIS — L299 Pruritus, unspecified: Secondary | ICD-10-CM | POA: Diagnosis not present

## 2020-05-11 DIAGNOSIS — D509 Iron deficiency anemia, unspecified: Secondary | ICD-10-CM | POA: Diagnosis not present

## 2020-05-11 DIAGNOSIS — Z992 Dependence on renal dialysis: Secondary | ICD-10-CM | POA: Diagnosis not present

## 2020-05-11 DIAGNOSIS — D689 Coagulation defect, unspecified: Secondary | ICD-10-CM | POA: Diagnosis not present

## 2020-05-11 DIAGNOSIS — N2581 Secondary hyperparathyroidism of renal origin: Secondary | ICD-10-CM | POA: Diagnosis not present

## 2020-05-11 DIAGNOSIS — T8249XD Other complication of vascular dialysis catheter, subsequent encounter: Secondary | ICD-10-CM | POA: Diagnosis not present

## 2020-05-11 NOTE — Telephone Encounter (Signed)
Spoke with pt who had a left arm AV fistula revision with aneurysm plication, ulcer resection and TDC placement on 04/04/20. Stated dialysis center started using her fistula today and requested to remove TDC. Advised pt, typically her fistula would need to be accessed more than once to make sure functioning properly and [facility] removal request must come for dialysis center. Pt voiced understanding.

## 2020-05-14 DIAGNOSIS — N186 End stage renal disease: Secondary | ICD-10-CM | POA: Diagnosis not present

## 2020-05-14 DIAGNOSIS — L299 Pruritus, unspecified: Secondary | ICD-10-CM | POA: Diagnosis not present

## 2020-05-14 DIAGNOSIS — E1129 Type 2 diabetes mellitus with other diabetic kidney complication: Secondary | ICD-10-CM | POA: Diagnosis not present

## 2020-05-14 DIAGNOSIS — N2581 Secondary hyperparathyroidism of renal origin: Secondary | ICD-10-CM | POA: Diagnosis not present

## 2020-05-14 DIAGNOSIS — D689 Coagulation defect, unspecified: Secondary | ICD-10-CM | POA: Diagnosis not present

## 2020-05-14 DIAGNOSIS — D509 Iron deficiency anemia, unspecified: Secondary | ICD-10-CM | POA: Diagnosis not present

## 2020-05-14 DIAGNOSIS — Z992 Dependence on renal dialysis: Secondary | ICD-10-CM | POA: Diagnosis not present

## 2020-05-14 DIAGNOSIS — T8249XD Other complication of vascular dialysis catheter, subsequent encounter: Secondary | ICD-10-CM | POA: Diagnosis not present

## 2020-05-15 ENCOUNTER — Other Ambulatory Visit: Payer: Self-pay | Admitting: Family Medicine

## 2020-05-15 ENCOUNTER — Other Ambulatory Visit: Payer: Self-pay | Admitting: Cardiovascular Disease

## 2020-05-16 ENCOUNTER — Other Ambulatory Visit: Payer: Self-pay

## 2020-05-16 DIAGNOSIS — N186 End stage renal disease: Secondary | ICD-10-CM | POA: Diagnosis not present

## 2020-05-16 DIAGNOSIS — D689 Coagulation defect, unspecified: Secondary | ICD-10-CM | POA: Diagnosis not present

## 2020-05-16 DIAGNOSIS — D509 Iron deficiency anemia, unspecified: Secondary | ICD-10-CM | POA: Diagnosis not present

## 2020-05-16 DIAGNOSIS — L299 Pruritus, unspecified: Secondary | ICD-10-CM | POA: Diagnosis not present

## 2020-05-16 DIAGNOSIS — Z992 Dependence on renal dialysis: Secondary | ICD-10-CM | POA: Diagnosis not present

## 2020-05-16 DIAGNOSIS — N2581 Secondary hyperparathyroidism of renal origin: Secondary | ICD-10-CM | POA: Diagnosis not present

## 2020-05-16 DIAGNOSIS — E1129 Type 2 diabetes mellitus with other diabetic kidney complication: Secondary | ICD-10-CM | POA: Diagnosis not present

## 2020-05-16 DIAGNOSIS — I10 Essential (primary) hypertension: Secondary | ICD-10-CM

## 2020-05-16 DIAGNOSIS — T8249XD Other complication of vascular dialysis catheter, subsequent encounter: Secondary | ICD-10-CM | POA: Diagnosis not present

## 2020-05-16 MED ORDER — HYDRALAZINE HCL 100 MG PO TABS: 100.0000 mg | ORAL_TABLET | Freq: Two times a day (BID) | ORAL | 0 refills | Status: DC

## 2020-05-18 ENCOUNTER — Other Ambulatory Visit: Payer: Self-pay | Admitting: Family Medicine

## 2020-05-18 DIAGNOSIS — N2581 Secondary hyperparathyroidism of renal origin: Secondary | ICD-10-CM | POA: Diagnosis not present

## 2020-05-18 DIAGNOSIS — L299 Pruritus, unspecified: Secondary | ICD-10-CM | POA: Diagnosis not present

## 2020-05-18 DIAGNOSIS — E1129 Type 2 diabetes mellitus with other diabetic kidney complication: Secondary | ICD-10-CM | POA: Diagnosis not present

## 2020-05-18 DIAGNOSIS — D689 Coagulation defect, unspecified: Secondary | ICD-10-CM | POA: Diagnosis not present

## 2020-05-18 DIAGNOSIS — D509 Iron deficiency anemia, unspecified: Secondary | ICD-10-CM | POA: Diagnosis not present

## 2020-05-18 DIAGNOSIS — N186 End stage renal disease: Secondary | ICD-10-CM | POA: Diagnosis not present

## 2020-05-18 DIAGNOSIS — T8249XD Other complication of vascular dialysis catheter, subsequent encounter: Secondary | ICD-10-CM | POA: Diagnosis not present

## 2020-05-18 DIAGNOSIS — Z992 Dependence on renal dialysis: Secondary | ICD-10-CM | POA: Diagnosis not present

## 2020-05-21 DIAGNOSIS — Z992 Dependence on renal dialysis: Secondary | ICD-10-CM | POA: Diagnosis not present

## 2020-05-21 DIAGNOSIS — D509 Iron deficiency anemia, unspecified: Secondary | ICD-10-CM | POA: Diagnosis not present

## 2020-05-21 DIAGNOSIS — N186 End stage renal disease: Secondary | ICD-10-CM | POA: Diagnosis not present

## 2020-05-21 DIAGNOSIS — N2581 Secondary hyperparathyroidism of renal origin: Secondary | ICD-10-CM | POA: Diagnosis not present

## 2020-05-21 DIAGNOSIS — E1129 Type 2 diabetes mellitus with other diabetic kidney complication: Secondary | ICD-10-CM | POA: Diagnosis not present

## 2020-05-21 DIAGNOSIS — T8249XD Other complication of vascular dialysis catheter, subsequent encounter: Secondary | ICD-10-CM | POA: Diagnosis not present

## 2020-05-21 DIAGNOSIS — L299 Pruritus, unspecified: Secondary | ICD-10-CM | POA: Diagnosis not present

## 2020-05-21 DIAGNOSIS — I150 Renovascular hypertension: Secondary | ICD-10-CM | POA: Diagnosis not present

## 2020-05-21 DIAGNOSIS — D689 Coagulation defect, unspecified: Secondary | ICD-10-CM | POA: Diagnosis not present

## 2020-05-22 DIAGNOSIS — R0602 Shortness of breath: Secondary | ICD-10-CM | POA: Diagnosis not present

## 2020-05-23 DIAGNOSIS — N2581 Secondary hyperparathyroidism of renal origin: Secondary | ICD-10-CM | POA: Diagnosis not present

## 2020-05-23 DIAGNOSIS — N186 End stage renal disease: Secondary | ICD-10-CM | POA: Diagnosis not present

## 2020-05-23 DIAGNOSIS — L299 Pruritus, unspecified: Secondary | ICD-10-CM | POA: Diagnosis not present

## 2020-05-23 DIAGNOSIS — Z992 Dependence on renal dialysis: Secondary | ICD-10-CM | POA: Diagnosis not present

## 2020-05-23 DIAGNOSIS — D689 Coagulation defect, unspecified: Secondary | ICD-10-CM | POA: Diagnosis not present

## 2020-05-23 DIAGNOSIS — T8249XD Other complication of vascular dialysis catheter, subsequent encounter: Secondary | ICD-10-CM | POA: Diagnosis not present

## 2020-05-25 ENCOUNTER — Telehealth: Payer: Self-pay

## 2020-05-25 DIAGNOSIS — N2581 Secondary hyperparathyroidism of renal origin: Secondary | ICD-10-CM | POA: Diagnosis not present

## 2020-05-25 DIAGNOSIS — D689 Coagulation defect, unspecified: Secondary | ICD-10-CM | POA: Diagnosis not present

## 2020-05-25 DIAGNOSIS — L299 Pruritus, unspecified: Secondary | ICD-10-CM | POA: Diagnosis not present

## 2020-05-25 DIAGNOSIS — N186 End stage renal disease: Secondary | ICD-10-CM | POA: Diagnosis not present

## 2020-05-25 DIAGNOSIS — T8249XD Other complication of vascular dialysis catheter, subsequent encounter: Secondary | ICD-10-CM | POA: Diagnosis not present

## 2020-05-25 DIAGNOSIS — Z992 Dependence on renal dialysis: Secondary | ICD-10-CM | POA: Diagnosis not present

## 2020-05-25 NOTE — Telephone Encounter (Signed)
Received a referral from Stephania Fragmin, [place] requesting [facility] removal - To Herma Ard to schedule

## 2020-05-28 DIAGNOSIS — N186 End stage renal disease: Secondary | ICD-10-CM | POA: Diagnosis not present

## 2020-05-28 DIAGNOSIS — D689 Coagulation defect, unspecified: Secondary | ICD-10-CM | POA: Diagnosis not present

## 2020-05-28 DIAGNOSIS — N2581 Secondary hyperparathyroidism of renal origin: Secondary | ICD-10-CM | POA: Diagnosis not present

## 2020-05-28 DIAGNOSIS — T8249XD Other complication of vascular dialysis catheter, subsequent encounter: Secondary | ICD-10-CM | POA: Diagnosis not present

## 2020-05-28 DIAGNOSIS — L299 Pruritus, unspecified: Secondary | ICD-10-CM | POA: Diagnosis not present

## 2020-05-28 DIAGNOSIS — Z992 Dependence on renal dialysis: Secondary | ICD-10-CM | POA: Diagnosis not present

## 2020-05-30 ENCOUNTER — Other Ambulatory Visit: Payer: Self-pay

## 2020-05-30 DIAGNOSIS — Z992 Dependence on renal dialysis: Secondary | ICD-10-CM | POA: Diagnosis not present

## 2020-05-30 DIAGNOSIS — T8249XD Other complication of vascular dialysis catheter, subsequent encounter: Secondary | ICD-10-CM | POA: Diagnosis not present

## 2020-05-30 DIAGNOSIS — L299 Pruritus, unspecified: Secondary | ICD-10-CM | POA: Diagnosis not present

## 2020-05-30 DIAGNOSIS — D689 Coagulation defect, unspecified: Secondary | ICD-10-CM | POA: Diagnosis not present

## 2020-05-30 DIAGNOSIS — N2581 Secondary hyperparathyroidism of renal origin: Secondary | ICD-10-CM | POA: Diagnosis not present

## 2020-05-30 DIAGNOSIS — N186 End stage renal disease: Secondary | ICD-10-CM | POA: Diagnosis not present

## 2020-06-01 ENCOUNTER — Other Ambulatory Visit: Payer: Self-pay | Admitting: Family Medicine

## 2020-06-01 DIAGNOSIS — L299 Pruritus, unspecified: Secondary | ICD-10-CM | POA: Diagnosis not present

## 2020-06-01 DIAGNOSIS — T8249XD Other complication of vascular dialysis catheter, subsequent encounter: Secondary | ICD-10-CM | POA: Diagnosis not present

## 2020-06-01 DIAGNOSIS — N186 End stage renal disease: Secondary | ICD-10-CM | POA: Diagnosis not present

## 2020-06-01 DIAGNOSIS — N2581 Secondary hyperparathyroidism of renal origin: Secondary | ICD-10-CM | POA: Diagnosis not present

## 2020-06-01 DIAGNOSIS — D689 Coagulation defect, unspecified: Secondary | ICD-10-CM | POA: Diagnosis not present

## 2020-06-01 DIAGNOSIS — Z992 Dependence on renal dialysis: Secondary | ICD-10-CM | POA: Diagnosis not present

## 2020-06-04 DIAGNOSIS — T8249XD Other complication of vascular dialysis catheter, subsequent encounter: Secondary | ICD-10-CM | POA: Diagnosis not present

## 2020-06-04 DIAGNOSIS — E1129 Type 2 diabetes mellitus with other diabetic kidney complication: Secondary | ICD-10-CM | POA: Diagnosis not present

## 2020-06-04 DIAGNOSIS — N2581 Secondary hyperparathyroidism of renal origin: Secondary | ICD-10-CM | POA: Diagnosis not present

## 2020-06-04 DIAGNOSIS — D689 Coagulation defect, unspecified: Secondary | ICD-10-CM | POA: Diagnosis not present

## 2020-06-04 DIAGNOSIS — Z992 Dependence on renal dialysis: Secondary | ICD-10-CM | POA: Diagnosis not present

## 2020-06-04 DIAGNOSIS — L299 Pruritus, unspecified: Secondary | ICD-10-CM | POA: Diagnosis not present

## 2020-06-04 DIAGNOSIS — N186 End stage renal disease: Secondary | ICD-10-CM | POA: Diagnosis not present

## 2020-06-05 ENCOUNTER — Inpatient Hospital Stay (HOSPITAL_COMMUNITY): Admission: RE | Admit: 2020-06-05 | Payer: Medicare HMO | Source: Ambulatory Visit

## 2020-06-06 DIAGNOSIS — L299 Pruritus, unspecified: Secondary | ICD-10-CM | POA: Diagnosis not present

## 2020-06-06 DIAGNOSIS — N2581 Secondary hyperparathyroidism of renal origin: Secondary | ICD-10-CM | POA: Diagnosis not present

## 2020-06-06 DIAGNOSIS — T8249XD Other complication of vascular dialysis catheter, subsequent encounter: Secondary | ICD-10-CM | POA: Diagnosis not present

## 2020-06-06 DIAGNOSIS — Z992 Dependence on renal dialysis: Secondary | ICD-10-CM | POA: Diagnosis not present

## 2020-06-06 DIAGNOSIS — E1129 Type 2 diabetes mellitus with other diabetic kidney complication: Secondary | ICD-10-CM | POA: Diagnosis not present

## 2020-06-06 DIAGNOSIS — N186 End stage renal disease: Secondary | ICD-10-CM | POA: Diagnosis not present

## 2020-06-06 DIAGNOSIS — D689 Coagulation defect, unspecified: Secondary | ICD-10-CM | POA: Diagnosis not present

## 2020-06-07 DIAGNOSIS — R0602 Shortness of breath: Secondary | ICD-10-CM | POA: Diagnosis not present

## 2020-06-07 NOTE — Telephone Encounter (Signed)
PA submitted to ''CoverMyMeds''

## 2020-06-07 NOTE — Telephone Encounter (Signed)
PA was approved. 

## 2020-06-08 DIAGNOSIS — T8249XD Other complication of vascular dialysis catheter, subsequent encounter: Secondary | ICD-10-CM | POA: Diagnosis not present

## 2020-06-08 DIAGNOSIS — D689 Coagulation defect, unspecified: Secondary | ICD-10-CM | POA: Diagnosis not present

## 2020-06-08 DIAGNOSIS — L299 Pruritus, unspecified: Secondary | ICD-10-CM | POA: Diagnosis not present

## 2020-06-08 DIAGNOSIS — N2581 Secondary hyperparathyroidism of renal origin: Secondary | ICD-10-CM | POA: Diagnosis not present

## 2020-06-08 DIAGNOSIS — Z992 Dependence on renal dialysis: Secondary | ICD-10-CM | POA: Diagnosis not present

## 2020-06-08 DIAGNOSIS — E1129 Type 2 diabetes mellitus with other diabetic kidney complication: Secondary | ICD-10-CM | POA: Diagnosis not present

## 2020-06-08 DIAGNOSIS — N186 End stage renal disease: Secondary | ICD-10-CM | POA: Diagnosis not present

## 2020-06-11 ENCOUNTER — Encounter (HOSPITAL_COMMUNITY): Payer: Self-pay | Admitting: Internal Medicine

## 2020-06-11 ENCOUNTER — Inpatient Hospital Stay (HOSPITAL_COMMUNITY)
Admission: EM | Admit: 2020-06-11 | Discharge: 2020-06-19 | DRG: 193 | Disposition: A | Discharge status: Home Health | Payer: Medicare HMO | Attending: Internal Medicine | Admitting: Internal Medicine

## 2020-06-11 ENCOUNTER — Emergency Department (HOSPITAL_COMMUNITY): Payer: Medicare HMO

## 2020-06-11 ENCOUNTER — Other Ambulatory Visit: Payer: Self-pay

## 2020-06-11 DIAGNOSIS — E1122 Type 2 diabetes mellitus with diabetic chronic kidney disease: Secondary | ICD-10-CM | POA: Diagnosis present

## 2020-06-11 DIAGNOSIS — Z79899 Other long term (current) drug therapy: Secondary | ICD-10-CM

## 2020-06-11 DIAGNOSIS — Z940 Kidney transplant status: Secondary | ICD-10-CM | POA: Diagnosis not present

## 2020-06-11 DIAGNOSIS — Z87891 Personal history of nicotine dependence: Secondary | ICD-10-CM

## 2020-06-11 DIAGNOSIS — I10 Essential (primary) hypertension: Secondary | ICD-10-CM | POA: Diagnosis not present

## 2020-06-11 DIAGNOSIS — Z8661 Personal history of infections of the central nervous system: Secondary | ICD-10-CM

## 2020-06-11 DIAGNOSIS — Z992 Dependence on renal dialysis: Secondary | ICD-10-CM | POA: Diagnosis not present

## 2020-06-11 DIAGNOSIS — R509 Fever, unspecified: Secondary | ICD-10-CM | POA: Diagnosis not present

## 2020-06-11 DIAGNOSIS — R7989 Other specified abnormal findings of blood chemistry: Secondary | ICD-10-CM | POA: Diagnosis present

## 2020-06-11 DIAGNOSIS — N186 End stage renal disease: Secondary | ICD-10-CM | POA: Diagnosis not present

## 2020-06-11 DIAGNOSIS — G9340 Encephalopathy, unspecified: Secondary | ICD-10-CM | POA: Diagnosis not present

## 2020-06-11 DIAGNOSIS — E669 Obesity, unspecified: Secondary | ICD-10-CM | POA: Diagnosis present

## 2020-06-11 DIAGNOSIS — A419 Sepsis, unspecified organism: Secondary | ICD-10-CM | POA: Diagnosis not present

## 2020-06-11 DIAGNOSIS — Y830 Surgical operation with transplant of whole organ as the cause of abnormal reaction of the patient, or of later complication, without mention of misadventure at the time of the procedure: Secondary | ICD-10-CM | POA: Diagnosis not present

## 2020-06-11 DIAGNOSIS — I120 Hypertensive chronic kidney disease with stage 5 chronic kidney disease or end stage renal disease: Secondary | ICD-10-CM | POA: Diagnosis present

## 2020-06-11 DIAGNOSIS — I2510 Atherosclerotic heart disease of native coronary artery without angina pectoris: Secondary | ICD-10-CM | POA: Diagnosis present

## 2020-06-11 DIAGNOSIS — Z87442 Personal history of urinary calculi: Secondary | ICD-10-CM | POA: Diagnosis not present

## 2020-06-11 DIAGNOSIS — Z888 Allergy status to other drugs, medicaments and biological substances status: Secondary | ICD-10-CM | POA: Diagnosis not present

## 2020-06-11 DIAGNOSIS — J849 Interstitial pulmonary disease, unspecified: Secondary | ICD-10-CM | POA: Diagnosis present

## 2020-06-11 DIAGNOSIS — R0689 Other abnormalities of breathing: Secondary | ICD-10-CM | POA: Diagnosis not present

## 2020-06-11 DIAGNOSIS — R000 Tachycardia, unspecified: Secondary | ICD-10-CM | POA: Diagnosis not present

## 2020-06-11 DIAGNOSIS — G9341 Metabolic encephalopathy: Secondary | ICD-10-CM | POA: Diagnosis present

## 2020-06-11 DIAGNOSIS — G894 Chronic pain syndrome: Secondary | ICD-10-CM | POA: Diagnosis not present

## 2020-06-11 DIAGNOSIS — R29810 Facial weakness: Secondary | ICD-10-CM | POA: Diagnosis not present

## 2020-06-11 DIAGNOSIS — F418 Other specified anxiety disorders: Secondary | ICD-10-CM | POA: Diagnosis present

## 2020-06-11 DIAGNOSIS — R6520 Severe sepsis without septic shock: Secondary | ICD-10-CM | POA: Diagnosis present

## 2020-06-11 DIAGNOSIS — R69 Illness, unspecified: Secondary | ICD-10-CM | POA: Diagnosis not present

## 2020-06-11 DIAGNOSIS — Z79891 Long term (current) use of opiate analgesic: Secondary | ICD-10-CM

## 2020-06-11 DIAGNOSIS — E785 Hyperlipidemia, unspecified: Secondary | ICD-10-CM | POA: Diagnosis present

## 2020-06-11 DIAGNOSIS — Z86010 Personal history of colonic polyps: Secondary | ICD-10-CM

## 2020-06-11 DIAGNOSIS — R531 Weakness: Secondary | ICD-10-CM | POA: Diagnosis not present

## 2020-06-11 DIAGNOSIS — J189 Pneumonia, unspecified organism: Secondary | ICD-10-CM | POA: Diagnosis not present

## 2020-06-11 DIAGNOSIS — E1169 Type 2 diabetes mellitus with other specified complication: Secondary | ICD-10-CM | POA: Diagnosis present

## 2020-06-11 DIAGNOSIS — N250 Renal osteodystrophy: Secondary | ICD-10-CM | POA: Diagnosis not present

## 2020-06-11 DIAGNOSIS — Z8249 Family history of ischemic heart disease and other diseases of the circulatory system: Secondary | ICD-10-CM

## 2020-06-11 DIAGNOSIS — E042 Nontoxic multinodular goiter: Secondary | ICD-10-CM | POA: Diagnosis present

## 2020-06-11 DIAGNOSIS — Z20822 Contact with and (suspected) exposure to covid-19: Secondary | ICD-10-CM | POA: Diagnosis not present

## 2020-06-11 DIAGNOSIS — K219 Gastro-esophageal reflux disease without esophagitis: Secondary | ICD-10-CM | POA: Diagnosis present

## 2020-06-11 DIAGNOSIS — R4182 Altered mental status, unspecified: Secondary | ICD-10-CM | POA: Diagnosis not present

## 2020-06-11 DIAGNOSIS — Z90710 Acquired absence of both cervix and uterus: Secondary | ICD-10-CM

## 2020-06-11 DIAGNOSIS — R42 Dizziness and giddiness: Secondary | ICD-10-CM | POA: Diagnosis not present

## 2020-06-11 DIAGNOSIS — G40909 Epilepsy, unspecified, not intractable, without status epilepticus: Secondary | ICD-10-CM | POA: Diagnosis present

## 2020-06-11 DIAGNOSIS — J9611 Chronic respiratory failure with hypoxia: Secondary | ICD-10-CM | POA: Diagnosis not present

## 2020-06-11 DIAGNOSIS — I1311 Hypertensive heart and chronic kidney disease without heart failure, with stage 5 chronic kidney disease, or end stage renal disease: Secondary | ICD-10-CM | POA: Diagnosis not present

## 2020-06-11 DIAGNOSIS — I517 Cardiomegaly: Secondary | ICD-10-CM | POA: Diagnosis not present

## 2020-06-11 DIAGNOSIS — Z6833 Body mass index (BMI) 33.0-33.9, adult: Secondary | ICD-10-CM

## 2020-06-11 DIAGNOSIS — T8612 Kidney transplant failure: Secondary | ICD-10-CM | POA: Diagnosis present

## 2020-06-11 DIAGNOSIS — G9389 Other specified disorders of brain: Secondary | ICD-10-CM | POA: Diagnosis not present

## 2020-06-11 DIAGNOSIS — E1129 Type 2 diabetes mellitus with other diabetic kidney complication: Secondary | ICD-10-CM | POA: Diagnosis not present

## 2020-06-11 DIAGNOSIS — D631 Anemia in chronic kidney disease: Secondary | ICD-10-CM | POA: Diagnosis present

## 2020-06-11 DIAGNOSIS — R0902 Hypoxemia: Secondary | ICD-10-CM | POA: Diagnosis not present

## 2020-06-11 HISTORY — DX: End stage renal disease: N18.6

## 2020-06-11 LAB — CBG MONITORING, ED: Glucose-Capillary: 106 mg/dL — ABNORMAL HIGH (ref 70–99)

## 2020-06-11 LAB — CBC WITH DIFFERENTIAL/PLATELET
Abs Immature Granulocytes: 0.04 10*3/uL (ref 0.00–0.07)
Basophils Absolute: 0.1 10*3/uL (ref 0.0–0.1)
Basophils Relative: 0 %
Eosinophils Absolute: 0.1 10*3/uL (ref 0.0–0.5)
Eosinophils Relative: 1 %
HCT: 33.2 % — ABNORMAL LOW (ref 36.0–46.0)
Hemoglobin: 10.7 g/dL — ABNORMAL LOW (ref 12.0–15.0)
Immature Granulocytes: 0 %
Lymphocytes Relative: 8 %
Lymphs Abs: 0.9 10*3/uL (ref 0.7–4.0)
MCH: 33.1 pg (ref 26.0–34.0)
MCHC: 32.2 g/dL (ref 30.0–36.0)
MCV: 102.8 fL — ABNORMAL HIGH (ref 80.0–100.0)
Monocytes Absolute: 0.6 10*3/uL (ref 0.1–1.0)
Monocytes Relative: 5 %
Neutro Abs: 9.9 10*3/uL — ABNORMAL HIGH (ref 1.7–7.7)
Neutrophils Relative %: 86 %
Platelets: 118 10*3/uL — ABNORMAL LOW (ref 150–400)
RBC: 3.23 MIL/uL — ABNORMAL LOW (ref 3.87–5.11)
RDW: 13.3 % (ref 11.5–15.5)
WBC: 11.7 10*3/uL — ABNORMAL HIGH (ref 4.0–10.5)
nRBC: 0 % (ref 0.0–0.2)

## 2020-06-11 LAB — COMPREHENSIVE METABOLIC PANEL
ALT: 15 U/L (ref 0–44)
AST: 18 U/L (ref 15–41)
Albumin: 3.5 g/dL (ref 3.5–5.0)
Alkaline Phosphatase: 103 U/L (ref 38–126)
Anion gap: 18 — ABNORMAL HIGH (ref 5–15)
BUN: 44 mg/dL — ABNORMAL HIGH (ref 8–23)
CO2: 28 mmol/L (ref 22–32)
Calcium: 8.1 mg/dL — ABNORMAL LOW (ref 8.9–10.3)
Chloride: 93 mmol/L — ABNORMAL LOW (ref 98–111)
Creatinine, Ser: 11.21 mg/dL — ABNORMAL HIGH (ref 0.44–1.00)
GFR, Estimated: 4 mL/min — ABNORMAL LOW (ref >60)
Glucose, Bld: 179 mg/dL — ABNORMAL HIGH (ref 70–99)
Potassium: 3.5 mmol/L (ref 3.5–5.1)
Sodium: 139 mmol/L (ref 135–145)
Total Bilirubin: 0.6 mg/dL (ref 0.3–1.2)
Total Protein: 7.4 g/dL (ref 6.5–8.1)

## 2020-06-11 LAB — RESP PANEL BY RT-PCR (FLU A&B, COVID) ARPGX2
Influenza A by PCR: NEGATIVE
Influenza B by PCR: NEGATIVE
SARS Coronavirus 2 by RT PCR: NEGATIVE

## 2020-06-11 LAB — LACTIC ACID, PLASMA
Lactic Acid, Venous: 2.8 mmol/L (ref 0.5–1.9)
Lactic Acid, Venous: 1.7 mmol/L (ref 0.5–1.9)

## 2020-06-11 LAB — PROTIME-INR
INR: 1.1 (ref 0.8–1.2)
Prothrombin Time: 13.4 seconds (ref 11.4–15.2)

## 2020-06-11 LAB — PROCALCITONIN: Procalcitonin: 0.37 ng/mL

## 2020-06-11 LAB — APTT: aPTT: 20 seconds — ABNORMAL LOW (ref 24–36)

## 2020-06-11 MED ORDER — ACETAMINOPHEN 650 MG RE SUPP: 650.0000 mg | Freq: Four times a day (QID) | RECTAL | Status: DC | PRN

## 2020-06-11 MED ORDER — DULOXETINE HCL 20 MG PO CPEP
20.0000 mg | ORAL_CAPSULE | Freq: Every day | ORAL | Status: DC
  Administered 2020-06-11 – 2020-06-19 (×9): 20 mg via ORAL
  Filled 2020-06-11 (×9): qty 1

## 2020-06-11 MED ORDER — CALCIUM CARBONATE ANTACID 1250 MG/5ML PO SUSP
500.0000 mg | Freq: Four times a day (QID) | ORAL | Status: DC | PRN
  Filled 2020-06-11: qty 5

## 2020-06-11 MED ORDER — AMLODIPINE BESYLATE 10 MG PO TABS
10.0000 mg | ORAL_TABLET | Freq: Every day | ORAL | Status: DC
  Administered 2020-06-11 – 2020-06-19 (×9): 10 mg via ORAL
  Filled 2020-06-11: qty 1
  Filled 2020-06-11: qty 2
  Filled 2020-06-11 (×7): qty 1

## 2020-06-11 MED ORDER — HYDROXYZINE HCL 25 MG PO TABS: 25.0000 mg | ORAL_TABLET | Freq: Three times a day (TID) | ORAL | Status: DC | PRN

## 2020-06-11 MED ORDER — ACETAMINOPHEN 500 MG PO TABS
1000.0000 mg | ORAL_TABLET | Freq: Once | ORAL | Status: AC
  Administered 2020-06-11: 1000 mg via ORAL
  Filled 2020-06-11: qty 2

## 2020-06-11 MED ORDER — METRONIDAZOLE IN NACL 5-0.79 MG/ML-% IV SOLN
500.0000 mg | Freq: Once | INTRAVENOUS | Status: AC
  Administered 2020-06-11: 500 mg via INTRAVENOUS
  Filled 2020-06-11: qty 100

## 2020-06-11 MED ORDER — TRAZODONE HCL 50 MG PO TABS: 50.0000 mg | ORAL_TABLET | Freq: Every evening | ORAL | Status: DC | PRN

## 2020-06-11 MED ORDER — VANCOMYCIN HCL 2000 MG/400ML IV SOLN
2000.0000 mg | Freq: Once | INTRAVENOUS | Status: AC
  Administered 2020-06-11: 2000 mg via INTRAVENOUS
  Filled 2020-06-11: qty 400

## 2020-06-11 MED ORDER — PREGABALIN 100 MG PO CAPS
100.0000 mg | ORAL_CAPSULE | Freq: Three times a day (TID) | ORAL | Status: DC
Start: 2020-06-11 — End: 2020-06-11

## 2020-06-11 MED ORDER — ACETAMINOPHEN 325 MG PO TABS
650.0000 mg | ORAL_TABLET | Freq: Four times a day (QID) | ORAL | Status: DC | PRN
  Administered 2020-06-19: 650 mg via ORAL
  Filled 2020-06-11: qty 2

## 2020-06-11 MED ORDER — QUETIAPINE FUMARATE 200 MG PO TABS: 200.0000 mg | ORAL_TABLET | Freq: Two times a day (BID) | ORAL | Status: DC

## 2020-06-11 MED ORDER — ZOLPIDEM TARTRATE 5 MG PO TABS: 5.0000 mg | ORAL_TABLET | Freq: Every evening | ORAL | Status: DC | PRN

## 2020-06-11 MED ORDER — PANTOPRAZOLE SODIUM 40 MG PO TBEC
40.0000 mg | DELAYED_RELEASE_TABLET | Freq: Every day | ORAL | Status: DC
  Administered 2020-06-12 – 2020-06-19 (×8): 40 mg via ORAL
  Filled 2020-06-11 (×8): qty 1

## 2020-06-11 MED ORDER — NEPRO/CARBSTEADY PO LIQD
237.0000 mL | Freq: Three times a day (TID) | ORAL | Status: DC | PRN
  Filled 2020-06-11: qty 237

## 2020-06-11 MED ORDER — VANCOMYCIN HCL 1500 MG/300ML IV SOLN
1500.0000 mg | Freq: Once | INTRAVENOUS | Status: DC
  Filled 2020-06-11: qty 300

## 2020-06-11 MED ORDER — ONDANSETRON HCL 4 MG/2ML IJ SOLN: 4.0000 mg | Freq: Four times a day (QID) | INTRAMUSCULAR | Status: DC | PRN

## 2020-06-11 MED ORDER — SORBITOL 70 % SOLN
30.0000 mL | Status: DC | PRN
  Filled 2020-06-11: qty 30

## 2020-06-11 MED ORDER — ATORVASTATIN CALCIUM 40 MG PO TABS
40.0000 mg | ORAL_TABLET | Freq: Every day | ORAL | Status: DC
  Administered 2020-06-11 – 2020-06-19 (×9): 40 mg via ORAL
  Filled 2020-06-11: qty 4
  Filled 2020-06-11 (×8): qty 1

## 2020-06-11 MED ORDER — ISOSORBIDE DINITRATE 20 MG PO TABS
20.0000 mg | ORAL_TABLET | Freq: Three times a day (TID) | ORAL | Status: DC
  Administered 2020-06-11 – 2020-06-19 (×24): 20 mg via ORAL
  Filled 2020-06-11 (×28): qty 1

## 2020-06-11 MED ORDER — PREGABALIN 25 MG PO CAPS: 50.0000 mg | ORAL_CAPSULE | Freq: Two times a day (BID) | ORAL | Status: DC

## 2020-06-11 MED ORDER — HYDRALAZINE HCL 25 MG PO TABS
100.0000 mg | ORAL_TABLET | Freq: Two times a day (BID) | ORAL | Status: DC
  Administered 2020-06-11 – 2020-06-19 (×16): 100 mg via ORAL
  Filled 2020-06-11 (×16): qty 4

## 2020-06-11 MED ORDER — CHLORHEXIDINE GLUCONATE CLOTH 2 % EX PADS
6.0000 | MEDICATED_PAD | Freq: Every day | CUTANEOUS | Status: DC
  Administered 2020-06-12 – 2020-06-15 (×3): 6 via TOPICAL

## 2020-06-11 MED ORDER — SODIUM CHLORIDE 0.9% FLUSH
3.0000 mL | Freq: Two times a day (BID) | INTRAVENOUS | Status: DC
  Administered 2020-06-12 – 2020-06-18 (×13): 3 mL via INTRAVENOUS

## 2020-06-11 MED ORDER — ONDANSETRON HCL 4 MG PO TABS: 4.0000 mg | ORAL_TABLET | Freq: Four times a day (QID) | ORAL | Status: DC | PRN

## 2020-06-11 MED ORDER — FERRIC CITRATE 1 GM 210 MG(FE) PO TABS
210.0000 mg | ORAL_TABLET | Freq: Three times a day (TID) | ORAL | Status: DC
  Administered 2020-06-11 – 2020-06-19 (×20): 210 mg via ORAL
  Filled 2020-06-11 (×20): qty 1

## 2020-06-11 MED ORDER — CAMPHOR-MENTHOL 0.5-0.5 % EX LOTN
1.0000 "application " | TOPICAL_LOTION | Freq: Three times a day (TID) | CUTANEOUS | Status: DC | PRN
  Filled 2020-06-11: qty 222

## 2020-06-11 MED ORDER — QUETIAPINE FUMARATE 100 MG PO TABS
200.0000 mg | ORAL_TABLET | Freq: Two times a day (BID) | ORAL | Status: DC
  Administered 2020-06-11 – 2020-06-19 (×16): 200 mg via ORAL
  Filled 2020-06-11 (×3): qty 2
  Filled 2020-06-11: qty 1
  Filled 2020-06-11 (×13): qty 2

## 2020-06-11 MED ORDER — HEPARIN SODIUM (PORCINE) 5000 UNIT/ML IJ SOLN
5000.0000 [IU] | Freq: Three times a day (TID) | INTRAMUSCULAR | Status: DC
  Administered 2020-06-11 – 2020-06-19 (×21): 5000 [IU] via SUBCUTANEOUS
  Filled 2020-06-11 (×22): qty 1

## 2020-06-11 MED ORDER — VANCOMYCIN HCL 1000 MG/200ML IV SOLN
1000.0000 mg | INTRAVENOUS | Status: DC
  Filled 2020-06-11: qty 200

## 2020-06-11 MED ORDER — PREGABALIN 75 MG PO CAPS
75.0000 mg | ORAL_CAPSULE | Freq: Every day | ORAL | Status: DC
  Administered 2020-06-11: 75 mg via ORAL
  Filled 2020-06-11: qty 1

## 2020-06-11 MED ORDER — SODIUM CHLORIDE 0.9 % IV SOLN
2.0000 g | Freq: Once | INTRAVENOUS | Status: AC
  Administered 2020-06-11: 2 g via INTRAVENOUS
  Filled 2020-06-11: qty 2

## 2020-06-11 MED ORDER — ALBUTEROL SULFATE HFA 108 (90 BASE) MCG/ACT IN AERS
2.0000 | INHALATION_SPRAY | RESPIRATORY_TRACT | Status: DC | PRN
  Filled 2020-06-11: qty 6.7

## 2020-06-11 MED ORDER — LACTATED RINGERS IV SOLN: INTRAVENOUS | Status: DC

## 2020-06-11 MED ORDER — SODIUM CHLORIDE 0.9 % IV BOLUS
500.0000 mL | Freq: Once | INTRAVENOUS | Status: AC
  Administered 2020-06-11: 500 mL via INTRAVENOUS

## 2020-06-11 MED ORDER — OXYCODONE-ACETAMINOPHEN 7.5-325 MG PO TABS: 1.0000 | ORAL_TABLET | ORAL | Status: DC | PRN

## 2020-06-11 MED ORDER — DOCUSATE SODIUM 283 MG RE ENEM
1.0000 | ENEMA | RECTAL | Status: DC | PRN
  Filled 2020-06-11: qty 1

## 2020-06-11 MED ORDER — LORATADINE 10 MG PO TABS: 10.0000 mg | ORAL_TABLET | Freq: Every day | ORAL | Status: DC | PRN

## 2020-06-11 MED ORDER — SODIUM CHLORIDE 0.9 % IV SOLN
1.0000 g | INTRAVENOUS | Status: DC
  Administered 2020-06-12 – 2020-06-13 (×2): 1 g via INTRAVENOUS
  Filled 2020-06-11 (×2): qty 1

## 2020-06-11 MED ORDER — RENA-VITE PO TABS
1.0000 | ORAL_TABLET | ORAL | Status: DC
  Administered 2020-06-12 – 2020-06-15 (×4): 1 via ORAL
  Filled 2020-06-11 (×5): qty 1

## 2020-06-11 NOTE — ED Provider Notes (Signed)
[facility] EMERGENCY DEPARTMENT Provider Note   CSN: [id number] Arrival date & time: 06/11/20  8756     History Chief Complaint  Patient presents with  . Weakness    [name] [name] is a 65 y.o. female with hx of ESRD on dialysis, CAD, DM, HTN, HLD, and seizures who presents for generalized weakness after dialysis this morning. She completed roughly 4 hours of dialysis and afterwards was in the restroom when she felt weak and needed help. She also has dizziness and feels chilly, she was found to have a fever here. She reports that she was in her usual state of health prior to this, no infectious symptoms over the past few days including fever, chills, shortness of breath, abdominal pain, n/v/d. No known sick contacts. She makes urine still, denies dysuria or frequency.   Past Medical History:  Diagnosis Date  . Adenomatous colon polyp 04/1998  . Anemia   . Anxiety   . Arthritis    HIp  . Carotid artery disease ([place])    Carotid US 1/18: bilateral ICA 1-39, R thyroid lobe nodule (1.9x2.2x3cm); numerous L thyroid lobe nodules - repeat 1 year  . Chronic kidney disease    M/W/F [place] [address]  . Chronic renal failure    post transplant  . Depression   . Diabetes mellitus    diet controlled  . EBV infection    Per EPP  . Encephalitis    NMDA per IRJ  . Esophagitis    Grade 1 Distal  . GERD (gastroesophageal reflux disease)   . Hemorrhoids   . History of blood transfusion   . History of kidney stones    Passed  . History of subdural hematoma   . Hyperkalemia   . Hyperlipidemia   . Hypertension   . Hypomagnesemia   . Left jugular vein thrombosis    Partial resolved per [facility]  . Metabolic acidosis   . On home oxygen therapy    2.5 liters  . Pneumonia   . Right jugular vein thrombosis    resolved from [facility]  . Seizures [facility])     Patient Active Problem List   Diagnosis Date Noted  . Sepsis due to undetermined organism ([place]) 06/11/2020  . Shortness of  breath 01/05/2020  . Cough 01/05/2020  . Abnormal findings on diagnostic imaging of lung 01/05/2020  . Chronic respiratory failure with hypoxia ([place]) 01/05/2020  . Peripheral neuropathy 09/22/2019  . Tumor 07/28/2019  . History of seizures 07/28/2019  . Tremor 07/28/2019  . Stress incontinence 12/24/2017  . Cough variant asthma 12/24/2017  . Tremor of left hand 12/24/2017  . History of internal jugular thrombosis 03/26/2017  . 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 Anemia 07/08/2016  . Abnormal brain MRI 07/03/2016  . Fasciculation 06/18/2016  . Carotid artery disease ([place])   . Encounter for screening for COVID-19 01/28/2016  . Hyperlipidemia 06/28/2015  . Diabetes mellitus type II, controlled ([place]) 06/28/2015  . Thoracic ascending aortic aneurysm (41 mm on Echo 06/2013) 07/06/2013  . ESRD on dialysis ([place]) 06/16/2013  . History of renal transplant 06/16/2013  . GERD (gastroesophageal reflux disease) 08/13/2009  . OTH&UNSPEC NONINFECTIOUS GASTROENTERITIS&COLITIS 08/13/2009  . Essential hypertension 04/25/2009  . Hemorrhoids 04/25/2009  . WEIGHT LOSS 04/25/2009  . NAUSEA AND VOMITING 04/25/2009  . PERSONAL HX COLONIC POLYPS 04/25/2009    Past Surgical History:  Procedure Laterality Date  . A/V FISTULAGRAM Left 06/04/2017   Procedure: A/V FISTULAGRAM;  Surgeon: Conrad Marie,  MD;  Location: [place] CV LAB;  Service: Cardiovascular;  Laterality: Left;  . A/V FISTULAGRAM N/A 03/24/2019   Procedure: A/V FISTULAGRAM - left arm;  Surgeon: [name], MD;  Location: [place] CV LAB;  Service: Cardiovascular;  Laterality: N/A;  . A/V FISTULAGRAM N/A 03/08/2020   Procedure: A/V FISTULAGRAM - Left arm;  Surgeon: [name], MD;  Location: [place] CV LAB;  Service: Cardiovascular;  Laterality: N/A;  . ABDOMINAL HYSTERECTOMY    . AV FISTULA PLACEMENT  07/04/2005   [place] AV fistula  . AV FISTULA PLACEMENT  08/27/2005  .  AV FISTULA PLACEMENT Left 04/29/2017   Procedure: Creation of Left Arm ARTERIOVENOUS BRACHIOCEPHALIC FISTULA;  Surgeon: Elam Dutch, MD;  Location: [place];  Service: Vascular;  Laterality: Left;  . AV FISTULA PLACEMENT Left 06/16/2017   Procedure: BRACHIO_BASCILIC ARTERIOVENOUS (AV) FISTULA CREATION;  Surgeon: Waynetta Sandy, MD;  Location: [place];  Service: Vascular;  Laterality: Left;  . AV FISTULA PLACEMENT W/ PTFE  08/27/2005  . BASCILIC VEIN TRANSPOSITION Left 06/16/2017   Procedure: BRACHIOCEPHALIC TRANSPOSITION  LEFT ARM;  Surgeon: Waynetta Sandy, MD;  Location: [place];  Service: Vascular;  Laterality: Left;  . BASCILIC VEIN TRANSPOSITION Left 09/01/2017   Procedure: BASILIC VEIN TRANSPOSITION SECOND STAGE;  Surgeon: Waynetta Sandy, MD;  Location: [place];  Service: Vascular;  Laterality: Left;  . BRAIN BIOPSY    . CESAREAN SECTION    . COLONOSCOPY W/ POLYPECTOMY    . DG AV DIALYSIS GRAFT DECLOT OR  07/24/2005   AV Gore-Tex graf  . DG AV DIALYSIS GRAFT DECLOT OR  Thrombosis right forearm, loop arteriovenous   Thrombosis right forearm, loop arteriovenous graft  . FISTULA SUPERFICIALIZATION Left 0/35/0093   Procedure: PLICATIONS OF ANEURYSM;  Surgeon: [name], MD;  Location: [place];  Service: Vascular;  Laterality: Left;  [name] GASTROSTOMY TUBE PLACEMENT    . INSERTION OF DIALYSIS CATHETER N/A 04/04/2020   Procedure: TUNNELED DIALYSIS CATHETER PLACEMENT;  Surgeon: [name], MD;  Location: [place];  Service: Vascular;  Laterality: N/A;  . KIDNEY TRANSPLANT  2009   Both  . PERIPHERAL VASCULAR BALLOON ANGIOPLASTY Left 03/24/2019   Procedure: PERIPHERAL VASCULAR BALLOON ANGIOPLASTY;  Surgeon: [name], MD;  Location: [place] CV LAB;  Service: Cardiovascular;  Laterality: Left;  left AV fistula  . PERIPHERAL VASCULAR BALLOON ANGIOPLASTY  03/08/2020   Procedure: PERIPHERAL VASCULAR BALLOON ANGIOPLASTY;  Surgeon: [name], MD;  Location: [place] CV LAB;  Service: Cardiovascular;;  . REMOVAL OF GASTROSTOMY TUBE    . REVISON OF ARTERIOVENOUS FISTULA Left 11/23/2018   Procedure: REVISION PLICATION OF ARTERIOVENOUS FISTULA;  Surgeon: Waynetta Sandy, MD;  Location: [place];  Service: Vascular;  Laterality: Left;  . REVISON OF ARTERIOVENOUS FISTULA Left 04/04/2020   Procedure: LEFT ARM ARTERIOVENOUS FISTULA REVISON;  Surgeon: [name], MD;  Location: [place];  Service: Vascular;  Laterality: Left;  . THROMBECTOMY / ARTERIOVENOUS GRAFT REVISION  10/12/2006  . THROMBECTOMY / ARTERIOVENOUS GRAFT REVISION  10/16/2006     OB History   No obstetric history on file.     Family History  Problem Relation Age of Onset  . Kidney disease Paternal Aunt   . Heart disease Mother   . Heart disease Father   . Colon cancer Neg Hx     Social History   Tobacco Use  . Smoking status: Former Research scientist (life sciences)  . Smokeless tobacco: Never Used  .  Tobacco comment: smoked in teens  Vaping Use  . Vaping Use: Never used  Substance Use Topics  . Alcohol use: Not Currently  . Drug use: No    Home Medications Prior to Admission medications   Medication Sig Start Date End Date Taking? Authorizing Provider  acetaminophen (TYLENOL) 650 MG CR tablet Take 650-1,300 mg by mouth every 8 (eight) hours as needed for pain.     [provider]  albuterol (VENTOLIN HFA) 108 (90 Base) MCG/ACT inhaler INHALE 2 PUFFS INTO THE LUNGS EVERY 4 (FOUR) HOURS AS NEEDED FOR WHEEZING OR SHORTNESS OF BREATH. 11/30/19   Midge Minium, MD  amLODipine (NORVASC) 10 MG tablet TAKE 1 TABLET BY MOUTH EVERY DAY 02/21/20   Midge Minium, MD  atorvastatin (LIPITOR) 40 MG tablet Take 1 tablet (40 mg total) by mouth daily. Please make yearly appt with Dr. Acie Fredrickson for April 2022 for future refills. Thank you 1st attempt 05/16/20   Nahser, Wonda Cheng, MD  blood glucose meter kit and supplies KIT Dispense based on patient and insurance  preference. Patient should test sugars twice daily as directed. Dx. E11.9 12/10/18   Midge Minium, MD  cetirizine (ZYRTEC) 10 MG tablet TAKE 1 TABLET BY MOUTH EVERY DAY Patient taking differently: Take 10 mg by mouth daily. 07/27/19   Midge Minium, MD  DULoxetine (CYMBALTA) 20 MG capsule TAKE 1 CAPSULE BY MOUTH EVERY DAY 11/21/19   Midge Minium, MD  ferric citrate (AURYXIA) 1 GM 210 MG(Fe) tablet Take 210 mg by mouth See admin instructions. Take 210 mg by mouth three times a day with meals and 210 mg two times a day with snacks    [provider]  hydrALAZINE (APRESOLINE) 100 MG tablet Take 1 tablet (100 mg total) by mouth 2 (two) times daily. 05/16/20   Midge Minium, MD  isosorbide dinitrate (ISORDIL) 20 MG tablet TAKE 1 TABLET BY MOUTH THREE TIMES A DAY 03/22/20   Midge Minium, MD  loratadine (CLARITIN) 10 MG tablet Take 10 mg by mouth daily as needed for allergies.    [provider]  Methoxy PEG-Epoetin Beta (MIRCERA IJ) Mircera 11/02/19 10/31/20  [provider]  multivitamin (RENA-VIT) TABS tablet Take 1 tablet by mouth every morning.  03/20/17   [provider]  nitroGLYCERIN (NITROSTAT) 0.4 MG SL tablet Place 0.4 mg under the tongue every 5 (five) minutes as needed for chest pain.  06/16/13   [provider]  omeprazole (PRILOSEC) 40 MG capsule TAKE 1 CAPSULE BY MOUTH EVERY DAY 05/16/20   Midge Minium, MD  [facility] ULTRA test strip USE AS INSTRUCTED TO TEST SUGARS TWICE DAILY. DX. E11.9 04/27/20   Midge Minium, MD  oxyCODONE-acetaminophen (PERCOCET) 7.5-325 MG tablet Take 1 tablet by mouth every 4 (four) hours as needed for severe pain. 04/04/20 04/04/21  Setzer, Edman Circle, PA-C  pregabalin (LYRICA) 50 MG capsule Take 50 mg by mouth 2 (two) times daily. 11/29/19   [provider]  QUEtiapine (SEROQUEL) 100 MG tablet TAKE 2 TABLETS BY MOUTH 2 TIMES DAILY. 06/07/20   Midge Minium, MD  traZODone  (DESYREL) 50 MG tablet Take 50 mg by mouth at bedtime as needed for sleep.  07/29/19   [provider]    Allergies    Lisinopril and Daypro [oxaprozin]  Review of Systems   Review of Systems  Constitutional: Positive for chills and fever.  HENT: Negative for dental problem and sore throat.   Respiratory:  Negative for chest tightness and shortness of breath.   Cardiovascular: Negative for chest pain and leg swelling.  Gastrointestinal: Negative for abdominal pain, diarrhea, nausea and vomiting.  Genitourinary: Negative for dysuria and frequency.  Musculoskeletal: Negative for myalgias and neck stiffness.  Skin: Negative for rash and wound.  Neurological: Positive for dizziness and weakness. Negative for seizures, syncope and headaches.  All other systems reviewed and are negative.   Physical Exam Updated Vital Signs BP 131/74   Pulse 92   Temp (!) 102.5 F (39.2 C) (Oral)   Resp (!) 22   Ht '5\' 6"'  (1.676 m)   Wt 82.6 kg   SpO2 97%   BMI 29.38 kg/m   Physical Exam Vitals and nursing note reviewed.  Constitutional:      General: She is not in acute distress.    Appearance: She is obese. She is ill-appearing. She is not diaphoretic.     Comments: somnolent  HENT:     Head: Normocephalic and atraumatic.     Right Ear: External ear normal.     Left Ear: External ear normal.     Nose: Nose normal.     Mouth/Throat:     Mouth: Mucous membranes are moist.  Eyes:     Extraocular Movements: Extraocular movements intact.  Cardiovascular:     Rate and Rhythm: Regular rhythm. Tachycardia present.     Heart sounds: Normal heart sounds. No murmur heard. No friction rub. No gallop.      Comments: AV graft in R arm, fistula in L arm Pulmonary:     Effort: Pulmonary effort is normal. No respiratory distress.     Breath sounds: No stridor. No wheezing, rhonchi or rales.  Abdominal:     Palpations: Abdomen is soft. There is no mass.     Tenderness: There is no abdominal  tenderness. There is no guarding or rebound.     Hernia: No hernia is present.     Comments: Appears distended Decreased bowel sounds Old scar on L abdomen consistent with prior colostomy site, clean with no sign of infection or drainage  Musculoskeletal:        General: No deformity or signs of injury.     Cervical back: Normal range of motion and neck supple. No rigidity.     Right lower leg: No edema.     Left lower leg: No edema.  Skin:    General: Skin is warm and dry.     Comments: No foot wounds bilaterally  Neurological:     General: No focal deficit present.     Mental Status: She is oriented to person, place, and time.     Comments: Moving all extremities spontaneously Somnolent and but answers questions with some difficulty  Psychiatric:        Mood and Affect: Mood normal.     Comments: Unable to thoroughly assess due to somnolence     ED Results / Procedures / Treatments   Labs (all labs ordered are listed, but only abnormal results are displayed) Labs Reviewed  COMPREHENSIVE METABOLIC PANEL - Abnormal; Notable for the following components:      Result Value   Chloride 93 (*)    Glucose, Bld 179 (*)    BUN 44 (*)    Creatinine, Ser 11.21 (*)    Calcium 8.1 (*)    GFR, Estimated 4 (*)    Anion gap 18 (*)    All other components within normal limits  CBC WITH DIFFERENTIAL/PLATELET -  Abnormal; Notable for the following components:   WBC 11.7 (*)    RBC 3.23 (*)    Hemoglobin 10.7 (*)    HCT 33.2 (*)    MCV 102.8 (*)    Platelets 118 (*)    Neutro Abs 9.9 (*)    All other components within normal limits  LACTIC ACID, PLASMA - Abnormal; Notable for the following components:   Lactic Acid, Venous 2.8 (*)    All other components within normal limits  APTT - Abnormal; Notable for the following components:   aPTT 20 (*)    All other components within normal limits  RESP PANEL BY RT-PCR (FLU A&B, COVID) ARPGX2  CULTURE, BLOOD (ROUTINE X 2)  CULTURE, BLOOD  (ROUTINE X 2)  URINE CULTURE  LACTIC ACID, PLASMA  PROTIME-INR  URINALYSIS, ROUTINE W REFLEX MICROSCOPIC    EKG None  Radiology DG Chest Port 1 View  Result Date: 06/11/2020 CLINICAL DATA:  65 year old female with possible sepsis. Fever, dialysis. EXAM: PORTABLE CHEST 1 VIEW COMPARISON:  Portable chest 04/18/2020 and earlier. FINDINGS: Portable AP semi upright view at 0742 hours. Stable left chest dialysis catheter. Lower lung volumes. Stable cardiomegaly and mediastinal contours. Stable trace fluid or thickening along the right minor fissure. Stable pulmonary vascularity without overt edema. No pneumothorax, consolidation. Paucity of bowel gas in the upper abdomen. No acute osseous abnormality identified. IMPRESSION: Lower lung volumes, otherwise stable chest with cardiomegaly and vascular   Procedures Procedures   Medications Ordered in ED Medications  ceFEPIme (MAXIPIME) 1 g in sodium chloride 0.9 % 100 mL IVPB (has no administration in time range)  vancomycin (VANCOREADY) IVPB 1000 mg/200 mL (has no administration in time range)  ceFEPIme (MAXIPIME) 2 g in sodium chloride 0.9 % 100 mL IVPB (0 g Intravenous Stopped 06/11/20 0904)  metroNIDAZOLE (FLAGYL) IVPB 500 mg (0 mg Intravenous Stopped 06/11/20 0922)  vancomycin (VANCOREADY) IVPB 2000 mg/400 mL (0 mg Intravenous Stopped 06/11/20 1104)  sodium chloride 0.9 % bolus 500 mL (0 mLs Intravenous Stopped 06/11/20 1018)  acetaminophen (TYLENOL) tablet 1,000 mg (1,000 mg Oral Given 06/11/20 0901)    ED Course  I have reviewed the triage vital signs and the nursing notes.  Pertinent labs & imaging results that were available during my care of the patient were reviewed by me and considered in my medical decision making (see chart for details).  Clinical Course as of 06/11/20 1245  Mon Jun 11, 2020  0821 Comprehensive metabolic panel(!) [JC]    Clinical Course User Index [JC] Andrew Au, MD   MDM Rules/Calculators/A&P                          Patient met SIRS criteria at presentation with temp 103, pulse 116, RR 24. Oxygen saturation 100% and BP is stable. Activated code sepsis with exception of holding off on fluids given she has ESRD and is hemodynamically stable. Blood cultures pending. Started on broad spectrum antibiotics. Mildly elevated lactic acid that resolved with 500cc bolus. Hydrating gently due to ESRD. At this time, infectious source has not yet been identified and she has no localizing symptoms. Will need to follow up urine studies, blood culture.  Final Clinical Impression(s) / ED Diagnoses Final diagnoses:  Sepsis, due to unspecified organism, unspecified whether acute organ dysfunction present [facility])    Rx / DC Orders ED Discharge Orders    None       Andrew Au, MD 06/11/20 1245  Gareth Morgan, MD 06/13/20 [place], [place], MD 06/13/20 [phone]

## 2020-06-11 NOTE — ED Triage Notes (Signed)
Patient presents from dialysis center, patient completed 4 hours of HD this morning. Staff found patient weak in the bathroom, requiring staff assistance to get up. Patient reports last treatment prior to this morning was last Tuesday. Patient reports diarrhea after dialysis, but denies any other symptoms.

## 2020-06-11 NOTE — Progress Notes (Signed)
Pharmacy Antibiotic Note  MARIEELENA BARTKO is a 65 y.o. female admitted on 06/11/2020 with sepsis.  Pharmacy has been consulted for vancomycin and cefepime dosing.  Hx ESRD-HD usually MWF, last HD this AM.  Plan: Vancomycin 2000 mg IV x 1, then 1000 mg IV qHD Cefepime 2g IV x 1, then 1g IV q 24h Monitor HD schedule, Cx and clinical progression to narrow Vancomycin levels as needed  Height: 5\' 6"  (167.6 cm) Weight: 82.6 kg (182 lb) IBW/kg (Calculated) : 59.3  Temp (24hrs), Avg:103.2 F (39.6 C), Min:103.2 F (39.6 C), Max:103.2 F (39.6 C)  No results for input(s): WBC, CREATININE, LATICACIDVEN, VANCOTROUGH, VANCOPEAK, VANCORANDOM, GENTTROUGH, GENTPEAK, GENTRANDOM, TOBRATROUGH, TOBRAPEAK, TOBRARND, AMIKACINPEAK, AMIKACINTROU, AMIKACIN in the last 168 hours.  CrCl cannot be calculated (Patient's most recent lab result is older than the maximum 21 days allowed.).    Allergies  Allergen Reactions  . Lisinopril Shortness Of Breath, Swelling and Other (See Comments)    Throat irritation also. Patient takes losartan and tolerates fine.  [name] [Oxaprozin] Hives and Dermatitis    Bertis Ruddy, PharmD Clinical Pharmacist ED Pharmacist Phone # [phone] 06/11/2020 7:41 AM

## 2020-06-11 NOTE — H&P (Signed)
History and Physical    [name] [name] OQH:[id number] DOB: Apr 01, 1955 DOA: 06/11/2020  PCP: Midge Minium, MD Consultants:  Carlis Abbott - vascular; Ander Slade - pulmonology Patient coming from:  Home - lives with son; NOK: Son, [phone]  Chief Complaint: Weakness  HPI: [name] [name] is a 65 y.o. female with medical history significant of seizure d/o; HTN; HLD; B jugular vein thrombosis; SDH; ILD on 2.5 L home O2; DM; depression/anxiety; and ESRD on MWF HD presenting with weakness after HD.  She reports that she came because of her breathing.  She went to HD today and got SOB afterwards.  No fevers at home and not at HD.  She felt well before HD.  Ongoing SOB.  + cough, chronic.  She is not having urinary symptoms.  She not been confused.    ED Course:  Sepsis of uncertain etiology.  Weakness after HD, fever to 103.  BP ok.  Started broad spectrum antibiotics.  Given 500cc IVF.  Makes urine, awaiting UA.  Review of Systems: As per HPI; otherwise review of systems reviewed and negative.   Ambulatory Status:  Ambulates without assistance  COVID Vaccine Status:   Complete plus booster  Past Medical History:  Diagnosis Date   Adenomatous colon polyp 04/1998   Anemia    Anxiety    Arthritis    HIp   Carotid artery disease ([place])    Carotid US 1/18: bilateral ICA 1-39, R thyroid lobe nodule (1.9x2.2x3cm); numerous L thyroid lobe nodules - repeat 1 year   Chronic kidney disease    M/W/F [place] [address]   Chronic renal failure    post transplant   Depression    Diabetes mellitus    diet controlled   EBV infection    Per [facility]   Encephalitis    NMDA per [facility]   Esophagitis    Grade 1 Distal   GERD (gastroesophageal reflux disease)    Hemorrhoids    History of blood transfusion    History of kidney stones    Passed   History of subdural hematoma    Hyperkalemia    Hyperlipidemia    Hypertension    Hypomagnesemia    Left jugular vein thrombosis     Partial resolved per [facility]   Metabolic acidosis    On home oxygen therapy    2.5 liters   Pneumonia    Right jugular vein thrombosis    resolved from [place]   Seizures [facility])     Past Surgical History:  Procedure Laterality Date   A/V FISTULAGRAM Left 06/04/2017   Procedure: A/V FISTULAGRAM;  Surgeon: Conrad Kevil, MD;  Location: [place] [facility];  Service: Cardiovascular;  Laterality: Left;   A/V FISTULAGRAM N/A 03/24/2019   Procedure: A/V FISTULAGRAM - left arm;  Surgeon: [name], MD;  Location: [place] [facility];  Service: Cardiovascular;  Laterality: N/A;   A/V FISTULAGRAM N/A 03/08/2020   Procedure: A/V FISTULAGRAM - Left arm;  Surgeon: [name], MD;  Location: [place] [facility];  Service: Cardiovascular;  Laterality: N/A;   ABDOMINAL HYSTERECTOMY     AV FISTULA PLACEMENT  07/04/2005   [place] AV fistula   AV FISTULA PLACEMENT  08/27/2005   AV FISTULA PLACEMENT Left 04/29/2017   Procedure: Creation of Left Arm ARTERIOVENOUS BRACHIOCEPHALIC FISTULA;  Surgeon: Elam Dutch, MD;  Location: [place];  Service: Vascular;  Laterality: Left;   AV FISTULA PLACEMENT Left 06/16/2017   Procedure: BRACHIO_BASCILIC ARTERIOVENOUS (AV) FISTULA  CREATION;  Surgeon: Waynetta Sandy, MD;  Location: [place];  Service: Vascular;  Laterality: Left;   AV FISTULA PLACEMENT W/ PTFE  21/19/4174   BASCILIC VEIN TRANSPOSITION Left 06/16/2017   Procedure: BRACHIOCEPHALIC TRANSPOSITION  LEFT ARM;  Surgeon: Waynetta Sandy, MD;  Location: [place];  Service: Vascular;  Laterality: Left;   [place] Left 09/01/2017   Procedure: BASILIC VEIN TRANSPOSITION SECOND STAGE;  Surgeon: Waynetta Sandy, MD;  Location: [place];  Service: Vascular;  Laterality: Left;   BRAIN BIOPSY     CESAREAN SECTION     COLONOSCOPY W/ POLYPECTOMY     DG AV DIALYSIS GRAFT DECLOT OR  07/24/2005   AV [place] graf   DG AV DIALYSIS GRAFT DECLOT OR   Thrombosis right forearm, loop arteriovenous   Thrombosis right forearm, loop arteriovenous graft   FISTULA SUPERFICIALIZATION Left 0/81/4481   Procedure: PLICATIONS OF ANEURYSM;  Surgeon: [name], MD;  Location: [place];  Service: Vascular;  Laterality: Left;   GASTROSTOMY TUBE PLACEMENT     INSERTION OF DIALYSIS CATHETER N/A 04/04/2020   Procedure: TUNNELED DIALYSIS CATHETER PLACEMENT;  Surgeon: [name], MD;  Location: [place];  Service: Vascular;  Laterality: N/A;   KIDNEY TRANSPLANT  2009   Both   PERIPHERAL VASCULAR BALLOON ANGIOPLASTY Left 03/24/2019   Procedure: PERIPHERAL VASCULAR BALLOON ANGIOPLASTY;  Surgeon: [name], MD;  Location: [place] [facility];  Service: Cardiovascular;  Laterality: Left;  left AV fistula   PERIPHERAL VASCULAR BALLOON ANGIOPLASTY  03/08/2020   Procedure: PERIPHERAL VASCULAR BALLOON ANGIOPLASTY;  Surgeon: [name], MD;  Location: [place] [facility];  Service: Cardiovascular;;   REMOVAL OF GASTROSTOMY TUBE     REVISON OF ARTERIOVENOUS FISTULA Left 11/23/2018   Procedure: REVISION PLICATION OF ARTERIOVENOUS FISTULA;  Surgeon: Waynetta Sandy, MD;  Location: [place];  Service: Vascular;  Laterality: Left;   REVISON OF ARTERIOVENOUS FISTULA Left 04/04/2020   Procedure: LEFT ARM ARTERIOVENOUS FISTULA REVISON;  Surgeon: [name], MD;  Location: [place];  Service: Vascular;  Laterality: Left;   THROMBECTOMY / ARTERIOVENOUS GRAFT REVISION  10/12/2006   THROMBECTOMY / ARTERIOVENOUS GRAFT REVISION  10/16/2006    Social History   Socioeconomic History   Marital status: Widowed    Spouse name: Not on file   Number of children: 2   Years of education: 15   Highest education level: Not on file  Occupational History   Occupation: Disabled  Tobacco Use   Smoking status: Former Smoker   Smokeless tobacco: Never Used   Tobacco comment: smoked in teens  Scientific laboratory technician Use: Never  used  Substance and Sexual Activity   Alcohol use: Not Currently   Drug use: No   Sexual activity: Never  Other Topics Concern   Not on file  Social History Narrative   Denies caffeine use    Social Determinants of Radio broadcast assistant Strain: Low Risk    Difficulty of Paying Living Expenses: Not hard at all  Food Insecurity: No Food Insecurity   Worried About Charity fundraiser in the Last Year: Never true   Arboriculturist in the Last Year: Never true  Transportation Needs: No Transportation Needs   Lack of Transportation (Medical): No   Lack of Transportation (Non-Medical): No  Physical Activity: Inactive   Days of Exercise per Week: 0 days   Minutes of Exercise per Session: 0 min  Stress: No  Stress Concern Present   Feeling of Stress : Not at all  Social Connections: Moderately Isolated   Frequency of Communication with Friends and Family: Once a week   Frequency of Social Gatherings with Friends and Family: Once a week   Attends Religious Services: 1 to 4 times per year   Active Member of [employer] or Organizations: Yes   Attends Archivist Meetings: 1 to 4 times per year   Marital Status: Widowed  Human resources officer Violence: Not At Risk   Fear of Current or Ex-Partner: No   Emotionally Abused: No   Physically Abused: No   Sexually Abused: No    Allergies  Allergen Reactions   Lisinopril Shortness Of Breath, Swelling and Other (See Comments)    Throat irritation also. Patient takes losartan and tolerates fine.   Daypro [Oxaprozin] Hives and Dermatitis    Family History  Problem Relation Age of Onset   Kidney disease Paternal Aunt    Heart disease Mother    Heart disease Father    Colon cancer Neg Hx     Prior to Admission medications   Medication Sig Start Date End Date Taking? Authorizing Provider  acetaminophen (TYLENOL) 650 MG CR tablet Take 650-1,300 mg by mouth every 8 (eight) hours as needed for pain.      [provider]  albuterol (VENTOLIN HFA) 108 (90 Base) MCG/ACT inhaler INHALE 2 PUFFS INTO THE LUNGS EVERY 4 (FOUR) HOURS AS NEEDED FOR WHEEZING OR SHORTNESS OF BREATH. 11/30/19   Midge Minium, MD  amLODipine (NORVASC) 10 MG tablet TAKE 1 TABLET BY MOUTH EVERY DAY 02/21/20   Midge Minium, MD  atorvastatin (LIPITOR) 40 MG tablet Take 1 tablet (40 mg total) by mouth daily. Please make yearly appt with Dr. Acie Fredrickson for April 2022 for future refills. Thank you 1st attempt 05/16/20   Nahser, Wonda Cheng, MD  blood glucose meter kit and supplies KIT Dispense based on patient and insurance preference. Patient should test sugars twice daily as directed. Dx. E11.9 12/10/18   Midge Minium, MD  cetirizine (ZYRTEC) 10 MG tablet TAKE 1 TABLET BY MOUTH EVERY DAY Patient taking differently: Take 10 mg by mouth daily. 07/27/19   Midge Minium, MD  DULoxetine (CYMBALTA) 20 MG capsule TAKE 1 CAPSULE BY MOUTH EVERY DAY 11/21/19   Midge Minium, MD  ferric citrate (AURYXIA) 1 GM 210 MG(Fe) tablet Take 210 mg by mouth See admin instructions. Take 210 mg by mouth three times a day with meals and 210 mg two times a day with snacks    [provider]  hydrALAZINE (APRESOLINE) 100 MG tablet Take 1 tablet (100 mg total) by mouth 2 (two) times daily. 05/16/20   Midge Minium, MD  isosorbide dinitrate (ISORDIL) 20 MG tablet TAKE 1 TABLET BY MOUTH THREE TIMES A DAY 03/22/20   Midge Minium, MD  loratadine (CLARITIN) 10 MG tablet Take 10 mg by mouth daily as needed for allergies.    [provider]  Methoxy PEG-Epoetin Beta (MIRCERA IJ) Mircera 11/02/19 10/31/20  [provider]  multivitamin (RENA-VIT) TABS tablet Take 1 tablet by mouth every morning.  03/20/17   [provider]  nitroGLYCERIN (NITROSTAT) 0.4 MG SL tablet Place 0.4 mg under the tongue every 5 (five) minutes as needed for chest pain.  06/16/13   [provider]  omeprazole  (PRILOSEC) 40 MG capsule TAKE 1 CAPSULE BY MOUTH EVERY DAY 05/16/20   Midge Minium, MD  ONETOUCH ULTRA test strip USE AS INSTRUCTED TO TEST SUGARS TWICE DAILY. DX. E11.9 04/27/20   Midge Minium, MD  oxyCODONE-acetaminophen (PERCOCET) 7.5-325 MG tablet Take 1 tablet by mouth every 4 (four) hours as needed for severe pain. 04/04/20 04/04/21  Setzer, Edman Circle, PA-C  pregabalin (LYRICA) 50 MG capsule Take 50 mg by mouth 2 (two) times daily. 11/29/19   [provider]  QUEtiapine (SEROQUEL) 100 MG tablet TAKE 2 TABLETS BY MOUTH 2 TIMES DAILY. 06/07/20   Midge Minium, MD  traZODone (DESYREL) 50 MG tablet Take 50 mg by mouth at bedtime as needed for sleep.  07/29/19   [provider]    Physical Exam: Vitals:   06/11/20 1435 06/11/20 1523 06/11/20 1600 06/11/20 1700  BP:  122/75 (!) 116/94 133/76  Pulse:  80 78 83  Resp:  18    Temp: 98.6 F (37 C)     TempSrc: Oral     SpO2:  98% 95% 90%  Weight:      Height:          General:  Appears calm and comfortable and is in NAD; somewhat eccentric, ?confused (trying to do the same things on her phone despite clearly unhappy with the result, not focusing on interaction with care team, etc)  Eyes:  PERRL, EOMI, normal lids, iris  ENT:  grossly normal hearing, lips & tongue, mildly dry mm  Neck:  no LAD, masses or thyromegaly  Cardiovascular:  RRR, no m/r/g. No LE edema.   Respiratory:   LLL rhonchi.  Normal respiratory effort.  Abdomen:  soft, NT, ND, NABS  Skin:  no rash or induration seen on limited exam  Musculoskeletal:  grossly normal tone BUE/BLE, good ROM, no bony abnormality  Lower extremity:  No LE edema.  Limited foot exam with no ulcerations.  2+ distal pulses.  Psychiatric: eccentric mood and affect, speech fluent and appropriate, AOx3  Neurologic:  CN 2-12 grossly intact, moves all extremities in coordinated fashion    Radiological Exams on Admission: Independently reviewed - see discussion  in A/P where applicable  DG Chest Port 1 View  Result Date: 06/11/2020 CLINICAL DATA:  65 year old female with possible sepsis. Fever, dialysis. EXAM: PORTABLE CHEST 1 VIEW COMPARISON:  Portable chest 04/18/2020 and earlier. FINDINGS: Portable AP semi upright view at 0742 hours. Stable left chest dialysis catheter. Lower lung volumes. Stable cardiomegaly and mediastinal contours. Stable trace fluid or thickening along the right minor fissure. Stable pulmonary vascularity without overt edema. No pneumothorax, consolidation. Paucity of bowel gas in the upper abdomen. No acute osseous abnormality identified. IMPRESSION: Lower lung volumes, otherwise stable chest with cardiomegaly and vascular   EKG: Independently reviewed.  Sinus tachycardia with rate 110; nonspecific ST changes with no evidence of acute ischemia   Labs on Admission: I have personally reviewed the available labs and imaging studies at the time of the admission.  Pertinent labs:   Glucose 179 BUN 44/Creatinine 11.21/GFR 4 Anion gap 18 Lactate 2.8, 1.7 WBC 11.7 Hgb 10.7 Platelets 118 INR 1.1 COVID/flu negative   Assessment/Plan Principal Problem:   Sepsis due to pneumonia [facility]) Active Problems:   Essential hypertension   ESRD on dialysis ([place])   Hyperlipidemia   Chronic respiratory failure with hypoxia (H[facility])   Chronic pain disorder   Depression with anxiety   Sepsis -Sepsis indicates life-threatening organ dysfunction with mortality >10%, caused by dysregulation to host response.   -SIRS criteria in this patient includes: Leukocytosis, fever, tachycardia, tachypnea  -  Patient has evidence of acute organ failure with elevated lactate >2 that is not easily explained by another condition. -While awaiting blood cultures, this appears to be a preseptic condition. -Sepsis protocol initiated -Suspected source is PNA - patient with respiratory symptoms and apparent LLL rhonchi despite negative CXR -Blood and urine  cultures pending -Will admit for close ongoing monitoring -Treat with IV Cefepime/Vanc for undifferentiated sepsis -Lactate has cleared -Will order procalcitonin level.  Antibiotics would not be indicated for PCT <0.1 and probably should not be used for < 0.25.  >0.5 indicates infection and >>0.5 indicates more serious disease.  As the procalcitonin level normalizes, it will be reasonable to consider de-escalation of antibiotic coverage.  Chronic respiratory failure due to ILD -On 2.5L home O2 -c/o increased SOB and weakness -Possible ILD exacerbation but currently PNA with sepsis appears more likely -Consider pulmonology consult if not improving  ESRD on HD -Patient on chronic MWF HD -Nephrology prn order set utilized -She does not appear to be volume overloaded or otherwise in need of acute HD and just received HD today -Dr. Jonnie Finner notified by Secure Chat that patient will need HD, likely Wednesday if still here -Continue Renavite, Mircera  Chronic pain -I have reviewed this patient in the [place] Controlled Substances Reporting System.  She is receiving medications from only one provider and appears to be taking them as prescribed. -She is not at particularly high risk of opioid misuse, diversion, or overdose. -Continue Lyrica -She is not routinely taking Percocet and this has been discontinued   HTN -Continue Norvasc, hydralazine, Imdur  HLD -Continue Lipitor  Depression/anxiety -Continue Cymbalta, Seroquel, Trazodone    Note: This patient has been tested and is negative for the novel coronavirus COVID-19. She has been fully vaccinated against COVID-19.    DVT prophylaxis:  Heparin Code Status:  Full - confirmed with patient Family Communication: None present Disposition Plan:  The patient is from: home  Anticipated d/c is to: home without [facility] services   Anticipated d/c date will depend on clinical response to treatment, likely 2-3 days  Patient is currently: acutely  ill Consults called: Nephrology  Admission status: Admit - It is my clinical opinion that admission to [place] is reasonable and necessary because of the expectation that this patient will require hospital care that crosses at least 2 midnights to treat this condition based on the medical complexity of the problems presented.  Given the aforementioned information, the predictability of an adverse outcome is felt to be significant.    Karmen Bongo MD [facility]   How to contact the [facility] Attending or Consulting provider [place] or covering provider during after hours [place], for this patient?  1. Check the care team in [facility] and look for a) attending/consulting [facility] provider listed and b) the [facility] team listed 2. Log into [email or web address] and use [place]'s universal password to access. If you do not have the password, please contact the hospital operator. 3. Locate the [facility] provider you are looking for under [facility] and page to a number that you can be directly reached. 4. If you still have difficulty reaching the provider, please page the [facility] (Director on Call) for the Hospitalists listed on amion for assistance.   06/11/2020, 6:24 PM

## 2020-06-12 ENCOUNTER — Encounter (HOSPITAL_COMMUNITY): Payer: Self-pay | Admitting: Internal Medicine

## 2020-06-12 DIAGNOSIS — G894 Chronic pain syndrome: Secondary | ICD-10-CM

## 2020-06-12 DIAGNOSIS — F418 Other specified anxiety disorders: Secondary | ICD-10-CM

## 2020-06-12 DIAGNOSIS — J9611 Chronic respiratory failure with hypoxia: Secondary | ICD-10-CM

## 2020-06-12 LAB — CBC
HCT: 26.7 % — ABNORMAL LOW (ref 36.0–46.0)
Hemoglobin: 8.5 g/dL — ABNORMAL LOW (ref 12.0–15.0)
MCH: 32.2 pg (ref 26.0–34.0)
MCHC: 31.8 g/dL (ref 30.0–36.0)
MCV: 101.1 fL — ABNORMAL HIGH (ref 80.0–100.0)
Platelets: 107 10*3/uL — ABNORMAL LOW (ref 150–400)
RBC: 2.64 MIL/uL — ABNORMAL LOW (ref 3.87–5.11)
RDW: 13.5 % (ref 11.5–15.5)
WBC: 11.2 10*3/uL — ABNORMAL HIGH (ref 4.0–10.5)
nRBC: 0 % (ref 0.0–0.2)

## 2020-06-12 LAB — URINALYSIS, ROUTINE W REFLEX MICROSCOPIC
Bilirubin Urine: NEGATIVE
Glucose, UA: NEGATIVE mg/dL
Ketones, ur: NEGATIVE mg/dL
Nitrite: NEGATIVE
Protein, ur: >=300 mg/dL — AB
Specific Gravity, Urine: 1.016 (ref 1.005–1.030)
WBC, UA: >50 WBC/hpf — ABNORMAL HIGH (ref 0–5)
pH: 5 (ref 5.0–8.0)

## 2020-06-12 LAB — BASIC METABOLIC PANEL
Anion gap: 18 — ABNORMAL HIGH (ref 5–15)
BUN: 53 mg/dL — ABNORMAL HIGH (ref 8–23)
CO2: 26 mmol/L (ref 22–32)
Calcium: 7.8 mg/dL — ABNORMAL LOW (ref 8.9–10.3)
Chloride: 95 mmol/L — ABNORMAL LOW (ref 98–111)
Creatinine, Ser: 11.53 mg/dL — ABNORMAL HIGH (ref 0.44–1.00)
GFR, Estimated: 3 mL/min — ABNORMAL LOW (ref >60)
Glucose, Bld: 110 mg/dL — ABNORMAL HIGH (ref 70–99)
Potassium: 3.7 mmol/L (ref 3.5–5.1)
Sodium: 139 mmol/L (ref 135–145)

## 2020-06-12 LAB — MRSA PCR SCREENING: MRSA by PCR: NEGATIVE

## 2020-06-12 MED ORDER — CHLORHEXIDINE GLUCONATE CLOTH 2 % EX PADS
6.0000 | MEDICATED_PAD | Freq: Every day | CUTANEOUS | Status: DC
  Administered 2020-06-13 – 2020-06-19 (×7): 6 via TOPICAL

## 2020-06-12 MED ORDER — PREGABALIN 75 MG PO CAPS
75.0000 mg | ORAL_CAPSULE | Freq: Two times a day (BID) | ORAL | Status: DC
  Administered 2020-06-12 – 2020-06-13 (×2): 75 mg via ORAL
  Filled 2020-06-12 (×3): qty 1

## 2020-06-12 MED ORDER — DOXERCALCIFEROL 4 MCG/2ML IV SOLN
7.0000 ug | INTRAVENOUS | Status: DC
  Administered 2020-06-15 – 2020-06-19 (×2): 7 ug via INTRAVENOUS

## 2020-06-12 NOTE — Consult Note (Addendum)
Renal Service Consult Note [facility]  [name] [name] 06/12/2020 Sol Blazing, MD Requesting Physician: Dr. Sloan Leiter  Reason for Consult: ESRD pt w/ fever r/o sepsis HPI: The patient is a 65 y.o. year-old w/ hx of seizures, chron resp failure on home o2, HTN, HL, hx SDH, depression, ESRD on HD, h/o failed renal transplant, DM2 presented to ED w/ gen'd weakness. Pt was found weak in the bathroom at the HD unit after completing her HD session. Pt reported SOB. No urinary c/o's. In ED temp was 103 F. BP's were wnl. Pt rec'd 500 cc bolus NS and IV broad spectrum abx. Lactic > 2 recovered to wnl.  Procal was 0.37. Admitted w/ dx of suspected PNA (cough, other sx's) in spite of normal CXR. We are asked to see for dialysis.   Pt seen in room. Had HD yest w/o difficulty. No abd pain, no chest pain. Still makes some urine. Blood cx's at 24 hrs are no growth x 2.  COVID neg and flu neg.   Has had jerking of the UE's for last few months. Is taking lyrica at home 150m tid.  Not on gabapentin. She states she goes to all her HD sessions and doesn't s/o early. Creat here is 11.   She states she has been having a dull pain over her L arm access right about in the middle, but no drainage or erythema.   June 2019 - 2nd stage BB LUA AVF Sept 27564- revision/ plication LUA AVF Dec 2020 - venous PTA LUA AVF Dec 2021 - venous PTA LUA AVF 13/32/9518- LUA AVF plication of pseudoaneurysm      ROS  denies CP  no joint pain   no HA  no blurry vision  no rash  no diarrhea  no nausea/ vomiting    Past Medical History  Past Medical History:  Diagnosis Date  . Adenomatous colon polyp 04/1998  . Anemia   . Anxiety   . Arthritis    HIp  . [place]    Carotid [place]: bilateral ICA 1-39, R thyroid lobe nodule (1.9x2.2x3cm); numerous L thyroid lobe nodules - repeat 1 year  . Chronic kidney disease    M/W/F [place] [address]  . Chronic renal failure    post  transplant  . Depression   . Diabetes mellitus    diet controlled  . EBV infection    Per [facility] . Encephalitis    NMDA per [facility] . Esophagitis    Grade 1 Distal  . GERD (gastroesophageal reflux disease)   . Hemorrhoids   . History of blood transfusion   . History of kidney stones    Passed  . History of subdural hematoma   . Hyperkalemia   . Hyperlipidemia   . Hypertension   . Hypomagnesemia   . Left jugular vein thrombosis    Partial resolved per [facility]  . Metabolic acidosis   . On home oxygen therapy    2.5 liters  . Pneumonia   . Right jugular vein thrombosis    [facility]    Past Surgical History  Past Surgical History:  Procedure Laterality Date  . A/V FISTULAGRAM Left 06/04/2017   Procedure: A/V FISTULAGRAM;  Surgeon: CConrad Santee MD;  [place] [place]  Service: Cardiovascular;  Laterality: Left;  . A/V FISTULAGRAM N/A 03/24/2019   Procedure: A/V FISTULAGRAM - left arm;  Surgeon: CMarty Heck MD;  Location: [place] [facility];  Service: Cardiovascular;  Laterality: N/A;  . A/V FISTULAGRAM N/A 03/08/2020   Procedure: A/V FISTULAGRAM - Left arm;  Surgeon: [name], MD;  Location: [place] [facility] LAB;  Service: Cardiovascular;  Laterality: N/A;  . ABDOMINAL HYSTERECTOMY    . AV FISTULA PLACEMENT  07/04/2005   [place] AV fistula  . AV FISTULA PLACEMENT  08/27/2005  . AV FISTULA PLACEMENT Left 04/29/2017   Procedure: Creation of Left Arm ARTERIOVENOUS BRACHIOCEPHALIC FISTULA;  Surgeon: Elam Dutch, MD;  Location: [place];  Service: Vascular;  Laterality: Left;  . AV FISTULA PLACEMENT Left 06/16/2017   Procedure: BRACHIO_BASCILIC ARTERIOVENOUS (AV) FISTULA CREATION;  Surgeon: Waynetta Sandy, MD;  Location: [place];  Service: Vascular;  Laterality: Left;  . AV FISTULA PLACEMENT W/ PTFE  08/27/2005  . BASCILIC VEIN TRANSPOSITION Left 06/16/2017   Procedure: BRACHIOCEPHALIC TRANSPOSITION  LEFT ARM;  Surgeon:  Waynetta Sandy, MD;  Location: [place];  Service: Vascular;  Laterality: Left;  . BASCILIC VEIN TRANSPOSITION Left 09/01/2017   Procedure: BASILIC VEIN TRANSPOSITION SECOND STAGE;  Surgeon: Waynetta Sandy, MD;  Location: [place];  Service: Vascular;  Laterality: Left;  . BRAIN BIOPSY    . CESAREAN SECTION    . COLONOSCOPY W/ POLYPECTOMY    . DG AV DIALYSIS GRAFT DECLOT OR  07/24/2005   AV Gore-Tex graf  . DG AV DIALYSIS GRAFT DECLOT OR  Thrombosis right forearm, loop arteriovenous   Thrombosis right forearm, loop arteriovenous graft  . FISTULA SUPERFICIALIZATION Left 4/80/1655   Procedure: PLICATIONS OF ANEURYSM;  Surgeon: [name], MD;  Location: [place];  Service: Vascular;  Laterality: Left;  [name] GASTROSTOMY TUBE PLACEMENT    . INSERTION OF DIALYSIS CATHETER N/A 04/04/2020   Procedure: TUNNELED DIALYSIS CATHETER PLACEMENT;  Surgeon: [name], MD;  Location: [place];  Service: Vascular;  Laterality: N/A;  . KIDNEY TRANSPLANT  2009   Both  . PERIPHERAL VASCULAR BALLOON ANGIOPLASTY Left 03/24/2019   Procedure: PERIPHERAL VASCULAR BALLOON ANGIOPLASTY;  Surgeon: [name], MD;  Location: [place] CV LAB;  Service: Cardiovascular;  Laterality: Left;  left AV fistula  . PERIPHERAL VASCULAR BALLOON ANGIOPLASTY  03/08/2020   Procedure: PERIPHERAL VASCULAR BALLOON ANGIOPLASTY;  Surgeon: [name], MD;  Location: [place] CV LAB;  Service: Cardiovascular;;  . REMOVAL OF GASTROSTOMY TUBE    . REVISON OF ARTERIOVENOUS FISTULA Left 11/23/2018   Procedure: REVISION PLICATION OF ARTERIOVENOUS FISTULA;  Surgeon: Waynetta Sandy, MD;  Location: [place];  Service: Vascular;  Laterality: Left;  . REVISON OF ARTERIOVENOUS FISTULA Left 04/04/2020   Procedure: LEFT ARM ARTERIOVENOUS FISTULA REVISON;  Surgeon: [name], MD;  Location: [place];  Service: Vascular;  Laterality: Left;  . THROMBECTOMY / ARTERIOVENOUS GRAFT REVISION   10/12/2006  . THROMBECTOMY / ARTERIOVENOUS GRAFT REVISION  10/16/2006   Family History  Family History  Problem Relation Age of Onset  . Kidney disease Paternal Aunt   . Heart disease Mother   . Heart disease Father   . Colon cancer Neg Hx    Social History  reports that she has quit smoking. She has never used smokeless tobacco. She reports previous alcohol use. She reports that she does not use drugs. Allergies  Allergies  Allergen Reactions  . Lisinopril Shortness Of Breath, Swelling and Other (See Comments)    Throat irritation also. Patient takes losartan and tolerates fine.  [name] [Oxaprozin] Hives and Dermatitis  Home medications Prior to Admission medications   Medication Sig Start Date End Date Taking? Authorizing Provider  acetaminophen (TYLENOL) 650 MG CR tablet Take 650-1,300 mg by mouth every 8 (eight) hours as needed for pain.    Yes [provider]  albuterol (VENTOLIN HFA) 108 (90 Base) MCG/ACT inhaler INHALE 2 PUFFS INTO THE LUNGS EVERY 4 (FOUR) HOURS AS NEEDED FOR WHEEZING OR SHORTNESS OF BREATH. 11/30/19  Yes Midge Minium, MD  amLODipine (NORVASC) 10 MG tablet TAKE 1 TABLET BY MOUTH EVERY DAY Patient taking differently: Take 10 mg by mouth daily. 02/21/20  Yes Midge Minium, MD  atorvastatin (LIPITOR) 40 MG tablet Take 1 tablet (40 mg total) by mouth daily. Please make yearly appt with Dr. Acie Fredrickson for April 2022 for future refills. Thank you 1st attempt 05/16/20  Yes Nahser, Wonda Cheng, MD  blood glucose meter kit and supplies KIT Dispense based on patient and insurance preference. Patient should test sugars twice daily as directed. Dx. E11.9 12/10/18  Yes Midge Minium, MD  cetirizine (ZYRTEC) 10 MG tablet TAKE 1 TABLET BY MOUTH EVERY DAY Patient taking differently: Take 10 mg by mouth daily. 07/27/19  Yes Midge Minium, MD  DULoxetine (CYMBALTA) 20 MG capsule TAKE 1 CAPSULE BY MOUTH EVERY DAY Patient taking differently: Take 20 mg by  mouth daily. 11/21/19  Yes Midge Minium, MD  ferric citrate (AURYXIA) 1 GM 210 MG(Fe) tablet Take 210 mg by mouth See admin instructions. Take 210 mg by mouth three times a day with meals and 210 mg two times a day with snacks   Yes [provider]  hydrALAZINE (APRESOLINE) 100 MG tablet Take 1 tablet (100 mg total) by mouth 2 (two) times daily. 05/16/20  Yes Midge Minium, MD  isosorbide dinitrate (ISORDIL) 20 MG tablet TAKE 1 TABLET BY MOUTH THREE TIMES A DAY Patient taking differently: Take 20 mg by mouth 3 (three) times daily. 03/22/20  Yes Midge Minium, MD  loratadine (CLARITIN) 10 MG tablet Take 10 mg by mouth daily as needed for allergies.   Yes [provider]  Methoxy PEG-Epoetin Beta (MIRCERA IJ) Mircera 11/02/19 10/31/20 Yes [provider]  multivitamin (RENA-VIT) TABS tablet Take 1 tablet by mouth every morning.  03/20/17  Yes [provider]  nitroGLYCERIN (NITROSTAT) 0.4 MG SL tablet Place 0.4 mg under the tongue every 5 (five) minutes as needed for chest pain.  06/16/13  Yes [provider]  omeprazole (PRILOSEC) 40 MG capsule TAKE 1 CAPSULE BY MOUTH EVERY DAY Patient taking differently: Take 40 mg by mouth daily. 05/16/20  Yes Midge Minium, MD  [facility] ULTRA test strip USE AS INSTRUCTED TO TEST SUGARS TWICE DAILY. DX. E11.9 04/27/20  Yes Midge Minium, MD  pregabalin (LYRICA) 100 MG capsule Take 100 mg by mouth 3 (three) times daily. 05/15/20  Yes [provider]  QUEtiapine (SEROQUEL) 200 MG tablet Take 200 mg by mouth 2 (two) times daily. 06/01/20  Yes [provider]  traZODone (DESYREL) 50 MG tablet Take 50 mg by mouth at bedtime as needed for sleep.  07/29/19  Yes [provider]  oxyCODONE-acetaminophen (PERCOCET) 7.5-325 MG tablet Take 1 tablet by mouth every 4 (four) hours as needed for severe pain. Patient not taking: No sig reported 04/04/20 04/04/21  Barbie Banner, PA-C      Vitals:   06/12/20 0010 06/12/20 0200 06/12/20 0400 06/12/20 0600  BP: 132/85 125/72 99/68 119/71  Pulse: 69  70  74  Resp: '14  14 15  ' Temp: 98.5 F (36.9 C)  98 F (36.7 C)   TempSrc: Oral  Oral   SpO2: 96%  98% 99%  Weight:      Height:       Exam Gen alert, no distress No rash, cyanosis or gangrene Sclera anicteric, throat clear  No jvd or bruits Chest clear bilat, poor air movement in general, no rales/ wheezing RRR no MRG Abd soft ntnd no mass or ascites +bs GU defer MS no joint effusions or deformity Ext no LE or UE edema, no wounds or ulcers Neuro is alert, Ox 3 , nf  LUA AVF +bruit, no erythema/ drainage or sign of infection       Home meds:  - norvasc 10/ hydralazine 100 bid/ isordil 20 tid/ sl ntg prn  - prilosec 40 qd/ auryxia 210 tid ac/ lipitor 40 qd  - cymbalta 20 qd/ lyrica 100 tid/ seroquel 200 bid/ trazodone 50 hs prn/ percocet prn  - prn's/ vitamins/ supplements      Na 139  K 3.7  CO2 26  Ca 7.8  AG 18  WBC 11K  Hb 8.5     CXR no acute disease    [facility] HD: [place] [facility]  3.5h  86kg  2/2 bath  Hep 2000  AVF  - hect 7ug tiw  - no esa    Assessment/ Plan: 1. Fever - high fever 103 in ED yest. BCx's neg at 24 hrs. No signs of access infection on exam. AVF unlikely to cause infection. Will order [place] of AVF for fluid collection.  IV abx per pmd.  2. Myoclonic jerking - on high dose of Lyrica 100 tid. Will reduce by 50% to 69m bid, see if this helps. Creat 11, states she is compliant w/ HD. Uremia could be another cause. See how she does on lower dose lyrica. HD tomorrow.  3. ESRD - on HD MWF. HD tomorrow.  4. HTN/ volume - cont BP meds. Needs std weight. CXR is negative. No gross vol excess on exam. UF 2-3 L w/ HD tomorrow.  5. Depression - on multiple medications, per pmd 6. CAD - on nitrates and sl ntg prn 7. Anemia ckd - not on esa at OP unit 8. MBD ckd - cont vdra and binder aVerlee Rossetti MD 06/12/2020, 11:47 AM  Recent Labs   Lab 06/11/20 0710 06/12/20 0306  WBC 11.7* 11.2*  HGB 10.7* 8.5*   Recent Labs  Lab 06/11/20 0710 06/12/20 0306  K 3.5 3.7  BUN 44* 53*  CREATININE 11.21* 11.53*  CALCIUM 8.1* 7.8*

## 2020-06-12 NOTE — Progress Notes (Signed)
PROGRESS NOTE        PATIENT DETAILS Name: [name] [name] Age: 65 y.o. Sex: female Date of Birth: 01/22/1956 Admit Date: 06/11/2020 Admitting Physician Karmen Bongo, MD RCV:ELFYBO, [name], MD  Brief Narrative: Patient is a 65 y.o. female with history of ESRD, HTN, depression/anxiety-chronic hypoxic respiratory failure on 2.5 L of oxygen at home, NMDA encephalitis (2018)-presented with fever and weakness after dialysis.  She was subsequently admitted to the hospitalist service.  See below for further details.  Significant events: 3/21>> admit for evaluation of fever/weakness post HD.  Significant studies: 3/21>> chest x-ray: No pneumonia  Microbiology data: 3/21>> blood culture: No growth 3/21>> influenza/Covid PCR: Negative  Antimicrobial therapy: Vancomycin: 3/21>> Cefepime: 3/21>> Flagyl: 3/21 x 1  Procedures : None  Consults: Renal  DVT Prophylaxis : heparin injection 5,000 Units Start: 06/11/20 1400   Subjective: Feels better-afebrile this morning.  Denies history of cough, nausea, vomiting, diarrhea, abdominal pain.  Denies any headache.  Neck is supple.   Assessment/Plan: SIRS/fever: Foci of infection not clear-await further culture data.  HD site looks unremarkable to me.  For now continue with vancomycin and cefepime.  Doubt she had sepsis physiology on admission  ESRD on HD MWF: Nephrology consulted-defer HD issues to nephrology.  Normocytic anemia: Due to ESRD-acute illness-IVF dilution-no evidence of blood loss-follow closely.  Defer IV iron/Aranesp to nephrology.  Thrombocytopenia: Mild-likely due to infectious issues-stable for close monitoring.  HTN: BP stable-continue Norvasc/hydralazine/Imdur.  HLD: Continue Lipitor  Depression/anxiety: Stable-continue Cymbalta/Seroquel/trazodone  Chronic hypoxic respiratory failure secondary to possible ILD: On 2.5 L of oxygen at all times.  Continue PCCM follow-up in the  outpatient setting  Chronic pain syndrome: Stable-continue Lyrica/Cymbalta  History of NMDA encephalitis-s/p brain biopsy-followed by SDH/IPH along the surgical drain site in 2018 ([facility])  Diet: Diet Order            Diet renal with fluid restriction Fluid restriction: 1200 mL Fluid; Room service appropriate? Yes; Fluid consistency: Thin  Diet effective now                  Code Status: Full code   Family Communication: [phone] updated on 3/22  Disposition Plan: Status is: Inpatient  Remains inpatient appropriate because:Inpatient level of care appropriate due to severity of illness    Dispo: The patient is from: Home              Anticipated d/c is to: Home              Patient currently is not medically stable to d/c.   Difficult to place patient No    Barriers to Discharge:  Antimicrobial agents: Anti-infectives (From admission, onward)   Start     Dose/Rate Route Frequency Ordered Stop   06/13/20 1200  vancomycin (VANCOREADY) IVPB 1000 mg/200 mL        1,000 mg 200 mL/hr over 60 Minutes Intravenous Every M-W-F (Hemodialysis) 06/11/20 0744     06/12/20 0800  ceFEPIme (MAXIPIME) 1 g in sodium chloride 0.9 % 100 mL IVPB        1 g 200 mL/hr over 30 Minutes Intravenous Every 24 hours 06/11/20 0744     06/11/20 0800  vancomycin (VANCOREADY) IVPB 1500 mg/300 mL  Status:  Discontinued        1,500 mg 150 mL/hr over 120 Minutes Intravenous  Once 06/11/20 0739 06/11/20 0743   06/11/20 0800  vancomycin (VANCOREADY) IVPB 2000 mg/400 mL        2,000 mg 200 mL/hr over 120 Minutes Intravenous  Once 06/11/20 0743 06/11/20 1104   06/11/20 0745  ceFEPIme (MAXIPIME) 2 g in sodium chloride 0.9 % 100 mL IVPB        2 g 200 mL/hr over 30 Minutes Intravenous  Once 06/11/20 0739 06/11/20 0904   06/11/20 0745  metroNIDAZOLE (FLAGYL) IVPB 500 mg        500 mg 100 mL/hr over 60 Minutes Intravenous  Once 06/11/20 0739 06/11/20 3614        Time spent: 35 minutes-Greater than 50% of this time was spent in counseling, explanation of diagnosis, planning of further management, and coordination of care.  MEDICATIONS: Scheduled Meds: . amLODipine  10 mg Oral Daily  . atorvastatin  40 mg Oral Daily  . Chlorhexidine Gluconate Cloth  6 each Topical Daily  . DULoxetine  20 mg Oral Daily  . ferric citrate  210 mg Oral TID with meals  . heparin  5,000 Units Subcutaneous Q8H  . hydrALAZINE  100 mg Oral BID  . isosorbide dinitrate  20 mg Oral TID  . multivitamin  1 tablet Oral BH-[id number]  . pantoprazole  40 mg Oral Daily  . pregabalin  75 mg Oral Daily  . QUEtiapine  200 mg Oral BID  . sodium chloride flush  3 mL Intravenous Q12H   Continuous Infusions: . ceFEPime (MAXIPIME) IV    . lactated ringers 100 mL/hr at 06/11/20 2253  . [START ON 06/13/2020] vancomycin     PRN Meds:.acetaminophen **OR** acetaminophen, albuterol, calcium carbonate (dosed in mg elemental calcium), camphor-menthol **AND** hydrOXYzine, docusate sodium, feeding supplement (NEPRO CARB STEADY), loratadine, ondansetron **OR** ondansetron (ZOFRAN) IV, sorbitol, traZODone, zolpidem   PHYSICAL EXAM: Vital signs: Vitals:   06/12/20 0010 06/12/20 0200 06/12/20 0400 06/12/20 0600  BP: 132/85 125/72 99/68 119/71  Pulse: 69  70 74  Resp: 14  14 15   Temp: 98.5 F (36.9 C)  98 F (36.7 C)   TempSrc: Oral  Oral   SpO2: 96%  98% 99%  Weight:      Height:       Filed Weights   06/11/20 0700  Weight: 82.6 kg   Body mass index is 29.38 kg/m.   Gen Exam:Alert awake-not in any distress HEENT:atraumatic, normocephalic Chest: B/L clear to auscultation anteriorly CVS:S1S2 regular Abdomen:soft non tender, non distended Extremities:no edema Neurology: Non focal Skin: no rash  I have personally reviewed following labs and imaging studies  LABORATORY DATA: CBC: Recent Labs  Lab 06/11/20 0710 06/12/20 0306  WBC 11.7* 11.2*  NEUTROABS 9.9*  --   HGB  10.7* 8.5*  HCT 33.2* 26.7*  MCV 102.8* 101.1*  PLT 118* 107*    Basic Metabolic Panel: Recent Labs  Lab 06/11/20 0710 06/12/20 0306  NA 139 139  K 3.5 3.7  CL 93* 95*  CO2 28 26  GLUCOSE 179* 110*  BUN 44* 53*  CREATININE 11.21* 11.53*  CALCIUM 8.1* 7.8*    GFR: Estimated Creatinine Clearance: 5.5 mL/min (A) (by C-G formula based on SCr of 11.53 mg/dL (H)).  Liver Function Tests: Recent Labs  Lab 06/11/20 0710  AST 18  ALT 15  ALKPHOS 103  BILITOT 0.6  PROT 7.4  ALBUMIN 3.5   No results for input(s): LIPASE, AMYLASE in the last 168 hours. No results for input(s): AMMONIA in the last 168 hours.  Coagulation Profile: Recent Labs  Lab 06/11/20 0738  INR 1.1    Cardiac Enzymes: No results for input(s): CKTOTAL, CKMB, CKMBINDEX, TROPONINI in the last 168 hours.  BNP (last 3 results) No results for input(s): PROBNP in the last 8760 hours.  Lipid Profile: No results for input(s): CHOL, HDL, LDLCALC, TRIG, CHOLHDL, LDLDIRECT in the last 72 hours.  Thyroid Function Tests: No results for input(s): TSH, T4TOTAL, FREET4, T3FREE, THYROIDAB in the last 72 hours.  Anemia Panel: No results for input(s): VITAMINB12, FOLATE, FERRITIN, TIBC, IRON, RETICCTPCT in the last 72 hours.  Urine analysis:    Component Value Date/Time   COLORURINE AMBER (A) 07/29/2019 1155   APPEARANCEUR CLOUDY (A) 07/29/2019 1155   LABSPEC 1.018 07/29/2019 1155   PHURINE 6.0 07/29/2019 1155   GLUCOSEU NEGATIVE 07/29/2019 1155   HGBUR NEGATIVE 07/29/2019 1155   BILIRUBINUR NEGATIVE 07/29/2019 1155   BILIRUBINUR negative 07/23/2017 1536   KETONESUR NEGATIVE 07/29/2019 1155   PROTEINUR 100 (A) 07/29/2019 1155   UROBILINOGEN 0.2 07/23/2017 1536   UROBILINOGEN 0.2 09/16/2014 1330   NITRITE NEGATIVE 07/29/2019 1155   LEUKOCYTESUR LARGE (A) 07/29/2019 1155    Sepsis Labs: Lactic Acid, Venous    Component Value Date/Time   LATICACIDVEN 1.7 06/11/2020 1007    MICROBIOLOGY: Recent  Results (from the past 240 hour(s))  Resp Panel by RT-PCR (Flu A&B, Covid) Nasopharyngeal Swab     Status: None   Collection Time: 06/11/20  7:12 AM   Specimen: Nasopharyngeal Swab; Nasopharyngeal(NP) swabs in vial transport medium  Result Value Ref Range Status   SARS Coronavirus 2 by RT PCR NEGATIVE NEGATIVE Final    Comment: (NOTE) SARS-CoV-2 target nucleic acids are NOT DETECTED.  The SARS-CoV-2 RNA is generally detectable in upper respiratory specimens during the acute phase of infection. The lowest concentration of SARS-CoV-2 viral copies this assay can detect is 138 copies/mL. A negative result does not preclude SARS-Cov-2 infection and should not be used as the sole basis for treatment or other patient management decisions. A negative result may occur with  improper specimen collection/handling, submission of specimen other than nasopharyngeal swab, presence of viral mutation(s) within the areas targeted by this assay, and inadequate number of viral copies(<138 copies/mL). A negative result must be combined with clinical observations, patient history, and epidemiological information. The expected result is Negative.  Fact Sheet for Patients:  EntrepreneurPulse.com.au  Fact Sheet for Healthcare Providers:  IncredibleEmployment.be  This test is no t yet approved or cleared by the [place] FDA and  has been authorized for detection and/or diagnosis of SARS-CoV-2 by FDA under an Emergency Use Authorization (EUA). This EUA will remain  in effect (meaning this test can be used) for the duration of the COVID-19 declaration under Section 564(b)(1) of the Act, 21 U.S.C.section 360bbb-3(b)(1), unless the authorization is terminated  or revoked sooner.       Influenza A by PCR NEGATIVE NEGATIVE Final   Influenza B by PCR NEGATIVE NEGATIVE Final    Comment: (NOTE) The Xpert Xpress SARS-CoV-2/FLU/RSV plus assay is intended as an aid in the  diagnosis of influenza from Nasopharyngeal swab specimens and should not be used as a sole basis for treatment. Nasal washings and aspirates are unacceptable for Xpert Xpress SARS-CoV-2/FLU/RSV testing.  Fact Sheet for Patients: EntrepreneurPulse.com.au  Fact Sheet for Healthcare Providers: IncredibleEmployment.be  This test is not yet approved or cleared by the [place] FDA and has been authorized for detection and/or diagnosis of SARS-CoV-2 by FDA under an Emergency Use  Authorization (EUA). This EUA will remain in effect (meaning this test can be used) for the duration of the COVID-19 declaration under Section 564(b)(1) of the Act, 21 U.S.C. section 360bbb-3(b)(1), unless the authorization is terminated or revoked.  Performed at [facility], [place] [address], [place], [place] [address]   Culture, blood (routine x 2)     Status: None (Preliminary result)   Collection Time: 06/11/20  7:16 AM   Specimen: BLOOD RIGHT FOREARM  Result Value Ref Range Status   Specimen Description BLOOD RIGHT FOREARM  Final   Special Requests   Final    BOTTLES DRAWN AEROBIC AND ANAEROBIC Blood Culture results may not be optimal due to an inadequate volume of blood received in culture bottles   Culture   Final    NO GROWTH < 24 HOURS Performed at [facility], [place] [address], [place], [place] [address]    Report Status PENDING  Incomplete  Culture, blood (routine x 2)     Status: None (Preliminary result)   Collection Time: 06/11/20  7:30 AM   Specimen: BLOOD LEFT HAND  Result Value Ref Range Status   Specimen Description BLOOD LEFT HAND  Final   Special Requests   Final    AEROBIC BOTTLE ONLY Blood Culture results may not be optimal due to an inadequate volume of blood received in culture bottles   Culture   Final    NO GROWTH < 24 HOURS Performed at [facility], [place] [address], [place], [place] [address]    Report Status PENDING   Incomplete  MRSA PCR Screening     Status: None   Collection Time: 06/12/20  6:28 AM   Specimen: Nasal Mucosa; Nasopharyngeal  Result Value Ref Range Status   MRSA by PCR NEGATIVE NEGATIVE Final    Comment:        The GeneXpert MRSA Assay (FDA approved for NASAL specimens only), is one component of a comprehensive MRSA colonization surveillance program. It is not intended to diagnose MRSA infection nor to guide or monitor treatment for MRSA infections. Performed at [facility], [place] [address] [place], [place] [address]     RADIOLOGY STUDIES/RESULTS: DG Chest Port 1 View  Result Date: 06/11/2020 CLINICAL DATA:  65 year old female with possible sepsis. Fever, dialysis. EXAM: PORTABLE CHEST 1 VIEW COMPARISON:  Portable chest 04/18/2020 and earlier. FINDINGS: Portable AP semi upright view at 0742 hours. Stable left chest dialysis catheter. Lower lung volumes. Stable cardiomegaly and mediastinal contours. Stable trace fluid or thickening along the right minor fissure. Stable pulmonary vascularity without overt edema. No pneumothorax, consolidation. Paucity of bowel gas in the upper abdomen. No acute osseous abnormality identified. IMPRESSION: Lower lung volumes, otherwise stable chest with cardiomegaly and vascular    LOS: 1 day   Oren Binet, MD  [facility]    To contact the attending provider between 7A-7P or the covering provider during after hours 7P-7A, please log into the web site [email or web address] and access using universal [place] password for that web site. If you do not have the password, please call the hospital operator.  06/12/2020, 9:52 AM

## 2020-06-12 NOTE — Evaluation (Signed)
Physical Therapy Evaluation Patient Details Name: [name] [name] MRN: [id number] DOB: 20-Oct-1955 Today's Date: 06/12/2020   History of Present Illness  Pt is a 65 y.o. female who presented 3/21 with weakness and SOB following her HD appointment. She was admitted with sepsis and suspected PNA, even though CXR was negative. PMH: seizure, HTN, HLD, bil jugular vein thrombosis, SDH, ILD on 2.5L home O2, DM, depression/anxiety, and ESRD on MWF HD.  Clinical Impression   Pt presents with conditions above and deficits mentioned below, see PT Problem List. PTA, she was independent with all functional mobility without AD/AE and driving herself to appointments. She reports residual L-sided incoordination and weakness from a prior CVA, which was noted on eval this date. Pt also demonstrated decreased activity tolerance, ambulating only ~75 ft without UE support before needing to rest. The further she ambulated the more unsteady she became, needing up to minA to recover her balance intermittently. SpO2 noted to be 88% at end of gait bout. Pt is at risk for falls and has been educated on the necessity to have her son closely guard her and assist her with all standing mobility, especially with the stairs as she lives in a 2nd story apartment. Will continue to follow acutely. Pt with 24/7 assistance available from strong son at home, per pt, thus recommending follow-up with HHPT to maximize her safety and independence with all functional mobility.    Follow Up Recommendations Home health PT;Supervision for mobility/OOB    Equipment Recommendations   (TBD)    Recommendations for Other Services       Precautions / Restrictions Precautions Precautions: Fall Restrictions Weight Bearing Restrictions: No      Mobility  Bed Mobility Overal bed mobility: Modified Independent             General bed mobility comments: Pt able to perform all bed mobility safely with HOB elevated.     Transfers Overall transfer level: Needs assistance Equipment used: None Transfers: Sit to/from Stand Sit to Stand: Min guard         General transfer comment: Sit to stand from EOB with min guard for safety, no overt LOB but mild unsteadiness noted.  Ambulation/Gait Ambulation/Gait assistance: Min guard;Min assist Gait Distance (Feet): 75 Feet Assistive device: None Gait Pattern/deviations: Step-through pattern;Decreased step length - right;Decreased step length - left;Decreased stride length;Decreased stance time - left (R lateral trunk flexion) Gait velocity: reduced Gait velocity interpretation: <1.31 ft/sec, indicative of household ambulator General Gait Details: Pt ambulating without UE support, displaying moments of staggering/LOB needing minA to recover on occasion. Pt with R lateral trunk flexion and increased R stance phase time compared to L. Increased shakiness in legs and UEs as distance increased, SpO2 noted to be 88%.  Stairs            Wheelchair Mobility    Modified Rankin (Stroke Patients Only)       Balance Overall balance assessment: Needs assistance Sitting-balance support: No upper extremity supported;Feet supported Sitting balance-Leahy Scale: Good     Standing balance support: No upper extremity supported;During functional activity Standing balance-Leahy Scale: Fair Standing balance comment: Moments of LOB needing min guard-A for mobility without UE support.                             Pertinent Vitals/Pain Pain Assessment: 0-10 Pain Score: 7  Pain Location: L fistula location Pain Descriptors / Indicators: Sharp Pain Intervention(s): Limited activity within  patient's tolerance;Monitored during session;Repositioned    Home Living Family/patient expects to be discharged to:: Private residence Living Arrangements: Children (adult son) Available Help at Discharge: Family;Available 24 hours/day Type of Home: Apartment (2nd  floor, no elevator) Home Access: Stairs to enter Entrance Stairs-Rails: Can reach both Entrance Stairs-Number of Steps: 16 Home Layout: One level Home Equipment: Crutches;Cane - single point;Bedside commode;Shower seat;Hand held shower head      Prior Function Level of Independence: Independent         Comments: Pt drives and manages her own finances and meds. Pt independent without AD/AE for all functional mobility. Pt on 2.5L/min O2 via [place] at home. Pt reports residual L sided weakness and incoordination from prior CVA.     Hand Dominance   Dominant Hand: Left    Extremity/Trunk Assessment   Upper Extremity Assessment Upper Extremity Assessment: RUE deficits/detail;LUE deficits/detail RUE Deficits / Details: MMT scores of 4+ to 5 grossly RUE Sensation:  (denies numbness/tingling) RUE Coordination: decreased fine motor;decreased gross motor LUE Deficits / Details: MMT scores of 4+ to 5 grossly (very negligible moments of weakness compared to R) LUE Sensation:  (denies numbness/tingling) LUE Coordination: decreased fine motor;decreased gross motor (dysdiadochokinesia and possible dysmetria noted)    Lower Extremity Assessment Lower Extremity Assessment: RLE deficits/detail;LLE deficits/detail RLE Deficits / Details: MMT scores of 4+ to 5 grossly RLE Sensation: history of peripheral neuropathy RLE Coordination: decreased gross motor LLE Deficits / Details: MMT scores of 4+ to 5 grossly (very negligible moments of weakness compared to R) LLE Sensation: history of peripheral neuropathy LLE Coordination: decreased fine motor;decreased gross motor (dysdiadochokinesia noted)       Communication   Communication: No difficulties  Cognition Arousal/Alertness: Awake/alert Behavior During Therapy: WFL for tasks assessed/performed Overall Cognitive Status: No family/caregiver present to determine baseline cognitive functioning                                  General Comments: A&Ox4, but pt becomes distracted at times and requests for questions to be repeated.      General Comments General comments (skin integrity, edema, etc.): SpO2 95% prior to gait, 88% at end with pt reporting SOB    Exercises     Assessment/Plan    PT Assessment Patient needs continued PT services  PT Problem List Decreased activity tolerance;Decreased balance;Decreased mobility;Decreased coordination;Decreased safety awareness;Cardiopulmonary status limiting activity;Impaired sensation       PT Treatment Interventions DME instruction;Gait training;Stair training;Functional mobility training;Therapeutic exercise;Therapeutic activities;Balance training;Neuromuscular re-education;Cognitive remediation;Patient/family education    PT Goals (Current goals can be found in the Care Plan section)  Acute Rehab PT Goals Patient Stated Goal: to go home PT Goal Formulation: With patient Time For Goal Achievement: 06/26/20 Potential to Achieve Goals: Good    Frequency Min 3X/week   Barriers to discharge        Co-evaluation               AM-PAC PT "6 Clicks" Mobility  Outcome Measure Help needed turning from your back to your side while in a flat bed without using bedrails?: None Help needed moving from lying on your back to sitting on the side of a flat bed without using bedrails?: None Help needed moving to and from a bed to a chair (including a wheelchair)?: A Little Help needed standing up from a chair using your arms (e.g., wheelchair or bedside chair)?: A Little Help needed to walk  in hospital room?: A Little Help needed climbing 3-5 steps with a railing? : A Lot 6 Click Score: 19    End of Session Equipment Utilized During Treatment: Gait belt;Oxygen (2.5-3L/min throughout) Activity Tolerance: Patient limited by fatigue;Patient tolerated treatment well Patient left: in bed;with call bell/phone within reach;with bed alarm set Nurse Communication:  Mobility status PT Visit Diagnosis: Unsteadiness on feet (R26.81);Other abnormalities of gait and mobility (R26.89);Difficulty in walking, not elsewhere classified (R26.2)    Time: 9483-4758 PT Time Calculation (min) (ACUTE ONLY): 26 min   Charges:   PT Evaluation $PT Eval Moderate Complexity: 1 Mod PT Treatments $Therapeutic Activity: 8-22 mins        Moishe Spice, PT, DPT Acute Rehabilitation Services  Pager: [phone] Office: [phone]   Orvan Falconer 06/12/2020, 4:07 PM

## 2020-06-13 ENCOUNTER — Inpatient Hospital Stay (HOSPITAL_COMMUNITY): Payer: Medicare HMO

## 2020-06-13 DIAGNOSIS — N186 End stage renal disease: Secondary | ICD-10-CM | POA: Diagnosis not present

## 2020-06-13 LAB — RENAL FUNCTION PANEL
Albumin: 2.9 g/dL — ABNORMAL LOW (ref 3.5–5.0)
Anion gap: 19 — ABNORMAL HIGH (ref 5–15)
BUN: 66 mg/dL — ABNORMAL HIGH (ref 8–23)
CO2: 23 mmol/L (ref 22–32)
Calcium: 7.8 mg/dL — ABNORMAL LOW (ref 8.9–10.3)
Chloride: 95 mmol/L — ABNORMAL LOW (ref 98–111)
Creatinine, Ser: 13.09 mg/dL — ABNORMAL HIGH (ref 0.44–1.00)
GFR, Estimated: 3 mL/min — ABNORMAL LOW (ref >60)
Glucose, Bld: 127 mg/dL — ABNORMAL HIGH (ref 70–99)
Phosphorus: 9.8 mg/dL — ABNORMAL HIGH (ref 2.5–4.6)
Potassium: 3.8 mmol/L (ref 3.5–5.1)
Sodium: 137 mmol/L (ref 135–145)

## 2020-06-13 LAB — CBC
HCT: 26.2 % — ABNORMAL LOW (ref 36.0–46.0)
Hemoglobin: 8.7 g/dL — ABNORMAL LOW (ref 12.0–15.0)
MCH: 33.3 pg (ref 26.0–34.0)
MCHC: 33.2 g/dL (ref 30.0–36.0)
MCV: 100.4 fL — ABNORMAL HIGH (ref 80.0–100.0)
Platelets: 97 10*3/uL — ABNORMAL LOW (ref 150–400)
RBC: 2.61 MIL/uL — ABNORMAL LOW (ref 3.87–5.11)
RDW: 13.4 % (ref 11.5–15.5)
WBC: 9.4 10*3/uL (ref 4.0–10.5)
nRBC: 0 % (ref 0.0–0.2)

## 2020-06-13 MED ORDER — SODIUM CHLORIDE 0.9 % IV SOLN: 2.0000 g | INTRAVENOUS | Status: DC

## 2020-06-13 MED ORDER — PREGABALIN 75 MG PO CAPS: 75.0000 mg | ORAL_CAPSULE | Freq: Every day | ORAL | Status: DC

## 2020-06-13 MED ORDER — SODIUM CHLORIDE 0.9 % IV SOLN
1.0000 g | INTRAVENOUS | Status: DC
  Filled 2020-06-13 (×2): qty 1

## 2020-06-13 NOTE — Progress Notes (Signed)
PROGRESS NOTE        PATIENT DETAILS Name: [name] [name] Age: 65 y.o. Sex: female Date of Birth: 1955/08/19 Admit Date: 06/11/2020 Admitting Physician Karmen Bongo, MD IFO:[id number], [name], MD  Brief Narrative: Patient is a 65 y.o. female with history of ESRD, HTN, depression/anxiety-chronic hypoxic respiratory failure on 2.5 L of oxygen at home, NMDA encephalitis (2018)-presented with fever and weakness after dialysis.  She was subsequently admitted to the hospitalist service.  See below for further details.  Significant events: 3/21>> admit for evaluation of fever/weakness post HD.  Significant studies: 3/21>> chest x-ray: No pneumonia  Microbiology data: 3/21>> blood culture: No growth 3/21>> influenza/Covid PCR: Negative  Antimicrobial therapy: Vancomycin: 3/21>> Cefepime: 3/21>> Flagyl: 3/21 x 1  Procedures : None  Consults: Renal  DVT Prophylaxis : heparin injection 5,000 Units Start: 06/11/20 1400   Subjective: Afebrile overnight-no chest pain, shortness of breath, cough, diarrhea.   Assessment/Plan: SIRS/fever: No obvious foci of infection apparent yet-blood cultures remain negative-on vancomycin/cefepime.   Doubt she had sepsis physiology on admission  ESRD on HD MWF: Nephrology following.  Normocytic anemia: Due to ESRD-defer Aranesp/IV iron to nephrology.  No evidence of GI loss at this point.  Thrombocytopenia: Mild-likely due to infectious issues-stable for close monitoring.  HTN: BP stable-continue Norvasc/hydralazine/Imdur.  HLD: Continue Lipitor  Depression/anxiety: Stable-continue Cymbalta/Seroquel/trazodone  Chronic hypoxic respiratory failure secondary to possible ILD: On 2.5 L of oxygen at all times.  Continue PCCM follow-up in the outpatient setting  Chronic pain syndrome: Stable-Lyrica dosage has been adjusted by pharmacy  History of NMDA encephalitis-s/p brain biopsy-followed by SDH/IPH along the  surgical drain site in 2018 ([facility])  Diet: Diet Order            Diet renal with fluid restriction Fluid restriction: 1200 mL Fluid; Room service appropriate? Yes; Fluid consistency: Thin  Diet effective now                  Code Status: Full code   Family Communication: [phone] updated on 3/22  Disposition Plan: Status is: Inpatient  Remains inpatient appropriate because:Inpatient level of care appropriate due to severity of illness    Dispo: The patient is from: Home              Anticipated d/c is to: Home              Patient currently is not medically stable to d/c.   Difficult to place patient No    Barriers to Discharge: Sepsis-unknown foci-remains on IV antibiotics-if remains stable-possible discharge in the next 1-2 days.  Antimicrobial agents: Anti-infectives (From admission, onward)   Start     Dose/Rate Route Frequency Ordered Stop   06/15/20 1200  ceFEPIme (MAXIPIME) 2 g in sodium chloride 0.9 % 100 mL IVPB        2 g 200 mL/hr over 30 Minutes Intravenous Every M-W-F (Hemodialysis) 06/13/20 1109     06/13/20 2000  ceFEPIme (MAXIPIME) 1 g in sodium chloride 0.9 % 100 mL IVPB        1 g 200 mL/hr over 30 Minutes Intravenous Every 24 hours 06/13/20 1109 06/15/20 1959   06/13/20 1200  vancomycin (VANCOREADY) IVPB 1000 mg/200 mL        1,000 mg 200 mL/hr over 60 Minutes Intravenous Every M-W-F (Hemodialysis) 06/11/20 0744  06/12/20 0800  ceFEPIme (MAXIPIME) 1 g in sodium chloride 0.9 % 100 mL IVPB  Status:  Discontinued        1 g 200 mL/hr over 30 Minutes Intravenous Every 24 hours 06/11/20 0744 06/13/20 1109   06/11/20 0800  vancomycin (VANCOREADY) IVPB 1500 mg/300 mL  Status:  Discontinued        1,500 mg 150 mL/hr over 120 Minutes Intravenous  Once 06/11/20 0739 06/11/20 0743   06/11/20 0800  vancomycin (VANCOREADY) IVPB 2000 mg/400 mL        2,000 mg 200 mL/hr over 120 Minutes Intravenous  Once 06/11/20  0743 06/11/20 1104   06/11/20 0745  ceFEPIme (MAXIPIME) 2 g in sodium chloride 0.9 % 100 mL IVPB        2 g 200 mL/hr over 30 Minutes Intravenous  Once 06/11/20 0739 06/11/20 0904   06/11/20 0745  metroNIDAZOLE (FLAGYL) IVPB 500 mg        500 mg 100 mL/hr over 60 Minutes Intravenous  Once 06/11/20 0739 06/11/20 5885       Time spent: 25 minutes-Greater than 50% of this time was spent in counseling, explanation of diagnosis, planning of further management, and coordination of care.  MEDICATIONS: Scheduled Meds: . amLODipine  10 mg Oral Daily  . atorvastatin  40 mg Oral Daily  . Chlorhexidine Gluconate Cloth  6 each Topical Daily  . Chlorhexidine Gluconate Cloth  6 each Topical Q0600  . doxercalciferol  7 mcg Intravenous Q M,W,F-HD  . DULoxetine  20 mg Oral Daily  . ferric citrate  210 mg Oral TID with meals  . heparin  5,000 Units Subcutaneous Q8H  . hydrALAZINE  100 mg Oral BID  . isosorbide dinitrate  20 mg Oral TID  . multivitamin  1 tablet Oral BH-q7a  . pantoprazole  40 mg Oral Daily  . pregabalin  75 mg Oral BID  . QUEtiapine  200 mg Oral BID  . sodium chloride flush  3 mL Intravenous Q12H   Continuous Infusions: . ceFEPime (MAXIPIME) IV    . [START ON 06/15/2020] ceFEPime (MAXIPIME) IV    . lactated ringers 10 mL/hr at 06/12/20 1017  . vancomycin     PRN Meds:.acetaminophen **OR** acetaminophen, albuterol, calcium carbonate (dosed in mg elemental calcium), camphor-menthol **AND** hydrOXYzine, docusate sodium, feeding supplement (NEPRO CARB STEADY), loratadine, ondansetron **OR** ondansetron (ZOFRAN) IV, sorbitol, traZODone, zolpidem   PHYSICAL EXAM: Vital signs: Vitals:   06/12/20 1217 06/12/20 2115 06/13/20 0510 06/13/20 0712  BP: (!) 143/80 140/85 101/71 140/83  Pulse: 85 93 85 82  Resp: 18 16 14 19   Temp: 98.6 F (37 C) 98.4 F (36.9 C) 98.5 F (36.9 C) 98 F (36.7 C)  TempSrc: Oral Oral Oral Oral  SpO2: 94% 92% 92% 93%  Weight:      Height:        Filed Weights   06/11/20 0700  Weight: 82.6 kg   Body mass index is 29.38 kg/m.   Gen Exam:Alert awake-not in any distress HEENT:atraumatic, normocephalic Chest: B/L clear to auscultation anteriorly CVS:S1S2 regular Abdomen:soft non tender, non distended Extremities:no edema Neurology: Non focal Skin: no rash  I have personally reviewed following labs and imaging studies  LABORATORY DATA: CBC: Recent Labs  Lab 06/11/20 0710 06/12/20 0306 06/13/20 0156  WBC 11.7* 11.2* 9.4  NEUTROABS 9.9*  --   --   HGB 10.7* 8.5* 8.7*  HCT 33.2* 26.7* 26.2*  MCV 102.8* 101.1* 100.4*  PLT 118* 107* 97*  Basic Metabolic Panel: Recent Labs  Lab 06/11/20 0710 06/12/20 0306 06/13/20 0156  NA 139 139 137  K 3.5 3.7 3.8  CL 93* 95* 95*  CO2 28 26 23   GLUCOSE 179* 110* 127*  BUN 44* 53* 66*  CREATININE 11.21* 11.53* 13.09*  CALCIUM 8.1* 7.8* 7.8*  PHOS  --   --  9.8*    GFR: Estimated Creatinine Clearance: 4.8 mL/min (A) (by C-G formula based on SCr of 13.09 mg/dL (H)).  Liver Function Tests: Recent Labs  Lab 06/11/20 0710 06/13/20 0156  AST 18  --   ALT 15  --   ALKPHOS 103  --   BILITOT 0.6  --   PROT 7.4  --   ALBUMIN 3.5 2.9*   No results for input(s): LIPASE, AMYLASE in the last 168 hours. No results for input(s): AMMONIA in the last 168 hours.  Coagulation Profile: Recent Labs  Lab 06/11/20 0738  INR 1.1    Cardiac Enzymes: No results for input(s): CKTOTAL, CKMB, CKMBINDEX, TROPONINI in the last 168 hours.  BNP (last 3 results) No results for input(s): PROBNP in the last 8760 hours.  Lipid Profile: No results for input(s): CHOL, HDL, LDLCALC, TRIG, CHOLHDL, LDLDIRECT in the last 72 hours.  Thyroid Function Tests: No results for input(s): TSH, T4TOTAL, FREET4, T3FREE, THYROIDAB in the last 72 hours.  Anemia Panel: No results for input(s): VITAMINB12, FOLATE, FERRITIN, TIBC, IRON, RETICCTPCT in the last 72 hours.  Urine analysis:     Component Value Date/Time   COLORURINE YELLOW 06/12/2020 1600   APPEARANCEUR CLOUDY (A) 06/12/2020 1600   LABSPEC 1.016 06/12/2020 1600   PHURINE 5.0 06/12/2020 1600   GLUCOSEU NEGATIVE 06/12/2020 1600   HGBUR SMALL (A) 06/12/2020 1600   BILIRUBINUR NEGATIVE 06/12/2020 1600   BILIRUBINUR negative 07/23/2017 1536   KETONESUR NEGATIVE 06/12/2020 1600   PROTEINUR >=300 (A) 06/12/2020 1600   UROBILINOGEN 0.2 07/23/2017 1536   UROBILINOGEN 0.2 09/16/2014 1330   NITRITE NEGATIVE 06/12/2020 1600   LEUKOCYTESUR LARGE (A) 06/12/2020 1600    Sepsis Labs: Lactic Acid, Venous    Component Value Date/Time   LATICACIDVEN 1.7 06/11/2020 1007    MICROBIOLOGY: Recent Results (from the past 240 hour(s))  Resp Panel by RT-PCR (Flu A&B, Covid) Nasopharyngeal Swab     Status: None   Collection Time: 06/11/20  7:12 AM   Specimen: Nasopharyngeal Swab; Nasopharyngeal(NP) swabs in vial transport medium  Result Value Ref Range Status   SARS Coronavirus 2 by RT PCR NEGATIVE NEGATIVE Final    Comment: (NOTE) SARS-CoV-2 target nucleic acids are NOT DETECTED.  The SARS-CoV-2 RNA is generally detectable in upper respiratory specimens during the acute phase of infection. The lowest concentration of SARS-CoV-2 viral copies this assay can detect is 138 copies/mL. A negative result does not preclude SARS-Cov-2 infection and should not be used as the sole basis for treatment or other patient management decisions. A negative result may occur with  improper specimen collection/handling, submission of specimen other than nasopharyngeal swab, presence of viral mutation(s) within the areas targeted by this assay, and inadequate number of viral copies(<138 copies/mL). A negative result must be combined with clinical observations, patient history, and epidemiological information. The expected result is Negative.  Fact Sheet for Patients:  EntrepreneurPulse.com.au  Fact Sheet for Healthcare  Providers:  IncredibleEmployment.be  This test is no t yet approved or cleared by the [place] FDA and  has been authorized for detection and/or diagnosis of SARS-CoV-2 by FDA under an Emergency Use  Authorization (EUA). This EUA will remain  in effect (meaning this test can be used) for the duration of the COVID-19 declaration under Section 564(b)(1) of the Act, 21 U.S.C.section 360bbb-3(b)(1), unless the authorization is terminated  or revoked sooner.       Influenza A by PCR NEGATIVE NEGATIVE Final   Influenza B by PCR NEGATIVE NEGATIVE Final    Comment: (NOTE) The Xpert Xpress SARS-CoV-2/FLU/RSV plus assay is intended as an aid in the diagnosis of influenza from Nasopharyngeal swab specimens and should not be used as a sole basis for treatment. Nasal washings and aspirates are unacceptable for Xpert Xpress SARS-CoV-2/FLU/RSV testing.  Fact Sheet for Patients: EntrepreneurPulse.com.au  Fact Sheet for Healthcare Providers: IncredibleEmployment.be  This test is not yet approved or cleared by the [place] FDA and has been authorized for detection and/or diagnosis of SARS-CoV-2 by FDA under an Emergency Use Authorization (EUA). This EUA will remain in effect (meaning this test can be used) for the duration of the COVID-19 declaration under Section 564(b)(1) of the Act, 21 U.S.C. section 360bbb-3(b)(1), unless the authorization is terminated or revoked.  Performed at [facility], [place] [address], [place], [place] [address]   Culture, blood (routine x 2)     Status: None (Preliminary result)   Collection Time: 06/11/20  7:16 AM   Specimen: BLOOD RIGHT FOREARM  Result Value Ref Range Status   Specimen Description BLOOD RIGHT FOREARM  Final   Special Requests   Final    BOTTLES DRAWN AEROBIC AND ANAEROBIC Blood Culture results may not be optimal due to an inadequate volume of blood received in culture bottles    Culture   Final    NO GROWTH 2 DAYS Performed at [facility], [place] [address], [place], [place] [address]    Report Status PENDING  Incomplete  Culture, blood (routine x 2)     Status: None (Preliminary result)   Collection Time: 06/11/20  7:30 AM   Specimen: BLOOD LEFT HAND  Result Value Ref Range Status   Specimen Description BLOOD LEFT HAND  Final   Special Requests   Final    AEROBIC BOTTLE ONLY Blood Culture results may not be optimal due to an inadequate volume of blood received in culture bottles   Culture   Final    NO GROWTH 2 DAYS Performed at [facility], [place] [address], [place], [place] [address]    Report Status PENDING  Incomplete  MRSA PCR Screening     Status: None   Collection Time: 06/12/20  6:28 AM   Specimen: Nasal Mucosa; Nasopharyngeal  Result Value Ref Range Status   MRSA by PCR NEGATIVE NEGATIVE Final    Comment:        The GeneXpert MRSA Assay (FDA approved for NASAL specimens only), is one component of a comprehensive MRSA colonization surveillance program. It is not intended to diagnose MRSA infection nor to guide or monitor treatment for MRSA infections. Performed at [facility], [place] [address], [place], [place] [address]     RADIOLOGY STUDIES/RESULTS: No results found.   LOS: 2 days   Oren Binet, MD  [facility]    To contact the attending provider between 7A-7P or the covering provider during after hours 7P-7A, please log into the web site [email or web address] and access using universal [place] password for that web site. If you do not have the password, please call the hospital operator.  06/13/2020, 12:41 PM

## 2020-06-13 NOTE — Progress Notes (Signed)
[facility] Progress Note  Subjective: seen in room, no new c/o's. Feeling better.   Vitals:   06/12/20 2115 06/13/20 0510 06/13/20 0712 06/13/20 1353  BP: 140/85 101/71 140/83 136/82  Pulse: 93 85 82 80  Resp: 16 14 19 18   Temp: 98.4 F (36.9 C) 98.5 F (36.9 C) 98 F (36.7 C) 97.6 F (36.4 C)  TempSrc: Oral Oral Oral Oral  SpO2: 92% 92% 93% 92%  Weight:      Height:        Exam: Gen alert, no distress No jvd or bruits Chest poor air movement in general, no rales/ wheezing RRR no MRG Abd soft ntnd no mass or ascites +bs Ext no LE edema Neuro is alert, Ox 3 , nf  LUA AVF +bruit, no erythema/ drainage   Home meds:  - norvasc 10/ hydralazine 100 bid/ isordil 20 tid/ sl ntg prn  - prilosec 40 qd/ auryxia 210 tid ac/ lipitor 40 qd  - cymbalta 20 qd/ lyrica 100 tid/ seroquel 200 bid/ trazodone 50 hs prn/ percocet prn  - prn's/ vitamins/ supplements    CXR no acute disease   OP HD: [facility] M[facility]  3.5h  86kg  2/2 bath  Hep 2000  AVF  - hect 7ug tiw  - no esa  Assessment/ Plan: 1. Fever - high fever 103 in ED yest. BCx's neg at 24 hrs. No signs of access infection on exam. AVF unlikely to cause infection. [place] of AVF is pending. IV abx per pmd.  2. Myoclonic jerking - still jerking on lowered dose of Lyrica, will dc completely. Doubt uremia but if this doesn't work will need extra HD.   3. ESRD - on HD MWF. HD today.  4. HTN/ volume - cont BP meds. Needs std weight. CXR is negative. No gross vol excess on exam. UF 2-3 L w/ HD today.  5. Depression - on multiple psych meds, per pmd 6. CAD - on nitrates and sl ntg prn 7. Anemia ckd - not on esa at OP unit 8. MBD ckd - cont vdra and binder Verlee Rossetti 06/13/2020, 3:39 PM   Recent Labs  Lab 06/12/20 0306 06/13/20 0156  K 3.7 3.8  BUN 53* 66*  CREATININE 11.53* 13.09*  CALCIUM 7.8* 7.8*  PHOS  --  9.8*  HGB 8.5* 8.7*   Inpatient medications: . amLODipine  10 mg Oral Daily  .  atorvastatin  40 mg Oral Daily  . Chlorhexidine Gluconate Cloth  6 each Topical Daily  . Chlorhexidine Gluconate Cloth  6 each Topical Q0600  . doxercalciferol  7 mcg Intravenous Q M,W,F-HD  . DULoxetine  20 mg Oral Daily  . ferric citrate  210 mg Oral TID with meals  . heparin  5,000 Units Subcutaneous Q8H  . hydrALAZINE  100 mg Oral BID  . isosorbide dinitrate  20 mg Oral TID  . multivitamin  1 tablet Oral BH-q7a  . pantoprazole  40 mg Oral Daily  . [START ON 06/14/2020] pregabalin  75 mg Oral Daily  . QUEtiapine  200 mg Oral BID  . sodium chloride flush  3 mL Intravenous Q12H   . ceFEPime (MAXIPIME) IV    . [START ON 06/15/2020] ceFEPime (MAXIPIME) IV    . lactated ringers 10 mL/hr at 06/12/20 1017  . vancomycin     acetaminophen **OR** acetaminophen, albuterol, calcium carbonate (dosed in mg elemental calcium), camphor-menthol **AND** hydrOXYzine, docusate sodium, feeding supplement (NEPRO CARB STEADY), loratadine,  ondansetron **OR** ondansetron (ZOFRAN) IV, sorbitol, traZODone, zolpidem

## 2020-06-13 NOTE — Progress Notes (Signed)
Physical Therapy Treatment Patient Details Name: [name] [name] MRN: [id number] DOB: 11-Mar-1956 Today's Date: 06/13/2020    History of Present Illness Pt is a 65 y.o. female who presented 3/21 with weakness and SOB following her HD appointment. She was admitted with sepsis and suspected PNA, even though CXR was negative. PMH: seizure, HTN, HLD, bil jugular vein thrombosis, SDH, ILD on 2.5L home O2, DM, depression/anxiety, and ESRD on MWF HD.    PT Comments    Pt resting upon arrival to room, states she has been having more limb-shaking and ShOB which she is concerned about. Pt ambulatory in hallway without AD today, but requires assist to steady and support body weight given intermittent LE buckling and weaving of gait. Seated rest break needed to recover SpO2 Min 85%, PT cuing pt for breathing technique and increased O2 to 3LO2. Pt reporting feeling weaker with less activity tolerance vs baseline, PT to continue to progress mobility as able.   Follow Up Recommendations  Home health PT;Supervision for mobility/OOB     Equipment Recommendations  Other (comment) (TBD)    Recommendations for Other Services       Precautions / Restrictions Precautions Precautions: Fall Precaution Comments: sats, tremors/buckling Restrictions Weight Bearing Restrictions: No    Mobility  Bed Mobility Overal bed mobility: Modified Independent             General bed mobility comments: increased time, HOB elevated    Transfers Overall transfer level: Needs assistance Equipment used: None Transfers: Sit to/from Stand Sit to Stand: Min guard         General transfer comment: for safety, slow to rise and steady. STS x2, from EOB and chair in hallway.  Ambulation/Gait Ambulation/Gait assistance: Min assist Gait Distance (Feet): 45 Feet (x2; seated rest break) Assistive device: None;1 person hand held assist Gait Pattern/deviations: Step-through pattern;Decreased stride length;Trunk  flexed;Scissoring;Drifts right/left Gait velocity: decr   General Gait Details: min assist for steadying, supporting bodyweight when LEs buckle, VC for reaching for hallway railing as needed to self-steady. Pt with worse shakiness/mild buckling with fatigue, also demos near scissoring of gait. seated rest break x2 minutes, SPO21min 85% on 2LO2, increased to 3LO2 and encouraged pursed lip breathing.   Stairs             Wheelchair Mobility    Modified Rankin (Stroke Patients Only)       Balance Overall balance assessment: Needs assistance Sitting-balance support: No upper extremity supported;Feet supported Sitting balance-Leahy Scale: Good     Standing balance support: No upper extremity supported;During functional activity Standing balance-Leahy Scale: Fair Standing balance comment: Moments of LOB needing min guard-A for mobility without UE support.                            Cognition Arousal/Alertness: Awake/alert Behavior During Therapy: WFL for tasks assessed/performed Overall Cognitive Status: Within Functional Limits for tasks assessed                                        Exercises      General Comments General comments (skin integrity, edema, etc.): SPO2 85% on 2LO2 during gait, VC for breathing technique and O2 increased to 3LO2      Pertinent Vitals/Pain Pain Assessment: No/denies pain Pain Intervention(s): Monitored during session    Home Living  Prior Function            PT Goals (current goals can now be found in the care plan section) Acute Rehab PT Goals Patient Stated Goal: to go home PT Goal Formulation: With patient Time For Goal Achievement: 06/26/20 Potential to Achieve Goals: Good Progress towards PT goals: Progressing toward goals    Frequency    Min 3X/week      PT Plan Current plan remains appropriate    Co-evaluation              AM-PAC PT "6 Clicks"  Mobility   Outcome Measure  Help needed turning from your back to your side while in a flat bed without using bedrails?: None Help needed moving from lying on your back to sitting on the side of a flat bed without using bedrails?: None Help needed moving to and from a bed to a chair (including a wheelchair)?: A Little Help needed standing up from a chair using your arms (e.g., wheelchair or bedside chair)?: A Little Help needed to walk in hospital room?: A Little Help needed climbing 3-5 steps with a railing? : A Lot 6 Click Score: 19    End of Session Equipment Utilized During Treatment: Oxygen (2.5-3L/min throughout) Activity Tolerance: Patient limited by fatigue;Other (comment) (tremors, knee buckling with fatigue) Patient left: in bed;with call bell/phone within reach;with bed alarm set Nurse Communication: Mobility status PT Visit Diagnosis: Unsteadiness on feet (R26.81);Other abnormalities of gait and mobility (R26.89);Difficulty in walking, not elsewhere classified (R26.2)     Time: 9518-8416 PT Time Calculation (min) (ACUTE ONLY): 24 min  Charges:  $Gait Training: 8-22 mins                     [name] [name], PT Acute Rehabilitation Services Pager [phone]  Office [phone]   [name] [name] 06/13/2020, 3:33 PM

## 2020-06-13 NOTE — Progress Notes (Signed)
Dialysis access duplex study completed.   Please see CV Proc for preliminary results.   Darlin Coco, RDMS, RVT with Vonzell Schlatter, RVT

## 2020-06-13 NOTE — Progress Notes (Signed)
[name] Ivory, RN called and informed Pt's HD tx has been moved to 06/14/2020.

## 2020-06-13 NOTE — Progress Notes (Signed)
PT Cancellation Note  Patient Details Name: [name] [name] MRN: [id number] DOB: 1955/07/13   Cancelled Treatment:    Reason Eval/Treat Not Completed: Patient at procedure or test/unavailable  - at HD, PT to check back as schedule allows.  Stacie Glaze, PT Acute Rehabilitation Services Pager [phone]  Office [phone]    Louis Matte 06/13/2020, 9:27 AM

## 2020-06-13 NOTE — TOC Initial Note (Signed)
Transition of Care [facility]) - Initial/Assessment Note    Patient Details  Name: [name] [name] MRN: [id number] Date of Birth: [birth year]  Transition of Care [facility]) CM/SW Contact:    Ninfa Meeker, RN Phone Number: 06/13/2020, 3:02 PM  Clinical Narrative: Patient is a 65 y.o. female with history of ESRD, HTN, depression/anxiety-chronic hypoxic respiratory failure on 2.5 L of oxygen at home, presented with fever and weakness after dialysis. Case manager spoke with patient concerning recommendation for Home Health PT. Choices offered for [place], referral called to Adela Lank, [facility] Liaison. Patient has family support at home, no DME needs. TOC Team will continue to monitor.          Expected Discharge Plan: [place] Barriers to Discharge: Continued Medical Work up   Patient Goals and CMS Choice Patient states their goals for this hospitalization and ongoing recovery are:: get better CMS Medicare.gov Compare Post Acute Care list provided to:: Patient Choice offered to / list presented to : Patient  Expected Discharge Plan and Services Expected Discharge Plan: [place]   Discharge Planning Services: CM Consult Post Acute Care Choice: [place] arrangements for the past 2 months: Single Family Home                 DME Arranged: N/A DME Agency: NA       HH Arranged: PT HH Agency: [place] Date [facility] Agency Contacted: 06/13/20 Time HH Agency Contacted: [id number] Representative spoke with at [place]: Adela Lank  Prior Living Arrangements/Services Living arrangements for the past 2 months: [place] Lives with:: Adult Children Patient language and need for interpreter reviewed:: Yes Do you feel safe going back to the place where you live?: Yes      Need for Family Participation in Patient Care: Yes (Comment) Care giver support system in place?: Yes (comment)   Criminal Activity/Legal  Involvement Pertinent to Current Situation/Hospitalization: No - Comment as needed  Activities of Daily Living Home Assistive Devices/Equipment: None ADL Screening (condition at time of admission) Patient's cognitive ability adequate to safely complete daily activities?: Yes Is the patient deaf or have difficulty hearing?: No Does the patient have difficulty seeing, even when wearing glasses/contacts?: No Does the patient have difficulty concentrating, remembering, or making decisions?: No Patient able to express need for assistance with ADLs?: No Does the patient have difficulty dressing or bathing?: No Independently performs ADLs?: Yes (appropriate for developmental age) Does the patient have difficulty walking or climbing stairs?: No Weakness of Legs: Left Weakness of Arms/Hands: None  Permission Sought/Granted Permission sought to share information with : Case Manager       Permission granted to share info w AGENCY: [id number]        Emotional Assessment   Attitude/Demeanor/Rapport: Gracious   Orientation: : Oriented to Self,Oriented to Place,Oriented to  Time,Oriented to Situation Alcohol / Substance Use: Not Applicable Psych Involvement: No (comment)  Admission diagnosis:  Sepsis due to undetermined organism ([place]) [A41.9] Sepsis, due to unspecified organism, unspecified whether acute organ dysfunction present [facility]) [A41.9] Patient Active Problem List   Diagnosis Date Noted  . Sepsis due to pneumonia ([place]) 06/11/2020  . Chronic pain disorder 06/11/2020  . Depression with anxiety 06/11/2020  . Shortness of breath 01/05/2020  . Cough 01/05/2020  . Abnormal findings on diagnostic imaging of lung 01/05/2020  . Chronic respiratory failure with hypoxia ([place]) 01/05/2020  . Peripheral neuropathy 09/22/2019  . Tumor  07/28/2019  . History of seizures 07/28/2019  . Tremor 07/28/2019  . Stress incontinence 12/24/2017  . Cough variant asthma 12/24/2017  . Tremor of left hand  12/24/2017  . History of internal jugular thrombosis 03/26/2017  . 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 Anemia 07/08/2016  . Abnormal brain MRI 07/03/2016  . Fasciculation 06/18/2016  . Carotid artery disease ([place])   . Encounter for screening for COVID-19 01/28/2016  . Hyperlipidemia 06/28/2015  . Diabetes mellitus type II, controlled ([place]) 06/28/2015  . Thoracic ascending aortic aneurysm (41 mm on Echo 06/2013) 07/06/2013  . ESRD on dialysis ([place]) 06/16/2013  . History of renal transplant 06/16/2013  . GERD (gastroesophageal reflux disease) 08/13/2009  . OTH&UNSPEC NONINFECTIOUS GASTROENTERITIS&COLITIS 08/13/2009  . Essential hypertension 04/25/2009  . Hemorrhoids 04/25/2009  . WEIGHT LOSS 04/25/2009  . NAUSEA AND VOMITING 04/25/2009  . PERSONAL HX COLONIC POLYPS 04/25/2009   PCP:  Midge Minium, MD Pharmacy:   [id number] [name], [place] [address] Phone: [phone] Fax: [phone]  [facility] [place], [place] - [address] [place], [place] [address] Phone: [phone] Fax: [phone]     Social Determinants of Health (SDOH) Interventions    Readmission Risk Interventions No flowsheet data found.

## 2020-06-13 NOTE — Progress Notes (Signed)
Max recommended dose for Lyrica is 75mg /day in ESRD. She was on 300mg /day prior to admission. D/w Dr. Sloan Leiter, ok to reduce to 75mg  PO qday.  Onnie Boer, PharmD, BCIDP, AAHIVP, CPP Infectious Disease Pharmacist 06/13/2020 12:47 PM

## 2020-06-14 ENCOUNTER — Inpatient Hospital Stay (HOSPITAL_COMMUNITY): Payer: Medicare HMO

## 2020-06-14 DIAGNOSIS — Z992 Dependence on renal dialysis: Secondary | ICD-10-CM

## 2020-06-14 DIAGNOSIS — N186 End stage renal disease: Secondary | ICD-10-CM

## 2020-06-14 LAB — CBC
HCT: 25.1 % — ABNORMAL LOW (ref 36.0–46.0)
Hemoglobin: 8.3 g/dL — ABNORMAL LOW (ref 12.0–15.0)
MCH: 32.3 pg (ref 26.0–34.0)
MCHC: 33.1 g/dL (ref 30.0–36.0)
MCV: 97.7 fL (ref 80.0–100.0)
Platelets: 96 10*3/uL — ABNORMAL LOW (ref 150–400)
RBC: 2.57 MIL/uL — ABNORMAL LOW (ref 3.87–5.11)
RDW: 13.4 % (ref 11.5–15.5)
WBC: 7.8 10*3/uL (ref 4.0–10.5)
nRBC: 0 % (ref 0.0–0.2)

## 2020-06-14 LAB — AMMONIA: Ammonia: 15 umol/L (ref 9–35)

## 2020-06-14 LAB — RENAL FUNCTION PANEL
Albumin: 2.9 g/dL — ABNORMAL LOW (ref 3.5–5.0)
Albumin: 2.9 g/dL — ABNORMAL LOW (ref 3.5–5.0)
Anion gap: 19 — ABNORMAL HIGH (ref 5–15)
Anion gap: 13 (ref 5–15)
BUN: 77 mg/dL — ABNORMAL HIGH (ref 8–23)
BUN: 26 mg/dL — ABNORMAL HIGH (ref 8–23)
CO2: 23 mmol/L (ref 22–32)
CO2: 25 mmol/L (ref 22–32)
Calcium: 7.7 mg/dL — ABNORMAL LOW (ref 8.9–10.3)
Calcium: 8.4 mg/dL — ABNORMAL LOW (ref 8.9–10.3)
Chloride: 92 mmol/L — ABNORMAL LOW (ref 98–111)
Chloride: 95 mmol/L — ABNORMAL LOW (ref 98–111)
Creatinine, Ser: 14.17 mg/dL — ABNORMAL HIGH (ref 0.44–1.00)
Creatinine, Ser: 7 mg/dL — ABNORMAL HIGH (ref 0.44–1.00)
GFR, Estimated: 3 mL/min — ABNORMAL LOW (ref >60)
GFR, Estimated: 6 mL/min — ABNORMAL LOW (ref >60)
Glucose, Bld: 108 mg/dL — ABNORMAL HIGH (ref 70–99)
Glucose, Bld: 104 mg/dL — ABNORMAL HIGH (ref 70–99)
Phosphorus: 10.3 mg/dL — ABNORMAL HIGH (ref 2.5–4.6)
Phosphorus: 4.7 mg/dL — ABNORMAL HIGH (ref 2.5–4.6)
Potassium: 3.9 mmol/L (ref 3.5–5.1)
Potassium: 3.7 mmol/L (ref 3.5–5.1)
Sodium: 134 mmol/L — ABNORMAL LOW (ref 135–145)
Sodium: 133 mmol/L — ABNORMAL LOW (ref 135–145)

## 2020-06-14 LAB — HEPATITIS B SURFACE ANTIGEN: Hepatitis B Surface Ag: NONREACTIVE

## 2020-06-14 MED ORDER — SODIUM CHLORIDE 0.9 % IV SOLN: 2.0000 g | INTRAVENOUS | Status: DC

## 2020-06-14 MED ORDER — SODIUM CHLORIDE 0.9 % IV SOLN
1.0000 g | Freq: Once | INTRAVENOUS | Status: DC
  Filled 2020-06-14: qty 1

## 2020-06-14 MED ORDER — DOXERCALCIFEROL 4 MCG/2ML IV SOLN
INTRAVENOUS | Status: AC
  Administered 2020-06-14: 7 ug via INTRAVENOUS
  Filled 2020-06-14: qty 4

## 2020-06-14 MED ORDER — SODIUM CHLORIDE 0.9 % IV SOLN
2.0000 g | Freq: Once | INTRAVENOUS | Status: AC
  Administered 2020-06-14: 2 g via INTRAVENOUS
  Filled 2020-06-14 (×2): qty 2

## 2020-06-14 MED ORDER — DOXERCALCIFEROL 4 MCG/2ML IV SOLN: 7.0000 ug | INTRAVENOUS | Status: AC

## 2020-06-14 NOTE — Procedures (Signed)
EEG Report Indication: possible seizure activity  The duration of the study was 22 minutes.  Electrodes were placed according to the International 10/20 system.  Video was reviewed/available for clinical correlation as needed.  This study was conducted during the waking/drowsy versus encephalopathic state.  There was  perhaps a very vague anterior-posteirior voltage and frequency gradient.  There were no other clear features of organization including a discernible posterior dominant rhythm or sleep architecture noted at any point.  The background was mainly delta/theta slowing, of moderate amplitude.  Clear reactivity was seen, with a state change from a period with faster alpha/theta and a vague gradient transitioning to another period with diffuse theta/delta slowing.    Hyperventilation: Deferred Photic stimulation: Unremarkable  There are no clear focal, paroxysmal or epileptiform abnormalities  or interhemispheric asymmetries.  Impression:  This is an abnormal waking/drowsy study due to markedly diminished versus absent organization consistent with a mild diffuse encephalopathy.  There are no clear focal or epileptiform abnormalities.

## 2020-06-14 NOTE — Progress Notes (Addendum)
[facility] Progress Note  Subjective: seen in room, no new c/o's. Feeling better. UE jerking better.   Vitals:   06/14/20 1100 06/14/20 1130 06/14/20 1140 06/14/20 1145  BP: (!) 169/69 (!) 153/89 (!) 172/85 (!) 171/101  Pulse:   90   Resp: 11 16 13 17   Temp:   98.3 F (36.8 C)   TempSrc:   Oral   SpO2:   95%   Weight:   91.7 kg   Height:        Exam: Gen alert, no distress No jvd or bruits Chest poor air movement in general, no rales/ wheezing RRR no MRG Abd soft ntnd no mass or ascites +bs Ext no LE edema Neuro is alert, Ox 3 , nf  LUA AVF +bruit, no erythema/ drainage   Home meds:  - norvasc 10/ hydralazine 100 bid/ isordil 20 tid/ sl ntg prn  - prilosec 40 qd/ auryxia 210 tid ac/ lipitor 40 qd  - cymbalta 20 qd/ lyrica 100 tid/ seroquel 200 bid/ trazodone 50 hs prn/ percocet prn  - prn's/ vitamins/ supplements    CXR no acute disease   OP HD: [place] MWF  3.5h  86kg  2/2 bath  Hep 2000  AVF  - hect 7ug tiw  - no esa  Assessment/ Plan: 1. Fever - high fever 103 in ED yest. No signs of access infection on exam. Fevers resolved. Pt completed short course of IV abx here. Blood cx's negative. Will not need any outpatient IV abx w/ HD.   2. Myoclonic jerking - have dc'd Lyrica and myoclonus still present post HD today.  May have uremic component. Still confused post HD today. Plan check NH3, repeat HD in am. Consider fistulogram if B/Cr not improving w/ HD. Have listed Lyrica as an intolerance, pt should avoid  Lyrica and gabapentin in the future.   3. ESRD - on HD MWF. HD today was for postponed Wed HD. Next HD tomorrow.  4. HTN/ volume - cont BP meds. Needs std weight. CXR is negative. No gross vol excess on exam. Wt's high but not sure they are accurate.  5. Depression - on multiple psych meds, per pmd 6. CAD - on nitrates and sl ntg prn 7. Anemia ckd - not on esa at OP unit 8. MBD ckd - cont vdra and binder Verlee Rossetti 06/14/2020,  12:06 PM   Recent Labs  Lab 06/13/20 0156 06/14/20 0117  K 3.8 3.9  BUN 66* 77*  CREATININE 13.09* 14.17*  CALCIUM 7.8* 7.7*  PHOS 9.8* 10.3*  HGB 8.7* 8.3*   Inpatient medications: . amLODipine  10 mg Oral Daily  . atorvastatin  40 mg Oral Daily  . Chlorhexidine Gluconate Cloth  6 each Topical Daily  . Chlorhexidine Gluconate Cloth  6 each Topical Q0600  . doxercalciferol  7 mcg Intravenous Q M,W,F-HD  . DULoxetine  20 mg Oral Daily  . ferric citrate  210 mg Oral TID with meals  . heparin  5,000 Units Subcutaneous Q8H  . hydrALAZINE  100 mg Oral BID  . isosorbide dinitrate  20 mg Oral TID  . multivitamin  1 tablet Oral BH-q7a  . pantoprazole  40 mg Oral Daily  . QUEtiapine  200 mg Oral BID  . sodium chloride flush  3 mL Intravenous Q12H   . ceFEPime (MAXIPIME) IV    . lactated ringers 10 mL/hr at 06/12/20 1017   acetaminophen **OR** acetaminophen, albuterol, calcium carbonate (dosed  in mg elemental calcium), camphor-menthol **AND** hydrOXYzine, docusate sodium, feeding supplement (NEPRO CARB STEADY), loratadine, ondansetron **OR** ondansetron (ZOFRAN) IV, sorbitol, traZODone, zolpidem

## 2020-06-14 NOTE — Progress Notes (Signed)
Pharmacy Antibiotic Note  [name] [name] is a 65 y.o. female admitted on 06/11/2020 with sepsis.  Pharmacy has been consulted for vancomycin and cefepime dosing.  Hx ESRD-HD usually MWF. HD session for Wednesday, 3/23 pushed to Thursday, 3/24.  Patient remains afebrile, WBC 11.7 >>7.8.  CXR not indicative of pneumonia.  Cultures so far no growth to date, MRSA PCR negative.  MD ok to discontinue vancomycin.    Plan: Discontinue vancomycin Cefepime 1g IV q24h (adjust to 2g IV qHD once back on regular schedule) Monitor HD schedule, Cx and clinical progression to narrow  Height: 5\' 6"  (167.6 cm) Weight: 82.6 kg (182 lb) IBW/kg (Calculated) : 59.3  Temp (24hrs), Avg:97.9 F (36.6 C), Min:97.6 F (36.4 C), Max:98 F (36.7 C)  Recent Labs  Lab 06/11/20 0710 06/11/20 0711 06/11/20 1007 06/12/20 0306 06/13/20 0156 06/14/20 0117  WBC 11.7*  --   --  11.2* 9.4 7.8  CREATININE 11.21*  --   --  11.53* 13.09* 14.17*  LATICACIDVEN  --  2.8* 1.7  --   --   --     Estimated Creatinine Clearance: 4.5 mL/min (A) (by C-G formula based on SCr of 14.17 mg/dL (H)).    Allergies  Allergen Reactions  . Lisinopril Shortness Of Breath, Swelling and Other (See Comments)    Throat irritation also. Patient takes losartan and tolerates fine.  [name] [Oxaprozin] Hives and Dermatitis   Microbiology Data 3/21 BCx (x2): ngtd x 3 days 3/22 MRSA PCR: negative  Antimicrobials Vancomycin 3/21 >> 3/24 Cefepime 3/21 >>  Dimple Nanas, PharmD PGY-1 Acute Care Pharmacy Resident 06/14/2020 8:01 AM

## 2020-06-14 NOTE — Discharge Summary (Signed)
PATIENT DETAILS Name: [name] [name] Age: 65 y.o. Sex: female Date of Birth: 03-13-1956 MRN: [id number]. Admitting Physician: Karmen Bongo, MD ALP:[id number], [name], MD  Admit Date: 06/11/2020 Discharge date: 06/14/2020  Recommendations for Outpatient Follow-up:  1. Follow up with PCP in 1-2 weeks 2. Please obtain CMP/CBC in one week 3. Avoid Lyrica in the future 4. Follow blood cultures until final   Admitted From:  Home   Disposition: Home with home health services   [place]: Yes  Equipment/Devices: None  Discharge Condition: Stable  CODE STATUS: FULL CODE  Diet recommendation:  Diet Order            Diet - low sodium heart healthy           Diet renal with fluid restriction Fluid restriction: 1200 mL Fluid; Room service appropriate? Yes; Fluid consistency: Thin  Diet effective now                 Brief Narrative: Patient is a 65 y.o. female with history of ESRD, HTN, depression/anxiety-chronic hypoxic respiratory failure on 2.5 L of oxygen at home, NMDA encephalitis (2018)-presented with fever and weakness after dialysis.  She was subsequently admitted to the hospitalist service.  See below for further details.  Significant events: 3/21>> admit for evaluation of fever/weakness post HD.  Significant studies: 3/21>> chest x-ray: No pneumonia  Microbiology data: 3/21>> blood culture: No growth 3/21>> influenza/Covid PCR: Negative  Antimicrobial therapy: Vancomycin: 3/21>> Cefepime: 3/21>> Flagyl: 3/21 x 1  Procedures : None  Consults: Renal   Brief Hospital Course: SIRS/fever: No obvious foci of infection apparent-blood cultures were negative-dialysis access site did not have any evidence of infection.  She was treated with vancomycin/cefepime-since no source of infection apparent-we will get 1 dose of cefepime on 3/24-probably does not require any further antimicrobial therapy on discharge.   Myoclonic jerks: Thought to  be due to Criss [place]vine was initially decreased-nephrology subsequently discontinued it altogether on 3/23.  Nephrology does not recommend resuming Lyrica in the future.  Myoclonic jerks are would be better-suspect will improve over the next few days.  PCP/primary nephrologist to continue to monitor.  ESRD on HD MWF: Nephrology followed closely-and directed HD care.  Normocytic anemia: Due to ESRD-defer Aranesp/IV iron to nephrology.  No evidence of GI loss at this point.  Thrombocytopenia: Mild-likely due to infectious issues-stable for close monitoring.  HTN: BP stable-continue Norvasc/hydralazine/Imdur.  HLD: Continue Lipitor  Depression/anxiety: Stable-continue Cymbalta/Seroquel/trazodone  Chronic hypoxic respiratory failure secondary to possible ILD: On 2.5 L of oxygen at all times.  Continue PCCM follow-up in the outpatient setting  Chronic pain syndrome: Stable-no longer on Lyrica-stable on Cymbalta.  History of NMDA encephalitis-s/p brain biopsy-followed by [facility] along the surgical drain site in 2018 ([facility])  Obesity: Estimated body mass index is 33.45 kg/m as calculated from the following:   Height as of this encounter: '5\' 6"'  (1.676 m).   Weight as of this encounter: 94 kg.    Discharge Diagnoses:  Principal Problem:   Sepsis due to pneumonia [facility]) Active Problems:   Essential hypertension   ESRD on dialysis ([place])   Hyperlipidemia   Chronic respiratory failure with hypoxia (HCC)   Chronic pain disorder   Depression with anxiety   Discharge Instructions:  Activity:  As tolerated   Discharge Instructions    Call MD for:  persistant nausea and vomiting   Complete by: As directed    Call MD for:  temperature >100.4   Complete  by: As directed    Diet - low sodium heart healthy   Complete by: As directed    Discharge instructions   Complete by: As directed    Follow with Primary MD  Midge Minium, MD in 1-2 weeks  Follow  with hemodialysis clinic-at your usual schedule.  Please get a complete blood count and chemistry panel checked by your Primary MD at your next visit, and again as instructed by your Primary MD.  Get Medicines reviewed and adjusted: Please take all your medications with you for your next visit with your Primary MD  Laboratory/radiological data: Please request your Primary MD to go over all hospital tests and procedure/radiological results at the follow up, please ask your Primary MD to get all Hospital records sent to his/her office.  In some cases, they will be blood work, cultures and biopsy results pending at the time of your discharge. Please request that your primary care M.D. follows up on these results.  Also Note the following: If you experience worsening of your admission symptoms, develop shortness of breath, life threatening emergency, suicidal or homicidal thoughts you must seek medical attention immediately by calling 911 or calling your MD immediately  if symptoms less severe.  You must read complete instructions/literature along with all the possible adverse reactions/side effects for all the Medicines you take and that have been prescribed to you. Take any new Medicines after you have completely understood and accpet all the possible adverse reactions/side effects.   Do not drive when taking Pain medications or sleeping medications (Benzodaizepines)  Do not take more than prescribed Pain, Sleep and Anxiety Medications. It is not advisable to combine anxiety,sleep and pain medications without talking with your primary care practitioner  Special Instructions: If you have smoked or chewed Tobacco  in the last 2 yrs please stop smoking, stop any regular Alcohol  and or any Recreational drug use.  Wear Seat belts while driving.  Please note: You were cared for by a hospitalist during your hospital stay. Once you are discharged, your primary care physician will handle any further  medical issues. Please note that NO REFILLS for any discharge medications will be authorized once you are discharged, as it is imperative that you return to your primary care physician (or establish a relationship with a primary care physician if you do not have one) for your post hospital discharge needs so that they can reassess your need for medications and monitor your lab values.   Stop Lyrica-do not resume it in the future.   Increase activity slowly   Complete by: As directed      Allergies as of 06/14/2020      Reactions   Lisinopril Shortness Of Breath, Swelling, Other (See Comments)   Throat irritation also. Patient takes losartan and tolerates fine.   Daypro [oxaprozin] Hives, Dermatitis      Medication List    STOP taking these medications   loratadine 10 MG tablet Commonly known as: CLARITIN   oxyCODONE-acetaminophen 7.5-325 MG tablet Commonly known as: Percocet   pregabalin 100 MG capsule Commonly known as: LYRICA     TAKE these medications   acetaminophen 650 MG CR tablet Commonly known as: TYLENOL Take 650-1,300 mg by mouth every 8 (eight) hours as needed for pain.   albuterol 108 (90 Base) MCG/ACT inhaler Commonly known as: VENTOLIN HFA INHALE 2 PUFFS INTO THE LUNGS EVERY 4 (FOUR) HOURS AS NEEDED FOR WHEEZING OR SHORTNESS OF BREATH.   amLODipine 10 MG tablet  Commonly known as: NORVASC TAKE 1 TABLET BY MOUTH EVERY DAY   atorvastatin 40 MG tablet Commonly known as: LIPITOR Take 1 tablet (40 mg total) by mouth daily. Please make yearly appt with Dr. Acie Fredrickson for April 2022 for future refills. Thank you 1st attempt   blood glucose meter kit and supplies Kit Dispense based on patient and insurance preference. Patient should test sugars twice daily as directed. Dx. E11.9   cetirizine 10 MG tablet Commonly known as: ZYRTEC TAKE 1 TABLET BY MOUTH EVERY DAY   DULoxetine 20 MG capsule Commonly known as: CYMBALTA TAKE 1 CAPSULE BY MOUTH EVERY DAY What changed:  how much to take   ferric citrate 1 GM 210 MG(Fe) tablet Commonly known as: AURYXIA Take 210 mg by mouth See admin instructions. Take 210 mg by mouth three times a day with meals and 210 mg two times a day with snacks   hydrALAZINE 100 MG tablet Commonly known as: APRESOLINE Take 1 tablet (100 mg total) by mouth 2 (two) times daily.   isosorbide dinitrate 20 MG tablet Commonly known as: ISORDIL TAKE 1 TABLET BY MOUTH THREE TIMES A DAY   MIRCERA IJ Mircera   multivitamin Tabs tablet Take 1 tablet by mouth every morning.   nitroGLYCERIN 0.4 MG SL tablet Commonly known as: NITROSTAT Place 0.4 mg under the tongue every 5 (five) minutes as needed for chest pain.   omeprazole 40 MG capsule Commonly known as: PRILOSEC TAKE 1 CAPSULE BY MOUTH EVERY DAY What changed: how much to take   OneTouch Ultra test strip Generic drug: glucose blood USE AS INSTRUCTED TO TEST SUGARS TWICE DAILY. DX. E11.9   QUEtiapine 200 MG tablet Commonly known as: SEROQUEL Take 200 mg by mouth 2 (two) times daily.   traZODone 50 MG tablet Commonly known as: DESYREL Take 50 mg by mouth at bedtime as needed for sleep.       Follow-up Information    Midge Minium, MD. Schedule an appointment as soon as possible for a visit in 1 week(s).   Specialty: Family Medicine Contact information: [address] [place] [employer] [place] [address] [phone]        Hemodialysis clinic Follow up.   Why: Follow at your usual schedule.             Allergies  Allergen Reactions  . Lisinopril Shortness Of Breath, Swelling and Other (See Comments)    Throat irritation also. Patient takes losartan and tolerates fine.  [name] [Oxaprozin] Hives and Dermatitis    Other Procedures/Studies: DG Chest Port 1 View  Result Date: 06/11/2020 CLINICAL DATA:  65 year old female with possible sepsis. Fever, dialysis. EXAM: PORTABLE CHEST 1 VIEW COMPARISON:  Portable chest 04/18/2020 and earlier. FINDINGS: Portable  AP semi upright view at 0742 hours. Stable left chest dialysis catheter. Lower lung volumes. Stable cardiomegaly and mediastinal contours. Stable trace fluid or thickening along the right minor fissure. Stable pulmonary vascularity without overt edema. No pneumothorax, consolidation. Paucity of bowel gas in the upper abdomen. No acute osseous abnormality identified. IMPRESSION: Lower lung volumes, otherwise stable chest with cardiomegaly and vascular  VAS US DUPLEX DIALYSIS ACCESS (AVF, AVG)  Result Date: 06/13/2020 DIALYSIS ACCESS Reason for Exam: Check for fluid collection around the LUA AVF. Access Site: Left Upper Extremity. Access Type: Basilic vein transposition 09-01-2017. 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 Comparison Study: 03-01-2019 Prior duplex dialysis  access was patent with low                   flow in the outflow vein proximally. Aneurysmal outflow vein                   in the mid to distal upper arm. Performing Technologist: Darlin Coco RDMS,RVT Supporting Technologist: Vonzell Schlatter  Examination Guidelines: A complete evaluation includes B-mode imaging, spectral Doppler, color Doppler, and power Doppler as needed of all accessible portions of each vessel. Unilateral testing is considered an integral part of a complete examination. Limited examinations for reoccurring indications may be performed as noted.  Findings: +--------------------+----------+-----------------+--------+ AVF                 PSV (cm/s)Flow Vol (mL/min)Comments +--------------------+----------+-----------------+--------+ Native artery inflow   130                              +--------------------+----------+-----------------+--------+ AVF Anastomosis        452           914                +--------------------+----------+-----------------+--------+  +------------+----------+-------------+----------+--------+ OUTFLOW VEINPSV (cm/s)Diameter (cm)Depth  (cm)Describe +------------+----------+-------------+----------+--------+ Prox UA        418        0.54               tortuous +------------+----------+-------------+----------+--------+ Mid UA         434        0.65               tortuous +------------+----------+-------------+----------+--------+ Dist UA        421                                    +------------+----------+-------------+----------+--------+  Diagnosing physician: Curt Jews MD Electronically signed by Curt Jews MD on 06/13/2020 at 2:11:39 PM.   --------------------------------------------------------------------------------   Final      TODAY-DAY OF DISCHARGE:  Subjective:   Lanee Chain today has no headache,no chest abdominal pain,no new weakness tingling or numbness, feels much better wants to go home today.   Objective:   Blood pressure (!) 169/69, pulse 81, temperature 97.9 F (36.6 C), temperature source Oral, resp. rate 11, height '5\' 6"'  (1.676 m), weight 94 kg, SpO2 97 %. No intake or output data in the 24 hours ending 06/14/20 1109 Filed Weights   06/11/20 0700 06/14/20 0740  Weight: 82.6 kg 94 kg    Exam: Awake Alert, Oriented *3, No new F.N deficits, Normal affect [place].AT,PERRAL Supple Neck,No JVD, No cervical lymphadenopathy appriciated.  Symmetrical Chest wall movement, Good air movement bilaterally, CTAB RRR,No Gallops,Rubs or new Murmurs, No Parasternal Heave +ve B.Sounds, Abd Soft, Non tender, No organomegaly appriciated, No rebound -guarding or rigidity. No Cyanosis, Clubbing or edema, No new Rash or bruise   PERTINENT RADIOLOGIC STUDIES: VAS US DUPLEX DIALYSIS ACCESS (AVF, AVG)  Result Date: 06/13/2020 DIALYSIS ACCESS Reason for Exam: Check for fluid collection around the LUA AVF. Access Site: Left Upper Extremity. Access Type: Basilic vein transposition 09-01-2017. 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 Comparison Study: 03-01-2019  Prior duplex dialysis access was patent with low  flow in the outflow vein proximally. Aneurysmal outflow vein                   in the mid to distal upper arm. Performing Technologist: Darlin Coco RDMS,RVT Supporting Technologist: Vonzell Schlatter  Examination Guidelines: A complete evaluation includes B-mode imaging, spectral Doppler, color Doppler, and power Doppler as needed of all accessible portions of each vessel. Unilateral testing is considered an integral part of a complete examination. Limited examinations for reoccurring indications may be performed as noted.  Findings: +--------------------+----------+-----------------+--------+ AVF                 PSV (cm/s)Flow Vol (mL/min)Comments +--------------------+----------+-----------------+--------+ Native artery inflow   130                              +--------------------+----------+-----------------+--------+ AVF Anastomosis        452           914                +--------------------+----------+-----------------+--------+  +------------+----------+-------------+----------+--------+ OUTFLOW VEINPSV (cm/s)Diameter (cm)Depth (cm)Describe +------------+----------+-------------+----------+--------+ Prox UA        418        0.54               tortuous +------------+----------+-------------+----------+--------+ Mid UA         434        0.65               tortuous +------------+----------+-------------+----------+--------+ Dist UA        421                                    +------------+----------+-------------+----------+--------+  Diagnosing physician: Curt Jews MD Electronically signed by Curt Jews MD on 06/13/2020 at 2:11:39 PM.   --------------------------------------------------------------------------------   Final      PERTINENT LAB RESULTS: CBC: Recent Labs    06/13/20 0156 06/14/20 0117  WBC 9.4 7.8  HGB 8.7* 8.3*  HCT 26.2* 25.1*  PLT 97* 96*   CMET CMP     Component Value  Date/Time   NA 134 (L) 06/14/2020 0117   NA 139 07/05/2019 0940   K 3.9 06/14/2020 0117   CL 92 (L) 06/14/2020 0117   CO2 23 06/14/2020 0117   GLUCOSE 108 (H) 06/14/2020 0117   BUN 77 (H) 06/14/2020 0117   BUN 22 07/05/2019 0940   CREATININE 14.17 (H) 06/14/2020 0117   CREATININE 4.46 (H) 03/26/2017 1627   CALCIUM 7.7 (L) 06/14/2020 0117   PROT 7.4 06/11/2020 0710   PROT 7.2 07/05/2019 0940   ALBUMIN 2.9 (L) 06/14/2020 0117   ALBUMIN 4.4 07/05/2019 0940   AST 18 06/11/2020 0710   ALT 15 06/11/2020 0710   ALKPHOS 103 06/11/2020 0710   BILITOT 0.6 06/11/2020 0710   BILITOT 0.3 07/05/2019 0940   GFRNONAA 3 (L) 06/14/2020 0117   GFRAA 7 (L) 09/01/2019 1819    GFR Estimated Creatinine Clearance: 4.8 mL/min (A) (by C-G formula based on SCr of 14.17 mg/dL (H)). No results for input(s): LIPASE, AMYLASE in the last 72 hours. No results for input(s): CKTOTAL, CKMB, CKMBINDEX, TROPONINI in the last 72 hours. Invalid input(s): POCBNP No results for input(s): DDIMER in the last 72 hours. No results for input(s): HGBA1C in the last 72 hours. No results for input(s): CHOL, HDL, LDLCALC, TRIG, CHOLHDL, LDLDIRECT in the  last 72 hours. No results for input(s): TSH, T4TOTAL, T3FREE, THYROIDAB in the last 72 hours.  Invalid input(s): FREET3 No results for input(s): VITAMINB12, FOLATE, FERRITIN, TIBC, IRON, RETICCTPCT in the last 72 hours. Coags: No results for input(s): INR in the last 72 hours.  Invalid input(s): PT Microbiology: Recent Results (from the past 240 hour(s))  Resp Panel by RT-PCR (Flu A&B, Covid) Nasopharyngeal Swab     Status: None   Collection Time: 06/11/20  7:12 AM   Specimen: Nasopharyngeal Swab; Nasopharyngeal(NP) swabs in vial transport medium  Result Value Ref Range Status   SARS Coronavirus 2 by RT PCR NEGATIVE NEGATIVE Final    Comment: (NOTE) SARS-CoV-2 target nucleic acids are NOT DETECTED.  The SARS-CoV-2 RNA is generally detectable in upper  respiratory specimens during the acute phase of infection. The lowest concentration of SARS-CoV-2 viral copies this assay can detect is 138 copies/mL. A negative result does not preclude SARS-Cov-2 infection and should not be used as the sole basis for treatment or other patient management decisions. A negative result may occur with  improper specimen collection/handling, submission of specimen other than nasopharyngeal swab, presence of viral mutation(s) within the areas targeted by this assay, and inadequate number of viral copies(<138 copies/mL). A negative result must be combined with clinical observations, patient history, and epidemiological information. The expected result is Negative.  Fact Sheet for Patients:  EntrepreneurPulse.com.au  Fact Sheet for Healthcare Providers:  IncredibleEmployment.be  This test is no t yet approved or cleared by the [place] FDA and  has been authorized for detection and/or diagnosis of SARS-CoV-2 by FDA under an Emergency Use Authorization (EUA). This EUA will remain  in effect (meaning this test can be used) for the duration of the COVID-19 declaration under Section 564(b)(1) of the Act, 21 U.S.C.section 360bbb-3(b)(1), unless the authorization is terminated  or revoked sooner.       Influenza A by PCR NEGATIVE NEGATIVE Final   Influenza B by PCR NEGATIVE NEGATIVE Final    Comment: (NOTE) The Xpert Xpress SARS-CoV-2/FLU/RSV plus assay is intended as an aid in the diagnosis of influenza from Nasopharyngeal swab specimens and should not be used as a sole basis for treatment. Nasal washings and aspirates are unacceptable for Xpert Xpress SARS-CoV-2/FLU/RSV testing.  Fact Sheet for Patients: EntrepreneurPulse.com.au  Fact Sheet for Healthcare Providers: IncredibleEmployment.be  This test is not yet approved or cleared by the [place] FDA and has been  authorized for detection and/or diagnosis of SARS-CoV-2 by FDA under an Emergency Use Authorization (EUA). This EUA will remain in effect (meaning this test can be used) for the duration of the COVID-19 declaration under Section 564(b)(1) of the Act, 21 U.S.C. section 360bbb-3(b)(1), unless the authorization is terminated or revoked.  Performed at [facility], [place] [address] [place], [place] [address]   Culture, blood (routine x 2)     Status: None (Preliminary result)   Collection Time: 06/11/20  7:16 AM   Specimen: BLOOD RIGHT FOREARM  Result Value Ref Range Status   Specimen Description BLOOD RIGHT FOREARM  Final   Special Requests   Final    BOTTLES DRAWN AEROBIC AND ANAEROBIC Blood Culture results may not be optimal due to an inadequate volume of blood received in culture bottles   Culture   Final    NO GROWTH 3 DAYS Performed at [facility], [place] [address], [place],  [address]    Report Status PENDING  Incomplete  Culture, blood (routine x  2)     Status: None (Preliminary result)   Collection Time: 06/11/20  7:30 AM   Specimen: BLOOD LEFT HAND  Result Value Ref Range Status   Specimen Description BLOOD LEFT HAND  Final   Special Requests   Final    AEROBIC BOTTLE ONLY Blood Culture results may not be optimal due to an inadequate volume of blood received in culture bottles   Culture   Final    NO GROWTH 3 DAYS Performed at [facility], [place] [address], [place], [place] [address]    Report Status PENDING  Incomplete  MRSA PCR Screening     Status: None   Collection Time: 06/12/20  6:28 AM   Specimen: Nasal Mucosa; Nasopharyngeal  Result Value Ref Range Status   MRSA by PCR NEGATIVE NEGATIVE Final    Comment:        The GeneXpert MRSA Assay (FDA approved for NASAL specimens only), is one component of a comprehensive MRSA colonization surveillance program. It is not intended to diagnose MRSA infection nor to guide or monitor treatment  for MRSA infections. Performed at [facility], [place] [address], [place], [place] [address]     FURTHER DISCHARGE INSTRUCTIONS:  Get Medicines reviewed and adjusted: Please take all your medications with you for your next visit with your Primary MD  Laboratory/radiological data: Please request your Primary MD to go over all hospital tests and procedure/radiological results at the follow up, please ask your Primary MD to get all Hospital records sent to his/her office.  In some cases, they will be blood work, cultures and biopsy results pending at the time of your discharge. Please request that your primary care M.D. goes through all the records of your hospital data and follows up on these results.  Also Note the following: If you experience worsening of your admission symptoms, develop shortness of breath, life threatening emergency, suicidal or homicidal thoughts you must seek medical attention immediately by calling 911 or calling your MD immediately  if symptoms less severe.  You must read complete instructions/literature along with all the possible adverse reactions/side effects for all the Medicines you take and that have been prescribed to you. Take any new Medicines after you have completely understood and accpet all the possible adverse reactions/side effects.   Do not drive when taking Pain medications or sleeping medications (Benzodaizepines)  Do not take more than prescribed Pain, Sleep and Anxiety Medications. It is not advisable to combine anxiety,sleep and pain medications without talking with your primary care practitioner  Special Instructions: If you have smoked or chewed Tobacco  in the last 2 yrs please stop smoking, stop any regular Alcohol  and or any Recreational drug use.  Wear Seat belts while driving.  Please note: You were cared for by a hospitalist during your hospital stay. Once you are discharged, your primary care physician will handle any further  medical issues. Please note that NO REFILLS for any discharge medications will be authorized once you are discharged, as it is imperative that you return to your primary care physician (or establish a relationship with a primary care physician if you do not have one) for your post hospital discharge needs so that they can reassess your need for medications and monitor your lab values.  Total Time spent coordinating discharge including counseling, education and face to face time equals 35 minutes.  SignedOren Binet 06/14/2020 11:09 AM

## 2020-06-14 NOTE — Progress Notes (Signed)
Pt arrived back from HD very confused. Was able to answer appropriately to who she is and where she is, but everything else was incorrect. Pt states that it is November 1993. Pt restless, pulling at leads and clothing, and trying to get out of bed. Sent chat to Dr. Sloan Leiter who stated he would come by to assess her.

## 2020-06-14 NOTE — Progress Notes (Signed)
EEG complete - results pending 

## 2020-06-14 NOTE — Progress Notes (Signed)
Much more confused after dialysis Still with some myoclonic jerks  Nephrologist-Dr. Jonnie Finner evaluated as well-we both feel that this is likely metabolic in nature.  Possibly due to uremia.  Patient to have repeat HD tomorrow-she does have a history of autoimmune encephalitis-hence will go ahead and get MRI brain/EEG.  If mental status does not improve post HD-May need to consult neurology at some point.  Hold discharge-updated patient's daughter [name] [name]

## 2020-06-15 ENCOUNTER — Inpatient Hospital Stay (HOSPITAL_COMMUNITY): Payer: Medicare HMO

## 2020-06-15 DIAGNOSIS — R4182 Altered mental status, unspecified: Secondary | ICD-10-CM

## 2020-06-15 LAB — RENAL FUNCTION PANEL
Albumin: 2.8 g/dL — ABNORMAL LOW (ref 3.5–5.0)
Anion gap: 13 (ref 5–15)
BUN: 30 mg/dL — ABNORMAL HIGH (ref 8–23)
CO2: 25 mmol/L (ref 22–32)
Calcium: 8.5 mg/dL — ABNORMAL LOW (ref 8.9–10.3)
Chloride: 95 mmol/L — ABNORMAL LOW (ref 98–111)
Creatinine, Ser: 8.11 mg/dL — ABNORMAL HIGH (ref 0.44–1.00)
GFR, Estimated: 5 mL/min — ABNORMAL LOW (ref >60)
Glucose, Bld: 118 mg/dL — ABNORMAL HIGH (ref 70–99)
Phosphorus: 5.4 mg/dL — ABNORMAL HIGH (ref 2.5–4.6)
Potassium: 3.7 mmol/L (ref 3.5–5.1)
Sodium: 133 mmol/L — ABNORMAL LOW (ref 135–145)

## 2020-06-15 LAB — CBC
HCT: 25.4 % — ABNORMAL LOW (ref 36.0–46.0)
Hemoglobin: 8.5 g/dL — ABNORMAL LOW (ref 12.0–15.0)
MCH: 32.6 pg (ref 26.0–34.0)
MCHC: 33.5 g/dL (ref 30.0–36.0)
MCV: 97.3 fL (ref 80.0–100.0)
Platelets: 92 10*3/uL — ABNORMAL LOW (ref 150–400)
RBC: 2.61 MIL/uL — ABNORMAL LOW (ref 3.87–5.11)
RDW: 13.2 % (ref 11.5–15.5)
WBC: 6 10*3/uL (ref 4.0–10.5)
nRBC: 0 % (ref 0.0–0.2)

## 2020-06-15 MED ORDER — DOXERCALCIFEROL 4 MCG/2ML IV SOLN
INTRAVENOUS | Status: AC
  Filled 2020-06-15: qty 4

## 2020-06-15 MED ORDER — POTASSIUM CHLORIDE 20 MEQ PO PACK
20.0000 meq | PACK | Freq: Once | ORAL | Status: AC
  Administered 2020-06-15: 20 meq via ORAL
  Filled 2020-06-15: qty 1

## 2020-06-15 MED ORDER — MAGNESIUM SULFATE 2 GM/50ML IV SOLN
2.0000 g | Freq: Once | INTRAVENOUS | Status: AC
  Administered 2020-06-15: 2 g via INTRAVENOUS
  Filled 2020-06-15: qty 50

## 2020-06-15 MED ORDER — RENA-VITE PO TABS
1.0000 | ORAL_TABLET | Freq: Every day | ORAL | Status: DC
  Administered 2020-06-16 – 2020-06-18 (×3): 1 via ORAL
  Filled 2020-06-15 (×3): qty 1

## 2020-06-15 NOTE — Progress Notes (Signed)
Gave report to dialysis. Transporter came to transport pt to dialysis. Notified telemetry that pt is going to dialysis. Will continue to monitor pt. Racheal Patches RN

## 2020-06-15 NOTE — Care Management Important Message (Signed)
Important Message  Patient Details  Name: [name] [name] MRN: [id number] Date of Birth: Apr 12, 1955   Medicare Important Message Given:  Yes - Important Message mailed due to current National Emergency   Verbal consent obtained due to current National Emergency  Relationship to patient: Child Contact Name: Katreece Call Date: 06/15/20  Time: 1334 Phone: [phone] Outcome: Spoke with contact Important Message mailed to: Patient address on file    Delorse Lek 06/15/2020, 1:34 PM

## 2020-06-15 NOTE — Progress Notes (Signed)
PROGRESS NOTE        PATIENT DETAILS Name: [name] [name] Age: 65 y.o. Sex: female Date of Birth: 1956-01-22 Admit Date: 06/11/2020 Admitting Physician Karmen Bongo, MD GUY:[id number], [name], MD  Brief Narrative: Patient is a 65 y.o. female with history of ESRD, HTN, depression/anxiety-chronic hypoxic respiratory failure on 2.5 L of oxygen at home, NMDA encephalitis (2018)-presented with fever and weakness after dialysis.   See below for further details.   Significant events: 3/21>> admit for evaluation of fever/weakness post HD.   Significant studies: 3/21>> chest x-ray: No pneumonia 3/24>> EEG: No seizures-d mild diffuse encephalopathy  Microbiology data: 3/21>> blood culture: No growth 3/21>> influenza/Covid PCR: Negative  Antimicrobial therapy: Vancomycin: 3/21 x1 Cefepime: 3/21>> 3/24 Flagyl: 3/21 x 1  Procedures : None  Consults: Renal  DVT Prophylaxis : heparin injection 5,000 Units Start: 06/11/20 1400   Subjective: Seen earlier during dialysis-mentation is better than yesterday-however she is still not as fluent as she was a few days ago.  Assessment/Plan: SIRS/fever: No obvious foci of infection apparent yet-blood cultures remain negative-off all antimicrobial therapy.    Doubt she had sepsis physiology on admission  Waxing/waning encephalopathy-myoclonic jerks: Per family-ongoing for the past several weeks-initially attributed to possible uremia-and Lyrica use.  Dialyzed frequently-Lyrica discontinued-however continues to have intermittent encephalopathy-and myoclonic jerks. Discussed with nephrology this morning-given no improvement with numerous HD's/discontinuing Lyrica-and given her prior history of autoimmune encephalitis-we will consult neurology formally.  MRI brain pending-EEG as above.  Await further recommendations from neurology.  ESRD on HD MWF: Nephrology following.  Normocytic anemia: Due to ESRD-defer  Aranesp/IV iron to nephrology.  No evidence of GI loss at this point.  Thrombocytopenia: Mild-likely due to infectious issues-stable for close monitoring.  HTN: BP stable-continue Norvasc/hydralazine/Imdur.  HLD: Continue Lipitor  Depression/anxiety: Stable-continue Cymbalta/Seroquel/trazodone  Chronic hypoxic respiratory failure secondary to possible ILD: On 2.5 L of oxygen at all times.  Continue PCCM follow-up in the outpatient setting  Chronic pain syndrome: Stable-Lyrica discontinued.  History of NMDA encephalitis-s/p brain biopsy-followed by SDH/IPH along the surgical drain site in 2018 ([facility])  Diet: Diet Order            Diet NPO time specified Except for: Sips with Meds  Diet effective midnight           Diet - low sodium heart healthy                  Code Status: Full code   Family Communication: [phone] updated on 3/25  Disposition Plan: Status is: Inpatient  Remains inpatient appropriate because:Inpatient level of care appropriate due to severity of illness    Dispo: The patient is from: Home              Anticipated d/c is to: Home              Patient currently is not medically stable to d/c.   Difficult to place patient No    Barriers to Discharge: Sepsis-unknown foci-remains on IV antibiotics-if remains stable-possible discharge in the next 1-2 days.  Antimicrobial agents: Anti-infectives (From admission, onward)   Start     Dose/Rate Route Frequency Ordered Stop   06/15/20 1800  ceFEPIme (MAXIPIME) 2 g in sodium chloride 0.9 % 100 mL IVPB  Status:  Discontinued        2 g  200 mL/hr over 30 Minutes Intravenous Every M-W-F 06/14/20 0756 06/14/20 1104   06/15/20 1200  ceFEPIme (MAXIPIME) 2 g in sodium chloride 0.9 % 100 mL IVPB  Status:  Discontinued        2 g 200 mL/hr over 30 Minutes Intravenous Every M-W-F (Hemodialysis) 06/13/20 1109 06/14/20 0756   06/14/20 1800  ceFEPIme (MAXIPIME) 1 g in  sodium chloride 0.9 % 100 mL IVPB  Status:  Discontinued        1 g 200 mL/hr over 30 Minutes Intravenous  Once 06/14/20 0756 06/14/20 1101   06/14/20 1400  ceFEPIme (MAXIPIME) 2 g in sodium chloride 0.9 % 100 mL IVPB        2 g 200 mL/hr over 30 Minutes Intravenous  Once 06/14/20 1101 06/14/20 1329   06/14/20 1200  ceFEPIme (MAXIPIME) 1 g in sodium chloride 0.9 % 100 mL IVPB  Status:  Discontinued        1 g 200 mL/hr over 30 Minutes Intravenous  Once 06/14/20 0739 06/14/20 0756   06/13/20 2000  ceFEPIme (MAXIPIME) 1 g in sodium chloride 0.9 % 100 mL IVPB  Status:  Discontinued        1 g 200 mL/hr over 30 Minutes Intravenous Every 24 hours 06/13/20 1109 06/14/20 0737   06/13/20 1200  vancomycin (VANCOREADY) IVPB 1000 mg/200 mL  Status:  Discontinued        1,000 mg 200 mL/hr over 60 Minutes Intravenous Every M-W-F (Hemodialysis) 06/11/20 0744 06/14/20 0756   06/12/20 0800  ceFEPIme (MAXIPIME) 1 g in sodium chloride 0.9 % 100 mL IVPB  Status:  Discontinued        1 g 200 mL/hr over 30 Minutes Intravenous Every 24 hours 06/11/20 0744 06/13/20 1109   06/11/20 0800  vancomycin (VANCOREADY) IVPB 1500 mg/300 mL  Status:  Discontinued        1,500 mg 150 mL/hr over 120 Minutes Intravenous  Once 06/11/20 0739 06/11/20 0743   06/11/20 0800  vancomycin (VANCOREADY) IVPB 2000 mg/400 mL        2,000 mg 200 mL/hr over 120 Minutes Intravenous  Once 06/11/20 0743 06/11/20 1104   06/11/20 0745  ceFEPIme (MAXIPIME) 2 g in sodium chloride 0.9 % 100 mL IVPB        2 g 200 mL/hr over 30 Minutes Intravenous  Once 06/11/20 0739 06/11/20 0904   06/11/20 0745  metroNIDAZOLE (FLAGYL) IVPB 500 mg        500 mg 100 mL/hr over 60 Minutes Intravenous  Once 06/11/20 0739 06/11/20 2956       Time spent: 25 minutes-Greater than 50% of this time was spent in counseling, explanation of diagnosis, planning of further management, and coordination of care.  MEDICATIONS: Scheduled Meds: . amLODipine  10 mg Oral  Daily  . atorvastatin  40 mg Oral Daily  . Chlorhexidine Gluconate Cloth  6 each Topical Daily  . Chlorhexidine Gluconate Cloth  6 each Topical Q0600  . doxercalciferol      . doxercalciferol  7 mcg Intravenous Q M,W,F-HD  . DULoxetine  20 mg Oral Daily  . ferric citrate  210 mg Oral TID with meals  . heparin  5,000 Units Subcutaneous Q8H  . hydrALAZINE  100 mg Oral BID  . isosorbide dinitrate  20 mg Oral TID  . multivitamin  1 tablet Oral BH-[id number]  . pantoprazole  40 mg Oral Daily  . QUEtiapine  200 mg Oral BID  . sodium chloride flush  3 mL  Intravenous Q12H   Continuous Infusions:  PRN Meds:.acetaminophen **OR** acetaminophen, albuterol, calcium carbonate (dosed in mg elemental calcium), camphor-menthol **AND** hydrOXYzine, docusate sodium, feeding supplement (NEPRO CARB STEADY), loratadine, ondansetron **OR** ondansetron (ZOFRAN) IV, sorbitol, traZODone, zolpidem   PHYSICAL EXAM: Vital signs: Vitals:   06/15/20 1030 06/15/20 1100 06/15/20 1130 06/15/20 1144  BP: (!) 157/85 (!) 161/77 (!) 140/95 (!) 163/89  Pulse: 95 70 80 78  Resp: 18 18 18 18   Temp:    97.8 F (36.6 C)  TempSrc:    Oral  SpO2:   97% 97%  Weight:    90.7 kg  Height:       Filed Weights   06/14/20 0740 06/14/20 1140 06/15/20 1144  Weight: 94 kg 91.7 kg 90.7 kg   Body mass index is 32.27 kg/m.   Gen Exam: More awake and alert compared to yesterday-still not fluent. HEENT:atraumatic, normocephalic Chest: B/L clear to auscultation anteriorly CVS:S1S2 regular Abdomen:soft non tender, non distended Extremities:no edema Neurology: Non focal Skin: no rash  I have personally reviewed following labs and imaging studies  LABORATORY DATA: CBC: Recent Labs  Lab 06/11/20 0710 06/12/20 0306 06/13/20 0156 06/14/20 0117 06/15/20 0017  WBC 11.7* 11.2* 9.4 7.8 6.0  NEUTROABS 9.9*  --   --   --   --   HGB 10.7* 8.5* 8.7* 8.3* 8.5*  HCT 33.2* 26.7* 26.2* 25.1* 25.4*  MCV 102.8* 101.1* 100.4* 97.7 97.3   PLT 118* 107* 97* 96* 92*    Basic Metabolic Panel: Recent Labs  Lab 06/12/20 0306 06/13/20 0156 06/14/20 0117 06/14/20 1508 06/15/20 0017  NA 139 137 134* 133* 133*  K 3.7 3.8 3.9 3.7 3.7  CL 95* 95* 92* 95* 95*  CO2 26 23 23 25 25   GLUCOSE 110* 127* 108* 104* 118*  BUN 53* 66* 77* 26* 30*  CREATININE 11.53* 13.09* 14.17* 7.00* 8.11*  CALCIUM 7.8* 7.8* 7.7* 8.4* 8.5*  PHOS  --  9.8* 10.3* 4.7* 5.4*    GFR: Estimated Creatinine Clearance: 8.2 mL/min (A) (by C-G formula based on SCr of 8.11 mg/dL (H)).  Liver Function Tests: Recent Labs  Lab 06/11/20 0710 06/13/20 0156 06/14/20 0117 06/14/20 1508 06/15/20 0017  AST 18  --   --   --   --   ALT 15  --   --   --   --   ALKPHOS 103  --   --   --   --   BILITOT 0.6  --   --   --   --   PROT 7.4  --   --   --   --   ALBUMIN 3.5 2.9* 2.9* 2.9* 2.8*   No results for input(s): LIPASE, AMYLASE in the last 168 hours. Recent Labs  Lab 06/14/20 1508  AMMONIA 15    Coagulation Profile: Recent Labs  Lab 06/11/20 0738  INR 1.1    Cardiac Enzymes: No results for input(s): CKTOTAL, CKMB, CKMBINDEX, TROPONINI in the last 168 hours.  BNP (last 3 results) No results for input(s): PROBNP in the last 8760 hours.  Lipid Profile: No results for input(s): CHOL, HDL, LDLCALC, TRIG, CHOLHDL, LDLDIRECT in the last 72 hours.  Thyroid Function Tests: No results for input(s): TSH, T4TOTAL, FREET4, T3FREE, THYROIDAB in the last 72 hours.  Anemia Panel: No results for input(s): VITAMINB12, FOLATE, FERRITIN, TIBC, IRON, RETICCTPCT in the last 72 hours.  Urine analysis:    Component Value Date/Time   COLORURINE YELLOW 06/12/2020 1600   APPEARANCEUR  CLOUDY (A) 06/12/2020 1600   LABSPEC 1.016 06/12/2020 1600   PHURINE 5.0 06/12/2020 1600   GLUCOSEU NEGATIVE 06/12/2020 1600   HGBUR SMALL (A) 06/12/2020 1600   BILIRUBINUR NEGATIVE 06/12/2020 1600   BILIRUBINUR negative 07/23/2017 1536   KETONESUR NEGATIVE 06/12/2020 1600    PROTEINUR >=300 (A) 06/12/2020 1600   UROBILINOGEN 0.2 07/23/2017 1536   UROBILINOGEN 0.2 09/16/2014 1330   NITRITE NEGATIVE 06/12/2020 1600   LEUKOCYTESUR LARGE (A) 06/12/2020 1600    Sepsis Labs: Lactic Acid, Venous    Component Value Date/Time   LATICACIDVEN 1.7 06/11/2020 1007    MICROBIOLOGY: Recent Results (from the past 240 hour(s))  Resp Panel by RT-PCR (Flu A&B, Covid) Nasopharyngeal Swab     Status: None   Collection Time: 06/11/20  7:12 AM   Specimen: Nasopharyngeal Swab; Nasopharyngeal(NP) swabs in vial transport medium  Result Value Ref Range Status   SARS Coronavirus 2 by RT PCR NEGATIVE NEGATIVE Final    Comment: (NOTE) SARS-CoV-2 target nucleic acids are NOT DETECTED.  The SARS-CoV-2 RNA is generally detectable in upper respiratory specimens during the acute phase of infection. The lowest concentration of SARS-CoV-2 viral copies this assay can detect is 138 copies/mL. A negative result does not preclude SARS-Cov-2 infection and should not be used as the sole basis for treatment or other patient management decisions. A negative result may occur with  improper specimen collection/handling, submission of specimen other than nasopharyngeal swab, presence of viral mutation(s) within the areas targeted by this assay, and inadequate number of viral copies(<138 copies/mL). A negative result must be combined with clinical observations, patient history, and epidemiological information. The expected result is Negative.  Fact Sheet for Patients:  EntrepreneurPulse.com.au  Fact Sheet for Healthcare Providers:  IncredibleEmployment.be  This test is no t yet approved or cleared by the [place] FDA and  has been authorized for detection and/or diagnosis of SARS-CoV-2 by FDA under an Emergency Use Authorization (EUA). This EUA will remain  in effect (meaning this test can be used) for the duration of the COVID-19 declaration under  Section 564(b)(1) of the Act, 21 U.S.C.section 360bbb-3(b)(1), unless the authorization is terminated  or revoked sooner.       Influenza A by PCR NEGATIVE NEGATIVE Final   Influenza B by PCR NEGATIVE NEGATIVE Final    Comment: (NOTE) The Xpert Xpress SARS-CoV-2/FLU/RSV plus assay is intended as an aid in the diagnosis of influenza from Nasopharyngeal swab specimens and should not be used as a sole basis for treatment. Nasal washings and aspirates are unacceptable for Xpert Xpress SARS-CoV-2/FLU/RSV testing.  Fact Sheet for Patients: EntrepreneurPulse.com.au  Fact Sheet for Healthcare Providers: IncredibleEmployment.be  This test is not yet approved or cleared by the [place] FDA and has been authorized for detection and/or diagnosis of SARS-CoV-2 by FDA under an Emergency Use Authorization (EUA). This EUA will remain in effect (meaning this test can be used) for the duration of the COVID-19 declaration under Section 564(b)(1) of the Act, 21 U.S.C. section 360bbb-3(b)(1), unless the authorization is terminated or revoked.  Performed at [facility], [place] [address] [place], [place] [address]   Culture, blood (routine x 2)     Status: None (Preliminary result)   Collection Time: 06/11/20  7:16 AM   Specimen: BLOOD RIGHT FOREARM  Result Value Ref Range Status   Specimen Description BLOOD RIGHT FOREARM  Final   Special Requests   Final    BOTTLES DRAWN AEROBIC AND ANAEROBIC Blood Culture results may not  be optimal due to an inadequate volume of blood received in culture bottles   Culture   Final    NO GROWTH 4 DAYS Performed at [facility], [place] [address] [place], [place] [address]    Report Status PENDING  Incomplete  Culture, blood (routine x 2)     Status: None (Preliminary result)   Collection Time: 06/11/20  7:30 AM   Specimen: BLOOD LEFT HAND  Result Value Ref Range Status   Specimen Description BLOOD LEFT HAND   Final   Special Requests   Final    AEROBIC BOTTLE ONLY Blood Culture results may not be optimal due to an inadequate volume of blood received in culture bottles   Culture   Final    NO GROWTH 4 DAYS Performed at [facility], [place] [address] [place], [place] [address]    Report Status PENDING  Incomplete  MRSA PCR Screening     Status: None   Collection Time: 06/12/20  6:28 AM   Specimen: Nasal Mucosa; Nasopharyngeal  Result Value Ref Range Status   MRSA by PCR NEGATIVE NEGATIVE Final    Comment:        The GeneXpert MRSA Assay (FDA approved for NASAL specimens only), is one component of a comprehensive MRSA colonization surveillance program. It is not intended to diagnose MRSA infection nor to guide or monitor treatment for MRSA infections. Performed at [facility], [place] [address], [place],  [address]     RADIOLOGY STUDIES/RESULTS: EEG adult  Result Date: 06/14/2020 Raenette Rover, MD     06/14/2020  9:01 PM EEG Report Indication: possible seizure activity The duration of the study was 22 minutes.  Electrodes were placed according to the International 10/20 system.  Video was reviewed/available for clinical correlation as needed. This study was conducted during the waking/drowsy versus encephalopathic state. There was  perhaps a very vague anterior-posteirior voltage and frequency gradient.  There were no other clear features of organization including a discernible posterior dominant rhythm or sleep architecture noted at any point.  The background was mainly delta/theta slowing, of moderate amplitude.  Clear reactivity was seen, with a state change from a period with faster alpha/theta and a vague gradient transitioning to another period with diffuse theta/delta slowing.   Hyperventilation: Deferred Photic stimulation: Unremarkable There are no clear focal, paroxysmal or epileptiform abnormalities  or interhemispheric asymmetries. Impression: This is an abnormal  waking/drowsy study due to markedly diminished versus absent organization consistent with a mild diffuse encephalopathy.  There are no clear focal or epileptiform abnormalities.     LOS: 4 days   Oren Binet, MD  [facility]    To contact the attending provider between 7A-7P or the covering provider during after hours 7P-7A, please log into the web site [email or web address] and access using universal [place] password for that web site. If you do not have the password, please call the hospital operator.  06/15/2020, 2:47 PM

## 2020-06-15 NOTE — Progress Notes (Signed)
Physical Therapy Treatment Patient Details Name: [name] [name] MRN: [id number] DOB: 02-14-1956 Today's Date: 06/15/2020    History of Present Illness Pt is a 65 y.o. female who presented 3/21 with weakness and SOB following her HD appointment. She was admitted with sepsis and suspected PNA, even though CXR was negative. Additionally pt with Waxing/waning encephalopathy-myoclonic jerks.  Pt had MRI on 06/15/20 that was negative for acute changes.  PMH: seizure, HTN, HLD, bil jugular vein thrombosis, NMDA encephalitis, SDH, ILD on 2.5L home O2, DM, depression/anxiety, and ESRD on MWF HD.    PT Comments    Pt making gradual progress. She was fatigued after HD and MRI, so did not feel up to doing stairs but did ambulate with min-mod A.  Does demonstrate decreased balance with gait with LOB drifting R/L in hall- recommend use of RW and assist.  No myoclonic jerks today.  Also, decreased safety awareness and attention at times.  Needs continued therapy and stair training (may need assist of2 on stairs first time for safety).    Follow Up Recommendations  Home health PT;Supervision for mobility/OOB     Equipment Recommendations  Rolling walker with 5" wheels    Recommendations for Other Services       Precautions / Restrictions Precautions Precautions: Fall Precaution Comments: sats, tremors/buckling    Mobility  Bed Mobility Overal bed mobility: Needs Assistance Bed Mobility: Supine to Sit;Sit to Supine     Supine to sit: Supervision Sit to supine: Supervision   General bed mobility comments: cues to wait for lines/leads    Transfers Overall transfer level: Needs assistance Equipment used: None Transfers: Sit to/from Stand Sit to Stand: Min guard         General transfer comment: Min guard for safety  Ambulation/Gait Ambulation/Gait assistance: Min assist;Mod assist Gait Distance (Feet): 80 Feet Assistive device: None;Rolling walker (2 wheeled) Gait  Pattern/deviations: Step-through pattern;Staggering right;Staggering left;Narrow base of support;Scissoring Gait velocity: decr   General Gait Details: Pt reports not using RW at home and started without RW. She was significantly staggering L/R, scissoring at times, and reaching for rail requiring mod A for balance.  Cued pt to stop and RN retrieved RW.  With RW needing min A for balance with drifting R/L and narrow BOS   Stairs             Wheelchair Mobility    Modified Rankin (Stroke Patients Only)       Balance Overall balance assessment: Needs assistance Sitting-balance support: No upper extremity supported;Feet supported Sitting balance-Leahy Scale: Good     Standing balance support: No upper extremity supported;During functional activity;Bilateral upper extremity supported Standing balance-Leahy Scale: Poor Standing balance comment: Pt with LOB without UE support requiring min-modA.  With RW requiring min guard to min A at times                            Cognition Arousal/Alertness: Awake/alert Behavior During Therapy: Impulsive Overall Cognitive Status: Impaired/Different from baseline Area of Impairment: Attention;Safety/judgement                   Current Attention Level: Sustained     Safety/Judgement: Decreased awareness of safety;Decreased awareness of deficits     General Comments: A&Ox4, but pt becomes distracted at times and requests for questions to be repeated.  Unsteady and required cues for RW use.      Exercises      General Comments General  comments (skin integrity, edema, etc.): Pt on 3 L O2 with sats 96-98%.  Son and daughter present. Educated on RW use, guarding with gait, and assist with stairs. Pt did not want to practice stairs - reports fatigued from HD and MRI.  Daughter asking about speech therapy as she felt her speech had been slowed/slurred - informed not ordered at this time but did note neurology consult  -encouraged to speak with MD as needed.   Pt requesting cup of ice - noted fluid restrictions - checked with RN who reports pt can have ice and got pt cup of ice.      Pertinent Vitals/Pain Pain Assessment: No/denies pain    Home Living                      Prior Function            PT Goals (current goals can now be found in the care plan section) Acute Rehab PT Goals Patient Stated Goal: to go home PT Goal Formulation: With patient/family Time For Goal Achievement: 06/26/20 Potential to Achieve Goals: Good Progress towards PT goals: Progressing toward goals    Frequency    Min 3X/week      PT Plan Current plan remains appropriate    Co-evaluation              AM-PAC PT "6 Clicks" Mobility   Outcome Measure  Help needed turning from your back to your side while in a flat bed without using bedrails?: None Help needed moving from lying on your back to sitting on the side of a flat bed without using bedrails?: None Help needed moving to and from a bed to a chair (including a wheelchair)?: A Little Help needed standing up from a chair using your arms (e.g., wheelchair or bedside chair)?: A Little Help needed to walk in hospital room?: A Little Help needed climbing 3-5 steps with a railing? : A Lot 6 Click Score: 19    End of Session Equipment Utilized During Treatment: Oxygen;Gait belt Activity Tolerance: Patient limited by fatigue Patient left: in bed;with call bell/phone within reach;with bed alarm set;with family/visitor present Nurse Communication: Mobility status PT Visit Diagnosis: Unsteadiness on feet (R26.81);Other abnormalities of gait and mobility (R26.89);Difficulty in walking, not elsewhere classified (R26.2)     Time: 1645-1710 PT Time Calculation (min) (ACUTE ONLY): 25 min  Charges:  $Gait Training: 8-22 mins $Therapeutic Activity: 8-22 mins                     Abran Richard, PT Acute Rehab Services Pager [phone] [facility]  [place] 06/15/2020, 5:25 PM

## 2020-06-15 NOTE — Progress Notes (Signed)
[facility] Progress Note  Subjective:  Seen in HD unit. UF goal 4L. Tolerating UF. No complaints.  Still some UE jerking   Vitals:   06/15/20 0900 06/15/20 0930 06/15/20 1000 06/15/20 1030  BP: (!) 141/79 (!) 143/74  (!) 157/85  Pulse: 73 75 75 95  Resp: 18 18 18 18   Temp:      TempSrc:      SpO2:      Weight:      Height:        Exam: Gen alert, no distress No jvd or bruits Chest poor air movement in general, no rales/ wheezing RRR no MRG Abd soft ntnd no mass or ascites +bs Ext no LE edema Neuro is alert, Ox 3 , nf  LUA AVF +bruit, no erythema/ drainage   Home meds:  - norvasc 10/ hydralazine 100 bid/ isordil 20 tid/ sl ntg prn  - prilosec 40 qd/ auryxia 210 tid ac/ lipitor 40 qd  - cymbalta 20 qd/ lyrica 100 tid/ seroquel 200 bid/ trazodone 50 hs prn/ percocet prn  - prn's/ vitamins/ supplements    CXR no acute disease   [facility] HD: [facility]  3.5h  86kg  2/2 bath  Hep 2000  AVF  - hect 7ug tiw  - no esa  Assessment/ Plan: 1. Fever - high fever 103 in ED on admission.  No signs of access infection on exam. Fevers resolved. Pt completed short course of IV abx here. Blood cx's negative. Will not need any outpatient IV abx w/ HD.   2. Myoclonic jerking - have dc'd Lyrica and myoclonus still present post HD. May have uremic component.  MS change noted. NH3 WNL.  Consider fistulogram if B/Cr not improving w/ HD. For IR fistulogram. Have listed Lyrica as an intolerance, pt should avoid  Lyrica and gabapentin in the future.   3. ESRD - on HD MWF. HD off schedule 3/24. Back on schedule today 3/25  4. HTN/ volume - cont BP meds. Needs std weight. CXR is negative. No gross vol excess on exam. Wt's high but not sure they are accurate.  5. Depression - on multiple psych meds, per pmd 6. CAD - on nitrates and sl ntg prn 7. Anemia ckd - not on esa at OP unit. Restart with next HD  8. MBD ckd - cont vdra and binder Claris Che PA-C [facility] 06/15/2020,10:51 AM    Recent Labs  Lab 06/14/20 0117 06/14/20 1508 06/15/20 0017  K 3.9 3.7 3.7  BUN 77* 26* 30*  CREATININE 14.17* 7.00* 8.11*  CALCIUM 7.7* 8.4* 8.5*  PHOS 10.3* 4.7* 5.4*  HGB 8.3*  --  8.5*   Inpatient medications: . amLODipine  10 mg Oral Daily  . atorvastatin  40 mg Oral Daily  . Chlorhexidine Gluconate Cloth  6 each Topical Daily  . Chlorhexidine Gluconate Cloth  6 each Topical Q0600  . doxercalciferol      . doxercalciferol  7 mcg Intravenous Q M,W,F-HD  . DULoxetine  20 mg Oral Daily  . ferric citrate  210 mg Oral TID with meals  . heparin  5,000 Units Subcutaneous Q8H  . hydrALAZINE  100 mg Oral BID  . isosorbide dinitrate  20 mg Oral TID  . multivitamin  1 tablet Oral BH-q7a  . pantoprazole  40 mg Oral Daily  . QUEtiapine  200 mg Oral BID  . sodium chloride flush  3 mL Intravenous Q12H  acetaminophen **OR** acetaminophen, albuterol, calcium carbonate (dosed in mg elemental calcium), camphor-menthol **AND** hydrOXYzine, docusate sodium, feeding supplement (NEPRO CARB STEADY), loratadine, ondansetron **OR** ondansetron (ZOFRAN) IV, sorbitol, traZODone, zolpidem

## 2020-06-15 NOTE — Progress Notes (Signed)
PT Cancellation Note  Patient Details Name: [name] [name] MRN: [id number] DOB: 03-17-1956   Cancelled Treatment:    Reason Eval/Treat Not Completed: Patient at procedure or test/unavailable. Pt in HD.   Lorriane Shire 06/15/2020, 8:11 AM   Lorrin Goodell, PT  Office # [phone] Pager [phone]

## 2020-06-15 NOTE — Consult Note (Signed)
Neurology Consultation  Reason for Consult: "Waxing and waning encephalopathy"  Referring Physician: Oren Binet, MD  CC: Acute AMS  History is obtained from: Chart, Patient and Family  HPI: [name] [name] is a 65 y.o. female hx of ESRD on dialysis, CAD, DM, HTN, HLD, anti-NMDA-R encephalitis in 2018, chronic pain syndrome, depression, anxiety and seizures who presented to the [facility] for generalized weakness after dialysis this morning. She completed roughly 4 hours of dialysis and afterwards was in the restroom when she felt weak with SOB. Patient was found to have fever upon presentation. Patient was being considered for D/C 3/24; however, patient became very confused after HD with persistent myoclonic jerks. Initially encephalopathic presentation was thought to be acute uremia worsened by Lyrica; however, patient remained delirious after repeat HD and discontinuing OP Lyrica.   She was hospitalized at [facility] in 2018 for an encephalitis which was diagnosed as anti-NMDA-receptor encephalitis and treated, to the best recollection of the patient's daughter, with PLEX and steroids. The patient was on a short course of steroids after leaving the hospital but is no longer prescribed such.  ROS: Does not endorse additional symptoms. However, unable to obtain detailed answers regarding symptoms due to patient's poor memory and concentration.    Past Medical History:  Diagnosis Date  . Adenomatous colon polyp 04/1998  . Anemia   . Anxiety   . Arthritis    HIp  . Carotid artery disease ([place])    Carotid US 1/18: bilateral ICA 1-39, R thyroid lobe nodule (1.9x2.2x3cm); numerous L thyroid lobe nodules - repeat 1 year  . Depression   . Diabetes mellitus    diet controlled  . EBV infection    Per MPN  . Encephalitis    NMDA per [facility]  . End-stage renal disease (ESRD) ([facility])    hx of renal transplant failed. [address] [facility].   . Esophagitis    Grade 1 Distal  . GERD  (gastroesophageal reflux disease)   . Hemorrhoids   . History of blood transfusion   . History of kidney stones    Passed  . History of subdural hematoma   . Hyperkalemia   . Hyperlipidemia   . Hypertension   . Hypomagnesemia   . Left jugular vein thrombosis    Partial resolved per [facility]  . Metabolic acidosis   . On home oxygen therapy    2.5 liters  . Pneumonia   . Right jugular vein thrombosis    resolved from [facility]  . Seizures ([place])      Family History  Problem Relation Age of Onset  . Kidney disease Paternal Aunt   . Heart disease Mother   . Heart disease Father   . Colon cancer Neg Hx     Social History:   reports that she has quit smoking. She has never used smokeless tobacco. She reports previous alcohol use. She reports that she does not use drugs.  Medications  Current Facility-Administered Medications:  .  acetaminophen (TYLENOL) tablet 650 mg, 650 mg, Oral, Q6H PRN **OR** acetaminophen (TYLENOL) suppository 650 mg, 650 mg, Rectal, Q6H PRN, Karmen Bongo, MD .  albuterol (VENTOLIN HFA) 108 (90 Base) MCG/ACT inhaler 2 puff, 2 puff, Inhalation, Q4H PRN, Karmen Bongo, MD .  amLODipine (NORVASC) tablet 10 mg, 10 mg, Oral, Daily, Karmen Bongo, MD, 10 mg at 06/15/20 1223 .  atorvastatin (LIPITOR) tablet 40 mg, 40 mg, Oral, Daily, Karmen Bongo, MD, 40 mg at 06/15/20 1224 .  calcium carbonate (dosed  in mg elemental calcium) suspension 500 mg of elemental calcium, 500 mg of elemental calcium, Oral, Q6H PRN, Karmen Bongo, MD .  camphor-menthol [facility]) lotion 1 application, 1 application, Topical, D6Q PRN **AND** hydrOXYzine (ATARAX/VISTARIL) tablet 25 mg, 25 mg, Oral, Q8H PRN, Karmen Bongo, MD .  Chlorhexidine Gluconate Cloth 2 % PADS 6 each, 6 each, Topical, Q0600, Roney Jaffe, MD, 6 each at 06/15/20 1224 .  docusate sodium (ENEMEEZ) enema 283 mg, 1 enema, Rectal, PRN, Karmen Bongo, MD .  doxercalciferol (HECTOROL) 4 MCG/2ML injection, , , ,  .   doxercalciferol (HECTOROL) injection 7 mcg, 7 mcg, Intravenous, Q M,W,F-HD, Roney Jaffe, MD, 7 mcg at 06/15/20 0806 .  DULoxetine (CYMBALTA) DR capsule 20 mg, 20 mg, Oral, Daily, Karmen Bongo, MD, 20 mg at 06/15/20 1223 .  feeding supplement (NEPRO CARB STEADY) liquid 237 mL, 237 mL, Oral, TID PRN, Karmen Bongo, MD .  ferric citrate (AURYXIA) tablet 210 mg, 210 mg, Oral, TID with meals, Karmen Bongo, MD, 210 mg at 06/15/20 1234 .  heparin injection 5,000 Units, 5,000 Units, Subcutaneous, Q8H, Karmen Bongo, MD, 5,000 Units at 06/15/20 1234 .  hydrALAZINE (APRESOLINE) tablet 100 mg, 100 mg, Oral, BID, Karmen Bongo, MD, 100 mg at 06/15/20 1224 .  isosorbide dinitrate (ISORDIL) tablet 20 mg, 20 mg, Oral, TID, Karmen Bongo, MD, 20 mg at 06/15/20 1223 .  loratadine (CLARITIN) tablet 10 mg, 10 mg, Oral, Daily PRN, Karmen Bongo, MD .  Derrill Memo ON 06/16/2020] multivitamin (RENA-VIT) tablet 1 tablet, 1 tablet, Oral, QHS, Ghimire, Shanker M, MD .  ondansetron (ZOFRAN) tablet 4 mg, 4 mg, Oral, Q6H PRN **OR** ondansetron (ZOFRAN) injection 4 mg, 4 mg, Intravenous, Q6H PRN, Karmen Bongo, MD .  pantoprazole (PROTONIX) EC tablet 40 mg, 40 mg, Oral, Daily, Karmen Bongo, MD, 40 mg at 06/15/20 1223 .  QUEtiapine (SEROQUEL) tablet 200 mg, 200 mg, Oral, BID, Karmen Bongo, MD, 200 mg at 06/15/20 1224 .  sodium chloride flush (NS) 0.9 % injection 3 mL, 3 mL, Intravenous, Q12H, Karmen Bongo, MD, 3 mL at 06/15/20 1225 .  sorbitol 70 % solution 30 mL, 30 mL, Oral, PRN, Karmen Bongo, MD .  traZODone (DESYREL) tablet 50 mg, 50 mg, Oral, QHS PRN, Karmen Bongo, MD .  zolpidem (AMBIEN) tablet 5 mg, 5 mg, Oral, QHS PRN, Karmen Bongo, MD   Exam: Current vital signs: BP 132/72 (BP Location: Right Arm)   Pulse 83   Temp 98.4 F (36.9 C) (Oral)   Resp 20   Ht 5\' 6"  (1.676 m)   Wt 90.7 kg   SpO2 99%   BMI 32.27 kg/m  Vital signs in last 24 hours: Temp:  [97.7 F (36.5 C)-98.8 F  (37.1 C)] 98.4 F (36.9 C) (03/25 1454) Pulse Rate:  [68-95] 83 (03/25 1454) Resp:  [12-20] 20 (03/25 1454) BP: (122-163)/(71-95) 132/72 (03/25 1454) SpO2:  [95 %-99 %] 99 % (03/25 1454) Weight:  [90.7 kg] 90.7 kg (03/25 1144)  HEENT: - Normocephalic and atraumatic LUNGS - Normal respiratory effort.  Ext: warm, well perfused  Neurological exam: Mental Status: Awake and alert. Oriented to the month and year but not the day. Oriented to the state. Knows that she is in the hospital. Good attention, but concentration is impaired. Unable to add 5 + 3 or give the amount of change received when paying for a 1.75 item with 2 dollars. Can recite the months of the year forwards without difficulty, but cannot get past November when reciting backwards. Can name a  thumb but has difficulty naming a pinky finger and cannot name an index finger. Speech is fluent. Comprehension intact for basic commands. Pleasant and cooperative. No alterations in level of consciousness noted.  Cranial Nerves: II:  Visual fields intact with no extinction to DSS.  III,IV, VI: EOMI. No nystagmus.  V,VII: Smile symmetric, facial temp sensation decreased on the left.  VIII: hearing intact to voice IX,X: No hypophonia XI: Symmetric XII: Midline tongue extension  Motor: Right : Upper extremity   5/5    Left:     Upper extremity   4+/5  Lower extremity   5/5     Lower extremity   5/5 Normal tone throughout; no atrophy noted Mild asterixis noted to BUE with arms outstretched Sensory: Decreased temp sensation to RUE, normal to LUE and BLE. FT intact x 4. No extinction to DSS.  Deep Tendon Reflexes:  1+ bilateral brachioradialis and biceps Trace right patellar 1+ left patellar 0 achilles bilaterally  Cerebellar: No ataxia with FNF bilaterally Gait: Deferred   Labs I have reviewed labs in epic and the results pertinent to this consultation are:   CBC    Component Value Date/Time   WBC 6.0 06/15/2020 0017   RBC 2.61  (L) 06/15/2020 0017   HGB 8.5 (L) 06/15/2020 0017   HCT 25.4 (L) 06/15/2020 0017   PLT 92 (L) 06/15/2020 0017   MCV 97.3 06/15/2020 0017   MCH 32.6 06/15/2020 0017   MCHC 33.5 06/15/2020 0017   RDW 13.2 06/15/2020 0017   LYMPHSABS 0.9 06/11/2020 0710   MONOABS 0.6 06/11/2020 0710   EOSABS 0.1 06/11/2020 0710   BASOSABS 0.1 06/11/2020 0710    CMP     Component Value Date/Time   NA 133 (L) 06/15/2020 0017   NA 139 07/05/2019 0940   K 3.7 06/15/2020 0017   CL 95 (L) 06/15/2020 0017   CO2 25 06/15/2020 0017   GLUCOSE 118 (H) 06/15/2020 0017   BUN 30 (H) 06/15/2020 0017   BUN 22 07/05/2019 0940   CREATININE 8.11 (H) 06/15/2020 0017   CREATININE 4.46 (H) 03/26/2017 1627   CALCIUM 8.5 (L) 06/15/2020 0017   PROT 7.4 06/11/2020 0710   PROT 7.2 07/05/2019 0940   ALBUMIN 2.8 (L) 06/15/2020 0017   ALBUMIN 4.4 07/05/2019 0940   AST 18 06/11/2020 0710   ALT 15 06/11/2020 0710   ALKPHOS 103 06/11/2020 0710   BILITOT 0.6 06/11/2020 0710   BILITOT 0.3 07/05/2019 0940   GFRNONAA 5 (L) 06/15/2020 0017   GFRAA 7 (L) 09/01/2019 1819    Lipid Panel     Component Value Date/Time   CHOL 222 (H) 07/07/2019 1057   CHOL 204 (H) 07/05/2019 0940   TRIG 146.0 07/07/2019 1057   HDL 41.10 07/07/2019 1057   HDL 40 07/05/2019 0940   CHOLHDL 5 07/07/2019 1057   VLDL 29.2 07/07/2019 1057   LDLCALC 151 (H) 07/07/2019 1057   LDLCALC 135 (H) 07/05/2019 0940   LDLCALC 105 (H) 03/26/2017 1627   LDLDIRECT 91.0 01/28/2016 1054     Imaging I have reviewed the images obtained:   MR BRAIN WO CONTRAST  Result Date: 06/15/2020 CLINICAL DATA:  Mental status change. EXAM: MRI HEAD WITHOUT CONTRAST TECHNIQUE: Multiplanar, multiecho pulse sequences of the brain and surrounding structures were obtained without intravenous contrast. COMPARISON:  MRI of the brain February 22, 2019. FINDINGS: Brain: No acute infarction, hemorrhage, hydrocephalus, extra-axial collection or mass lesion. Small area of  encephalomalacia and gliosis in the right frontal  lobe at the site of prior biopsy. There is small herniation of brain tissue in this location into the burr hole, unchanged. Scattered and confluent foci of T2 hyperintensity are seen within the white matter of the cerebral hemispheres, nonspecific, not significantly changed from prior MRI. Vascular: Normal flow voids. Skull and upper cervical spine: Diffuse decrease of the T1 signal within the calvarium may be related to red marrow reconversion in the setting of anemia. Burr hole in the right frontal region. Sinuses/Orbits: Negative. IMPRESSION: 1. No acute intracranial abnormality. 2. Stable small area of encephalomalacia and gliosis in the right frontal lobe at the site of prior biopsy. 3. Scattered and confluent foci of T2 hyperintensity within the white matter of the cerebral hemispheres, nonspecific, but not significantly changed from prior MRI. Electronically Signed   By: Pedro Earls M.D.   On: 06/15/2020 15:50   EEG adult  Result Date: 06/14/2020 Raenette Rover, MD     06/14/2020  9:01 PM EEG Report Indication: possible seizure activity The duration of the study was 22 minutes.  Electrodes were placed according to the International 10/20 system.  Video was reviewed/available for clinical correlation as needed. This study was conducted during the waking/drowsy versus encephalopathic state. There was  perhaps a very vague anterior-posteirior voltage and frequency gradient.  There were no other clear features of organization including a discernible posterior dominant rhythm or sleep architecture noted at any point.  The background was mainly delta/theta slowing, of moderate amplitude.  Clear reactivity was seen, with a state change from a period with faster alpha/theta and a vague gradient transitioning to another period with diffuse theta/delta slowing.   Hyperventilation: Deferred Photic stimulation: Unremarkable There are no clear  focal, paroxysmal or epileptiform abnormalities  or interhemispheric asymmetries. Impression: This is an abnormal waking/drowsy study due to markedly diminished versus absent organization consistent with a mild diffuse encephalopathy.  There are no clear focal or epileptiform abnormalities.    Assessment: 65 y.o. female with hx of ESRD on dialysis, CAD, DM, HTN, HLD, anti-NMDA receptor encephalitis in 2018, chronic pain syndrome, depression, anxiety and seizures who presents with confusion and jerking movements of her limbs. Patient also has a history of SDH/IPH along the surgical drain site post biopsy in 2018 at the time of her autoimmune encephalitis workup. - Exam reveals mild confusion but no evidence for agitation or altered sensorium. Mild LUE weakness noted. Mild asterixis is suggestive of a mild metabolic encephalopathy.  - EEG was consistent with encephalopathy and did not note any electrographic seizure activity nor activity that suggest patient is at increased risk of seizure.  - MRI brain: No acute intracranial abnormality. Stable small area of encephalomalacia and gliosis in the right frontal lobe at the site of prior biopsy. Scattered and confluent foci of T2 hyperintensity within the white matter of the cerebral hemispheres, nonspecific, but not significantly changed from prior MRI. - Patient was treated with antimicrobial therapy including vancomycin and cefepime for a 4 day course and 1 dose of Flagyl 2/2 to fever with infection of unknown source. Patient UCx was negative and Bcx resulted in ngtd, thus decreasing concern that patient's AMS is 2/2 infection.  However, cefepime toxicity may be associated with encephalopathy.  - Overall exam findings and history do not militate in favor of a recurrence of anti-NMDA receptor encephalitis.  - The adventitious movements described by family are not strongly suspicious for seizure. EEG also shows no seizure activity or seizure predisposition.  -  Overall  impression is that the patient's presentation is due to mild uremic encephalopathy +/- toxic encephalopathy secondary to excess levels of pregabalin and/or its metabolites, which can result in asterixis and confusion   Recommendations: - Continue to hold Lyrica - Avoid sedating medications, especially those that are renally cleared - Scheduled neuro checks - Continue to observe for improvement with dialysis  Damita Dunnings, MD PGY-1  I have seen and examined the patient. I have formulated the assessment and recommendations. 65 y.o. female with hx of ESRD on dialysis, CAD, DM, HTN, HLD, anti-NMDA receptor encephalitis in 2018, chronic pain syndrome, depression, anxiety and seizures who presents with confusion and jerking movements of her limbs. Exam reveals mild confusion but no evidence for agitation or altered sensorium. Mild LUE weakness noted. Mild asterixis is suggestive of a mild metabolic encephalopathy. Recommendations as above.  Electronically signed: Dr. Kerney Elbe

## 2020-06-16 LAB — CBC
HCT: 26.7 % — ABNORMAL LOW (ref 36.0–46.0)
Hemoglobin: 9 g/dL — ABNORMAL LOW (ref 12.0–15.0)
MCH: 33.1 pg (ref 26.0–34.0)
MCHC: 33.7 g/dL (ref 30.0–36.0)
MCV: 98.2 fL (ref 80.0–100.0)
Platelets: 112 10*3/uL — ABNORMAL LOW (ref 150–400)
RBC: 2.72 MIL/uL — ABNORMAL LOW (ref 3.87–5.11)
RDW: 13 % (ref 11.5–15.5)
WBC: 6.7 10*3/uL (ref 4.0–10.5)
nRBC: 0 % (ref 0.0–0.2)

## 2020-06-16 LAB — IRON AND TIBC
Iron: 84 ug/dL (ref 28–170)
Saturation Ratios: 49 % — ABNORMAL HIGH (ref 10.4–31.8)
TIBC: 171 ug/dL — ABNORMAL LOW (ref 250–450)
UIBC: 87 ug/dL

## 2020-06-16 LAB — RETICULOCYTES
Immature Retic Fract: 11.4 % (ref 2.3–15.9)
RBC.: 2.73 MIL/uL — ABNORMAL LOW (ref 3.87–5.11)
Retic Count, Absolute: 47.8 10*3/uL (ref 19.0–186.0)
Retic Ct Pct: 1.8 % (ref 0.4–3.1)

## 2020-06-16 LAB — RENAL FUNCTION PANEL
Albumin: 2.9 g/dL — ABNORMAL LOW (ref 3.5–5.0)
Anion gap: 10 (ref 5–15)
BUN: 16 mg/dL (ref 8–23)
CO2: 29 mmol/L (ref 22–32)
Calcium: 9.2 mg/dL (ref 8.9–10.3)
Chloride: 97 mmol/L — ABNORMAL LOW (ref 98–111)
Creatinine, Ser: 5.36 mg/dL — ABNORMAL HIGH (ref 0.44–1.00)
GFR, Estimated: 8 mL/min — ABNORMAL LOW (ref >60)
Glucose, Bld: 130 mg/dL — ABNORMAL HIGH (ref 70–99)
Phosphorus: 3.9 mg/dL (ref 2.5–4.6)
Potassium: 3.9 mmol/L (ref 3.5–5.1)
Sodium: 136 mmol/L (ref 135–145)

## 2020-06-16 LAB — CULTURE, BLOOD (ROUTINE X 2)
Culture: NO GROWTH
Culture: NO GROWTH

## 2020-06-16 LAB — FERRITIN: Ferritin: 1657 ng/mL — ABNORMAL HIGH (ref 11–307)

## 2020-06-16 LAB — MAGNESIUM: Magnesium: 2.2 mg/dL (ref 1.7–2.4)

## 2020-06-16 NOTE — Progress Notes (Signed)
PROGRESS NOTE        PATIENT DETAILS Name: [name] [name] Age: 65 y.o. Sex: female Date of Birth: 1955/05/02 Admit Date: 06/11/2020 Admitting Physician Karmen Bongo, MD IWP:[id number], [name], MD  Brief Narrative: Patient is a 65 y.o. female with history of ESRD, HTN, depression/anxiety-chronic hypoxic respiratory failure on 2.5 L of oxygen at home, NMDA encephalitis (2018)-presented with fever and weakness after dialysis.   See below for further details.   Significant events: 3/21>> admit for evaluation of fever/weakness post HD.   Significant studies: 3/21>> chest x-ray: No pneumonia 3/24>> EEG: No seizures-d mild diffuse encephalopathy 3/25 - MRI -  1. No acute intracranial abnormality. 2. Stable small area of encephalomalacia and gliosis in the right frontal lobe at the site of prior biopsy. 3. Scattered and confluent foci of T2 hyperintensity within the white matter of the cerebral hemispheres, nonspecific, but not significantly changed from prior MRI.   Microbiology data: 3/21>> blood culture: No growth 3/21>> influenza/Covid PCR: Negative  Antimicrobial therapy: Vancomycin: 3/21 x1 Cefepime: 3/21>> 3/24 Flagyl: 3/21 x 1  Procedures : None  Consults: Renal  DVT Prophylaxis : heparin injection 5,000 Units Start: 06/11/20 1400   Subjective:  Patient in bed, appears comfortable, denies any headache, no fever, no chest pain or pressure, no shortness of breath , no abdominal pain. No focal weakness.  Assessment/Plan:  SIRS/fever: No obvious foci of infection apparent yet-blood cultures remain negative-off all antimicrobial therapy.  Doubt she had sepsis physiology on admission  Waxing/waning encephalopathy-myoclonic jerks: Per family-ongoing for the past several weeks-initially attributed to possible uremia-and Lyrica use.  Dialyzed frequently-Lyrica discontinued-however continues to have intermittent encephalopathy-and myoclonic  jerks.  EEG unremarkable.  MRI with some nonspecific changes specifically scattered T2 hyperintensity in the white matter which is nonspecific but new finding, neurology following mentation is better with supportive care, will await any further input by neurology.  ESRD on HD MWF: Nephrology following.  Case discussed with nephrology on 06/16/2020.  Normocytic anemia: Due to ESRD-defer Aranesp/IV iron to nephrology.  No evidence of GI loss at this point.  Thrombocytopenia: Mild-likely due to infectious issues-stable for close monitoring.  HTN: BP stable-continue Norvasc/hydralazine/Imdur.  HLD: Continue Lipitor  Depression/anxiety: Stable-continue Cymbalta/Seroquel/trazodone  Chronic hypoxic respiratory failure secondary to possible ILD: On 2.5 L of oxygen at all times.  Continue PCCM follow-up in the outpatient setting  Chronic pain syndrome: Stable-Lyrica discontinued.  History of NMDA encephalitis-s/p brain biopsy-followed by [facility]H along the surgical drain site in 2018 ([facility])  Diet: Diet Order            Diet renal with fluid restriction Fluid restriction: 1200 mL Fluid; Room service appropriate? Yes; Fluid consistency: Thin  Diet effective now           Diet - low sodium heart healthy                  Code Status: Full code   Family Communication: [phone] updated on 3/25  Disposition Plan: Status is: Inpatient  Remains inpatient appropriate because:Inpatient level of care appropriate due to severity of illness    Dispo: The patient is from: Home              Anticipated d/c is to: Home              Patient currently is not medically  stable to d/c.   Difficult to place patient No    Barriers to Discharge: Sepsis-unknown foci-remains on IV antibiotics-if remains stable-possible discharge in the next 1-2 days.  Antimicrobial agents: Anti-infectives (From admission, onward)   Start     Dose/Rate Route Frequency  Ordered Stop   06/15/20 1800  ceFEPIme (MAXIPIME) 2 g in sodium chloride 0.9 % 100 mL IVPB  Status:  Discontinued        2 g 200 mL/hr over 30 Minutes Intravenous Every M-W-F 06/14/20 0756 06/14/20 1104   06/15/20 1200  ceFEPIme (MAXIPIME) 2 g in sodium chloride 0.9 % 100 mL IVPB  Status:  Discontinued        2 g 200 mL/hr over 30 Minutes Intravenous Every M-W-F (Hemodialysis) 06/13/20 1109 06/14/20 0756   06/14/20 1800  ceFEPIme (MAXIPIME) 1 g in sodium chloride 0.9 % 100 mL IVPB  Status:  Discontinued        1 g 200 mL/hr over 30 Minutes Intravenous  Once 06/14/20 0756 06/14/20 1101   06/14/20 1400  ceFEPIme (MAXIPIME) 2 g in sodium chloride 0.9 % 100 mL IVPB        2 g 200 mL/hr over 30 Minutes Intravenous  Once 06/14/20 1101 06/14/20 1329   06/14/20 1200  ceFEPIme (MAXIPIME) 1 g in sodium chloride 0.9 % 100 mL IVPB  Status:  Discontinued        1 g 200 mL/hr over 30 Minutes Intravenous  Once 06/14/20 0739 06/14/20 0756   06/13/20 2000  ceFEPIme (MAXIPIME) 1 g in sodium chloride 0.9 % 100 mL IVPB  Status:  Discontinued        1 g 200 mL/hr over 30 Minutes Intravenous Every 24 hours 06/13/20 1109 06/14/20 0737   06/13/20 1200  vancomycin (VANCOREADY) IVPB 1000 mg/200 mL  Status:  Discontinued        1,000 mg 200 mL/hr over 60 Minutes Intravenous Every M-W-F (Hemodialysis) 06/11/20 0744 06/14/20 0756   06/12/20 0800  ceFEPIme (MAXIPIME) 1 g in sodium chloride 0.9 % 100 mL IVPB  Status:  Discontinued        1 g 200 mL/hr over 30 Minutes Intravenous Every 24 hours 06/11/20 0744 06/13/20 1109   06/11/20 0800  vancomycin (VANCOREADY) IVPB 1500 mg/300 mL  Status:  Discontinued        1,500 mg 150 mL/hr over 120 Minutes Intravenous  Once 06/11/20 0739 06/11/20 0743   06/11/20 0800  vancomycin (VANCOREADY) IVPB 2000 mg/400 mL        2,000 mg 200 mL/hr over 120 Minutes Intravenous  Once 06/11/20 0743 06/11/20 1104   06/11/20 0745  ceFEPIme (MAXIPIME) 2 g in sodium chloride 0.9 % 100 mL IVPB         2 g 200 mL/hr over 30 Minutes Intravenous  Once 06/11/20 0739 06/11/20 0904   06/11/20 0745  metroNIDAZOLE (FLAGYL) IVPB 500 mg        500 mg 100 mL/hr over 60 Minutes Intravenous  Once 06/11/20 0739 06/11/20 2993       Time spent: 25 minutes-Greater than 50% of this time was spent in counseling, explanation of diagnosis, planning of further management, and coordination of care.  MEDICATIONS: Scheduled Meds: . amLODipine  10 mg Oral Daily  . atorvastatin  40 mg Oral Daily  . Chlorhexidine Gluconate Cloth  6 each Topical Q0600  . doxercalciferol  7 mcg Intravenous Q M,W,F-HD  . DULoxetine  20 mg Oral Daily  . ferric citrate  210 mg  Oral TID with meals  . heparin  5,000 Units Subcutaneous Q8H  . hydrALAZINE  100 mg Oral BID  . isosorbide dinitrate  20 mg Oral TID  . multivitamin  1 tablet Oral QHS  . pantoprazole  40 mg Oral Daily  . QUEtiapine  200 mg Oral BID  . sodium chloride flush  3 mL Intravenous Q12H   Continuous Infusions:  PRN Meds:.acetaminophen **OR** acetaminophen, albuterol, calcium carbonate (dosed in mg elemental calcium), camphor-menthol **AND** hydrOXYzine, docusate sodium, feeding supplement (NEPRO CARB STEADY), loratadine, ondansetron **OR** ondansetron (ZOFRAN) IV, sorbitol, traZODone, zolpidem   PHYSICAL EXAM: Vital signs: Vitals:   06/15/20 1454 06/15/20 2047 06/15/20 2215 06/16/20 0400  BP: 132/72 123/81 126/81 113/79  Pulse: 83 82  72  Resp: 20 16  17   Temp: 98.4 F (36.9 C) 98.7 F (37.1 C)  98.5 F (36.9 C)  TempSrc: Oral Oral  Oral  SpO2: 99%   100%  Weight:    92.3 kg  Height:       Filed Weights   06/14/20 1140 06/15/20 1144 06/16/20 0400  Weight: 91.7 kg 90.7 kg 92.3 kg   Body mass index is 32.84 kg/m.   Gen Exam:   Awake Alert, No new F.N deficits, Normal affect [place].AT,PERRAL Supple Neck,No JVD, No cervical lymphadenopathy appriciated.  Symmetrical Chest wall movement, Good air movement bilaterally, CTAB RRR,No Gallops,  Rubs or new Murmurs, No Parasternal Heave +ve B.Sounds, Abd Soft, No tenderness, No organomegaly appriciated, No rebound - guarding or rigidity. No Cyanosis, Clubbing or edema, No new Rash or bruise   I have personally reviewed following labs and imaging studies  LABORATORY DATA: CBC: Recent Labs  Lab 06/11/20 0710 06/12/20 0306 06/13/20 0156 06/14/20 0117 06/15/20 0017 06/16/20 0042  WBC 11.7* 11.2* 9.4 7.8 6.0 6.7  NEUTROABS 9.9*  --   --   --   --   --   HGB 10.7* 8.5* 8.7* 8.3* 8.5* 9.0*  HCT 33.2* 26.7* 26.2* 25.1* 25.4* 26.7*  MCV 102.8* 101.1* 100.4* 97.7 97.3 98.2  PLT 118* 107* 97* 96* 92* 112*    Basic Metabolic Panel: Recent Labs  Lab 06/13/20 0156 06/14/20 0117 06/14/20 1508 06/15/20 0017 06/16/20 0042  NA 137 134* 133* 133* 136  K 3.8 3.9 3.7 3.7 3.9  CL 95* 92* 95* 95* 97*  CO2 23 23 25 25 29   GLUCOSE 127* 108* 104* 118* 130*  BUN 66* 77* 26* 30* 16  CREATININE 13.09* 14.17* 7.00* 8.11* 5.36*  CALCIUM 7.8* 7.7* 8.4* 8.5* 9.2  MG  --   --   --   --  2.2  PHOS 9.8* 10.3* 4.7* 5.4* 3.9    GFR: Estimated Creatinine Clearance: 12.5 mL/min (A) (by C-G formula based on SCr of 5.36 mg/dL (H)).  Liver Function Tests: Recent Labs  Lab 06/11/20 0710 06/13/20 0156 06/14/20 0117 06/14/20 1508 06/15/20 0017 06/16/20 0042  AST 18  --   --   --   --   --   ALT 15  --   --   --   --   --   ALKPHOS 103  --   --   --   --   --   BILITOT 0.6  --   --   --   --   --   PROT 7.4  --   --   --   --   --   ALBUMIN 3.5 2.9* 2.9* 2.9* 2.8* 2.9*   No results for input(s):  LIPASE, AMYLASE in the last 168 hours. Recent Labs  Lab 06/14/20 1508  AMMONIA 15    Coagulation Profile: Recent Labs  Lab 06/11/20 0738  INR 1.1    Cardiac Enzymes: No results for input(s): CKTOTAL, CKMB, CKMBINDEX, TROPONINI in the last 168 hours.  BNP (last 3 results) No results for input(s): PROBNP in the last 8760 hours.  Lipid Profile: No results for input(s): CHOL, HDL,  LDLCALC, TRIG, CHOLHDL, LDLDIRECT in the last 72 hours.  Thyroid Function Tests: No results for input(s): TSH, T4TOTAL, FREET4, T3FREE, THYROIDAB in the last 72 hours.  Anemia Panel: Recent Labs    06/16/20 0042  FERRITIN 1,657*  TIBC 171*  IRON 84  RETICCTPCT 1.8    Urine analysis:    Component Value Date/Time   COLORURINE YELLOW 06/12/2020 1600   APPEARANCEUR CLOUDY (A) 06/12/2020 1600   LABSPEC 1.016 06/12/2020 1600   PHURINE 5.0 06/12/2020 1600   GLUCOSEU NEGATIVE 06/12/2020 1600   HGBUR SMALL (A) 06/12/2020 1600   BILIRUBINUR NEGATIVE 06/12/2020 1600   BILIRUBINUR negative 07/23/2017 1536   KETONESUR NEGATIVE 06/12/2020 1600   PROTEINUR >=300 (A) 06/12/2020 1600   UROBILINOGEN 0.2 07/23/2017 1536   UROBILINOGEN 0.2 09/16/2014 1330   NITRITE NEGATIVE 06/12/2020 1600   LEUKOCYTESUR LARGE (A) 06/12/2020 1600    Sepsis Labs: Lactic Acid, Venous    Component Value Date/Time   LATICACIDVEN 1.7 06/11/2020 1007    MICROBIOLOGY: Recent Results (from the past 240 hour(s))  Resp Panel by RT-PCR (Flu A&B, Covid) Nasopharyngeal Swab     Status: None   Collection Time: 06/11/20  7:12 AM   Specimen: Nasopharyngeal Swab; Nasopharyngeal(NP) swabs in vial transport medium  Result Value Ref Range Status   SARS Coronavirus 2 by RT PCR NEGATIVE NEGATIVE Final    Comment: (NOTE) SARS-CoV-2 target nucleic acids are NOT DETECTED.  The SARS-CoV-2 RNA is generally detectable in upper respiratory specimens during the acute phase of infection. The lowest concentration of SARS-CoV-2 viral copies this assay can detect is 138 copies/mL. A negative result does not preclude SARS-Cov-2 infection and should not be used as the sole basis for treatment or other patient management decisions. A negative result may occur with  improper specimen collection/handling, submission of specimen other than nasopharyngeal swab, presence of viral mutation(s) within the areas targeted by this assay, and  inadequate number of viral copies(<138 copies/mL). A negative result must be combined with clinical observations, patient history, and epidemiological information. The expected result is Negative.  Fact Sheet for Patients:  EntrepreneurPulse.com.au  Fact Sheet for Healthcare Providers:  IncredibleEmployment.be  This test is no t yet approved or cleared by the [place] FDA and  has been authorized for detection and/or diagnosis of SARS-CoV-2 by FDA under an Emergency Use Authorization (EUA). This EUA will remain  in effect (meaning this test can be used) for the duration of the COVID-19 declaration under Section 564(b)(1) of the Act, 21 U.S.C.section 360bbb-3(b)(1), unless the authorization is terminated  or revoked sooner.       Influenza A by PCR NEGATIVE NEGATIVE Final   Influenza B by PCR NEGATIVE NEGATIVE Final    Comment: (NOTE) The Xpert Xpress SARS-CoV-2/FLU/RSV plus assay is intended as an aid in the diagnosis of influenza from Nasopharyngeal swab specimens and should not be used as a sole basis for treatment. Nasal washings and aspirates are unacceptable for Xpert Xpress SARS-CoV-2/FLU/RSV testing.  Fact Sheet for Patients: EntrepreneurPulse.com.au  Fact Sheet for Healthcare Providers: IncredibleEmployment.be  This test is not  yet approved or cleared by the [place] and has been authorized for detection and/or diagnosis of SARS-CoV-2 by FDA under an Emergency Use Authorization (EUA). This EUA will remain in effect (meaning this test can be used) for the duration of the COVID-19 declaration under Section 564(b)(1) of the Act, 21 U.S.C. section 360bbb-3(b)(1), unless the authorization is terminated or revoked.  Performed at [facility], [place] [address], [place], [place] [address]   Culture, blood (routine x 2)     Status: None (Preliminary result)   Collection Time:  06/11/20  7:16 AM   Specimen: BLOOD RIGHT FOREARM  Result Value Ref Range Status   Specimen Description BLOOD RIGHT FOREARM  Final   Special Requests   Final    BOTTLES DRAWN AEROBIC AND ANAEROBIC Blood Culture results may not be optimal due to an inadequate volume of blood received in culture bottles   Culture   Final    NO GROWTH 4 DAYS Performed at [facility], [place] [address], [place], [place] [address]    Report Status PENDING  Incomplete  Culture, blood (routine x 2)     Status: None (Preliminary result)   Collection Time: 06/11/20  7:30 AM   Specimen: BLOOD LEFT HAND  Result Value Ref Range Status   Specimen Description BLOOD LEFT HAND  Final   Special Requests   Final    AEROBIC BOTTLE ONLY Blood Culture results may not be optimal due to an inadequate volume of blood received in culture bottles   Culture   Final    NO GROWTH 4 DAYS Performed at [facility], [place] [address] [place], [place] [address]    Report Status PENDING  Incomplete  MRSA PCR Screening     Status: None   Collection Time: 06/12/20  6:28 AM   Specimen: Nasal Mucosa; Nasopharyngeal  Result Value Ref Range Status   MRSA by PCR NEGATIVE NEGATIVE Final    Comment:        The GeneXpert MRSA Assay (FDA approved for NASAL specimens only), is one component of a comprehensive MRSA colonization surveillance program. It is not intended to diagnose MRSA infection nor to guide or monitor treatment for MRSA infections. Performed at [facility], [place] [address], [place], [place] [address]     RADIOLOGY STUDIES/RESULTS: MR BRAIN WO CONTRAST  Result Date: 06/15/2020 CLINICAL DATA:  Mental status change. EXAM: MRI HEAD WITHOUT CONTRAST TECHNIQUE: Multiplanar, multiecho pulse sequences of the brain and surrounding structures were obtained without intravenous contrast. COMPARISON:  MRI of the brain February 22, 2019. FINDINGS: Brain: No acute infarction, hemorrhage, hydrocephalus, extra-axial  collection or mass lesion. Small area of encephalomalacia and gliosis in the right frontal lobe at the site of prior biopsy. There is small herniation of brain tissue in this location into the burr hole, unchanged. Scattered and confluent foci of T2 hyperintensity are seen within the white matter of the cerebral hemispheres, nonspecific, not significantly changed from prior MRI. Vascular: Normal flow voids. Skull and upper cervical spine: Diffuse decrease of the T1 signal within the calvarium may be related to red marrow reconversion in the setting of anemia. Burr hole in the right frontal region. Sinuses/Orbits: Negative. IMPRESSION: 1. No acute intracranial abnormality. 2. Stable small area of encephalomalacia and gliosis in the right frontal lobe at the site of prior biopsy. 3. Scattered and confluent foci of T2 hyperintensity within the white matter of the cerebral hemispheres, nonspecific, but not significantly changed  from prior MRI. Electronically Signed   By: Pedro Earls M.D.   On: 06/15/2020 15:50   EEG adult  Result Date: 06/14/2020 Raenette Rover, MD     06/14/2020  9:01 PM EEG Report Indication: possible seizure activity The duration of the study was 22 minutes.  Electrodes were placed according to the International 10/20 system.  Video was reviewed/available for clinical correlation as needed. This study was conducted during the waking/drowsy versus encephalopathic state. There was  perhaps a very vague anterior-posteirior voltage and frequency gradient.  There were no other clear features of organization including a discernible posterior dominant rhythm or sleep architecture noted at any point.  The background was mainly delta/theta slowing, of moderate amplitude.  Clear reactivity was seen, with a state change from a period with faster alpha/theta and a vague gradient transitioning to another period with diffuse theta/delta slowing.   Hyperventilation: Deferred Photic  stimulation: Unremarkable There are no clear focal, paroxysmal or epileptiform abnormalities  or interhemispheric asymmetries. Impression: This is an abnormal waking/drowsy study due to markedly diminished versus absent organization consistent with a mild diffuse encephalopathy.  There are no clear focal or epileptiform abnormalities.     LOS: 5 days   Lala Lund, MD  [facility]   06/16/2020, 12:10 PM

## 2020-06-16 NOTE — Plan of Care (Signed)
  Problem: Clinical Measurements: Goal: Will remain free from infection Outcome: Progressing Goal: Diagnostic test results will improve Outcome: Progressing Goal: Respiratory complications will improve Outcome: Progressing   Problem: Nutrition: Goal: Adequate nutrition will be maintained Outcome: Progressing   Problem: Safety: Goal: Ability to remain free from injury will improve Outcome: Progressing

## 2020-06-16 NOTE — Progress Notes (Signed)
[name] Progress Note  Subjective:  Completed dialysis yesterday. Tolerated well.  Myclonic jerks seem improved today    Vitals:   06/15/20 1454 06/15/20 2047 06/15/20 2215 06/16/20 0400  BP: 132/72 123/81 126/81 113/79  Pulse: 83 82  72  Resp: 20 16  17   Temp: 98.4 F (36.9 C) 98.7 F (37.1 C)  98.5 F (36.9 C)  TempSrc: Oral Oral  Oral  SpO2: 99%   100%  Weight:    92.3 kg  Height:        Exam: Gen alert, no distress No jvd or bruits Chest poor air movement in general, no rales/ wheezing RRR no MRG Abd soft ntnd no mass or ascites +bs Ext no LE edema Neuro is alert, Ox 3 , nf  LUA AVF +bruit, no erythema/ drainage   Home meds:  - norvasc 10/ hydralazine 100 bid/ isordil 20 tid/ sl ntg prn  - prilosec 40 qd/ auryxia 210 tid ac/ lipitor 40 qd  - cymbalta 20 qd/ lyrica 100 tid/ seroquel 200 bid/ trazodone 50 hs prn/ percocet prn  - prn's/ vitamins/ supplements    CXR no acute disease   OP HD: [place] MWF  3.5h  86kg  2/2 bath  Hep 2000  AVF  - hect 7ug tiw  - no esa  Assessment/ Plan: 1. Fever - high fever 103 in ED on admission.  No signs of access infection on exam. Fevers resolved. Pt completed short course of IV abx here. Blood cx's negative. Will not need any outpatient IV abx w/ HD.   2. Myoclonic jerking -  MS change noted.  Lyrica was dc'd. Some concern for uremic component. BUN/Cr have improved with HD x 2 here. Neuro consulted -repeat MRI unchanged from prior. Felt 2/2 uremic +/- toxic encpehpalopathy  Have listed Lyrica as an intolerance, pt should avoid  Lyrica and gabapentin in the future.   3. ESRD - on HD MWF. Back on schedule. Next HD 3/28.  4. HTN/ volume - cont BP meds. Needs std weight. CXR is negative. No gross vol excess on exam. Wt's high but not sure they are accurate.  5. Depression - on multiple psych meds, per pmd 6. CAD - on nitrates and sl ntg prn 7. Anemia ckd - not on esa at OP unit. Restart with next HD  8. MBD  ckd - cont vdra and binder Claris Che PA-C  [facility] 06/16/2020,11:06 AM    Recent Labs  Lab 06/15/20 0017 06/16/20 0042  K 3.7 3.9  BUN 30* 16  CREATININE 8.11* 5.36*  CALCIUM 8.5* 9.2  PHOS 5.4* 3.9  HGB 8.5* 9.0*   Inpatient medications: . amLODipine  10 mg Oral Daily  . atorvastatin  40 mg Oral Daily  . Chlorhexidine Gluconate Cloth  6 each Topical Q0600  . doxercalciferol  7 mcg Intravenous Q M,W,F-HD  . DULoxetine  20 mg Oral Daily  . ferric citrate  210 mg Oral TID with meals  . heparin  5,000 Units Subcutaneous Q8H  . hydrALAZINE  100 mg Oral BID  . isosorbide dinitrate  20 mg Oral TID  . multivitamin  1 tablet Oral QHS  . pantoprazole  40 mg Oral Daily  . QUEtiapine  200 mg Oral BID  . sodium chloride flush  3 mL Intravenous Q12H    acetaminophen **OR** acetaminophen, albuterol, calcium carbonate (dosed in mg elemental calcium), camphor-menthol **AND** hydrOXYzine, docusate sodium, feeding supplement (NEPRO CARB STEADY), loratadine, ondansetron **  OR** ondansetron (ZOFRAN) IV, sorbitol, traZODone, zolpidem

## 2020-06-17 MED ORDER — DARBEPOETIN ALFA 60 MCG/0.3ML IJ SOSY: 60.0000 ug | PREFILLED_SYRINGE | INTRAMUSCULAR | Status: DC

## 2020-06-17 NOTE — Plan of Care (Signed)
Alert and oriented x 3 with intermittent confusion. Keeps taking off oxygen and telemetry leads and doent seem to be aware she is doing it. Denies pain or discomfort. Follows instructions and easily redirectable.Will continue to monitor.  Problem: Education: Goal: Knowledge of General Education information will improve Description: Including pain rating scale, medication(s)/side effects and non-pharmacologic comfort measures Outcome: Progressing   Problem: Health Behavior/Discharge Planning: Goal: Ability to manage health-related needs will improve Outcome: Progressing   Problem: Clinical Measurements: Goal: Ability to maintain clinical measurements within normal limits will improve Outcome: Progressing Goal: Will remain free from infection Outcome: Progressing Goal: Diagnostic test results will improve Outcome: Progressing Goal: Respiratory complications will improve Outcome: Progressing Goal: Cardiovascular complication will be avoided Outcome: Progressing   Problem: Activity: Goal: Risk for activity intolerance will decrease Outcome: Progressing   Problem: Nutrition: Goal: Adequate nutrition will be maintained Outcome: Progressing   Problem: Coping: Goal: Level of anxiety will decrease Outcome: Progressing   Problem: Elimination: Goal: Will not experience complications related to bowel motility Outcome: Progressing Goal: Will not experience complications related to urinary retention Outcome: Progressing   Problem: Safety: Goal: Ability to remain free from injury will improve Outcome: Progressing   Problem: Skin Integrity: Goal: Risk for impaired skin integrity will decrease Outcome: Progressing

## 2020-06-17 NOTE — TOC Progression Note (Signed)
Transition of Care [facility]) - Progression Note    Patient Details  Name: [name] [name] MRN: [id number] Date of Birth: [birth year]  [facility] CM/SW Contact  Carles Collet, RN Phone Number: 06/17/2020, 11:04 AM  Clinical Narrative:   Damaris Schooner w daughter on the phone, she would like a [facility] RN added to [facility] orders. Alvis Lemmings is able to provide a RN, informed MD and modified HH order.     Expected Discharge Plan: [place] Barriers to Discharge: Continued Medical Work up  Expected Discharge Plan and Services Expected Discharge Plan: [place]   Discharge Planning Services: CM Consult Post Acute Care Choice: [place] arrangements for the past 2 months: Single Family Home Expected Discharge Date: 06/14/20               DME Arranged: N/A DME Agency: NA       HH Arranged: PT HH Agency: [place] Date [place]: 06/13/20 Time [place]: 0923 Representative spoke with at [place]: [place] Interventions    Readmission Risk Interventions No flowsheet data found.

## 2020-06-17 NOTE — Progress Notes (Signed)
PROGRESS NOTE        PATIENT DETAILS Name: [name] [name] Age: 65 y.o. Sex: female Date of Birth: Feb 05, 1956 Admit Date: 06/11/2020 Admitting Physician Karmen Bongo, MD [id number], [name], MD  Brief Narrative: Patient is a 65 y.o. female with history of ESRD, HTN, depression/anxiety-chronic hypoxic respiratory failure on 2.5 L of oxygen at home, NMDA encephalitis (2018)-presented with fever and weakness after dialysis.   See below for further details.   Significant events: 3/21>> admit for evaluation of fever/weakness post HD.   Significant studies: 3/21>> chest x-ray: No pneumonia 3/24>> EEG: No seizures-d mild diffuse encephalopathy 3/25 - MRI -  1. No acute intracranial abnormality. 2. Stable small area of encephalomalacia and gliosis in the right frontal lobe at the site of prior biopsy. 3. Scattered and confluent foci of T2 hyperintensity within the white matter of the cerebral hemispheres, nonspecific, but not significantly changed from prior MRI.   Microbiology data: 3/21>> blood culture: No growth 3/21>> influenza/Covid PCR: Negative  Antimicrobial therapy: Vancomycin: 3/21 x1 Cefepime: 3/21>> 3/24 Flagyl: 3/21 x 1  Procedures : None  Consults: Renal  DVT Prophylaxis : heparin injection 5,000 Units Start: 06/11/20 1400   Subjective:  Patient in bed, appears comfortable, denies any headache, no fever, no chest pain or pressure, no shortness of breath , no abdominal pain. No focal weakness.   Assessment/Plan:  SIRS/fever: No obvious foci of infection apparent yet-blood cultures remain negative-off all antimicrobial therapy.  Doubt she had sepsis physiology on admission  Waxing/waning encephalopathy-myoclonic jerks: Per family-ongoing for the past several weeks-initially attributed to possible uremia-and Lyrica use.  Dialyzed frequently-Lyrica discontinued-however continues to have intermittent encephalopathy-and myoclonic  jerks.  EEG unremarkable.  MRI with some nonspecific changes specifically scattered T2 hyperintensity in the white matter which is nonspecific but new finding, neurology following mentation is better with supportive care, discussed with neurologist Dr. Cheral Marker on 06/17/2020, patient continues to show improvement, will monitor another 24 hours if stable discharge home on 06/18/2020.  ESRD on HD MWF: Nephrology following.  Case discussed with nephrology on 06/16/2020.  Normocytic anemia: Due to ESRD-defer Aranesp/IV iron to nephrology.  No evidence of GI loss at this point.  Thrombocytopenia: Mild-likely due to infectious issues-stable for close monitoring.  HTN: BP stable-continue Norvasc/hydralazine/Imdur.  HLD: Continue Lipitor  Depression/anxiety: Stable-continue Cymbalta/Seroquel/trazodone  Chronic hypoxic respiratory failure secondary to possible ILD: On 2.5 L of oxygen at all times.  Continue PCCM follow-up in the outpatient setting  Chronic pain syndrome: Stable-Lyrica discontinued.  History of NMDA encephalitis-s/p brain biopsy-followed by [facility] along the surgical drain site in 2018 ([facility])  Diet: Diet Order            Diet renal with fluid restriction Fluid restriction: 1200 mL Fluid; Room service appropriate? Yes with Assist; Fluid consistency: Thin  Diet effective now           Diet - low sodium heart healthy                  Code Status: Full code   Family Communication: [phone] updated on 06/16/20 - bedside.  Disposition Plan: Status is: Inpatient  Remains inpatient appropriate because:Inpatient level of care appropriate due to severity of illness    Dispo: The patient is from: Home              Anticipated d/c  is to: Home              Patient currently is not medically stable to d/c.   Difficult to place patient No    Barriers to Discharge: Sepsis-unknown foci-remains on IV antibiotics-if remains  stable-possible discharge in the next 1-2 days.  Antimicrobial agents: Anti-infectives (From admission, onward)   Start     Dose/Rate Route Frequency Ordered Stop   06/15/20 1800  ceFEPIme (MAXIPIME) 2 g in sodium chloride 0.9 % 100 mL IVPB  Status:  Discontinued        2 g 200 mL/hr over 30 Minutes Intravenous Every M-W-F 06/14/20 0756 06/14/20 1104   06/15/20 1200  ceFEPIme (MAXIPIME) 2 g in sodium chloride 0.9 % 100 mL IVPB  Status:  Discontinued        2 g 200 mL/hr over 30 Minutes Intravenous Every M-W-F (Hemodialysis) 06/13/20 1109 06/14/20 0756   06/14/20 1800  ceFEPIme (MAXIPIME) 1 g in sodium chloride 0.9 % 100 mL IVPB  Status:  Discontinued        1 g 200 mL/hr over 30 Minutes Intravenous  Once 06/14/20 0756 06/14/20 1101   06/14/20 1400  ceFEPIme (MAXIPIME) 2 g in sodium chloride 0.9 % 100 mL IVPB        2 g 200 mL/hr over 30 Minutes Intravenous  Once 06/14/20 1101 06/14/20 1329   06/14/20 1200  ceFEPIme (MAXIPIME) 1 g in sodium chloride 0.9 % 100 mL IVPB  Status:  Discontinued        1 g 200 mL/hr over 30 Minutes Intravenous  Once 06/14/20 0739 06/14/20 0756   06/13/20 2000  ceFEPIme (MAXIPIME) 1 g in sodium chloride 0.9 % 100 mL IVPB  Status:  Discontinued        1 g 200 mL/hr over 30 Minutes Intravenous Every 24 hours 06/13/20 1109 06/14/20 0737   06/13/20 1200  vancomycin (VANCOREADY) IVPB 1000 mg/200 mL  Status:  Discontinued        1,000 mg 200 mL/hr over 60 Minutes Intravenous Every M-W-F (Hemodialysis) 06/11/20 0744 06/14/20 0756   06/12/20 0800  ceFEPIme (MAXIPIME) 1 g in sodium chloride 0.9 % 100 mL IVPB  Status:  Discontinued        1 g 200 mL/hr over 30 Minutes Intravenous Every 24 hours 06/11/20 0744 06/13/20 1109   06/11/20 0800  vancomycin (VANCOREADY) IVPB 1500 mg/300 mL  Status:  Discontinued        1,500 mg 150 mL/hr over 120 Minutes Intravenous  Once 06/11/20 0739 06/11/20 0743   06/11/20 0800  vancomycin (VANCOREADY) IVPB 2000 mg/400 mL        2,000  mg 200 mL/hr over 120 Minutes Intravenous  Once 06/11/20 0743 06/11/20 1104   06/11/20 0745  ceFEPIme (MAXIPIME) 2 g in sodium chloride 0.9 % 100 mL IVPB        2 g 200 mL/hr over 30 Minutes Intravenous  Once 06/11/20 0739 06/11/20 0904   06/11/20 0745  metroNIDAZOLE (FLAGYL) IVPB 500 mg        500 mg 100 mL/hr over 60 Minutes Intravenous  Once 06/11/20 0739 06/11/20 0932       Time spent: 25 minutes-Greater than 50% of this time was spent in counseling, explanation of diagnosis, planning of further management, and coordination of care.  MEDICATIONS: Scheduled Meds: . amLODipine  10 mg Oral Daily  . atorvastatin  40 mg Oral Daily  . Chlorhexidine Gluconate Cloth  6 each Topical Q0600  . doxercalciferol  7 mcg Intravenous Q M,W,F-HD  . DULoxetine  20 mg Oral Daily  . ferric citrate  210 mg Oral TID with meals  . heparin  5,000 Units Subcutaneous Q8H  . hydrALAZINE  100 mg Oral BID  . isosorbide dinitrate  20 mg Oral TID  . multivitamin  1 tablet Oral QHS  . pantoprazole  40 mg Oral Daily  . QUEtiapine  200 mg Oral BID  . sodium chloride flush  3 mL Intravenous Q12H   Continuous Infusions:  PRN Meds:.acetaminophen **OR** acetaminophen, albuterol, calcium carbonate (dosed in mg elemental calcium), camphor-menthol **AND** hydrOXYzine, docusate sodium, feeding supplement (NEPRO CARB STEADY), loratadine, ondansetron **OR** ondansetron (ZOFRAN) IV, sorbitol, traZODone, zolpidem   PHYSICAL EXAM: Vital signs: Vitals:   06/17/20 0000 06/17/20 0400 06/17/20 0840 06/17/20 0843  BP: 128/72 123/71  127/73  Pulse: 94 65    Resp: 20 14    Temp: 98.6 F (37 C) 97.8 F (36.6 C)    TempSrc:  Oral    SpO2: 94% 99% 95%   Weight:      Height:       Filed Weights   06/14/20 1140 06/15/20 1144 06/16/20 0400  Weight: 91.7 kg 90.7 kg 92.3 kg   Body mass index is 32.84 kg/m.   Gen Exam:   Awake Alert, No new F.N deficits, Normal affect Strum.AT,PERRAL Supple Neck,No JVD, No cervical  lymphadenopathy appriciated.  Symmetrical Chest wall movement, Good air movement bilaterally, CTAB RRR,No Gallops, Rubs or new Murmurs, No Parasternal Heave +ve B.Sounds, Abd Soft, No tenderness, No organomegaly appriciated, No rebound - guarding or rigidity. No Cyanosis, Clubbing or edema, No new Rash or bruise    I have personally reviewed following labs and imaging studies  LABORATORY DATA: CBC: Recent Labs  Lab 06/11/20 0710 06/12/20 0306 06/13/20 0156 06/14/20 0117 06/15/20 0017 06/16/20 0042  WBC 11.7* 11.2* 9.4 7.8 6.0 6.7  NEUTROABS 9.9*  --   --   --   --   --   HGB 10.7* 8.5* 8.7* 8.3* 8.5* 9.0*  HCT 33.2* 26.7* 26.2* 25.1* 25.4* 26.7*  MCV 102.8* 101.1* 100.4* 97.7 97.3 98.2  PLT 118* 107* 97* 96* 92* 112*    Basic Metabolic Panel: Recent Labs  Lab 06/13/20 0156 06/14/20 0117 06/14/20 1508 06/15/20 0017 06/16/20 0042  NA 137 134* 133* 133* 136  K 3.8 3.9 3.7 3.7 3.9  CL 95* 92* 95* 95* 97*  CO2 23 23 25 25 29   GLUCOSE 127* 108* 104* 118* 130*  BUN 66* 77* 26* 30* 16  CREATININE 13.09* 14.17* 7.00* 8.11* 5.36*  CALCIUM 7.8* 7.7* 8.4* 8.5* 9.2  MG  --   --   --   --  2.2  PHOS 9.8* 10.3* 4.7* 5.4* 3.9    GFR: Estimated Creatinine Clearance: 12.5 mL/min (A) (by C-G formula based on SCr of 5.36 mg/dL (H)).  Liver Function Tests: Recent Labs  Lab 06/11/20 0710 06/13/20 0156 06/14/20 0117 06/14/20 1508 06/15/20 0017 06/16/20 0042  AST 18  --   --   --   --   --   ALT 15  --   --   --   --   --   ALKPHOS 103  --   --   --   --   --   BILITOT 0.6  --   --   --   --   --   PROT 7.4  --   --   --   --   --  ALBUMIN 3.5 2.9* 2.9* 2.9* 2.8* 2.9*   No results for input(s): LIPASE, AMYLASE in the last 168 hours. Recent Labs  Lab 06/14/20 1508  AMMONIA 15    Coagulation Profile: Recent Labs  Lab 06/11/20 0738  INR 1.1    Cardiac Enzymes: No results for input(s): CKTOTAL, CKMB, CKMBINDEX, TROPONINI in the last 168 hours.  BNP (last 3  results) No results for input(s): PROBNP in the last 8760 hours.  Lipid Profile: No results for input(s): CHOL, HDL, LDLCALC, TRIG, CHOLHDL, LDLDIRECT in the last 72 hours.  Thyroid Function Tests: No results for input(s): TSH, T4TOTAL, FREET4, T3FREE, THYROIDAB in the last 72 hours.  Anemia Panel: Recent Labs    06/16/20 0042  FERRITIN 1,657*  TIBC 171*  IRON 84  RETICCTPCT 1.8    Urine analysis:    Component Value Date/Time   COLORURINE YELLOW 06/12/2020 1600   APPEARANCEUR CLOUDY (A) 06/12/2020 1600   LABSPEC 1.016 06/12/2020 1600   PHURINE 5.0 06/12/2020 1600   GLUCOSEU NEGATIVE 06/12/2020 1600   HGBUR SMALL (A) 06/12/2020 1600   BILIRUBINUR NEGATIVE 06/12/2020 1600   BILIRUBINUR negative 07/23/2017 1536   KETONESUR NEGATIVE 06/12/2020 1600   PROTEINUR >=300 (A) 06/12/2020 1600   UROBILINOGEN 0.2 07/23/2017 1536   UROBILINOGEN 0.2 09/16/2014 1330   NITRITE NEGATIVE 06/12/2020 1600   LEUKOCYTESUR LARGE (A) 06/12/2020 1600    Sepsis Labs: Lactic Acid, Venous    Component Value Date/Time   LATICACIDVEN 1.7 06/11/2020 1007    MICROBIOLOGY: Recent Results (from the past 240 hour(s))  Resp Panel by RT-PCR (Flu A&B, Covid) Nasopharyngeal Swab     Status: None   Collection Time: 06/11/20  7:12 AM   Specimen: Nasopharyngeal Swab; Nasopharyngeal(NP) swabs in vial transport medium  Result Value Ref Range Status   SARS Coronavirus 2 by RT PCR NEGATIVE NEGATIVE Final    Comment: (NOTE) SARS-CoV-2 target nucleic acids are NOT DETECTED.  The SARS-CoV-2 RNA is generally detectable in upper respiratory specimens during the acute phase of infection. The lowest concentration of SARS-CoV-2 viral copies this assay can detect is 138 copies/mL. A negative result does not preclude SARS-Cov-2 infection and should not be used as the sole basis for treatment or other patient management decisions. A negative result may occur with  improper specimen collection/handling, submission  of specimen other than nasopharyngeal swab, presence of viral mutation(s) within the areas targeted by this assay, and inadequate number of viral copies(<138 copies/mL). A negative result must be combined with clinical observations, patient history, and epidemiological information. The expected result is Negative.  Fact Sheet for Patients:  EntrepreneurPulse.com.au  Fact Sheet for Healthcare Providers:  IncredibleEmployment.be  This test is no t yet approved or cleared by the [place] FDA and  has been authorized for detection and/or diagnosis of SARS-CoV-2 by FDA under an Emergency Use Authorization (EUA). This EUA will remain  in effect (meaning this test can be used) for the duration of the COVID-19 declaration under Section 564(b)(1) of the Act, 21 U.S.C.section 360bbb-3(b)(1), unless the authorization is terminated  or revoked sooner.       Influenza A by PCR NEGATIVE NEGATIVE Final   Influenza B by PCR NEGATIVE NEGATIVE Final    Comment: (NOTE) The Xpert Xpress SARS-CoV-2/FLU/RSV plus assay is intended as an aid in the diagnosis of influenza from Nasopharyngeal swab specimens and should not be used as a sole basis for treatment. Nasal washings and aspirates are unacceptable for Xpert Xpress SARS-CoV-2/FLU/RSV testing.  Fact Sheet for Patients:  EntrepreneurPulse.com.au  Fact Sheet for Healthcare Providers: IncredibleEmployment.be  This test is not yet approved or cleared by the [place] FDA and has been authorized for detection and/or diagnosis of SARS-CoV-2 by FDA under an Emergency Use Authorization (EUA). This EUA will remain in effect (meaning this test can be used) for the duration of the COVID-19 declaration under Section 564(b)(1) of the Act, 21 U.S.C. section 360bbb-3(b)(1), unless the authorization is terminated or revoked.  Performed at [facility], [place] [address],  [place], [place] [address]   Culture, blood (routine x 2)     Status: None   Collection Time: 06/11/20  7:16 AM   Specimen: BLOOD RIGHT FOREARM  Result Value Ref Range Status   Specimen Description BLOOD RIGHT FOREARM  Final   Special Requests   Final    BOTTLES DRAWN AEROBIC AND ANAEROBIC Blood Culture results may not be optimal due to an inadequate volume of blood received in culture bottles   Culture   Final    NO GROWTH 5 DAYS Performed at [facility], [place] [address], [place], [place] [address]    Report Status 06/16/2020 FINAL  Final  Culture, blood (routine x 2)     Status: None   Collection Time: 06/11/20  7:30 AM   Specimen: BLOOD LEFT HAND  Result Value Ref Range Status   Specimen Description BLOOD LEFT HAND  Final   Special Requests   Final    AEROBIC BOTTLE ONLY Blood Culture results may not be optimal due to an inadequate volume of blood received in culture bottles   Culture   Final    NO GROWTH 5 DAYS Performed at [facility], [place] [address] [place], [place] [address]    Report Status 06/16/2020 FINAL  Final  MRSA PCR Screening     Status: None   Collection Time: 06/12/20  6:28 AM   Specimen: Nasal Mucosa; Nasopharyngeal  Result Value Ref Range Status   MRSA by PCR NEGATIVE NEGATIVE Final    Comment:        The GeneXpert MRSA Assay (FDA approved for NASAL specimens only), is one component of a comprehensive MRSA colonization surveillance program. It is not intended to diagnose MRSA infection nor to guide or monitor treatment for MRSA infections. Performed at [facility], [place] [address], [place],  [address]     RADIOLOGY STUDIES/RESULTS: MR BRAIN WO CONTRAST  Result Date: 06/15/2020 CLINICAL DATA:  Mental status change. EXAM: MRI HEAD WITHOUT CONTRAST TECHNIQUE: Multiplanar, multiecho pulse sequences of the brain and surrounding structures were obtained without intravenous contrast. COMPARISON:  MRI of the brain February 22, 2019.  FINDINGS: Brain: No acute infarction, hemorrhage, hydrocephalus, extra-axial collection or mass lesion. Small area of encephalomalacia and gliosis in the right frontal lobe at the site of prior biopsy. There is small herniation of brain tissue in this location into the burr hole, unchanged. Scattered and confluent foci of T2 hyperintensity are seen within the white matter of the cerebral hemispheres, nonspecific, not significantly changed from prior MRI. Vascular: Normal flow voids. Skull and upper cervical spine: Diffuse decrease of the T1 signal within the calvarium may be related to red marrow reconversion in the setting of anemia. Burr hole in the right frontal region. Sinuses/Orbits: Negative. IMPRESSION: 1. No acute intracranial abnormality. 2. Stable small area of encephalomalacia and gliosis in the right frontal lobe at the site of prior biopsy. 3. Scattered and confluent foci of T2 hyperintensity within the  white matter of the cerebral hemispheres, nonspecific, but not significantly changed from prior MRI. Electronically Signed   By: Pedro Earls M.D.   On: 06/15/2020 15:50     LOS: 6 days   Lala Lund, MD  [facility]   06/17/2020, 9:16 AM

## 2020-06-17 NOTE — Progress Notes (Signed)
[facility] Progress Note  Subjective:  Seen in room. No new complaints today  Myoclonic jerks improved  Vitals:   06/17/20 0000 06/17/20 0400 06/17/20 0840 06/17/20 0843  BP: 128/72 123/71  127/73  Pulse: 94 65    Resp: 20 14    Temp: 98.6 F (37 C) 97.8 F (36.6 C)    TempSrc:  Oral    SpO2: 94% 99% 95%   Weight:      Height:        Exam: Gen alert, no distress No jvd or bruits Chest poor air movement in general, no rales/ wheezing RRR no MRG Abd soft ntnd no mass or ascites +bs Ext no LE edema Neuro is alert, Ox 3 , nf. No asterixis today   LUA AVF +bruit, no erythema/ drainage   Home meds:  - norvasc 10/ hydralazine 100 bid/ isordil 20 tid/ sl ntg prn  - prilosec 40 qd/ auryxia 210 tid ac/ lipitor 40 qd  - cymbalta 20 qd/ lyrica 100 tid/ seroquel 200 bid/ trazodone 50 hs prn/ percocet prn  - prn's/ vitamins/ supplements    CXR no acute disease   OP HD: [facility] MWF  3.5h  86kg  2/2 bath  Hep 2000  AVF  - hect 7ug tiw  - no esa  Assessment/ Plan: 1. Fever - high fever 103 in ED on admission.  No signs of access infection on exam. Fevers resolved. Pt completed short course of IV abx here. Blood cx's negative. Will not need any outpatient IV abx w/ HD.   2. Myoclonic jerking -  MS change noted.  Lyrica was dc'd. Some concern for uremic component. BUN/Cr have improved with HD x 2 here. Neuro consulted -repeat MRI unchanged from prior. Felt 2/2 uremic +/- toxic encpehpalopathy  Have listed Lyrica as an intolerance, pt should avoid  Lyrica and gabapentin in the future.   3. ESRD - on HD MWF. Back on schedule. Next HD 3/28.  4. HTN/ volume - cont BP meds. Needs std weight. CXR is negative. No gross vol excess on exam. Wt's high but not sure they are accurate.  5. Depression - on multiple psych meds, per pmd 6. CAD - on nitrates and sl ntg prn  7. Anemia ckd - not on esa at OP unit. Will order Aranesp 60 with HD 3/28.   8. MBD ckd - cont vdra and binder  Claris Che PA-C [facility] 06/17/2020,11:16 AM    Recent Labs  Lab 06/15/20 0017 06/16/20 0042  K 3.7 3.9  BUN 30* 16  CREATININE 8.11* 5.36*  CALCIUM 8.5* 9.2  PHOS 5.4* 3.9  HGB 8.5* 9.0*   Inpatient medications: . amLODipine  10 mg Oral Daily  . atorvastatin  40 mg Oral Daily  . Chlorhexidine Gluconate Cloth  6 each Topical Q0600  . doxercalciferol  7 mcg Intravenous Q M,W,F-HD  . DULoxetine  20 mg Oral Daily  . ferric citrate  210 mg Oral TID with meals  . heparin  5,000 Units Subcutaneous Q8H  . hydrALAZINE  100 mg Oral BID  . isosorbide dinitrate  20 mg Oral TID  . multivitamin  1 tablet Oral QHS  . pantoprazole  40 mg Oral Daily  . QUEtiapine  200 mg Oral BID  . sodium chloride flush  3 mL Intravenous Q12H    acetaminophen **OR** acetaminophen, albuterol, calcium carbonate (dosed in mg elemental calcium), camphor-menthol **AND** hydrOXYzine, docusate sodium, feeding supplement (NEPRO CARB  STEADY), loratadine, ondansetron **OR** ondansetron (ZOFRAN) IV, sorbitol, traZODone, zolpidem

## 2020-06-18 LAB — CBC
HCT: 25.9 % — ABNORMAL LOW (ref 36.0–46.0)
Hemoglobin: 8.8 g/dL — ABNORMAL LOW (ref 12.0–15.0)
MCH: 33.2 pg (ref 26.0–34.0)
MCHC: 34 g/dL (ref 30.0–36.0)
MCV: 97.7 fL (ref 80.0–100.0)
Platelets: 115 10*3/uL — ABNORMAL LOW (ref 150–400)
RBC: 2.65 MIL/uL — ABNORMAL LOW (ref 3.87–5.11)
RDW: 13 % (ref 11.5–15.5)
WBC: 7.7 10*3/uL (ref 4.0–10.5)
nRBC: 0 % (ref 0.0–0.2)

## 2020-06-18 NOTE — Care Management Important Message (Signed)
Important Message  Patient Details  Name: [name] [name] MRN: [id number] Date of Birth: March 19, 1956   Medicare Important Message Given:  Yes - Important Message mailed due to current National Emergency   Verbal consent obtained due to current National Emergency  Relationship to patient: Child Contact Name: Katreece Call Date: 06/18/20  Time: 1522 Phone: [phone] Outcome: Spoke with contact Important Message mailed to: Patient address on file    Delorse Lek 06/18/2020, 3:22 PM

## 2020-06-18 NOTE — Progress Notes (Signed)
PROGRESS NOTE        PATIENT DETAILS Name: [name] [name] Age: 65 y.o. Sex: female Date of Birth: 10-24-1955 Admit Date: 06/11/2020 Admitting Physician Karmen Bongo, MD PXT:[id number], [name], MD  Brief Narrative: Patient is a 65 y.o. female with history of ESRD, HTN, depression/anxiety-chronic hypoxic respiratory failure on 2.5 L of oxygen at home, NMDA encephalitis (2018)-presented with fever and weakness after dialysis.   See below for further details.   Significant events: 3/21>> admit for evaluation of fever/weakness post HD.   Significant studies: 3/21>> chest x-ray: No pneumonia 3/24>> EEG: No seizures-d mild diffuse encephalopathy 3/25 - MRI -  1. No acute intracranial abnormality. 2. Stable small area of encephalomalacia and gliosis in the right frontal lobe at the site of prior biopsy. 3. Scattered and confluent foci of T2 hyperintensity within the white matter of the cerebral hemispheres, nonspecific, but not significantly changed from prior MRI.   Microbiology data: 3/21>> blood culture: No growth 3/21>> influenza/Covid PCR: Negative  Antimicrobial therapy: Vancomycin: 3/21 x1 Cefepime: 3/21>> 3/24 Flagyl: 3/21 x 1  Procedures : None  Consults: Renal  DVT Prophylaxis : heparin injection 5,000 Units Start: 06/11/20 1400   Subjective:  Patient in bed, appears comfortable, denies any headache, no fever, no chest pain or pressure, no shortness of breath , no abdominal pain. No focal weakness.   Assessment/Plan:  SIRS/fever: No obvious foci of infection apparent yet-blood cultures remain negative-off all antimicrobial therapy.  Doubt she had sepsis physiology on admission  Waxing/waning encephalopathy-myoclonic jerks: Per family-ongoing for the past several weeks-initially attributed to possible uremia-and Lyrica use.  Dialyzed frequently-Lyrica discontinued-however continues to have intermittent encephalopathy-and myoclonic  jerks.  EEG unremarkable.  MRI with some nonspecific changes specifically scattered T2 hyperintensity in the white matter which is nonspecific but new finding, neurology following mentation is better with supportive care, discussed with neurologist Dr. Cheral Marker on 06/17/2020, patient continues to show improvement, will monitor another 24 hours if stable discharge home on 06/19/2020 post HD today.  ESRD on HD MWF: Nephrology following.  Case discussed with nephrology on 06/16/2020.  Normocytic anemia: Due to ESRD-defer Aranesp/IV iron to nephrology.  No evidence of GI loss at this point.  Thrombocytopenia: Mild-likely due to infectious issues-stable for close monitoring.  HTN: BP stable-continue Norvasc/hydralazine/Imdur.  HLD: Continue Lipitor  Depression/anxiety: Stable-continue Cymbalta/Seroquel/trazodone  Chronic hypoxic respiratory failure secondary to possible ILD: On 2.5 L of oxygen at all times.  Continue PCCM follow-up in the outpatient setting  Chronic pain syndrome: Stable-Lyrica discontinued.  History of NMDA encephalitis-s/p brain biopsy-followed by SDH/IPH along the surgical drain site in 2018 ([facility])  Diet: Diet Order            Diet renal with fluid restriction Fluid restriction: 1200 mL Fluid; Room service appropriate? Yes with Assist; Fluid consistency: Thin  Diet effective now           Diet - low sodium heart healthy                  Code Status: Full code   Family Communication: [phone] updated on 06/16/20 - bedside, 06/18/20  Disposition Plan: Status is: Inpatient  Remains inpatient appropriate because:Inpatient level of care appropriate due to severity of illness    Dispo: The patient is from: Home  Anticipated d/c is to: Home              Patient currently is not medically stable to d/c.   Difficult to place patient No    Barriers to Discharge: Sepsis-unknown foci-remains on IV antibiotics-if  remains stable-possible discharge in the next 1-2 days.  Antimicrobial agents: Anti-infectives (From admission, onward)   Start     Dose/Rate Route Frequency Ordered Stop   06/15/20 1800  ceFEPIme (MAXIPIME) 2 g in sodium chloride 0.9 % 100 mL IVPB  Status:  Discontinued        2 g 200 mL/hr over 30 Minutes Intravenous Every M-W-F 06/14/20 0756 06/14/20 1104   06/15/20 1200  ceFEPIme (MAXIPIME) 2 g in sodium chloride 0.9 % 100 mL IVPB  Status:  Discontinued        2 g 200 mL/hr over 30 Minutes Intravenous Every M-W-F (Hemodialysis) 06/13/20 1109 06/14/20 0756   06/14/20 1800  ceFEPIme (MAXIPIME) 1 g in sodium chloride 0.9 % 100 mL IVPB  Status:  Discontinued        1 g 200 mL/hr over 30 Minutes Intravenous  Once 06/14/20 0756 06/14/20 1101   06/14/20 1400  ceFEPIme (MAXIPIME) 2 g in sodium chloride 0.9 % 100 mL IVPB        2 g 200 mL/hr over 30 Minutes Intravenous  Once 06/14/20 1101 06/14/20 1329   06/14/20 1200  ceFEPIme (MAXIPIME) 1 g in sodium chloride 0.9 % 100 mL IVPB  Status:  Discontinued        1 g 200 mL/hr over 30 Minutes Intravenous  Once 06/14/20 0739 06/14/20 0756   06/13/20 2000  ceFEPIme (MAXIPIME) 1 g in sodium chloride 0.9 % 100 mL IVPB  Status:  Discontinued        1 g 200 mL/hr over 30 Minutes Intravenous Every 24 hours 06/13/20 1109 06/14/20 0737   06/13/20 1200  vancomycin (VANCOREADY) IVPB 1000 mg/200 mL  Status:  Discontinued        1,000 mg 200 mL/hr over 60 Minutes Intravenous Every M-W-F (Hemodialysis) 06/11/20 0744 06/14/20 0756   06/12/20 0800  ceFEPIme (MAXIPIME) 1 g in sodium chloride 0.9 % 100 mL IVPB  Status:  Discontinued        1 g 200 mL/hr over 30 Minutes Intravenous Every 24 hours 06/11/20 0744 06/13/20 1109   06/11/20 0800  vancomycin (VANCOREADY) IVPB 1500 mg/300 mL  Status:  Discontinued        1,500 mg 150 mL/hr over 120 Minutes Intravenous  Once 06/11/20 0739 06/11/20 0743   06/11/20 0800  vancomycin (VANCOREADY) IVPB 2000 mg/400 mL         2,000 mg 200 mL/hr over 120 Minutes Intravenous  Once 06/11/20 0743 06/11/20 1104   06/11/20 0745  ceFEPIme (MAXIPIME) 2 g in sodium chloride 0.9 % 100 mL IVPB        2 g 200 mL/hr over 30 Minutes Intravenous  Once 06/11/20 0739 06/11/20 0904   06/11/20 0745  metroNIDAZOLE (FLAGYL) IVPB 500 mg        500 mg 100 mL/hr over 60 Minutes Intravenous  Once 06/11/20 0739 06/11/20 8101       Time spent: 25 minutes-Greater than 50% of this time was spent in counseling, explanation of diagnosis, planning of further management, and coordination of care.  MEDICATIONS: Scheduled Meds: . amLODipine  10 mg Oral Daily  . atorvastatin  40 mg Oral Daily  . Chlorhexidine Gluconate Cloth  6 each Topical Q0600  .  darbepoetin (ARANESP) injection - DIALYSIS  60 mcg Intravenous Q Mon-HD  . doxercalciferol  7 mcg Intravenous Q M,W,F-HD  . DULoxetine  20 mg Oral Daily  . ferric citrate  210 mg Oral TID with meals  . heparin  5,000 Units Subcutaneous Q8H  . hydrALAZINE  100 mg Oral BID  . isosorbide dinitrate  20 mg Oral TID  . multivitamin  1 tablet Oral QHS  . pantoprazole  40 mg Oral Daily  . QUEtiapine  200 mg Oral BID  . sodium chloride flush  3 mL Intravenous Q12H   Continuous Infusions:  PRN Meds:.acetaminophen **OR** acetaminophen, albuterol, calcium carbonate (dosed in mg elemental calcium), camphor-menthol **AND** hydrOXYzine, docusate sodium, feeding supplement (NEPRO CARB STEADY), loratadine, ondansetron **OR** ondansetron (ZOFRAN) IV, sorbitol, traZODone, zolpidem   PHYSICAL EXAM: Vital signs: Vitals:   06/17/20 1316 06/17/20 1937 06/17/20 2211 06/18/20 0517  BP: 120/82 (!) 143/83 (!) 163/96 (!) 147/84  Pulse: 85 78  84  Resp: 17 16  (!) 21  Temp: 97.8 F (36.6 C) 98.5 F (36.9 C)  98.4 F (36.9 C)  TempSrc: Oral Oral  Oral  SpO2: 90% 100%    Weight:      Height:       Filed Weights   06/14/20 1140 06/15/20 1144 06/16/20 0400  Weight: 91.7 kg 90.7 kg 92.3 kg   Body mass  index is 32.84 kg/m.   Gen Exam:   Awake Alert, No new F.N deficits, Normal affect [place].AT,PERRAL Supple Neck,No JVD, No cervical lymphadenopathy appriciated.  Symmetrical Chest wall movement, Good air movement bilaterally, CTAB RRR,No Gallops, Rubs or new Murmurs, No Parasternal Heave +ve B.Sounds, Abd Soft, No tenderness, No organomegaly appriciated, No rebound - guarding or rigidity. No Cyanosis, Clubbing or edema, No new Rash or bruise  I have personally reviewed following labs and imaging studies  LABORATORY DATA: CBC: Recent Labs  Lab 06/13/20 0156 06/14/20 0117 06/15/20 0017 06/16/20 0042 06/18/20 0038  WBC 9.4 7.8 6.0 6.7 7.7  HGB 8.7* 8.3* 8.5* 9.0* 8.8*  HCT 26.2* 25.1* 25.4* 26.7* 25.9*  MCV 100.4* 97.7 97.3 98.2 97.7  PLT 97* 96* 92* 112* 115*    Basic Metabolic Panel: Recent Labs  Lab 06/13/20 0156 06/14/20 0117 06/14/20 1508 06/15/20 0017 06/16/20 0042  NA 137 134* 133* 133* 136  K 3.8 3.9 3.7 3.7 3.9  CL 95* 92* 95* 95* 97*  CO2 23 23 25 25 29   GLUCOSE 127* 108* 104* 118* 130*  BUN 66* 77* 26* 30* 16  CREATININE 13.09* 14.17* 7.00* 8.11* 5.36*  CALCIUM 7.8* 7.7* 8.4* 8.5* 9.2  MG  --   --   --   --  2.2  PHOS 9.8* 10.3* 4.7* 5.4* 3.9    GFR: Estimated Creatinine Clearance: 12.5 mL/min (A) (by C-G formula based on SCr of 5.36 mg/dL (H)).  Liver Function Tests: Recent Labs  Lab 06/13/20 0156 06/14/20 0117 06/14/20 1508 06/15/20 0017 06/16/20 0042  ALBUMIN 2.9* 2.9* 2.9* 2.8* 2.9*   No results for input(s): LIPASE, AMYLASE in the last 168 hours. Recent Labs  Lab 06/14/20 1508  AMMONIA 15    Coagulation Profile: No results for input(s): INR, PROTIME in the last 168 hours.  Cardiac Enzymes: No results for input(s): CKTOTAL, CKMB, CKMBINDEX, TROPONINI in the last 168 hours.  BNP (last 3 results) No results for input(s): PROBNP in the last 8760 hours.  Lipid Profile: No results for input(s): CHOL, HDL, LDLCALC, TRIG, CHOLHDL,  LDLDIRECT in the last 72  hours.  Thyroid Function Tests: No results for input(s): TSH, T4TOTAL, FREET4, T3FREE, THYROIDAB in the last 72 hours.  Anemia Panel: Recent Labs    06/16/20 0042  FERRITIN 1,657*  TIBC 171*  IRON 84  RETICCTPCT 1.8    Urine analysis:    Component Value Date/Time   COLORURINE YELLOW 06/12/2020 1600   APPEARANCEUR CLOUDY (A) 06/12/2020 1600   LABSPEC 1.016 06/12/2020 1600   PHURINE 5.0 06/12/2020 1600   GLUCOSEU NEGATIVE 06/12/2020 1600   HGBUR SMALL (A) 06/12/2020 1600   BILIRUBINUR NEGATIVE 06/12/2020 1600   BILIRUBINUR negative 07/23/2017 1536   KETONESUR NEGATIVE 06/12/2020 1600   PROTEINUR >=300 (A) 06/12/2020 1600   UROBILINOGEN 0.2 07/23/2017 1536   UROBILINOGEN 0.2 09/16/2014 1330   NITRITE NEGATIVE 06/12/2020 1600   LEUKOCYTESUR LARGE (A) 06/12/2020 1600    Sepsis Labs: Lactic Acid, Venous    Component Value Date/Time   LATICACIDVEN 1.7 06/11/2020 1007    MICROBIOLOGY: Recent Results (from the past 240 hour(s))  Resp Panel by RT-PCR (Flu A&B, Covid) Nasopharyngeal Swab     Status: None   Collection Time: 06/11/20  7:12 AM   Specimen: Nasopharyngeal Swab; Nasopharyngeal(NP) swabs in vial transport medium  Result Value Ref Range Status   SARS Coronavirus 2 by RT PCR NEGATIVE NEGATIVE Final    Comment: (NOTE) SARS-CoV-2 target nucleic acids are NOT DETECTED.  The SARS-CoV-2 RNA is generally detectable in upper respiratory specimens during the acute phase of infection. The lowest concentration of SARS-CoV-2 viral copies this assay can detect is 138 copies/mL. A negative result does not preclude SARS-Cov-2 infection and should not be used as the sole basis for treatment or other patient management decisions. A negative result may occur with  improper specimen collection/handling, submission of specimen other than nasopharyngeal swab, presence of viral mutation(s) within the areas targeted by this assay, and inadequate number of  viral copies(<138 copies/mL). A negative result must be combined with clinical observations, patient history, and epidemiological information. The expected result is Negative.  Fact Sheet for Patients:  EntrepreneurPulse.com.au  Fact Sheet for Healthcare Providers:  IncredibleEmployment.be  This test is no t yet approved or cleared by the [place] FDA and  has been authorized for detection and/or diagnosis of SARS-CoV-2 by FDA under an Emergency Use Authorization (EUA). This EUA will remain  in effect (meaning this test can be used) for the duration of the COVID-19 declaration under Section 564(b)(1) of the Act, 21 U.S.C.section 360bbb-3(b)(1), unless the authorization is terminated  or revoked sooner.       Influenza A by PCR NEGATIVE NEGATIVE Final   Influenza B by PCR NEGATIVE NEGATIVE Final    Comment: (NOTE) The Xpert Xpress SARS-CoV-2/FLU/RSV plus assay is intended as an aid in the diagnosis of influenza from Nasopharyngeal swab specimens and should not be used as a sole basis for treatment. Nasal washings and aspirates are unacceptable for Xpert Xpress SARS-CoV-2/FLU/RSV testing.  Fact Sheet for Patients: EntrepreneurPulse.com.au  Fact Sheet for Healthcare Providers: IncredibleEmployment.be  This test is not yet approved or cleared by the [place] FDA and has been authorized for detection and/or diagnosis of SARS-CoV-2 by FDA under an Emergency Use Authorization (EUA). This EUA will remain in effect (meaning this test can be used) for the duration of the COVID-19 declaration under Section 564(b)(1) of the Act, 21 U.S.C. section 360bbb-3(b)(1), unless the authorization is terminated or revoked.  Performed at [facility], [place] [address], [place], [place] [address]   Culture, blood (  routine x 2)     Status: None   Collection Time: 06/11/20  7:16 AM   Specimen: BLOOD RIGHT  FOREARM  Result Value Ref Range Status   Specimen Description BLOOD RIGHT FOREARM  Final   Special Requests   Final    BOTTLES DRAWN AEROBIC AND ANAEROBIC Blood Culture results may not be optimal due to an inadequate volume of blood received in culture bottles   Culture   Final    NO GROWTH 5 DAYS Performed at [facility], [place] [address], [place], [place] [address]    Report Status 06/16/2020 FINAL  Final  Culture, blood (routine x 2)     Status: None   Collection Time: 06/11/20  7:30 AM   Specimen: BLOOD LEFT HAND  Result Value Ref Range Status   Specimen Description BLOOD LEFT HAND  Final   Special Requests   Final    AEROBIC BOTTLE ONLY Blood Culture results may not be optimal due to an inadequate volume of blood received in culture bottles   Culture   Final    NO GROWTH 5 DAYS Performed at [facility], [place] [address] [place], [place] [address]    Report Status 06/16/2020 FINAL  Final  MRSA PCR Screening     Status: None   Collection Time: 06/12/20  6:28 AM   Specimen: Nasal Mucosa; Nasopharyngeal  Result Value Ref Range Status   MRSA by PCR NEGATIVE NEGATIVE Final    Comment:        The GeneXpert MRSA Assay (FDA approved for NASAL specimens only), is one component of a comprehensive MRSA colonization surveillance program. It is not intended to diagnose MRSA infection nor to guide or monitor treatment for MRSA infections. Performed at [facility], [place] [address], [place], [place] [address]     RADIOLOGY STUDIES/RESULTS: No results found.   LOS: 7 days   Lala Lund, MD  [facility]   06/18/2020, 8:32 AM

## 2020-06-18 NOTE — Progress Notes (Signed)
[name] Jallow, RN Called and notified the Pt's HD tx has been moved to 06/19/2020.

## 2020-06-18 NOTE — Progress Notes (Signed)
Physical Therapy Treatment Patient Details Name: [name] [name] MRN: [id number] DOB: 1956-01-28 Today's Date: 06/18/2020    History of Present Illness Pt is a 65 y.o. female who presented 3/21 with weakness and SOB following her HD appointment. She was admitted with sepsis and suspected PNA, even though CXR was negative. Additionally pt with Waxing/waning encephalopathy-myoclonic jerks.  Pt had MRI on 06/15/20 that was negative for acute changes.  PMH: seizure, HTN, HLD, bil jugular vein thrombosis, NMDA encephalitis, SDH, ILD on 2.5L home O2, DM, depression/anxiety, and ESRD on MWF HD.    PT Comments    Pt was surprised to have PT prior to HD, but agreeable to OOB and stair training. Pt ambulated in hallway with close guard and occasional min assist to steady, pt requiring standing rest breaks and seated rest break x1. Pt insisted on not using RW, pt with improving balance during gait without RW but suspect pt will need RW for support during community distances. PT encouraged pt's daughter to be with her for all mobility at d/c to decrease fall risk. Pt proficiently navigated steps, showing ability to enter home at d/c. Pt with x1 moment of LEs feeling like they were "giving out" during steps, pt-corrected with use of rails. On 2-3LO2 during session, DOE 2/4 recovered with rest and breathing technique. SpO2 90% and greater throughout session. PT continuing to recommend post-acute PT, will continue to follow acutely.     Follow Up Recommendations  Home health PT;Supervision for mobility/OOB     Equipment Recommendations  Rolling walker with 5" wheels    Recommendations for Other Services       Precautions / Restrictions Precautions Precautions: Fall Precaution Comments: sats, tremors/buckling Restrictions Weight Bearing Restrictions: No    Mobility  Bed Mobility Overal bed mobility: Needs Assistance Bed Mobility: Supine to Sit     Supine to sit: Modified independent  (Device/Increase time)     General bed mobility comments: mod I for increased time and use of bedrails    Transfers Overall transfer level: Needs assistance Equipment used: None Transfers: Sit to/from Stand Sit to Stand: Min guard         General transfer comment: for safety, STS x2 from EOB and steps after stair training. Requires assist for slow descent to lower onto steps to rest.  Ambulation/Gait Ambulation/Gait assistance: Min guard;Min assist Gait Distance (Feet): 130 Feet (x2) Assistive device: None Gait Pattern/deviations: Step-through pattern;Narrow base of support;Drifts right/left;Decreased stride length Gait velocity: decr   General Gait Details: min guard for safety, occasional min assist for steadying and cuing pt to reach for chair rail in hallway. Seated rest break x1 for 2 minutes to recover DOE 2/4, SpO2 90 and above on 2-3LO2 (2.5L is baseline). Multiple intermittent standing rest breaks to recover fatigue. No episodes of buckling during gait   Stairs Stairs: Yes Stairs assistance: Min guard Stair Management: One rail Right;Two rails;Step to pattern;Forwards Number of Stairs: 12 General stair comments: min guard for safety, pt sequencing with step-to and occasional alternating pattern. Cues for pt to use railings, take her time, rest as needed to recover dyspnea. x1 episode of LE buckling when eccentrically lowering LEs during descent.   Wheelchair Mobility    Modified Rankin (Stroke Patients Only)       Balance Overall balance assessment: Needs assistance Sitting-balance support: No upper extremity supported;Feet supported Sitting balance-Leahy Scale: Good     Standing balance support: No upper extremity supported;During functional activity Standing balance-Leahy Scale: Fair Standing balance comment:  ambulatory without AD, occasional min assist                            Cognition Arousal/Alertness: Awake/alert Behavior During  Therapy: WFL for tasks assessed/performed Overall Cognitive Status: Impaired/Different from baseline Area of Impairment: Safety/judgement                         Safety/Judgement: Decreased awareness of deficits;Decreased awareness of safety     General Comments: Pt lacks insight into deficits, requires safety cues intermittently.      Exercises      General Comments General comments (skin integrity, edema, etc.): on 2-3LO2 during session, DOE 2/4 recovered with rest and breathing technique. SpO2 90% and greater      Pertinent Vitals/Pain Pain Assessment: No/denies pain Pain Intervention(s): Monitored during session    Home Living                      Prior Function            PT Goals (current goals can now be found in the care plan section) Acute Rehab PT Goals Patient Stated Goal: to go home PT Goal Formulation: With patient/family Time For Goal Achievement: 06/26/20 Potential to Achieve Goals: Good Progress towards PT goals: Progressing toward goals    Frequency    Min 3X/week      PT Plan Current plan remains appropriate    Co-evaluation              AM-PAC PT "6 Clicks" Mobility   Outcome Measure  Help needed turning from your back to your side while in a flat bed without using bedrails?: None Help needed moving from lying on your back to sitting on the side of a flat bed without using bedrails?: None Help needed moving to and from a bed to a chair (including a wheelchair)?: A Little Help needed standing up from a chair using your arms (e.g., wheelchair or bedside chair)?: A Little Help needed to walk in hospital room?: A Little Help needed climbing 3-5 steps with a railing? : A Little 6 Click Score: 20    End of Session Equipment Utilized During Treatment: Oxygen;Gait belt Activity Tolerance: Patient limited by fatigue Patient left: with call bell/phone within reach;in chair;with chair alarm set Nurse Communication:  Mobility status PT Visit Diagnosis: Unsteadiness on feet (R26.81);Other abnormalities of gait and mobility (R26.89);Difficulty in walking, not elsewhere classified (R26.2)     Time: 6767-2094 PT Time Calculation (min) (ACUTE ONLY): 14 min  Charges:  $Gait Training: 8-22 mins                     Stacie Glaze, PT Acute Rehabilitation Services Pager [phone]  Office [phone]    [place] 06/18/2020, 12:06 PM

## 2020-06-18 NOTE — Progress Notes (Addendum)
Subjective: No complaints sitting up in bedside chair, noted  myoclonic jerks resolved this a.m.  Objective Vital signs in last 24 hours: Vitals:   06/17/20 1937 06/17/20 2211 06/18/20 0517 06/18/20 0843  BP: (!) 143/83 (!) 163/96 (!) 147/84   Pulse: 78  84   Resp: 16  (!) 21   Temp: 98.5 F (36.9 C)  98.4 F (36.9 C)   TempSrc: Oral  Oral   SpO2: 100%   100%  Weight:      Height:       Weight change:   Physical Exam: General: Adult female alert NAD Heart: RRR, no MRG Lungs: CTA ,nonlabored breathing Abdomen: Obese, bowel sounds normoactive, soft ND NT Extremities: No pedal edema  Dialysis Access: LUA  AV F+ bruit  Home meds: - norvasc 10/ hydralazine 100 bid/ isordil 20 tid/ sl ntg prn - prilosec 40 qd/ auryxia 210 tid ac/ lipitor 40 qd - cymbalta 20 qd/ lyrica 100 tid/ seroquel 200 bid/ trazodone 50 hs prn/ percocet prn - prn's/ vitamins/ supplements  CXR no acute disease  OP RS:WNIO MWF 3.5h 86kg 2/2 bath Hep 2000 AVF - hect 7ug tiw - no esa  Problem/Plan: 1. Fever -admit  fever 103 in ED  No signs of access infection on exam. Fevers resolved. Pt completed short course of IV abx here. Blood cx's negative. Will not need any outpatient IV abx w/ HD.   2. Myoclonic jerking -  MS change noted.  Lyrica was dc'd.  Jerking resolved now, some concern for uremic component. BUN/Cr have improved with HD x 2 here. Neuro consulted -repeat MRI unchanged from prior. Felt 2/2 uremic +/- toxic encpehpalopathy  Have listed Lyrica as an intolerance, pt should avoid  Lyrica and gabapentin in the future.   3. ESRD - on HD MWF on schedule  4. HTN/ volume - cont BP meds. Needs std weight. CXR is negative. No gross vol excess on exam. Wt's high but not sure they are accurate.  5. Depression - on multiple psych meds, per pmd 6. CAD - on nitrates and sl ntg prn 7. Anemia ckd -Hgb 8.8 not on esa at OP unit. Restart Aranesp 60 MCG with Monday dialysis start today 8. MBD ckd  - cont vdra and binder Maxie Better, PA-C [place] [phone] 06/18/2020,11:07 AM  LOS: 7 days   Labs: Basic Metabolic Panel: Recent Labs  Lab 06/14/20 1508 06/15/20 0017 06/16/20 0042  NA 133* 133* 136  K 3.7 3.7 3.9  CL 95* 95* 97*  CO2 25 25 29   GLUCOSE 104* 118* 130*  BUN 26* 30* 16  CREATININE 7.00* 8.11* 5.36*  CALCIUM 8.4* 8.5* 9.2  PHOS 4.7* 5.4* 3.9   Liver Function Tests: Recent Labs  Lab 06/14/20 1508 06/15/20 0017 06/16/20 0042  ALBUMIN 2.9* 2.8* 2.9*   No results for input(s): LIPASE, AMYLASE in the last 168 hours. Recent Labs  Lab 06/14/20 1508  AMMONIA 15   CBC: Recent Labs  Lab 06/13/20 0156 06/14/20 0117 06/15/20 0017 06/16/20 0042 06/18/20 0038  WBC 9.4 7.8 6.0 6.7 7.7  HGB 8.7* 8.3* 8.5* 9.0* 8.8*  HCT 26.2* 25.1* 25.4* 26.7* 25.9*  MCV 100.4* 97.7 97.3 98.2 97.7  PLT 97* 96* 92* 112* 115*   Cardiac Enzymes: No results for input(s): CKTOTAL, CKMB, CKMBINDEX, TROPONINI in the last 168 hours. CBG: Recent Labs  Lab 06/11/20 1809  GLUCAP 106*    Studies/Results: No results found. Medications:  . amLODipine  10  mg Oral Daily  . atorvastatin  40 mg Oral Daily  . Chlorhexidine Gluconate Cloth  6 each Topical Q0600  . darbepoetin (ARANESP) injection - DIALYSIS  60 mcg Intravenous Q Mon-HD  . doxercalciferol  7 mcg Intravenous Q M,W,F-HD  . DULoxetine  20 mg Oral Daily  . ferric citrate  210 mg Oral TID with meals  . heparin  5,000 Units Subcutaneous Q8H  . hydrALAZINE  100 mg Oral BID  . isosorbide dinitrate  20 mg Oral TID  . multivitamin  1 tablet Oral QHS  . pantoprazole  40 mg Oral Daily  . QUEtiapine  200 mg Oral BID  . sodium chloride flush  3 mL Intravenous Q12H    I have personally seen and examined this patient and agree with the assessment/plan as outlined below by Ernest Haber, PA-C.Reesa Chew, MD 06/18/2020 11:27 AM

## 2020-06-19 LAB — CBC
HCT: 25.6 % — ABNORMAL LOW (ref 36.0–46.0)
Hemoglobin: 8.2 g/dL — ABNORMAL LOW (ref 12.0–15.0)
MCH: 31.5 pg (ref 26.0–34.0)
MCHC: 32 g/dL (ref 30.0–36.0)
MCV: 98.5 fL (ref 80.0–100.0)
Platelets: 117 10*3/uL — ABNORMAL LOW (ref 150–400)
RBC: 2.6 MIL/uL — ABNORMAL LOW (ref 3.87–5.11)
RDW: 13 % (ref 11.5–15.5)
WBC: 7.7 10*3/uL (ref 4.0–10.5)
nRBC: 0 % (ref 0.0–0.2)

## 2020-06-19 LAB — RENAL FUNCTION PANEL
Albumin: 2.9 g/dL — ABNORMAL LOW (ref 3.5–5.0)
Anion gap: 15 (ref 5–15)
BUN: 50 mg/dL — ABNORMAL HIGH (ref 8–23)
CO2: 22 mmol/L (ref 22–32)
Calcium: 8.9 mg/dL (ref 8.9–10.3)
Chloride: 95 mmol/L — ABNORMAL LOW (ref 98–111)
Creatinine, Ser: 10.85 mg/dL — ABNORMAL HIGH (ref 0.44–1.00)
GFR, Estimated: 4 mL/min — ABNORMAL LOW (ref >60)
Glucose, Bld: 108 mg/dL — ABNORMAL HIGH (ref 70–99)
Phosphorus: 6.7 mg/dL — ABNORMAL HIGH (ref 2.5–4.6)
Potassium: 3.9 mmol/L (ref 3.5–5.1)
Sodium: 132 mmol/L — ABNORMAL LOW (ref 135–145)

## 2020-06-19 MED ORDER — DARBEPOETIN ALFA 60 MCG/0.3ML IJ SOSY
60.0000 ug | PREFILLED_SYRINGE | Freq: Once | INTRAMUSCULAR | Status: AC
  Administered 2020-06-19: 60 ug via INTRAVENOUS

## 2020-06-19 MED ORDER — DOXERCALCIFEROL 4 MCG/2ML IV SOLN
INTRAVENOUS | Status: AC
  Filled 2020-06-19: qty 4

## 2020-06-19 MED ORDER — DARBEPOETIN ALFA 60 MCG/0.3ML IJ SOSY
PREFILLED_SYRINGE | INTRAMUSCULAR | Status: AC
  Filled 2020-06-19: qty 0.3

## 2020-06-19 NOTE — Progress Notes (Signed)
Patient discharged home.  Discharge instructions explained to both patient and daughter (present in room at time of discharge).  Both verbalize understanding.

## 2020-06-19 NOTE — Consult Note (Signed)
   [facility] CM Inpatient Consult   06/19/2020  JARETZI DROZ 03/08/1956 [id number]  Patient chart reviewed for potential [place] [facility] CM) services due to high unplanned readmission risk score. Per review, patient has primary care provider services at [facility]. [facility] CM embedded chronic case management team provides ambulatory services to PCP patients.   Plan: Will notify RN case coordinator of patient admission and disposition.  Of note, [facility] Care Management services does not replace or interfere with any services that are arranged by inpatient case management or social work.  Netta Cedars, MSN, [facility] Liaison Nurse Mobile Phone [phone]  Toll free office [phone]

## 2020-06-19 NOTE — Discharge Instructions (Signed)
Follow with Primary MD Midge Minium, MD in 7 days   Get CBC, CMP, 2 view Chest X ray -  checked next visit within 1 week by Primary MD   Activity: As tolerated with Full fall precautions use walker/cane & assistance as needed  Disposition Home   Diet: Renal diet with 1.5 L/day total fluid restriction.  Special Instructions: If you have smoked or chewed Tobacco  in the last 2 yrs please stop smoking, stop any regular Alcohol  and or any Recreational drug use.  On your next visit with your primary care physician please Get Medicines reviewed and adjusted.  Please request your Prim.MD to go over all Hospital Tests and Procedure/Radiological results at the follow up, please get all Hospital records sent to your Prim MD by signing hospital release before you go home.  If you experience worsening of your admission symptoms, develop shortness of breath, life threatening emergency, suicidal or homicidal thoughts you must seek medical attention immediately by calling 911 or calling your MD immediately  if symptoms less severe.  You Must read complete instructions/literature along with all the possible adverse reactions/side effects for all the Medicines you take and that have been prescribed to you. Take any new Medicines after you have completely understood and accpet all the possible adverse reactions/side effects.

## 2020-06-19 NOTE — Progress Notes (Signed)
Subjective: Unable to do dialysis yesterday secondary patient load,   seen on dialysis complaining of cough when supine= will challenge 3 L UF, patient agreeing, seen toward end dialysis with cough resolved with UF tolerated ,however patient  signing off  15 minutes early stating she is tired wants to come off early and eat, discussed with her compliance with hemodialysis treatment time and volume compliance.  Objective Vital signs in last 24 hours: Vitals:   06/19/20 1100 06/19/20 1110 06/19/20 1118 06/19/20 1142  BP: (!) 145/80 (!) 145/83 (!) 163/81   Pulse: 79 79 81   Resp: 18 18 18    Temp:      TempSrc:      SpO2:   99%   Weight:   90.7 kg 92.4 kg  Height:       Weight change:   Physical Exam: General: Adult female alert NAD, on dialysis intermittent cough resolved with dialysis UF Heart: RRR, no MRG Lungs:  Bilateral expiratory wheeze , intermittent cough ,nonlabored breathing Abdomen: Obese, bowel sounds normoactive, soft ND NT Extremities: Trace bipedal pedal edema  Dialysis Access: LUA  AV F+ bruit  Home meds: - norvasc 10/ hydralazine 100 bid/ isordil 20 tid/ sl ntg prn - prilosec 40 qd/ auryxia 210 tid ac/ lipitor 40 qd - cymbalta 20 qd/ lyrica 100 tid/ seroquel 200 bid/ trazodone 50 hs prn/ percocet prn - prn's/ vitamins/ supplements  CXR no acute disease  OP WN:UUVO MWF 3.5h 86kg 2/2 bath Hep 2000 AVF - hect 7ug tiw - no esa  Problem/Plan: 1. Fever -admit  fever 103 in ED No signs of access infection on exam. Fevers resolved. Pt completed short course of IV abx here. Blood cx's negative. Will not need any outpatient IV abx w/ HD.  2. Myoclonic jerking - MS change noted and now resolved. Lyrica was dc'd.  Jerking resolved now, some concern foruremic component. BUN/Cr have improved with HD x 2 here. Neuro consulted -repeat MRI unchanged from prior. Felt 2/2 uremic +/- toxic encpehpalopathyHave listed Lyrica as an intolerance, pt should  avoid Lyrica and gabapentin in the future. 3. ESRD - on HD MWF on schedule  4. HTN/ volume - cont BP meds. Needs std weight. CXR is negative. . Wt's high but not sure they are accurate.  5. Depression - on multiple psych meds, per pmd 6. CAD - on nitrates and sl ntg prn 7. Anemia ckd -Hgb 8.8 not on esa at OP unit. Restart Aranesp 60 MCG with Monday dialysis start today 8. MBD ckd - cont vdra and binder Maxie Better, PA-C [place] [phone] 06/19/2020,11:46 AM  LOS: 8 days   Labs: Basic Metabolic Panel: Recent Labs  Lab 06/15/20 0017 06/16/20 0042 06/19/20 0820  NA 133* 136 132*  K 3.7 3.9 3.9  CL 95* 97* 95*  CO2 25 29 22   GLUCOSE 118* 130* 108*  BUN 30* 16 50*  CREATININE 8.11* 5.36* 10.85*  CALCIUM 8.5* 9.2 8.9  PHOS 5.4* 3.9 6.7*   Liver Function Tests: Recent Labs  Lab 06/15/20 0017 06/16/20 0042 06/19/20 0820  ALBUMIN 2.8* 2.9* 2.9*   No results for input(s): LIPASE, AMYLASE in the last 168 hours. Recent Labs  Lab 06/14/20 1508  AMMONIA 15   CBC: Recent Labs  Lab 06/14/20 0117 06/15/20 0017 06/16/20 0042 06/18/20 0038 06/19/20 0819  WBC 7.8 6.0 6.7 7.7 7.7  HGB 8.3* 8.5* 9.0* 8.8* 8.2*  HCT 25.1* 25.4* 26.7* 25.9* 25.6*  MCV 97.7  97.3 98.2 97.7 98.5  PLT 96* 92* 112* 115* 117*   Cardiac Enzymes: No results for input(s): CKTOTAL, CKMB, CKMBINDEX, TROPONINI in the last 168 hours. CBG: No results for input(s): GLUCAP in the last 168 hours.  Studies/Results: No results found. Medications:  . amLODipine  10 mg Oral Daily  . atorvastatin  40 mg Oral Daily  . Chlorhexidine Gluconate Cloth  6 each Topical Q0600  . darbepoetin (ARANESP) injection - DIALYSIS  60 mcg Intravenous Q Mon-HD  . [COMPLETED] darbepoetin (ARANESP) injection - DIALYSIS  60 mcg Intravenous Once  . doxercalciferol  7 mcg Intravenous Q M,W,F-HD  . DULoxetine  20 mg Oral Daily  . ferric citrate  210 mg Oral TID with meals  . heparin   5,000 Units Subcutaneous Q8H  . hydrALAZINE  100 mg Oral BID  . isosorbide dinitrate  20 mg Oral TID  . multivitamin  1 tablet Oral QHS  . pantoprazole  40 mg Oral Daily  . QUEtiapine  200 mg Oral BID

## 2020-06-19 NOTE — Procedures (Signed)
I was present at this dialysis session. I have reviewed the session itself and made appropriate changes.   Filed Weights   06/19/20 0730 06/19/20 1118 06/19/20 1142  Weight: 92.5 kg 90.7 kg 92.4 kg    Recent Labs  Lab 06/19/20 0820  NA 132*  K 3.9  CL 95*  CO2 22  GLUCOSE 108*  BUN 50*  CREATININE 10.85*  CALCIUM 8.9  PHOS 6.7*    Recent Labs  Lab 06/16/20 0042 06/18/20 0038 06/19/20 0819  WBC 6.7 7.7 7.7  HGB 9.0* 8.8* 8.2*  HCT 26.7* 25.9* 25.6*  MCV 98.2 97.7 98.5  PLT 112* 115* 117*    Scheduled Meds: . amLODipine  10 mg Oral Daily  . atorvastatin  40 mg Oral Daily  . Chlorhexidine Gluconate Cloth  6 each Topical Q0600  . darbepoetin (ARANESP) injection - DIALYSIS  60 mcg Intravenous Q Mon-HD  . doxercalciferol  7 mcg Intravenous Q M,W,F-HD  . DULoxetine  20 mg Oral Daily  . ferric citrate  210 mg Oral TID with meals  . heparin  5,000 Units Subcutaneous Q8H  . hydrALAZINE  100 mg Oral BID  . isosorbide dinitrate  20 mg Oral TID  . multivitamin  1 tablet Oral QHS  . pantoprazole  40 mg Oral Daily  . QUEtiapine  200 mg Oral BID   Continuous Infusions: PRN Meds:.acetaminophen **OR** [DISCONTINUED] acetaminophen, albuterol, calcium carbonate (dosed in mg elemental calcium), camphor-menthol **AND** hydrOXYzine, docusate sodium, feeding supplement (NEPRO CARB STEADY), [DISCONTINUED] ondansetron **OR** ondansetron (ZOFRAN) IV, sorbitol, traZODone   Santiago Bumpers,  MD 06/19/2020, 12:29 PM

## 2020-06-19 NOTE — Progress Notes (Signed)
PT Cancellation Note  Patient Details Name: [name] [name] MRN: [id number] DOB: October 20, 1955   Cancelled Treatment:    Reason Eval/Treat Not Completed: Patient at procedure or test/unavailable (HD). Will follow-up for PT treatment as schedule permits.  Mabeline Caras, PT, DPT Acute Rehabilitation Services  Pager [phone] Office [place] 06/19/2020, 7:29 AM

## 2020-06-19 NOTE — TOC Transition Note (Addendum)
Transition of Care [facility]) - CM/SW Discharge Note   Patient Details  Name: [name] [name] MRN: [id number] Date of Birth: 07/08/1955  Transition of Care [facility]) CM/SW Contact:  Zenon Mayo, RN Phone Number: 06/19/2020, 1:37 PM   Clinical Narrative:    Tommi Rumps with [facility]  notified of patient's dc today. NCM spoke with daughter in the room, she states to have the rolling walker delivered to the home.  NCM contacted Shelia with [employer], informed her that they want the rolling  Walker delivered to her home.   Final next level of care: [place] Barriers to Discharge: No Barriers Identified   Patient Goals and CMS Choice Patient states their goals for this hospitalization and ongoing recovery are:: get better CMS Medicare.gov Compare Post Acute Care list provided to:: Patient Choice offered to / list presented to : Patient  Discharge Placement                       Discharge Plan and Services   Discharge Planning Services: CM Consult Post Acute Care Choice: Home Health          DME Arranged: N/A DME Agency: NA       HH Arranged: RN,PT [place] Agency: [place] Date [facility] Agency Contacted: 06/13/20 Time [place]: [id number] Representative spoke with at [place]: [place] Interventions     Readmission Risk Interventions No flowsheet data found.

## 2020-06-19 NOTE — Discharge Summary (Addendum)
[name] [name]:[id number] DOB: 1955/08/11 DOA: 06/11/2020  PCP: Midge Minium, MD  Admit date: 06/11/2020  Discharge date: 06/19/2020  Admitted From: Home   Disposition:  Home   Recommendations for Outpatient Follow-up:   Follow up with PCP in 1-2 weeks  PCP Please obtain BMP/CBC, 2 view CXR in 1week,  (see Discharge instructions)   PCP Please follow up on the following pending results:     Home Health: Pt,RN   Equipment/Devices: None  Consultations: Renal, Neuro Discharge Condition: Stable    CODE STATUS: Full   Diet Recommendation: Renal diet with 1.5 L/day total fluid restriction.    Chief Complaint  Patient presents with  . Weakness     Brief history of present illness from the day of admission and additional interim summary     Patient is a 65 y.o. female with history of ESRD, HTN, depression/anxiety-chronic hypoxic respiratory failure on 2.5 L of oxygen at home, NMDA encephalitis (2018)-presented with fever and weakness after dialysis.   See below for further details.   Significant events: 3/21>> admit for evaluation of fever/weakness post HD.   Significant studies: 3/21>> chest x-ray: No pneumonia 3/24>> EEG: No seizures-d mild diffuse encephalopathy 3/25 - MRI -  1. No acute intracranial abnormality. 2. Stable small area of encephalomalacia and gliosis in the right frontal lobe at the site of prior biopsy. 3. Scattered and confluent foci of T2 hyperintensity within the white matter of the cerebral hemispheres, nonspecific, but not significantly changed from prior MRI.   Microbiology data: 3/21>> blood culture: No growth 3/21>> influenza/Covid PCR: Negative  Antimicrobial therapy: Vancomycin: 3/21 x1 Cefepime: 3/21>> 3/24 Flagyl: 3/21 x 1  Procedures  : None  Consults: Renal                                                                 Hospital Course    SIRS/fever: No source of infection, cultures remain negative, got short course of empiric antibiotics which were stopped several days ago, remained stable.  No signs of active infection, sepsis ruled out.  Waxing/waning encephalopathy-myoclonic jerks: Per family-ongoing for the past several weeks-initially attributed to possible uremia-and Lyrica use.  Dialyzed frequently-Lyrica discontinued-  EEG unremarkable.  MRI with some nonspecific changes specifically scattered T2 hyperintensity in the white matter which is nonspecific but new finding, neurology following case was discussed with neurologist Dr. Cheral Marker on 06/17/2020, with supportive care mentation back to baseline Lyrica discontinued upon discharge, discharge home with outpatient PCP follow-up in 7 to 10 days.  ESRD on HD MWF: Nephrology following.  Case discussed with nephrology on 06/16/2020.  HD on 06/19/2020 prior to discharge.  Normocytic anemia: Due to ESRD-defer Aranesp/IV iron to nephrology.  No evidence of GI loss at this point.  Thrombocytopenia: Mild-likely due to infectious issues-stable for  close monitoring.  HTN: BP stable-continue Norvasc/hydralazine/Imdur.  HLD: Continue Lipitor  Depression/anxiety: Stable-continue Cymbalta/Seroquel/trazodone  Chronic hypoxic respiratory failure secondary to possible ILD: On 2.5 L of oxygen at all times.  Continue PCCM follow-up in the outpatient setting, no acute issues.  Chronic pain syndrome: Stable-Lyrica discontinued.  History of NMDA encephalitis-s/p brain biopsy-followed by [facility] along the surgical drain site in 2018 ([facility]) no acute issues.   Discharge diagnosis     Principal Problem:   Sepsis due to pneumonia [facility]) Active Problems:   Essential hypertension   ESRD on dialysis ([place])   Hyperlipidemia   Chronic respiratory  failure with hypoxia (HCC)   Chronic pain disorder   Depression with anxiety    Discharge instructions    Discharge Instructions    Call MD for:  persistant nausea and vomiting   Complete by: As directed    Call MD for:  temperature >100.4   Complete by: As directed    Discharge instructions   Complete by: As directed    Follow with Primary MD Midge Minium, MD in 7 days   Get CBC, CMP, 2 view Chest X ray -  checked next visit within 1 week by Primary MD   Activity: As tolerated with Full fall precautions use walker/cane & assistance as needed  Disposition Home   Diet: Renal diet with 1.5 L/day total fluid restriction.  Special Instructions: If you have smoked or chewed Tobacco  in the last 2 yrs please stop smoking, stop any regular Alcohol  and or any Recreational drug use.  On your next visit with your primary care physician please Get Medicines reviewed and adjusted.  Please request your Prim.MD to go over all Hospital Tests and Procedure/Radiological results at the follow up, please get all Hospital records sent to your Prim MD by signing hospital release before you go home.  If you experience worsening of your admission symptoms, develop shortness of breath, life threatening emergency, suicidal or homicidal thoughts you must seek medical attention immediately by calling 911 or calling your MD immediately  if symptoms less severe.  You Must read complete instructions/literature along with all the possible adverse reactions/side effects for all the Medicines you take and that have been prescribed to you. Take any new Medicines after you have completely understood and accpet all the possible adverse reactions/side effects.   Stop Lyrica-do not resume it in the future.   Increase activity slowly   Complete by: As directed    Increase activity slowly   Complete by: As directed       Discharge Medications   Allergies as of 06/19/2020      Reactions   Lisinopril  Shortness Of Breath, Swelling, Other (See Comments)   Throat irritation also. Patient takes losartan and tolerates fine.   Lyrica [pregabalin] Other (See Comments)   Causing severe myoclonic jerking of UE's in march 2022, dc'd   Daypro [oxaprozin] Hives, Dermatitis      Medication List    STOP taking these medications   loratadine 10 MG tablet Commonly known as: CLARITIN   oxyCODONE-acetaminophen 7.5-325 MG tablet Commonly known as: Percocet   pregabalin 100 MG capsule Commonly known as: LYRICA     TAKE these medications   acetaminophen 650 MG CR tablet Commonly known as: TYLENOL Take 650-1,300 mg by mouth every 8 (eight) hours as needed for pain.   albuterol 108 (90 Base) MCG/ACT inhaler Commonly known as: VENTOLIN HFA INHALE 2 PUFFS INTO THE LUNGS EVERY  4 (FOUR) HOURS AS NEEDED FOR WHEEZING OR SHORTNESS OF BREATH.   amLODipine 10 MG tablet Commonly known as: NORVASC TAKE 1 TABLET BY MOUTH EVERY DAY   atorvastatin 40 MG tablet Commonly known as: LIPITOR Take 1 tablet (40 mg total) by mouth daily. Please make yearly appt with Dr. Acie Fredrickson for April 2022 for future refills. Thank you 1st attempt   blood glucose meter kit and supplies Kit Dispense based on patient and insurance preference. Patient should test sugars twice daily as directed. Dx. E11.9   cetirizine 10 MG tablet Commonly known as: ZYRTEC TAKE 1 TABLET BY MOUTH EVERY DAY   DULoxetine 20 MG capsule Commonly known as: CYMBALTA TAKE 1 CAPSULE BY MOUTH EVERY DAY What changed: how much to take   ferric citrate 1 GM 210 MG(Fe) tablet Commonly known as: AURYXIA Take 210 mg by mouth See admin instructions. Take 210 mg by mouth three times a day with meals and 210 mg two times a day with snacks   hydrALAZINE 100 MG tablet Commonly known as: APRESOLINE Take 1 tablet (100 mg total) by mouth 2 (two) times daily.   isosorbide dinitrate 20 MG tablet Commonly known as: ISORDIL TAKE 1 TABLET BY MOUTH THREE TIMES A  DAY   MIRCERA IJ Mircera   multivitamin Tabs tablet Take 1 tablet by mouth every morning.   nitroGLYCERIN 0.4 MG SL tablet Commonly known as: NITROSTAT Place 0.4 mg under the tongue every 5 (five) minutes as needed for chest pain.   omeprazole 40 MG capsule Commonly known as: PRILOSEC TAKE 1 CAPSULE BY MOUTH EVERY DAY What changed: how much to take   OneTouch Ultra test strip Generic drug: glucose blood USE AS INSTRUCTED TO TEST SUGARS TWICE DAILY. DX. E11.9   QUEtiapine 200 MG tablet Commonly known as: SEROQUEL Take 200 mg by mouth 2 (two) times daily.   traZODone 50 MG tablet Commonly known as: DESYREL Take 50 mg by mouth at bedtime as needed for sleep.            Durable Medical Equipment  (From admission, onward)         Start     Ordered   06/18/20 [phone]  For home use only DME Walker rolling  Once       Comments: 5 wheel  Question Answer Comment  Walker: With 5 Inch Wheels   Patient needs a walker to treat with the following condition Weakness      06/18/20 0833           Follow-up Information    Midge Minium, MD. Schedule an appointment as soon as possible for a visit in 1 week(s).   Specialty: Family Medicine Contact information: [address] [place] [address] [phone]        Hemodialysis clinic Follow up.   Why: Follow at your usual schedule.              Major procedures and Radiology Reports - PLEASE review detailed and final reports thoroughly  -       MR BRAIN WO CONTRAST  Result Date: 06/15/2020 CLINICAL DATA:  Mental status change. EXAM: MRI HEAD WITHOUT CONTRAST TECHNIQUE: Multiplanar, multiecho pulse sequences of the brain and surrounding structures were obtained without intravenous contrast. COMPARISON:  MRI of the brain February 22, 2019. FINDINGS: Brain: No acute infarction, hemorrhage, hydrocephalus, extra-axial collection or mass lesion. Small area of encephalomalacia and gliosis in the right frontal  lobe at the site of prior  biopsy. There is small herniation of brain tissue in this location into the burr hole, unchanged. Scattered and confluent foci of T2 hyperintensity are seen within the white matter of the cerebral hemispheres, nonspecific, not significantly changed from prior MRI. Vascular: Normal flow voids. Skull and upper cervical spine: Diffuse decrease of the T1 signal within the calvarium may be related to red marrow reconversion in the setting of anemia. Burr hole in the right frontal region. Sinuses/Orbits: Negative. IMPRESSION: 1. No acute intracranial abnormality. 2. Stable small area of encephalomalacia and gliosis in the right frontal lobe at the site of prior biopsy. 3. Scattered and confluent foci of T2 hyperintensity within the white matter of the cerebral hemispheres, nonspecific, but not significantly changed from prior MRI. Electronically Signed   By: Pedro Earls M.D.   On: 06/15/2020 15:50   DG Chest Port 1 View  Result Date: 06/11/2020 CLINICAL DATA:  65 year old female with possible sepsis. Fever, dialysis. EXAM: PORTABLE CHEST 1 VIEW COMPARISON:  Portable chest 04/18/2020 and earlier. FINDINGS: Portable AP semi upright view at 0742 hours. Stable left chest dialysis catheter. Lower lung volumes. Stable cardiomegaly and mediastinal contours. Stable trace fluid or thickening along the right minor fissure. Stable pulmonary vascularity without overt edema. No pneumothorax, consolidation. Paucity of bowel gas in the upper abdomen. No acute osseous abnormality identified. IMPRESSION: Lower lung volumes, otherwise stable chest with cardiomegaly and vascular  EEG adult  Result Date: 06/14/2020 Raenette Rover, MD     06/14/2020  9:01 PM EEG Report Indication: possible seizure activity The duration of the study was 22 minutes.  Electrodes were placed according to the International 10/20 system.  Video was reviewed/available for clinical correlation as needed. This  study was conducted during the waking/drowsy versus encephalopathic state. There was  perhaps a very vague anterior-posteirior voltage and frequency gradient.  There were no other clear features of organization including a discernible posterior dominant rhythm or sleep architecture noted at any point.  The background was mainly delta/theta slowing, of moderate amplitude.  Clear reactivity was seen, with a state change from a period with faster alpha/theta and a vague gradient transitioning to another period with diffuse theta/delta slowing.   Hyperventilation: Deferred Photic stimulation: Unremarkable There are no clear focal, paroxysmal or epileptiform abnormalities  or interhemispheric asymmetries. Impression: This is an abnormal waking/drowsy study due to markedly diminished versus absent organization consistent with a mild diffuse encephalopathy.  There are no clear focal or epileptiform abnormalities.   VAS US DUPLEX DIALYSIS ACCESS (AVF, AVG)  Result Date: 06/13/2020 DIALYSIS ACCESS Reason for Exam: Check for fluid collection around the LUA AVF. Access Site: Left Upper Extremity. Access Type: Basilic vein transposition 09-01-2017. 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 Comparison Study: 03-01-2019 Prior duplex dialysis access was patent with low                   flow in the outflow vein proximally. Aneurysmal outflow vein                   in the mid to distal upper arm. Performing Technologist: Darlin Coco RDMS,RVT Supporting Technologist: Vonzell Schlatter  Examination Guidelines: A complete evaluation includes B-mode imaging, spectral Doppler, color Doppler, and power Doppler as needed of all accessible portions of each vessel. Unilateral testing is considered an integral part of a complete examination. Limited examinations for reoccurring indications may be  performed as noted.  Findings: +--------------------+----------+-----------------+--------+ AVF                  PSV (cm/s)Flow Vol (mL/min)Comments +--------------------+----------+-----------------+--------+ Native artery inflow   130                              +--------------------+----------+-----------------+--------+ AVF Anastomosis        452           914                +--------------------+----------+-----------------+--------+  +------------+----------+-------------+----------+--------+ OUTFLOW VEINPSV (cm/s)Diameter (cm)Depth (cm)Describe +------------+----------+-------------+----------+--------+ Prox UA        418        0.54               tortuous +------------+----------+-------------+----------+--------+ Mid UA         434        0.65               tortuous +------------+----------+-------------+----------+--------+ Dist UA        421                                    +------------+----------+-------------+----------+--------+  Diagnosing physician: Curt Jews MD Electronically signed by Curt Jews MD on 06/13/2020 at 2:11:39 PM.   --------------------------------------------------------------------------------   Final     Micro Results     Recent Results (from the past 240 hour(s))  Resp Panel by RT-PCR (Flu A&B, Covid) Nasopharyngeal Swab     Status: None   Collection Time: 06/11/20  7:12 AM   Specimen: Nasopharyngeal Swab; Nasopharyngeal(NP) swabs in vial transport medium  Result Value Ref Range Status   SARS Coronavirus 2 by RT PCR NEGATIVE NEGATIVE Final    Comment: (NOTE) SARS-CoV-2 target nucleic acids are NOT DETECTED.  The SARS-CoV-2 RNA is generally detectable in upper respiratory specimens during the acute phase of infection. The lowest concentration of SARS-CoV-2 viral copies this assay can detect is 138 copies/mL. A negative result does not preclude SARS-Cov-2 infection and should not be used as the sole basis for treatment or other patient management decisions. A negative result may occur with  improper specimen collection/handling,  submission of specimen other than nasopharyngeal swab, presence of viral mutation(s) within the areas targeted by this assay, and inadequate number of viral copies(<138 copies/mL). A negative result must be combined with clinical observations, patient history, and epidemiological information. The expected result is Negative.  Fact Sheet for Patients:  EntrepreneurPulse.com.au  Fact Sheet for Healthcare Providers:  IncredibleEmployment.be  This test is no t yet approved or cleared by the [place] FDA and  has been authorized for detection and/or diagnosis of SARS-CoV-2 by FDA under an Emergency Use Authorization (EUA). This EUA will remain  in effect (meaning this test can be used) for the duration of the COVID-19 declaration under Section 564(b)(1) of the Act, 21 U.S.C.section 360bbb-3(b)(1), unless the authorization is terminated  or revoked sooner.       Influenza A by PCR NEGATIVE NEGATIVE Final   Influenza B by PCR NEGATIVE NEGATIVE Final    Comment: (NOTE) The Xpert Xpress SARS-CoV-2/FLU/RSV plus assay is intended as an aid in the diagnosis of influenza from Nasopharyngeal swab specimens and should not be used as a sole basis for treatment. Nasal washings and aspirates are unacceptable for Xpert Xpress SARS-CoV-2/FLU/RSV testing.  Fact Sheet for Patients:  EntrepreneurPulse.com.au  Fact Sheet for Healthcare Providers: IncredibleEmployment.be  This test is not yet approved or cleared by the [place] FDA and has been authorized for detection and/or diagnosis of SARS-CoV-2 by FDA under an Emergency Use Authorization (EUA). This EUA will remain in effect (meaning this test can be used) for the duration of the COVID-19 declaration under Section 564(b)(1) of the Act, 21 U.S.C. section 360bbb-3(b)(1), unless the authorization is terminated or revoked.  Performed at [facility], [place] [address] [place], [place] [address]   Culture, blood (routine x 2)     Status: None   Collection Time: 06/11/20  7:16 AM   Specimen: BLOOD RIGHT FOREARM  Result Value Ref Range Status   Specimen Description BLOOD RIGHT FOREARM  Final   Special Requests   Final    BOTTLES DRAWN AEROBIC AND ANAEROBIC Blood Culture results may not be optimal due to an inadequate volume of blood received in culture bottles   Culture   Final    NO GROWTH 5 DAYS Performed at [facility], [place] [address], [place], [place] [address]    Report Status 06/16/2020 FINAL  Final  Culture, blood (routine x 2)     Status: None   Collection Time: 06/11/20  7:30 AM   Specimen: BLOOD LEFT HAND  Result Value Ref Range Status   Specimen Description BLOOD LEFT HAND  Final   Special Requests   Final    AEROBIC BOTTLE ONLY Blood Culture results may not be optimal due to an inadequate volume of blood received in culture bottles   Culture   Final    NO GROWTH 5 DAYS Performed at [facility], [place] [address], [place], [place] [address]    Report Status 06/16/2020 FINAL  Final  MRSA PCR Screening     Status: None   Collection Time: 06/12/20  6:28 AM   Specimen: Nasal Mucosa; Nasopharyngeal  Result Value Ref Range Status   MRSA by PCR NEGATIVE NEGATIVE Final    Comment:        The GeneXpert MRSA Assay (FDA approved for NASAL specimens only), is one component of a comprehensive MRSA colonization surveillance program. It is not intended to diagnose MRSA infection nor to guide or monitor treatment for MRSA infections. Performed at [facility], [place] [address] [place], [place] [address]     Today   Subjective    Lorece Keach today has no headache,no chest abdominal pain,no new weakness tingling or numbness, feels much better wants to go home today.     Objective   Blood pressure (!) 155/92, pulse 73, temperature 98.1 F (36.7 C), temperature source Oral, resp. rate 18, height '5\' 6"'  (1.676  m), weight 92.5 kg, SpO2 100 %.   Intake/Output Summary (Last 24 hours) at 06/19/2020 0934 Last data filed at 06/18/2020 1819 Gross per 24 hour  Intake 720 ml  Output --  Net 720 ml    Exam  Awake Alert, No new F.N deficits, Normal affect .AT,PERRAL Supple Neck,No JVD, No cervical lymphadenopathy appriciated.  Symmetrical Chest wall movement, Good air movement bilaterally, CTAB RRR,No Gallops,Rubs or new Murmurs, No Parasternal Heave +ve B.Sounds, Abd Soft, Non tender, No organomegaly appriciated, No rebound -guarding or rigidity. No Cyanosis, Clubbing or edema, No new Rash or bruise   Data Review   CBC w Diff:  Lab Results  Component Value Date   WBC 7.7 06/19/2020   HGB 8.2 (L) 06/19/2020   HCT  25.6 (L) 06/19/2020   PLT 117 (L) 06/19/2020   LYMPHOPCT 8 06/11/2020   MONOPCT 5 06/11/2020   EOSPCT 1 06/11/2020   BASOPCT 0 06/11/2020    CMP:  Lab Results  Component Value Date   NA 132 (L) 06/19/2020   NA 139 07/05/2019   K 3.9 06/19/2020   CL 95 (L) 06/19/2020   CO2 22 06/19/2020   BUN 50 (H) 06/19/2020   BUN 22 07/05/2019   CREATININE 10.85 (H) 06/19/2020   CREATININE 4.46 (H) 03/26/2017   PROT 7.4 06/11/2020   PROT 7.2 07/05/2019   ALBUMIN 2.9 (L) 06/19/2020   ALBUMIN 4.4 07/05/2019   BILITOT 0.6 06/11/2020   BILITOT 0.3 07/05/2019   ALKPHOS 103 06/11/2020   AST 18 06/11/2020   ALT 15 06/11/2020  .   Total Time in preparing paper work, data evaluation and todays exam - 69 minutes  Lala Lund M.D on 06/19/2020 at 9:34 AM  [facility]

## 2020-06-20 ENCOUNTER — Telehealth: Payer: Self-pay

## 2020-06-20 DIAGNOSIS — J189 Pneumonia, unspecified organism: Secondary | ICD-10-CM | POA: Diagnosis not present

## 2020-06-20 DIAGNOSIS — R0602 Shortness of breath: Secondary | ICD-10-CM | POA: Diagnosis not present

## 2020-06-20 DIAGNOSIS — N2581 Secondary hyperparathyroidism of renal origin: Secondary | ICD-10-CM | POA: Diagnosis not present

## 2020-06-20 DIAGNOSIS — D689 Coagulation defect, unspecified: Secondary | ICD-10-CM | POA: Diagnosis not present

## 2020-06-20 DIAGNOSIS — T8249XD Other complication of vascular dialysis catheter, subsequent encounter: Secondary | ICD-10-CM | POA: Diagnosis not present

## 2020-06-20 DIAGNOSIS — Z992 Dependence on renal dialysis: Secondary | ICD-10-CM | POA: Diagnosis not present

## 2020-06-20 DIAGNOSIS — N186 End stage renal disease: Secondary | ICD-10-CM | POA: Diagnosis not present

## 2020-06-20 NOTE — Telephone Encounter (Signed)
Transition Care Management Unsuccessful Follow-up Telephone Call  Date of discharge and from where:  06/19/2020-[facility]  Attempts:  1st Attempt  Reason for unsuccessful TCM follow-up call:  Unable to leave message

## 2020-06-21 ENCOUNTER — Telehealth: Payer: Self-pay

## 2020-06-21 DIAGNOSIS — R69 Illness, unspecified: Secondary | ICD-10-CM | POA: Diagnosis not present

## 2020-06-21 DIAGNOSIS — D696 Thrombocytopenia, unspecified: Secondary | ICD-10-CM | POA: Diagnosis not present

## 2020-06-21 DIAGNOSIS — A419 Sepsis, unspecified organism: Secondary | ICD-10-CM | POA: Diagnosis not present

## 2020-06-21 DIAGNOSIS — I150 Renovascular hypertension: Secondary | ICD-10-CM | POA: Diagnosis not present

## 2020-06-21 DIAGNOSIS — G253 Myoclonus: Secondary | ICD-10-CM | POA: Diagnosis not present

## 2020-06-21 DIAGNOSIS — G9389 Other specified disorders of brain: Secondary | ICD-10-CM | POA: Diagnosis not present

## 2020-06-21 DIAGNOSIS — I1311 Hypertensive heart and chronic kidney disease without heart failure, with stage 5 chronic kidney disease, or end stage renal disease: Secondary | ICD-10-CM | POA: Diagnosis not present

## 2020-06-21 DIAGNOSIS — Z992 Dependence on renal dialysis: Secondary | ICD-10-CM | POA: Diagnosis not present

## 2020-06-21 DIAGNOSIS — J9611 Chronic respiratory failure with hypoxia: Secondary | ICD-10-CM | POA: Diagnosis not present

## 2020-06-21 DIAGNOSIS — J189 Pneumonia, unspecified organism: Secondary | ICD-10-CM | POA: Diagnosis not present

## 2020-06-21 DIAGNOSIS — D631 Anemia in chronic kidney disease: Secondary | ICD-10-CM | POA: Diagnosis not present

## 2020-06-21 DIAGNOSIS — N186 End stage renal disease: Secondary | ICD-10-CM | POA: Diagnosis not present

## 2020-06-21 NOTE — Telephone Encounter (Signed)
TCM call completed-Patient is asking for an order for a shower chair. Send order to [facility].

## 2020-06-21 NOTE — Telephone Encounter (Signed)
Transition Care Management Follow-up Telephone Call  Date of discharge and from where: 06/19/20-[facility]  How have you been since you were released from the hospital? Good  Any questions or concerns? No  Items Reviewed:  Did the pt receive and understand the discharge instructions provided? Yes   Medications obtained and verified? Yes   Other? Yes   Any new allergies since your discharge? No   Dietary orders reviewed? Yes  Do you have support at home? Yes   Home Care and Equipment/Supplies: Were home health services ordered? yes If so, what is the name of the agency? [facility]  Has the agency set up a time to come to the patient's home? yes Were any new equipment or medical supplies ordered?  Yes: Rolling walker What is the name of the medical supply agency? [facility] Were you able to get the supplies/equipment? no Do you have any questions related to the use of the equipment or supplies? No  Functional Questionnaire: (I = Independent and D = Dependent) ADLs: I  Bathing/Dressing- I  Meal Prep- I  Eating- I  Maintaining continence- I  Transferring/Ambulation- I  Managing Meds- I  Follow up appointments reviewed:   PCP Hospital f/u appt confirmed? Yes  Scheduled to see Dr. Birdie Riddle on 06/28/20 @ 11:00.  [facility] f/u appt confirmed? n/a   Are transportation arrangements needed? No   If their condition worsens, is the pt aware to call PCP or go to the Emergency Dept.? Yes  Was the patient provided with contact information for the PCP's office or ED? Yes  Was to pt encouraged to call back with questions or concerns? Yes

## 2020-06-21 NOTE — Telephone Encounter (Signed)
Transition Care Management Unsuccessful Follow-up Telephone Call  Date of discharge and from where: 06/19/2020-[place]  Attempts:  3rd Attempt  Reason for unsuccessful TCM follow-up call:  Voice mail full

## 2020-06-22 DIAGNOSIS — R0602 Shortness of breath: Secondary | ICD-10-CM | POA: Diagnosis not present

## 2020-06-22 DIAGNOSIS — Z992 Dependence on renal dialysis: Secondary | ICD-10-CM | POA: Diagnosis not present

## 2020-06-22 DIAGNOSIS — D689 Coagulation defect, unspecified: Secondary | ICD-10-CM | POA: Diagnosis not present

## 2020-06-22 DIAGNOSIS — T8249XD Other complication of vascular dialysis catheter, subsequent encounter: Secondary | ICD-10-CM | POA: Diagnosis not present

## 2020-06-22 DIAGNOSIS — N186 End stage renal disease: Secondary | ICD-10-CM | POA: Diagnosis not present

## 2020-06-22 DIAGNOSIS — N2581 Secondary hyperparathyroidism of renal origin: Secondary | ICD-10-CM | POA: Diagnosis not present

## 2020-06-25 DIAGNOSIS — Z992 Dependence on renal dialysis: Secondary | ICD-10-CM | POA: Diagnosis not present

## 2020-06-25 DIAGNOSIS — N186 End stage renal disease: Secondary | ICD-10-CM | POA: Diagnosis not present

## 2020-06-25 DIAGNOSIS — N2581 Secondary hyperparathyroidism of renal origin: Secondary | ICD-10-CM | POA: Diagnosis not present

## 2020-06-25 DIAGNOSIS — D689 Coagulation defect, unspecified: Secondary | ICD-10-CM | POA: Diagnosis not present

## 2020-06-25 DIAGNOSIS — T8249XD Other complication of vascular dialysis catheter, subsequent encounter: Secondary | ICD-10-CM | POA: Diagnosis not present

## 2020-06-27 DIAGNOSIS — N186 End stage renal disease: Secondary | ICD-10-CM | POA: Diagnosis not present

## 2020-06-27 DIAGNOSIS — T8249XD Other complication of vascular dialysis catheter, subsequent encounter: Secondary | ICD-10-CM | POA: Diagnosis not present

## 2020-06-27 DIAGNOSIS — D689 Coagulation defect, unspecified: Secondary | ICD-10-CM | POA: Diagnosis not present

## 2020-06-27 DIAGNOSIS — Z992 Dependence on renal dialysis: Secondary | ICD-10-CM | POA: Diagnosis not present

## 2020-06-27 DIAGNOSIS — N2581 Secondary hyperparathyroidism of renal origin: Secondary | ICD-10-CM | POA: Diagnosis not present

## 2020-06-28 ENCOUNTER — Telehealth: Payer: Self-pay

## 2020-06-28 ENCOUNTER — Inpatient Hospital Stay: Payer: Medicare HMO | Admitting: Family Medicine

## 2020-06-28 ENCOUNTER — Telehealth: Payer: Self-pay | Admitting: Family Medicine

## 2020-06-28 DIAGNOSIS — R69 Illness, unspecified: Secondary | ICD-10-CM | POA: Diagnosis not present

## 2020-06-28 DIAGNOSIS — J189 Pneumonia, unspecified organism: Secondary | ICD-10-CM | POA: Diagnosis not present

## 2020-06-28 DIAGNOSIS — D696 Thrombocytopenia, unspecified: Secondary | ICD-10-CM | POA: Diagnosis not present

## 2020-06-28 DIAGNOSIS — G9389 Other specified disorders of brain: Secondary | ICD-10-CM | POA: Diagnosis not present

## 2020-06-28 DIAGNOSIS — J9611 Chronic respiratory failure with hypoxia: Secondary | ICD-10-CM | POA: Diagnosis not present

## 2020-06-28 DIAGNOSIS — A419 Sepsis, unspecified organism: Secondary | ICD-10-CM | POA: Diagnosis not present

## 2020-06-28 DIAGNOSIS — I1311 Hypertensive heart and chronic kidney disease without heart failure, with stage 5 chronic kidney disease, or end stage renal disease: Secondary | ICD-10-CM | POA: Diagnosis not present

## 2020-06-28 DIAGNOSIS — D631 Anemia in chronic kidney disease: Secondary | ICD-10-CM | POA: Diagnosis not present

## 2020-06-28 DIAGNOSIS — G253 Myoclonus: Secondary | ICD-10-CM | POA: Diagnosis not present

## 2020-06-28 DIAGNOSIS — N186 End stage renal disease: Secondary | ICD-10-CM | POA: Diagnosis not present

## 2020-06-28 NOTE — Telephone Encounter (Signed)
Home health just 1 week x 2 weeks.

## 2020-06-28 NOTE — Telephone Encounter (Signed)
Pt was in hospital from 3/21-3/29/22. Contacted pt to follow up on attempt to reschedule [facility] removal. Pt reported her dialysis center has scheduled removal for 07/03/20 at [facility].

## 2020-06-29 DIAGNOSIS — D689 Coagulation defect, unspecified: Secondary | ICD-10-CM | POA: Diagnosis not present

## 2020-06-29 DIAGNOSIS — Z992 Dependence on renal dialysis: Secondary | ICD-10-CM | POA: Diagnosis not present

## 2020-06-29 DIAGNOSIS — N2581 Secondary hyperparathyroidism of renal origin: Secondary | ICD-10-CM | POA: Diagnosis not present

## 2020-06-29 DIAGNOSIS — T8249XD Other complication of vascular dialysis catheter, subsequent encounter: Secondary | ICD-10-CM | POA: Diagnosis not present

## 2020-06-29 DIAGNOSIS — N186 End stage renal disease: Secondary | ICD-10-CM | POA: Diagnosis not present

## 2020-06-29 NOTE — Telephone Encounter (Signed)
Mal Amabile at [facility] and gave the verbal order for therapy for patient. Mary understood. No further concerns at this time.

## 2020-06-29 NOTE — Telephone Encounter (Signed)
Verbal orders for therapy for patient needed.

## 2020-06-29 NOTE — Telephone Encounter (Signed)
Ok to give verbal orders?

## 2020-07-02 DIAGNOSIS — L299 Pruritus, unspecified: Secondary | ICD-10-CM | POA: Diagnosis not present

## 2020-07-02 DIAGNOSIS — Z992 Dependence on renal dialysis: Secondary | ICD-10-CM | POA: Diagnosis not present

## 2020-07-02 DIAGNOSIS — N186 End stage renal disease: Secondary | ICD-10-CM | POA: Diagnosis not present

## 2020-07-02 DIAGNOSIS — N2581 Secondary hyperparathyroidism of renal origin: Secondary | ICD-10-CM | POA: Diagnosis not present

## 2020-07-02 DIAGNOSIS — D689 Coagulation defect, unspecified: Secondary | ICD-10-CM | POA: Diagnosis not present

## 2020-07-02 DIAGNOSIS — T8249XD Other complication of vascular dialysis catheter, subsequent encounter: Secondary | ICD-10-CM | POA: Diagnosis not present

## 2020-07-02 NOTE — Telephone Encounter (Signed)
Will address at [facility] visit

## 2020-07-03 ENCOUNTER — Inpatient Hospital Stay: Payer: Medicare HMO | Admitting: Family Medicine

## 2020-07-03 ENCOUNTER — Telehealth: Payer: Self-pay | Admitting: Family Medicine

## 2020-07-03 DIAGNOSIS — N186 End stage renal disease: Secondary | ICD-10-CM | POA: Diagnosis not present

## 2020-07-03 DIAGNOSIS — Z452 Encounter for adjustment and management of vascular access device: Secondary | ICD-10-CM | POA: Diagnosis not present

## 2020-07-03 DIAGNOSIS — Z992 Dependence on renal dialysis: Secondary | ICD-10-CM | POA: Diagnosis not present

## 2020-07-03 NOTE — Telephone Encounter (Signed)
Original faxed and copy sent to [facility]

## 2020-07-03 NOTE — Telephone Encounter (Signed)
Forms have been placed in bin to be filled.

## 2020-07-03 NOTE — Telephone Encounter (Signed)
..  Home Health Certification or Plan of Care Tracking  Is this a Certification or Plan of [place]  Order Number:  [id number]  Has charge sheet been attached? Yes Where has form been placed:  Dr. Virgil Benedict bin up front

## 2020-07-03 NOTE — Telephone Encounter (Signed)
Form completed and placed in basket  

## 2020-07-04 DIAGNOSIS — N186 End stage renal disease: Secondary | ICD-10-CM | POA: Diagnosis not present

## 2020-07-04 DIAGNOSIS — T8249XD Other complication of vascular dialysis catheter, subsequent encounter: Secondary | ICD-10-CM | POA: Diagnosis not present

## 2020-07-04 DIAGNOSIS — N2581 Secondary hyperparathyroidism of renal origin: Secondary | ICD-10-CM | POA: Diagnosis not present

## 2020-07-04 DIAGNOSIS — D689 Coagulation defect, unspecified: Secondary | ICD-10-CM | POA: Diagnosis not present

## 2020-07-04 DIAGNOSIS — L299 Pruritus, unspecified: Secondary | ICD-10-CM | POA: Diagnosis not present

## 2020-07-04 DIAGNOSIS — Z992 Dependence on renal dialysis: Secondary | ICD-10-CM | POA: Diagnosis not present

## 2020-07-06 DIAGNOSIS — T8249XD Other complication of vascular dialysis catheter, subsequent encounter: Secondary | ICD-10-CM | POA: Diagnosis not present

## 2020-07-06 DIAGNOSIS — N2581 Secondary hyperparathyroidism of renal origin: Secondary | ICD-10-CM | POA: Diagnosis not present

## 2020-07-06 DIAGNOSIS — L299 Pruritus, unspecified: Secondary | ICD-10-CM | POA: Diagnosis not present

## 2020-07-06 DIAGNOSIS — D689 Coagulation defect, unspecified: Secondary | ICD-10-CM | POA: Diagnosis not present

## 2020-07-06 DIAGNOSIS — Z992 Dependence on renal dialysis: Secondary | ICD-10-CM | POA: Diagnosis not present

## 2020-07-06 DIAGNOSIS — N186 End stage renal disease: Secondary | ICD-10-CM | POA: Diagnosis not present

## 2020-07-08 ENCOUNTER — Other Ambulatory Visit: Payer: Self-pay | Admitting: Family Medicine

## 2020-07-08 DIAGNOSIS — R0602 Shortness of breath: Secondary | ICD-10-CM | POA: Diagnosis not present

## 2020-07-09 DIAGNOSIS — D689 Coagulation defect, unspecified: Secondary | ICD-10-CM | POA: Diagnosis not present

## 2020-07-09 DIAGNOSIS — Z992 Dependence on renal dialysis: Secondary | ICD-10-CM | POA: Diagnosis not present

## 2020-07-09 DIAGNOSIS — L299 Pruritus, unspecified: Secondary | ICD-10-CM | POA: Diagnosis not present

## 2020-07-09 DIAGNOSIS — N2581 Secondary hyperparathyroidism of renal origin: Secondary | ICD-10-CM | POA: Diagnosis not present

## 2020-07-09 DIAGNOSIS — E1129 Type 2 diabetes mellitus with other diabetic kidney complication: Secondary | ICD-10-CM | POA: Diagnosis not present

## 2020-07-09 DIAGNOSIS — N186 End stage renal disease: Secondary | ICD-10-CM | POA: Diagnosis not present

## 2020-07-10 DIAGNOSIS — I1311 Hypertensive heart and chronic kidney disease without heart failure, with stage 5 chronic kidney disease, or end stage renal disease: Secondary | ICD-10-CM | POA: Diagnosis not present

## 2020-07-10 DIAGNOSIS — A419 Sepsis, unspecified organism: Secondary | ICD-10-CM | POA: Diagnosis not present

## 2020-07-10 DIAGNOSIS — N186 End stage renal disease: Secondary | ICD-10-CM | POA: Diagnosis not present

## 2020-07-10 DIAGNOSIS — J9611 Chronic respiratory failure with hypoxia: Secondary | ICD-10-CM | POA: Diagnosis not present

## 2020-07-10 DIAGNOSIS — D631 Anemia in chronic kidney disease: Secondary | ICD-10-CM | POA: Diagnosis not present

## 2020-07-10 DIAGNOSIS — D696 Thrombocytopenia, unspecified: Secondary | ICD-10-CM | POA: Diagnosis not present

## 2020-07-10 DIAGNOSIS — J189 Pneumonia, unspecified organism: Secondary | ICD-10-CM | POA: Diagnosis not present

## 2020-07-10 DIAGNOSIS — G253 Myoclonus: Secondary | ICD-10-CM | POA: Diagnosis not present

## 2020-07-10 DIAGNOSIS — R69 Illness, unspecified: Secondary | ICD-10-CM | POA: Diagnosis not present

## 2020-07-10 DIAGNOSIS — G9389 Other specified disorders of brain: Secondary | ICD-10-CM | POA: Diagnosis not present

## 2020-07-11 ENCOUNTER — Telehealth: Payer: Self-pay | Admitting: Family Medicine

## 2020-07-11 DIAGNOSIS — N186 End stage renal disease: Secondary | ICD-10-CM | POA: Diagnosis not present

## 2020-07-11 DIAGNOSIS — L299 Pruritus, unspecified: Secondary | ICD-10-CM | POA: Diagnosis not present

## 2020-07-11 DIAGNOSIS — E1129 Type 2 diabetes mellitus with other diabetic kidney complication: Secondary | ICD-10-CM | POA: Diagnosis not present

## 2020-07-11 DIAGNOSIS — N2581 Secondary hyperparathyroidism of renal origin: Secondary | ICD-10-CM | POA: Diagnosis not present

## 2020-07-11 DIAGNOSIS — Z992 Dependence on renal dialysis: Secondary | ICD-10-CM | POA: Diagnosis not present

## 2020-07-11 DIAGNOSIS — D689 Coagulation defect, unspecified: Secondary | ICD-10-CM | POA: Diagnosis not present

## 2020-07-11 MED ORDER — HYDROCODONE-ACETAMINOPHEN 5-325 MG PO TABS: 1.0000 | ORAL_TABLET | Freq: Four times a day (QID) | ORAL | 0 refills | Status: AC | PRN

## 2020-07-11 NOTE — Telephone Encounter (Signed)
Called patient to let her know that the medication was sent to the pharmacy .

## 2020-07-11 NOTE — Telephone Encounter (Signed)
I called patient to let her know what your plan was , and she asked could you send in some so she can make it until the appointment because she has not slept in two days due to pain.

## 2020-07-11 NOTE — Telephone Encounter (Signed)
Patient went to the hospital in march and they took her off of Pregabalin and states that's the only medication to help with pain for her foot and wanted to know if she could get something else sent to her pharmacy.

## 2020-07-11 NOTE — Telephone Encounter (Signed)
Will send hydrocodone to pharmacy for pain but this is not a long term solution.

## 2020-07-11 NOTE — Telephone Encounter (Signed)
She has an appointment upcoming next week and we need to review her recent hospitalizations, med changes, etc before we can send something to the pharmacy.

## 2020-07-11 NOTE — Telephone Encounter (Signed)
Pt called in stating that the hosp. Took her off of the Pregabalin, she states that this is the only thing that helps her foot pain. She wanted to know if Dr. Birdie Riddle could send in something for the pain. She is unable to sleep. Pt can be reached at the home # and [facility] uses [facility] [address].   Please advise   She has an appt with Dr. Birdie Riddle next week on 04/26

## 2020-07-13 DIAGNOSIS — N186 End stage renal disease: Secondary | ICD-10-CM | POA: Diagnosis not present

## 2020-07-13 DIAGNOSIS — E1129 Type 2 diabetes mellitus with other diabetic kidney complication: Secondary | ICD-10-CM | POA: Diagnosis not present

## 2020-07-13 DIAGNOSIS — L299 Pruritus, unspecified: Secondary | ICD-10-CM | POA: Diagnosis not present

## 2020-07-13 DIAGNOSIS — N2581 Secondary hyperparathyroidism of renal origin: Secondary | ICD-10-CM | POA: Diagnosis not present

## 2020-07-13 DIAGNOSIS — D689 Coagulation defect, unspecified: Secondary | ICD-10-CM | POA: Diagnosis not present

## 2020-07-13 DIAGNOSIS — Z992 Dependence on renal dialysis: Secondary | ICD-10-CM | POA: Diagnosis not present

## 2020-07-14 ENCOUNTER — Emergency Department (HOSPITAL_COMMUNITY)
Admission: EM | Admit: 2020-07-14 | Discharge: 2020-07-14 | Disposition: A | Discharge status: Home / Self Care | Payer: Medicare HMO | Attending: Emergency Medicine | Admitting: Emergency Medicine

## 2020-07-14 ENCOUNTER — Other Ambulatory Visit: Payer: Self-pay

## 2020-07-14 ENCOUNTER — Emergency Department (HOSPITAL_COMMUNITY): Payer: Medicare HMO

## 2020-07-14 ENCOUNTER — Encounter (HOSPITAL_COMMUNITY): Payer: Self-pay

## 2020-07-14 DIAGNOSIS — Z743 Need for continuous supervision: Secondary | ICD-10-CM | POA: Diagnosis not present

## 2020-07-14 DIAGNOSIS — I120 Hypertensive chronic kidney disease with stage 5 chronic kidney disease or end stage renal disease: Secondary | ICD-10-CM | POA: Insufficient documentation

## 2020-07-14 DIAGNOSIS — E1122 Type 2 diabetes mellitus with diabetic chronic kidney disease: Secondary | ICD-10-CM | POA: Insufficient documentation

## 2020-07-14 DIAGNOSIS — M810 Age-related osteoporosis without current pathological fracture: Secondary | ICD-10-CM | POA: Diagnosis not present

## 2020-07-14 DIAGNOSIS — N186 End stage renal disease: Secondary | ICD-10-CM | POA: Insufficient documentation

## 2020-07-14 DIAGNOSIS — M19072 Primary osteoarthritis, left ankle and foot: Secondary | ICD-10-CM | POA: Diagnosis not present

## 2020-07-14 DIAGNOSIS — I10 Essential (primary) hypertension: Secondary | ICD-10-CM | POA: Diagnosis not present

## 2020-07-14 DIAGNOSIS — J45909 Unspecified asthma, uncomplicated: Secondary | ICD-10-CM | POA: Diagnosis not present

## 2020-07-14 DIAGNOSIS — Z79899 Other long term (current) drug therapy: Secondary | ICD-10-CM | POA: Diagnosis not present

## 2020-07-14 DIAGNOSIS — M79675 Pain in left toe(s): Secondary | ICD-10-CM | POA: Insufficient documentation

## 2020-07-14 DIAGNOSIS — Z992 Dependence on renal dialysis: Secondary | ICD-10-CM | POA: Insufficient documentation

## 2020-07-14 DIAGNOSIS — R0902 Hypoxemia: Secondary | ICD-10-CM | POA: Diagnosis not present

## 2020-07-14 DIAGNOSIS — Z87891 Personal history of nicotine dependence: Secondary | ICD-10-CM | POA: Insufficient documentation

## 2020-07-14 DIAGNOSIS — I2510 Atherosclerotic heart disease of native coronary artery without angina pectoris: Secondary | ICD-10-CM | POA: Diagnosis not present

## 2020-07-14 MED ORDER — PREDNISONE 20 MG PO TABS
20.0000 mg | ORAL_TABLET | Freq: Once | ORAL | Status: AC
  Administered 2020-07-14: 20 mg via ORAL
  Filled 2020-07-14: qty 1

## 2020-07-14 MED ORDER — PREDNISONE 10 MG PO TABS: 20.0000 mg | ORAL_TABLET | Freq: Two times a day (BID) | ORAL | 0 refills | Status: DC

## 2020-07-14 NOTE — Discharge Instructions (Addendum)
Begin taking prednisone as prescribed.  Rest.  Follow-up with primary doctor if not improving in the next few days, and return to the ER if symptoms significantly worsen or change.

## 2020-07-14 NOTE — ED Provider Notes (Signed)
[facility] EMERGENCY DEPARTMENT Provider Note   CSN: [id number] Arrival date & time: 07/14/20  0121     History Chief Complaint  Patient presents with  . Toe Pain    [name] [name] is a 65 y.o. female.  Patient is a 65 year old female with past medical history of borderline diabetes, end-stage renal disease on hemodialysis, GERD, hypertension, hyperlipidemia.  Patient presents today for evaluation of left great toe pain for the past 2 days.  This began in the absence of any injury or trauma.  She was prescribed hydrocodone by her primary doctor, but this did not help.  Patient has no history of gout, but is concerned she may have gout.  The history is provided by the patient.  Toe Pain This is a new problem. The current episode started 2 days ago. The problem occurs constantly. The problem has not changed since onset.The symptoms are aggravated by walking. Nothing relieves the symptoms.       Past Medical History:  Diagnosis Date  . Adenomatous colon polyp 04/1998  . Anemia   . Anxiety   . Arthritis    HIp  . Carotid artery disease ([place])    Carotid US 1/18: bilateral ICA 1-39, R thyroid lobe nodule (1.9x2.2x3cm); numerous L thyroid lobe nodules - repeat 1 year  . Depression   . Diabetes mellitus    diet controlled  . EBV infection    Per [facility]  . Encephalitis    NMDA per [facility]  . End-stage renal disease (ESRD) ([facility])    hx of renal transplant failed. [facility].   . Esophagitis    Grade 1 Distal  . GERD (gastroesophageal reflux disease)   . Hemorrhoids   . History of blood transfusion   . History of kidney stones    Passed  . History of subdural hematoma   . Hyperkalemia   . Hyperlipidemia   . Hypertension   . Hypomagnesemia   . Left jugular vein thrombosis    Partial resolved per [facility]  . Metabolic acidosis   . On home oxygen therapy    2.5 liters  . Pneumonia   . Right jugular vein thrombosis    resolved from [facility]  . Seizures [facility])      Patient Active Problem List   Diagnosis Date Noted  . Sepsis due to pneumonia ([place]) 06/11/2020  . Chronic pain disorder 06/11/2020  . Depression with anxiety 06/11/2020  . Shortness of breath 01/05/2020  . Cough 01/05/2020  . Abnormal findings on diagnostic imaging of lung 01/05/2020  . Chronic respiratory failure with hypoxia ([place]) 01/05/2020  . Peripheral neuropathy 09/22/2019  . Tumor 07/28/2019  . History of seizures 07/28/2019  . Tremor 07/28/2019  . Stress incontinence 12/24/2017  . Cough variant asthma 12/24/2017  . Tremor of left hand 12/24/2017  . History of internal jugular thrombosis 03/26/2017  . 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 Anemia 07/08/2016  . Abnormal brain MRI 07/03/2016  . Fasciculation 06/18/2016  . Carotid artery disease ([place])   . Encounter for screening for COVID-19 01/28/2016  . Hyperlipidemia 06/28/2015  . Diabetes mellitus type II, controlled ([place]) 06/28/2015  . Thoracic ascending aortic aneurysm (41 mm on Echo 06/2013) 07/06/2013  . ESRD on dialysis ([place]) 06/16/2013  . History of renal transplant 06/16/2013  . GERD (gastroesophageal reflux disease) 08/13/2009  . OTH&UNSPEC NONINFECTIOUS GASTROENTERITIS&COLITIS 08/13/2009  . Essential hypertension 04/25/2009  . Hemorrhoids 04/25/2009  . WEIGHT LOSS 04/25/2009  . NAUSEA AND VOMITING  04/25/2009  . PERSONAL HX COLONIC POLYPS 04/25/2009    Past Surgical History:  Procedure Laterality Date  . A/V FISTULAGRAM Left 06/04/2017   Procedure: A/V FISTULAGRAM;  Surgeon: Conrad Balmville, MD;  Location: [facility];  Service: Cardiovascular;  Laterality: Left;  . A/V FISTULAGRAM N/A 03/24/2019   Procedure: A/V FISTULAGRAM - left arm;  Surgeon: [name], MD;  Location: [place] [facility];  Service: Cardiovascular;  Laterality: N/A;  . A/V FISTULAGRAM N/A 03/08/2020   Procedure: A/V FISTULAGRAM - Left arm;  Surgeon: [name], MD;   Location: [place] [facility];  Service: Cardiovascular;  Laterality: N/A;  . ABDOMINAL HYSTERECTOMY    . AV FISTULA PLACEMENT  07/04/2005   [place] AV fistula  . AV FISTULA PLACEMENT  08/27/2005  . AV FISTULA PLACEMENT Left 04/29/2017   Procedure: Creation of Left Arm ARTERIOVENOUS BRACHIOCEPHALIC FISTULA;  Surgeon: Elam Dutch, MD;  Location: [facility];  Service: Vascular;  Laterality: Left;  . AV FISTULA PLACEMENT Left 06/16/2017   Procedure: BRACHIO_BASCILIC ARTERIOVENOUS (AV) FISTULA CREATION;  Surgeon: Waynetta Sandy, MD;  Location: [facility];  Service: Vascular;  Laterality: Left;  . AV FISTULA PLACEMENT W/ PTFE  08/27/2005  . BASCILIC VEIN TRANSPOSITION Left 06/16/2017   Procedure: BRACHIOCEPHALIC TRANSPOSITION  LEFT ARM;  Surgeon: Waynetta Sandy, MD;  Location: [facility];  Service: Vascular;  Laterality: Left;  . BASCILIC VEIN TRANSPOSITION Left 09/01/2017   Procedure: BASILIC VEIN TRANSPOSITION SECOND STAGE;  Surgeon: Waynetta Sandy, MD;  Location: [facility];  Service: Vascular;  Laterality: Left;  . BRAIN BIOPSY    . CESAREAN SECTION    . COLONOSCOPY W/ POLYPECTOMY    . DG AV DIALYSIS GRAFT DECLOT OR  07/24/2005   AV Gore-Tex graf  . DG AV DIALYSIS GRAFT DECLOT OR  Thrombosis right forearm, loop arteriovenous   Thrombosis right forearm, loop arteriovenous graft  . FISTULA SUPERFICIALIZATION Left 7/42/5956   Procedure: PLICATIONS OF ANEURYSM;  Surgeon: [name], MD;  Location: [facility];  Service: Vascular;  Laterality: Left;  [name] GASTROSTOMY TUBE PLACEMENT    . INSERTION OF DIALYSIS CATHETER N/A 04/04/2020   Procedure: TUNNELED DIALYSIS CATHETER PLACEMENT;  Surgeon: [name], MD;  Location: [place];  Service: Vascular;  Laterality: N/A;  . KIDNEY TRANSPLANT  2009   Both  . PERIPHERAL VASCULAR BALLOON ANGIOPLASTY Left 03/24/2019   Procedure: PERIPHERAL VASCULAR BALLOON ANGIOPLASTY;  Surgeon: [name], MD;  Location: [place] [facility];   Service: Cardiovascular;  Laterality: Left;  left AV fistula  . PERIPHERAL VASCULAR BALLOON ANGIOPLASTY  03/08/2020   Procedure: PERIPHERAL VASCULAR BALLOON ANGIOPLASTY;  Surgeon: [name], MD;  Location: [place] [facility];  Service: Cardiovascular;;  . REMOVAL OF GASTROSTOMY TUBE    . REVISON OF ARTERIOVENOUS FISTULA Left 11/23/2018   Procedure: REVISION PLICATION OF ARTERIOVENOUS FISTULA;  Surgeon: Waynetta Sandy, MD;  Location: [facility];  Service: Vascular;  Laterality: Left;  . REVISON OF ARTERIOVENOUS FISTULA Left 04/04/2020   Procedure: LEFT ARM ARTERIOVENOUS FISTULA REVISON;  Surgeon: [name], MD;  Location: [place];  Service: Vascular;  Laterality: Left;  . THROMBECTOMY / ARTERIOVENOUS GRAFT REVISION  10/12/2006  . THROMBECTOMY / ARTERIOVENOUS GRAFT REVISION  10/16/2006     OB History   No obstetric history on file.     Family History  Problem Relation Age of Onset  . Kidney disease Paternal Aunt   . Heart disease Mother   .  Heart disease Father   . Colon cancer Neg Hx     Social History   Tobacco Use  . Smoking status: Former Research scientist (life sciences)  . Smokeless tobacco: Never Used  . Tobacco comment: smoked in teens  Vaping Use  . Vaping Use: Never used  Substance Use Topics  . Alcohol use: Not Currently  . Drug use: No    Home Medications Prior to Admission medications   Medication Sig Start Date End Date Taking? Authorizing Provider  acetaminophen (TYLENOL) 650 MG CR tablet Take 650-1,300 mg by mouth every 8 (eight) hours as needed for pain.     [provider]  albuterol (VENTOLIN HFA) 108 (90 Base) MCG/ACT inhaler INHALE 2 PUFFS INTO THE LUNGS EVERY 4 (FOUR) HOURS AS NEEDED FOR WHEEZING OR SHORTNESS OF BREATH. 11/30/19   Midge Minium, MD  amLODipine (NORVASC) 10 MG tablet TAKE 1 TABLET BY MOUTH EVERY DAY 07/09/20   Midge Minium, MD  atorvastatin (LIPITOR) 40 MG tablet Take 1 tablet (40 mg total) by mouth daily. Please make  yearly appt with Dr. Acie Fredrickson for April 2022 for future refills. Thank you 1st attempt 05/16/20   Nahser, Wonda Cheng, MD  blood glucose meter kit and supplies KIT Dispense based on patient and insurance preference. Patient should test sugars twice daily as directed. Dx. E11.9 12/10/18   Midge Minium, MD  cetirizine (ZYRTEC) 10 MG tablet TAKE 1 TABLET BY MOUTH EVERY DAY Patient taking differently: Take 10 mg by mouth daily. 07/27/19   Midge Minium, MD  DULoxetine (CYMBALTA) 20 MG capsule TAKE 1 CAPSULE BY MOUTH EVERY DAY 07/09/20   Midge Minium, MD  ferric citrate (AURYXIA) 1 GM 210 MG(Fe) tablet Take 210 mg by mouth See admin instructions. Take 210 mg by mouth three times a day with meals and 210 mg two times a day with snacks    [provider]  hydrALAZINE (APRESOLINE) 100 MG tablet Take 1 tablet (100 mg total) by mouth 2 (two) times daily. 05/16/20   Midge Minium, MD  HYDROcodone-acetaminophen (NORCO/VICODIN) 5-325 MG tablet Take 1 tablet by mouth every 6 (six) hours as needed for moderate pain. 07/11/20   Midge Minium, MD  isosorbide dinitrate (ISORDIL) 20 MG tablet TAKE 1 TABLET BY MOUTH THREE TIMES A DAY Patient taking differently: Take 20 mg by mouth 3 (three) times daily. 03/22/20   Midge Minium, MD  Methoxy PEG-Epoetin Beta (MIRCERA IJ) Mircera 11/02/19 10/31/20  [provider]  multivitamin (RENA-VIT) TABS tablet Take 1 tablet by mouth every morning.  03/20/17   [provider]  nitroGLYCERIN (NITROSTAT) 0.4 MG SL tablet Place 0.4 mg under the tongue every 5 (five) minutes as needed for chest pain.  06/16/13   [provider]  omeprazole (PRILOSEC) 40 MG capsule TAKE 1 CAPSULE BY MOUTH EVERY DAY Patient taking differently: Take 40 mg by mouth daily. 05/16/20   Midge Minium, MD  [facility] ULTRA test strip USE AS INSTRUCTED TO TEST SUGARS TWICE DAILY. DX. E11.9 04/27/20   Midge Minium, MD  QUEtiapine (SEROQUEL) 200 MG  tablet Take 200 mg by mouth 2 (two) times daily. 06/01/20   [provider]  traZODone (DESYREL) 50 MG tablet Take 50 mg by mouth at bedtime as needed for sleep.  07/29/19   [provider]    Allergies    Lisinopril, Lyrica [pregabalin], and Daypro [oxaprozin]  Review of Systems   Review of Systems  All other systems reviewed  and are negative.   Physical Exam Updated Vital Signs BP (!) 154/92   Pulse 88   Temp (!) 97.4 F (36.3 C) (Oral)   Resp 18   Ht '5\' 6"'  (1.676 m)   Wt 90.7 kg   SpO2 100%   BMI 32.28 kg/m   Physical Exam Vitals and nursing note reviewed.  Constitutional:      General: She is not in acute distress.    Appearance: Normal appearance. She is not ill-appearing.  HENT:     Head: Normocephalic and atraumatic.  Pulmonary:     Effort: Pulmonary effort is normal.  Musculoskeletal:     Comments: The left great toe appears grossly normal.  There is no obvious swelling or redness.  There are no ulcers or skin breakdown.  Skin:    General: Skin is warm and dry.  Neurological:     Mental Status: She is alert and oriented to person, place, and time.     ED Results / Procedures / Treatments   Labs (all labs ordered are listed, but only abnormal results are displayed) Labs Reviewed - No data to display  EKG None  Radiology DG Foot Complete Left  Result Date: 07/14/2020 CLINICAL DATA:  Left great toe pain for 2 days. EXAM: LEFT FOOT - COMPLETE 3+ VIEW COMPARISON:  None. FINDINGS: Diffuse bone demineralization. Degenerative changes in the interphalangeal joints, metatarsal-phalangeal joints, and intertarsal joints. No focal bone lesion or bone destruction. Calcaneal spurs. There is a tiny radiopaque foreign body demonstrated in the lateral aspect of the left first toe adjacent to the base of the distal phalanx. This could be surface contamination or a soft tissue foreign body. Consider ultrasound for further evaluation. IMPRESSION: 1.  Degenerative changes in the left foot. No acute bony abnormalities. 2. Tiny radiopaque foreign body demonstrated in the lateral aspect of the left first toe. Consider ultrasound for further evaluation. Electronically Signed   By: Lucienne Capers M.D.   On: 07/14/2020 02:29    Procedures Procedures   Medications Ordered in ED Medications - No data to display  ED Course  I have reviewed the triage vital signs and the nursing notes.  Pertinent labs & imaging results that were available during my care of the patient were reviewed by me and considered in my medical decision making (see chart for details).    MDM Rules/Calculators/A&P  Patient presenting with nontraumatic left great toe pain.  I suspect gout.  Patient will be treated with prednisone and follow-up as needed.  Final Clinical Impression(s) / ED Diagnoses Final diagnoses:  None    Rx / DC Orders ED Discharge Orders    None       Veryl Speak, MD 07/14/20 [phone]

## 2020-07-14 NOTE — ED Triage Notes (Signed)
Patient arrives with [facility] from home, reports L great toe pain x 2 days, called PCP who prescribed hydrocodone which is making it worse, ESRD HD M/W/F, last on Friday.

## 2020-07-16 DIAGNOSIS — Z992 Dependence on renal dialysis: Secondary | ICD-10-CM | POA: Diagnosis not present

## 2020-07-16 DIAGNOSIS — N2581 Secondary hyperparathyroidism of renal origin: Secondary | ICD-10-CM | POA: Diagnosis not present

## 2020-07-16 DIAGNOSIS — L299 Pruritus, unspecified: Secondary | ICD-10-CM | POA: Diagnosis not present

## 2020-07-16 DIAGNOSIS — D509 Iron deficiency anemia, unspecified: Secondary | ICD-10-CM | POA: Diagnosis not present

## 2020-07-16 DIAGNOSIS — N186 End stage renal disease: Secondary | ICD-10-CM | POA: Diagnosis not present

## 2020-07-16 DIAGNOSIS — D689 Coagulation defect, unspecified: Secondary | ICD-10-CM | POA: Diagnosis not present

## 2020-07-17 ENCOUNTER — Encounter: Payer: Self-pay | Admitting: Family Medicine

## 2020-07-17 ENCOUNTER — Ambulatory Visit (INDEPENDENT_AMBULATORY_CARE_PROVIDER_SITE_OTHER): Payer: Medicare HMO | Admitting: Family Medicine

## 2020-07-17 ENCOUNTER — Telehealth: Payer: Self-pay

## 2020-07-17 VITALS — BP 150/90 | HR 82 | Temp 97.3°F | Resp 18 | Ht 66.0 in | Wt 189.2 lb

## 2020-07-17 DIAGNOSIS — Z992 Dependence on renal dialysis: Secondary | ICD-10-CM

## 2020-07-17 DIAGNOSIS — M109 Gout, unspecified: Secondary | ICD-10-CM | POA: Insufficient documentation

## 2020-07-17 DIAGNOSIS — E1122 Type 2 diabetes mellitus with diabetic chronic kidney disease: Secondary | ICD-10-CM

## 2020-07-17 DIAGNOSIS — E785 Hyperlipidemia, unspecified: Secondary | ICD-10-CM

## 2020-07-17 DIAGNOSIS — E1169 Type 2 diabetes mellitus with other specified complication: Secondary | ICD-10-CM | POA: Diagnosis not present

## 2020-07-17 DIAGNOSIS — Z1211 Encounter for screening for malignant neoplasm of colon: Secondary | ICD-10-CM

## 2020-07-17 DIAGNOSIS — I10 Essential (primary) hypertension: Secondary | ICD-10-CM

## 2020-07-17 DIAGNOSIS — J9611 Chronic respiratory failure with hypoxia: Secondary | ICD-10-CM | POA: Diagnosis not present

## 2020-07-17 DIAGNOSIS — N186 End stage renal disease: Secondary | ICD-10-CM | POA: Diagnosis not present

## 2020-07-17 DIAGNOSIS — M10372 Gout due to renal impairment, left ankle and foot: Secondary | ICD-10-CM

## 2020-07-17 LAB — CBC WITH DIFFERENTIAL/PLATELET
Basophils Absolute: 0.1 10*3/uL (ref 0.0–0.1)
Basophils Relative: 0.8 % (ref 0.0–3.0)
Eosinophils Absolute: 0.2 10*3/uL (ref 0.0–0.7)
Eosinophils Relative: 2.9 % (ref 0.0–5.0)
HCT: 32.1 % — ABNORMAL LOW (ref 36.0–46.0)
Hemoglobin: 10.7 g/dL — ABNORMAL LOW (ref 12.0–15.0)
Lymphocytes Relative: 23.1 % (ref 12.0–46.0)
Lymphs Abs: 1.5 10*3/uL (ref 0.7–4.0)
MCHC: 33.4 g/dL (ref 30.0–36.0)
MCV: 97 fl (ref 78.0–100.0)
Monocytes Absolute: 0.6 10*3/uL (ref 0.1–1.0)
Monocytes Relative: 8.7 % (ref 3.0–12.0)
Neutro Abs: 4.2 10*3/uL (ref 1.4–7.7)
Neutrophils Relative %: 64.5 % (ref 43.0–77.0)
Platelets: 203 10*3/uL (ref 150.0–400.0)
RBC: 3.3 Mil/uL — ABNORMAL LOW (ref 3.87–5.11)
RDW: 14.7 % (ref 11.5–15.5)
WBC: 6.5 10*3/uL (ref 4.0–10.5)

## 2020-07-17 LAB — HEPATIC FUNCTION PANEL
ALT: 13 U/L (ref 0–35)
AST: 17 U/L (ref 0–37)
Albumin: 3.9 g/dL (ref 3.5–5.2)
Alkaline Phosphatase: 116 U/L (ref 39–117)
Bilirubin, Direct: 0.1 mg/dL (ref 0.0–0.3)
Total Bilirubin: 0.4 mg/dL (ref 0.2–1.2)
Total Protein: 6.9 g/dL (ref 6.0–8.3)

## 2020-07-17 LAB — BASIC METABOLIC PANEL
BUN: 22 mg/dL (ref 6–23)
CO2: 33 mEq/L — ABNORMAL HIGH (ref 19–32)
Calcium: 9.1 mg/dL (ref 8.4–10.5)
Chloride: 94 mEq/L — ABNORMAL LOW (ref 96–112)
Creatinine, Ser: 7.58 mg/dL (ref 0.40–1.20)
GFR: 5.32 mL/min — CL (ref >60.00)
Glucose, Bld: 189 mg/dL — ABNORMAL HIGH (ref 70–99)
Potassium: 3.3 mEq/L — ABNORMAL LOW (ref 3.5–5.1)
Sodium: 138 mEq/L (ref 135–145)

## 2020-07-17 LAB — URIC ACID: Uric Acid, Serum: 3.6 mg/dL (ref 2.4–7.0)

## 2020-07-17 LAB — LIPID PANEL
Cholesterol: 166 mg/dL (ref 0–200)
HDL: 35.1 mg/dL — ABNORMAL LOW (ref >39.00)
LDL Cholesterol: 96 mg/dL (ref 0–99)
NonHDL: 130.51
Total CHOL/HDL Ratio: 5
Triglycerides: 172 mg/dL — ABNORMAL HIGH (ref 0.0–149.0)
VLDL: 34.4 mg/dL (ref 0.0–40.0)

## 2020-07-17 LAB — HEMOGLOBIN A1C: Hgb A1c MFr Bld: 6.7 % — ABNORMAL HIGH (ref 4.6–6.5)

## 2020-07-17 LAB — TSH: TSH: 1.05 u[IU]/mL (ref 0.35–4.50)

## 2020-07-17 MED ORDER — LOSARTAN POTASSIUM 50 MG PO TABS: 50.0000 mg | ORAL_TABLET | Freq: Every day | ORAL | 3 refills | Status: DC

## 2020-07-17 NOTE — Assessment & Plan Note (Signed)
Pt was treated for gout by ER but no lab work done.  Pain has improved w/ prednisone.  Will get Uric Acid due to renal insufficiency.  Pt expressed understanding and is in agreement w/ plan.

## 2020-07-17 NOTE — Assessment & Plan Note (Signed)
Pt reports she is on 2.5L O2 at home but she does not use the O2 when she goes out.  She states she is not short of breath.  O2 on RA in office today 96%.  Not sure if pt needs home O2 but will refer to pulmonary for complete evaluation.  Pt expressed understanding and is in agreement w/ plan.

## 2020-07-17 NOTE — Patient Instructions (Signed)
Follow up in 1 month to recheck BP We'll notify you of your lab results and make any changes if needed Call and reschedule your eye exam We'll call you with your pulmonary appt and GI appt for colonoscopy ADD the Losartan 50mg  daily to help control the blood pressure Call with any questions or concerns [place]!!!

## 2020-07-17 NOTE — Assessment & Plan Note (Signed)
Chronic problem.  On Lipitor 40mg daily.  Check labs.  Adjust meds prn  

## 2020-07-17 NOTE — Assessment & Plan Note (Signed)
Chronic problem.  Pt's A1C has been excellently controlled recently.  Last A1C 5.7%.  No need for microalbumin due to HD.  Pt to reschedule eye exam.  Foot exam done today.  Check labs.

## 2020-07-17 NOTE — Assessment & Plan Note (Signed)
Deteriorated.  Pt's BP is elevated today and she reports it has been running high at HD too.  Already on max dose Amlodipine and Hydralazine.  Will restart Losartan 50mg  daily which she has taken in the past w/o difficulty.  Pt expressed understanding and is in agreement w/ plan.

## 2020-07-17 NOTE — Progress Notes (Signed)
   Subjective:    Patient ID: [name] SOBOTTA, female    DOB: 03-16-1956, 65 y.o.   MRN: [id number]  HPI ER f/u- pt was seen in the ER on 4/23 for L great toe pain.  No relief w/ Hydrocodone.  There was no swelling or redness.  She was treated w/ Prednisone for suspected gout but no labs were done.  Toe is no longer painful.    HTN- chronic problem, on Amlodipine 10mg  daily, Hydralazine 100mg  BID.  BP is elevated today.  Pt reports BP has been running high at dialysis as well.  No CP, SOB, HAs, visual changes.  Hyperlipidemia- chronic problem.  On Lipitor 40mg  daily.  Overdue for labs.  No abd pain, N/V.  DM- chronic problem, has been diet controlled.  Due for eye exam- she needs to reschedule.  Hypoxia- pt was d/c'd home on 2.5L O2 but she does not use this when she goes out.  Pt denies SOB off her O2.  Today's O2 sat 96%.  Due for colonoscopy- Dr Fuller Plan   Review of Systems For ROS see HPI   This visit occurred during the SARS-CoV-2 public health emergency.  Safety protocols were in place, including screening questions prior to the visit, additional usage of staff PPE, and extensive cleaning of exam room while observing appropriate contact time as indicated for disinfecting solutions.       Objective:   Physical Exam Vitals reviewed.  Constitutional:      General: She is not in acute distress.    Appearance: Normal appearance. She is not ill-appearing.  HENT:     Head: Normocephalic and atraumatic.  Eyes:     Extraocular Movements: Extraocular movements intact.     Conjunctiva/sclera: Conjunctivae normal.     Pupils: Pupils are equal, round, and reactive to light.  Cardiovascular:     Rate and Rhythm: Normal rate and regular rhythm.     Pulses: Normal pulses.     Heart sounds: Normal heart sounds.  Pulmonary:     Effort: Pulmonary effort is normal. No respiratory distress.     Breath sounds: Normal breath sounds. No wheezing, rhonchi or rales.  Abdominal:     General:  Abdomen is flat. There is no distension.     Tenderness: There is no abdominal tenderness. There is no guarding or rebound.  Musculoskeletal:     Cervical back: Normal range of motion and neck supple. No tenderness.     Right lower leg: No edema.     Left lower leg: No edema.  Lymphadenopathy:     Cervical: No cervical adenopathy.  Skin:    General: Skin is warm and dry.  Neurological:     General: No focal deficit present.     Mental Status: She is alert and oriented to person, place, and time.  Psychiatric:        Mood and Affect: Mood normal.        Behavior: Behavior normal.        Thought Content: Thought content normal.           Assessment & Plan:

## 2020-07-18 DIAGNOSIS — D509 Iron deficiency anemia, unspecified: Secondary | ICD-10-CM | POA: Diagnosis not present

## 2020-07-18 DIAGNOSIS — L299 Pruritus, unspecified: Secondary | ICD-10-CM | POA: Diagnosis not present

## 2020-07-18 DIAGNOSIS — N2581 Secondary hyperparathyroidism of renal origin: Secondary | ICD-10-CM | POA: Diagnosis not present

## 2020-07-18 DIAGNOSIS — N186 End stage renal disease: Secondary | ICD-10-CM | POA: Diagnosis not present

## 2020-07-18 DIAGNOSIS — Z992 Dependence on renal dialysis: Secondary | ICD-10-CM | POA: Diagnosis not present

## 2020-07-18 DIAGNOSIS — D689 Coagulation defect, unspecified: Secondary | ICD-10-CM | POA: Diagnosis not present

## 2020-07-18 NOTE — Telephone Encounter (Signed)
Pt on dialysis.  No cause for concern

## 2020-07-19 DIAGNOSIS — R531 Weakness: Secondary | ICD-10-CM | POA: Diagnosis not present

## 2020-07-19 DIAGNOSIS — Z992 Dependence on renal dialysis: Secondary | ICD-10-CM | POA: Diagnosis not present

## 2020-07-19 DIAGNOSIS — N186 End stage renal disease: Secondary | ICD-10-CM | POA: Diagnosis not present

## 2020-07-20 DIAGNOSIS — D509 Iron deficiency anemia, unspecified: Secondary | ICD-10-CM | POA: Diagnosis not present

## 2020-07-20 DIAGNOSIS — N2581 Secondary hyperparathyroidism of renal origin: Secondary | ICD-10-CM | POA: Diagnosis not present

## 2020-07-20 DIAGNOSIS — N186 End stage renal disease: Secondary | ICD-10-CM | POA: Diagnosis not present

## 2020-07-20 DIAGNOSIS — L299 Pruritus, unspecified: Secondary | ICD-10-CM | POA: Diagnosis not present

## 2020-07-20 DIAGNOSIS — Z992 Dependence on renal dialysis: Secondary | ICD-10-CM | POA: Diagnosis not present

## 2020-07-20 DIAGNOSIS — D689 Coagulation defect, unspecified: Secondary | ICD-10-CM | POA: Diagnosis not present

## 2020-07-21 DIAGNOSIS — N186 End stage renal disease: Secondary | ICD-10-CM | POA: Diagnosis not present

## 2020-07-21 DIAGNOSIS — Z992 Dependence on renal dialysis: Secondary | ICD-10-CM | POA: Diagnosis not present

## 2020-07-21 DIAGNOSIS — I150 Renovascular hypertension: Secondary | ICD-10-CM | POA: Diagnosis not present

## 2020-07-22 DIAGNOSIS — R0602 Shortness of breath: Secondary | ICD-10-CM | POA: Diagnosis not present

## 2020-07-23 DIAGNOSIS — L299 Pruritus, unspecified: Secondary | ICD-10-CM | POA: Diagnosis not present

## 2020-07-23 DIAGNOSIS — D689 Coagulation defect, unspecified: Secondary | ICD-10-CM | POA: Diagnosis not present

## 2020-07-23 DIAGNOSIS — N186 End stage renal disease: Secondary | ICD-10-CM | POA: Diagnosis not present

## 2020-07-23 DIAGNOSIS — T8249XD Other complication of vascular dialysis catheter, subsequent encounter: Secondary | ICD-10-CM | POA: Diagnosis not present

## 2020-07-23 DIAGNOSIS — Z992 Dependence on renal dialysis: Secondary | ICD-10-CM | POA: Diagnosis not present

## 2020-07-23 DIAGNOSIS — N2581 Secondary hyperparathyroidism of renal origin: Secondary | ICD-10-CM | POA: Diagnosis not present

## 2020-07-25 DIAGNOSIS — L299 Pruritus, unspecified: Secondary | ICD-10-CM | POA: Diagnosis not present

## 2020-07-25 DIAGNOSIS — T8249XD Other complication of vascular dialysis catheter, subsequent encounter: Secondary | ICD-10-CM | POA: Diagnosis not present

## 2020-07-25 DIAGNOSIS — N2581 Secondary hyperparathyroidism of renal origin: Secondary | ICD-10-CM | POA: Diagnosis not present

## 2020-07-25 DIAGNOSIS — Z992 Dependence on renal dialysis: Secondary | ICD-10-CM | POA: Diagnosis not present

## 2020-07-25 DIAGNOSIS — N186 End stage renal disease: Secondary | ICD-10-CM | POA: Diagnosis not present

## 2020-07-25 DIAGNOSIS — D689 Coagulation defect, unspecified: Secondary | ICD-10-CM | POA: Diagnosis not present

## 2020-07-27 ENCOUNTER — Other Ambulatory Visit: Payer: Self-pay | Admitting: Family Medicine

## 2020-07-27 DIAGNOSIS — Z992 Dependence on renal dialysis: Secondary | ICD-10-CM | POA: Diagnosis not present

## 2020-07-27 DIAGNOSIS — D689 Coagulation defect, unspecified: Secondary | ICD-10-CM | POA: Diagnosis not present

## 2020-07-27 DIAGNOSIS — N186 End stage renal disease: Secondary | ICD-10-CM | POA: Diagnosis not present

## 2020-07-27 DIAGNOSIS — N2581 Secondary hyperparathyroidism of renal origin: Secondary | ICD-10-CM | POA: Diagnosis not present

## 2020-07-27 DIAGNOSIS — I10 Essential (primary) hypertension: Secondary | ICD-10-CM

## 2020-07-27 DIAGNOSIS — L299 Pruritus, unspecified: Secondary | ICD-10-CM | POA: Diagnosis not present

## 2020-07-27 DIAGNOSIS — T8249XD Other complication of vascular dialysis catheter, subsequent encounter: Secondary | ICD-10-CM | POA: Diagnosis not present

## 2020-07-30 ENCOUNTER — Other Ambulatory Visit: Payer: Self-pay

## 2020-07-30 DIAGNOSIS — N2581 Secondary hyperparathyroidism of renal origin: Secondary | ICD-10-CM | POA: Diagnosis not present

## 2020-07-30 DIAGNOSIS — N186 End stage renal disease: Secondary | ICD-10-CM | POA: Diagnosis not present

## 2020-07-30 DIAGNOSIS — D689 Coagulation defect, unspecified: Secondary | ICD-10-CM | POA: Diagnosis not present

## 2020-07-30 DIAGNOSIS — Z992 Dependence on renal dialysis: Secondary | ICD-10-CM | POA: Diagnosis not present

## 2020-07-30 DIAGNOSIS — L299 Pruritus, unspecified: Secondary | ICD-10-CM | POA: Diagnosis not present

## 2020-08-01 DIAGNOSIS — Z992 Dependence on renal dialysis: Secondary | ICD-10-CM | POA: Diagnosis not present

## 2020-08-01 DIAGNOSIS — D689 Coagulation defect, unspecified: Secondary | ICD-10-CM | POA: Diagnosis not present

## 2020-08-01 DIAGNOSIS — N186 End stage renal disease: Secondary | ICD-10-CM | POA: Diagnosis not present

## 2020-08-01 DIAGNOSIS — N2581 Secondary hyperparathyroidism of renal origin: Secondary | ICD-10-CM | POA: Diagnosis not present

## 2020-08-01 DIAGNOSIS — L299 Pruritus, unspecified: Secondary | ICD-10-CM | POA: Diagnosis not present

## 2020-08-02 ENCOUNTER — Telehealth: Payer: Self-pay | Admitting: Family Medicine

## 2020-08-02 NOTE — Telephone Encounter (Signed)
..  Home Health Certification or Plan of Care Tracking  Is this a Certification or Plan of Care?Yes  HH Agency:[facility]  Order Number:  [id number] Has charge sheet been attached? yes  Where has form been placed:  In Dr. Virgil Benedict bin

## 2020-08-03 DIAGNOSIS — L299 Pruritus, unspecified: Secondary | ICD-10-CM | POA: Diagnosis not present

## 2020-08-03 DIAGNOSIS — N186 End stage renal disease: Secondary | ICD-10-CM | POA: Diagnosis not present

## 2020-08-03 DIAGNOSIS — N2581 Secondary hyperparathyroidism of renal origin: Secondary | ICD-10-CM | POA: Diagnosis not present

## 2020-08-03 DIAGNOSIS — Z992 Dependence on renal dialysis: Secondary | ICD-10-CM | POA: Diagnosis not present

## 2020-08-03 DIAGNOSIS — D689 Coagulation defect, unspecified: Secondary | ICD-10-CM | POA: Diagnosis not present

## 2020-08-03 NOTE — Telephone Encounter (Signed)
Form completed and placed in basket  

## 2020-08-03 NOTE — Telephone Encounter (Signed)
Forms placed in bin to be filled 

## 2020-08-03 NOTE — Telephone Encounter (Signed)
Forms picked up from the back, faxed to the # provided on the form and sent to scan.

## 2020-08-05 ENCOUNTER — Other Ambulatory Visit: Payer: Self-pay | Admitting: Family Medicine

## 2020-08-06 DIAGNOSIS — N186 End stage renal disease: Secondary | ICD-10-CM | POA: Diagnosis not present

## 2020-08-06 DIAGNOSIS — N2581 Secondary hyperparathyroidism of renal origin: Secondary | ICD-10-CM | POA: Diagnosis not present

## 2020-08-06 DIAGNOSIS — Z992 Dependence on renal dialysis: Secondary | ICD-10-CM | POA: Diagnosis not present

## 2020-08-06 DIAGNOSIS — L299 Pruritus, unspecified: Secondary | ICD-10-CM | POA: Diagnosis not present

## 2020-08-06 DIAGNOSIS — D689 Coagulation defect, unspecified: Secondary | ICD-10-CM | POA: Diagnosis not present

## 2020-08-07 DIAGNOSIS — M792 Neuralgia and neuritis, unspecified: Secondary | ICD-10-CM | POA: Diagnosis not present

## 2020-08-07 DIAGNOSIS — R0602 Shortness of breath: Secondary | ICD-10-CM | POA: Diagnosis not present

## 2020-08-08 DIAGNOSIS — L299 Pruritus, unspecified: Secondary | ICD-10-CM | POA: Diagnosis not present

## 2020-08-08 DIAGNOSIS — N186 End stage renal disease: Secondary | ICD-10-CM | POA: Diagnosis not present

## 2020-08-08 DIAGNOSIS — N2581 Secondary hyperparathyroidism of renal origin: Secondary | ICD-10-CM | POA: Diagnosis not present

## 2020-08-08 DIAGNOSIS — D689 Coagulation defect, unspecified: Secondary | ICD-10-CM | POA: Diagnosis not present

## 2020-08-08 DIAGNOSIS — Z992 Dependence on renal dialysis: Secondary | ICD-10-CM | POA: Diagnosis not present

## 2020-08-10 DIAGNOSIS — N2581 Secondary hyperparathyroidism of renal origin: Secondary | ICD-10-CM | POA: Diagnosis not present

## 2020-08-10 DIAGNOSIS — L299 Pruritus, unspecified: Secondary | ICD-10-CM | POA: Diagnosis not present

## 2020-08-10 DIAGNOSIS — Z992 Dependence on renal dialysis: Secondary | ICD-10-CM | POA: Diagnosis not present

## 2020-08-10 DIAGNOSIS — N186 End stage renal disease: Secondary | ICD-10-CM | POA: Diagnosis not present

## 2020-08-10 DIAGNOSIS — D689 Coagulation defect, unspecified: Secondary | ICD-10-CM | POA: Diagnosis not present

## 2020-08-15 DIAGNOSIS — N2581 Secondary hyperparathyroidism of renal origin: Secondary | ICD-10-CM | POA: Diagnosis not present

## 2020-08-15 DIAGNOSIS — L299 Pruritus, unspecified: Secondary | ICD-10-CM | POA: Diagnosis not present

## 2020-08-15 DIAGNOSIS — Z992 Dependence on renal dialysis: Secondary | ICD-10-CM | POA: Diagnosis not present

## 2020-08-15 DIAGNOSIS — N186 End stage renal disease: Secondary | ICD-10-CM | POA: Diagnosis not present

## 2020-08-15 DIAGNOSIS — D509 Iron deficiency anemia, unspecified: Secondary | ICD-10-CM | POA: Diagnosis not present

## 2020-08-15 DIAGNOSIS — D689 Coagulation defect, unspecified: Secondary | ICD-10-CM | POA: Diagnosis not present

## 2020-08-15 DIAGNOSIS — T8249XD Other complication of vascular dialysis catheter, subsequent encounter: Secondary | ICD-10-CM | POA: Diagnosis not present

## 2020-08-16 ENCOUNTER — Ambulatory Visit: Payer: Medicare HMO | Admitting: Family Medicine

## 2020-08-17 DIAGNOSIS — T8249XD Other complication of vascular dialysis catheter, subsequent encounter: Secondary | ICD-10-CM | POA: Diagnosis not present

## 2020-08-17 DIAGNOSIS — N186 End stage renal disease: Secondary | ICD-10-CM | POA: Diagnosis not present

## 2020-08-17 DIAGNOSIS — N2581 Secondary hyperparathyroidism of renal origin: Secondary | ICD-10-CM | POA: Diagnosis not present

## 2020-08-17 DIAGNOSIS — D689 Coagulation defect, unspecified: Secondary | ICD-10-CM | POA: Diagnosis not present

## 2020-08-17 DIAGNOSIS — L299 Pruritus, unspecified: Secondary | ICD-10-CM | POA: Diagnosis not present

## 2020-08-17 DIAGNOSIS — Z992 Dependence on renal dialysis: Secondary | ICD-10-CM | POA: Diagnosis not present

## 2020-08-17 DIAGNOSIS — D509 Iron deficiency anemia, unspecified: Secondary | ICD-10-CM | POA: Diagnosis not present

## 2020-08-20 DIAGNOSIS — Z992 Dependence on renal dialysis: Secondary | ICD-10-CM | POA: Diagnosis not present

## 2020-08-20 DIAGNOSIS — D509 Iron deficiency anemia, unspecified: Secondary | ICD-10-CM | POA: Diagnosis not present

## 2020-08-20 DIAGNOSIS — N2581 Secondary hyperparathyroidism of renal origin: Secondary | ICD-10-CM | POA: Diagnosis not present

## 2020-08-20 DIAGNOSIS — L299 Pruritus, unspecified: Secondary | ICD-10-CM | POA: Diagnosis not present

## 2020-08-20 DIAGNOSIS — N186 End stage renal disease: Secondary | ICD-10-CM | POA: Diagnosis not present

## 2020-08-21 DIAGNOSIS — N186 End stage renal disease: Secondary | ICD-10-CM | POA: Diagnosis not present

## 2020-08-21 DIAGNOSIS — I150 Renovascular hypertension: Secondary | ICD-10-CM | POA: Diagnosis not present

## 2020-08-21 DIAGNOSIS — Z992 Dependence on renal dialysis: Secondary | ICD-10-CM | POA: Diagnosis not present

## 2020-08-22 DIAGNOSIS — D509 Iron deficiency anemia, unspecified: Secondary | ICD-10-CM | POA: Diagnosis not present

## 2020-08-22 DIAGNOSIS — N2581 Secondary hyperparathyroidism of renal origin: Secondary | ICD-10-CM | POA: Diagnosis not present

## 2020-08-22 DIAGNOSIS — R0602 Shortness of breath: Secondary | ICD-10-CM | POA: Diagnosis not present

## 2020-08-22 DIAGNOSIS — N186 End stage renal disease: Secondary | ICD-10-CM | POA: Diagnosis not present

## 2020-08-22 DIAGNOSIS — D689 Coagulation defect, unspecified: Secondary | ICD-10-CM | POA: Diagnosis not present

## 2020-08-22 DIAGNOSIS — L299 Pruritus, unspecified: Secondary | ICD-10-CM | POA: Diagnosis not present

## 2020-08-22 DIAGNOSIS — Z992 Dependence on renal dialysis: Secondary | ICD-10-CM | POA: Diagnosis not present

## 2020-08-24 DIAGNOSIS — D509 Iron deficiency anemia, unspecified: Secondary | ICD-10-CM | POA: Diagnosis not present

## 2020-08-24 DIAGNOSIS — N2581 Secondary hyperparathyroidism of renal origin: Secondary | ICD-10-CM | POA: Diagnosis not present

## 2020-08-24 DIAGNOSIS — Z992 Dependence on renal dialysis: Secondary | ICD-10-CM | POA: Diagnosis not present

## 2020-08-24 DIAGNOSIS — L299 Pruritus, unspecified: Secondary | ICD-10-CM | POA: Diagnosis not present

## 2020-08-24 DIAGNOSIS — D689 Coagulation defect, unspecified: Secondary | ICD-10-CM | POA: Diagnosis not present

## 2020-08-24 DIAGNOSIS — N186 End stage renal disease: Secondary | ICD-10-CM | POA: Diagnosis not present

## 2020-08-27 DIAGNOSIS — L299 Pruritus, unspecified: Secondary | ICD-10-CM | POA: Diagnosis not present

## 2020-08-27 DIAGNOSIS — N2581 Secondary hyperparathyroidism of renal origin: Secondary | ICD-10-CM | POA: Diagnosis not present

## 2020-08-27 DIAGNOSIS — D509 Iron deficiency anemia, unspecified: Secondary | ICD-10-CM | POA: Diagnosis not present

## 2020-08-27 DIAGNOSIS — D689 Coagulation defect, unspecified: Secondary | ICD-10-CM | POA: Diagnosis not present

## 2020-08-27 DIAGNOSIS — T8249XD Other complication of vascular dialysis catheter, subsequent encounter: Secondary | ICD-10-CM | POA: Diagnosis not present

## 2020-08-27 DIAGNOSIS — N186 End stage renal disease: Secondary | ICD-10-CM | POA: Diagnosis not present

## 2020-08-27 DIAGNOSIS — Z992 Dependence on renal dialysis: Secondary | ICD-10-CM | POA: Diagnosis not present

## 2020-08-29 DIAGNOSIS — N2581 Secondary hyperparathyroidism of renal origin: Secondary | ICD-10-CM | POA: Diagnosis not present

## 2020-08-29 DIAGNOSIS — D689 Coagulation defect, unspecified: Secondary | ICD-10-CM | POA: Diagnosis not present

## 2020-08-29 DIAGNOSIS — D509 Iron deficiency anemia, unspecified: Secondary | ICD-10-CM | POA: Diagnosis not present

## 2020-08-29 DIAGNOSIS — Z992 Dependence on renal dialysis: Secondary | ICD-10-CM | POA: Diagnosis not present

## 2020-08-29 DIAGNOSIS — N186 End stage renal disease: Secondary | ICD-10-CM | POA: Diagnosis not present

## 2020-08-29 DIAGNOSIS — T8249XD Other complication of vascular dialysis catheter, subsequent encounter: Secondary | ICD-10-CM | POA: Diagnosis not present

## 2020-08-29 DIAGNOSIS — L299 Pruritus, unspecified: Secondary | ICD-10-CM | POA: Diagnosis not present

## 2020-08-31 DIAGNOSIS — L299 Pruritus, unspecified: Secondary | ICD-10-CM | POA: Diagnosis not present

## 2020-08-31 DIAGNOSIS — N186 End stage renal disease: Secondary | ICD-10-CM | POA: Diagnosis not present

## 2020-08-31 DIAGNOSIS — T8249XD Other complication of vascular dialysis catheter, subsequent encounter: Secondary | ICD-10-CM | POA: Diagnosis not present

## 2020-08-31 DIAGNOSIS — Z992 Dependence on renal dialysis: Secondary | ICD-10-CM | POA: Diagnosis not present

## 2020-08-31 DIAGNOSIS — N2581 Secondary hyperparathyroidism of renal origin: Secondary | ICD-10-CM | POA: Diagnosis not present

## 2020-08-31 DIAGNOSIS — D509 Iron deficiency anemia, unspecified: Secondary | ICD-10-CM | POA: Diagnosis not present

## 2020-08-31 DIAGNOSIS — D689 Coagulation defect, unspecified: Secondary | ICD-10-CM | POA: Diagnosis not present

## 2020-08-31 IMAGING — CR DG ANKLE COMPLETE 3+V*L*
3 series · 3 of 3 positions shown · non-contrast
Comparison: None.

CLINICAL DATA: Fall

EXAM:
LEFT ANKLE COMPLETE - 3+ VIEW

[ankle ap]
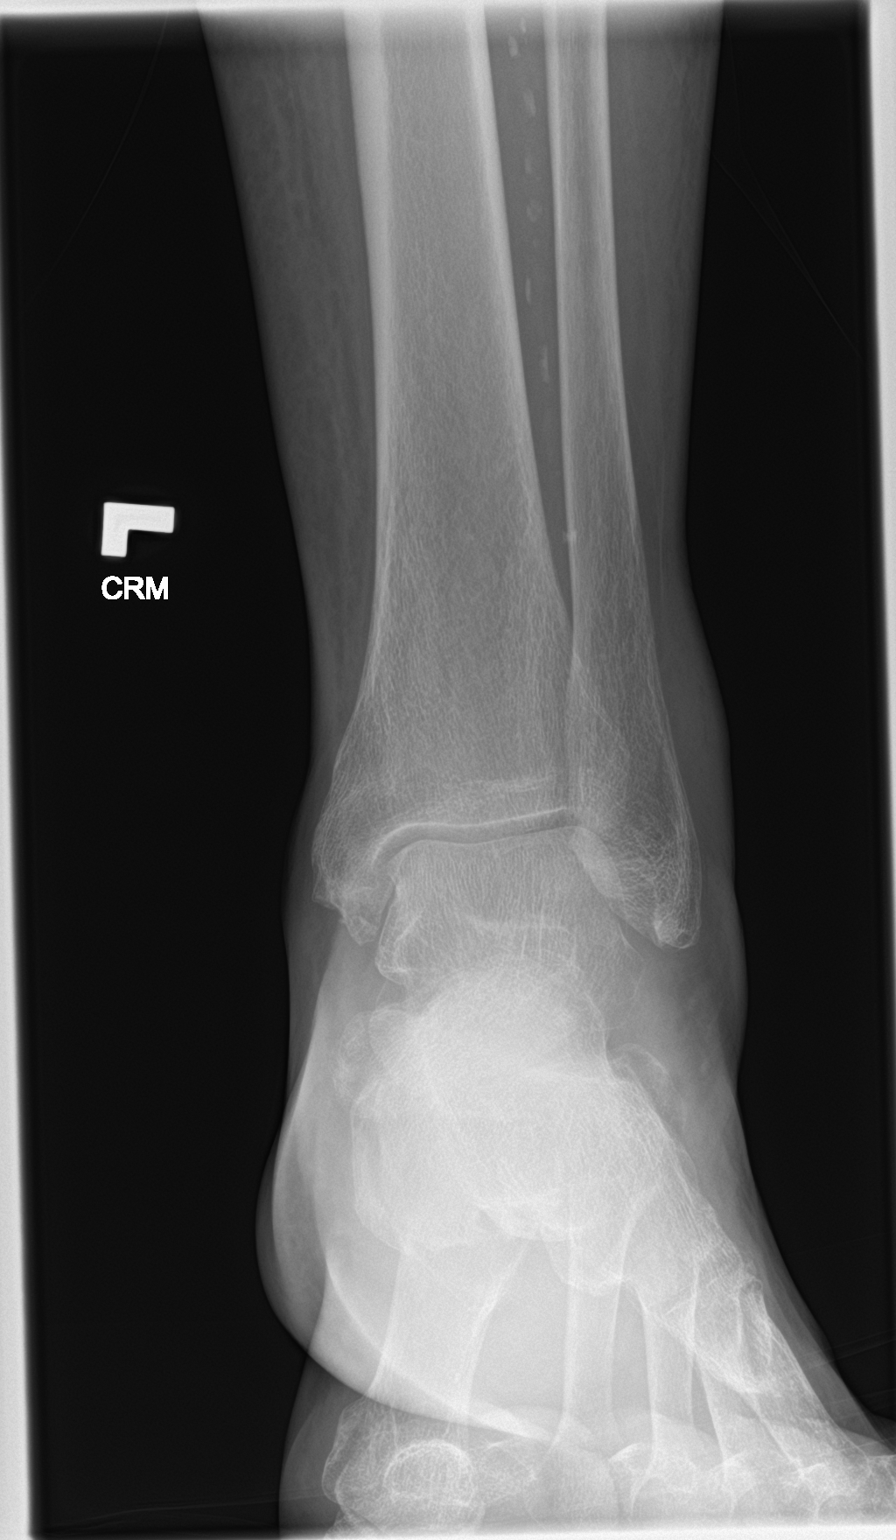

[ankle obl]
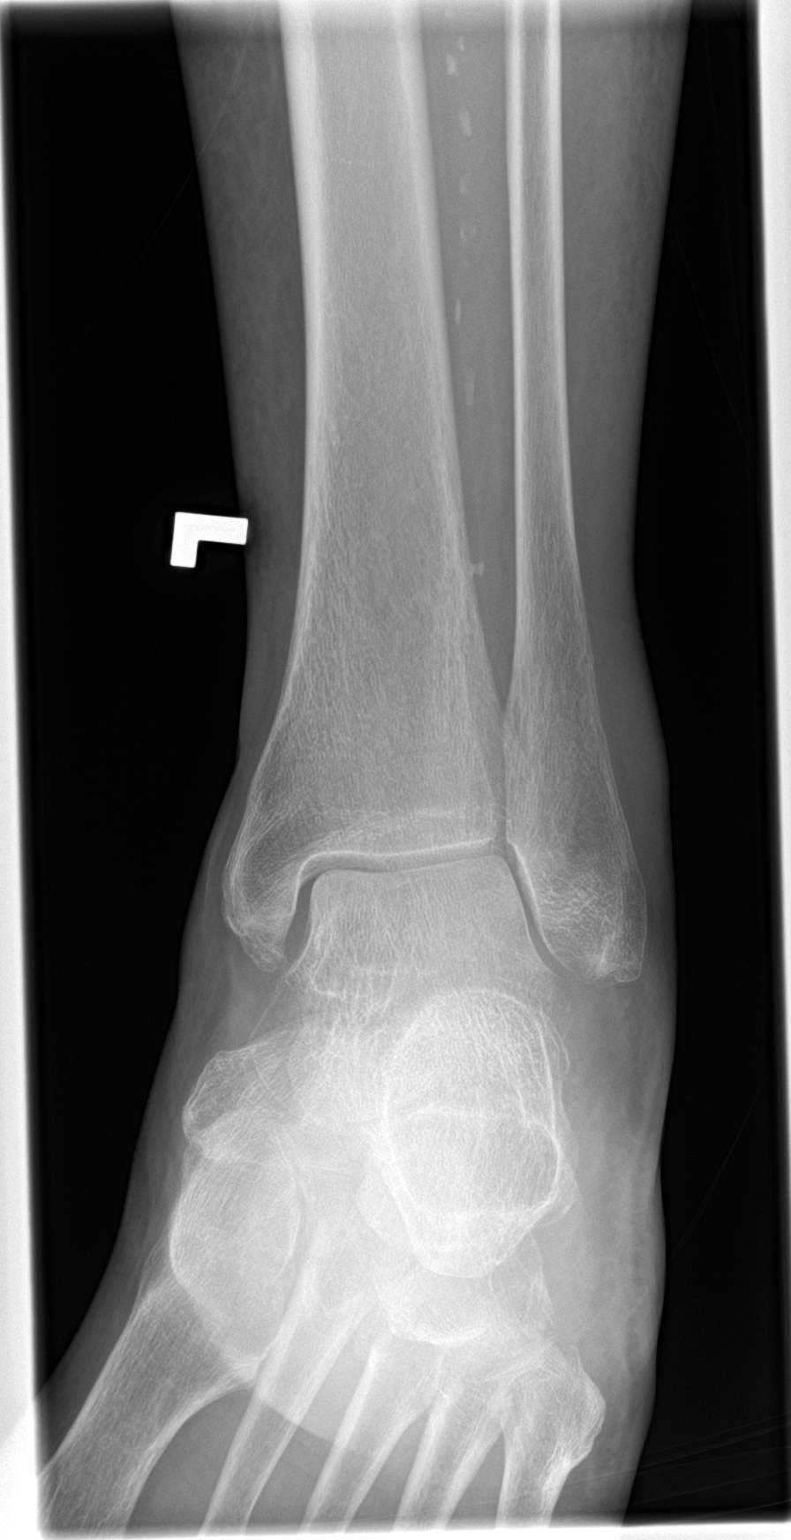

[ankle lat]
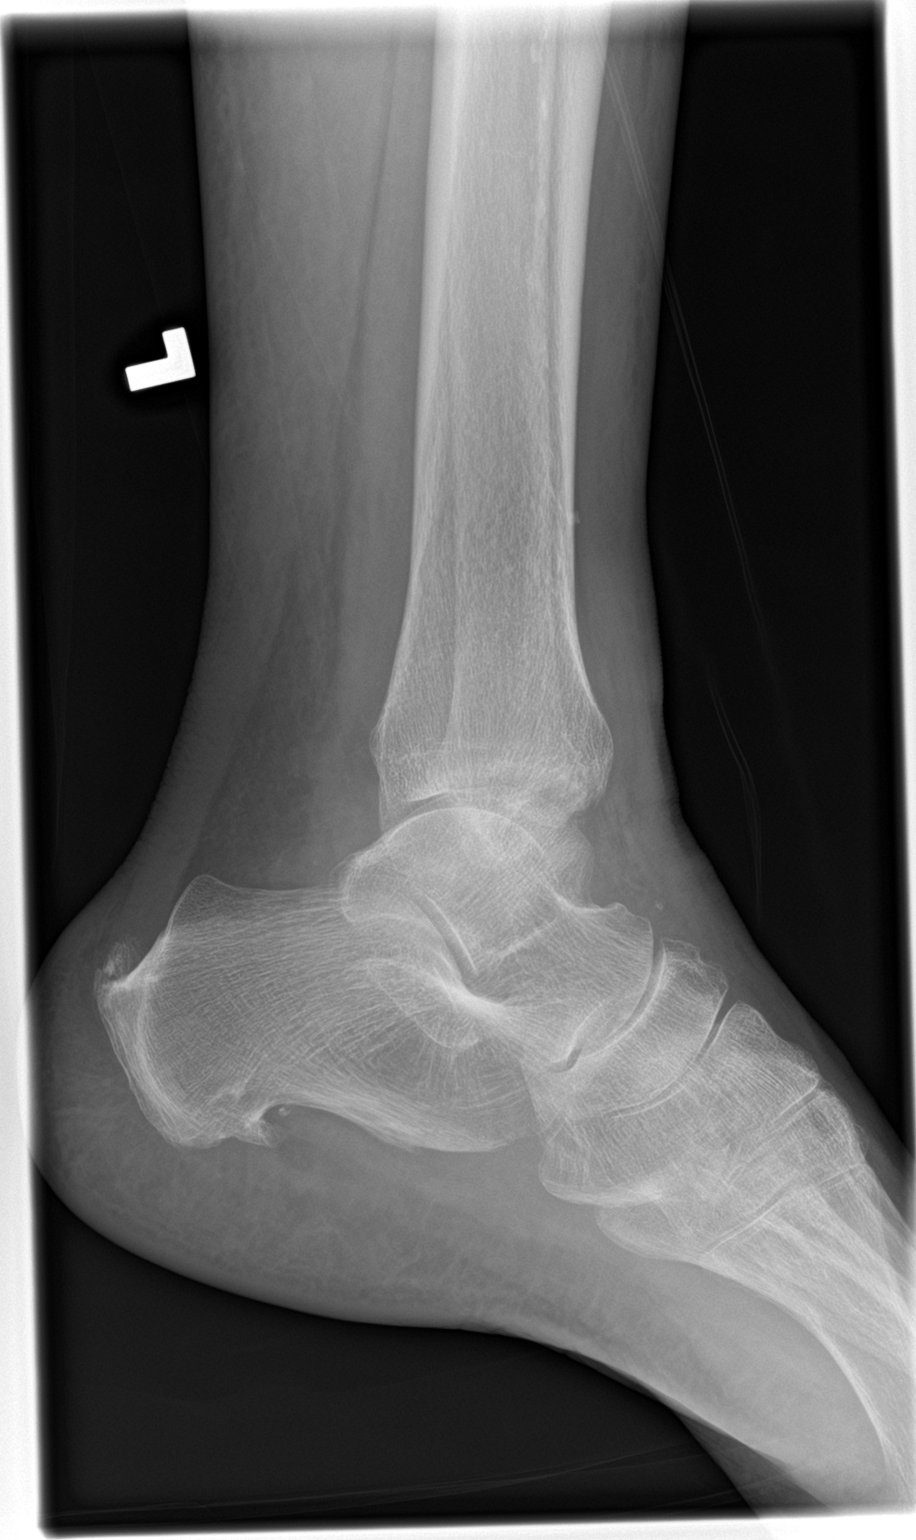

[3 of 3 positions shown; findings below may reference images not displayed]

FINDINGS: Alignment is anatomic. There is no acute fracture. Joint spaces are
preserved.
IMPRESSION: No acute fracture.

## 2020-09-03 DIAGNOSIS — D689 Coagulation defect, unspecified: Secondary | ICD-10-CM | POA: Diagnosis not present

## 2020-09-03 DIAGNOSIS — D509 Iron deficiency anemia, unspecified: Secondary | ICD-10-CM | POA: Diagnosis not present

## 2020-09-03 DIAGNOSIS — N186 End stage renal disease: Secondary | ICD-10-CM | POA: Diagnosis not present

## 2020-09-03 DIAGNOSIS — N2581 Secondary hyperparathyroidism of renal origin: Secondary | ICD-10-CM | POA: Diagnosis not present

## 2020-09-03 DIAGNOSIS — Z992 Dependence on renal dialysis: Secondary | ICD-10-CM | POA: Diagnosis not present

## 2020-09-05 DIAGNOSIS — N2581 Secondary hyperparathyroidism of renal origin: Secondary | ICD-10-CM | POA: Diagnosis not present

## 2020-09-05 DIAGNOSIS — D689 Coagulation defect, unspecified: Secondary | ICD-10-CM | POA: Diagnosis not present

## 2020-09-05 DIAGNOSIS — N186 End stage renal disease: Secondary | ICD-10-CM | POA: Diagnosis not present

## 2020-09-05 DIAGNOSIS — Z992 Dependence on renal dialysis: Secondary | ICD-10-CM | POA: Diagnosis not present

## 2020-09-05 DIAGNOSIS — D509 Iron deficiency anemia, unspecified: Secondary | ICD-10-CM | POA: Diagnosis not present

## 2020-09-07 DIAGNOSIS — Z992 Dependence on renal dialysis: Secondary | ICD-10-CM | POA: Diagnosis not present

## 2020-09-07 DIAGNOSIS — D689 Coagulation defect, unspecified: Secondary | ICD-10-CM | POA: Diagnosis not present

## 2020-09-07 DIAGNOSIS — N186 End stage renal disease: Secondary | ICD-10-CM | POA: Diagnosis not present

## 2020-09-07 DIAGNOSIS — N2581 Secondary hyperparathyroidism of renal origin: Secondary | ICD-10-CM | POA: Diagnosis not present

## 2020-09-07 DIAGNOSIS — R0602 Shortness of breath: Secondary | ICD-10-CM | POA: Diagnosis not present

## 2020-09-07 DIAGNOSIS — D509 Iron deficiency anemia, unspecified: Secondary | ICD-10-CM | POA: Diagnosis not present

## 2020-09-10 DIAGNOSIS — N2581 Secondary hyperparathyroidism of renal origin: Secondary | ICD-10-CM | POA: Diagnosis not present

## 2020-09-10 DIAGNOSIS — D509 Iron deficiency anemia, unspecified: Secondary | ICD-10-CM | POA: Diagnosis not present

## 2020-09-10 DIAGNOSIS — Z992 Dependence on renal dialysis: Secondary | ICD-10-CM | POA: Diagnosis not present

## 2020-09-10 DIAGNOSIS — N186 End stage renal disease: Secondary | ICD-10-CM | POA: Diagnosis not present

## 2020-09-10 DIAGNOSIS — D689 Coagulation defect, unspecified: Secondary | ICD-10-CM | POA: Diagnosis not present

## 2020-09-10 DIAGNOSIS — L299 Pruritus, unspecified: Secondary | ICD-10-CM | POA: Diagnosis not present

## 2020-09-11 ENCOUNTER — Telehealth: Payer: Medicare HMO | Admitting: Family Medicine

## 2020-09-11 ENCOUNTER — Telehealth: Payer: Self-pay

## 2020-09-11 ENCOUNTER — Other Ambulatory Visit: Payer: Self-pay

## 2020-09-11 NOTE — Telephone Encounter (Signed)
Patient had a video visit appt today. Patient was called 4 times and message was left once on vm. Patient did not return call to accept visit.

## 2020-09-13 ENCOUNTER — Encounter: Payer: Self-pay | Admitting: Family Medicine

## 2020-09-13 ENCOUNTER — Telehealth (INDEPENDENT_AMBULATORY_CARE_PROVIDER_SITE_OTHER): Payer: Medicare HMO | Admitting: Family Medicine

## 2020-09-13 DIAGNOSIS — J9611 Chronic respiratory failure with hypoxia: Secondary | ICD-10-CM | POA: Diagnosis not present

## 2020-09-13 MED ORDER — NITROGLYCERIN 0.4 MG SL SUBL: 0.4000 mg | SUBLINGUAL_TABLET | SUBLINGUAL | 1 refills | Status: DC | PRN

## 2020-09-13 NOTE — Progress Notes (Signed)
I connected with  [name] [name] on 09/13/20 by a video enabled telemedicine application and verified that I am speaking with the correct person using two identifiers.   I discussed the limitations of evaluation and management by telemedicine. The patient expressed understanding and agreed to proceed.

## 2020-09-13 NOTE — Progress Notes (Signed)
Virtual Visit via Video   I connected with patient on 09/13/20 at  1:00 PM EDT by a video enabled telemedicine application and verified that I am speaking with the correct person using two identifiers.  Location patient: Home Location provider: [facility], Office Persons participating in the virtual visit: Patient, Provider, [place] (Sabrina M)  I discussed the limitations of evaluation and management by telemedicine and the availability of in person appointments. The patient expressed understanding and agreed to proceed.  Subjective:   HPI:   Hypoxia- pt reports O2 is 70% when she wakes in the morning.  She has shortness of breath with ambulating.  Also has SOB w/ talking.  Pt reports this has been 'going on awhile.  But I haven't done anything about it'.  Has O2 tank at home.  Has seen Pulmonary and was to f/u w/ Dr Ander Slade.  Pt never had PFTs or high res CT scan as planned.  Pt has used [facility] for her home O2 tank.  ROS:   See pertinent positives and negatives per HPI.  Patient Active Problem List   Diagnosis Date Noted   Gout 07/17/2020   Chronic pain disorder 06/11/2020   Depression with anxiety 30/11/2328   Complication of vascular dialysis catheter 04/13/2020   Shortness of breath 01/05/2020   Cough 01/05/2020   Abnormal findings on diagnostic imaging of lung 01/05/2020   Chronic respiratory failure with hypoxia ([place]) 01/05/2020   Peripheral neuropathy 09/22/2019   Tumor 07/28/2019   History of seizures 07/28/2019   Tremor 07/28/2019   Stress incontinence 12/24/2017   Cough variant asthma 12/24/2017   Tremor of left hand 12/24/2017   History of internal jugular thrombosis 03/26/2017   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   Anemia 07/08/2016   Abnormal brain MRI 07/03/2016   Fasciculation 06/18/2016   Carotid artery disease ([place])    Encounter for screening for COVID-19 01/28/2016   Hyperlipidemia associated with type 2  diabetes mellitus ([place]) 06/28/2015   Diabetes mellitus type II, controlled ([place]) 06/28/2015   Thoracic ascending aortic aneurysm (41 mm on Echo 06/2013) 07/06/2013   ESRD on dialysis ([place]) 06/16/2013   History of renal transplant 06/16/2013   GERD (gastroesophageal reflux disease) 08/13/2009   OTH&UNSPEC NONINFECTIOUS GASTROENTERITIS&COLITIS 08/13/2009   Essential hypertension 04/25/2009   Hemorrhoids 04/25/2009   WEIGHT LOSS 04/25/2009   NAUSEA AND VOMITING 04/25/2009   PERSONAL HX COLONIC POLYPS 04/25/2009    Social History   Tobacco Use   Smoking status: Former    Pack years: 0.00   Smokeless tobacco: Never   Tobacco comments:    smoked in teens  Substance Use Topics   Alcohol use: Not Currently    Current Outpatient Medications:    acetaminophen (TYLENOL) 650 MG CR tablet, Take 650-1,300 mg by mouth every 8 (eight) hours as needed for pain. , Disp: , Rfl:    albuterol (VENTOLIN HFA) 108 (90 Base) MCG/ACT inhaler, INHALE 2 PUFFS INTO THE LUNGS EVERY 4 (FOUR) HOURS AS NEEDED FOR WHEEZING OR SHORTNESS OF BREATH., Disp: 18 g, Rfl: 3   amLODipine (NORVASC) 10 MG tablet, TAKE 1 TABLET BY MOUTH EVERY DAY, Disp: 90 tablet, Rfl: 0   atorvastatin (LIPITOR) 40 MG tablet, Take 1 tablet (40 mg total) by mouth daily. Please make yearly appt with Dr. Acie Fredrickson for April 2022 for future refills. Thank you 1st attempt, Disp: 90 tablet, Rfl: 0   blood glucose meter kit and supplies KIT, Dispense based on patient and insurance preference. Patient should  test sugars twice daily as directed. Dx. E11.9, Disp: 1 each, Rfl: 0   DULoxetine (CYMBALTA) 20 MG capsule, TAKE 1 CAPSULE BY MOUTH EVERY DAY, Disp: 90 capsule, Rfl: 0   ferric citrate (AURYXIA) 1 GM 210 MG(Fe) tablet, Take 210 mg by mouth See admin instructions. Take 210 mg by mouth three times a day with meals and 210 mg two times a day with snacks, Disp: , Rfl:    hydrALAZINE (APRESOLINE) 100 MG tablet, TAKE 1 TABLET BY MOUTH TWICE A DAY, Disp: 180  tablet, Rfl: 0   HYDROcodone-acetaminophen (NORCO/VICODIN) 5-325 MG tablet, Take 1 tablet by mouth every 6 (six) hours as needed for moderate pain., Disp: 30 tablet, Rfl: 0   isosorbide dinitrate (ISORDIL) 20 MG tablet, TAKE 1 TABLET BY MOUTH THREE TIMES A DAY (Patient taking differently: Take 20 mg by mouth 3 (three) times daily.), Disp: 270 tablet, Rfl: 2   losartan (COZAAR) 50 MG tablet, TAKE 1 TABLET BY MOUTH EVERY DAY, Disp: 90 tablet, Rfl: 1   Methoxy PEG-Epoetin Beta (MIRCERA IJ), Mircera, Disp: , Rfl:    multivitamin (RENA-VIT) TABS tablet, Take 1 tablet by mouth every morning. , Disp: , Rfl: 11   nitroGLYCERIN (NITROSTAT) 0.4 MG SL tablet, Place 0.4 mg under the tongue every 5 (five) minutes as needed for chest pain. , Disp: , Rfl:    omeprazole (PRILOSEC) 40 MG capsule, TAKE 1 CAPSULE BY MOUTH EVERY DAY (Patient taking differently: Take 40 mg by mouth daily.), Disp: 90 capsule, Rfl: 1   ONETOUCH ULTRA test strip, USE AS INSTRUCTED TO TEST SUGARS TWICE DAILY. DX. E11.9, Disp: 100 strip, Rfl: 12   QUEtiapine (SEROQUEL) 100 MG tablet, TAKE 1 TABLET BY MOUTH EVERYDAY AT BEDTIME, Disp: 90 tablet, Rfl: 1   QUEtiapine (SEROQUEL) 200 MG tablet, Take 200 mg by mouth 2 (two) times daily., Disp: , Rfl:    traZODone (DESYREL) 50 MG tablet, Take 50 mg by mouth at bedtime as needed for sleep. , Disp: , Rfl:    cetirizine (ZYRTEC) 10 MG tablet, TAKE 1 TABLET BY MOUTH EVERY DAY (Patient not taking: Reported on 09/13/2020), Disp: 90 tablet, Rfl: 3   predniSONE (DELTASONE) 10 MG tablet, Take 2 tablets (20 mg total) by mouth 2 (two) times daily with a meal. (Patient not taking: Reported on 09/13/2020), Disp: 20 tablet, Rfl: 0  Allergies  Allergen Reactions   Lisinopril Shortness Of Breath, Swelling and Other (See Comments)    Throat irritation also. Patient takes losartan and tolerates fine.   Lyrica [Pregabalin] Other (See Comments)    Causing severe myoclonic jerking of UE's in march 2022, dc'd   Daypro  [Oxaprozin] Hives and Dermatitis   Pregabalin Er     Other reaction(s): Jerks    Objective:   There were no vitals taken for this visit. AAOx3, NAD NCAT, EOMI No obvious CN deficits Coloring WNL Pt is able to speak clearly, coherently but w/ some mild SOB Thought process is linear.  Mood is appropriate.   Assessment and Plan:      Annye Asa, MD 09/13/2020

## 2020-09-13 NOTE — Assessment & Plan Note (Signed)
Deteriorated.  Pt feels that she is increasingly short of breath w/ ambulation and even speaking in complete sentences.  She did not have PFTs or high res CT that was mentioned in the pulmonary note from October.  She only has a large home O2 tank at this time and does not have portable O2.  Will try and order portable tank for when she is up and about (she currently uses [place]).  Told her to wear O2 nightly to avoid hypoxia.  Will also reach out to pulmonary to determine next steps.  Pt expressed understanding and is in agreement w/ plan.

## 2020-09-14 DIAGNOSIS — Z992 Dependence on renal dialysis: Secondary | ICD-10-CM | POA: Diagnosis not present

## 2020-09-14 DIAGNOSIS — N2581 Secondary hyperparathyroidism of renal origin: Secondary | ICD-10-CM | POA: Diagnosis not present

## 2020-09-14 DIAGNOSIS — N186 End stage renal disease: Secondary | ICD-10-CM | POA: Diagnosis not present

## 2020-09-14 DIAGNOSIS — D509 Iron deficiency anemia, unspecified: Secondary | ICD-10-CM | POA: Diagnosis not present

## 2020-09-14 DIAGNOSIS — L299 Pruritus, unspecified: Secondary | ICD-10-CM | POA: Diagnosis not present

## 2020-09-14 DIAGNOSIS — D689 Coagulation defect, unspecified: Secondary | ICD-10-CM | POA: Diagnosis not present

## 2020-09-17 DIAGNOSIS — N2581 Secondary hyperparathyroidism of renal origin: Secondary | ICD-10-CM | POA: Diagnosis not present

## 2020-09-17 DIAGNOSIS — Z992 Dependence on renal dialysis: Secondary | ICD-10-CM | POA: Diagnosis not present

## 2020-09-17 DIAGNOSIS — D689 Coagulation defect, unspecified: Secondary | ICD-10-CM | POA: Diagnosis not present

## 2020-09-17 DIAGNOSIS — L299 Pruritus, unspecified: Secondary | ICD-10-CM | POA: Diagnosis not present

## 2020-09-17 DIAGNOSIS — D509 Iron deficiency anemia, unspecified: Secondary | ICD-10-CM | POA: Diagnosis not present

## 2020-09-17 DIAGNOSIS — N186 End stage renal disease: Secondary | ICD-10-CM | POA: Diagnosis not present

## 2020-09-19 ENCOUNTER — Encounter: Payer: Self-pay | Admitting: *Deleted

## 2020-09-19 DIAGNOSIS — N2581 Secondary hyperparathyroidism of renal origin: Secondary | ICD-10-CM | POA: Diagnosis not present

## 2020-09-19 DIAGNOSIS — N186 End stage renal disease: Secondary | ICD-10-CM | POA: Diagnosis not present

## 2020-09-19 DIAGNOSIS — L299 Pruritus, unspecified: Secondary | ICD-10-CM | POA: Diagnosis not present

## 2020-09-19 DIAGNOSIS — D689 Coagulation defect, unspecified: Secondary | ICD-10-CM | POA: Diagnosis not present

## 2020-09-19 DIAGNOSIS — Z992 Dependence on renal dialysis: Secondary | ICD-10-CM | POA: Diagnosis not present

## 2020-09-19 DIAGNOSIS — D509 Iron deficiency anemia, unspecified: Secondary | ICD-10-CM | POA: Diagnosis not present

## 2020-09-20 ENCOUNTER — Telehealth (INDEPENDENT_AMBULATORY_CARE_PROVIDER_SITE_OTHER): Payer: Medicare HMO | Admitting: Family Medicine

## 2020-09-20 ENCOUNTER — Encounter: Payer: Self-pay | Admitting: Family Medicine

## 2020-09-20 DIAGNOSIS — I150 Renovascular hypertension: Secondary | ICD-10-CM | POA: Diagnosis not present

## 2020-09-20 DIAGNOSIS — R040 Epistaxis: Secondary | ICD-10-CM

## 2020-09-20 DIAGNOSIS — N186 End stage renal disease: Secondary | ICD-10-CM | POA: Diagnosis not present

## 2020-09-20 DIAGNOSIS — J019 Acute sinusitis, unspecified: Secondary | ICD-10-CM | POA: Diagnosis not present

## 2020-09-20 DIAGNOSIS — Z992 Dependence on renal dialysis: Secondary | ICD-10-CM | POA: Diagnosis not present

## 2020-09-20 DIAGNOSIS — J9611 Chronic respiratory failure with hypoxia: Secondary | ICD-10-CM

## 2020-09-20 NOTE — Progress Notes (Signed)
I connected with  [name] [name] on 09/20/20 by a video enabled telemedicine application and verified that I am speaking with the correct person using two identifiers.   I discussed the limitations of evaluation and management by telemedicine. The patient expressed understanding and agreed to proceed.

## 2020-09-20 NOTE — Progress Notes (Signed)
Virtual Visit via Video   I connected with patient on 09/20/20 at 11:00 AM EDT by a video enabled telemedicine application and verified that I am speaking with the correct person using two identifiers.  Location patient: Home Location provider: [facility], Office Persons participating in the virtual visit: Patient, Provider, [place] (Sabrina M)  I discussed the limitations of evaluation and management by telemedicine and the availability of in person appointments. The patient expressed understanding and agreed to proceed.  Subjective:   HPI:   Nasal congestion- pt was told by nephrologist at HD to get ENT referral due to congestion and nose bleeds.  Pt wears O2 regularly.  Pt reports 'my face is puffy'.  She thought HD would improve the puffiness but it has not.  Sxs present x10 days.  Chronic Respiratory Failure- pt has not heard anything from [facility] regarding oxygen.  ROS:   See pertinent positives and negatives per HPI.  Patient Active Problem List   Diagnosis Date Noted   Gout 07/17/2020   Chronic pain disorder 06/11/2020   Depression with anxiety 16/12/9602   Complication of vascular dialysis catheter 04/13/2020   Shortness of breath 01/05/2020   Cough 01/05/2020   Abnormal findings on diagnostic imaging of lung 01/05/2020   Chronic respiratory failure with hypoxia ([place]) 01/05/2020   Peripheral neuropathy 09/22/2019   Tumor 07/28/2019   History of seizures 07/28/2019   Tremor 07/28/2019   Stress incontinence 12/24/2017   Cough variant asthma 12/24/2017   Tremor of left hand 12/24/2017   History of internal jugular thrombosis 03/26/2017   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   Anemia 07/08/2016   Abnormal brain MRI 07/03/2016   Fasciculation 06/18/2016   Carotid artery disease ([place])    Encounter for screening for COVID-19 01/28/2016   Hyperlipidemia associated with type 2 diabetes mellitus ([place]) 06/28/2015   Diabetes mellitus  type II, controlled ([place]) 06/28/2015   Thoracic ascending aortic aneurysm (41 mm on Echo 06/2013) 07/06/2013   ESRD on dialysis ([place]) 06/16/2013   History of renal transplant 06/16/2013   GERD (gastroesophageal reflux disease) 08/13/2009   OTH&UNSPEC NONINFECTIOUS GASTROENTERITIS&COLITIS 08/13/2009   Essential hypertension 04/25/2009   Hemorrhoids 04/25/2009   WEIGHT LOSS 04/25/2009   NAUSEA AND VOMITING 04/25/2009   PERSONAL HX COLONIC POLYPS 04/25/2009    Social History   Tobacco Use   Smoking status: Former    Pack years: 0.00   Smokeless tobacco: Never   Tobacco comments:    smoked in teens  Substance Use Topics   Alcohol use: Not Currently    Current Outpatient Medications:    acetaminophen (TYLENOL) 650 MG CR tablet, Take 650-1,300 mg by mouth every 8 (eight) hours as needed for pain. , Disp: , Rfl:    albuterol (VENTOLIN HFA) 108 (90 Base) MCG/ACT inhaler, INHALE 2 PUFFS INTO THE LUNGS EVERY 4 (FOUR) HOURS AS NEEDED FOR WHEEZING OR SHORTNESS OF BREATH., Disp: 18 g, Rfl: 3   amLODipine (NORVASC) 10 MG tablet, TAKE 1 TABLET BY MOUTH EVERY DAY, Disp: 90 tablet, Rfl: 0   atorvastatin (LIPITOR) 40 MG tablet, Take 1 tablet (40 mg total) by mouth daily. Please make yearly appt with Dr. Acie Fredrickson for April 2022 for future refills. Thank you 1st attempt, Disp: 90 tablet, Rfl: 0   blood glucose meter kit and supplies KIT, Dispense based on patient and insurance preference. Patient should test sugars twice daily as directed. Dx. E11.9, Disp: 1 each, Rfl: 0   DULoxetine (CYMBALTA) 20 MG capsule, TAKE 1  CAPSULE BY MOUTH EVERY DAY, Disp: 90 capsule, Rfl: 0   ferric citrate (AURYXIA) 1 GM 210 MG(Fe) tablet, Take 210 mg by mouth See admin instructions. Take 210 mg by mouth three times a day with meals and 210 mg two times a day with snacks, Disp: , Rfl:    hydrALAZINE (APRESOLINE) 100 MG tablet, TAKE 1 TABLET BY MOUTH TWICE A DAY, Disp: 180 tablet, Rfl: 0   HYDROcodone-acetaminophen  (NORCO/VICODIN) 5-325 MG tablet, Take 1 tablet by mouth every 6 (six) hours as needed for moderate pain., Disp: 30 tablet, Rfl: 0   isosorbide dinitrate (ISORDIL) 20 MG tablet, TAKE 1 TABLET BY MOUTH THREE TIMES A DAY (Patient taking differently: Take 20 mg by mouth 3 (three) times daily.), Disp: 270 tablet, Rfl: 2   losartan (COZAAR) 50 MG tablet, TAKE 1 TABLET BY MOUTH EVERY DAY, Disp: 90 tablet, Rfl: 1   Methoxy PEG-Epoetin Beta (MIRCERA IJ), Mircera, Disp: , Rfl:    multivitamin (RENA-VIT) TABS tablet, Take 1 tablet by mouth every morning. , Disp: , Rfl: 11   nitroGLYCERIN (NITROSTAT) 0.4 MG SL tablet, Place 1 tablet (0.4 mg total) under the tongue every 5 (five) minutes as needed for chest pain., Disp: 30 tablet, Rfl: 1   omeprazole (PRILOSEC) 40 MG capsule, TAKE 1 CAPSULE BY MOUTH EVERY DAY (Patient taking differently: Take 40 mg by mouth daily.), Disp: 90 capsule, Rfl: 1   ONETOUCH ULTRA test strip, USE AS INSTRUCTED TO TEST SUGARS TWICE DAILY. DX. E11.9, Disp: 100 strip, Rfl: 12   QUEtiapine (SEROQUEL) 100 MG tablet, TAKE 1 TABLET BY MOUTH EVERYDAY AT BEDTIME, Disp: 90 tablet, Rfl: 1   QUEtiapine (SEROQUEL) 200 MG tablet, Take 200 mg by mouth 2 (two) times daily., Disp: , Rfl:    traZODone (DESYREL) 50 MG tablet, Take 50 mg by mouth at bedtime as needed for sleep. , Disp: , Rfl:   Allergies  Allergen Reactions   Lisinopril Shortness Of Breath, Swelling and Other (See Comments)    Throat irritation also. Patient takes losartan and tolerates fine.   Lyrica [Pregabalin] Other (See Comments)    Causing severe myoclonic jerking of UE's in march 2022, dc'd   Daypro [Oxaprozin] Hives and Dermatitis   Pregabalin Er     Other reaction(s): Jerks    Objective:   There were no vitals taken for this visit. AAOx3, NAD NCAT, EOMI No obvious CN deficits or facial swelling Pt is able to speak clearly, coherently without shortness of breath or increased work of breathing.  Thought process is  linear.  Mood is appropriate.   Assessment and Plan:   Sinusitis/epistaxis- new.  Pt reports Dr Justin Mend was concerned when he saw her at HD and recommended an ENT referral for facial swelling and congestion.  Pt denies fever, pain.  She has recently had to increase her O2 usage so wonder if her sxs are related to her nasal cannula.  Referral placed.  Chronic respiratory failure- pt's previous Pulmonary appt was closed since she is an established pt.  Re-referred b/c pt has trouble scheduling her own appts and needs the office to reach out.  [facility] referral has also not been completed b/c we are having a hard time finding someone who will take her insurance.  Will continue to work on this w/ Teaching laboratory technician.   Annye Asa, MD 09/20/2020

## 2020-09-21 DIAGNOSIS — Z992 Dependence on renal dialysis: Secondary | ICD-10-CM | POA: Diagnosis not present

## 2020-09-21 DIAGNOSIS — N186 End stage renal disease: Secondary | ICD-10-CM | POA: Diagnosis not present

## 2020-09-21 DIAGNOSIS — D689 Coagulation defect, unspecified: Secondary | ICD-10-CM | POA: Diagnosis not present

## 2020-09-21 DIAGNOSIS — N2581 Secondary hyperparathyroidism of renal origin: Secondary | ICD-10-CM | POA: Diagnosis not present

## 2020-09-21 DIAGNOSIS — R0602 Shortness of breath: Secondary | ICD-10-CM | POA: Diagnosis not present

## 2020-09-24 DIAGNOSIS — N186 End stage renal disease: Secondary | ICD-10-CM | POA: Diagnosis not present

## 2020-09-24 DIAGNOSIS — D689 Coagulation defect, unspecified: Secondary | ICD-10-CM | POA: Diagnosis not present

## 2020-09-24 DIAGNOSIS — N2581 Secondary hyperparathyroidism of renal origin: Secondary | ICD-10-CM | POA: Diagnosis not present

## 2020-09-24 DIAGNOSIS — Z992 Dependence on renal dialysis: Secondary | ICD-10-CM | POA: Diagnosis not present

## 2020-09-24 DIAGNOSIS — L299 Pruritus, unspecified: Secondary | ICD-10-CM | POA: Diagnosis not present

## 2020-09-24 DIAGNOSIS — D509 Iron deficiency anemia, unspecified: Secondary | ICD-10-CM | POA: Diagnosis not present

## 2020-09-26 DIAGNOSIS — D689 Coagulation defect, unspecified: Secondary | ICD-10-CM | POA: Diagnosis not present

## 2020-09-26 DIAGNOSIS — N186 End stage renal disease: Secondary | ICD-10-CM | POA: Diagnosis not present

## 2020-09-26 DIAGNOSIS — L299 Pruritus, unspecified: Secondary | ICD-10-CM | POA: Diagnosis not present

## 2020-09-26 DIAGNOSIS — N2581 Secondary hyperparathyroidism of renal origin: Secondary | ICD-10-CM | POA: Diagnosis not present

## 2020-09-26 DIAGNOSIS — Z992 Dependence on renal dialysis: Secondary | ICD-10-CM | POA: Diagnosis not present

## 2020-09-26 DIAGNOSIS — D509 Iron deficiency anemia, unspecified: Secondary | ICD-10-CM | POA: Diagnosis not present

## 2020-09-28 DIAGNOSIS — L299 Pruritus, unspecified: Secondary | ICD-10-CM | POA: Diagnosis not present

## 2020-09-28 DIAGNOSIS — D509 Iron deficiency anemia, unspecified: Secondary | ICD-10-CM | POA: Diagnosis not present

## 2020-09-28 DIAGNOSIS — N2581 Secondary hyperparathyroidism of renal origin: Secondary | ICD-10-CM | POA: Diagnosis not present

## 2020-09-28 DIAGNOSIS — Z992 Dependence on renal dialysis: Secondary | ICD-10-CM | POA: Diagnosis not present

## 2020-09-28 DIAGNOSIS — D689 Coagulation defect, unspecified: Secondary | ICD-10-CM | POA: Diagnosis not present

## 2020-09-28 DIAGNOSIS — N186 End stage renal disease: Secondary | ICD-10-CM | POA: Diagnosis not present

## 2020-10-01 DIAGNOSIS — N2581 Secondary hyperparathyroidism of renal origin: Secondary | ICD-10-CM | POA: Diagnosis not present

## 2020-10-01 DIAGNOSIS — D509 Iron deficiency anemia, unspecified: Secondary | ICD-10-CM | POA: Diagnosis not present

## 2020-10-01 DIAGNOSIS — D689 Coagulation defect, unspecified: Secondary | ICD-10-CM | POA: Diagnosis not present

## 2020-10-01 DIAGNOSIS — N186 End stage renal disease: Secondary | ICD-10-CM | POA: Diagnosis not present

## 2020-10-01 DIAGNOSIS — Z992 Dependence on renal dialysis: Secondary | ICD-10-CM | POA: Diagnosis not present

## 2020-10-02 ENCOUNTER — Ambulatory Visit: Payer: Medicare HMO

## 2020-10-03 ENCOUNTER — Other Ambulatory Visit: Payer: Self-pay

## 2020-10-03 DIAGNOSIS — Z992 Dependence on renal dialysis: Secondary | ICD-10-CM | POA: Diagnosis not present

## 2020-10-03 DIAGNOSIS — D509 Iron deficiency anemia, unspecified: Secondary | ICD-10-CM | POA: Diagnosis not present

## 2020-10-03 DIAGNOSIS — R0902 Hypoxemia: Secondary | ICD-10-CM

## 2020-10-03 DIAGNOSIS — N186 End stage renal disease: Secondary | ICD-10-CM | POA: Diagnosis not present

## 2020-10-03 DIAGNOSIS — N2581 Secondary hyperparathyroidism of renal origin: Secondary | ICD-10-CM | POA: Diagnosis not present

## 2020-10-03 DIAGNOSIS — D689 Coagulation defect, unspecified: Secondary | ICD-10-CM | POA: Diagnosis not present

## 2020-10-05 DIAGNOSIS — D689 Coagulation defect, unspecified: Secondary | ICD-10-CM | POA: Diagnosis not present

## 2020-10-05 DIAGNOSIS — N186 End stage renal disease: Secondary | ICD-10-CM | POA: Diagnosis not present

## 2020-10-05 DIAGNOSIS — D509 Iron deficiency anemia, unspecified: Secondary | ICD-10-CM | POA: Diagnosis not present

## 2020-10-05 DIAGNOSIS — N2581 Secondary hyperparathyroidism of renal origin: Secondary | ICD-10-CM | POA: Diagnosis not present

## 2020-10-05 DIAGNOSIS — Z992 Dependence on renal dialysis: Secondary | ICD-10-CM | POA: Diagnosis not present

## 2020-10-07 DIAGNOSIS — R0602 Shortness of breath: Secondary | ICD-10-CM | POA: Diagnosis not present

## 2020-10-08 DIAGNOSIS — N186 End stage renal disease: Secondary | ICD-10-CM | POA: Diagnosis not present

## 2020-10-08 DIAGNOSIS — E1129 Type 2 diabetes mellitus with other diabetic kidney complication: Secondary | ICD-10-CM | POA: Diagnosis not present

## 2020-10-08 DIAGNOSIS — D509 Iron deficiency anemia, unspecified: Secondary | ICD-10-CM | POA: Diagnosis not present

## 2020-10-08 DIAGNOSIS — N2581 Secondary hyperparathyroidism of renal origin: Secondary | ICD-10-CM | POA: Diagnosis not present

## 2020-10-08 DIAGNOSIS — Z992 Dependence on renal dialysis: Secondary | ICD-10-CM | POA: Diagnosis not present

## 2020-10-08 DIAGNOSIS — D689 Coagulation defect, unspecified: Secondary | ICD-10-CM | POA: Diagnosis not present

## 2020-10-10 DIAGNOSIS — N2581 Secondary hyperparathyroidism of renal origin: Secondary | ICD-10-CM | POA: Diagnosis not present

## 2020-10-10 DIAGNOSIS — N186 End stage renal disease: Secondary | ICD-10-CM | POA: Diagnosis not present

## 2020-10-10 DIAGNOSIS — Z992 Dependence on renal dialysis: Secondary | ICD-10-CM | POA: Diagnosis not present

## 2020-10-10 DIAGNOSIS — D689 Coagulation defect, unspecified: Secondary | ICD-10-CM | POA: Diagnosis not present

## 2020-10-10 DIAGNOSIS — E1129 Type 2 diabetes mellitus with other diabetic kidney complication: Secondary | ICD-10-CM | POA: Diagnosis not present

## 2020-10-10 DIAGNOSIS — D509 Iron deficiency anemia, unspecified: Secondary | ICD-10-CM | POA: Diagnosis not present

## 2020-10-12 DIAGNOSIS — D509 Iron deficiency anemia, unspecified: Secondary | ICD-10-CM | POA: Diagnosis not present

## 2020-10-12 DIAGNOSIS — D689 Coagulation defect, unspecified: Secondary | ICD-10-CM | POA: Diagnosis not present

## 2020-10-12 DIAGNOSIS — N186 End stage renal disease: Secondary | ICD-10-CM | POA: Diagnosis not present

## 2020-10-12 DIAGNOSIS — N2581 Secondary hyperparathyroidism of renal origin: Secondary | ICD-10-CM | POA: Diagnosis not present

## 2020-10-12 DIAGNOSIS — E1129 Type 2 diabetes mellitus with other diabetic kidney complication: Secondary | ICD-10-CM | POA: Diagnosis not present

## 2020-10-12 DIAGNOSIS — Z992 Dependence on renal dialysis: Secondary | ICD-10-CM | POA: Diagnosis not present

## 2020-10-15 DIAGNOSIS — N2581 Secondary hyperparathyroidism of renal origin: Secondary | ICD-10-CM | POA: Diagnosis not present

## 2020-10-15 DIAGNOSIS — D509 Iron deficiency anemia, unspecified: Secondary | ICD-10-CM | POA: Diagnosis not present

## 2020-10-15 DIAGNOSIS — L299 Pruritus, unspecified: Secondary | ICD-10-CM | POA: Diagnosis not present

## 2020-10-15 DIAGNOSIS — D689 Coagulation defect, unspecified: Secondary | ICD-10-CM | POA: Diagnosis not present

## 2020-10-15 DIAGNOSIS — Z992 Dependence on renal dialysis: Secondary | ICD-10-CM | POA: Diagnosis not present

## 2020-10-15 DIAGNOSIS — N186 End stage renal disease: Secondary | ICD-10-CM | POA: Diagnosis not present

## 2020-10-17 DIAGNOSIS — N2581 Secondary hyperparathyroidism of renal origin: Secondary | ICD-10-CM | POA: Diagnosis not present

## 2020-10-17 DIAGNOSIS — Z992 Dependence on renal dialysis: Secondary | ICD-10-CM | POA: Diagnosis not present

## 2020-10-17 DIAGNOSIS — D689 Coagulation defect, unspecified: Secondary | ICD-10-CM | POA: Diagnosis not present

## 2020-10-17 DIAGNOSIS — D509 Iron deficiency anemia, unspecified: Secondary | ICD-10-CM | POA: Diagnosis not present

## 2020-10-17 DIAGNOSIS — L299 Pruritus, unspecified: Secondary | ICD-10-CM | POA: Diagnosis not present

## 2020-10-17 DIAGNOSIS — N186 End stage renal disease: Secondary | ICD-10-CM | POA: Diagnosis not present

## 2020-10-19 DIAGNOSIS — Z992 Dependence on renal dialysis: Secondary | ICD-10-CM | POA: Diagnosis not present

## 2020-10-19 DIAGNOSIS — D689 Coagulation defect, unspecified: Secondary | ICD-10-CM | POA: Diagnosis not present

## 2020-10-19 DIAGNOSIS — L299 Pruritus, unspecified: Secondary | ICD-10-CM | POA: Diagnosis not present

## 2020-10-19 DIAGNOSIS — D509 Iron deficiency anemia, unspecified: Secondary | ICD-10-CM | POA: Diagnosis not present

## 2020-10-19 DIAGNOSIS — N186 End stage renal disease: Secondary | ICD-10-CM | POA: Diagnosis not present

## 2020-10-19 DIAGNOSIS — N2581 Secondary hyperparathyroidism of renal origin: Secondary | ICD-10-CM | POA: Diagnosis not present

## 2020-10-21 DIAGNOSIS — Z992 Dependence on renal dialysis: Secondary | ICD-10-CM | POA: Diagnosis not present

## 2020-10-21 DIAGNOSIS — N186 End stage renal disease: Secondary | ICD-10-CM | POA: Diagnosis not present

## 2020-10-21 DIAGNOSIS — I150 Renovascular hypertension: Secondary | ICD-10-CM | POA: Diagnosis not present

## 2020-10-22 DIAGNOSIS — D509 Iron deficiency anemia, unspecified: Secondary | ICD-10-CM | POA: Diagnosis not present

## 2020-10-22 DIAGNOSIS — D689 Coagulation defect, unspecified: Secondary | ICD-10-CM | POA: Diagnosis not present

## 2020-10-22 DIAGNOSIS — L299 Pruritus, unspecified: Secondary | ICD-10-CM | POA: Diagnosis not present

## 2020-10-22 DIAGNOSIS — N2581 Secondary hyperparathyroidism of renal origin: Secondary | ICD-10-CM | POA: Diagnosis not present

## 2020-10-22 DIAGNOSIS — R0602 Shortness of breath: Secondary | ICD-10-CM | POA: Diagnosis not present

## 2020-10-22 DIAGNOSIS — N186 End stage renal disease: Secondary | ICD-10-CM | POA: Diagnosis not present

## 2020-10-22 DIAGNOSIS — Z992 Dependence on renal dialysis: Secondary | ICD-10-CM | POA: Diagnosis not present

## 2020-10-24 DIAGNOSIS — D689 Coagulation defect, unspecified: Secondary | ICD-10-CM | POA: Diagnosis not present

## 2020-10-24 DIAGNOSIS — L299 Pruritus, unspecified: Secondary | ICD-10-CM | POA: Diagnosis not present

## 2020-10-24 DIAGNOSIS — Z992 Dependence on renal dialysis: Secondary | ICD-10-CM | POA: Diagnosis not present

## 2020-10-24 DIAGNOSIS — N186 End stage renal disease: Secondary | ICD-10-CM | POA: Diagnosis not present

## 2020-10-24 DIAGNOSIS — N2581 Secondary hyperparathyroidism of renal origin: Secondary | ICD-10-CM | POA: Diagnosis not present

## 2020-10-24 DIAGNOSIS — D509 Iron deficiency anemia, unspecified: Secondary | ICD-10-CM | POA: Diagnosis not present

## 2020-10-26 ENCOUNTER — Other Ambulatory Visit: Payer: Self-pay | Admitting: Family Medicine

## 2020-10-26 DIAGNOSIS — L299 Pruritus, unspecified: Secondary | ICD-10-CM | POA: Diagnosis not present

## 2020-10-26 DIAGNOSIS — Z992 Dependence on renal dialysis: Secondary | ICD-10-CM | POA: Diagnosis not present

## 2020-10-26 DIAGNOSIS — I10 Essential (primary) hypertension: Secondary | ICD-10-CM

## 2020-10-26 DIAGNOSIS — D689 Coagulation defect, unspecified: Secondary | ICD-10-CM | POA: Diagnosis not present

## 2020-10-26 DIAGNOSIS — N186 End stage renal disease: Secondary | ICD-10-CM | POA: Diagnosis not present

## 2020-10-26 DIAGNOSIS — D509 Iron deficiency anemia, unspecified: Secondary | ICD-10-CM | POA: Diagnosis not present

## 2020-10-26 DIAGNOSIS — N2581 Secondary hyperparathyroidism of renal origin: Secondary | ICD-10-CM | POA: Diagnosis not present

## 2020-10-29 DIAGNOSIS — D631 Anemia in chronic kidney disease: Secondary | ICD-10-CM | POA: Diagnosis not present

## 2020-10-29 DIAGNOSIS — D689 Coagulation defect, unspecified: Secondary | ICD-10-CM | POA: Diagnosis not present

## 2020-10-29 DIAGNOSIS — N2581 Secondary hyperparathyroidism of renal origin: Secondary | ICD-10-CM | POA: Diagnosis not present

## 2020-10-29 DIAGNOSIS — Z992 Dependence on renal dialysis: Secondary | ICD-10-CM | POA: Diagnosis not present

## 2020-10-29 DIAGNOSIS — L299 Pruritus, unspecified: Secondary | ICD-10-CM | POA: Diagnosis not present

## 2020-10-29 DIAGNOSIS — D509 Iron deficiency anemia, unspecified: Secondary | ICD-10-CM | POA: Diagnosis not present

## 2020-10-29 DIAGNOSIS — N186 End stage renal disease: Secondary | ICD-10-CM | POA: Diagnosis not present

## 2020-10-31 DIAGNOSIS — D689 Coagulation defect, unspecified: Secondary | ICD-10-CM | POA: Diagnosis not present

## 2020-10-31 DIAGNOSIS — D631 Anemia in chronic kidney disease: Secondary | ICD-10-CM | POA: Diagnosis not present

## 2020-10-31 DIAGNOSIS — D509 Iron deficiency anemia, unspecified: Secondary | ICD-10-CM | POA: Diagnosis not present

## 2020-10-31 DIAGNOSIS — N186 End stage renal disease: Secondary | ICD-10-CM | POA: Diagnosis not present

## 2020-10-31 DIAGNOSIS — N2581 Secondary hyperparathyroidism of renal origin: Secondary | ICD-10-CM | POA: Diagnosis not present

## 2020-10-31 DIAGNOSIS — Z992 Dependence on renal dialysis: Secondary | ICD-10-CM | POA: Diagnosis not present

## 2020-10-31 DIAGNOSIS — L299 Pruritus, unspecified: Secondary | ICD-10-CM | POA: Diagnosis not present

## 2020-11-02 DIAGNOSIS — D631 Anemia in chronic kidney disease: Secondary | ICD-10-CM | POA: Diagnosis not present

## 2020-11-02 DIAGNOSIS — D689 Coagulation defect, unspecified: Secondary | ICD-10-CM | POA: Diagnosis not present

## 2020-11-02 DIAGNOSIS — L299 Pruritus, unspecified: Secondary | ICD-10-CM | POA: Diagnosis not present

## 2020-11-02 DIAGNOSIS — N186 End stage renal disease: Secondary | ICD-10-CM | POA: Diagnosis not present

## 2020-11-02 DIAGNOSIS — N2581 Secondary hyperparathyroidism of renal origin: Secondary | ICD-10-CM | POA: Diagnosis not present

## 2020-11-02 DIAGNOSIS — Z992 Dependence on renal dialysis: Secondary | ICD-10-CM | POA: Diagnosis not present

## 2020-11-02 DIAGNOSIS — D509 Iron deficiency anemia, unspecified: Secondary | ICD-10-CM | POA: Diagnosis not present

## 2020-11-05 DIAGNOSIS — D509 Iron deficiency anemia, unspecified: Secondary | ICD-10-CM | POA: Diagnosis not present

## 2020-11-05 DIAGNOSIS — N2581 Secondary hyperparathyroidism of renal origin: Secondary | ICD-10-CM | POA: Diagnosis not present

## 2020-11-05 DIAGNOSIS — Z992 Dependence on renal dialysis: Secondary | ICD-10-CM | POA: Diagnosis not present

## 2020-11-05 DIAGNOSIS — N186 End stage renal disease: Secondary | ICD-10-CM | POA: Diagnosis not present

## 2020-11-05 DIAGNOSIS — D689 Coagulation defect, unspecified: Secondary | ICD-10-CM | POA: Diagnosis not present

## 2020-11-07 DIAGNOSIS — N2581 Secondary hyperparathyroidism of renal origin: Secondary | ICD-10-CM | POA: Diagnosis not present

## 2020-11-07 DIAGNOSIS — R0602 Shortness of breath: Secondary | ICD-10-CM | POA: Diagnosis not present

## 2020-11-07 DIAGNOSIS — N186 End stage renal disease: Secondary | ICD-10-CM | POA: Diagnosis not present

## 2020-11-07 DIAGNOSIS — D689 Coagulation defect, unspecified: Secondary | ICD-10-CM | POA: Diagnosis not present

## 2020-11-07 DIAGNOSIS — D509 Iron deficiency anemia, unspecified: Secondary | ICD-10-CM | POA: Diagnosis not present

## 2020-11-07 DIAGNOSIS — Z992 Dependence on renal dialysis: Secondary | ICD-10-CM | POA: Diagnosis not present

## 2020-11-09 DIAGNOSIS — D509 Iron deficiency anemia, unspecified: Secondary | ICD-10-CM | POA: Diagnosis not present

## 2020-11-09 DIAGNOSIS — Z992 Dependence on renal dialysis: Secondary | ICD-10-CM | POA: Diagnosis not present

## 2020-11-09 DIAGNOSIS — N186 End stage renal disease: Secondary | ICD-10-CM | POA: Diagnosis not present

## 2020-11-09 DIAGNOSIS — D689 Coagulation defect, unspecified: Secondary | ICD-10-CM | POA: Diagnosis not present

## 2020-11-09 DIAGNOSIS — N2581 Secondary hyperparathyroidism of renal origin: Secondary | ICD-10-CM | POA: Diagnosis not present

## 2020-11-10 ENCOUNTER — Other Ambulatory Visit: Payer: Self-pay | Admitting: Family Medicine

## 2020-11-12 DIAGNOSIS — D631 Anemia in chronic kidney disease: Secondary | ICD-10-CM | POA: Diagnosis not present

## 2020-11-12 DIAGNOSIS — Z992 Dependence on renal dialysis: Secondary | ICD-10-CM | POA: Diagnosis not present

## 2020-11-12 DIAGNOSIS — D689 Coagulation defect, unspecified: Secondary | ICD-10-CM | POA: Diagnosis not present

## 2020-11-12 DIAGNOSIS — L299 Pruritus, unspecified: Secondary | ICD-10-CM | POA: Diagnosis not present

## 2020-11-12 DIAGNOSIS — N2581 Secondary hyperparathyroidism of renal origin: Secondary | ICD-10-CM | POA: Diagnosis not present

## 2020-11-12 DIAGNOSIS — N186 End stage renal disease: Secondary | ICD-10-CM | POA: Diagnosis not present

## 2020-11-12 DIAGNOSIS — D509 Iron deficiency anemia, unspecified: Secondary | ICD-10-CM | POA: Diagnosis not present

## 2020-11-12 NOTE — Telephone Encounter (Signed)
Quetiapine 100mg  LFD 07/30/20 #90 with 1 refill Quetiapine 200mg , LFD 06/01/20 by a historical provider [place] 09/20/20 NO/v none

## 2020-11-14 DIAGNOSIS — D631 Anemia in chronic kidney disease: Secondary | ICD-10-CM | POA: Diagnosis not present

## 2020-11-14 DIAGNOSIS — Z992 Dependence on renal dialysis: Secondary | ICD-10-CM | POA: Diagnosis not present

## 2020-11-14 DIAGNOSIS — D689 Coagulation defect, unspecified: Secondary | ICD-10-CM | POA: Diagnosis not present

## 2020-11-14 DIAGNOSIS — D509 Iron deficiency anemia, unspecified: Secondary | ICD-10-CM | POA: Diagnosis not present

## 2020-11-14 DIAGNOSIS — N186 End stage renal disease: Secondary | ICD-10-CM | POA: Diagnosis not present

## 2020-11-14 DIAGNOSIS — N2581 Secondary hyperparathyroidism of renal origin: Secondary | ICD-10-CM | POA: Diagnosis not present

## 2020-11-14 DIAGNOSIS — L299 Pruritus, unspecified: Secondary | ICD-10-CM | POA: Diagnosis not present

## 2020-11-16 DIAGNOSIS — D631 Anemia in chronic kidney disease: Secondary | ICD-10-CM | POA: Diagnosis not present

## 2020-11-16 DIAGNOSIS — Z992 Dependence on renal dialysis: Secondary | ICD-10-CM | POA: Diagnosis not present

## 2020-11-16 DIAGNOSIS — N186 End stage renal disease: Secondary | ICD-10-CM | POA: Diagnosis not present

## 2020-11-16 DIAGNOSIS — D509 Iron deficiency anemia, unspecified: Secondary | ICD-10-CM | POA: Diagnosis not present

## 2020-11-16 DIAGNOSIS — D689 Coagulation defect, unspecified: Secondary | ICD-10-CM | POA: Diagnosis not present

## 2020-11-16 DIAGNOSIS — N2581 Secondary hyperparathyroidism of renal origin: Secondary | ICD-10-CM | POA: Diagnosis not present

## 2020-11-16 DIAGNOSIS — L299 Pruritus, unspecified: Secondary | ICD-10-CM | POA: Diagnosis not present

## 2020-11-19 DIAGNOSIS — D689 Coagulation defect, unspecified: Secondary | ICD-10-CM | POA: Diagnosis not present

## 2020-11-19 DIAGNOSIS — D509 Iron deficiency anemia, unspecified: Secondary | ICD-10-CM | POA: Diagnosis not present

## 2020-11-19 DIAGNOSIS — Z992 Dependence on renal dialysis: Secondary | ICD-10-CM | POA: Diagnosis not present

## 2020-11-19 DIAGNOSIS — L299 Pruritus, unspecified: Secondary | ICD-10-CM | POA: Diagnosis not present

## 2020-11-19 DIAGNOSIS — N186 End stage renal disease: Secondary | ICD-10-CM | POA: Diagnosis not present

## 2020-11-19 DIAGNOSIS — N2581 Secondary hyperparathyroidism of renal origin: Secondary | ICD-10-CM | POA: Diagnosis not present

## 2020-11-21 DIAGNOSIS — D689 Coagulation defect, unspecified: Secondary | ICD-10-CM | POA: Diagnosis not present

## 2020-11-21 DIAGNOSIS — N2581 Secondary hyperparathyroidism of renal origin: Secondary | ICD-10-CM | POA: Diagnosis not present

## 2020-11-21 DIAGNOSIS — D509 Iron deficiency anemia, unspecified: Secondary | ICD-10-CM | POA: Diagnosis not present

## 2020-11-21 DIAGNOSIS — I150 Renovascular hypertension: Secondary | ICD-10-CM | POA: Diagnosis not present

## 2020-11-21 DIAGNOSIS — Z992 Dependence on renal dialysis: Secondary | ICD-10-CM | POA: Diagnosis not present

## 2020-11-21 DIAGNOSIS — L299 Pruritus, unspecified: Secondary | ICD-10-CM | POA: Diagnosis not present

## 2020-11-21 DIAGNOSIS — N186 End stage renal disease: Secondary | ICD-10-CM | POA: Diagnosis not present

## 2020-11-22 DIAGNOSIS — R0602 Shortness of breath: Secondary | ICD-10-CM | POA: Diagnosis not present

## 2020-11-23 DIAGNOSIS — D689 Coagulation defect, unspecified: Secondary | ICD-10-CM | POA: Diagnosis not present

## 2020-11-23 DIAGNOSIS — N2581 Secondary hyperparathyroidism of renal origin: Secondary | ICD-10-CM | POA: Diagnosis not present

## 2020-11-23 DIAGNOSIS — Z992 Dependence on renal dialysis: Secondary | ICD-10-CM | POA: Diagnosis not present

## 2020-11-23 DIAGNOSIS — N186 End stage renal disease: Secondary | ICD-10-CM | POA: Diagnosis not present

## 2020-11-26 DIAGNOSIS — D509 Iron deficiency anemia, unspecified: Secondary | ICD-10-CM | POA: Diagnosis not present

## 2020-11-26 DIAGNOSIS — L299 Pruritus, unspecified: Secondary | ICD-10-CM | POA: Diagnosis not present

## 2020-11-26 DIAGNOSIS — N2581 Secondary hyperparathyroidism of renal origin: Secondary | ICD-10-CM | POA: Diagnosis not present

## 2020-11-26 DIAGNOSIS — D689 Coagulation defect, unspecified: Secondary | ICD-10-CM | POA: Diagnosis not present

## 2020-11-26 DIAGNOSIS — Z992 Dependence on renal dialysis: Secondary | ICD-10-CM | POA: Diagnosis not present

## 2020-11-26 DIAGNOSIS — D631 Anemia in chronic kidney disease: Secondary | ICD-10-CM | POA: Diagnosis not present

## 2020-11-26 DIAGNOSIS — N186 End stage renal disease: Secondary | ICD-10-CM | POA: Diagnosis not present

## 2020-11-28 DIAGNOSIS — L299 Pruritus, unspecified: Secondary | ICD-10-CM | POA: Diagnosis not present

## 2020-11-28 DIAGNOSIS — Z992 Dependence on renal dialysis: Secondary | ICD-10-CM | POA: Diagnosis not present

## 2020-11-28 DIAGNOSIS — N186 End stage renal disease: Secondary | ICD-10-CM | POA: Diagnosis not present

## 2020-11-28 DIAGNOSIS — N2581 Secondary hyperparathyroidism of renal origin: Secondary | ICD-10-CM | POA: Diagnosis not present

## 2020-11-28 DIAGNOSIS — D509 Iron deficiency anemia, unspecified: Secondary | ICD-10-CM | POA: Diagnosis not present

## 2020-11-28 DIAGNOSIS — D631 Anemia in chronic kidney disease: Secondary | ICD-10-CM | POA: Diagnosis not present

## 2020-11-28 DIAGNOSIS — D689 Coagulation defect, unspecified: Secondary | ICD-10-CM | POA: Diagnosis not present

## 2020-11-29 ENCOUNTER — Other Ambulatory Visit: Payer: Self-pay | Admitting: *Deleted

## 2020-11-29 DIAGNOSIS — J849 Interstitial pulmonary disease, unspecified: Secondary | ICD-10-CM

## 2020-11-29 NOTE — Progress Notes (Signed)
HRCT ordered by Aaron Edelman, NP 12/2019 for 1 year follow up 12/2020.  HRCT order placed for Dr. Ander Slade per [facility] request.

## 2020-11-30 DIAGNOSIS — D509 Iron deficiency anemia, unspecified: Secondary | ICD-10-CM | POA: Diagnosis not present

## 2020-11-30 DIAGNOSIS — L299 Pruritus, unspecified: Secondary | ICD-10-CM | POA: Diagnosis not present

## 2020-11-30 DIAGNOSIS — N2581 Secondary hyperparathyroidism of renal origin: Secondary | ICD-10-CM | POA: Diagnosis not present

## 2020-11-30 DIAGNOSIS — D689 Coagulation defect, unspecified: Secondary | ICD-10-CM | POA: Diagnosis not present

## 2020-11-30 DIAGNOSIS — D631 Anemia in chronic kidney disease: Secondary | ICD-10-CM | POA: Diagnosis not present

## 2020-11-30 DIAGNOSIS — N186 End stage renal disease: Secondary | ICD-10-CM | POA: Diagnosis not present

## 2020-11-30 DIAGNOSIS — Z992 Dependence on renal dialysis: Secondary | ICD-10-CM | POA: Diagnosis not present

## 2020-12-03 DIAGNOSIS — D509 Iron deficiency anemia, unspecified: Secondary | ICD-10-CM | POA: Diagnosis not present

## 2020-12-03 DIAGNOSIS — N186 End stage renal disease: Secondary | ICD-10-CM | POA: Diagnosis not present

## 2020-12-03 DIAGNOSIS — L299 Pruritus, unspecified: Secondary | ICD-10-CM | POA: Diagnosis not present

## 2020-12-03 DIAGNOSIS — N2581 Secondary hyperparathyroidism of renal origin: Secondary | ICD-10-CM | POA: Diagnosis not present

## 2020-12-03 DIAGNOSIS — D689 Coagulation defect, unspecified: Secondary | ICD-10-CM | POA: Diagnosis not present

## 2020-12-03 DIAGNOSIS — Z992 Dependence on renal dialysis: Secondary | ICD-10-CM | POA: Diagnosis not present

## 2020-12-05 DIAGNOSIS — D509 Iron deficiency anemia, unspecified: Secondary | ICD-10-CM | POA: Diagnosis not present

## 2020-12-05 DIAGNOSIS — Z992 Dependence on renal dialysis: Secondary | ICD-10-CM | POA: Diagnosis not present

## 2020-12-05 DIAGNOSIS — L299 Pruritus, unspecified: Secondary | ICD-10-CM | POA: Diagnosis not present

## 2020-12-05 DIAGNOSIS — N186 End stage renal disease: Secondary | ICD-10-CM | POA: Diagnosis not present

## 2020-12-05 DIAGNOSIS — D689 Coagulation defect, unspecified: Secondary | ICD-10-CM | POA: Diagnosis not present

## 2020-12-05 DIAGNOSIS — N2581 Secondary hyperparathyroidism of renal origin: Secondary | ICD-10-CM | POA: Diagnosis not present

## 2020-12-07 ENCOUNTER — Telehealth: Payer: Self-pay | Admitting: Family Medicine

## 2020-12-07 DIAGNOSIS — D509 Iron deficiency anemia, unspecified: Secondary | ICD-10-CM | POA: Diagnosis not present

## 2020-12-07 DIAGNOSIS — N186 End stage renal disease: Secondary | ICD-10-CM | POA: Diagnosis not present

## 2020-12-07 DIAGNOSIS — L299 Pruritus, unspecified: Secondary | ICD-10-CM | POA: Diagnosis not present

## 2020-12-07 DIAGNOSIS — Z992 Dependence on renal dialysis: Secondary | ICD-10-CM | POA: Diagnosis not present

## 2020-12-07 DIAGNOSIS — D689 Coagulation defect, unspecified: Secondary | ICD-10-CM | POA: Diagnosis not present

## 2020-12-07 DIAGNOSIS — N2581 Secondary hyperparathyroidism of renal origin: Secondary | ICD-10-CM | POA: Diagnosis not present

## 2020-12-07 NOTE — Telephone Encounter (Signed)
N/A, unable to leave a message for patient to call back and schedule the Medicare Annual Wellness Visit (AWV) virtually or by telephone.  Last AWV 09/27/19  Please schedule at anytime with Nurse Health Advisor.    Any questions, please call me at [phone]

## 2020-12-08 ENCOUNTER — Telehealth: Payer: Self-pay | Admitting: Family Medicine

## 2020-12-08 DIAGNOSIS — R0602 Shortness of breath: Secondary | ICD-10-CM | POA: Diagnosis not present

## 2020-12-08 NOTE — Telephone Encounter (Signed)
Tried calling patient to  schedule Medicare Annual Wellness Visit (AWV) either virtually or in office.  No answer   Last AWV 09/27/19  please schedule at anytime with health coach

## 2020-12-10 DIAGNOSIS — Z992 Dependence on renal dialysis: Secondary | ICD-10-CM | POA: Diagnosis not present

## 2020-12-10 DIAGNOSIS — D689 Coagulation defect, unspecified: Secondary | ICD-10-CM | POA: Diagnosis not present

## 2020-12-10 DIAGNOSIS — N186 End stage renal disease: Secondary | ICD-10-CM | POA: Diagnosis not present

## 2020-12-10 DIAGNOSIS — N2581 Secondary hyperparathyroidism of renal origin: Secondary | ICD-10-CM | POA: Diagnosis not present

## 2020-12-10 DIAGNOSIS — D509 Iron deficiency anemia, unspecified: Secondary | ICD-10-CM | POA: Diagnosis not present

## 2020-12-12 DIAGNOSIS — D689 Coagulation defect, unspecified: Secondary | ICD-10-CM | POA: Diagnosis not present

## 2020-12-12 DIAGNOSIS — N2581 Secondary hyperparathyroidism of renal origin: Secondary | ICD-10-CM | POA: Diagnosis not present

## 2020-12-12 DIAGNOSIS — D509 Iron deficiency anemia, unspecified: Secondary | ICD-10-CM | POA: Diagnosis not present

## 2020-12-12 DIAGNOSIS — N186 End stage renal disease: Secondary | ICD-10-CM | POA: Diagnosis not present

## 2020-12-12 DIAGNOSIS — Z992 Dependence on renal dialysis: Secondary | ICD-10-CM | POA: Diagnosis not present

## 2020-12-14 DIAGNOSIS — N186 End stage renal disease: Secondary | ICD-10-CM | POA: Diagnosis not present

## 2020-12-14 DIAGNOSIS — D509 Iron deficiency anemia, unspecified: Secondary | ICD-10-CM | POA: Diagnosis not present

## 2020-12-14 DIAGNOSIS — D689 Coagulation defect, unspecified: Secondary | ICD-10-CM | POA: Diagnosis not present

## 2020-12-14 DIAGNOSIS — Z992 Dependence on renal dialysis: Secondary | ICD-10-CM | POA: Diagnosis not present

## 2020-12-14 DIAGNOSIS — N2581 Secondary hyperparathyroidism of renal origin: Secondary | ICD-10-CM | POA: Diagnosis not present

## 2020-12-17 DIAGNOSIS — N2581 Secondary hyperparathyroidism of renal origin: Secondary | ICD-10-CM | POA: Diagnosis not present

## 2020-12-17 DIAGNOSIS — D689 Coagulation defect, unspecified: Secondary | ICD-10-CM | POA: Diagnosis not present

## 2020-12-17 DIAGNOSIS — D509 Iron deficiency anemia, unspecified: Secondary | ICD-10-CM | POA: Diagnosis not present

## 2020-12-17 DIAGNOSIS — L299 Pruritus, unspecified: Secondary | ICD-10-CM | POA: Diagnosis not present

## 2020-12-17 DIAGNOSIS — Z992 Dependence on renal dialysis: Secondary | ICD-10-CM | POA: Diagnosis not present

## 2020-12-17 DIAGNOSIS — N186 End stage renal disease: Secondary | ICD-10-CM | POA: Diagnosis not present

## 2020-12-19 DIAGNOSIS — D689 Coagulation defect, unspecified: Secondary | ICD-10-CM | POA: Diagnosis not present

## 2020-12-19 DIAGNOSIS — L299 Pruritus, unspecified: Secondary | ICD-10-CM | POA: Diagnosis not present

## 2020-12-19 DIAGNOSIS — N2581 Secondary hyperparathyroidism of renal origin: Secondary | ICD-10-CM | POA: Diagnosis not present

## 2020-12-19 DIAGNOSIS — Z992 Dependence on renal dialysis: Secondary | ICD-10-CM | POA: Diagnosis not present

## 2020-12-19 DIAGNOSIS — D509 Iron deficiency anemia, unspecified: Secondary | ICD-10-CM | POA: Diagnosis not present

## 2020-12-19 DIAGNOSIS — N186 End stage renal disease: Secondary | ICD-10-CM | POA: Diagnosis not present

## 2020-12-21 DIAGNOSIS — D689 Coagulation defect, unspecified: Secondary | ICD-10-CM | POA: Diagnosis not present

## 2020-12-21 DIAGNOSIS — I150 Renovascular hypertension: Secondary | ICD-10-CM | POA: Diagnosis not present

## 2020-12-21 DIAGNOSIS — N2581 Secondary hyperparathyroidism of renal origin: Secondary | ICD-10-CM | POA: Diagnosis not present

## 2020-12-21 DIAGNOSIS — L299 Pruritus, unspecified: Secondary | ICD-10-CM | POA: Diagnosis not present

## 2020-12-21 DIAGNOSIS — N186 End stage renal disease: Secondary | ICD-10-CM | POA: Diagnosis not present

## 2020-12-21 DIAGNOSIS — Z992 Dependence on renal dialysis: Secondary | ICD-10-CM | POA: Diagnosis not present

## 2020-12-21 DIAGNOSIS — D509 Iron deficiency anemia, unspecified: Secondary | ICD-10-CM | POA: Diagnosis not present

## 2020-12-22 DIAGNOSIS — R0602 Shortness of breath: Secondary | ICD-10-CM | POA: Diagnosis not present

## 2020-12-24 DIAGNOSIS — D689 Coagulation defect, unspecified: Secondary | ICD-10-CM | POA: Diagnosis not present

## 2020-12-24 DIAGNOSIS — N186 End stage renal disease: Secondary | ICD-10-CM | POA: Diagnosis not present

## 2020-12-24 DIAGNOSIS — Z992 Dependence on renal dialysis: Secondary | ICD-10-CM | POA: Diagnosis not present

## 2020-12-24 DIAGNOSIS — N2581 Secondary hyperparathyroidism of renal origin: Secondary | ICD-10-CM | POA: Diagnosis not present

## 2020-12-24 DIAGNOSIS — L299 Pruritus, unspecified: Secondary | ICD-10-CM | POA: Diagnosis not present

## 2020-12-24 DIAGNOSIS — D509 Iron deficiency anemia, unspecified: Secondary | ICD-10-CM | POA: Diagnosis not present

## 2020-12-25 ENCOUNTER — Ambulatory Visit (HOSPITAL_COMMUNITY): Payer: Medicare HMO

## 2020-12-26 ENCOUNTER — Telehealth: Payer: Self-pay | Admitting: Pulmonary Disease

## 2020-12-26 DIAGNOSIS — D689 Coagulation defect, unspecified: Secondary | ICD-10-CM | POA: Diagnosis not present

## 2020-12-26 DIAGNOSIS — N2581 Secondary hyperparathyroidism of renal origin: Secondary | ICD-10-CM | POA: Diagnosis not present

## 2020-12-26 DIAGNOSIS — L299 Pruritus, unspecified: Secondary | ICD-10-CM | POA: Diagnosis not present

## 2020-12-26 DIAGNOSIS — Z992 Dependence on renal dialysis: Secondary | ICD-10-CM | POA: Diagnosis not present

## 2020-12-26 DIAGNOSIS — D509 Iron deficiency anemia, unspecified: Secondary | ICD-10-CM | POA: Diagnosis not present

## 2020-12-26 DIAGNOSIS — N186 End stage renal disease: Secondary | ICD-10-CM | POA: Diagnosis not present

## 2020-12-26 NOTE — Telephone Encounter (Signed)
Schedule for follow-up appointment  Did miss scheduled high-res CT appointment

## 2020-12-26 NOTE — Telephone Encounter (Signed)
Call made to patient, confirmed DOB. She states she forgot about the appt. I gave her the number to reschedule her CT. F/U appt made.   Nothing further needed at this time.

## 2020-12-28 DIAGNOSIS — Z992 Dependence on renal dialysis: Secondary | ICD-10-CM | POA: Diagnosis not present

## 2020-12-28 DIAGNOSIS — L299 Pruritus, unspecified: Secondary | ICD-10-CM | POA: Diagnosis not present

## 2020-12-28 DIAGNOSIS — N2581 Secondary hyperparathyroidism of renal origin: Secondary | ICD-10-CM | POA: Diagnosis not present

## 2020-12-28 DIAGNOSIS — D689 Coagulation defect, unspecified: Secondary | ICD-10-CM | POA: Diagnosis not present

## 2020-12-28 DIAGNOSIS — N186 End stage renal disease: Secondary | ICD-10-CM | POA: Diagnosis not present

## 2020-12-28 DIAGNOSIS — D509 Iron deficiency anemia, unspecified: Secondary | ICD-10-CM | POA: Diagnosis not present

## 2020-12-31 DIAGNOSIS — Z992 Dependence on renal dialysis: Secondary | ICD-10-CM | POA: Diagnosis not present

## 2020-12-31 DIAGNOSIS — D509 Iron deficiency anemia, unspecified: Secondary | ICD-10-CM | POA: Diagnosis not present

## 2020-12-31 DIAGNOSIS — N2581 Secondary hyperparathyroidism of renal origin: Secondary | ICD-10-CM | POA: Diagnosis not present

## 2020-12-31 DIAGNOSIS — D631 Anemia in chronic kidney disease: Secondary | ICD-10-CM | POA: Diagnosis not present

## 2020-12-31 DIAGNOSIS — D689 Coagulation defect, unspecified: Secondary | ICD-10-CM | POA: Diagnosis not present

## 2020-12-31 DIAGNOSIS — N186 End stage renal disease: Secondary | ICD-10-CM | POA: Diagnosis not present

## 2020-12-31 DIAGNOSIS — L299 Pruritus, unspecified: Secondary | ICD-10-CM | POA: Diagnosis not present

## 2021-01-02 DIAGNOSIS — L299 Pruritus, unspecified: Secondary | ICD-10-CM | POA: Diagnosis not present

## 2021-01-02 DIAGNOSIS — N186 End stage renal disease: Secondary | ICD-10-CM | POA: Diagnosis not present

## 2021-01-02 DIAGNOSIS — Z992 Dependence on renal dialysis: Secondary | ICD-10-CM | POA: Diagnosis not present

## 2021-01-02 DIAGNOSIS — N2581 Secondary hyperparathyroidism of renal origin: Secondary | ICD-10-CM | POA: Diagnosis not present

## 2021-01-02 DIAGNOSIS — D689 Coagulation defect, unspecified: Secondary | ICD-10-CM | POA: Diagnosis not present

## 2021-01-02 DIAGNOSIS — D631 Anemia in chronic kidney disease: Secondary | ICD-10-CM | POA: Diagnosis not present

## 2021-01-02 DIAGNOSIS — D509 Iron deficiency anemia, unspecified: Secondary | ICD-10-CM | POA: Diagnosis not present

## 2021-01-03 ENCOUNTER — Telehealth: Payer: Self-pay

## 2021-01-03 NOTE — Telephone Encounter (Signed)
Patient called in to report O2 vitals while at dialysis. Patient was sent to triage for evaluation of symptoms.

## 2021-01-03 NOTE — Telephone Encounter (Signed)
HD should call 911 as she was in their care.  If she has been sent home from HD and is slurring her words, she needs to call 911

## 2021-01-03 NOTE — Telephone Encounter (Signed)
Called patient and left voicemail for her to return my call when possible.

## 2021-01-03 NOTE — Telephone Encounter (Signed)
Caller name:Kennya Quentin [place]   On DPR? :Yes  Call back number:[phone]  Provider they see: Birdie Riddle   Reason for call:Pt is calling her oxygen level between 70 and 80 and dialisi I have sent pt to triage pt is having slurred speech

## 2021-01-04 DIAGNOSIS — D689 Coagulation defect, unspecified: Secondary | ICD-10-CM | POA: Diagnosis not present

## 2021-01-04 DIAGNOSIS — D631 Anemia in chronic kidney disease: Secondary | ICD-10-CM | POA: Diagnosis not present

## 2021-01-04 DIAGNOSIS — D509 Iron deficiency anemia, unspecified: Secondary | ICD-10-CM | POA: Diagnosis not present

## 2021-01-04 DIAGNOSIS — N186 End stage renal disease: Secondary | ICD-10-CM | POA: Diagnosis not present

## 2021-01-04 DIAGNOSIS — N2581 Secondary hyperparathyroidism of renal origin: Secondary | ICD-10-CM | POA: Diagnosis not present

## 2021-01-04 DIAGNOSIS — Z992 Dependence on renal dialysis: Secondary | ICD-10-CM | POA: Diagnosis not present

## 2021-01-04 DIAGNOSIS — L299 Pruritus, unspecified: Secondary | ICD-10-CM | POA: Diagnosis not present

## 2021-01-07 DIAGNOSIS — N2581 Secondary hyperparathyroidism of renal origin: Secondary | ICD-10-CM | POA: Diagnosis not present

## 2021-01-07 DIAGNOSIS — N186 End stage renal disease: Secondary | ICD-10-CM | POA: Diagnosis not present

## 2021-01-07 DIAGNOSIS — L299 Pruritus, unspecified: Secondary | ICD-10-CM | POA: Diagnosis not present

## 2021-01-07 DIAGNOSIS — Z992 Dependence on renal dialysis: Secondary | ICD-10-CM | POA: Diagnosis not present

## 2021-01-07 DIAGNOSIS — R0602 Shortness of breath: Secondary | ICD-10-CM | POA: Diagnosis not present

## 2021-01-07 DIAGNOSIS — E1129 Type 2 diabetes mellitus with other diabetic kidney complication: Secondary | ICD-10-CM | POA: Diagnosis not present

## 2021-01-07 DIAGNOSIS — D509 Iron deficiency anemia, unspecified: Secondary | ICD-10-CM | POA: Diagnosis not present

## 2021-01-07 DIAGNOSIS — D689 Coagulation defect, unspecified: Secondary | ICD-10-CM | POA: Diagnosis not present

## 2021-01-09 DIAGNOSIS — N2581 Secondary hyperparathyroidism of renal origin: Secondary | ICD-10-CM | POA: Diagnosis not present

## 2021-01-09 DIAGNOSIS — D689 Coagulation defect, unspecified: Secondary | ICD-10-CM | POA: Diagnosis not present

## 2021-01-09 DIAGNOSIS — L299 Pruritus, unspecified: Secondary | ICD-10-CM | POA: Diagnosis not present

## 2021-01-09 DIAGNOSIS — E1129 Type 2 diabetes mellitus with other diabetic kidney complication: Secondary | ICD-10-CM | POA: Diagnosis not present

## 2021-01-09 DIAGNOSIS — Z992 Dependence on renal dialysis: Secondary | ICD-10-CM | POA: Diagnosis not present

## 2021-01-09 DIAGNOSIS — D509 Iron deficiency anemia, unspecified: Secondary | ICD-10-CM | POA: Diagnosis not present

## 2021-01-09 DIAGNOSIS — N186 End stage renal disease: Secondary | ICD-10-CM | POA: Diagnosis not present

## 2021-01-11 DIAGNOSIS — D689 Coagulation defect, unspecified: Secondary | ICD-10-CM | POA: Diagnosis not present

## 2021-01-11 DIAGNOSIS — D509 Iron deficiency anemia, unspecified: Secondary | ICD-10-CM | POA: Diagnosis not present

## 2021-01-11 DIAGNOSIS — N2581 Secondary hyperparathyroidism of renal origin: Secondary | ICD-10-CM | POA: Diagnosis not present

## 2021-01-11 DIAGNOSIS — N186 End stage renal disease: Secondary | ICD-10-CM | POA: Diagnosis not present

## 2021-01-11 DIAGNOSIS — L299 Pruritus, unspecified: Secondary | ICD-10-CM | POA: Diagnosis not present

## 2021-01-11 DIAGNOSIS — Z992 Dependence on renal dialysis: Secondary | ICD-10-CM | POA: Diagnosis not present

## 2021-01-11 DIAGNOSIS — E1129 Type 2 diabetes mellitus with other diabetic kidney complication: Secondary | ICD-10-CM | POA: Diagnosis not present

## 2021-01-14 ENCOUNTER — Other Ambulatory Visit: Payer: Self-pay | Admitting: Family Medicine

## 2021-01-14 DIAGNOSIS — D689 Coagulation defect, unspecified: Secondary | ICD-10-CM | POA: Diagnosis not present

## 2021-01-14 DIAGNOSIS — N186 End stage renal disease: Secondary | ICD-10-CM | POA: Diagnosis not present

## 2021-01-14 DIAGNOSIS — D631 Anemia in chronic kidney disease: Secondary | ICD-10-CM | POA: Diagnosis not present

## 2021-01-14 DIAGNOSIS — Z992 Dependence on renal dialysis: Secondary | ICD-10-CM | POA: Diagnosis not present

## 2021-01-14 DIAGNOSIS — N2581 Secondary hyperparathyroidism of renal origin: Secondary | ICD-10-CM | POA: Diagnosis not present

## 2021-01-14 DIAGNOSIS — L299 Pruritus, unspecified: Secondary | ICD-10-CM | POA: Diagnosis not present

## 2021-01-18 DIAGNOSIS — D689 Coagulation defect, unspecified: Secondary | ICD-10-CM | POA: Diagnosis not present

## 2021-01-18 DIAGNOSIS — D631 Anemia in chronic kidney disease: Secondary | ICD-10-CM | POA: Diagnosis not present

## 2021-01-18 DIAGNOSIS — L299 Pruritus, unspecified: Secondary | ICD-10-CM | POA: Diagnosis not present

## 2021-01-18 DIAGNOSIS — N186 End stage renal disease: Secondary | ICD-10-CM | POA: Diagnosis not present

## 2021-01-18 DIAGNOSIS — N2581 Secondary hyperparathyroidism of renal origin: Secondary | ICD-10-CM | POA: Diagnosis not present

## 2021-01-18 DIAGNOSIS — Z992 Dependence on renal dialysis: Secondary | ICD-10-CM | POA: Diagnosis not present

## 2021-01-19 DIAGNOSIS — N2581 Secondary hyperparathyroidism of renal origin: Secondary | ICD-10-CM | POA: Diagnosis not present

## 2021-01-19 DIAGNOSIS — N186 End stage renal disease: Secondary | ICD-10-CM | POA: Diagnosis not present

## 2021-01-19 DIAGNOSIS — D689 Coagulation defect, unspecified: Secondary | ICD-10-CM | POA: Diagnosis not present

## 2021-01-19 DIAGNOSIS — D631 Anemia in chronic kidney disease: Secondary | ICD-10-CM | POA: Diagnosis not present

## 2021-01-19 DIAGNOSIS — Z992 Dependence on renal dialysis: Secondary | ICD-10-CM | POA: Diagnosis not present

## 2021-01-19 DIAGNOSIS — L299 Pruritus, unspecified: Secondary | ICD-10-CM | POA: Diagnosis not present

## 2021-01-21 DIAGNOSIS — D689 Coagulation defect, unspecified: Secondary | ICD-10-CM | POA: Diagnosis not present

## 2021-01-21 DIAGNOSIS — Z992 Dependence on renal dialysis: Secondary | ICD-10-CM | POA: Diagnosis not present

## 2021-01-21 DIAGNOSIS — I150 Renovascular hypertension: Secondary | ICD-10-CM | POA: Diagnosis not present

## 2021-01-21 DIAGNOSIS — N2581 Secondary hyperparathyroidism of renal origin: Secondary | ICD-10-CM | POA: Diagnosis not present

## 2021-01-21 DIAGNOSIS — Z23 Encounter for immunization: Secondary | ICD-10-CM | POA: Diagnosis not present

## 2021-01-21 DIAGNOSIS — N186 End stage renal disease: Secondary | ICD-10-CM | POA: Diagnosis not present

## 2021-01-22 ENCOUNTER — Ambulatory Visit: Payer: Medicare HMO | Admitting: Pulmonary Disease

## 2021-01-22 DIAGNOSIS — L299 Pruritus, unspecified: Secondary | ICD-10-CM | POA: Diagnosis not present

## 2021-01-22 DIAGNOSIS — Z992 Dependence on renal dialysis: Secondary | ICD-10-CM | POA: Diagnosis not present

## 2021-01-22 DIAGNOSIS — N2581 Secondary hyperparathyroidism of renal origin: Secondary | ICD-10-CM | POA: Diagnosis not present

## 2021-01-22 DIAGNOSIS — R0602 Shortness of breath: Secondary | ICD-10-CM | POA: Diagnosis not present

## 2021-01-22 DIAGNOSIS — N186 End stage renal disease: Secondary | ICD-10-CM | POA: Diagnosis not present

## 2021-01-22 DIAGNOSIS — D689 Coagulation defect, unspecified: Secondary | ICD-10-CM | POA: Diagnosis not present

## 2021-01-23 DIAGNOSIS — D689 Coagulation defect, unspecified: Secondary | ICD-10-CM | POA: Diagnosis not present

## 2021-01-23 DIAGNOSIS — N186 End stage renal disease: Secondary | ICD-10-CM | POA: Diagnosis not present

## 2021-01-23 DIAGNOSIS — T7840XA Allergy, unspecified, initial encounter: Secondary | ICD-10-CM | POA: Insufficient documentation

## 2021-01-23 DIAGNOSIS — Z992 Dependence on renal dialysis: Secondary | ICD-10-CM | POA: Diagnosis not present

## 2021-01-23 DIAGNOSIS — L299 Pruritus, unspecified: Secondary | ICD-10-CM | POA: Diagnosis not present

## 2021-01-23 DIAGNOSIS — T782XXA Anaphylactic shock, unspecified, initial encounter: Secondary | ICD-10-CM | POA: Insufficient documentation

## 2021-01-23 DIAGNOSIS — N2581 Secondary hyperparathyroidism of renal origin: Secondary | ICD-10-CM | POA: Diagnosis not present

## 2021-01-25 DIAGNOSIS — D689 Coagulation defect, unspecified: Secondary | ICD-10-CM | POA: Diagnosis not present

## 2021-01-25 DIAGNOSIS — L299 Pruritus, unspecified: Secondary | ICD-10-CM | POA: Diagnosis not present

## 2021-01-25 DIAGNOSIS — Z992 Dependence on renal dialysis: Secondary | ICD-10-CM | POA: Diagnosis not present

## 2021-01-25 DIAGNOSIS — N186 End stage renal disease: Secondary | ICD-10-CM | POA: Diagnosis not present

## 2021-01-25 DIAGNOSIS — N2581 Secondary hyperparathyroidism of renal origin: Secondary | ICD-10-CM | POA: Diagnosis not present

## 2021-01-28 DIAGNOSIS — Z992 Dependence on renal dialysis: Secondary | ICD-10-CM | POA: Diagnosis not present

## 2021-01-28 DIAGNOSIS — L299 Pruritus, unspecified: Secondary | ICD-10-CM | POA: Diagnosis not present

## 2021-01-28 DIAGNOSIS — N2581 Secondary hyperparathyroidism of renal origin: Secondary | ICD-10-CM | POA: Diagnosis not present

## 2021-01-28 DIAGNOSIS — N186 End stage renal disease: Secondary | ICD-10-CM | POA: Diagnosis not present

## 2021-01-28 DIAGNOSIS — D689 Coagulation defect, unspecified: Secondary | ICD-10-CM | POA: Diagnosis not present

## 2021-01-29 ENCOUNTER — Ambulatory Visit
Admission: RE | Admit: 2021-01-29 | Discharge: 2021-01-29 | Disposition: A | Discharge status: Home / Self Care | Payer: Medicare HMO | Source: Ambulatory Visit | Attending: Nephrology | Admitting: Nephrology

## 2021-01-29 ENCOUNTER — Other Ambulatory Visit: Payer: Self-pay

## 2021-01-29 ENCOUNTER — Other Ambulatory Visit: Payer: Self-pay | Admitting: Nephrology

## 2021-01-29 DIAGNOSIS — R0902 Hypoxemia: Secondary | ICD-10-CM

## 2021-01-29 DIAGNOSIS — R059 Cough, unspecified: Secondary | ICD-10-CM | POA: Diagnosis not present

## 2021-01-30 DIAGNOSIS — Z992 Dependence on renal dialysis: Secondary | ICD-10-CM | POA: Diagnosis not present

## 2021-01-30 DIAGNOSIS — D689 Coagulation defect, unspecified: Secondary | ICD-10-CM | POA: Diagnosis not present

## 2021-01-30 DIAGNOSIS — N186 End stage renal disease: Secondary | ICD-10-CM | POA: Diagnosis not present

## 2021-01-30 DIAGNOSIS — L299 Pruritus, unspecified: Secondary | ICD-10-CM | POA: Diagnosis not present

## 2021-01-30 DIAGNOSIS — N2581 Secondary hyperparathyroidism of renal origin: Secondary | ICD-10-CM | POA: Diagnosis not present

## 2021-02-01 DIAGNOSIS — N186 End stage renal disease: Secondary | ICD-10-CM | POA: Diagnosis not present

## 2021-02-01 DIAGNOSIS — L299 Pruritus, unspecified: Secondary | ICD-10-CM | POA: Diagnosis not present

## 2021-02-01 DIAGNOSIS — D689 Coagulation defect, unspecified: Secondary | ICD-10-CM | POA: Diagnosis not present

## 2021-02-01 DIAGNOSIS — Z992 Dependence on renal dialysis: Secondary | ICD-10-CM | POA: Diagnosis not present

## 2021-02-01 DIAGNOSIS — N2581 Secondary hyperparathyroidism of renal origin: Secondary | ICD-10-CM | POA: Diagnosis not present

## 2021-02-02 ENCOUNTER — Other Ambulatory Visit: Payer: Self-pay | Admitting: Family Medicine

## 2021-02-02 DIAGNOSIS — I10 Essential (primary) hypertension: Secondary | ICD-10-CM

## 2021-02-04 DIAGNOSIS — N2581 Secondary hyperparathyroidism of renal origin: Secondary | ICD-10-CM | POA: Diagnosis not present

## 2021-02-04 DIAGNOSIS — Z139 Encounter for screening, unspecified: Secondary | ICD-10-CM | POA: Diagnosis not present

## 2021-02-04 DIAGNOSIS — N186 End stage renal disease: Secondary | ICD-10-CM | POA: Diagnosis not present

## 2021-02-04 DIAGNOSIS — D689 Coagulation defect, unspecified: Secondary | ICD-10-CM | POA: Diagnosis not present

## 2021-02-04 DIAGNOSIS — L299 Pruritus, unspecified: Secondary | ICD-10-CM | POA: Diagnosis not present

## 2021-02-04 DIAGNOSIS — Z992 Dependence on renal dialysis: Secondary | ICD-10-CM | POA: Diagnosis not present

## 2021-02-06 DIAGNOSIS — N186 End stage renal disease: Secondary | ICD-10-CM | POA: Diagnosis not present

## 2021-02-06 DIAGNOSIS — L299 Pruritus, unspecified: Secondary | ICD-10-CM | POA: Diagnosis not present

## 2021-02-06 DIAGNOSIS — Z992 Dependence on renal dialysis: Secondary | ICD-10-CM | POA: Diagnosis not present

## 2021-02-06 DIAGNOSIS — Z139 Encounter for screening, unspecified: Secondary | ICD-10-CM | POA: Diagnosis not present

## 2021-02-06 DIAGNOSIS — N2581 Secondary hyperparathyroidism of renal origin: Secondary | ICD-10-CM | POA: Diagnosis not present

## 2021-02-06 DIAGNOSIS — D689 Coagulation defect, unspecified: Secondary | ICD-10-CM | POA: Diagnosis not present

## 2021-02-08 DIAGNOSIS — Z992 Dependence on renal dialysis: Secondary | ICD-10-CM | POA: Diagnosis not present

## 2021-02-08 DIAGNOSIS — L299 Pruritus, unspecified: Secondary | ICD-10-CM | POA: Diagnosis not present

## 2021-02-08 DIAGNOSIS — D689 Coagulation defect, unspecified: Secondary | ICD-10-CM | POA: Diagnosis not present

## 2021-02-08 DIAGNOSIS — N2581 Secondary hyperparathyroidism of renal origin: Secondary | ICD-10-CM | POA: Diagnosis not present

## 2021-02-08 DIAGNOSIS — Z139 Encounter for screening, unspecified: Secondary | ICD-10-CM | POA: Diagnosis not present

## 2021-02-08 DIAGNOSIS — N186 End stage renal disease: Secondary | ICD-10-CM | POA: Diagnosis not present

## 2021-02-11 DIAGNOSIS — N2581 Secondary hyperparathyroidism of renal origin: Secondary | ICD-10-CM | POA: Diagnosis not present

## 2021-02-11 DIAGNOSIS — Z992 Dependence on renal dialysis: Secondary | ICD-10-CM | POA: Diagnosis not present

## 2021-02-11 DIAGNOSIS — D689 Coagulation defect, unspecified: Secondary | ICD-10-CM | POA: Diagnosis not present

## 2021-02-11 DIAGNOSIS — N186 End stage renal disease: Secondary | ICD-10-CM | POA: Diagnosis not present

## 2021-02-13 DIAGNOSIS — N2581 Secondary hyperparathyroidism of renal origin: Secondary | ICD-10-CM | POA: Diagnosis not present

## 2021-02-13 DIAGNOSIS — D689 Coagulation defect, unspecified: Secondary | ICD-10-CM | POA: Diagnosis not present

## 2021-02-13 DIAGNOSIS — Z992 Dependence on renal dialysis: Secondary | ICD-10-CM | POA: Diagnosis not present

## 2021-02-13 DIAGNOSIS — N186 End stage renal disease: Secondary | ICD-10-CM | POA: Diagnosis not present

## 2021-02-15 DIAGNOSIS — D689 Coagulation defect, unspecified: Secondary | ICD-10-CM | POA: Diagnosis not present

## 2021-02-15 DIAGNOSIS — N186 End stage renal disease: Secondary | ICD-10-CM | POA: Diagnosis not present

## 2021-02-15 DIAGNOSIS — N2581 Secondary hyperparathyroidism of renal origin: Secondary | ICD-10-CM | POA: Diagnosis not present

## 2021-02-15 DIAGNOSIS — Z992 Dependence on renal dialysis: Secondary | ICD-10-CM | POA: Diagnosis not present

## 2021-02-18 DIAGNOSIS — Z992 Dependence on renal dialysis: Secondary | ICD-10-CM | POA: Diagnosis not present

## 2021-02-18 DIAGNOSIS — N2581 Secondary hyperparathyroidism of renal origin: Secondary | ICD-10-CM | POA: Diagnosis not present

## 2021-02-18 DIAGNOSIS — D689 Coagulation defect, unspecified: Secondary | ICD-10-CM | POA: Diagnosis not present

## 2021-02-18 DIAGNOSIS — N186 End stage renal disease: Secondary | ICD-10-CM | POA: Diagnosis not present

## 2021-02-20 DIAGNOSIS — N186 End stage renal disease: Secondary | ICD-10-CM | POA: Diagnosis not present

## 2021-02-20 DIAGNOSIS — I150 Renovascular hypertension: Secondary | ICD-10-CM | POA: Diagnosis not present

## 2021-02-20 DIAGNOSIS — Z992 Dependence on renal dialysis: Secondary | ICD-10-CM | POA: Diagnosis not present

## 2021-02-20 DIAGNOSIS — N2581 Secondary hyperparathyroidism of renal origin: Secondary | ICD-10-CM | POA: Diagnosis not present

## 2021-02-20 DIAGNOSIS — D689 Coagulation defect, unspecified: Secondary | ICD-10-CM | POA: Diagnosis not present

## 2021-02-21 DIAGNOSIS — R0602 Shortness of breath: Secondary | ICD-10-CM | POA: Diagnosis not present

## 2021-02-22 DIAGNOSIS — Z992 Dependence on renal dialysis: Secondary | ICD-10-CM | POA: Diagnosis not present

## 2021-02-22 DIAGNOSIS — D689 Coagulation defect, unspecified: Secondary | ICD-10-CM | POA: Diagnosis not present

## 2021-02-22 DIAGNOSIS — N186 End stage renal disease: Secondary | ICD-10-CM | POA: Diagnosis not present

## 2021-02-22 DIAGNOSIS — N2581 Secondary hyperparathyroidism of renal origin: Secondary | ICD-10-CM | POA: Diagnosis not present

## 2021-02-22 DIAGNOSIS — L299 Pruritus, unspecified: Secondary | ICD-10-CM | POA: Diagnosis not present

## 2021-02-25 DIAGNOSIS — Z992 Dependence on renal dialysis: Secondary | ICD-10-CM | POA: Diagnosis not present

## 2021-02-25 DIAGNOSIS — D689 Coagulation defect, unspecified: Secondary | ICD-10-CM | POA: Diagnosis not present

## 2021-02-25 DIAGNOSIS — N186 End stage renal disease: Secondary | ICD-10-CM | POA: Diagnosis not present

## 2021-02-25 DIAGNOSIS — L299 Pruritus, unspecified: Secondary | ICD-10-CM | POA: Diagnosis not present

## 2021-02-25 DIAGNOSIS — D631 Anemia in chronic kidney disease: Secondary | ICD-10-CM | POA: Diagnosis not present

## 2021-02-25 DIAGNOSIS — N2581 Secondary hyperparathyroidism of renal origin: Secondary | ICD-10-CM | POA: Diagnosis not present

## 2021-02-27 DIAGNOSIS — D631 Anemia in chronic kidney disease: Secondary | ICD-10-CM | POA: Diagnosis not present

## 2021-02-27 DIAGNOSIS — Z992 Dependence on renal dialysis: Secondary | ICD-10-CM | POA: Diagnosis not present

## 2021-02-27 DIAGNOSIS — N2581 Secondary hyperparathyroidism of renal origin: Secondary | ICD-10-CM | POA: Diagnosis not present

## 2021-02-27 DIAGNOSIS — N186 End stage renal disease: Secondary | ICD-10-CM | POA: Diagnosis not present

## 2021-02-27 DIAGNOSIS — L299 Pruritus, unspecified: Secondary | ICD-10-CM | POA: Diagnosis not present

## 2021-02-27 DIAGNOSIS — D689 Coagulation defect, unspecified: Secondary | ICD-10-CM | POA: Diagnosis not present

## 2021-03-01 DIAGNOSIS — L299 Pruritus, unspecified: Secondary | ICD-10-CM | POA: Diagnosis not present

## 2021-03-01 DIAGNOSIS — N2581 Secondary hyperparathyroidism of renal origin: Secondary | ICD-10-CM | POA: Diagnosis not present

## 2021-03-01 DIAGNOSIS — D689 Coagulation defect, unspecified: Secondary | ICD-10-CM | POA: Diagnosis not present

## 2021-03-01 DIAGNOSIS — Z992 Dependence on renal dialysis: Secondary | ICD-10-CM | POA: Diagnosis not present

## 2021-03-01 DIAGNOSIS — D631 Anemia in chronic kidney disease: Secondary | ICD-10-CM | POA: Diagnosis not present

## 2021-03-01 DIAGNOSIS — N186 End stage renal disease: Secondary | ICD-10-CM | POA: Diagnosis not present

## 2021-03-04 DIAGNOSIS — N186 End stage renal disease: Secondary | ICD-10-CM | POA: Diagnosis not present

## 2021-03-04 DIAGNOSIS — Z992 Dependence on renal dialysis: Secondary | ICD-10-CM | POA: Diagnosis not present

## 2021-03-04 DIAGNOSIS — L299 Pruritus, unspecified: Secondary | ICD-10-CM | POA: Diagnosis not present

## 2021-03-04 DIAGNOSIS — N2581 Secondary hyperparathyroidism of renal origin: Secondary | ICD-10-CM | POA: Diagnosis not present

## 2021-03-04 DIAGNOSIS — D689 Coagulation defect, unspecified: Secondary | ICD-10-CM | POA: Diagnosis not present

## 2021-03-06 DIAGNOSIS — N186 End stage renal disease: Secondary | ICD-10-CM | POA: Diagnosis not present

## 2021-03-06 DIAGNOSIS — N2581 Secondary hyperparathyroidism of renal origin: Secondary | ICD-10-CM | POA: Diagnosis not present

## 2021-03-06 DIAGNOSIS — L299 Pruritus, unspecified: Secondary | ICD-10-CM | POA: Diagnosis not present

## 2021-03-06 DIAGNOSIS — D689 Coagulation defect, unspecified: Secondary | ICD-10-CM | POA: Diagnosis not present

## 2021-03-06 DIAGNOSIS — Z992 Dependence on renal dialysis: Secondary | ICD-10-CM | POA: Diagnosis not present

## 2021-03-08 DIAGNOSIS — N2581 Secondary hyperparathyroidism of renal origin: Secondary | ICD-10-CM | POA: Diagnosis not present

## 2021-03-08 DIAGNOSIS — N186 End stage renal disease: Secondary | ICD-10-CM | POA: Diagnosis not present

## 2021-03-08 DIAGNOSIS — L299 Pruritus, unspecified: Secondary | ICD-10-CM | POA: Diagnosis not present

## 2021-03-08 DIAGNOSIS — Z992 Dependence on renal dialysis: Secondary | ICD-10-CM | POA: Diagnosis not present

## 2021-03-08 DIAGNOSIS — D689 Coagulation defect, unspecified: Secondary | ICD-10-CM | POA: Diagnosis not present

## 2021-03-09 DIAGNOSIS — R0602 Shortness of breath: Secondary | ICD-10-CM | POA: Diagnosis not present

## 2021-03-11 ENCOUNTER — Telehealth: Payer: Self-pay | Admitting: *Deleted

## 2021-03-11 DIAGNOSIS — L299 Pruritus, unspecified: Secondary | ICD-10-CM | POA: Diagnosis not present

## 2021-03-11 DIAGNOSIS — N186 End stage renal disease: Secondary | ICD-10-CM | POA: Diagnosis not present

## 2021-03-11 DIAGNOSIS — N2581 Secondary hyperparathyroidism of renal origin: Secondary | ICD-10-CM | POA: Diagnosis not present

## 2021-03-11 DIAGNOSIS — D631 Anemia in chronic kidney disease: Secondary | ICD-10-CM | POA: Diagnosis not present

## 2021-03-11 DIAGNOSIS — D689 Coagulation defect, unspecified: Secondary | ICD-10-CM | POA: Diagnosis not present

## 2021-03-11 DIAGNOSIS — Z992 Dependence on renal dialysis: Secondary | ICD-10-CM | POA: Diagnosis not present

## 2021-03-11 NOTE — Chronic Care Management (AMB) (Signed)
Chronic Care Management   Note  03/11/2021 Name: GABRELLA STROH MRN: [id number] DOB: 06-Sep-1955  INDRIA BISHARA is a 65 y.o. year old female who is a primary care patient of Birdie Riddle, [name], MD. I reached out to Luellen Pucker by phone today in response to a referral sent by Ms. Aletha Halim Vanderzanden's PCP.  Ms. Rashid was given information about Chronic Care Management services today including:  CCM service includes personalized support from designated clinical staff supervised by her physician, including individualized plan of care and coordination with other care providers 24/7 contact phone numbers for assistance for urgent and routine care needs. Service will only be billed when office clinical staff spend 20 minutes or more in a month to coordinate care. Only one practitioner may furnish and bill the service in a calendar month. The patient may stop CCM services at any time (effective at the end of the month) by phone call to the office staff. The patient is responsible for co-pay (up to 20% after annual deductible is met) if co-pay is required by the individual health plan.   Patient agreed to services and verbal consent obtained.   Follow up plan: Telephone appointment with care management team member scheduled for:03/20/21  [place] [facility]  Direct Dial: [phone]

## 2021-03-12 DIAGNOSIS — E669 Obesity, unspecified: Secondary | ICD-10-CM | POA: Diagnosis not present

## 2021-03-12 DIAGNOSIS — I120 Hypertensive chronic kidney disease with stage 5 chronic kidney disease or end stage renal disease: Secondary | ICD-10-CM | POA: Diagnosis not present

## 2021-03-12 DIAGNOSIS — G8929 Other chronic pain: Secondary | ICD-10-CM | POA: Diagnosis not present

## 2021-03-12 DIAGNOSIS — I2510 Atherosclerotic heart disease of native coronary artery without angina pectoris: Secondary | ICD-10-CM | POA: Diagnosis not present

## 2021-03-12 DIAGNOSIS — N186 End stage renal disease: Secondary | ICD-10-CM | POA: Diagnosis not present

## 2021-03-12 DIAGNOSIS — E1122 Type 2 diabetes mellitus with diabetic chronic kidney disease: Secondary | ICD-10-CM | POA: Diagnosis not present

## 2021-03-12 DIAGNOSIS — Z992 Dependence on renal dialysis: Secondary | ICD-10-CM | POA: Diagnosis not present

## 2021-03-12 DIAGNOSIS — R69 Illness, unspecified: Secondary | ICD-10-CM | POA: Diagnosis not present

## 2021-03-12 DIAGNOSIS — Z008 Encounter for other general examination: Secondary | ICD-10-CM | POA: Diagnosis not present

## 2021-03-12 DIAGNOSIS — E1142 Type 2 diabetes mellitus with diabetic polyneuropathy: Secondary | ICD-10-CM | POA: Diagnosis not present

## 2021-03-13 DIAGNOSIS — N186 End stage renal disease: Secondary | ICD-10-CM | POA: Diagnosis not present

## 2021-03-13 DIAGNOSIS — D631 Anemia in chronic kidney disease: Secondary | ICD-10-CM | POA: Diagnosis not present

## 2021-03-13 DIAGNOSIS — Z992 Dependence on renal dialysis: Secondary | ICD-10-CM | POA: Diagnosis not present

## 2021-03-13 DIAGNOSIS — N2581 Secondary hyperparathyroidism of renal origin: Secondary | ICD-10-CM | POA: Diagnosis not present

## 2021-03-13 DIAGNOSIS — D689 Coagulation defect, unspecified: Secondary | ICD-10-CM | POA: Diagnosis not present

## 2021-03-13 DIAGNOSIS — L299 Pruritus, unspecified: Secondary | ICD-10-CM | POA: Diagnosis not present

## 2021-03-15 ENCOUNTER — Other Ambulatory Visit: Payer: Self-pay | Admitting: Family Medicine

## 2021-03-15 DIAGNOSIS — L299 Pruritus, unspecified: Secondary | ICD-10-CM | POA: Diagnosis not present

## 2021-03-15 DIAGNOSIS — Z992 Dependence on renal dialysis: Secondary | ICD-10-CM | POA: Diagnosis not present

## 2021-03-15 DIAGNOSIS — N186 End stage renal disease: Secondary | ICD-10-CM | POA: Diagnosis not present

## 2021-03-15 DIAGNOSIS — D689 Coagulation defect, unspecified: Secondary | ICD-10-CM | POA: Diagnosis not present

## 2021-03-15 DIAGNOSIS — D631 Anemia in chronic kidney disease: Secondary | ICD-10-CM | POA: Diagnosis not present

## 2021-03-15 DIAGNOSIS — N2581 Secondary hyperparathyroidism of renal origin: Secondary | ICD-10-CM | POA: Diagnosis not present

## 2021-03-18 DIAGNOSIS — D689 Coagulation defect, unspecified: Secondary | ICD-10-CM | POA: Diagnosis not present

## 2021-03-18 DIAGNOSIS — Z992 Dependence on renal dialysis: Secondary | ICD-10-CM | POA: Diagnosis not present

## 2021-03-18 DIAGNOSIS — N2581 Secondary hyperparathyroidism of renal origin: Secondary | ICD-10-CM | POA: Diagnosis not present

## 2021-03-18 DIAGNOSIS — N186 End stage renal disease: Secondary | ICD-10-CM | POA: Diagnosis not present

## 2021-03-20 ENCOUNTER — Telehealth: Payer: Medicare HMO

## 2021-03-20 ENCOUNTER — Telehealth: Payer: Self-pay

## 2021-03-20 DIAGNOSIS — Z992 Dependence on renal dialysis: Secondary | ICD-10-CM | POA: Diagnosis not present

## 2021-03-20 DIAGNOSIS — N2581 Secondary hyperparathyroidism of renal origin: Secondary | ICD-10-CM | POA: Diagnosis not present

## 2021-03-20 DIAGNOSIS — N186 End stage renal disease: Secondary | ICD-10-CM | POA: Diagnosis not present

## 2021-03-20 DIAGNOSIS — D689 Coagulation defect, unspecified: Secondary | ICD-10-CM | POA: Diagnosis not present

## 2021-03-20 NOTE — Telephone Encounter (Signed)
°  Care Management   Follow Up Note   03/20/2021 Name: [name] [name] MRN: [id number] DOB: 1955/06/26   Referred by: Midge Minium, MD Reason for referral : Chronic Care Management (RNCM: Initial Outreach Chronic Care Management and coordination needs-unsuccessful outreach)   An unsuccessful telephone outreach was attempted today. The patient was referred to the case management team for assistance with care management and care coordination.   Follow Up Plan: The care management team will reach out to the patient again over the next 30 days.  Peter Garter RN, BSN,CCM, CDE Care Management Coordinator [place] [phone], Mobile [phone]

## 2021-03-22 DIAGNOSIS — D689 Coagulation defect, unspecified: Secondary | ICD-10-CM | POA: Diagnosis not present

## 2021-03-22 DIAGNOSIS — Z992 Dependence on renal dialysis: Secondary | ICD-10-CM | POA: Diagnosis not present

## 2021-03-22 DIAGNOSIS — N186 End stage renal disease: Secondary | ICD-10-CM | POA: Diagnosis not present

## 2021-03-22 DIAGNOSIS — N2581 Secondary hyperparathyroidism of renal origin: Secondary | ICD-10-CM | POA: Diagnosis not present

## 2021-03-22 NOTE — Telephone Encounter (Signed)
Rescheduled 04/10/21

## 2021-03-23 DIAGNOSIS — I150 Renovascular hypertension: Secondary | ICD-10-CM | POA: Diagnosis not present

## 2021-03-23 DIAGNOSIS — Z992 Dependence on renal dialysis: Secondary | ICD-10-CM | POA: Diagnosis not present

## 2021-03-23 DIAGNOSIS — N186 End stage renal disease: Secondary | ICD-10-CM | POA: Diagnosis not present

## 2021-03-24 DIAGNOSIS — R0602 Shortness of breath: Secondary | ICD-10-CM | POA: Diagnosis not present

## 2021-03-25 DIAGNOSIS — Z992 Dependence on renal dialysis: Secondary | ICD-10-CM | POA: Diagnosis not present

## 2021-03-25 DIAGNOSIS — N2581 Secondary hyperparathyroidism of renal origin: Secondary | ICD-10-CM | POA: Diagnosis not present

## 2021-03-25 DIAGNOSIS — D689 Coagulation defect, unspecified: Secondary | ICD-10-CM | POA: Diagnosis not present

## 2021-03-25 DIAGNOSIS — N186 End stage renal disease: Secondary | ICD-10-CM | POA: Diagnosis not present

## 2021-03-25 DIAGNOSIS — D631 Anemia in chronic kidney disease: Secondary | ICD-10-CM | POA: Diagnosis not present

## 2021-03-26 DIAGNOSIS — M792 Neuralgia and neuritis, unspecified: Secondary | ICD-10-CM | POA: Diagnosis not present

## 2021-03-27 DIAGNOSIS — D631 Anemia in chronic kidney disease: Secondary | ICD-10-CM | POA: Diagnosis not present

## 2021-03-27 DIAGNOSIS — Z992 Dependence on renal dialysis: Secondary | ICD-10-CM | POA: Diagnosis not present

## 2021-03-27 DIAGNOSIS — N186 End stage renal disease: Secondary | ICD-10-CM | POA: Diagnosis not present

## 2021-03-27 DIAGNOSIS — N2581 Secondary hyperparathyroidism of renal origin: Secondary | ICD-10-CM | POA: Diagnosis not present

## 2021-03-27 DIAGNOSIS — D689 Coagulation defect, unspecified: Secondary | ICD-10-CM | POA: Diagnosis not present

## 2021-03-29 DIAGNOSIS — D689 Coagulation defect, unspecified: Secondary | ICD-10-CM | POA: Diagnosis not present

## 2021-03-29 DIAGNOSIS — D631 Anemia in chronic kidney disease: Secondary | ICD-10-CM | POA: Diagnosis not present

## 2021-03-29 DIAGNOSIS — N2581 Secondary hyperparathyroidism of renal origin: Secondary | ICD-10-CM | POA: Diagnosis not present

## 2021-03-29 DIAGNOSIS — Z992 Dependence on renal dialysis: Secondary | ICD-10-CM | POA: Diagnosis not present

## 2021-03-29 DIAGNOSIS — N186 End stage renal disease: Secondary | ICD-10-CM | POA: Diagnosis not present

## 2021-04-01 DIAGNOSIS — Z992 Dependence on renal dialysis: Secondary | ICD-10-CM | POA: Diagnosis not present

## 2021-04-01 DIAGNOSIS — N2581 Secondary hyperparathyroidism of renal origin: Secondary | ICD-10-CM | POA: Diagnosis not present

## 2021-04-01 DIAGNOSIS — N186 End stage renal disease: Secondary | ICD-10-CM | POA: Diagnosis not present

## 2021-04-01 DIAGNOSIS — L299 Pruritus, unspecified: Secondary | ICD-10-CM | POA: Diagnosis not present

## 2021-04-01 DIAGNOSIS — D689 Coagulation defect, unspecified: Secondary | ICD-10-CM | POA: Diagnosis not present

## 2021-04-03 DIAGNOSIS — L299 Pruritus, unspecified: Secondary | ICD-10-CM | POA: Diagnosis not present

## 2021-04-03 DIAGNOSIS — D689 Coagulation defect, unspecified: Secondary | ICD-10-CM | POA: Diagnosis not present

## 2021-04-03 DIAGNOSIS — N2581 Secondary hyperparathyroidism of renal origin: Secondary | ICD-10-CM | POA: Diagnosis not present

## 2021-04-03 DIAGNOSIS — Z992 Dependence on renal dialysis: Secondary | ICD-10-CM | POA: Diagnosis not present

## 2021-04-03 DIAGNOSIS — N186 End stage renal disease: Secondary | ICD-10-CM | POA: Diagnosis not present

## 2021-04-05 DIAGNOSIS — D689 Coagulation defect, unspecified: Secondary | ICD-10-CM | POA: Diagnosis not present

## 2021-04-05 DIAGNOSIS — N186 End stage renal disease: Secondary | ICD-10-CM | POA: Diagnosis not present

## 2021-04-05 DIAGNOSIS — N2581 Secondary hyperparathyroidism of renal origin: Secondary | ICD-10-CM | POA: Diagnosis not present

## 2021-04-05 DIAGNOSIS — Z992 Dependence on renal dialysis: Secondary | ICD-10-CM | POA: Diagnosis not present

## 2021-04-05 DIAGNOSIS — L299 Pruritus, unspecified: Secondary | ICD-10-CM | POA: Diagnosis not present

## 2021-04-08 DIAGNOSIS — D689 Coagulation defect, unspecified: Secondary | ICD-10-CM | POA: Diagnosis not present

## 2021-04-08 DIAGNOSIS — E1129 Type 2 diabetes mellitus with other diabetic kidney complication: Secondary | ICD-10-CM | POA: Diagnosis not present

## 2021-04-08 DIAGNOSIS — N186 End stage renal disease: Secondary | ICD-10-CM | POA: Diagnosis not present

## 2021-04-08 DIAGNOSIS — D631 Anemia in chronic kidney disease: Secondary | ICD-10-CM | POA: Diagnosis not present

## 2021-04-08 DIAGNOSIS — Z992 Dependence on renal dialysis: Secondary | ICD-10-CM | POA: Diagnosis not present

## 2021-04-08 DIAGNOSIS — L299 Pruritus, unspecified: Secondary | ICD-10-CM | POA: Diagnosis not present

## 2021-04-08 DIAGNOSIS — N2581 Secondary hyperparathyroidism of renal origin: Secondary | ICD-10-CM | POA: Diagnosis not present

## 2021-04-10 ENCOUNTER — Telehealth: Payer: Medicare HMO

## 2021-04-10 ENCOUNTER — Telehealth: Payer: Self-pay

## 2021-04-10 DIAGNOSIS — L299 Pruritus, unspecified: Secondary | ICD-10-CM | POA: Diagnosis not present

## 2021-04-10 DIAGNOSIS — Z992 Dependence on renal dialysis: Secondary | ICD-10-CM | POA: Diagnosis not present

## 2021-04-10 DIAGNOSIS — N186 End stage renal disease: Secondary | ICD-10-CM | POA: Diagnosis not present

## 2021-04-10 DIAGNOSIS — D631 Anemia in chronic kidney disease: Secondary | ICD-10-CM | POA: Diagnosis not present

## 2021-04-10 DIAGNOSIS — D689 Coagulation defect, unspecified: Secondary | ICD-10-CM | POA: Diagnosis not present

## 2021-04-10 DIAGNOSIS — E1129 Type 2 diabetes mellitus with other diabetic kidney complication: Secondary | ICD-10-CM | POA: Diagnosis not present

## 2021-04-10 DIAGNOSIS — N2581 Secondary hyperparathyroidism of renal origin: Secondary | ICD-10-CM | POA: Diagnosis not present

## 2021-04-10 NOTE — Telephone Encounter (Signed)
°  Care Management   Follow Up Note   04/10/2021 Name: [name] [name] MRN: [id number] DOB: 1955-09-01   Referred by: Midge Minium, MD Reason for referral : Chronic Care Management (RNCM: Initial Outreach Chronic Care Management and coordination needs-unsuccessful 2nd attempt)   A second unsuccessful telephone outreach was attempted today. The patient was referred to the case management team for assistance with care management and care coordination.   Follow Up Plan: The care management team will reach out to the patient again over the next 30 days.   Peter Garter RN, BSN,CCM, CDE Care Management Coordinator [place] [phone], Mobile [phone]

## 2021-04-12 DIAGNOSIS — D689 Coagulation defect, unspecified: Secondary | ICD-10-CM | POA: Diagnosis not present

## 2021-04-12 DIAGNOSIS — E1129 Type 2 diabetes mellitus with other diabetic kidney complication: Secondary | ICD-10-CM | POA: Diagnosis not present

## 2021-04-12 DIAGNOSIS — Z992 Dependence on renal dialysis: Secondary | ICD-10-CM | POA: Diagnosis not present

## 2021-04-12 DIAGNOSIS — N2581 Secondary hyperparathyroidism of renal origin: Secondary | ICD-10-CM | POA: Diagnosis not present

## 2021-04-12 DIAGNOSIS — N186 End stage renal disease: Secondary | ICD-10-CM | POA: Diagnosis not present

## 2021-04-12 DIAGNOSIS — D631 Anemia in chronic kidney disease: Secondary | ICD-10-CM | POA: Diagnosis not present

## 2021-04-12 DIAGNOSIS — L299 Pruritus, unspecified: Secondary | ICD-10-CM | POA: Diagnosis not present

## 2021-04-15 DIAGNOSIS — L299 Pruritus, unspecified: Secondary | ICD-10-CM | POA: Diagnosis not present

## 2021-04-15 DIAGNOSIS — N2581 Secondary hyperparathyroidism of renal origin: Secondary | ICD-10-CM | POA: Diagnosis not present

## 2021-04-15 DIAGNOSIS — Z992 Dependence on renal dialysis: Secondary | ICD-10-CM | POA: Diagnosis not present

## 2021-04-15 DIAGNOSIS — D689 Coagulation defect, unspecified: Secondary | ICD-10-CM | POA: Diagnosis not present

## 2021-04-15 DIAGNOSIS — D509 Iron deficiency anemia, unspecified: Secondary | ICD-10-CM | POA: Diagnosis not present

## 2021-04-15 DIAGNOSIS — N186 End stage renal disease: Secondary | ICD-10-CM | POA: Diagnosis not present

## 2021-04-17 DIAGNOSIS — N2581 Secondary hyperparathyroidism of renal origin: Secondary | ICD-10-CM | POA: Diagnosis not present

## 2021-04-17 DIAGNOSIS — D509 Iron deficiency anemia, unspecified: Secondary | ICD-10-CM | POA: Diagnosis not present

## 2021-04-17 DIAGNOSIS — Z992 Dependence on renal dialysis: Secondary | ICD-10-CM | POA: Diagnosis not present

## 2021-04-17 DIAGNOSIS — L299 Pruritus, unspecified: Secondary | ICD-10-CM | POA: Diagnosis not present

## 2021-04-17 DIAGNOSIS — N186 End stage renal disease: Secondary | ICD-10-CM | POA: Diagnosis not present

## 2021-04-17 DIAGNOSIS — D689 Coagulation defect, unspecified: Secondary | ICD-10-CM | POA: Diagnosis not present

## 2021-04-18 ENCOUNTER — Telehealth: Payer: Self-pay | Admitting: *Deleted

## 2021-04-18 NOTE — Chronic Care Management (AMB) (Signed)
°  Care Management   Note  04/18/2021 Name: [name] [name] MRN: [id number] DOB: 09/23/1955  [name] [name] is a 66 y.o. year old female who is a primary care patient of Birdie Riddle, [name], MD and is actively engaged with the care management team. I reached out to Luellen Pucker by phone today to assist with re-scheduling an initial visit with the RN Case Manager  Follow up plan: Unsuccessful telephone outreach attempt made. A HIPAA compliant phone message was left for the patient providing contact information and requesting a return call.   Julian Hy, [place] [facility]  Direct Dial: [phone]

## 2021-04-19 DIAGNOSIS — D689 Coagulation defect, unspecified: Secondary | ICD-10-CM | POA: Diagnosis not present

## 2021-04-19 DIAGNOSIS — Z992 Dependence on renal dialysis: Secondary | ICD-10-CM | POA: Diagnosis not present

## 2021-04-19 DIAGNOSIS — D509 Iron deficiency anemia, unspecified: Secondary | ICD-10-CM | POA: Diagnosis not present

## 2021-04-19 DIAGNOSIS — N186 End stage renal disease: Secondary | ICD-10-CM | POA: Diagnosis not present

## 2021-04-19 DIAGNOSIS — L299 Pruritus, unspecified: Secondary | ICD-10-CM | POA: Diagnosis not present

## 2021-04-19 DIAGNOSIS — N2581 Secondary hyperparathyroidism of renal origin: Secondary | ICD-10-CM | POA: Diagnosis not present

## 2021-04-22 DIAGNOSIS — N2581 Secondary hyperparathyroidism of renal origin: Secondary | ICD-10-CM | POA: Diagnosis not present

## 2021-04-22 DIAGNOSIS — Z992 Dependence on renal dialysis: Secondary | ICD-10-CM | POA: Diagnosis not present

## 2021-04-22 DIAGNOSIS — D509 Iron deficiency anemia, unspecified: Secondary | ICD-10-CM | POA: Diagnosis not present

## 2021-04-22 DIAGNOSIS — N186 End stage renal disease: Secondary | ICD-10-CM | POA: Diagnosis not present

## 2021-04-22 DIAGNOSIS — D689 Coagulation defect, unspecified: Secondary | ICD-10-CM | POA: Diagnosis not present

## 2021-04-23 DIAGNOSIS — Z992 Dependence on renal dialysis: Secondary | ICD-10-CM | POA: Diagnosis not present

## 2021-04-23 DIAGNOSIS — I150 Renovascular hypertension: Secondary | ICD-10-CM | POA: Diagnosis not present

## 2021-04-23 DIAGNOSIS — N186 End stage renal disease: Secondary | ICD-10-CM | POA: Diagnosis not present

## 2021-04-24 DIAGNOSIS — N186 End stage renal disease: Secondary | ICD-10-CM | POA: Diagnosis not present

## 2021-04-24 DIAGNOSIS — L299 Pruritus, unspecified: Secondary | ICD-10-CM | POA: Diagnosis not present

## 2021-04-24 DIAGNOSIS — D509 Iron deficiency anemia, unspecified: Secondary | ICD-10-CM | POA: Diagnosis not present

## 2021-04-24 DIAGNOSIS — R0602 Shortness of breath: Secondary | ICD-10-CM | POA: Diagnosis not present

## 2021-04-24 DIAGNOSIS — D631 Anemia in chronic kidney disease: Secondary | ICD-10-CM | POA: Diagnosis not present

## 2021-04-24 DIAGNOSIS — Z992 Dependence on renal dialysis: Secondary | ICD-10-CM | POA: Diagnosis not present

## 2021-04-24 DIAGNOSIS — N2581 Secondary hyperparathyroidism of renal origin: Secondary | ICD-10-CM | POA: Diagnosis not present

## 2021-04-24 DIAGNOSIS — D689 Coagulation defect, unspecified: Secondary | ICD-10-CM | POA: Diagnosis not present

## 2021-04-26 DIAGNOSIS — L299 Pruritus, unspecified: Secondary | ICD-10-CM | POA: Diagnosis not present

## 2021-04-26 DIAGNOSIS — D631 Anemia in chronic kidney disease: Secondary | ICD-10-CM | POA: Diagnosis not present

## 2021-04-26 DIAGNOSIS — D689 Coagulation defect, unspecified: Secondary | ICD-10-CM | POA: Diagnosis not present

## 2021-04-26 DIAGNOSIS — N2581 Secondary hyperparathyroidism of renal origin: Secondary | ICD-10-CM | POA: Diagnosis not present

## 2021-04-26 DIAGNOSIS — D509 Iron deficiency anemia, unspecified: Secondary | ICD-10-CM | POA: Diagnosis not present

## 2021-04-26 DIAGNOSIS — Z992 Dependence on renal dialysis: Secondary | ICD-10-CM | POA: Diagnosis not present

## 2021-04-26 DIAGNOSIS — N186 End stage renal disease: Secondary | ICD-10-CM | POA: Diagnosis not present

## 2021-04-29 DIAGNOSIS — Z992 Dependence on renal dialysis: Secondary | ICD-10-CM | POA: Diagnosis not present

## 2021-04-29 DIAGNOSIS — T782XXA Anaphylactic shock, unspecified, initial encounter: Secondary | ICD-10-CM | POA: Diagnosis not present

## 2021-04-29 DIAGNOSIS — N186 End stage renal disease: Secondary | ICD-10-CM | POA: Diagnosis not present

## 2021-04-29 DIAGNOSIS — D689 Coagulation defect, unspecified: Secondary | ICD-10-CM | POA: Diagnosis not present

## 2021-04-29 DIAGNOSIS — N2581 Secondary hyperparathyroidism of renal origin: Secondary | ICD-10-CM | POA: Diagnosis not present

## 2021-04-29 DIAGNOSIS — L299 Pruritus, unspecified: Secondary | ICD-10-CM | POA: Diagnosis not present

## 2021-05-01 DIAGNOSIS — N2581 Secondary hyperparathyroidism of renal origin: Secondary | ICD-10-CM | POA: Diagnosis not present

## 2021-05-01 DIAGNOSIS — L299 Pruritus, unspecified: Secondary | ICD-10-CM | POA: Diagnosis not present

## 2021-05-01 DIAGNOSIS — D689 Coagulation defect, unspecified: Secondary | ICD-10-CM | POA: Diagnosis not present

## 2021-05-01 DIAGNOSIS — Z992 Dependence on renal dialysis: Secondary | ICD-10-CM | POA: Diagnosis not present

## 2021-05-01 DIAGNOSIS — N186 End stage renal disease: Secondary | ICD-10-CM | POA: Diagnosis not present

## 2021-05-01 DIAGNOSIS — T782XXA Anaphylactic shock, unspecified, initial encounter: Secondary | ICD-10-CM | POA: Diagnosis not present

## 2021-05-02 ENCOUNTER — Encounter: Payer: Self-pay | Admitting: Family Medicine

## 2021-05-02 ENCOUNTER — Ambulatory Visit (INDEPENDENT_AMBULATORY_CARE_PROVIDER_SITE_OTHER): Payer: Medicare HMO | Admitting: Family Medicine

## 2021-05-02 ENCOUNTER — Telehealth: Payer: Self-pay

## 2021-05-02 VITALS — BP 134/82 | HR 100 | Temp 98.4°F | Resp 17 | Ht 66.0 in | Wt 194.8 lb

## 2021-05-02 DIAGNOSIS — E1169 Type 2 diabetes mellitus with other specified complication: Secondary | ICD-10-CM

## 2021-05-02 DIAGNOSIS — Z1231 Encounter for screening mammogram for malignant neoplasm of breast: Secondary | ICD-10-CM | POA: Diagnosis not present

## 2021-05-02 DIAGNOSIS — R011 Cardiac murmur, unspecified: Secondary | ICD-10-CM

## 2021-05-02 DIAGNOSIS — Z992 Dependence on renal dialysis: Secondary | ICD-10-CM

## 2021-05-02 DIAGNOSIS — E785 Hyperlipidemia, unspecified: Secondary | ICD-10-CM | POA: Diagnosis not present

## 2021-05-02 DIAGNOSIS — E1122 Type 2 diabetes mellitus with diabetic chronic kidney disease: Secondary | ICD-10-CM

## 2021-05-02 DIAGNOSIS — Z0000 Encounter for general adult medical examination without abnormal findings: Secondary | ICD-10-CM | POA: Diagnosis not present

## 2021-05-02 DIAGNOSIS — J9611 Chronic respiratory failure with hypoxia: Secondary | ICD-10-CM

## 2021-05-02 DIAGNOSIS — Z1211 Encounter for screening for malignant neoplasm of colon: Secondary | ICD-10-CM | POA: Diagnosis not present

## 2021-05-02 DIAGNOSIS — N186 End stage renal disease: Secondary | ICD-10-CM | POA: Diagnosis not present

## 2021-05-02 LAB — CBC WITH DIFFERENTIAL/PLATELET
Basophils Absolute: 0 10*3/uL (ref 0.0–0.1)
Basophils Relative: 0.5 % (ref 0.0–3.0)
Eosinophils Absolute: 0.2 10*3/uL (ref 0.0–0.7)
Eosinophils Relative: 2.4 % (ref 0.0–5.0)
HCT: 33.6 % — ABNORMAL LOW (ref 36.0–46.0)
Hemoglobin: 10.9 g/dL — ABNORMAL LOW (ref 12.0–15.0)
Lymphocytes Relative: 15.1 % (ref 12.0–46.0)
Lymphs Abs: 1.5 10*3/uL (ref 0.7–4.0)
MCHC: 32.4 g/dL (ref 30.0–36.0)
MCV: 94.1 fl (ref 78.0–100.0)
Monocytes Absolute: 0.7 10*3/uL (ref 0.1–1.0)
Monocytes Relative: 7 % (ref 3.0–12.0)
Neutro Abs: 7.5 10*3/uL (ref 1.4–7.7)
Neutrophils Relative %: 75 % (ref 43.0–77.0)
Platelets: 185 10*3/uL (ref 150.0–400.0)
RBC: 3.57 Mil/uL — ABNORMAL LOW (ref 3.87–5.11)
RDW: 18.4 % — ABNORMAL HIGH (ref 11.5–15.5)
WBC: 10.1 10*3/uL (ref 4.0–10.5)

## 2021-05-02 LAB — HEPATIC FUNCTION PANEL
ALT: 10 U/L (ref 0–35)
AST: 12 U/L (ref 0–37)
Albumin: 4 g/dL (ref 3.5–5.2)
Alkaline Phosphatase: 96 U/L (ref 39–117)
Bilirubin, Direct: 0.1 mg/dL (ref 0.0–0.3)
Total Bilirubin: 0.4 mg/dL (ref 0.2–1.2)
Total Protein: 7.4 g/dL (ref 6.0–8.3)

## 2021-05-02 LAB — BASIC METABOLIC PANEL
BUN: 30 mg/dL — ABNORMAL HIGH (ref 6–23)
CO2: 33 mEq/L — ABNORMAL HIGH (ref 19–32)
Calcium: 9.4 mg/dL (ref 8.4–10.5)
Chloride: 93 mEq/L — ABNORMAL LOW (ref 96–112)
Creatinine, Ser: 7.93 mg/dL (ref 0.40–1.20)
GFR: 5.01 mL/min — CL (ref >60.00)
Glucose, Bld: 146 mg/dL — ABNORMAL HIGH (ref 70–99)
Potassium: 3.8 mEq/L (ref 3.5–5.1)
Sodium: 139 mEq/L (ref 135–145)

## 2021-05-02 LAB — TSH: TSH: 1.07 u[IU]/mL (ref 0.35–5.50)

## 2021-05-02 LAB — LDL CHOLESTEROL, DIRECT: Direct LDL: 156 mg/dL

## 2021-05-02 LAB — HEMOGLOBIN A1C: Hgb A1c MFr Bld: 6.1 % (ref 4.6–6.5)

## 2021-05-02 LAB — LIPID PANEL
Cholesterol: 267 mg/dL — ABNORMAL HIGH (ref 0–200)
HDL: 37.6 mg/dL — ABNORMAL LOW
NonHDL: 229.75
Total CHOL/HDL Ratio: 7
Triglycerides: 317 mg/dL — ABNORMAL HIGH (ref 0.0–149.0)
VLDL: 63.4 mg/dL — ABNORMAL HIGH (ref 0.0–40.0)

## 2021-05-02 LAB — VITAMIN D 25 HYDROXY (VIT D DEFICIENCY, FRACTURES): VITD: 45.7 ng/mL (ref 30.00–100.00)

## 2021-05-02 NOTE — Telephone Encounter (Signed)
Critical lab received regarding creatinine.  Lab Results  Component Value Date   CREATININE 7.93 ([place]) 05/02/2021   Patient with known end-stage renal disease on hemodialysis, last creatinine 1 year ago 7.58, previously 10.85 in March 2022.  Will defer to PCP.

## 2021-05-02 NOTE — Telephone Encounter (Signed)
Critical lab call  Creatnin 7.93  GFR 5.01

## 2021-05-02 NOTE — Progress Notes (Signed)
Subjective:    Patient ID: [name] [name], female    DOB: June 01, 1955, 66 y.o.   MRN: [id number]  HPI CPE- due for mammo, colonoscopy, eye exam.  UTD on Tdap, PNA.  Pt reports feeling good  Patient Care Team    Relationship Specialty Notifications Start End  Midge Minium, MD PCP - General Family Medicine  06/28/15   Ladene Artist, MD Consulting Physician Gastroenterology  06/28/15   Hurman Horn, MD Consulting Physician Ophthalmology  06/28/15   Everlene Farrier, MD Consulting Physician Obstetrics and Gynecology  06/28/15   Deterding, Jeneen Rinks, MD Consulting Physician Nephrology  06/28/15   Nahser, Wonda Cheng, MD Consulting Physician Cardiology  06/28/15   [name], [facility] Referring Physician Nephrology  10/02/17    Comment: M-W-Fri  Waynetta Sandy, MD Consulting Physician Vascular Surgery  12/07/18   Dimitri Ped, [place] [facility]   03/11/21    Comment: [phone]    Health Maintenance  Topic Date Due   Zoster Vaccines- Shingrix (1 of 2) Never done   COLONOSCOPY (Pts 45-69yrs Insurance coverage will need to be confirmed)  06/20/2019   OPHTHALMOLOGY EXAM  02/22/2020   COVID-19 Vaccine (4 - Booster for Pfizer series) 02/24/2020   HEMOGLOBIN A1C  01/16/2021   MAMMOGRAM  05/09/2021   FOOT EXAM  07/17/2021   TETANUS/TDAP  02/02/2029   INFLUENZA VACCINE  Completed   Hepatitis C Screening  Completed   HIV Screening  Completed   HPV VACCINES  Aged Out     Review of Systems Patient reports no hearing changes, adenopathy,fever, weight change,  persistant/recurrent hoarseness , swallowing issues, chest pain, palpitations, edema, persistant/recurrent cough, hemoptysis, gastrointestinal bleeding (melena, rectal bleeding), abdominal pain, significant heartburn, GU symptoms (dysuria, hematuria, incontinence), Gyn symptoms (abnormal  bleeding, pain),  syncope, focal weakness, memory loss, numbness & tingling, skin/hair/nail  changes, abnormal bruising or bleeding, anxiety, or depression.   + chronic SOB + vision blurry + constipation  This visit occurred during the SARS-CoV-2 public health emergency.  Safety protocols were in place, including screening questions prior to the visit, additional usage of staff PPE, and extensive cleaning of exam room while observing appropriate contact time as indicated for disinfecting solutions.      Objective:   Physical Exam General Appearance:    Alert, cooperative, no distress, appears stated age  Head:    Normocephalic, without obvious abnormality, atraumatic  Eyes:    PERRL, conjunctiva/corneas clear, EOM's intact both eyes  Ears:    Normal TM's and external ear canals, both ears  Nose:   Nares normal, septum midline, mucosa normal, no drainage    or sinus tenderness  Throat:   Lips, mucosa, and tongue normal; teeth and gums normal  Neck:   Supple, symmetrical, trachea midline, no adenopathy;    Thyroid: no enlargement/tenderness/nodules  Back:     Symmetric, no curvature, ROM normal, no CVA tenderness  Lungs:     Clear to auscultation bilaterally, respirations unlabored  Chest Wall:    No tenderness or deformity   Heart:    Regular rate and rhythm, II/VI SEM  Breast Exam:    Deferred to mammo  Abdomen:     Soft, non-tender, bowel sounds active all four quadrants,    no masses, no organomegaly  Genitalia:    Deferred to GYN  Rectal:    Extremities:   Extremities normal, atraumatic, no cyanosis or edema  Pulses:   2+ and symmetric all  extremities  Skin:   Skin color, texture, turgor normal, no rashes or lesions  Lymph nodes:   Cervical, supraclavicular, and axillary nodes normal  Neurologic:   CNII-XII intact, normal strength, sensation and reflexes    throughout          Assessment & Plan:

## 2021-05-02 NOTE — Patient Instructions (Addendum)
Follow up in 3-4 months to recheck diabetes We'll notify you of your lab results and make any changes if needed We'll call you with your ophthalmology referral, GI referral, pulmonary referral, mammogram, and ECHO Continue to work on healthy diet and regular exercise Add OTC Miralax or similar to help w/ constipation Call with any questions or concerns Stay Safe!  Stay Healthy! Happy Valentine's Day!!!

## 2021-05-03 DIAGNOSIS — T782XXA Anaphylactic shock, unspecified, initial encounter: Secondary | ICD-10-CM | POA: Diagnosis not present

## 2021-05-03 DIAGNOSIS — Z992 Dependence on renal dialysis: Secondary | ICD-10-CM | POA: Diagnosis not present

## 2021-05-03 DIAGNOSIS — L299 Pruritus, unspecified: Secondary | ICD-10-CM | POA: Diagnosis not present

## 2021-05-03 DIAGNOSIS — N2581 Secondary hyperparathyroidism of renal origin: Secondary | ICD-10-CM | POA: Diagnosis not present

## 2021-05-03 DIAGNOSIS — N186 End stage renal disease: Secondary | ICD-10-CM | POA: Diagnosis not present

## 2021-05-03 DIAGNOSIS — D689 Coagulation defect, unspecified: Secondary | ICD-10-CM | POA: Diagnosis not present

## 2021-05-03 NOTE — Telephone Encounter (Signed)
Pt on dialysis.  No further action needed

## 2021-05-06 DIAGNOSIS — D631 Anemia in chronic kidney disease: Secondary | ICD-10-CM | POA: Diagnosis not present

## 2021-05-06 DIAGNOSIS — Z992 Dependence on renal dialysis: Secondary | ICD-10-CM | POA: Diagnosis not present

## 2021-05-06 DIAGNOSIS — D689 Coagulation defect, unspecified: Secondary | ICD-10-CM | POA: Diagnosis not present

## 2021-05-06 DIAGNOSIS — N2581 Secondary hyperparathyroidism of renal origin: Secondary | ICD-10-CM | POA: Diagnosis not present

## 2021-05-06 DIAGNOSIS — L299 Pruritus, unspecified: Secondary | ICD-10-CM | POA: Diagnosis not present

## 2021-05-06 DIAGNOSIS — N186 End stage renal disease: Secondary | ICD-10-CM | POA: Diagnosis not present

## 2021-05-08 DIAGNOSIS — L299 Pruritus, unspecified: Secondary | ICD-10-CM | POA: Diagnosis not present

## 2021-05-08 DIAGNOSIS — N2581 Secondary hyperparathyroidism of renal origin: Secondary | ICD-10-CM | POA: Diagnosis not present

## 2021-05-08 DIAGNOSIS — N186 End stage renal disease: Secondary | ICD-10-CM | POA: Diagnosis not present

## 2021-05-08 DIAGNOSIS — Z992 Dependence on renal dialysis: Secondary | ICD-10-CM | POA: Diagnosis not present

## 2021-05-08 DIAGNOSIS — D689 Coagulation defect, unspecified: Secondary | ICD-10-CM | POA: Diagnosis not present

## 2021-05-08 DIAGNOSIS — D631 Anemia in chronic kidney disease: Secondary | ICD-10-CM | POA: Diagnosis not present

## 2021-05-09 ENCOUNTER — Ambulatory Visit (HOSPITAL_COMMUNITY)
Admission: RE | Admit: 2021-05-09 | Discharge: 2021-05-09 | Disposition: A | Discharge status: Home / Self Care | Payer: Medicare HMO | Source: Ambulatory Visit | Attending: Family Medicine | Admitting: Family Medicine

## 2021-05-09 ENCOUNTER — Other Ambulatory Visit: Payer: Self-pay

## 2021-05-09 DIAGNOSIS — E785 Hyperlipidemia, unspecified: Secondary | ICD-10-CM | POA: Insufficient documentation

## 2021-05-09 DIAGNOSIS — I119 Hypertensive heart disease without heart failure: Secondary | ICD-10-CM | POA: Diagnosis not present

## 2021-05-09 DIAGNOSIS — R011 Cardiac murmur, unspecified: Secondary | ICD-10-CM | POA: Diagnosis not present

## 2021-05-09 DIAGNOSIS — I350 Nonrheumatic aortic (valve) stenosis: Secondary | ICD-10-CM | POA: Insufficient documentation

## 2021-05-09 LAB — ECHOCARDIOGRAM COMPLETE
AR max vel: 2.42 cm2
AV Area VTI: 2.89 cm2
AV Area mean vel: 2.25 cm2
AV Mean grad: 11.5 mmHg
AV Peak grad: 24.3 mmHg
Ao pk vel: 2.47 m/s
Area-P 1/2: 2.99 cm2
S' Lateral: 3.2 cm

## 2021-05-10 ENCOUNTER — Telehealth: Payer: Self-pay

## 2021-05-10 DIAGNOSIS — D631 Anemia in chronic kidney disease: Secondary | ICD-10-CM | POA: Diagnosis not present

## 2021-05-10 DIAGNOSIS — Z992 Dependence on renal dialysis: Secondary | ICD-10-CM | POA: Diagnosis not present

## 2021-05-10 DIAGNOSIS — L299 Pruritus, unspecified: Secondary | ICD-10-CM | POA: Diagnosis not present

## 2021-05-10 DIAGNOSIS — N186 End stage renal disease: Secondary | ICD-10-CM | POA: Diagnosis not present

## 2021-05-10 DIAGNOSIS — N2581 Secondary hyperparathyroidism of renal origin: Secondary | ICD-10-CM | POA: Diagnosis not present

## 2021-05-10 DIAGNOSIS — D689 Coagulation defect, unspecified: Secondary | ICD-10-CM | POA: Diagnosis not present

## 2021-05-10 NOTE — Telephone Encounter (Signed)
-----   Message from Midge Minium, MD sent at 05/10/2021  7:43 AM EST ----- You have great squeezing function of the heart.  You have very mild impaired relaxation of the heart muscle.  You do have calcification of the aortic valve which is likely the cause of the murmur we hear.  No changes at this time but we will refer back to Cardiology if you develop symptoms or things change/worsen

## 2021-05-10 NOTE — Telephone Encounter (Signed)
Pt's daughter aware of results

## 2021-05-13 ENCOUNTER — Other Ambulatory Visit: Payer: Self-pay | Admitting: Family Medicine

## 2021-05-13 DIAGNOSIS — L299 Pruritus, unspecified: Secondary | ICD-10-CM | POA: Diagnosis not present

## 2021-05-13 DIAGNOSIS — D689 Coagulation defect, unspecified: Secondary | ICD-10-CM | POA: Diagnosis not present

## 2021-05-13 DIAGNOSIS — N186 End stage renal disease: Secondary | ICD-10-CM | POA: Diagnosis not present

## 2021-05-13 DIAGNOSIS — Z992 Dependence on renal dialysis: Secondary | ICD-10-CM | POA: Diagnosis not present

## 2021-05-13 DIAGNOSIS — N2581 Secondary hyperparathyroidism of renal origin: Secondary | ICD-10-CM | POA: Diagnosis not present

## 2021-05-14 NOTE — Chronic Care Management (AMB) (Signed)
°  Care Management   Note  05/14/2021 Name: [name] MRN: [id number] DOB: [birth year]  [name] [name] is a 66 y.o. year old female who is a primary care patient of Birdie Riddle, [name], MD and is actively engaged with the care management team. I reached out to Luellen Pucker by phone today to assist with re-scheduling an initial visit with the RN Case Manager  Follow up plan: 2nd Unsuccessful telephone outreach attempt made. A HIPAA compliant phone message was left for the patient providing contact information and requesting a return call.   Julian Hy, [facility]  Direct Dial: [phone]

## 2021-05-15 DIAGNOSIS — L299 Pruritus, unspecified: Secondary | ICD-10-CM | POA: Diagnosis not present

## 2021-05-15 DIAGNOSIS — N2581 Secondary hyperparathyroidism of renal origin: Secondary | ICD-10-CM | POA: Diagnosis not present

## 2021-05-15 DIAGNOSIS — Z992 Dependence on renal dialysis: Secondary | ICD-10-CM | POA: Diagnosis not present

## 2021-05-15 DIAGNOSIS — D689 Coagulation defect, unspecified: Secondary | ICD-10-CM | POA: Diagnosis not present

## 2021-05-15 DIAGNOSIS — N186 End stage renal disease: Secondary | ICD-10-CM | POA: Diagnosis not present

## 2021-05-17 DIAGNOSIS — N2581 Secondary hyperparathyroidism of renal origin: Secondary | ICD-10-CM | POA: Diagnosis not present

## 2021-05-17 DIAGNOSIS — Z992 Dependence on renal dialysis: Secondary | ICD-10-CM | POA: Diagnosis not present

## 2021-05-17 DIAGNOSIS — N186 End stage renal disease: Secondary | ICD-10-CM | POA: Diagnosis not present

## 2021-05-17 DIAGNOSIS — L299 Pruritus, unspecified: Secondary | ICD-10-CM | POA: Diagnosis not present

## 2021-05-17 DIAGNOSIS — D689 Coagulation defect, unspecified: Secondary | ICD-10-CM | POA: Diagnosis not present

## 2021-05-20 DIAGNOSIS — Z992 Dependence on renal dialysis: Secondary | ICD-10-CM | POA: Diagnosis not present

## 2021-05-20 DIAGNOSIS — D689 Coagulation defect, unspecified: Secondary | ICD-10-CM | POA: Diagnosis not present

## 2021-05-20 DIAGNOSIS — N2581 Secondary hyperparathyroidism of renal origin: Secondary | ICD-10-CM | POA: Diagnosis not present

## 2021-05-20 DIAGNOSIS — N186 End stage renal disease: Secondary | ICD-10-CM | POA: Diagnosis not present

## 2021-05-21 DIAGNOSIS — I150 Renovascular hypertension: Secondary | ICD-10-CM | POA: Diagnosis not present

## 2021-05-21 DIAGNOSIS — N186 End stage renal disease: Secondary | ICD-10-CM | POA: Diagnosis not present

## 2021-05-21 DIAGNOSIS — Z992 Dependence on renal dialysis: Secondary | ICD-10-CM | POA: Diagnosis not present

## 2021-05-22 DIAGNOSIS — N2581 Secondary hyperparathyroidism of renal origin: Secondary | ICD-10-CM | POA: Diagnosis not present

## 2021-05-22 DIAGNOSIS — Z992 Dependence on renal dialysis: Secondary | ICD-10-CM | POA: Diagnosis not present

## 2021-05-22 DIAGNOSIS — D689 Coagulation defect, unspecified: Secondary | ICD-10-CM | POA: Diagnosis not present

## 2021-05-22 DIAGNOSIS — R0602 Shortness of breath: Secondary | ICD-10-CM | POA: Diagnosis not present

## 2021-05-22 DIAGNOSIS — L299 Pruritus, unspecified: Secondary | ICD-10-CM | POA: Diagnosis not present

## 2021-05-22 DIAGNOSIS — N186 End stage renal disease: Secondary | ICD-10-CM | POA: Diagnosis not present

## 2021-05-23 ENCOUNTER — Telehealth: Payer: Self-pay | Admitting: *Deleted

## 2021-05-23 NOTE — Chronic Care Management (AMB) (Signed)
?  Care Management  ? ?Note ? ?05/23/2021 ?Name: [name] [name] MRN: [id number] DOB: Aug 08, 1955 ? ?[name] [name] is a 66 y.o. year old female who is a primary care patient of Birdie Riddle, [name], MD and is actively engaged with the care management team. I reached out to Luellen Pucker by phone today to assist with re-scheduling an initial visit with the RN Case Manager ? ?Follow up plan: ?Telephone appointment with care management team member scheduled for: 06/12/2021 ? ?Tyrhonda Georgiades, CCMA ?Care Guide, Embedded Care Coordination ?[facility]  Care Management  ?Direct Dial: [phone] ? ? ?

## 2021-05-23 NOTE — Chronic Care Management (AMB) (Signed)
?  Care Management  ? ?Note ? ?05/23/2021 ?Name: [name] [name] MRN: [id number] DOB: 06-20-1955 ? ?TEKEISHA HAKIM is a 66 y.o. year old female who is a primary care patient of Birdie Riddle, [name], MD and is actively engaged with the care management team. I reached out to Luellen Pucker by phone today to assist with re-scheduling an initial visit with the RN Case Manager ? ?Follow up plan: ?Unsuccessful telephone outreach attempt made. A HIPAA compliant phone message was left for the patient providing contact information and requesting a return call.  ?The care management team will reach out to the patient again over the next 7 days.  ?If patient returns call to provider office, please advise to call [place] at [phone]. ? ?Laverda Sorenson  ?Care Guide, Embedded Care Coordination ?[facility]  Care Management  ?Direct Dial: [phone] ? ?

## 2021-05-24 DIAGNOSIS — D689 Coagulation defect, unspecified: Secondary | ICD-10-CM | POA: Diagnosis not present

## 2021-05-24 DIAGNOSIS — N186 End stage renal disease: Secondary | ICD-10-CM | POA: Diagnosis not present

## 2021-05-24 DIAGNOSIS — Z992 Dependence on renal dialysis: Secondary | ICD-10-CM | POA: Diagnosis not present

## 2021-05-24 DIAGNOSIS — L299 Pruritus, unspecified: Secondary | ICD-10-CM | POA: Diagnosis not present

## 2021-05-24 DIAGNOSIS — N2581 Secondary hyperparathyroidism of renal origin: Secondary | ICD-10-CM | POA: Diagnosis not present

## 2021-05-24 NOTE — Chronic Care Management (AMB) (Signed)
?  Care Management  ? ?Note ? ?05/24/2021 ?Name: [name] [name] MRN: [id number] DOB: [birth year] ? ?[name] [name] is a 66 y.o. year old female who is a primary care patient of Birdie Riddle, [name], MD and is actively engaged with the care management team. I reached out to Luellen Pucker by phone today to assist with re-scheduling an initial visit with the RN Case Manager ? ?Follow up plan: ?Telephone appointment with care management team member scheduled for:06/12/21 ? ?Laverda Sorenson  ?Care Guide, Embedded Care Coordination ?[facility]  Care Management  ?Direct Dial: [phone] ? ?

## 2021-05-25 DIAGNOSIS — Z992 Dependence on renal dialysis: Secondary | ICD-10-CM | POA: Diagnosis not present

## 2021-05-25 DIAGNOSIS — N186 End stage renal disease: Secondary | ICD-10-CM | POA: Diagnosis not present

## 2021-05-25 DIAGNOSIS — N2581 Secondary hyperparathyroidism of renal origin: Secondary | ICD-10-CM | POA: Diagnosis not present

## 2021-05-25 DIAGNOSIS — D689 Coagulation defect, unspecified: Secondary | ICD-10-CM | POA: Diagnosis not present

## 2021-05-25 DIAGNOSIS — L299 Pruritus, unspecified: Secondary | ICD-10-CM | POA: Diagnosis not present

## 2021-05-27 DIAGNOSIS — L299 Pruritus, unspecified: Secondary | ICD-10-CM | POA: Diagnosis not present

## 2021-05-27 DIAGNOSIS — D689 Coagulation defect, unspecified: Secondary | ICD-10-CM | POA: Diagnosis not present

## 2021-05-27 DIAGNOSIS — N186 End stage renal disease: Secondary | ICD-10-CM | POA: Diagnosis not present

## 2021-05-27 DIAGNOSIS — Z992 Dependence on renal dialysis: Secondary | ICD-10-CM | POA: Diagnosis not present

## 2021-05-27 DIAGNOSIS — N2581 Secondary hyperparathyroidism of renal origin: Secondary | ICD-10-CM | POA: Diagnosis not present

## 2021-05-29 DIAGNOSIS — N186 End stage renal disease: Secondary | ICD-10-CM | POA: Diagnosis not present

## 2021-05-29 DIAGNOSIS — D689 Coagulation defect, unspecified: Secondary | ICD-10-CM | POA: Diagnosis not present

## 2021-05-29 DIAGNOSIS — N2581 Secondary hyperparathyroidism of renal origin: Secondary | ICD-10-CM | POA: Diagnosis not present

## 2021-05-29 DIAGNOSIS — L299 Pruritus, unspecified: Secondary | ICD-10-CM | POA: Diagnosis not present

## 2021-05-29 DIAGNOSIS — Z992 Dependence on renal dialysis: Secondary | ICD-10-CM | POA: Diagnosis not present

## 2021-05-31 DIAGNOSIS — D689 Coagulation defect, unspecified: Secondary | ICD-10-CM | POA: Diagnosis not present

## 2021-05-31 DIAGNOSIS — Z992 Dependence on renal dialysis: Secondary | ICD-10-CM | POA: Diagnosis not present

## 2021-05-31 DIAGNOSIS — N186 End stage renal disease: Secondary | ICD-10-CM | POA: Diagnosis not present

## 2021-05-31 DIAGNOSIS — N2581 Secondary hyperparathyroidism of renal origin: Secondary | ICD-10-CM | POA: Diagnosis not present

## 2021-05-31 DIAGNOSIS — L299 Pruritus, unspecified: Secondary | ICD-10-CM | POA: Diagnosis not present

## 2021-06-03 DIAGNOSIS — N2581 Secondary hyperparathyroidism of renal origin: Secondary | ICD-10-CM | POA: Diagnosis not present

## 2021-06-03 DIAGNOSIS — L299 Pruritus, unspecified: Secondary | ICD-10-CM | POA: Diagnosis not present

## 2021-06-03 DIAGNOSIS — Z992 Dependence on renal dialysis: Secondary | ICD-10-CM | POA: Diagnosis not present

## 2021-06-03 DIAGNOSIS — D689 Coagulation defect, unspecified: Secondary | ICD-10-CM | POA: Diagnosis not present

## 2021-06-03 DIAGNOSIS — N186 End stage renal disease: Secondary | ICD-10-CM | POA: Diagnosis not present

## 2021-06-05 DIAGNOSIS — L299 Pruritus, unspecified: Secondary | ICD-10-CM | POA: Diagnosis not present

## 2021-06-05 DIAGNOSIS — D689 Coagulation defect, unspecified: Secondary | ICD-10-CM | POA: Diagnosis not present

## 2021-06-05 DIAGNOSIS — Z992 Dependence on renal dialysis: Secondary | ICD-10-CM | POA: Diagnosis not present

## 2021-06-05 DIAGNOSIS — N2581 Secondary hyperparathyroidism of renal origin: Secondary | ICD-10-CM | POA: Diagnosis not present

## 2021-06-05 DIAGNOSIS — N186 End stage renal disease: Secondary | ICD-10-CM | POA: Diagnosis not present

## 2021-06-07 DIAGNOSIS — Z992 Dependence on renal dialysis: Secondary | ICD-10-CM | POA: Diagnosis not present

## 2021-06-07 DIAGNOSIS — L299 Pruritus, unspecified: Secondary | ICD-10-CM | POA: Diagnosis not present

## 2021-06-07 DIAGNOSIS — N186 End stage renal disease: Secondary | ICD-10-CM | POA: Diagnosis not present

## 2021-06-07 DIAGNOSIS — N2581 Secondary hyperparathyroidism of renal origin: Secondary | ICD-10-CM | POA: Diagnosis not present

## 2021-06-07 DIAGNOSIS — D689 Coagulation defect, unspecified: Secondary | ICD-10-CM | POA: Diagnosis not present

## 2021-06-10 DIAGNOSIS — N186 End stage renal disease: Secondary | ICD-10-CM | POA: Diagnosis not present

## 2021-06-10 DIAGNOSIS — L299 Pruritus, unspecified: Secondary | ICD-10-CM | POA: Diagnosis not present

## 2021-06-10 DIAGNOSIS — N2581 Secondary hyperparathyroidism of renal origin: Secondary | ICD-10-CM | POA: Diagnosis not present

## 2021-06-10 DIAGNOSIS — Z992 Dependence on renal dialysis: Secondary | ICD-10-CM | POA: Diagnosis not present

## 2021-06-10 DIAGNOSIS — D689 Coagulation defect, unspecified: Secondary | ICD-10-CM | POA: Diagnosis not present

## 2021-06-12 ENCOUNTER — Ambulatory Visit (INDEPENDENT_AMBULATORY_CARE_PROVIDER_SITE_OTHER): Payer: Medicare HMO

## 2021-06-12 DIAGNOSIS — I10 Essential (primary) hypertension: Secondary | ICD-10-CM

## 2021-06-12 DIAGNOSIS — N186 End stage renal disease: Secondary | ICD-10-CM

## 2021-06-12 DIAGNOSIS — E1169 Type 2 diabetes mellitus with other specified complication: Secondary | ICD-10-CM

## 2021-06-12 DIAGNOSIS — L299 Pruritus, unspecified: Secondary | ICD-10-CM | POA: Diagnosis not present

## 2021-06-12 DIAGNOSIS — N2581 Secondary hyperparathyroidism of renal origin: Secondary | ICD-10-CM | POA: Diagnosis not present

## 2021-06-12 DIAGNOSIS — E1122 Type 2 diabetes mellitus with diabetic chronic kidney disease: Secondary | ICD-10-CM

## 2021-06-12 DIAGNOSIS — Z992 Dependence on renal dialysis: Secondary | ICD-10-CM | POA: Diagnosis not present

## 2021-06-12 DIAGNOSIS — D689 Coagulation defect, unspecified: Secondary | ICD-10-CM | POA: Diagnosis not present

## 2021-06-12 NOTE — Addendum Note (Signed)
Addended by: Dimitri Ped on: 06/12/2021 04:58 PM ? ? Modules accepted: Orders ? ?

## 2021-06-12 NOTE — Chronic Care Management (AMB) (Signed)
?Chronic Care Management  ? ?CCM RN Visit Note ? ?06/12/2021 ?Name: [name] [name] MRN: [id number] DOB: [birth year] ? ?Subjective: ?[name] [name] is a 66 y.o. year old female who is a primary care patient of Birdie Riddle, [name], MD. The care management team was consulted for assistance with disease management and care coordination needs.   ? ?Engaged with patient by telephone for initial visit in response to provider referral for case management and/or care coordination services.  ? ?Consent to Services:  ?The patient was given the following information about Chronic Care Management services today, agreed to services, and gave verbal consent: 1. CCM service includes personalized support from designated clinical staff supervised by the primary care provider, including individualized plan of care and coordination with other care providers 2. 24/7 contact phone numbers for assistance for urgent and routine care needs. 3. Service will only be billed when office clinical staff spend 20 minutes or more in a month to coordinate care. 4. Only one practitioner may furnish and bill the service in a calendar month. 5.The patient may stop CCM services at any time (effective at the end of the month) by phone call to the office staff. 6. The patient will be responsible for cost sharing (co-pay) of up to 20% of the service fee (after annual deductible is met). Patient agreed to services and consent obtained. ? ?Patient agreed to services and verbal consent obtained.  ? ?Assessment: Review of patient past medical history, allergies, medications, health status, including review of consultants reports, laboratory and other test data, was performed as part of comprehensive evaluation and provision of chronic care management services.  ? ?SDOH (Social Determinants of Health) assessments and interventions performed:  ?SDOH Interventions   ? ?Flowsheet Row Most Recent Value  ?SDOH Interventions   ?Food Insecurity Interventions  Intervention Not Indicated  ?Financial Strain Interventions Intervention Not Indicated  ?Housing Interventions Other (Comment)  [rent high referred to care guide for affordable housing resources]  ?Stress Interventions Intervention Not Indicated  ?Transportation Interventions Intervention Not Indicated  ? ?  ?  ? ?[place] ? ?Allergies  ?Allergen Reactions  ? Lisinopril Shortness Of Breath, Swelling and Other (See Comments)  ?  Throat irritation also. Patient takes losartan and tolerates fine.  ? Lyrica [Pregabalin] Other (See Comments)  ?  Causing severe myoclonic jerking of UE's in march 2022, dc'd  ? Daypro [Oxaprozin] Hives and Dermatitis  ? Pregabalin Er   ?  Other reaction(s): Jerks  ? ? ?Outpatient Encounter Medications as of 06/12/2021  ?Medication Sig  ? acetaminophen (TYLENOL) 650 MG CR tablet Take 650-1,300 mg by mouth every 8 (eight) hours as needed for pain.   ? albuterol (VENTOLIN HFA) 108 (90 Base) MCG/ACT inhaler INHALE 2 PUFFS INTO THE LUNGS EVERY 4 (FOUR) HOURS AS NEEDED FOR WHEEZING OR SHORTNESS OF BREATH.  ? amLODipine (NORVASC) 10 MG tablet TAKE 1 TABLET BY MOUTH EVERY DAY  ? atorvastatin (LIPITOR) 40 MG tablet Take 1 tablet (40 mg total) by mouth daily. Please make yearly appt with Dr. Acie Fredrickson for April 2022 for future refills. Thank you 1st attempt  ? blood glucose meter kit and supplies KIT Dispense based on patient and insurance preference. Patient should test sugars twice daily as directed. Dx. E11.9  ? DULoxetine (CYMBALTA) 20 MG capsule TAKE 1 CAPSULE BY MOUTH EVERY DAY  ? hydrALAZINE (APRESOLINE) 100 MG tablet TAKE 1 TABLET BY MOUTH TWICE A DAY  ? HYDROcodone-acetaminophen (NORCO/VICODIN) 5-325 MG tablet Take 1 tablet by  mouth every 6 (six) hours as needed for moderate pain.  ? isosorbide dinitrate (ISORDIL) 20 MG tablet TAKE 1 TABLET BY MOUTH THREE TIMES A DAY  ? losartan (COZAAR) 50 MG tablet TAKE 1 TABLET BY MOUTH EVERY DAY  ? multivitamin (RENA-VIT) TABS tablet Take 1 tablet by  mouth every morning.   ? omeprazole (PRILOSEC) 40 MG capsule TAKE 1 CAPSULE BY MOUTH EVERY DAY  ? ONETOUCH ULTRA test strip USE AS INSTRUCTED TO TEST SUGARS TWICE DAILY. DX. E11.9  ? QUEtiapine (SEROQUEL) 100 MG tablet TAKE 1 TABLET BY MOUTH EVERYDAY AT BEDTIME  ? traZODone (DESYREL) 50 MG tablet Take 50 mg by mouth at bedtime as needed for sleep.   ? ?No facility-administered encounter medications on file as of 06/12/2021.  ? ? ?Patient Active Problem List  ? Diagnosis Date Noted  ? Allergy, unspecified, initial encounter 01/23/2021  ? Anaphylactic shock, unspecified, initial encounter 01/23/2021  ? Gout 07/17/2020  ? Chronic pain disorder 06/11/2020  ? Depression with anxiety 06/11/2020  ? Complication of vascular dialysis catheter 04/13/2020  ? Shortness of breath 01/05/2020  ? Abnormal findings on diagnostic imaging of lung 01/05/2020  ? Chronic respiratory failure with hypoxia ([place]) 01/05/2020  ? Peripheral neuropathy 09/22/2019  ? Tumor 07/28/2019  ? History of seizures 07/28/2019  ? Stress incontinence 12/24/2017  ? Cough variant asthma 12/24/2017  ? Tremor of left hand 12/24/2017  ? History of internal jugular thrombosis 03/26/2017  ? 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 Anemia 07/08/2016  ? Abnormal brain MRI 07/03/2016  ? Fasciculation 06/18/2016  ? Carotid artery disease ([place])   ? Hyperlipidemia associated with type 2 diabetes mellitus ([place]) 06/28/2015  ? Diabetes mellitus type II, controlled ([place]) 06/28/2015  ? Thoracic ascending aortic aneurysm (41 mm on Echo 06/2013) 07/06/2013  ? ESRD on dialysis ([place]) 06/16/2013  ? History of renal transplant 06/16/2013  ? GERD (gastroesophageal reflux disease) 08/13/2009  ? OTH&UNSPEC NONINFECTIOUS GASTROENTERITIS&COLITIS 08/13/2009  ? Essential hypertension 04/25/2009  ? Hemorrhoids 04/25/2009  ? WEIGHT LOSS 04/25/2009  ? NAUSEA AND VOMITING 04/25/2009  ? PERSONAL HX COLONIC POLYPS 04/25/2009  ? ? ?Conditions to be  addressed/monitored:HTN, HLD, DMII, and ESRD ? ?Care Plan : RN Care Manager Plan of Care  ?Updates made by Dimitri Ped, RN since 06/12/2021 12:00 AM  ?  ? ?Problem: Chronic Disease Management and Care Coordination Needs (HTN,DM2, ESRD, HLD   ?Priority: High  ?  ? ?Long-Range Goal: Establish Plan of Care for Chronic Disease Management Needs (HTN,DM2, ESRD, HLD   ?Start Date: 06/12/2021  ?Expected End Date: 06/12/2022  ?Priority: High  ?Note:   ?Current Barriers:  ?Knowledge Deficits related to plan of care for management of HTN, HLD, DMII, and ESRD  ?Care Coordination needs related to Housing barriers ?Chronic Disease Management support and education needs related to HTN, HLD, DMII, and ESRD  ?Spoke with daughter [name] Designated [employer] Release.  States pt has been doing well.  States she drives herself to dialysis.  States that dialysis checks her CBG and B/P so pt does not check at home.  States her son lives with her and he does the cooking and tries to cook healthy.  States that their rent has become expensive. ? ?RNCM Clinical Goal(s):  ?Patient will verbalize understanding of plan for management of HTN, HLD, DMII, and ESRD as evidenced by voiced adherence to plan of care ?verbalize basic understanding of  HTN, HLD, DMII, and ESRD disease process and self health management plan as  evidenced by voiced understanding and teach back  ?take all medications exactly as prescribed and will call provider for medication related questions as evidenced by dispense report and pt verbalization  ?attend all scheduled medical appointments: Dr. Birdie Riddle as evidenced by medical records ?demonstrate Improved adherence to prescribed treatment plan for HTN, HLD, DMII, and ESRD as evidenced by readings within limits, voiced adherence to plan of care ?continue to work with RN Care Manager to address care management and care coordination needs related to  HTN, HLD, DMII, and ESRD as evidenced by adherence to CM Team  Scheduled appointments ?work with community resource care guide to address needs related to  Housing barriers as evidenced by patient and/or community resource care guide support through collaboration with Consulting civil engineer, prov

## 2021-06-12 NOTE — Patient Instructions (Signed)
Visit Information  ? ?Thank you for taking time to visit with me today. Please don't hesitate to contact me if I can be of assistance to you before our next scheduled telephone appointment. ? ?Following are the goals we discussed today:  ?Take all medications as prescribed ?Attend all scheduled provider appointments ?Call provider office for new concerns or questions  ?schedule appointment with eye doctor ?check blood sugar at prescribed times: once daily and when you have symptoms of low or high blood sugar ?check feet daily for cuts, sores or redness ?fill half of plate with vegetables ?limit fast food meals to no more than 1 per week ?manage portion size ?switch to sugar-free drinks ?check blood pressure daily ?write blood pressure results in a log or diary ?take blood pressure log to all doctor appointments ?call doctor for signs and symptoms of high blood pressure ?eat more whole grains, fruits and vegetables, lean meats and healthy fats ?call for medicine refill 2 or 3 days before it runs out ?take all medications exactly as prescribed ?call doctor with any symptoms you believe are related to your medicine ?Dyslipidemia ?Dyslipidemia is an imbalance of waxy, fat-like substances (lipids) in the blood. The body needs lipids in small amounts. Dyslipidemia often involves a high level of cholesterol or triglycerides, which are types of lipids. ?Common forms of dyslipidemia include: ?High levels of LDL cholesterol. LDL is the type of cholesterol that causes fatty deposits (plaques) to build up in the blood vessels that carry blood away from the heart (arteries). ?Low levels of HDL cholesterol. HDL cholesterol is the type of cholesterol that protects against heart disease. High levels of HDL remove the LDL buildup from arteries. ?High levels of triglycerides. Triglycerides are a fatty substance in the blood that is linked to a buildup of plaques in the arteries. ?What are the causes? ?There are two main types of  dyslipidemia: primary and secondary. Primary dyslipidemia is caused by changes (mutations) in genes that are passed down through families (inherited). These mutations cause several types of dyslipidemia. ?Secondary dyslipidemia may be caused by various risk factors that can lead to the disease, such as lifestyle choices and certain medical conditions. ?What increases the risk? ?You are more likely to develop this condition if you are an older man or if you are a woman who has gone through menopause. Other risk factors include: ?Having a family history of dyslipidemia. ?Taking certain medicines, including birth control pills, steroids, some diuretics, and beta-blockers. ?Eating a diet high in saturated fat. ?Smoking cigarettes or excessive alcohol intake. ?Having certain medical conditions such as diabetes, polycystic ovary syndrome (PCOS), kidney disease, liver disease, or hypothyroidism. ?Not exercising regularly. ?Being overweight or obese with too much belly fat. ?What are the signs or symptoms? ?In most cases, dyslipidemia does not usually cause any symptoms. ?In severe cases, very high lipid levels can cause: ?Fatty bumps under the skin (xanthomas). ?A white or gray ring around the black center (pupil) of the eye. ?Very high triglyceride levels can cause inflammation of the pancreas (pancreatitis). ?How is this diagnosed? ?Your health care provider may diagnose dyslipidemia based on a routine blood test (fasting blood test). Because most people do not have symptoms of the condition, this blood testing (lipid profile) is done on adults age 75 and older and is repeated every 4-6 years. This test checks: ?Total cholesterol. This measures the total amount of cholesterol in your blood, including LDL cholesterol, HDL cholesterol, and triglycerides. A healthy number is below 200 mg/dL (5.17  mmol/L). ?LDL cholesterol. The target number for LDL cholesterol is different for each person, depending on individual risk  factors. A healthy number is usually below 100 mg/dL (2.59 mmol/L). Ask your health care provider what your LDL cholesterol should be. ?HDL cholesterol. An HDL level of 60 mg/dL (1.55 mmol/L) or higher is best because it helps to protect against heart disease. A number below 40 mg/dL (1.03 mmol/L) for men or below 50 mg/dL (1.29 mmol/L) for women increases the risk for heart disease. ?Triglycerides. A healthy triglyceride number is below 150 mg/dL (1.69 mmol/L). ?If your lipid profile is abnormal, your health care provider may do other blood tests. ?How is this treated? ?Treatment depends on the type of dyslipidemia that you have and your other risk factors for heart disease and stroke. Your health care provider will have a target range for your lipid levels based on this information. ?Treatment for dyslipidemia starts with lifestyle changes, such as diet and exercise. Your health care provider may recommend that you: ?Get regular exercise. ?Make changes to your diet. ?Quit smoking if you smoke. ?Limit your alcohol intake. ?If diet changes and exercise do not help you reach your goals, your health care provider may also prescribe medicine to lower lipids. The most commonly prescribed type of medicine lowers your LDL cholesterol (statin drug). If you have a high triglyceride level, your provider may prescribe another type of drug (fibrate) or an omega-3 fish oil supplement, or both. ?Follow these instructions at home: ?Eating and drinking ? ?Follow instructions from your health care provider or dietitian about eating or drinking restrictions. ?Eat a healthy diet as told by your health care provider. This can help you reach and maintain a healthy weight, lower your LDL cholesterol, and raise your HDL cholesterol. This may include: ?Limiting your calories, if you are overweight. ?Eating more fruits, vegetables, whole grains, fish, and lean meats. ?Limiting saturated fat, trans fat, and cholesterol. ?Do not drink  alcohol if: ?Your health care provider tells you not to drink. ?You are pregnant, may be pregnant, or are planning to become pregnant. ?If you drink alcohol: ?Limit how much you have to: ?0-1 drink a day for women. ?0-2 drinks a day for men. ?Know how much alcohol is in your drink. In the [place] one drink equals one 12 oz bottle of beer (355 mL), one 5 oz glass of wine (148 mL), or one 1? oz glass of hard liquor (44 mL). ?Activity ?Get regular exercise. Start an exercise and strength training program as told by your health care provider. Ask your health care provider what activities are safe for you. Your health care provider may recommend: ?30 minutes of aerobic activity 4-6 days a week. Brisk walking is an example of aerobic activity. ?Strength training 2 days a week. ?General instructions ?Do not use any products that contain nicotine or tobacco. These products include cigarettes, chewing tobacco, and vaping devices, such as e-cigarettes. If you need help quitting, ask your health care provider. ?Take over-the-counter and prescription medicines only as told by your health care provider. This includes supplements. ?Keep all follow-up visits. This is important. ?Contact a health care provider if: ?You are having trouble sticking to your exercise or diet plan. ?You are struggling to quit smoking or to control your use of alcohol. ?Summary ?Dyslipidemia often involves a high level of cholesterol or triglycerides, which are types of lipids. ?Treatment depends on the type of dyslipidemia that you have and your other risk factors for heart disease and stroke. ?  Treatment for dyslipidemia starts with lifestyle changes, such as diet and exercise. ?Your health care provider may prescribe medicine to lower lipids. ?This information is not intended to replace advice given to you by your health care provider. Make sure you discuss any questions you have with your health care provider. ?Document Revised: 05/14/2020 Document  Reviewed: 05/14/2020 ?Elsevier Patient Education ? [place]. ? ?Our next appointment is by telephone on 08/21/21 at 3 PM ? ?Please call the care guide team at [phone] if you need to cancel or reschedule you

## 2021-06-13 ENCOUNTER — Telehealth: Payer: Self-pay

## 2021-06-13 ENCOUNTER — Other Ambulatory Visit: Payer: Self-pay | Admitting: Family Medicine

## 2021-06-13 DIAGNOSIS — I10 Essential (primary) hypertension: Secondary | ICD-10-CM

## 2021-06-13 NOTE — Telephone Encounter (Signed)
? ?  Telephone encounter was:  Successful.  ?06/13/2021 ?Name: [name] MRN: [id number] DOB: 1955-09-13 ? ?[name] [name] is a 66 y.o. year old female who is a primary care patient of Tabori, [name], MD . The community resource team was consulted for assistance with Food Insecurity and Financial Difficulties related to [place] ? ?Care guide performed the following interventions: Patient provided with information about care guide support team and interviewed to confirm resource needs.Patient stated she needs help with paying for rent and possibly utilities, her food stamps ae being reduced to $20 a month ? ?Follow Up Plan:  Care guide will follow up with patient by phone over the next two weeks ? ? ? ?Larena Sox ?Care Guide, Embedded Care Coordination ?[facility], Care Management  ?[phone] ?[address], [place], [place] [address] ?Phone: [phone] ?Email: [email or web address] ? ?  ?

## 2021-06-14 DIAGNOSIS — N186 End stage renal disease: Secondary | ICD-10-CM | POA: Diagnosis not present

## 2021-06-14 DIAGNOSIS — Z992 Dependence on renal dialysis: Secondary | ICD-10-CM | POA: Diagnosis not present

## 2021-06-14 DIAGNOSIS — N2581 Secondary hyperparathyroidism of renal origin: Secondary | ICD-10-CM | POA: Diagnosis not present

## 2021-06-14 DIAGNOSIS — D689 Coagulation defect, unspecified: Secondary | ICD-10-CM | POA: Diagnosis not present

## 2021-06-14 DIAGNOSIS — L299 Pruritus, unspecified: Secondary | ICD-10-CM | POA: Diagnosis not present

## 2021-06-17 DIAGNOSIS — Z992 Dependence on renal dialysis: Secondary | ICD-10-CM | POA: Diagnosis not present

## 2021-06-17 DIAGNOSIS — N2581 Secondary hyperparathyroidism of renal origin: Secondary | ICD-10-CM | POA: Diagnosis not present

## 2021-06-17 DIAGNOSIS — L299 Pruritus, unspecified: Secondary | ICD-10-CM | POA: Diagnosis not present

## 2021-06-17 DIAGNOSIS — N186 End stage renal disease: Secondary | ICD-10-CM | POA: Diagnosis not present

## 2021-06-17 DIAGNOSIS — D689 Coagulation defect, unspecified: Secondary | ICD-10-CM | POA: Diagnosis not present

## 2021-06-19 DIAGNOSIS — L299 Pruritus, unspecified: Secondary | ICD-10-CM | POA: Diagnosis not present

## 2021-06-19 DIAGNOSIS — N2581 Secondary hyperparathyroidism of renal origin: Secondary | ICD-10-CM | POA: Diagnosis not present

## 2021-06-19 DIAGNOSIS — D689 Coagulation defect, unspecified: Secondary | ICD-10-CM | POA: Diagnosis not present

## 2021-06-19 DIAGNOSIS — Z992 Dependence on renal dialysis: Secondary | ICD-10-CM | POA: Diagnosis not present

## 2021-06-19 DIAGNOSIS — N186 End stage renal disease: Secondary | ICD-10-CM | POA: Diagnosis not present

## 2021-06-21 DIAGNOSIS — L299 Pruritus, unspecified: Secondary | ICD-10-CM | POA: Diagnosis not present

## 2021-06-21 DIAGNOSIS — I120 Hypertensive chronic kidney disease with stage 5 chronic kidney disease or end stage renal disease: Secondary | ICD-10-CM | POA: Diagnosis not present

## 2021-06-21 DIAGNOSIS — E785 Hyperlipidemia, unspecified: Secondary | ICD-10-CM | POA: Diagnosis not present

## 2021-06-21 DIAGNOSIS — E1122 Type 2 diabetes mellitus with diabetic chronic kidney disease: Secondary | ICD-10-CM | POA: Diagnosis not present

## 2021-06-21 DIAGNOSIS — I150 Renovascular hypertension: Secondary | ICD-10-CM | POA: Diagnosis not present

## 2021-06-21 DIAGNOSIS — N2581 Secondary hyperparathyroidism of renal origin: Secondary | ICD-10-CM | POA: Diagnosis not present

## 2021-06-21 DIAGNOSIS — Z992 Dependence on renal dialysis: Secondary | ICD-10-CM | POA: Diagnosis not present

## 2021-06-21 DIAGNOSIS — N186 End stage renal disease: Secondary | ICD-10-CM | POA: Diagnosis not present

## 2021-06-21 DIAGNOSIS — D689 Coagulation defect, unspecified: Secondary | ICD-10-CM | POA: Diagnosis not present

## 2021-06-22 DIAGNOSIS — R0602 Shortness of breath: Secondary | ICD-10-CM | POA: Diagnosis not present

## 2021-06-24 DIAGNOSIS — Z992 Dependence on renal dialysis: Secondary | ICD-10-CM | POA: Diagnosis not present

## 2021-06-24 DIAGNOSIS — L299 Pruritus, unspecified: Secondary | ICD-10-CM | POA: Diagnosis not present

## 2021-06-24 DIAGNOSIS — N186 End stage renal disease: Secondary | ICD-10-CM | POA: Diagnosis not present

## 2021-06-24 DIAGNOSIS — N2581 Secondary hyperparathyroidism of renal origin: Secondary | ICD-10-CM | POA: Diagnosis not present

## 2021-06-24 DIAGNOSIS — D689 Coagulation defect, unspecified: Secondary | ICD-10-CM | POA: Diagnosis not present

## 2021-06-26 DIAGNOSIS — Z992 Dependence on renal dialysis: Secondary | ICD-10-CM | POA: Diagnosis not present

## 2021-06-26 DIAGNOSIS — N2581 Secondary hyperparathyroidism of renal origin: Secondary | ICD-10-CM | POA: Diagnosis not present

## 2021-06-26 DIAGNOSIS — L299 Pruritus, unspecified: Secondary | ICD-10-CM | POA: Diagnosis not present

## 2021-06-26 DIAGNOSIS — N186 End stage renal disease: Secondary | ICD-10-CM | POA: Diagnosis not present

## 2021-06-26 DIAGNOSIS — D689 Coagulation defect, unspecified: Secondary | ICD-10-CM | POA: Diagnosis not present

## 2021-06-28 DIAGNOSIS — Z992 Dependence on renal dialysis: Secondary | ICD-10-CM | POA: Diagnosis not present

## 2021-06-28 DIAGNOSIS — D689 Coagulation defect, unspecified: Secondary | ICD-10-CM | POA: Diagnosis not present

## 2021-06-28 DIAGNOSIS — N186 End stage renal disease: Secondary | ICD-10-CM | POA: Diagnosis not present

## 2021-06-28 DIAGNOSIS — N2581 Secondary hyperparathyroidism of renal origin: Secondary | ICD-10-CM | POA: Diagnosis not present

## 2021-06-28 DIAGNOSIS — L299 Pruritus, unspecified: Secondary | ICD-10-CM | POA: Diagnosis not present

## 2021-07-01 DIAGNOSIS — N2581 Secondary hyperparathyroidism of renal origin: Secondary | ICD-10-CM | POA: Diagnosis not present

## 2021-07-01 DIAGNOSIS — D689 Coagulation defect, unspecified: Secondary | ICD-10-CM | POA: Diagnosis not present

## 2021-07-01 DIAGNOSIS — N186 End stage renal disease: Secondary | ICD-10-CM | POA: Diagnosis not present

## 2021-07-01 DIAGNOSIS — Z992 Dependence on renal dialysis: Secondary | ICD-10-CM | POA: Diagnosis not present

## 2021-07-01 DIAGNOSIS — L299 Pruritus, unspecified: Secondary | ICD-10-CM | POA: Diagnosis not present

## 2021-07-03 DIAGNOSIS — L299 Pruritus, unspecified: Secondary | ICD-10-CM | POA: Diagnosis not present

## 2021-07-03 DIAGNOSIS — Z992 Dependence on renal dialysis: Secondary | ICD-10-CM | POA: Diagnosis not present

## 2021-07-03 DIAGNOSIS — N2581 Secondary hyperparathyroidism of renal origin: Secondary | ICD-10-CM | POA: Diagnosis not present

## 2021-07-03 DIAGNOSIS — D689 Coagulation defect, unspecified: Secondary | ICD-10-CM | POA: Diagnosis not present

## 2021-07-03 DIAGNOSIS — N186 End stage renal disease: Secondary | ICD-10-CM | POA: Diagnosis not present

## 2021-07-05 DIAGNOSIS — N186 End stage renal disease: Secondary | ICD-10-CM | POA: Diagnosis not present

## 2021-07-05 DIAGNOSIS — D689 Coagulation defect, unspecified: Secondary | ICD-10-CM | POA: Diagnosis not present

## 2021-07-05 DIAGNOSIS — Z992 Dependence on renal dialysis: Secondary | ICD-10-CM | POA: Diagnosis not present

## 2021-07-05 DIAGNOSIS — L299 Pruritus, unspecified: Secondary | ICD-10-CM | POA: Diagnosis not present

## 2021-07-05 DIAGNOSIS — N2581 Secondary hyperparathyroidism of renal origin: Secondary | ICD-10-CM | POA: Diagnosis not present

## 2021-07-08 DIAGNOSIS — N2581 Secondary hyperparathyroidism of renal origin: Secondary | ICD-10-CM | POA: Diagnosis not present

## 2021-07-08 DIAGNOSIS — Z992 Dependence on renal dialysis: Secondary | ICD-10-CM | POA: Diagnosis not present

## 2021-07-08 DIAGNOSIS — N186 End stage renal disease: Secondary | ICD-10-CM | POA: Diagnosis not present

## 2021-07-08 DIAGNOSIS — E1129 Type 2 diabetes mellitus with other diabetic kidney complication: Secondary | ICD-10-CM | POA: Diagnosis not present

## 2021-07-08 DIAGNOSIS — D689 Coagulation defect, unspecified: Secondary | ICD-10-CM | POA: Diagnosis not present

## 2021-07-08 DIAGNOSIS — L299 Pruritus, unspecified: Secondary | ICD-10-CM | POA: Diagnosis not present

## 2021-07-10 DIAGNOSIS — E1129 Type 2 diabetes mellitus with other diabetic kidney complication: Secondary | ICD-10-CM | POA: Diagnosis not present

## 2021-07-10 DIAGNOSIS — D689 Coagulation defect, unspecified: Secondary | ICD-10-CM | POA: Diagnosis not present

## 2021-07-10 DIAGNOSIS — N186 End stage renal disease: Secondary | ICD-10-CM | POA: Diagnosis not present

## 2021-07-10 DIAGNOSIS — L299 Pruritus, unspecified: Secondary | ICD-10-CM | POA: Diagnosis not present

## 2021-07-10 DIAGNOSIS — Z992 Dependence on renal dialysis: Secondary | ICD-10-CM | POA: Diagnosis not present

## 2021-07-10 DIAGNOSIS — N2581 Secondary hyperparathyroidism of renal origin: Secondary | ICD-10-CM | POA: Diagnosis not present

## 2021-07-12 DIAGNOSIS — Z992 Dependence on renal dialysis: Secondary | ICD-10-CM | POA: Diagnosis not present

## 2021-07-12 DIAGNOSIS — N2581 Secondary hyperparathyroidism of renal origin: Secondary | ICD-10-CM | POA: Diagnosis not present

## 2021-07-12 DIAGNOSIS — D689 Coagulation defect, unspecified: Secondary | ICD-10-CM | POA: Diagnosis not present

## 2021-07-12 DIAGNOSIS — E1129 Type 2 diabetes mellitus with other diabetic kidney complication: Secondary | ICD-10-CM | POA: Diagnosis not present

## 2021-07-12 DIAGNOSIS — L299 Pruritus, unspecified: Secondary | ICD-10-CM | POA: Diagnosis not present

## 2021-07-12 DIAGNOSIS — N186 End stage renal disease: Secondary | ICD-10-CM | POA: Diagnosis not present

## 2021-07-15 ENCOUNTER — Other Ambulatory Visit: Payer: Self-pay

## 2021-07-15 DIAGNOSIS — D689 Coagulation defect, unspecified: Secondary | ICD-10-CM | POA: Diagnosis not present

## 2021-07-15 DIAGNOSIS — Z992 Dependence on renal dialysis: Secondary | ICD-10-CM | POA: Diagnosis not present

## 2021-07-15 DIAGNOSIS — N2581 Secondary hyperparathyroidism of renal origin: Secondary | ICD-10-CM | POA: Diagnosis not present

## 2021-07-15 DIAGNOSIS — L299 Pruritus, unspecified: Secondary | ICD-10-CM | POA: Diagnosis not present

## 2021-07-15 DIAGNOSIS — N186 End stage renal disease: Secondary | ICD-10-CM | POA: Diagnosis not present

## 2021-07-17 DIAGNOSIS — D689 Coagulation defect, unspecified: Secondary | ICD-10-CM | POA: Diagnosis not present

## 2021-07-17 DIAGNOSIS — Z992 Dependence on renal dialysis: Secondary | ICD-10-CM | POA: Diagnosis not present

## 2021-07-17 DIAGNOSIS — N2581 Secondary hyperparathyroidism of renal origin: Secondary | ICD-10-CM | POA: Diagnosis not present

## 2021-07-17 DIAGNOSIS — L299 Pruritus, unspecified: Secondary | ICD-10-CM | POA: Diagnosis not present

## 2021-07-17 DIAGNOSIS — N186 End stage renal disease: Secondary | ICD-10-CM | POA: Diagnosis not present

## 2021-07-19 DIAGNOSIS — Z992 Dependence on renal dialysis: Secondary | ICD-10-CM | POA: Diagnosis not present

## 2021-07-19 DIAGNOSIS — D689 Coagulation defect, unspecified: Secondary | ICD-10-CM | POA: Diagnosis not present

## 2021-07-19 DIAGNOSIS — N186 End stage renal disease: Secondary | ICD-10-CM | POA: Diagnosis not present

## 2021-07-19 DIAGNOSIS — N2581 Secondary hyperparathyroidism of renal origin: Secondary | ICD-10-CM | POA: Diagnosis not present

## 2021-07-19 DIAGNOSIS — L299 Pruritus, unspecified: Secondary | ICD-10-CM | POA: Diagnosis not present

## 2021-07-21 DIAGNOSIS — I150 Renovascular hypertension: Secondary | ICD-10-CM | POA: Diagnosis not present

## 2021-07-21 DIAGNOSIS — N186 End stage renal disease: Secondary | ICD-10-CM | POA: Diagnosis not present

## 2021-07-21 DIAGNOSIS — Z992 Dependence on renal dialysis: Secondary | ICD-10-CM | POA: Diagnosis not present

## 2021-07-22 DIAGNOSIS — R0602 Shortness of breath: Secondary | ICD-10-CM | POA: Diagnosis not present

## 2021-07-22 DIAGNOSIS — N186 End stage renal disease: Secondary | ICD-10-CM | POA: Diagnosis not present

## 2021-07-22 DIAGNOSIS — D689 Coagulation defect, unspecified: Secondary | ICD-10-CM | POA: Diagnosis not present

## 2021-07-22 DIAGNOSIS — Z992 Dependence on renal dialysis: Secondary | ICD-10-CM | POA: Diagnosis not present

## 2021-07-22 DIAGNOSIS — N2581 Secondary hyperparathyroidism of renal origin: Secondary | ICD-10-CM | POA: Diagnosis not present

## 2021-07-22 DIAGNOSIS — L299 Pruritus, unspecified: Secondary | ICD-10-CM | POA: Diagnosis not present

## 2021-07-23 ENCOUNTER — Ambulatory Visit: Payer: Medicare HMO | Admitting: Cardiovascular Disease

## 2021-07-24 DIAGNOSIS — D689 Coagulation defect, unspecified: Secondary | ICD-10-CM | POA: Diagnosis not present

## 2021-07-24 DIAGNOSIS — N186 End stage renal disease: Secondary | ICD-10-CM | POA: Diagnosis not present

## 2021-07-24 DIAGNOSIS — N2581 Secondary hyperparathyroidism of renal origin: Secondary | ICD-10-CM | POA: Diagnosis not present

## 2021-07-24 DIAGNOSIS — L299 Pruritus, unspecified: Secondary | ICD-10-CM | POA: Diagnosis not present

## 2021-07-24 DIAGNOSIS — Z992 Dependence on renal dialysis: Secondary | ICD-10-CM | POA: Diagnosis not present

## 2021-07-25 ENCOUNTER — Ambulatory Visit (INDEPENDENT_AMBULATORY_CARE_PROVIDER_SITE_OTHER): Payer: Medicare HMO | Admitting: Physician Assistant

## 2021-07-25 ENCOUNTER — Encounter (HOSPITAL_COMMUNITY): Payer: Medicare HMO

## 2021-07-25 VITALS — BP 137/76 | HR 93 | Temp 97.7°F | Resp 18 | Ht 69.0 in | Wt 191.6 lb

## 2021-07-25 DIAGNOSIS — N186 End stage renal disease: Secondary | ICD-10-CM

## 2021-07-25 DIAGNOSIS — Z992 Dependence on renal dialysis: Secondary | ICD-10-CM | POA: Diagnosis not present

## 2021-07-26 ENCOUNTER — Encounter: Payer: Self-pay | Admitting: Physician Assistant

## 2021-07-26 DIAGNOSIS — N2581 Secondary hyperparathyroidism of renal origin: Secondary | ICD-10-CM | POA: Diagnosis not present

## 2021-07-26 DIAGNOSIS — N186 End stage renal disease: Secondary | ICD-10-CM | POA: Diagnosis not present

## 2021-07-26 DIAGNOSIS — D689 Coagulation defect, unspecified: Secondary | ICD-10-CM | POA: Diagnosis not present

## 2021-07-26 DIAGNOSIS — Z992 Dependence on renal dialysis: Secondary | ICD-10-CM | POA: Diagnosis not present

## 2021-07-26 DIAGNOSIS — L299 Pruritus, unspecified: Secondary | ICD-10-CM | POA: Diagnosis not present

## 2021-07-26 NOTE — Progress Notes (Signed)
?Office Note  ? ? ? ?CC:  follow up ?Requesting Provider:  Midge Minium, MD ? ?HPI: [name] [name] is a 66 y.o. (May 25, 1955) female who presents for evaluation of left brachiobasilic fistula.  She underwent second stage basilic vein transposition by Dr. Donzetta Matters in June 2019.  She required plication surgery in September 2020 and again in January 2022.  She is having numbness and tingling in her left fingers.  She states her fistula is functioning properly during HD.  She is dialyzing via left arm fistula on a Monday Wednesday Friday schedule at the [facility] location in [place]. ? ? ?Past Medical History:  ?Diagnosis Date  ? Adenomatous colon polyp 04/1998  ? Anemia   ? Anxiety   ? Arthritis   ? HIp  ? Carotid artery disease ([place])   ? Carotid US 1/18: bilateral ICA 1-39, R thyroid lobe nodule (1.9x2.2x3cm); numerous L thyroid lobe nodules - repeat 1 year  ? Depression   ? Diabetes mellitus   ? diet controlled  ? EBV infection   ? Per [facility]  ? Encephalitis   ? NMDA per [facility]  ? End-stage renal disease (ESRD) ([place])   ? hx of renal transplant failed. [facility].   ? Esophagitis   ? Grade 1 Distal  ? GERD (gastroesophageal reflux disease)   ? Hemorrhoids   ? History of blood transfusion   ? History of kidney stones   ? Passed  ? History of subdural hematoma   ? Hyperkalemia   ? Hyperlipidemia   ? Hypertension   ? Hypomagnesemia   ? Left jugular vein thrombosis   ? Partial resolved per [facility]  ? Metabolic acidosis   ? On home oxygen therapy   ? 2.5 liters  ? Pneumonia   ? Right jugular vein thrombosis   ? resolved from [facility]  ? Seizures ([place])   ? ? ?Past Surgical History:  ?Procedure Laterality Date  ? A/V FISTULAGRAM Left 06/04/2017  ? Procedure: A/V FISTULAGRAM;  Surgeon: Conrad Barlow, MD;  Location: [facility];  Service: Cardiovascular;  Laterality: Left;  ? A/V FISTULAGRAM N/A 03/24/2019  ? Procedure: A/V FISTULAGRAM - left arm;  Surgeon: [name], MD;  Location: [facility];   Service: Cardiovascular;  Laterality: N/A;  ? A/V FISTULAGRAM N/A 03/08/2020  ? Procedure: A/V FISTULAGRAM - Left arm;  Surgeon: [name], MD;  Location: [facility];  Service: Cardiovascular;  Laterality: N/A;  ? ABDOMINAL HYSTERECTOMY    ? AV FISTULA PLACEMENT  07/04/2005  ? [place] AV fistula  ? AV FISTULA PLACEMENT  08/27/2005  ? AV FISTULA PLACEMENT Left 04/29/2017  ? Procedure: Creation of Left Arm ARTERIOVENOUS BRACHIOCEPHALIC FISTULA;  Surgeon: Elam Dutch, MD;  Location: [facility] OR;  Service: Vascular;  Laterality: Left;  ? AV FISTULA PLACEMENT Left 06/16/2017  ? Procedure: BRACHIO_BASCILIC ARTERIOVENOUS (AV) FISTULA CREATION;  Surgeon: Waynetta Sandy, MD;  Location: [facility];  Service: Vascular;  Laterality: Left;  ? AV FISTULA PLACEMENT W/ PTFE  08/27/2005  ? BASCILIC VEIN TRANSPOSITION Left 06/16/2017  ? Procedure: BRACHIOCEPHALIC TRANSPOSITION  LEFT ARM;  Surgeon: Waynetta Sandy, MD;  Location: [facility];  Service: Vascular;  Laterality: Left;  ? BASCILIC VEIN TRANSPOSITION Left 09/01/2017  ? Procedure: BASILIC VEIN TRANSPOSITION SECOND STAGE;  Surgeon: Waynetta Sandy, MD;  Location: [facility];  Service: Vascular;  Laterality: Left;  ? BRAIN BIOPSY    ? CESAREAN SECTION    ?  COLONOSCOPY W/ POLYPECTOMY    ? DG AV DIALYSIS GRAFT DECLOT OR  07/24/2005  ? AV [place] graf  ? DG AV DIALYSIS GRAFT DECLOT OR  Thrombosis right forearm, loop arteriovenous  ? Thrombosis right forearm, loop arteriovenous graft  ? FISTULA SUPERFICIALIZATION Left 04/04/2020  ? Procedure: PLICATIONS OF ANEURYSM;  Surgeon: [name], MD;  Location: [facility];  Service: Vascular;  Laterality: Left;  ? GASTROSTOMY TUBE PLACEMENT    ? INSERTION OF DIALYSIS CATHETER N/A 04/04/2020  ? Procedure: TUNNELED DIALYSIS CATHETER PLACEMENT;  Surgeon: [name], MD;  Location: [facility];  Service: Vascular;  Laterality: N/A;  ? KIDNEY TRANSPLANT  2009  ? Both  ? PERIPHERAL VASCULAR BALLOON ANGIOPLASTY  Left 03/24/2019  ? Procedure: PERIPHERAL VASCULAR BALLOON ANGIOPLASTY;  Surgeon: [name], MD;  Location: [facility];  Service: Cardiovascular;  Laterality: Left;  left AV fistula  ? PERIPHERAL VASCULAR BALLOON ANGIOPLASTY  03/08/2020  ? Procedure: PERIPHERAL VASCULAR BALLOON ANGIOPLASTY;  Surgeon: [name], MD;  Location: [facility];  Service: Cardiovascular;;  ? REMOVAL OF GASTROSTOMY TUBE    ? REVISON OF ARTERIOVENOUS FISTULA Left 11/23/2018  ? Procedure: REVISION PLICATION OF ARTERIOVENOUS FISTULA;  Surgeon: Waynetta Sandy, MD;  Location: [facility];  Service: Vascular;  Laterality: Left;  ? REVISON OF ARTERIOVENOUS FISTULA Left 04/04/2020  ? Procedure: LEFT ARM ARTERIOVENOUS FISTULA REVISON;  Surgeon: [name], MD;  Location: [facility];  Service: Vascular;  Laterality: Left;  ? THROMBECTOMY / ARTERIOVENOUS GRAFT REVISION  10/12/2006  ? THROMBECTOMY / ARTERIOVENOUS GRAFT REVISION  10/16/2006  ? ? ?Social History  ? ?Socioeconomic History  ? Marital status: Widowed  ?  Spouse name: Not on file  ? Number of children: 2  ? Years of education: 76  ? Highest education level: Not on file  ?Occupational History  ? Occupation: Disabled  ?Tobacco Use  ? Smoking status: Former  ? Smokeless tobacco: Never  ? Tobacco comments:  ?  smoked in teens  ?Vaping Use  ? Vaping Use: Never used  ?Substance and Sexual Activity  ? Alcohol use: Not Currently  ? Drug use: No  ? Sexual activity: Never  ?Other Topics Concern  ? Not on file  ?Social History Narrative  ? Denies caffeine use   ? ?Social Determinants of Health  ? ?Financial Resource Strain: High Risk  ? Difficulty of Paying Living Expenses: Very hard  ?Food Insecurity: Food Insecurity Present  ? Worried About Charity fundraiser in the Last Year: Often true  ? Ran Out of Food in the Last Year: Often true  ?Transportation Needs: No Transportation Needs  ? Lack of Transportation (Medical): No  ? Lack of Transportation  (Non-Medical): No  ?Physical Activity: Not on file  ?Stress: No Stress Concern Present  ? Feeling of Stress : Not at all  ?Social Connections: Not on file  ?Intimate Partner Violence: Not on file  ? ? ?Family History  ?Problem Relation Age of Onset  ? Kidney disease Paternal Aunt   ? Heart disease Mother   ? Heart disease Father   ? Colon cancer Neg Hx   ? ? ?Current Outpatient Medications  ?Medication Sig Dispense Refill  ? acetaminophen (TYLENOL) 650 MG CR tablet Take 650-1,300 mg by mouth every 8 (eight) hours as needed for pain.     ? albuterol (VENTOLIN HFA) 108 (90 Base) MCG/ACT inhaler INHALE 2 PUFFS INTO THE LUNGS EVERY 4 (FOUR) HOURS AS NEEDED FOR  WHEEZING OR SHORTNESS OF BREATH. 18 g 3  ? amLODipine (NORVASC) 10 MG tablet TAKE 1 TABLET BY MOUTH EVERY DAY 90 tablet 0  ? atorvastatin (LIPITOR) 40 MG tablet Take 1 tablet (40 mg total) by mouth daily. Please make yearly appt with Dr. Acie Fredrickson for April 2022 for future refills. Thank you 1st attempt 90 tablet 0  ? blood glucose meter kit and supplies KIT Dispense based on patient and insurance preference. Patient should test sugars twice daily as directed. Dx. E11.9 1 each 0  ? DULoxetine (CYMBALTA) 20 MG capsule TAKE 1 CAPSULE BY MOUTH EVERY DAY 90 capsule 0  ? hydrALAZINE (APRESOLINE) 100 MG tablet TAKE 1 TABLET BY MOUTH TWICE A DAY (Patient taking differently: 2 (two) times daily.) 180 tablet 0  ? HYDROcodone-acetaminophen (NORCO/VICODIN) 5-325 MG tablet Take 1 tablet by mouth every 6 (six) hours as needed for moderate pain. 30 tablet 0  ? isosorbide dinitrate (ISORDIL) 20 MG tablet TAKE 1 TABLET BY MOUTH THREE TIMES A DAY 270 tablet 0  ? losartan (COZAAR) 50 MG tablet TAKE 1 TABLET BY MOUTH EVERY DAY 90 tablet 1  ? multivitamin (RENA-VIT) TABS tablet Take 1 tablet by mouth every morning.   11  ? omeprazole (PRILOSEC) 40 MG capsule TAKE 1 CAPSULE BY MOUTH EVERY DAY 90 capsule 1  ? ONETOUCH ULTRA test strip USE AS INSTRUCTED TO TEST SUGARS TWICE DAILY. DX.  E11.9 100 strip 12  ? QUEtiapine (SEROQUEL) 100 MG tablet TAKE 1 TABLET BY MOUTH EVERYDAY AT BEDTIME 90 tablet 1  ? traZODone (DESYREL) 50 MG tablet Take 50 mg by mouth at bedtime as needed for sleep.     ? ?No cu

## 2021-07-29 DIAGNOSIS — N186 End stage renal disease: Secondary | ICD-10-CM | POA: Diagnosis not present

## 2021-07-29 DIAGNOSIS — N2581 Secondary hyperparathyroidism of renal origin: Secondary | ICD-10-CM | POA: Diagnosis not present

## 2021-07-29 DIAGNOSIS — Z992 Dependence on renal dialysis: Secondary | ICD-10-CM | POA: Diagnosis not present

## 2021-07-29 DIAGNOSIS — D689 Coagulation defect, unspecified: Secondary | ICD-10-CM | POA: Diagnosis not present

## 2021-07-31 DIAGNOSIS — N2581 Secondary hyperparathyroidism of renal origin: Secondary | ICD-10-CM | POA: Diagnosis not present

## 2021-07-31 DIAGNOSIS — N186 End stage renal disease: Secondary | ICD-10-CM | POA: Diagnosis not present

## 2021-07-31 DIAGNOSIS — D689 Coagulation defect, unspecified: Secondary | ICD-10-CM | POA: Diagnosis not present

## 2021-07-31 DIAGNOSIS — Z992 Dependence on renal dialysis: Secondary | ICD-10-CM | POA: Diagnosis not present

## 2021-08-02 DIAGNOSIS — N186 End stage renal disease: Secondary | ICD-10-CM | POA: Diagnosis not present

## 2021-08-02 DIAGNOSIS — D689 Coagulation defect, unspecified: Secondary | ICD-10-CM | POA: Diagnosis not present

## 2021-08-02 DIAGNOSIS — Z992 Dependence on renal dialysis: Secondary | ICD-10-CM | POA: Diagnosis not present

## 2021-08-02 DIAGNOSIS — N2581 Secondary hyperparathyroidism of renal origin: Secondary | ICD-10-CM | POA: Diagnosis not present

## 2021-08-05 DIAGNOSIS — E1129 Type 2 diabetes mellitus with other diabetic kidney complication: Secondary | ICD-10-CM | POA: Diagnosis not present

## 2021-08-05 DIAGNOSIS — D689 Coagulation defect, unspecified: Secondary | ICD-10-CM | POA: Diagnosis not present

## 2021-08-05 DIAGNOSIS — N186 End stage renal disease: Secondary | ICD-10-CM | POA: Diagnosis not present

## 2021-08-05 DIAGNOSIS — Z992 Dependence on renal dialysis: Secondary | ICD-10-CM | POA: Diagnosis not present

## 2021-08-05 DIAGNOSIS — N2581 Secondary hyperparathyroidism of renal origin: Secondary | ICD-10-CM | POA: Diagnosis not present

## 2021-08-05 DIAGNOSIS — L299 Pruritus, unspecified: Secondary | ICD-10-CM | POA: Diagnosis not present

## 2021-08-07 DIAGNOSIS — D689 Coagulation defect, unspecified: Secondary | ICD-10-CM | POA: Diagnosis not present

## 2021-08-07 DIAGNOSIS — E1129 Type 2 diabetes mellitus with other diabetic kidney complication: Secondary | ICD-10-CM | POA: Diagnosis not present

## 2021-08-07 DIAGNOSIS — N186 End stage renal disease: Secondary | ICD-10-CM | POA: Diagnosis not present

## 2021-08-07 DIAGNOSIS — L299 Pruritus, unspecified: Secondary | ICD-10-CM | POA: Diagnosis not present

## 2021-08-07 DIAGNOSIS — Z992 Dependence on renal dialysis: Secondary | ICD-10-CM | POA: Diagnosis not present

## 2021-08-07 DIAGNOSIS — N2581 Secondary hyperparathyroidism of renal origin: Secondary | ICD-10-CM | POA: Diagnosis not present

## 2021-08-09 DIAGNOSIS — L299 Pruritus, unspecified: Secondary | ICD-10-CM | POA: Diagnosis not present

## 2021-08-09 DIAGNOSIS — E1129 Type 2 diabetes mellitus with other diabetic kidney complication: Secondary | ICD-10-CM | POA: Diagnosis not present

## 2021-08-09 DIAGNOSIS — D689 Coagulation defect, unspecified: Secondary | ICD-10-CM | POA: Diagnosis not present

## 2021-08-09 DIAGNOSIS — N186 End stage renal disease: Secondary | ICD-10-CM | POA: Diagnosis not present

## 2021-08-09 DIAGNOSIS — N2581 Secondary hyperparathyroidism of renal origin: Secondary | ICD-10-CM | POA: Diagnosis not present

## 2021-08-09 DIAGNOSIS — Z992 Dependence on renal dialysis: Secondary | ICD-10-CM | POA: Diagnosis not present

## 2021-08-10 DIAGNOSIS — N186 End stage renal disease: Secondary | ICD-10-CM | POA: Diagnosis not present

## 2021-08-10 DIAGNOSIS — Z992 Dependence on renal dialysis: Secondary | ICD-10-CM | POA: Diagnosis not present

## 2021-08-10 DIAGNOSIS — L299 Pruritus, unspecified: Secondary | ICD-10-CM | POA: Diagnosis not present

## 2021-08-10 DIAGNOSIS — E1129 Type 2 diabetes mellitus with other diabetic kidney complication: Secondary | ICD-10-CM | POA: Diagnosis not present

## 2021-08-10 DIAGNOSIS — N2581 Secondary hyperparathyroidism of renal origin: Secondary | ICD-10-CM | POA: Diagnosis not present

## 2021-08-10 DIAGNOSIS — D689 Coagulation defect, unspecified: Secondary | ICD-10-CM | POA: Diagnosis not present

## 2021-08-12 DIAGNOSIS — Z992 Dependence on renal dialysis: Secondary | ICD-10-CM | POA: Diagnosis not present

## 2021-08-12 DIAGNOSIS — N2581 Secondary hyperparathyroidism of renal origin: Secondary | ICD-10-CM | POA: Diagnosis not present

## 2021-08-12 DIAGNOSIS — D689 Coagulation defect, unspecified: Secondary | ICD-10-CM | POA: Diagnosis not present

## 2021-08-12 DIAGNOSIS — N186 End stage renal disease: Secondary | ICD-10-CM | POA: Diagnosis not present

## 2021-08-12 DIAGNOSIS — L299 Pruritus, unspecified: Secondary | ICD-10-CM | POA: Diagnosis not present

## 2021-08-12 DIAGNOSIS — D509 Iron deficiency anemia, unspecified: Secondary | ICD-10-CM | POA: Diagnosis not present

## 2021-08-14 DIAGNOSIS — N2581 Secondary hyperparathyroidism of renal origin: Secondary | ICD-10-CM | POA: Diagnosis not present

## 2021-08-14 DIAGNOSIS — L299 Pruritus, unspecified: Secondary | ICD-10-CM | POA: Diagnosis not present

## 2021-08-14 DIAGNOSIS — N186 End stage renal disease: Secondary | ICD-10-CM | POA: Diagnosis not present

## 2021-08-14 DIAGNOSIS — Z992 Dependence on renal dialysis: Secondary | ICD-10-CM | POA: Diagnosis not present

## 2021-08-14 DIAGNOSIS — D689 Coagulation defect, unspecified: Secondary | ICD-10-CM | POA: Diagnosis not present

## 2021-08-14 DIAGNOSIS — D509 Iron deficiency anemia, unspecified: Secondary | ICD-10-CM | POA: Diagnosis not present

## 2021-08-15 ENCOUNTER — Other Ambulatory Visit: Payer: Self-pay | Admitting: Family Medicine

## 2021-08-16 DIAGNOSIS — L299 Pruritus, unspecified: Secondary | ICD-10-CM | POA: Diagnosis not present

## 2021-08-16 DIAGNOSIS — N186 End stage renal disease: Secondary | ICD-10-CM | POA: Diagnosis not present

## 2021-08-16 DIAGNOSIS — D689 Coagulation defect, unspecified: Secondary | ICD-10-CM | POA: Diagnosis not present

## 2021-08-16 DIAGNOSIS — D509 Iron deficiency anemia, unspecified: Secondary | ICD-10-CM | POA: Diagnosis not present

## 2021-08-16 DIAGNOSIS — Z992 Dependence on renal dialysis: Secondary | ICD-10-CM | POA: Diagnosis not present

## 2021-08-16 DIAGNOSIS — N2581 Secondary hyperparathyroidism of renal origin: Secondary | ICD-10-CM | POA: Diagnosis not present

## 2021-08-19 DIAGNOSIS — N2581 Secondary hyperparathyroidism of renal origin: Secondary | ICD-10-CM | POA: Diagnosis not present

## 2021-08-19 DIAGNOSIS — N186 End stage renal disease: Secondary | ICD-10-CM | POA: Diagnosis not present

## 2021-08-19 DIAGNOSIS — Z992 Dependence on renal dialysis: Secondary | ICD-10-CM | POA: Diagnosis not present

## 2021-08-19 DIAGNOSIS — L299 Pruritus, unspecified: Secondary | ICD-10-CM | POA: Diagnosis not present

## 2021-08-19 DIAGNOSIS — D689 Coagulation defect, unspecified: Secondary | ICD-10-CM | POA: Diagnosis not present

## 2021-08-21 ENCOUNTER — Telehealth: Payer: Self-pay

## 2021-08-21 ENCOUNTER — Telehealth: Payer: Medicare HMO

## 2021-08-21 DIAGNOSIS — I150 Renovascular hypertension: Secondary | ICD-10-CM | POA: Diagnosis not present

## 2021-08-21 DIAGNOSIS — N2581 Secondary hyperparathyroidism of renal origin: Secondary | ICD-10-CM | POA: Diagnosis not present

## 2021-08-21 DIAGNOSIS — N186 End stage renal disease: Secondary | ICD-10-CM | POA: Diagnosis not present

## 2021-08-21 DIAGNOSIS — D689 Coagulation defect, unspecified: Secondary | ICD-10-CM | POA: Diagnosis not present

## 2021-08-21 DIAGNOSIS — L299 Pruritus, unspecified: Secondary | ICD-10-CM | POA: Diagnosis not present

## 2021-08-21 DIAGNOSIS — Z992 Dependence on renal dialysis: Secondary | ICD-10-CM | POA: Diagnosis not present

## 2021-08-21 NOTE — Telephone Encounter (Signed)
  Care Management   Follow Up Note   08/21/2021 Name: [name] [name] MRN: [id number] DOB: 07/25/1955   Referred by: Midge Minium, MD Reason for referral : Chronic Care Management (RNCM: Follow up Outreach Chronic Care Management and coordination needs-unsuccessful outreach)   An unsuccessful telephone outreach was attempted today. The patient was referred to the case management team for assistance with care management and care coordination.   Follow Up Plan: The care management team will reach out to the patient again over the next 30 days.   Peter Garter RN, Jackquline Denmark, CDE Care Management Coordinator [facility] [phone]

## 2021-08-22 DIAGNOSIS — R0602 Shortness of breath: Secondary | ICD-10-CM | POA: Diagnosis not present

## 2021-08-23 DIAGNOSIS — L299 Pruritus, unspecified: Secondary | ICD-10-CM | POA: Diagnosis not present

## 2021-08-23 DIAGNOSIS — Z992 Dependence on renal dialysis: Secondary | ICD-10-CM | POA: Diagnosis not present

## 2021-08-23 DIAGNOSIS — N2581 Secondary hyperparathyroidism of renal origin: Secondary | ICD-10-CM | POA: Diagnosis not present

## 2021-08-23 DIAGNOSIS — N186 End stage renal disease: Secondary | ICD-10-CM | POA: Diagnosis not present

## 2021-08-23 DIAGNOSIS — D689 Coagulation defect, unspecified: Secondary | ICD-10-CM | POA: Diagnosis not present

## 2021-08-25 ENCOUNTER — Other Ambulatory Visit: Payer: Self-pay | Admitting: Family

## 2021-08-26 DIAGNOSIS — N186 End stage renal disease: Secondary | ICD-10-CM | POA: Diagnosis not present

## 2021-08-26 DIAGNOSIS — N2581 Secondary hyperparathyroidism of renal origin: Secondary | ICD-10-CM | POA: Diagnosis not present

## 2021-08-26 DIAGNOSIS — D689 Coagulation defect, unspecified: Secondary | ICD-10-CM | POA: Diagnosis not present

## 2021-08-26 DIAGNOSIS — Z992 Dependence on renal dialysis: Secondary | ICD-10-CM | POA: Diagnosis not present

## 2021-08-26 DIAGNOSIS — L299 Pruritus, unspecified: Secondary | ICD-10-CM | POA: Diagnosis not present

## 2021-08-26 NOTE — Telephone Encounter (Signed)
Patient is requesting a refill of the following medications: Requested Prescriptions   Pending Prescriptions Disp Refills   QUEtiapine (SEROQUEL) 100 MG tablet [Pharmacy Med Name: QUETIAPINE FUMARATE 100 MG TAB] 90 tablet 1    Sig: TAKE 1 TABLET BY MOUTH EVERYDAY AT BEDTIME    Date of patient request: 08/25/2021 Last office visit: 05/02/2021 Date of last refill: 11/12/2020 Last refill amount: 90 tablets  Follow up time period per chart: 09/03/2021

## 2021-08-28 DIAGNOSIS — N2581 Secondary hyperparathyroidism of renal origin: Secondary | ICD-10-CM | POA: Diagnosis not present

## 2021-08-28 DIAGNOSIS — D689 Coagulation defect, unspecified: Secondary | ICD-10-CM | POA: Diagnosis not present

## 2021-08-28 DIAGNOSIS — L299 Pruritus, unspecified: Secondary | ICD-10-CM | POA: Diagnosis not present

## 2021-08-28 DIAGNOSIS — N186 End stage renal disease: Secondary | ICD-10-CM | POA: Diagnosis not present

## 2021-08-28 DIAGNOSIS — Z992 Dependence on renal dialysis: Secondary | ICD-10-CM | POA: Diagnosis not present

## 2021-08-30 DIAGNOSIS — N2581 Secondary hyperparathyroidism of renal origin: Secondary | ICD-10-CM | POA: Diagnosis not present

## 2021-08-30 DIAGNOSIS — L299 Pruritus, unspecified: Secondary | ICD-10-CM | POA: Diagnosis not present

## 2021-08-30 DIAGNOSIS — N186 End stage renal disease: Secondary | ICD-10-CM | POA: Diagnosis not present

## 2021-08-30 DIAGNOSIS — D689 Coagulation defect, unspecified: Secondary | ICD-10-CM | POA: Diagnosis not present

## 2021-08-30 DIAGNOSIS — Z992 Dependence on renal dialysis: Secondary | ICD-10-CM | POA: Diagnosis not present

## 2021-09-02 DIAGNOSIS — Z992 Dependence on renal dialysis: Secondary | ICD-10-CM | POA: Diagnosis not present

## 2021-09-02 DIAGNOSIS — D689 Coagulation defect, unspecified: Secondary | ICD-10-CM | POA: Diagnosis not present

## 2021-09-02 DIAGNOSIS — N2581 Secondary hyperparathyroidism of renal origin: Secondary | ICD-10-CM | POA: Diagnosis not present

## 2021-09-02 DIAGNOSIS — N186 End stage renal disease: Secondary | ICD-10-CM | POA: Diagnosis not present

## 2021-09-03 ENCOUNTER — Ambulatory Visit: Payer: Medicare HMO | Admitting: Family Medicine

## 2021-09-03 ENCOUNTER — Ambulatory Visit: Payer: Medicare HMO | Admitting: Cardiovascular Disease

## 2021-09-04 DIAGNOSIS — N2581 Secondary hyperparathyroidism of renal origin: Secondary | ICD-10-CM | POA: Diagnosis not present

## 2021-09-04 DIAGNOSIS — D689 Coagulation defect, unspecified: Secondary | ICD-10-CM | POA: Diagnosis not present

## 2021-09-04 DIAGNOSIS — N186 End stage renal disease: Secondary | ICD-10-CM | POA: Diagnosis not present

## 2021-09-04 DIAGNOSIS — Z992 Dependence on renal dialysis: Secondary | ICD-10-CM | POA: Diagnosis not present

## 2021-09-06 DIAGNOSIS — Z992 Dependence on renal dialysis: Secondary | ICD-10-CM | POA: Diagnosis not present

## 2021-09-06 DIAGNOSIS — N2581 Secondary hyperparathyroidism of renal origin: Secondary | ICD-10-CM | POA: Diagnosis not present

## 2021-09-06 DIAGNOSIS — N186 End stage renal disease: Secondary | ICD-10-CM | POA: Diagnosis not present

## 2021-09-06 DIAGNOSIS — D689 Coagulation defect, unspecified: Secondary | ICD-10-CM | POA: Diagnosis not present

## 2021-09-09 DIAGNOSIS — N2581 Secondary hyperparathyroidism of renal origin: Secondary | ICD-10-CM | POA: Diagnosis not present

## 2021-09-09 DIAGNOSIS — E1129 Type 2 diabetes mellitus with other diabetic kidney complication: Secondary | ICD-10-CM | POA: Diagnosis not present

## 2021-09-09 DIAGNOSIS — L299 Pruritus, unspecified: Secondary | ICD-10-CM | POA: Diagnosis not present

## 2021-09-09 DIAGNOSIS — Z992 Dependence on renal dialysis: Secondary | ICD-10-CM | POA: Diagnosis not present

## 2021-09-09 DIAGNOSIS — N186 End stage renal disease: Secondary | ICD-10-CM | POA: Diagnosis not present

## 2021-09-09 DIAGNOSIS — D689 Coagulation defect, unspecified: Secondary | ICD-10-CM | POA: Diagnosis not present

## 2021-09-11 DIAGNOSIS — N186 End stage renal disease: Secondary | ICD-10-CM | POA: Diagnosis not present

## 2021-09-11 DIAGNOSIS — N2581 Secondary hyperparathyroidism of renal origin: Secondary | ICD-10-CM | POA: Diagnosis not present

## 2021-09-11 DIAGNOSIS — L299 Pruritus, unspecified: Secondary | ICD-10-CM | POA: Diagnosis not present

## 2021-09-11 DIAGNOSIS — D689 Coagulation defect, unspecified: Secondary | ICD-10-CM | POA: Diagnosis not present

## 2021-09-11 DIAGNOSIS — Z992 Dependence on renal dialysis: Secondary | ICD-10-CM | POA: Diagnosis not present

## 2021-09-11 DIAGNOSIS — E1129 Type 2 diabetes mellitus with other diabetic kidney complication: Secondary | ICD-10-CM | POA: Diagnosis not present

## 2021-09-13 DIAGNOSIS — N2581 Secondary hyperparathyroidism of renal origin: Secondary | ICD-10-CM | POA: Diagnosis not present

## 2021-09-13 DIAGNOSIS — D689 Coagulation defect, unspecified: Secondary | ICD-10-CM | POA: Diagnosis not present

## 2021-09-13 DIAGNOSIS — N186 End stage renal disease: Secondary | ICD-10-CM | POA: Diagnosis not present

## 2021-09-13 DIAGNOSIS — L299 Pruritus, unspecified: Secondary | ICD-10-CM | POA: Diagnosis not present

## 2021-09-13 DIAGNOSIS — Z992 Dependence on renal dialysis: Secondary | ICD-10-CM | POA: Diagnosis not present

## 2021-09-13 DIAGNOSIS — E1129 Type 2 diabetes mellitus with other diabetic kidney complication: Secondary | ICD-10-CM | POA: Diagnosis not present

## 2021-09-16 ENCOUNTER — Ambulatory Visit: Payer: Medicare HMO | Admitting: Family Medicine

## 2021-09-16 DIAGNOSIS — N2581 Secondary hyperparathyroidism of renal origin: Secondary | ICD-10-CM | POA: Diagnosis not present

## 2021-09-16 DIAGNOSIS — D689 Coagulation defect, unspecified: Secondary | ICD-10-CM | POA: Diagnosis not present

## 2021-09-16 DIAGNOSIS — Z992 Dependence on renal dialysis: Secondary | ICD-10-CM | POA: Diagnosis not present

## 2021-09-16 DIAGNOSIS — L299 Pruritus, unspecified: Secondary | ICD-10-CM | POA: Diagnosis not present

## 2021-09-16 DIAGNOSIS — N186 End stage renal disease: Secondary | ICD-10-CM | POA: Diagnosis not present

## 2021-09-17 ENCOUNTER — Other Ambulatory Visit: Payer: Self-pay | Admitting: Family Medicine

## 2021-09-18 DIAGNOSIS — D689 Coagulation defect, unspecified: Secondary | ICD-10-CM | POA: Diagnosis not present

## 2021-09-18 DIAGNOSIS — L299 Pruritus, unspecified: Secondary | ICD-10-CM | POA: Diagnosis not present

## 2021-09-18 DIAGNOSIS — Z992 Dependence on renal dialysis: Secondary | ICD-10-CM | POA: Diagnosis not present

## 2021-09-18 DIAGNOSIS — N2581 Secondary hyperparathyroidism of renal origin: Secondary | ICD-10-CM | POA: Diagnosis not present

## 2021-09-18 DIAGNOSIS — N186 End stage renal disease: Secondary | ICD-10-CM | POA: Diagnosis not present

## 2021-09-20 DIAGNOSIS — N2581 Secondary hyperparathyroidism of renal origin: Secondary | ICD-10-CM | POA: Diagnosis not present

## 2021-09-20 DIAGNOSIS — I150 Renovascular hypertension: Secondary | ICD-10-CM | POA: Diagnosis not present

## 2021-09-20 DIAGNOSIS — N186 End stage renal disease: Secondary | ICD-10-CM | POA: Diagnosis not present

## 2021-09-20 DIAGNOSIS — L299 Pruritus, unspecified: Secondary | ICD-10-CM | POA: Diagnosis not present

## 2021-09-20 DIAGNOSIS — D689 Coagulation defect, unspecified: Secondary | ICD-10-CM | POA: Diagnosis not present

## 2021-09-20 DIAGNOSIS — Z992 Dependence on renal dialysis: Secondary | ICD-10-CM | POA: Diagnosis not present

## 2021-09-20 IMAGING — CR DG FOOT COMPLETE 3+V*R*
3 series · 3 of 3 positions shown · non-contrast
Comparison: 02/10/2019

CLINICAL DATA: Shortness of breath right foot pain

EXAM:
RIGHT FOOT COMPLETE - 3+ VIEW

[foot ap]
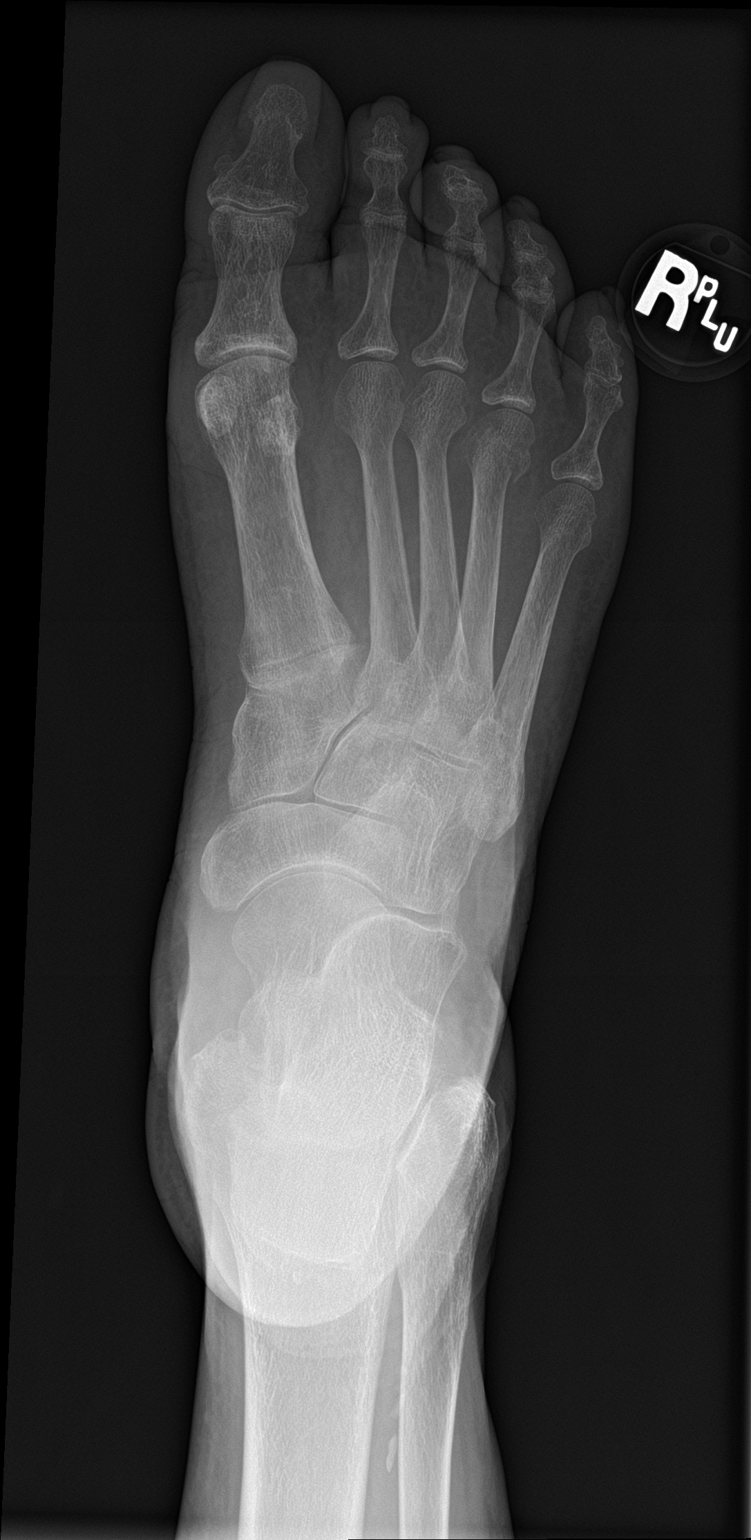

[foot obl]
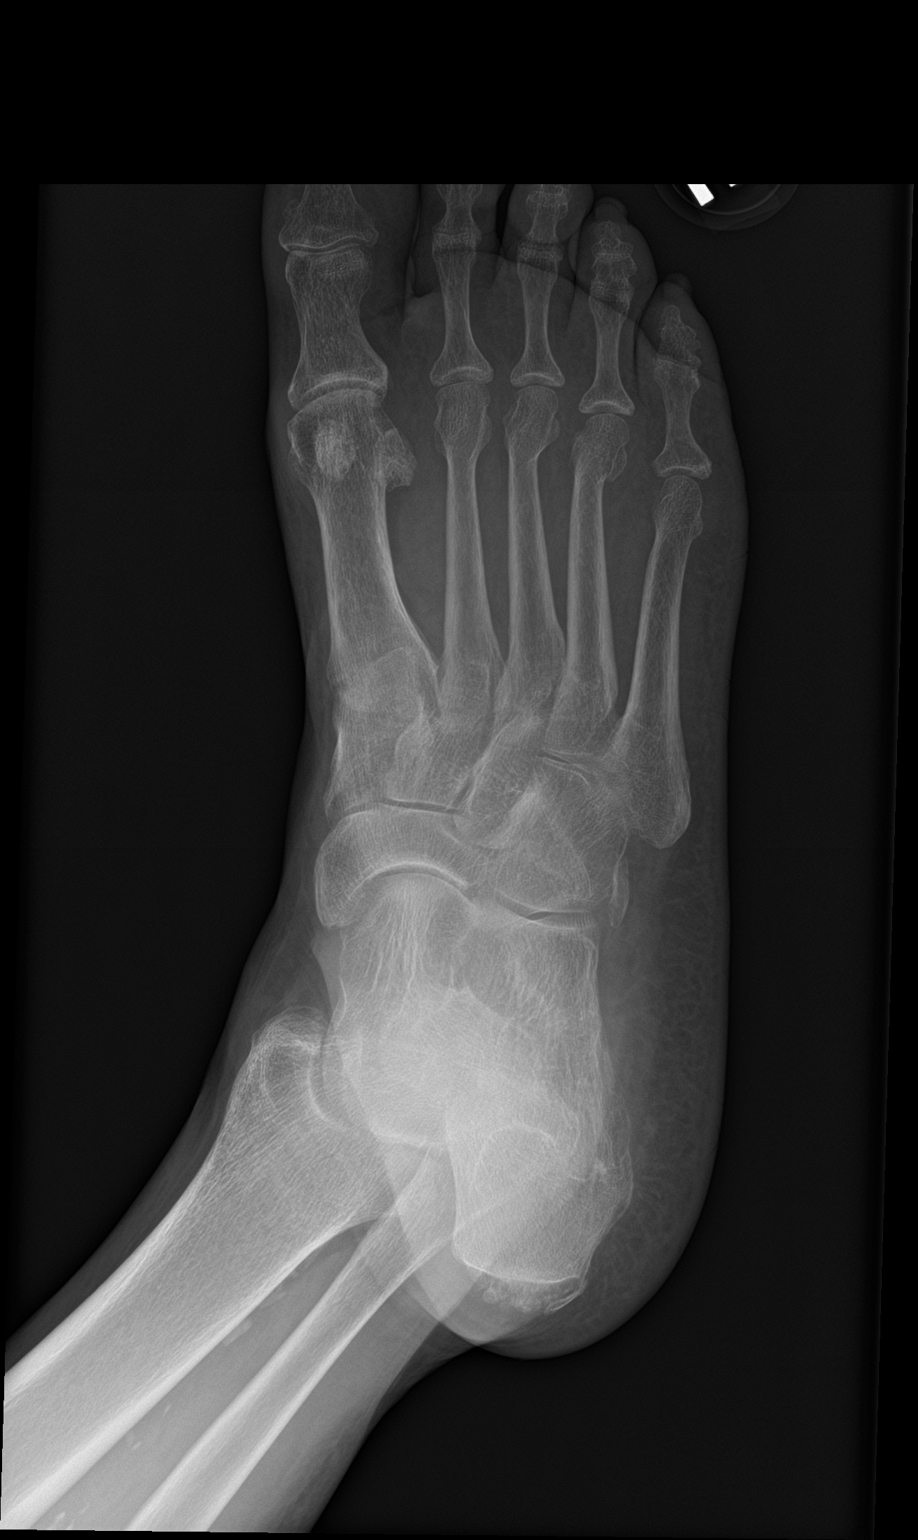

[foot lat]
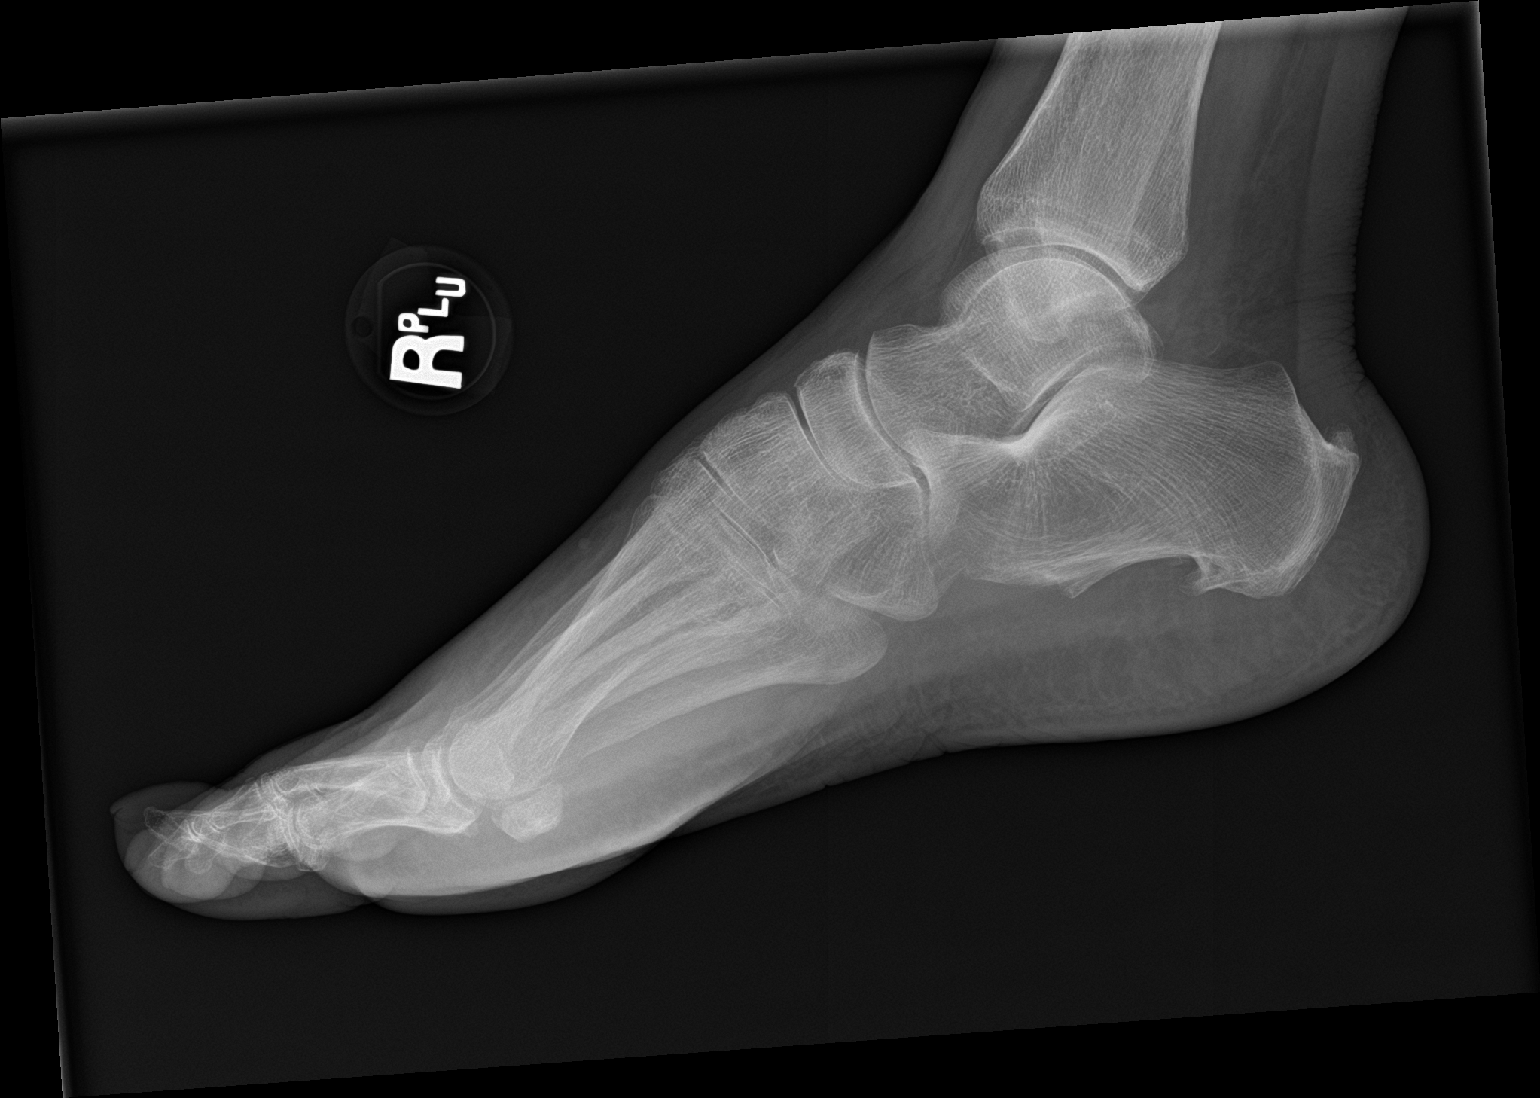

[3 of 3 positions shown; findings below may reference images not displayed]

FINDINGS: Bones appear demineralized. Acute nondisplaced fracture involving
the neck of the fourth metatarsal. No subluxation. Plantar calcaneal
spur
IMPRESSION: Acute nondisplaced fracture involving the neck of the fourth
metatarsal.

## 2021-09-21 DIAGNOSIS — R0602 Shortness of breath: Secondary | ICD-10-CM | POA: Diagnosis not present

## 2021-09-22 ENCOUNTER — Other Ambulatory Visit: Payer: Self-pay | Admitting: Family Medicine

## 2021-09-22 DIAGNOSIS — I10 Essential (primary) hypertension: Secondary | ICD-10-CM

## 2021-09-23 DIAGNOSIS — D689 Coagulation defect, unspecified: Secondary | ICD-10-CM | POA: Diagnosis not present

## 2021-09-23 DIAGNOSIS — N2581 Secondary hyperparathyroidism of renal origin: Secondary | ICD-10-CM | POA: Diagnosis not present

## 2021-09-23 DIAGNOSIS — N186 End stage renal disease: Secondary | ICD-10-CM | POA: Diagnosis not present

## 2021-09-23 DIAGNOSIS — Z992 Dependence on renal dialysis: Secondary | ICD-10-CM | POA: Diagnosis not present

## 2021-09-25 ENCOUNTER — Telehealth: Payer: Medicare HMO

## 2021-09-25 ENCOUNTER — Telehealth: Payer: Self-pay

## 2021-09-25 DIAGNOSIS — D689 Coagulation defect, unspecified: Secondary | ICD-10-CM | POA: Diagnosis not present

## 2021-09-25 DIAGNOSIS — N2581 Secondary hyperparathyroidism of renal origin: Secondary | ICD-10-CM | POA: Diagnosis not present

## 2021-09-25 DIAGNOSIS — N186 End stage renal disease: Secondary | ICD-10-CM | POA: Diagnosis not present

## 2021-09-25 DIAGNOSIS — Z992 Dependence on renal dialysis: Secondary | ICD-10-CM | POA: Diagnosis not present

## 2021-09-25 NOTE — Telephone Encounter (Signed)
  Care Management   Follow Up Note   09/25/2021 Name: [name] [name] MRN: [id number] DOB: 1955/07/13   Referred by: Midge Minium, MD Reason for referral : Chronic Care Management (RNCM: Follow up Outreach Chronic Care Management and coordination needs)   A second unsuccessful telephone outreach was attempted today. The patient was referred to the case management team for assistance with care management and care coordination.   Follow Up Plan: The care management team will reach out to the patient again over the next 30 days.   Peter Garter RN, Jackquline Denmark, CDE Care Management Coordinator [facility] [phone]

## 2021-09-27 DIAGNOSIS — N186 End stage renal disease: Secondary | ICD-10-CM | POA: Diagnosis not present

## 2021-09-27 DIAGNOSIS — Z992 Dependence on renal dialysis: Secondary | ICD-10-CM | POA: Diagnosis not present

## 2021-09-27 DIAGNOSIS — D689 Coagulation defect, unspecified: Secondary | ICD-10-CM | POA: Diagnosis not present

## 2021-09-27 DIAGNOSIS — N2581 Secondary hyperparathyroidism of renal origin: Secondary | ICD-10-CM | POA: Diagnosis not present

## 2021-09-30 DIAGNOSIS — N186 End stage renal disease: Secondary | ICD-10-CM | POA: Diagnosis not present

## 2021-09-30 DIAGNOSIS — N2581 Secondary hyperparathyroidism of renal origin: Secondary | ICD-10-CM | POA: Diagnosis not present

## 2021-09-30 DIAGNOSIS — L299 Pruritus, unspecified: Secondary | ICD-10-CM | POA: Diagnosis not present

## 2021-09-30 DIAGNOSIS — D689 Coagulation defect, unspecified: Secondary | ICD-10-CM | POA: Diagnosis not present

## 2021-09-30 DIAGNOSIS — Z992 Dependence on renal dialysis: Secondary | ICD-10-CM | POA: Diagnosis not present

## 2021-10-01 DIAGNOSIS — D689 Coagulation defect, unspecified: Secondary | ICD-10-CM | POA: Diagnosis not present

## 2021-10-01 DIAGNOSIS — Z992 Dependence on renal dialysis: Secondary | ICD-10-CM | POA: Diagnosis not present

## 2021-10-01 DIAGNOSIS — L299 Pruritus, unspecified: Secondary | ICD-10-CM | POA: Diagnosis not present

## 2021-10-01 DIAGNOSIS — N186 End stage renal disease: Secondary | ICD-10-CM | POA: Diagnosis not present

## 2021-10-01 DIAGNOSIS — N2581 Secondary hyperparathyroidism of renal origin: Secondary | ICD-10-CM | POA: Diagnosis not present

## 2021-10-02 DIAGNOSIS — N2581 Secondary hyperparathyroidism of renal origin: Secondary | ICD-10-CM | POA: Diagnosis not present

## 2021-10-02 DIAGNOSIS — Z992 Dependence on renal dialysis: Secondary | ICD-10-CM | POA: Diagnosis not present

## 2021-10-02 DIAGNOSIS — L299 Pruritus, unspecified: Secondary | ICD-10-CM | POA: Diagnosis not present

## 2021-10-02 DIAGNOSIS — D689 Coagulation defect, unspecified: Secondary | ICD-10-CM | POA: Diagnosis not present

## 2021-10-02 DIAGNOSIS — G609 Hereditary and idiopathic neuropathy, unspecified: Secondary | ICD-10-CM | POA: Diagnosis not present

## 2021-10-02 DIAGNOSIS — N186 End stage renal disease: Secondary | ICD-10-CM | POA: Diagnosis not present

## 2021-10-07 DIAGNOSIS — N2581 Secondary hyperparathyroidism of renal origin: Secondary | ICD-10-CM | POA: Diagnosis not present

## 2021-10-07 DIAGNOSIS — D689 Coagulation defect, unspecified: Secondary | ICD-10-CM | POA: Diagnosis not present

## 2021-10-07 DIAGNOSIS — E1129 Type 2 diabetes mellitus with other diabetic kidney complication: Secondary | ICD-10-CM | POA: Diagnosis not present

## 2021-10-07 DIAGNOSIS — L299 Pruritus, unspecified: Secondary | ICD-10-CM | POA: Diagnosis not present

## 2021-10-07 DIAGNOSIS — N186 End stage renal disease: Secondary | ICD-10-CM | POA: Diagnosis not present

## 2021-10-07 DIAGNOSIS — Z992 Dependence on renal dialysis: Secondary | ICD-10-CM | POA: Diagnosis not present

## 2021-10-09 DIAGNOSIS — D689 Coagulation defect, unspecified: Secondary | ICD-10-CM | POA: Diagnosis not present

## 2021-10-09 DIAGNOSIS — E1129 Type 2 diabetes mellitus with other diabetic kidney complication: Secondary | ICD-10-CM | POA: Diagnosis not present

## 2021-10-09 DIAGNOSIS — N186 End stage renal disease: Secondary | ICD-10-CM | POA: Diagnosis not present

## 2021-10-09 DIAGNOSIS — L299 Pruritus, unspecified: Secondary | ICD-10-CM | POA: Diagnosis not present

## 2021-10-09 DIAGNOSIS — N2581 Secondary hyperparathyroidism of renal origin: Secondary | ICD-10-CM | POA: Diagnosis not present

## 2021-10-09 DIAGNOSIS — Z992 Dependence on renal dialysis: Secondary | ICD-10-CM | POA: Diagnosis not present

## 2021-10-11 DIAGNOSIS — E1129 Type 2 diabetes mellitus with other diabetic kidney complication: Secondary | ICD-10-CM | POA: Diagnosis not present

## 2021-10-11 DIAGNOSIS — N186 End stage renal disease: Secondary | ICD-10-CM | POA: Diagnosis not present

## 2021-10-11 DIAGNOSIS — L299 Pruritus, unspecified: Secondary | ICD-10-CM | POA: Diagnosis not present

## 2021-10-11 DIAGNOSIS — N2581 Secondary hyperparathyroidism of renal origin: Secondary | ICD-10-CM | POA: Diagnosis not present

## 2021-10-11 DIAGNOSIS — D689 Coagulation defect, unspecified: Secondary | ICD-10-CM | POA: Diagnosis not present

## 2021-10-11 DIAGNOSIS — Z992 Dependence on renal dialysis: Secondary | ICD-10-CM | POA: Diagnosis not present

## 2021-10-14 DIAGNOSIS — D689 Coagulation defect, unspecified: Secondary | ICD-10-CM | POA: Diagnosis not present

## 2021-10-14 DIAGNOSIS — Z992 Dependence on renal dialysis: Secondary | ICD-10-CM | POA: Diagnosis not present

## 2021-10-14 DIAGNOSIS — N2581 Secondary hyperparathyroidism of renal origin: Secondary | ICD-10-CM | POA: Diagnosis not present

## 2021-10-14 DIAGNOSIS — N186 End stage renal disease: Secondary | ICD-10-CM | POA: Diagnosis not present

## 2021-10-16 ENCOUNTER — Ambulatory Visit: Payer: Self-pay

## 2021-10-16 DIAGNOSIS — N2581 Secondary hyperparathyroidism of renal origin: Secondary | ICD-10-CM | POA: Diagnosis not present

## 2021-10-16 DIAGNOSIS — D689 Coagulation defect, unspecified: Secondary | ICD-10-CM | POA: Diagnosis not present

## 2021-10-16 DIAGNOSIS — Z992 Dependence on renal dialysis: Secondary | ICD-10-CM | POA: Diagnosis not present

## 2021-10-16 DIAGNOSIS — N186 End stage renal disease: Secondary | ICD-10-CM | POA: Diagnosis not present

## 2021-10-16 DIAGNOSIS — I10 Essential (primary) hypertension: Secondary | ICD-10-CM

## 2021-10-16 NOTE — Patient Instructions (Signed)
Visit Information Case closed unable to contact maintain  Thank you for allowing me to share the care management and care coordination services that are available to you as part of your health plan and services through your primary care provider and medical home. Please reach out to me at [phone] if the care management/care coordination team may be of assistance to you in the future.  Peter Garter RN, Jackquline Denmark, CDE Care Management Coordinator [facility] [phone]

## 2021-10-16 NOTE — Chronic Care Management (AMB) (Signed)
Chronic Care Management   CCM RN Visit Note  10/16/2021 Name: [name] [name] MRN: [id number] DOB: 02-03-1956  Subjective: [name] [name] is a 66 y.o. year old female who is a primary care patient of [name] [name], [name] Millet, MD. The care management team was consulted for assistance with disease management and care coordination needs.    Engaged with patient by telephone for  Case closed unable to contact maintain   in response to provider referral for case management and/or care coordination services.   Consent to Services:  The patient was given information about Chronic Care Management services, agreed to services, and gave verbal consent prior to initiation of services.  Please see initial visit note for detailed documentation.   Patient agreed to services and verbal consent obtained.   Assessment: Review of patient past medical history, allergies, medications, health status, including review of consultants reports, laboratory and other test data, was performed as part of comprehensive evaluation and provision of chronic care management services.   SDOH (Social Determinants of Health) assessments and interventions performed:    CCM Care Plan  Allergies  Allergen Reactions   Lisinopril Shortness Of Breath, Swelling and Other (See Comments)    Throat irritation also. Patient takes losartan and tolerates fine.   Lyrica [Pregabalin] Other (See Comments)    Causing severe myoclonic jerking of UE's in march 2022, dc'd   Daypro [Oxaprozin] Hives and Dermatitis   Pregabalin Er     Other reaction(s): Jerks    Outpatient Encounter Medications as of 10/16/2021  Medication Sig   acetaminophen (TYLENOL) 650 MG CR tablet Take 650-1,300 mg by mouth every 8 (eight) hours as needed for pain.    albuterol (VENTOLIN HFA) 108 (90 Base) MCG/ACT inhaler INHALE 2 PUFFS INTO THE LUNGS EVERY 4 (FOUR) HOURS AS NEEDED FOR WHEEZING OR SHORTNESS OF BREATH.   amLODipine (NORVASC) 10 MG tablet TAKE 1  TABLET BY MOUTH EVERY DAY   atorvastatin (LIPITOR) 40 MG tablet Take 1 tablet (40 mg total) by mouth daily. Please make yearly appt with Dr. Acie Fredrickson for April 2022 for future refills. Thank you 1st attempt   blood glucose meter kit and supplies KIT Dispense based on patient and insurance preference. Patient should test sugars twice daily as directed. Dx. E11.9   DULoxetine (CYMBALTA) 20 MG capsule TAKE 1 CAPSULE BY MOUTH EVERY DAY   hydrALAZINE (APRESOLINE) 100 MG tablet TAKE 1 TABLET BY MOUTH TWICE A DAY   HYDROcodone-acetaminophen (NORCO/VICODIN) 5-325 MG tablet Take 1 tablet by mouth every 6 (six) hours as needed for moderate pain.   isosorbide dinitrate (ISORDIL) 20 MG tablet TAKE 1 TABLET BY MOUTH THREE TIMES A DAY   losartan (COZAAR) 50 MG tablet TAKE 1 TABLET BY MOUTH EVERY DAY   multivitamin (RENA-VIT) TABS tablet Take 1 tablet by mouth every morning.    omeprazole (PRILOSEC) 40 MG capsule TAKE 1 CAPSULE BY MOUTH EVERY DAY   ONETOUCH ULTRA test strip USE AS INSTRUCTED TO TEST SUGARS TWICE DAILY. DX. E11.9   pregabalin (LYRICA) 100 MG capsule Take 100 mg by mouth 3 (three) times daily.   QUEtiapine (SEROQUEL) 100 MG tablet TAKE 1 TABLET BY MOUTH EVERYDAY AT BEDTIME   traZODone (DESYREL) 50 MG tablet Take 50 mg by mouth at bedtime as needed for sleep.    No facility-administered encounter medications on file as of 10/16/2021.    Patient Active Problem List   Diagnosis Date Noted   Allergy, unspecified, initial encounter 01/23/2021   Anaphylactic shock, unspecified,  initial encounter 01/23/2021   Gout 07/17/2020   Chronic pain disorder 06/11/2020   Depression with anxiety 44/62/8638   Complication of vascular dialysis catheter 04/13/2020   Shortness of breath 01/05/2020   Abnormal findings on diagnostic imaging of lung 01/05/2020   Chronic respiratory failure with hypoxia ([place]) 01/05/2020   Peripheral neuropathy 09/22/2019   Tumor 07/28/2019   History of seizures 07/28/2019    Stress incontinence 12/24/2017   Cough variant asthma 12/24/2017   Tremor of left hand 12/24/2017   History of internal jugular thrombosis 03/26/2017   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   Anemia 07/08/2016   Abnormal brain MRI 07/03/2016   Fasciculation 06/18/2016   Carotid artery disease ([place])    Hyperlipidemia associated with type 2 diabetes mellitus ([place]) 06/28/2015   Diabetes mellitus type II, controlled ([place]) 06/28/2015   Thoracic ascending aortic aneurysm (41 mm on Echo 06/2013) 07/06/2013   ESRD on dialysis ([place]) 06/16/2013   History of renal transplant 06/16/2013   GERD (gastroesophageal reflux disease) 08/13/2009   OTH&UNSPEC NONINFECTIOUS GASTROENTERITIS&COLITIS 08/13/2009   Essential hypertension 04/25/2009   Hemorrhoids 04/25/2009   WEIGHT LOSS 04/25/2009   NAUSEA AND VOMITING 04/25/2009   PERSONAL HX COLONIC POLYPS 04/25/2009    Conditions to be addressed/monitored:HTN, DMII, and ESRD  Care Plan : RN Care Manager Plan of Care  Updates made by [name] Ped, RN since 10/16/2021 12:00 AM  Completed 10/16/2021   Problem: Chronic Disease Management and Care Coordination Needs (HTN,DM2, ESRD, HLD Resolved 10/16/2021  Priority: High     Long-Range Goal: Establish Plan of Care for Chronic Disease Management Needs (HTN,DM2, ESRD, HLD Completed 10/16/2021  Start Date: 06/12/2021  Expected End Date: 06/12/2022  Priority: High  Note:   Case closed unable to contact maintain  Current Barriers:  Knowledge Deficits related to plan of care for management of HTN, HLD, DMII, and ESRD  Care Coordination needs related to Housing barriers Chronic Disease Management support and education needs related to HTN, HLD, DMII, and ESRD  Spoke with daughter [name] [name].  States pt has been doing well.  States she drives herself to dialysis.  States that dialysis checks her CBG and B/P so pt does not check at home.  States  her son lives with her and he does the cooking and tries to cook healthy.  States that their rent has become expensive.  RNCM Clinical Goal(s):  Patient will verbalize understanding of plan for management of HTN, HLD, DMII, and ESRD as evidenced by voiced adherence to plan of care verbalize basic understanding of  HTN, HLD, DMII, and ESRD disease process and self health management plan as evidenced by voiced understanding and teach back  take all medications exactly as prescribed and will call provider for medication related questions as evidenced by dispense report and pt verbalization  attend all scheduled medical appointments: Dr. Birdie [name] as evidenced by medical records demonstrate Improved adherence to prescribed treatment plan for HTN, HLD, DMII, and ESRD as evidenced by readings within limits, voiced adherence to plan of care continue to work with RN Care Manager to address care management and care coordination needs related to  HTN, HLD, DMII, and ESRD as evidenced by adherence to CM Team Scheduled appointments work with community resource care guide to address needs related to  Housing barriers as evidenced by patient and/or community resource care guide support through collaboration with Consulting civil engineer, provider, and care team.   Interventions: 1:1 collaboration with primary care provider regarding development  and update of comprehensive plan of care as evidenced by provider attestation and co-signature Inter-disciplinary care team collaboration (see longitudinal plan of care) Evaluation of current treatment plan related to  self management and patient's adherence to plan as established by provider    Chronic Kidney Disease Interventions:  (Status:   Case closed unable to contact maintain  ) Long Term Goal Assessed the Designated Party Release [name] Sages understanding of chronic kidney disease    Evaluation of current treatment plan related to chronic kidney disease self management  and patient's adherence to plan as established by provider      Provided education to patient re: stroke prevention, s/s of heart attack and stroke    Reviewed medications with patient and discussed importance of compliance    Advised patient, providing education and rationale, to monitor blood pressure daily and record, calling PCP for findings outside established parameters    Last practice recorded BP readings:  BP Readings from Last 3 Encounters:  05/02/21 134/82  07/17/20 (!) 150/90  07/14/20 (!) 152/85  Most recent eGFR/CrCl: No results found for: EGFR  No components found for: CRCL    Diabetes Interventions:  (Status:  Case closed unable to contact maintain ) Long Term Goal Assessed patient's understanding of A1c goal: <7% Provided education to patient about basic DM disease process Reviewed medications with patient and discussed importance of medication adherence Discussed plans with patient for ongoing care management follow up and provided patient with direct contact information for care management team Advised patient, providing education and rationale, to check cbg daily and record, calling provider for findings outside established parameters Referral made to community resources care guide team for assistance with affordable housing resources Lab Results  Component Value Date   HGBA1C 6.1 05/02/2021   Hyperlipidemia Interventions:  (Status:   Case closed unable to contact maintain  ) Long Term Goal Medication review performed; medication list updated in electronic medical record.  Provider established cholesterol goals reviewed Counseled on importance of regular laboratory monitoring as prescribed Reviewed role and benefits of statin for ASCVD risk reduction  Hypertension Interventions:  (Status:   Case closed unable to contact maintain  ) Long Term Goal Last practice recorded BP readings:  BP Readings from Last 3 Encounters:  05/02/21 134/82  07/17/20 (!) 150/90   07/14/20 (!) 152/85  Most recent eGFR/CrCl: No results found for: EGFR  No components found for: CRCL  Evaluation of current treatment plan related to hypertension self management and patient's adherence to plan as established by provider Provided education to patient re: stroke prevention, s/s of heart attack and stroke Discussed plans with patient for ongoing care management follow up and provided patient with direct contact information for care management team Advised patient, providing education and rationale, to monitor blood pressure daily and record, calling PCP for findings outside established parameters Provided education on prescribed diet low sodium low CHO renal  Patient Goals/Self-Care Activities: Take all medications as prescribed Attend all scheduled provider appointments Call provider office for new concerns or questions  schedule appointment with eye doctor check blood sugar at prescribed times: once daily and when you have symptoms of low or high blood sugar check feet daily for cuts, sores or redness fill half of plate with vegetables limit fast food meals to no more than 1 per week manage portion size switch to sugar-free drinks check blood pressure daily write blood pressure results in a log or diary take blood pressure log to all doctor appointments call  doctor for signs and symptoms of high blood pressure eat more whole grains, fruits and vegetables, lean meats and healthy fats call for medicine refill 2 or 3 days before it runs out take all medications exactly as prescribed call doctor with any symptoms you believe are related to your medicine  Follow Up Plan:  The patient has been provided with contact information for the care management team and has been advised to call with any health related questions or concerns.  No further follow up required: Case closed unable to contact maintain        Plan:The patient has been provided with contact information for  the care management team and has been advised to call with any health related questions or concerns.  No further follow up required: Case closed unable to contact maintain  [place], [facility], CDE Care Management Coordinator [facility] [phone]

## 2021-10-18 ENCOUNTER — Other Ambulatory Visit: Payer: Self-pay | Admitting: Family Medicine

## 2021-10-18 DIAGNOSIS — N2581 Secondary hyperparathyroidism of renal origin: Secondary | ICD-10-CM | POA: Diagnosis not present

## 2021-10-18 DIAGNOSIS — N186 End stage renal disease: Secondary | ICD-10-CM | POA: Diagnosis not present

## 2021-10-18 DIAGNOSIS — Z992 Dependence on renal dialysis: Secondary | ICD-10-CM | POA: Diagnosis not present

## 2021-10-18 DIAGNOSIS — I10 Essential (primary) hypertension: Secondary | ICD-10-CM

## 2021-10-18 DIAGNOSIS — D689 Coagulation defect, unspecified: Secondary | ICD-10-CM | POA: Diagnosis not present

## 2021-10-21 DIAGNOSIS — N2581 Secondary hyperparathyroidism of renal origin: Secondary | ICD-10-CM | POA: Diagnosis not present

## 2021-10-21 DIAGNOSIS — Z992 Dependence on renal dialysis: Secondary | ICD-10-CM | POA: Diagnosis not present

## 2021-10-21 DIAGNOSIS — N186 End stage renal disease: Secondary | ICD-10-CM | POA: Diagnosis not present

## 2021-10-21 DIAGNOSIS — I150 Renovascular hypertension: Secondary | ICD-10-CM | POA: Diagnosis not present

## 2021-10-21 DIAGNOSIS — L298 Other pruritus: Secondary | ICD-10-CM | POA: Diagnosis not present

## 2021-10-21 DIAGNOSIS — D689 Coagulation defect, unspecified: Secondary | ICD-10-CM | POA: Diagnosis not present

## 2021-10-22 DIAGNOSIS — R0602 Shortness of breath: Secondary | ICD-10-CM | POA: Diagnosis not present

## 2021-10-23 DIAGNOSIS — Z992 Dependence on renal dialysis: Secondary | ICD-10-CM | POA: Diagnosis not present

## 2021-10-23 DIAGNOSIS — D689 Coagulation defect, unspecified: Secondary | ICD-10-CM | POA: Diagnosis not present

## 2021-10-23 DIAGNOSIS — L298 Other pruritus: Secondary | ICD-10-CM | POA: Diagnosis not present

## 2021-10-23 DIAGNOSIS — N2581 Secondary hyperparathyroidism of renal origin: Secondary | ICD-10-CM | POA: Diagnosis not present

## 2021-10-23 DIAGNOSIS — N186 End stage renal disease: Secondary | ICD-10-CM | POA: Diagnosis not present

## 2021-10-25 DIAGNOSIS — N2581 Secondary hyperparathyroidism of renal origin: Secondary | ICD-10-CM | POA: Diagnosis not present

## 2021-10-25 DIAGNOSIS — L298 Other pruritus: Secondary | ICD-10-CM | POA: Diagnosis not present

## 2021-10-25 DIAGNOSIS — Z992 Dependence on renal dialysis: Secondary | ICD-10-CM | POA: Diagnosis not present

## 2021-10-25 DIAGNOSIS — N186 End stage renal disease: Secondary | ICD-10-CM | POA: Diagnosis not present

## 2021-10-25 DIAGNOSIS — D689 Coagulation defect, unspecified: Secondary | ICD-10-CM | POA: Diagnosis not present

## 2021-10-28 ENCOUNTER — Encounter: Payer: Self-pay | Admitting: Cardiovascular Disease

## 2021-10-28 DIAGNOSIS — N2581 Secondary hyperparathyroidism of renal origin: Secondary | ICD-10-CM | POA: Diagnosis not present

## 2021-10-28 DIAGNOSIS — N186 End stage renal disease: Secondary | ICD-10-CM | POA: Diagnosis not present

## 2021-10-28 DIAGNOSIS — D689 Coagulation defect, unspecified: Secondary | ICD-10-CM | POA: Diagnosis not present

## 2021-10-28 DIAGNOSIS — Z992 Dependence on renal dialysis: Secondary | ICD-10-CM | POA: Diagnosis not present

## 2021-10-28 NOTE — Progress Notes (Signed)
This encounter was created in error - please disregard.

## 2021-10-29 ENCOUNTER — Encounter: Payer: Medicare HMO | Admitting: Cardiovascular Disease

## 2021-10-30 DIAGNOSIS — D689 Coagulation defect, unspecified: Secondary | ICD-10-CM | POA: Diagnosis not present

## 2021-10-30 DIAGNOSIS — Z992 Dependence on renal dialysis: Secondary | ICD-10-CM | POA: Diagnosis not present

## 2021-10-30 DIAGNOSIS — N2581 Secondary hyperparathyroidism of renal origin: Secondary | ICD-10-CM | POA: Diagnosis not present

## 2021-10-30 DIAGNOSIS — N186 End stage renal disease: Secondary | ICD-10-CM | POA: Diagnosis not present

## 2021-11-01 DIAGNOSIS — N2581 Secondary hyperparathyroidism of renal origin: Secondary | ICD-10-CM | POA: Diagnosis not present

## 2021-11-01 DIAGNOSIS — Z992 Dependence on renal dialysis: Secondary | ICD-10-CM | POA: Diagnosis not present

## 2021-11-01 DIAGNOSIS — D689 Coagulation defect, unspecified: Secondary | ICD-10-CM | POA: Diagnosis not present

## 2021-11-01 DIAGNOSIS — N186 End stage renal disease: Secondary | ICD-10-CM | POA: Diagnosis not present

## 2021-11-04 DIAGNOSIS — Z992 Dependence on renal dialysis: Secondary | ICD-10-CM | POA: Diagnosis not present

## 2021-11-04 DIAGNOSIS — E1129 Type 2 diabetes mellitus with other diabetic kidney complication: Secondary | ICD-10-CM | POA: Diagnosis not present

## 2021-11-04 DIAGNOSIS — D689 Coagulation defect, unspecified: Secondary | ICD-10-CM | POA: Diagnosis not present

## 2021-11-04 DIAGNOSIS — N2581 Secondary hyperparathyroidism of renal origin: Secondary | ICD-10-CM | POA: Diagnosis not present

## 2021-11-04 DIAGNOSIS — L299 Pruritus, unspecified: Secondary | ICD-10-CM | POA: Diagnosis not present

## 2021-11-04 DIAGNOSIS — N186 End stage renal disease: Secondary | ICD-10-CM | POA: Diagnosis not present

## 2021-11-06 DIAGNOSIS — N186 End stage renal disease: Secondary | ICD-10-CM | POA: Diagnosis not present

## 2021-11-06 DIAGNOSIS — Z992 Dependence on renal dialysis: Secondary | ICD-10-CM | POA: Diagnosis not present

## 2021-11-06 DIAGNOSIS — E1129 Type 2 diabetes mellitus with other diabetic kidney complication: Secondary | ICD-10-CM | POA: Diagnosis not present

## 2021-11-06 DIAGNOSIS — L299 Pruritus, unspecified: Secondary | ICD-10-CM | POA: Diagnosis not present

## 2021-11-06 DIAGNOSIS — N2581 Secondary hyperparathyroidism of renal origin: Secondary | ICD-10-CM | POA: Diagnosis not present

## 2021-11-06 DIAGNOSIS — D689 Coagulation defect, unspecified: Secondary | ICD-10-CM | POA: Diagnosis not present

## 2021-11-07 ENCOUNTER — Telehealth: Payer: Self-pay | Admitting: Family Medicine

## 2021-11-07 NOTE — Telephone Encounter (Signed)
Left message for patient to call back and schedule Medicare Annual Wellness Visit (AWV).   Please offer to do virtually or by telephone.  Left office number and my jabber [phone].  Last AWV:09/27/2019  Please schedule at anytime with Nurse Health Advisor.

## 2021-11-08 DIAGNOSIS — N2581 Secondary hyperparathyroidism of renal origin: Secondary | ICD-10-CM | POA: Diagnosis not present

## 2021-11-08 DIAGNOSIS — L299 Pruritus, unspecified: Secondary | ICD-10-CM | POA: Diagnosis not present

## 2021-11-08 DIAGNOSIS — Z992 Dependence on renal dialysis: Secondary | ICD-10-CM | POA: Diagnosis not present

## 2021-11-08 DIAGNOSIS — D689 Coagulation defect, unspecified: Secondary | ICD-10-CM | POA: Diagnosis not present

## 2021-11-08 DIAGNOSIS — E1129 Type 2 diabetes mellitus with other diabetic kidney complication: Secondary | ICD-10-CM | POA: Diagnosis not present

## 2021-11-08 DIAGNOSIS — N186 End stage renal disease: Secondary | ICD-10-CM | POA: Diagnosis not present

## 2021-11-11 DIAGNOSIS — D689 Coagulation defect, unspecified: Secondary | ICD-10-CM | POA: Diagnosis not present

## 2021-11-11 DIAGNOSIS — L299 Pruritus, unspecified: Secondary | ICD-10-CM | POA: Diagnosis not present

## 2021-11-11 DIAGNOSIS — N2581 Secondary hyperparathyroidism of renal origin: Secondary | ICD-10-CM | POA: Diagnosis not present

## 2021-11-11 DIAGNOSIS — Z992 Dependence on renal dialysis: Secondary | ICD-10-CM | POA: Diagnosis not present

## 2021-11-11 DIAGNOSIS — N186 End stage renal disease: Secondary | ICD-10-CM | POA: Diagnosis not present

## 2021-11-13 DIAGNOSIS — N186 End stage renal disease: Secondary | ICD-10-CM | POA: Diagnosis not present

## 2021-11-13 DIAGNOSIS — N2581 Secondary hyperparathyroidism of renal origin: Secondary | ICD-10-CM | POA: Diagnosis not present

## 2021-11-13 DIAGNOSIS — D689 Coagulation defect, unspecified: Secondary | ICD-10-CM | POA: Diagnosis not present

## 2021-11-13 DIAGNOSIS — L299 Pruritus, unspecified: Secondary | ICD-10-CM | POA: Diagnosis not present

## 2021-11-13 DIAGNOSIS — Z992 Dependence on renal dialysis: Secondary | ICD-10-CM | POA: Diagnosis not present

## 2021-11-15 DIAGNOSIS — D689 Coagulation defect, unspecified: Secondary | ICD-10-CM | POA: Diagnosis not present

## 2021-11-15 DIAGNOSIS — N186 End stage renal disease: Secondary | ICD-10-CM | POA: Diagnosis not present

## 2021-11-15 DIAGNOSIS — L299 Pruritus, unspecified: Secondary | ICD-10-CM | POA: Diagnosis not present

## 2021-11-15 DIAGNOSIS — N2581 Secondary hyperparathyroidism of renal origin: Secondary | ICD-10-CM | POA: Diagnosis not present

## 2021-11-15 DIAGNOSIS — Z992 Dependence on renal dialysis: Secondary | ICD-10-CM | POA: Diagnosis not present

## 2021-11-18 DIAGNOSIS — Z992 Dependence on renal dialysis: Secondary | ICD-10-CM | POA: Diagnosis not present

## 2021-11-18 DIAGNOSIS — N186 End stage renal disease: Secondary | ICD-10-CM | POA: Diagnosis not present

## 2021-11-18 DIAGNOSIS — N2581 Secondary hyperparathyroidism of renal origin: Secondary | ICD-10-CM | POA: Diagnosis not present

## 2021-11-18 DIAGNOSIS — D689 Coagulation defect, unspecified: Secondary | ICD-10-CM | POA: Diagnosis not present

## 2021-11-18 DIAGNOSIS — L299 Pruritus, unspecified: Secondary | ICD-10-CM | POA: Diagnosis not present

## 2021-11-19 ENCOUNTER — Ambulatory Visit: Payer: Medicare HMO | Admitting: Family Medicine

## 2021-11-20 DIAGNOSIS — D689 Coagulation defect, unspecified: Secondary | ICD-10-CM | POA: Diagnosis not present

## 2021-11-20 DIAGNOSIS — L299 Pruritus, unspecified: Secondary | ICD-10-CM | POA: Diagnosis not present

## 2021-11-20 DIAGNOSIS — Z992 Dependence on renal dialysis: Secondary | ICD-10-CM | POA: Diagnosis not present

## 2021-11-20 DIAGNOSIS — N2581 Secondary hyperparathyroidism of renal origin: Secondary | ICD-10-CM | POA: Diagnosis not present

## 2021-11-20 DIAGNOSIS — N186 End stage renal disease: Secondary | ICD-10-CM | POA: Diagnosis not present

## 2021-11-21 DIAGNOSIS — N186 End stage renal disease: Secondary | ICD-10-CM | POA: Diagnosis not present

## 2021-11-21 DIAGNOSIS — I150 Renovascular hypertension: Secondary | ICD-10-CM | POA: Diagnosis not present

## 2021-11-21 DIAGNOSIS — Z992 Dependence on renal dialysis: Secondary | ICD-10-CM | POA: Diagnosis not present

## 2021-11-22 ENCOUNTER — Telehealth: Payer: Self-pay

## 2021-11-22 DIAGNOSIS — R0602 Shortness of breath: Secondary | ICD-10-CM | POA: Diagnosis not present

## 2021-11-22 DIAGNOSIS — D689 Coagulation defect, unspecified: Secondary | ICD-10-CM | POA: Diagnosis not present

## 2021-11-22 DIAGNOSIS — Z992 Dependence on renal dialysis: Secondary | ICD-10-CM | POA: Diagnosis not present

## 2021-11-22 DIAGNOSIS — N186 End stage renal disease: Secondary | ICD-10-CM | POA: Diagnosis not present

## 2021-11-22 DIAGNOSIS — N2581 Secondary hyperparathyroidism of renal origin: Secondary | ICD-10-CM | POA: Diagnosis not present

## 2021-11-22 DIAGNOSIS — L299 Pruritus, unspecified: Secondary | ICD-10-CM | POA: Diagnosis not present

## 2021-11-22 NOTE — Telephone Encounter (Signed)
Transportation forms have been emailed to [email or web address] for [employer] . Copies are in scan

## 2021-11-25 DIAGNOSIS — D689 Coagulation defect, unspecified: Secondary | ICD-10-CM | POA: Diagnosis not present

## 2021-11-25 DIAGNOSIS — N2581 Secondary hyperparathyroidism of renal origin: Secondary | ICD-10-CM | POA: Diagnosis not present

## 2021-11-25 DIAGNOSIS — N186 End stage renal disease: Secondary | ICD-10-CM | POA: Diagnosis not present

## 2021-11-25 DIAGNOSIS — Z992 Dependence on renal dialysis: Secondary | ICD-10-CM | POA: Diagnosis not present

## 2021-11-25 DIAGNOSIS — L299 Pruritus, unspecified: Secondary | ICD-10-CM | POA: Diagnosis not present

## 2021-11-27 DIAGNOSIS — N2581 Secondary hyperparathyroidism of renal origin: Secondary | ICD-10-CM | POA: Diagnosis not present

## 2021-11-27 DIAGNOSIS — D689 Coagulation defect, unspecified: Secondary | ICD-10-CM | POA: Diagnosis not present

## 2021-11-27 DIAGNOSIS — Z992 Dependence on renal dialysis: Secondary | ICD-10-CM | POA: Diagnosis not present

## 2021-11-27 DIAGNOSIS — N186 End stage renal disease: Secondary | ICD-10-CM | POA: Diagnosis not present

## 2021-11-27 DIAGNOSIS — L299 Pruritus, unspecified: Secondary | ICD-10-CM | POA: Diagnosis not present

## 2021-11-29 DIAGNOSIS — N2581 Secondary hyperparathyroidism of renal origin: Secondary | ICD-10-CM | POA: Diagnosis not present

## 2021-11-29 DIAGNOSIS — L299 Pruritus, unspecified: Secondary | ICD-10-CM | POA: Diagnosis not present

## 2021-11-29 DIAGNOSIS — D689 Coagulation defect, unspecified: Secondary | ICD-10-CM | POA: Diagnosis not present

## 2021-11-29 DIAGNOSIS — Z992 Dependence on renal dialysis: Secondary | ICD-10-CM | POA: Diagnosis not present

## 2021-11-29 DIAGNOSIS — N186 End stage renal disease: Secondary | ICD-10-CM | POA: Diagnosis not present

## 2021-12-02 DIAGNOSIS — N2581 Secondary hyperparathyroidism of renal origin: Secondary | ICD-10-CM | POA: Diagnosis not present

## 2021-12-02 DIAGNOSIS — D689 Coagulation defect, unspecified: Secondary | ICD-10-CM | POA: Diagnosis not present

## 2021-12-02 DIAGNOSIS — Z992 Dependence on renal dialysis: Secondary | ICD-10-CM | POA: Diagnosis not present

## 2021-12-02 DIAGNOSIS — N186 End stage renal disease: Secondary | ICD-10-CM | POA: Diagnosis not present

## 2021-12-02 DIAGNOSIS — L298 Other pruritus: Secondary | ICD-10-CM | POA: Diagnosis not present

## 2021-12-02 DIAGNOSIS — L299 Pruritus, unspecified: Secondary | ICD-10-CM | POA: Diagnosis not present

## 2021-12-03 ENCOUNTER — Ambulatory Visit (INDEPENDENT_AMBULATORY_CARE_PROVIDER_SITE_OTHER): Payer: Medicare HMO | Admitting: Family Medicine

## 2021-12-03 ENCOUNTER — Encounter: Payer: Self-pay | Admitting: Family Medicine

## 2021-12-03 ENCOUNTER — Other Ambulatory Visit: Payer: Self-pay

## 2021-12-03 VITALS — BP 146/72 | HR 86 | Temp 98.4°F | Resp 18 | Ht 69.0 in | Wt 182.2 lb

## 2021-12-03 DIAGNOSIS — I10 Essential (primary) hypertension: Secondary | ICD-10-CM | POA: Diagnosis not present

## 2021-12-03 DIAGNOSIS — Z1211 Encounter for screening for malignant neoplasm of colon: Secondary | ICD-10-CM | POA: Diagnosis not present

## 2021-12-03 DIAGNOSIS — Z992 Dependence on renal dialysis: Secondary | ICD-10-CM | POA: Diagnosis not present

## 2021-12-03 DIAGNOSIS — E785 Hyperlipidemia, unspecified: Secondary | ICD-10-CM | POA: Diagnosis not present

## 2021-12-03 DIAGNOSIS — E1169 Type 2 diabetes mellitus with other specified complication: Secondary | ICD-10-CM

## 2021-12-03 DIAGNOSIS — Z23 Encounter for immunization: Secondary | ICD-10-CM | POA: Diagnosis not present

## 2021-12-03 DIAGNOSIS — E1122 Type 2 diabetes mellitus with diabetic chronic kidney disease: Secondary | ICD-10-CM

## 2021-12-03 DIAGNOSIS — N186 End stage renal disease: Secondary | ICD-10-CM | POA: Diagnosis not present

## 2021-12-03 LAB — CBC WITH DIFFERENTIAL/PLATELET
Basophils Absolute: 0.1 10*3/uL (ref 0.0–0.1)
Basophils Relative: 0.6 % (ref 0.0–3.0)
Eosinophils Absolute: 0.2 10*3/uL (ref 0.0–0.7)
Eosinophils Relative: 2.1 % (ref 0.0–5.0)
HCT: 34.4 % — ABNORMAL LOW (ref 36.0–46.0)
Hemoglobin: 11.1 g/dL — ABNORMAL LOW (ref 12.0–15.0)
Lymphocytes Relative: 24.5 % (ref 12.0–46.0)
Lymphs Abs: 2.5 10*3/uL (ref 0.7–4.0)
MCHC: 32.2 g/dL (ref 30.0–36.0)
MCV: 92.4 fl (ref 78.0–100.0)
Monocytes Absolute: 0.9 10*3/uL (ref 0.1–1.0)
Monocytes Relative: 8.9 % (ref 3.0–12.0)
Neutro Abs: 6.5 10*3/uL (ref 1.4–7.7)
Neutrophils Relative %: 63.9 % (ref 43.0–77.0)
Platelets: 170 10*3/uL (ref 150.0–400.0)
RBC: 3.72 Mil/uL — ABNORMAL LOW (ref 3.87–5.11)
RDW: 18 % — ABNORMAL HIGH (ref 11.5–15.5)
WBC: 10.1 10*3/uL (ref 4.0–10.5)

## 2021-12-03 LAB — HEPATIC FUNCTION PANEL
ALT: 12 U/L (ref 0–35)
AST: 14 U/L (ref 0–37)
Albumin: 4 g/dL (ref 3.5–5.2)
Alkaline Phosphatase: 73 U/L (ref 39–117)
Bilirubin, Direct: 0 mg/dL (ref 0.0–0.3)
Total Bilirubin: 0.4 mg/dL (ref 0.2–1.2)
Total Protein: 7.8 g/dL (ref 6.0–8.3)

## 2021-12-03 LAB — BASIC METABOLIC PANEL
BUN: 38 mg/dL — ABNORMAL HIGH (ref 6–23)
CO2: 32 mEq/L (ref 19–32)
Calcium: 9.9 mg/dL (ref 8.4–10.5)
Chloride: 96 mEq/L (ref 96–112)
Creatinine, Ser: 9.17 mg/dL (ref 0.40–1.20)
GFR: 4.19 mL/min — CL (ref >60.00)
Glucose, Bld: 109 mg/dL — ABNORMAL HIGH (ref 70–99)
Potassium: 4.6 mEq/L (ref 3.5–5.1)
Sodium: 140 mEq/L (ref 135–145)

## 2021-12-03 LAB — LIPID PANEL
Cholesterol: 252 mg/dL — ABNORMAL HIGH (ref 0–200)
HDL: 33 mg/dL — ABNORMAL LOW (ref >39.00)
NonHDL: 219.49
Total CHOL/HDL Ratio: 8
Triglycerides: 362 mg/dL — ABNORMAL HIGH (ref 0.0–149.0)
VLDL: 72.4 mg/dL — ABNORMAL HIGH (ref 0.0–40.0)

## 2021-12-03 LAB — TSH: TSH: 0.49 u[IU]/mL (ref 0.35–5.50)

## 2021-12-03 LAB — HEMOGLOBIN A1C: Hgb A1c MFr Bld: 7.2 % — ABNORMAL HIGH (ref 4.6–6.5)

## 2021-12-03 LAB — LDL CHOLESTEROL, DIRECT: Direct LDL: 134 mg/dL

## 2021-12-03 NOTE — Progress Notes (Unsigned)
   Subjective:    Patient ID: [name] [name], female    DOB: 08-Aug-1955, 66 y.o.   MRN: [id number]  HPI DM- chronic problem, currently diet controlled.  Due for eye exam, foot exam.  No sores or blisters on feet.  Pt is down 10 lbs since last visit.  Walking regularly.  + known neuropathy of feet.    HTN- chronic problem, on Amlodipine '10mg'$  daily, Hydralazine '100mg'$  BID, Losartan '50mg'$  daily.  No CP, SOB above baseline, HAs, visual changes, edema.  Hyperlipidemia- chronic problem, on Lipitor '40mg'$  daily.  No abd pain, N/V.   Review of Systems For ROS see HPI     Objective:   Physical Exam Vitals reviewed.  Constitutional:      General: She is not in acute distress.    Appearance: Normal appearance. She is well-developed. She is not ill-appearing.  HENT:     Head: Normocephalic and atraumatic.  Eyes:     Conjunctiva/sclera: Conjunctivae normal.     Pupils: Pupils are equal, round, and reactive to light.  Neck:     Thyroid: No thyromegaly.  Cardiovascular:     Rate and Rhythm: Normal rate and regular rhythm.     Heart sounds: Normal heart sounds. No murmur heard. Pulmonary:     Effort: Pulmonary effort is normal. No respiratory distress.     Breath sounds: Normal breath sounds.  Abdominal:     General: There is no distension.     Palpations: Abdomen is soft.     Tenderness: There is no abdominal tenderness.  Musculoskeletal:     Cervical back: Normal range of motion and neck supple.  Lymphadenopathy:     Cervical: No cervical adenopathy.  Skin:    General: Skin is warm and dry.  Neurological:     Mental Status: She is alert and oriented to person, place, and time. Mental status is at baseline.  Psychiatric:        Mood and Affect: Mood normal.        Behavior: Behavior normal.           Assessment & Plan:

## 2021-12-03 NOTE — Patient Instructions (Signed)
Schedule your complete physical in February We'll notify you of your lab results and make any changes if needed Keep up the good work on healthy diet and regular exercise- you look great!!! Call and schedule an eye exam at your convenience They'll call you to schedule the GI appt for colonoscopy Call with any questions or concerns Stay Safe!  Stay Healthy! Happy Fall!!!!

## 2021-12-04 ENCOUNTER — Telehealth: Payer: Self-pay

## 2021-12-04 DIAGNOSIS — N186 End stage renal disease: Secondary | ICD-10-CM | POA: Diagnosis not present

## 2021-12-04 DIAGNOSIS — L299 Pruritus, unspecified: Secondary | ICD-10-CM | POA: Diagnosis not present

## 2021-12-04 DIAGNOSIS — N2581 Secondary hyperparathyroidism of renal origin: Secondary | ICD-10-CM | POA: Diagnosis not present

## 2021-12-04 DIAGNOSIS — L298 Other pruritus: Secondary | ICD-10-CM | POA: Diagnosis not present

## 2021-12-04 DIAGNOSIS — Z992 Dependence on renal dialysis: Secondary | ICD-10-CM | POA: Diagnosis not present

## 2021-12-04 DIAGNOSIS — D689 Coagulation defect, unspecified: Secondary | ICD-10-CM | POA: Diagnosis not present

## 2021-12-04 NOTE — Telephone Encounter (Signed)
-----   Message from Midge Minium, MD sent at 12/04/2021  7:19 AM EDT ----- Creatinine is consistent w/ dialysis.  No cause for concern.  Your A1C jumped from 6.1 --> 7.2%  Make sure to work on low carb/low sugar diet and regular walking  Your total cholesterol, triglycerides (fatty part of the blood), VLDL, and LDL (bad cholesterol) are all above goal.  This places you at greater risk for heart attack and stroke.  Please make sure you are taking your Atorvastatin daily (it appears Cardiology wanted you to schedule a follow up before giving you additional refills)  Remainder of labs are stable

## 2021-12-05 ENCOUNTER — Telehealth: Payer: Self-pay | Admitting: Family Medicine

## 2021-12-05 NOTE — Telephone Encounter (Signed)
Initial Comment Caller states she has critical labs results. Translation No Nurse Assessment Nurse: Zara Council, RN, Joelene Millin Date/Time (Eastern Time): 12/03/2021 5:18:12 PM Is there an on-call provider listed? ---Yes Please list name of person reporting value (Lab Employee) and a contact number. ---[place], [facility], [phone] Please document the following items: Lab name Lab value (read back to lab to verify) Reference range for lab value Date and time blood was drawn Collect time of birth for bilirubin results ---creatinine 9.17, GFR 4.19, collected 12/03/21 at 1142am Please collect the patient contact information from the lab. (name, phone number and address) ---[facility] [phone] [place]. Time Eilene Ghazi Time) Disposition Final User 12/03/2021 5:23:44 PM Called On-Call Provider Cabe, RN, Joelene Millin 12/03/2021 5:25:12 PM Lab Call Cabe, RN, Joelene Millin Reason: Critical lab values reported to on call provider as per client directives and External RN [facility] 12/03/2021 5:25:18 PM Clinical Call Yes Zara Council, RN, Joelene Millin Final Disposition 12/03/2021 5:25:18 PM Clinical Call Yes Cabe, RN, Joelene Millin PLEASE NOTE: All timestamps contained within this report are represented as [place] Standard Time. CONFIDENTIALTY NOTICE: This fax transmission is intended only for the addressee. It contains information that is legally privileged, confidential or otherwise protected from use or disclosure. If you are not the intended recipient, you are strictly prohibited from reviewing, disclosing, copying using or disseminating any of this information or taking any action in reliance on or regarding this information. If you have received this fax in error, please notify us immediately by telephone so that we can arrange for its return to [place]. Phone: [phone], Toll-Free: [phone], Fax: [phone] Page: 2 of 2 Call Id: [id number] Paging DoctorName Phone DateTime Result/ Outcome  Message Type Notes Deborra Medina - MD [phone] 12/03/2021 5:23:44 PM Called On Call Provider - Reached Doctor Paged Deborra Medina - MD 12/03/2021 5:24:12 PM Spoke with On Call - General Message Result Critical lab values reported to Dr

## 2021-12-06 DIAGNOSIS — Z23 Encounter for immunization: Secondary | ICD-10-CM

## 2021-12-06 DIAGNOSIS — E785 Hyperlipidemia, unspecified: Secondary | ICD-10-CM | POA: Diagnosis not present

## 2021-12-06 DIAGNOSIS — L298 Other pruritus: Secondary | ICD-10-CM | POA: Diagnosis not present

## 2021-12-06 DIAGNOSIS — Z992 Dependence on renal dialysis: Secondary | ICD-10-CM | POA: Diagnosis not present

## 2021-12-06 DIAGNOSIS — L299 Pruritus, unspecified: Secondary | ICD-10-CM | POA: Diagnosis not present

## 2021-12-06 DIAGNOSIS — N2581 Secondary hyperparathyroidism of renal origin: Secondary | ICD-10-CM | POA: Diagnosis not present

## 2021-12-06 DIAGNOSIS — E1169 Type 2 diabetes mellitus with other specified complication: Secondary | ICD-10-CM | POA: Diagnosis not present

## 2021-12-06 DIAGNOSIS — I10 Essential (primary) hypertension: Secondary | ICD-10-CM | POA: Diagnosis not present

## 2021-12-06 DIAGNOSIS — N186 End stage renal disease: Secondary | ICD-10-CM | POA: Diagnosis not present

## 2021-12-06 DIAGNOSIS — Z1211 Encounter for screening for malignant neoplasm of colon: Secondary | ICD-10-CM | POA: Diagnosis not present

## 2021-12-06 DIAGNOSIS — D689 Coagulation defect, unspecified: Secondary | ICD-10-CM | POA: Diagnosis not present

## 2021-12-06 DIAGNOSIS — E1122 Type 2 diabetes mellitus with diabetic chronic kidney disease: Secondary | ICD-10-CM | POA: Diagnosis not present

## 2021-12-06 NOTE — Assessment & Plan Note (Signed)
Chronic problem.  Currently diet controlled.  Pt due for eye exam.  Foot exam done today.  No need for microalbumin due to HD.  Other than known neuropathy in her feet, pt is asymptomatic.  Check labs and determine if medication is needed.

## 2021-12-06 NOTE — Assessment & Plan Note (Signed)
Chronic problem.  On Lipitor 40mg daily w/o difficulty.  Check labs.  Adjust meds prn  ?

## 2021-12-06 NOTE — Assessment & Plan Note (Signed)
Chronic problem.  On Amlodipine '10mg'$  daily, Hydralazine '100mg'$  BID, Losartan '50mg'$  daily w/ adequate but not ideal control.  Since she has a hx of low BP at dialysis, will not increase or adjust medications at this time.  She is currently asymptomatic so will continue to follow.

## 2021-12-09 DIAGNOSIS — D689 Coagulation defect, unspecified: Secondary | ICD-10-CM | POA: Diagnosis not present

## 2021-12-09 DIAGNOSIS — N186 End stage renal disease: Secondary | ICD-10-CM | POA: Diagnosis not present

## 2021-12-09 DIAGNOSIS — Z992 Dependence on renal dialysis: Secondary | ICD-10-CM | POA: Diagnosis not present

## 2021-12-09 DIAGNOSIS — L299 Pruritus, unspecified: Secondary | ICD-10-CM | POA: Diagnosis not present

## 2021-12-09 DIAGNOSIS — N2581 Secondary hyperparathyroidism of renal origin: Secondary | ICD-10-CM | POA: Diagnosis not present

## 2021-12-09 DIAGNOSIS — E1129 Type 2 diabetes mellitus with other diabetic kidney complication: Secondary | ICD-10-CM | POA: Diagnosis not present

## 2021-12-11 DIAGNOSIS — N186 End stage renal disease: Secondary | ICD-10-CM | POA: Diagnosis not present

## 2021-12-11 DIAGNOSIS — Z992 Dependence on renal dialysis: Secondary | ICD-10-CM | POA: Diagnosis not present

## 2021-12-11 DIAGNOSIS — N2581 Secondary hyperparathyroidism of renal origin: Secondary | ICD-10-CM | POA: Diagnosis not present

## 2021-12-11 DIAGNOSIS — D689 Coagulation defect, unspecified: Secondary | ICD-10-CM | POA: Diagnosis not present

## 2021-12-11 DIAGNOSIS — L299 Pruritus, unspecified: Secondary | ICD-10-CM | POA: Diagnosis not present

## 2021-12-11 DIAGNOSIS — E1129 Type 2 diabetes mellitus with other diabetic kidney complication: Secondary | ICD-10-CM | POA: Diagnosis not present

## 2021-12-13 DIAGNOSIS — N2581 Secondary hyperparathyroidism of renal origin: Secondary | ICD-10-CM | POA: Diagnosis not present

## 2021-12-13 DIAGNOSIS — D689 Coagulation defect, unspecified: Secondary | ICD-10-CM | POA: Diagnosis not present

## 2021-12-13 DIAGNOSIS — Z992 Dependence on renal dialysis: Secondary | ICD-10-CM | POA: Diagnosis not present

## 2021-12-13 DIAGNOSIS — L299 Pruritus, unspecified: Secondary | ICD-10-CM | POA: Diagnosis not present

## 2021-12-13 DIAGNOSIS — N186 End stage renal disease: Secondary | ICD-10-CM | POA: Diagnosis not present

## 2021-12-13 DIAGNOSIS — E1129 Type 2 diabetes mellitus with other diabetic kidney complication: Secondary | ICD-10-CM | POA: Diagnosis not present

## 2021-12-16 DIAGNOSIS — N186 End stage renal disease: Secondary | ICD-10-CM | POA: Diagnosis not present

## 2021-12-16 DIAGNOSIS — N2581 Secondary hyperparathyroidism of renal origin: Secondary | ICD-10-CM | POA: Diagnosis not present

## 2021-12-16 DIAGNOSIS — D631 Anemia in chronic kidney disease: Secondary | ICD-10-CM | POA: Diagnosis not present

## 2021-12-16 DIAGNOSIS — L299 Pruritus, unspecified: Secondary | ICD-10-CM | POA: Diagnosis not present

## 2021-12-16 DIAGNOSIS — Z0000 Encounter for general adult medical examination without abnormal findings: Secondary | ICD-10-CM | POA: Insufficient documentation

## 2021-12-16 DIAGNOSIS — Z992 Dependence on renal dialysis: Secondary | ICD-10-CM | POA: Diagnosis not present

## 2021-12-16 DIAGNOSIS — D689 Coagulation defect, unspecified: Secondary | ICD-10-CM | POA: Diagnosis not present

## 2021-12-16 NOTE — Assessment & Plan Note (Signed)
Chronic problem.  Check labs.  Adjust meds prn  

## 2021-12-16 NOTE — Assessment & Plan Note (Signed)
Ongoing issue for pt.  Given her chronic SOB will refer back to pulmonary.  Pt expressed understanding and is in agreement w/ plan.

## 2021-12-16 NOTE — Assessment & Plan Note (Signed)
Chronic problem.  Overdue for eye exam.  Referral made.  Check labs.  Adjust meds prn

## 2021-12-16 NOTE — Assessment & Plan Note (Signed)
Pt's PE unchanged from previous w/ exception of new heart murmur.  Will get ECHO to assess.  Ordered mammo.  Referred to GI for colonocopy, pulmonary for SOB, and ophthalmology for eye exam.  Check labs.  Anticipatory guidance provided.

## 2021-12-16 NOTE — Assessment & Plan Note (Signed)
Ongoing issue for pt.  Tolerating at this time w/o difficulty.

## 2021-12-18 DIAGNOSIS — N186 End stage renal disease: Secondary | ICD-10-CM | POA: Diagnosis not present

## 2021-12-18 DIAGNOSIS — Z992 Dependence on renal dialysis: Secondary | ICD-10-CM | POA: Diagnosis not present

## 2021-12-18 DIAGNOSIS — N2581 Secondary hyperparathyroidism of renal origin: Secondary | ICD-10-CM | POA: Diagnosis not present

## 2021-12-18 DIAGNOSIS — L299 Pruritus, unspecified: Secondary | ICD-10-CM | POA: Diagnosis not present

## 2021-12-18 DIAGNOSIS — D631 Anemia in chronic kidney disease: Secondary | ICD-10-CM | POA: Diagnosis not present

## 2021-12-18 DIAGNOSIS — D689 Coagulation defect, unspecified: Secondary | ICD-10-CM | POA: Diagnosis not present

## 2021-12-20 DIAGNOSIS — Z992 Dependence on renal dialysis: Secondary | ICD-10-CM | POA: Diagnosis not present

## 2021-12-20 DIAGNOSIS — N186 End stage renal disease: Secondary | ICD-10-CM | POA: Diagnosis not present

## 2021-12-20 DIAGNOSIS — D631 Anemia in chronic kidney disease: Secondary | ICD-10-CM | POA: Diagnosis not present

## 2021-12-20 DIAGNOSIS — L299 Pruritus, unspecified: Secondary | ICD-10-CM | POA: Diagnosis not present

## 2021-12-20 DIAGNOSIS — N2581 Secondary hyperparathyroidism of renal origin: Secondary | ICD-10-CM | POA: Diagnosis not present

## 2021-12-20 DIAGNOSIS — D689 Coagulation defect, unspecified: Secondary | ICD-10-CM | POA: Diagnosis not present

## 2021-12-21 DIAGNOSIS — Z992 Dependence on renal dialysis: Secondary | ICD-10-CM | POA: Diagnosis not present

## 2021-12-21 DIAGNOSIS — I150 Renovascular hypertension: Secondary | ICD-10-CM | POA: Diagnosis not present

## 2021-12-21 DIAGNOSIS — N186 End stage renal disease: Secondary | ICD-10-CM | POA: Diagnosis not present

## 2021-12-22 DIAGNOSIS — R0602 Shortness of breath: Secondary | ICD-10-CM | POA: Diagnosis not present

## 2021-12-23 DIAGNOSIS — D689 Coagulation defect, unspecified: Secondary | ICD-10-CM | POA: Diagnosis not present

## 2021-12-23 DIAGNOSIS — N2581 Secondary hyperparathyroidism of renal origin: Secondary | ICD-10-CM | POA: Diagnosis not present

## 2021-12-23 DIAGNOSIS — N186 End stage renal disease: Secondary | ICD-10-CM | POA: Diagnosis not present

## 2021-12-23 DIAGNOSIS — Z992 Dependence on renal dialysis: Secondary | ICD-10-CM | POA: Diagnosis not present

## 2021-12-25 DIAGNOSIS — N186 End stage renal disease: Secondary | ICD-10-CM | POA: Diagnosis not present

## 2021-12-25 DIAGNOSIS — N2581 Secondary hyperparathyroidism of renal origin: Secondary | ICD-10-CM | POA: Diagnosis not present

## 2021-12-25 DIAGNOSIS — D689 Coagulation defect, unspecified: Secondary | ICD-10-CM | POA: Diagnosis not present

## 2021-12-25 DIAGNOSIS — Z992 Dependence on renal dialysis: Secondary | ICD-10-CM | POA: Diagnosis not present

## 2021-12-27 DIAGNOSIS — Z992 Dependence on renal dialysis: Secondary | ICD-10-CM | POA: Diagnosis not present

## 2021-12-27 DIAGNOSIS — N2581 Secondary hyperparathyroidism of renal origin: Secondary | ICD-10-CM | POA: Diagnosis not present

## 2021-12-27 DIAGNOSIS — N186 End stage renal disease: Secondary | ICD-10-CM | POA: Diagnosis not present

## 2021-12-27 DIAGNOSIS — D689 Coagulation defect, unspecified: Secondary | ICD-10-CM | POA: Diagnosis not present

## 2021-12-30 DIAGNOSIS — N2581 Secondary hyperparathyroidism of renal origin: Secondary | ICD-10-CM | POA: Diagnosis not present

## 2021-12-30 DIAGNOSIS — D689 Coagulation defect, unspecified: Secondary | ICD-10-CM | POA: Diagnosis not present

## 2021-12-30 DIAGNOSIS — L299 Pruritus, unspecified: Secondary | ICD-10-CM | POA: Diagnosis not present

## 2021-12-30 DIAGNOSIS — Z992 Dependence on renal dialysis: Secondary | ICD-10-CM | POA: Diagnosis not present

## 2021-12-30 DIAGNOSIS — N186 End stage renal disease: Secondary | ICD-10-CM | POA: Diagnosis not present

## 2021-12-31 ENCOUNTER — Other Ambulatory Visit: Payer: Self-pay | Admitting: Family Medicine

## 2021-12-31 ENCOUNTER — Other Ambulatory Visit: Payer: Self-pay | Admitting: *Deleted

## 2021-12-31 DIAGNOSIS — M79603 Pain in arm, unspecified: Secondary | ICD-10-CM

## 2022-01-01 DIAGNOSIS — D689 Coagulation defect, unspecified: Secondary | ICD-10-CM | POA: Diagnosis not present

## 2022-01-01 DIAGNOSIS — N2581 Secondary hyperparathyroidism of renal origin: Secondary | ICD-10-CM | POA: Diagnosis not present

## 2022-01-01 DIAGNOSIS — N186 End stage renal disease: Secondary | ICD-10-CM | POA: Diagnosis not present

## 2022-01-01 DIAGNOSIS — L299 Pruritus, unspecified: Secondary | ICD-10-CM | POA: Diagnosis not present

## 2022-01-01 DIAGNOSIS — Z992 Dependence on renal dialysis: Secondary | ICD-10-CM | POA: Diagnosis not present

## 2022-01-02 ENCOUNTER — Telehealth: Payer: Self-pay

## 2022-01-02 NOTE — Telephone Encounter (Signed)
Called Patient x3 with no answer, no vm. Patient may reschedule for the next available appointment.  L.[place],LPN

## 2022-01-03 DIAGNOSIS — Z992 Dependence on renal dialysis: Secondary | ICD-10-CM | POA: Diagnosis not present

## 2022-01-03 DIAGNOSIS — L299 Pruritus, unspecified: Secondary | ICD-10-CM | POA: Diagnosis not present

## 2022-01-03 DIAGNOSIS — N2581 Secondary hyperparathyroidism of renal origin: Secondary | ICD-10-CM | POA: Diagnosis not present

## 2022-01-03 DIAGNOSIS — D689 Coagulation defect, unspecified: Secondary | ICD-10-CM | POA: Diagnosis not present

## 2022-01-03 DIAGNOSIS — N186 End stage renal disease: Secondary | ICD-10-CM | POA: Diagnosis not present

## 2022-01-03 NOTE — Telephone Encounter (Signed)
error 

## 2022-01-06 DIAGNOSIS — N2581 Secondary hyperparathyroidism of renal origin: Secondary | ICD-10-CM | POA: Diagnosis not present

## 2022-01-06 DIAGNOSIS — L299 Pruritus, unspecified: Secondary | ICD-10-CM | POA: Diagnosis not present

## 2022-01-06 DIAGNOSIS — E1129 Type 2 diabetes mellitus with other diabetic kidney complication: Secondary | ICD-10-CM | POA: Diagnosis not present

## 2022-01-06 DIAGNOSIS — D689 Coagulation defect, unspecified: Secondary | ICD-10-CM | POA: Diagnosis not present

## 2022-01-06 DIAGNOSIS — Z992 Dependence on renal dialysis: Secondary | ICD-10-CM | POA: Diagnosis not present

## 2022-01-06 DIAGNOSIS — Z23 Encounter for immunization: Secondary | ICD-10-CM | POA: Diagnosis not present

## 2022-01-06 DIAGNOSIS — N186 End stage renal disease: Secondary | ICD-10-CM | POA: Diagnosis not present

## 2022-01-07 ENCOUNTER — Ambulatory Visit (INDEPENDENT_AMBULATORY_CARE_PROVIDER_SITE_OTHER): Payer: Medicare HMO | Admitting: Vascular Surgery

## 2022-01-07 ENCOUNTER — Ambulatory Visit (HOSPITAL_COMMUNITY)
Admission: RE | Admit: 2022-01-07 | Discharge: 2022-01-07 | Disposition: A | Discharge status: Home / Self Care | Payer: Medicare HMO | Source: Ambulatory Visit | Attending: Vascular Surgery | Admitting: Vascular Surgery

## 2022-01-07 VITALS — BP 160/87 | HR 80 | Temp 97.7°F | Resp 14 | Ht 66.0 in | Wt 184.0 lb

## 2022-01-07 DIAGNOSIS — T82898A Other specified complication of vascular prosthetic devices, implants and grafts, initial encounter: Secondary | ICD-10-CM

## 2022-01-07 DIAGNOSIS — N186 End stage renal disease: Secondary | ICD-10-CM | POA: Diagnosis not present

## 2022-01-07 DIAGNOSIS — M79603 Pain in arm, unspecified: Secondary | ICD-10-CM | POA: Insufficient documentation

## 2022-01-07 DIAGNOSIS — Z992 Dependence on renal dialysis: Secondary | ICD-10-CM | POA: Diagnosis not present

## 2022-01-07 NOTE — Progress Notes (Signed)
Patient name: [name] [name] MRN: [id number] DOB: 06-07-1955 Sex: female  REASON FOR CONSULT: Evaluate for left hand steal syndrome  HPI: [name] [name] is a 66 y.o. female, with multiple medical problems including end-stage renal disease using a left arm brachiobasilic AVF that presents for evaluation of left hand steal syndrome.  She has numbness in all 5 digits of the left hand.  This started about 3 to 4 months ago.  She states it is intermittent with no exacerbating factors.  Does not necessarily happen during dialysis or worsen during dialysis.  No tissue loss.  No weakness in the hand.  She does feel this is tolerable for now.  Has had a previously failed brachiocephalic fistula in the left arm.  Past Medical History:  Diagnosis Date   Adenomatous colon polyp 04/1998   Anemia    Anxiety    Arthritis    HIp   Carotid artery disease ([place])    Carotid US 1/18: bilateral ICA 1-39, R thyroid lobe nodule (1.9x2.2x3cm); numerous L thyroid lobe nodules - repeat 1 year   Depression    Diabetes mellitus    diet controlled   EBV infection    Per [facility]   Encephalitis    NMDA per [facility]   End-stage renal disease (ESRD) ([place])    hx of renal transplant failed. Early rd [facility].    Esophagitis    Grade 1 Distal   GERD (gastroesophageal reflux disease)    Hemorrhoids    History of blood transfusion    History of kidney stones    Passed   History of subdural hematoma    Hyperkalemia    Hyperlipidemia    Hypertension    Hypomagnesemia    Left jugular vein thrombosis    Partial resolved per [facility]   Metabolic acidosis    On home oxygen therapy    2.5 liters   Pneumonia    Right jugular vein thrombosis    resolved from [place]   Seizures [facility])     Past Surgical History:  Procedure Laterality Date   A/V FISTULAGRAM Left 06/04/2017   Procedure: A/V FISTULAGRAM;  Surgeon: Conrad Hanover, MD;  Location: [place] [facility];  Service: Cardiovascular;  Laterality: Left;   A/V FISTULAGRAM  N/A 03/24/2019   Procedure: A/V FISTULAGRAM - left arm;  Surgeon: [name], MD;  Location: [place] [facility];  Service: Cardiovascular;  Laterality: N/A;   A/V FISTULAGRAM N/A 03/08/2020   Procedure: A/V FISTULAGRAM - Left arm;  Surgeon: [name], MD;  Location: [place] [facility];  Service: Cardiovascular;  Laterality: N/A;   ABDOMINAL HYSTERECTOMY     AV FISTULA PLACEMENT  07/04/2005   [place] AV fistula   AV FISTULA PLACEMENT  08/27/2005   AV FISTULA PLACEMENT Left 04/29/2017   Procedure: Creation of Left Arm ARTERIOVENOUS BRACHIOCEPHALIC FISTULA;  Surgeon: Elam Dutch, MD;  Location: [place];  Service: Vascular;  Laterality: Left;   AV FISTULA PLACEMENT Left 06/16/2017   Procedure: BRACHIO_BASCILIC ARTERIOVENOUS (AV) FISTULA CREATION;  Surgeon: Waynetta Sandy, MD;  Location: [place];  Service: Vascular;  Laterality: Left;   AV FISTULA PLACEMENT W/ PTFE  57/03/7791   BASCILIC VEIN TRANSPOSITION Left 06/16/2017   Procedure: BRACHIOCEPHALIC TRANSPOSITION  LEFT ARM;  Surgeon: Waynetta Sandy, MD;  Location: [place];  Service: Vascular;  Laterality: Left;   [place] Left 09/01/2017   Procedure: BASILIC VEIN TRANSPOSITION SECOND STAGE;  Surgeon: Waynetta Sandy, MD;  [facility];  Service: Vascular;  Laterality: Left;   BRAIN BIOPSY     CESAREAN SECTION     COLONOSCOPY W/ POLYPECTOMY     DG AV DIALYSIS GRAFT DECLOT OR  07/24/2005   AV Gore-Tex graf   DG AV DIALYSIS GRAFT DECLOT OR  Thrombosis right forearm, loop arteriovenous   Thrombosis right forearm, loop arteriovenous graft   FISTULA SUPERFICIALIZATION Left 1/60/1093   Procedure: PLICATIONS OF ANEURYSM;  Surgeon: [name], MD;  Location: [place];  Service: Vascular;  Laterality: Left;   GASTROSTOMY TUBE PLACEMENT     INSERTION OF DIALYSIS CATHETER N/A 04/04/2020   Procedure: TUNNELED DIALYSIS CATHETER PLACEMENT;  Surgeon: [name], MD;   Location: [place] [facility];  Service: Vascular;  Laterality: N/A;   KIDNEY TRANSPLANT  2009   Both   PERIPHERAL VASCULAR BALLOON ANGIOPLASTY Left 03/24/2019   Procedure: PERIPHERAL VASCULAR BALLOON ANGIOPLASTY;  Surgeon: [name], MD;  Location: [place] [facility];  Service: Cardiovascular;  Laterality: Left;  left AV fistula   PERIPHERAL VASCULAR BALLOON ANGIOPLASTY  03/08/2020   Procedure: PERIPHERAL VASCULAR BALLOON ANGIOPLASTY;  Surgeon: [name], MD;  Location: [place] [facility];  Service: Cardiovascular;;   REMOVAL OF GASTROSTOMY TUBE     REVISON OF ARTERIOVENOUS FISTULA Left 11/23/2018   Procedure: REVISION PLICATION OF ARTERIOVENOUS FISTULA;  Surgeon: Waynetta Sandy, MD;  Location: [place];  Service: Vascular;  Laterality: Left;   REVISON OF ARTERIOVENOUS FISTULA Left 04/04/2020   Procedure: LEFT ARM ARTERIOVENOUS FISTULA REVISON;  Surgeon: [name], MD;  [facility];  Service: Vascular;  Laterality: Left;   THROMBECTOMY / ARTERIOVENOUS GRAFT REVISION  10/12/2006   THROMBECTOMY / ARTERIOVENOUS GRAFT REVISION  10/16/2006    Family History  Problem Relation Age of Onset   Kidney disease Paternal Aunt    Heart disease Mother    Heart disease Father    Colon cancer Neg Hx     SOCIAL HISTORY: Social History   Socioeconomic History   Marital status: Widowed    Spouse name: Not on file   Number of children: 2   Years of education: 67   Highest education level: Not on file  Occupational History   Occupation: Disabled  Tobacco Use   Smoking status: Former   Smokeless tobacco: Never   Tobacco comments:    smoked in teens  Vaping Use   Vaping Use: Never used  Substance and Sexual Activity   Alcohol use: Not Currently   Drug use: No   Sexual activity: Never  Other Topics Concern   Not on file  Social History Narrative   Denies caffeine use    Social Determinants of Health   Financial Resource Strain: High Risk (06/13/2021)   Overall  Financial Resource Strain (CARDIA)    Difficulty of Paying Living Expenses: Very hard  Food Insecurity: Food Insecurity Present (06/13/2021)   Hunger Vital Sign    Worried About Running Out of Food in the Last Year: Often true    Ran Out of Food in the Last Year: Often true  Transportation Needs: No Transportation Needs (06/12/2021)   PRAPARE - Hydrologist (Medical): No    Lack of Transportation (Non-Medical): No  Physical Activity: Inactive (09/27/2019)   Exercise Vital Sign    Days of Exercise per Week: 0 days    Minutes of Exercise per Session: 0 min  Stress: No Stress Concern Present (06/12/2021)   [employer] of  Occupational Health - Occupational Stress Questionnaire    Feeling of Stress : Not at all  Social Connections: Moderately Isolated (09/27/2019)   Social Connection and Isolation Panel [NHANES]    Frequency of Communication with Friends and Family: Once a week    Frequency of Social Gatherings with Friends and Family: Once a week    Attends Religious Services: 1 to 4 times per year    Active Member of [employer] or Organizations: Yes    Attends Archivist Meetings: 1 to 4 times per year    Marital Status: Widowed  Intimate Partner Violence: Not At Risk (09/27/2019)   Humiliation, Afraid, Rape, and Kick questionnaire    Fear of Current or Ex-Partner: No    Emotionally Abused: No    Physically Abused: No    Sexually Abused: No    Allergies  Allergen Reactions   Lisinopril Shortness Of Breath, Swelling and Other (See Comments)    Throat irritation also. Patient takes losartan and tolerates fine.   Lyrica [Pregabalin] Other (See Comments)    Causing severe myoclonic jerking of UE's in march 2022, dc'd   Daypro [Oxaprozin] Hives and Dermatitis   Pregabalin Er     Other reaction(s): Jerks    Current Outpatient Medications  Medication Sig Dispense Refill   acetaminophen (TYLENOL) 650 MG CR tablet Take 650-1,300 mg by mouth every 8  (eight) hours as needed for pain.      albuterol (VENTOLIN HFA) 108 (90 Base) MCG/ACT inhaler INHALE 2 PUFFS INTO THE LUNGS EVERY 4 (FOUR) HOURS AS NEEDED FOR WHEEZING OR SHORTNESS OF BREATH. 18 g 3   amLODipine (NORVASC) 10 MG tablet TAKE 1 TABLET BY MOUTH EVERY DAY 90 tablet 0   atorvastatin (LIPITOR) 40 MG tablet Take 1 tablet (40 mg total) by mouth daily. Please make yearly appt with Dr. Acie Fredrickson for April 2022 for future refills. Thank you 1st attempt 90 tablet 0   blood glucose meter kit and supplies KIT Dispense based on patient and insurance preference. Patient should test sugars twice daily as directed. Dx. E11.9 1 each 0   DULoxetine (CYMBALTA) 20 MG capsule TAKE 1 CAPSULE BY MOUTH EVERY DAY 90 capsule 0   hydrALAZINE (APRESOLINE) 100 MG tablet TAKE 1 TABLET BY MOUTH TWICE A DAY 180 tablet 0   HYDROcodone-acetaminophen (NORCO/VICODIN) 5-325 MG tablet Take 1 tablet by mouth every 6 (six) hours as needed for moderate pain. 30 tablet 0   isosorbide dinitrate (ISORDIL) 20 MG tablet TAKE 1 TABLET BY MOUTH THREE TIMES A DAY 270 tablet 0   losartan (COZAAR) 50 MG tablet TAKE 1 TABLET BY MOUTH EVERY DAY 90 tablet 1   multivitamin (RENA-VIT) TABS tablet Take 1 tablet by mouth every morning.   11   omeprazole (PRILOSEC) 40 MG capsule TAKE 1 CAPSULE BY MOUTH EVERY DAY 90 capsule 1   ONETOUCH ULTRA test strip USE AS INSTRUCTED TO TEST SUGARS TWICE DAILY. DX. E11.9 100 strip 12   pregabalin (LYRICA) 100 MG capsule Take 100 mg by mouth 3 (three) times daily.     QUEtiapine (SEROQUEL) 100 MG tablet TAKE 1 TABLET BY MOUTH EVERYDAY AT BEDTIME 90 tablet 1   traZODone (DESYREL) 50 MG tablet Take 50 mg by mouth at bedtime as needed for sleep.      No current facility-administered medications for this visit.    REVIEW OF SYSTEMS:  '[X]'  denotes positive finding, '[ ]'  denotes negative finding Cardiac  Comments:  Chest pain or chest pressure:  Shortness of breath upon exertion:    Short of breath when lying  flat:    Irregular heart rhythm:        Vascular    Pain in calf, thigh, or hip brought on by ambulation:    Pain in feet at night that wakes you up from your sleep:     Blood clot in your veins:    Leg swelling:         Pulmonary    Oxygen at home:    Productive cough:     Wheezing:         Neurologic    Sudden weakness in arms or legs:     Sudden numbness in arms or legs:     Sudden onset of difficulty speaking or slurred speech:    Temporary loss of vision in one eye:     Problems with dizziness:         Gastrointestinal    Blood in stool:     Vomited blood:         Genitourinary    Burning when urinating:     Blood in urine:        Psychiatric    Major depression:         Hematologic    Bleeding problems:    Problems with blood clotting too easily:        Skin    Rashes or ulcers:        Constitutional    Fever or chills:      PHYSICAL EXAM: Vitals:   01/07/22 1540  BP: (!) 160/87  Pulse: 80  Resp: 14  Temp: 97.7 F (36.5 C)  TempSrc: Temporal  SpO2: 95%  Weight: 184 lb (83.5 kg)  Height: '5\' 6"'  (1.676 m)    GENERAL: The patient is a well-nourished female, in no acute distress. The vital signs are documented above. CARDIAC: There is a regular rate and rhythm.  VASCULAR:  Left radial pulse palpable Left ulnar pulse non-palpable, signal more monophasic but brisk Good palmar arch signal No left hand tissue loss Basilic vein fistula with excellent thrill PULMONARY: No respiratory distress ABDOMEN: Soft and non-tender with normal pitched bowel sounds.  MUSCULOSKELETAL: There are no major deformities or cyanosis. NEUROLOGIC: No focal weakness or paresthesias are detected. SKIN: There are no ulcers or rashes noted. PSYCHIATRIC: The patient has a normal affect.  DATA:   History: 04/04/20 Left arm AVF revision by Dr Carlis Abbott.           03/08/20 AVF revison           03/24/19 AVF revision           11/23/18 AVF revision           06/16/17 AV fistula  placement           06/04/17 AV fistulagram           04/29/17 AV fistula placement           08/27/05 AV fistula placement           07/04/05 AV fistula placement.   Comparison Study: 06/13/20 L UE AVF Duplex   Performing Technologist: Elta Guadeloupe RVT, RDMS      Examination Guidelines: A complete evaluation includes B-mode imaging,  spectral  Doppler, color Doppler, and power Doppler as needed of all accessible  portions  of each vessel. Bilateral testing is considered an integral part of a  complete  examination. Limited examinations for reoccurring indications  may be  performed  as noted.      Findings:      +---------------------------+--------+--------+---------------+                             Right   Left    Comments         +---------------------------+--------+--------+---------------+  Brachial                   165 mmHg                         +---------------------------+--------+--------+---------------+  Radial Ambient                                              +---------------------------+--------+--------+---------------+  Radial AV Compression                                       +---------------------------+--------+--------+---------------+  Ulnar Ambient                                               +---------------------------+--------+--------+---------------+  Ulnar AV Compression                                        +---------------------------+--------+--------+---------------+  2nd Digit Ambient          172 mmHg142 mmHg                 +---------------------------+--------+--------+---------------+  2nd Digit Ulnar Compression        169 mmHgAVF compression  +---------------------------+--------+--------+---------------+   Assessment/Plan:  66 year old female with end-stage renal disease that presents for evaluation of left hand numbness in all 5 digits over the last 3 to 4 months.  I discussed with her  that I suspect there is some component of steal syndrome based on her description.  Her second digit ambient pressure is actually pretty good at 142 mmHg at baseline with the fistula patent and the digit pressures augments to 169 mm Hg with compression of the fistula.  She does have a palpable radial pulse at the wrist with her fistula patent.  I could not palpate an ulnar pulse but she does have pretty brisk signal.  I discussed that if this is really bothersome the next step would be a left upper extremity arteriogram to evaluate for any inflow disease and runoff into the hand.  Ultimately she is not interested in any invasive procedure at this time.  She feels that her symptoms are tolerable.  She would prefer to continue monitoring.  She will let me know if symptoms worsen and I will be happy to see her back or schedule arteriogram.   [name], MD [facility]: [phone]

## 2022-01-08 DIAGNOSIS — N186 End stage renal disease: Secondary | ICD-10-CM | POA: Diagnosis not present

## 2022-01-08 DIAGNOSIS — D689 Coagulation defect, unspecified: Secondary | ICD-10-CM | POA: Diagnosis not present

## 2022-01-08 DIAGNOSIS — L299 Pruritus, unspecified: Secondary | ICD-10-CM | POA: Diagnosis not present

## 2022-01-08 DIAGNOSIS — N2581 Secondary hyperparathyroidism of renal origin: Secondary | ICD-10-CM | POA: Diagnosis not present

## 2022-01-08 DIAGNOSIS — E1129 Type 2 diabetes mellitus with other diabetic kidney complication: Secondary | ICD-10-CM | POA: Diagnosis not present

## 2022-01-08 DIAGNOSIS — Z992 Dependence on renal dialysis: Secondary | ICD-10-CM | POA: Diagnosis not present

## 2022-01-08 DIAGNOSIS — Z23 Encounter for immunization: Secondary | ICD-10-CM | POA: Diagnosis not present

## 2022-01-10 DIAGNOSIS — Z992 Dependence on renal dialysis: Secondary | ICD-10-CM | POA: Diagnosis not present

## 2022-01-10 DIAGNOSIS — D689 Coagulation defect, unspecified: Secondary | ICD-10-CM | POA: Diagnosis not present

## 2022-01-10 DIAGNOSIS — Z23 Encounter for immunization: Secondary | ICD-10-CM | POA: Diagnosis not present

## 2022-01-10 DIAGNOSIS — N2581 Secondary hyperparathyroidism of renal origin: Secondary | ICD-10-CM | POA: Diagnosis not present

## 2022-01-10 DIAGNOSIS — N186 End stage renal disease: Secondary | ICD-10-CM | POA: Diagnosis not present

## 2022-01-10 DIAGNOSIS — E1129 Type 2 diabetes mellitus with other diabetic kidney complication: Secondary | ICD-10-CM | POA: Diagnosis not present

## 2022-01-10 DIAGNOSIS — L299 Pruritus, unspecified: Secondary | ICD-10-CM | POA: Diagnosis not present

## 2022-01-13 DIAGNOSIS — L299 Pruritus, unspecified: Secondary | ICD-10-CM | POA: Diagnosis not present

## 2022-01-13 DIAGNOSIS — N186 End stage renal disease: Secondary | ICD-10-CM | POA: Diagnosis not present

## 2022-01-13 DIAGNOSIS — D689 Coagulation defect, unspecified: Secondary | ICD-10-CM | POA: Diagnosis not present

## 2022-01-13 DIAGNOSIS — N2581 Secondary hyperparathyroidism of renal origin: Secondary | ICD-10-CM | POA: Diagnosis not present

## 2022-01-13 DIAGNOSIS — Z992 Dependence on renal dialysis: Secondary | ICD-10-CM | POA: Diagnosis not present

## 2022-01-13 NOTE — Telephone Encounter (Signed)
I spoke to patient's daughter, [name] [name], and she rescheduled appointment to 01/21/2022. Patient apologized for missing the last appointment.

## 2022-01-15 DIAGNOSIS — N186 End stage renal disease: Secondary | ICD-10-CM | POA: Diagnosis not present

## 2022-01-15 DIAGNOSIS — Z992 Dependence on renal dialysis: Secondary | ICD-10-CM | POA: Diagnosis not present

## 2022-01-15 DIAGNOSIS — D689 Coagulation defect, unspecified: Secondary | ICD-10-CM | POA: Diagnosis not present

## 2022-01-15 DIAGNOSIS — L299 Pruritus, unspecified: Secondary | ICD-10-CM | POA: Diagnosis not present

## 2022-01-15 DIAGNOSIS — N2581 Secondary hyperparathyroidism of renal origin: Secondary | ICD-10-CM | POA: Diagnosis not present

## 2022-01-17 DIAGNOSIS — D689 Coagulation defect, unspecified: Secondary | ICD-10-CM | POA: Diagnosis not present

## 2022-01-17 DIAGNOSIS — Z992 Dependence on renal dialysis: Secondary | ICD-10-CM | POA: Diagnosis not present

## 2022-01-17 DIAGNOSIS — N2581 Secondary hyperparathyroidism of renal origin: Secondary | ICD-10-CM | POA: Diagnosis not present

## 2022-01-17 DIAGNOSIS — L299 Pruritus, unspecified: Secondary | ICD-10-CM | POA: Diagnosis not present

## 2022-01-17 DIAGNOSIS — N186 End stage renal disease: Secondary | ICD-10-CM | POA: Diagnosis not present

## 2022-01-20 DIAGNOSIS — N186 End stage renal disease: Secondary | ICD-10-CM | POA: Diagnosis not present

## 2022-01-20 DIAGNOSIS — Z992 Dependence on renal dialysis: Secondary | ICD-10-CM | POA: Diagnosis not present

## 2022-01-20 DIAGNOSIS — N2581 Secondary hyperparathyroidism of renal origin: Secondary | ICD-10-CM | POA: Diagnosis not present

## 2022-01-20 DIAGNOSIS — D689 Coagulation defect, unspecified: Secondary | ICD-10-CM | POA: Diagnosis not present

## 2022-01-21 ENCOUNTER — Ambulatory Visit (INDEPENDENT_AMBULATORY_CARE_PROVIDER_SITE_OTHER): Payer: Medicare HMO

## 2022-01-21 VITALS — Ht 66.0 in | Wt 184.0 lb

## 2022-01-21 DIAGNOSIS — I150 Renovascular hypertension: Secondary | ICD-10-CM | POA: Diagnosis not present

## 2022-01-21 DIAGNOSIS — N186 End stage renal disease: Secondary | ICD-10-CM | POA: Diagnosis not present

## 2022-01-21 DIAGNOSIS — Z0000 Encounter for general adult medical examination without abnormal findings: Secondary | ICD-10-CM | POA: Diagnosis not present

## 2022-01-21 DIAGNOSIS — Z992 Dependence on renal dialysis: Secondary | ICD-10-CM | POA: Diagnosis not present

## 2022-01-21 NOTE — Patient Instructions (Signed)
[name] [name] , Thank you for taking time to come for your Medicare Wellness Visit. I appreciate your ongoing commitment to your health goals. Please review the following plan we discussed and let me know if I can assist you in the future.   These are the goals we discussed:  Goals      Patient Stated     Would like to start walking more        This is a list of the screening recommended for you and due dates:  Health Maintenance  Topic Date Due   Zoster (Shingles) Vaccine (1 of 2) Never done   Colon Cancer Screening  06/20/2019   Eye exam for diabetics  02/22/2020   COVID-19 Vaccine (4 - Pfizer risk series) 02/24/2020   Mammogram  05/09/2020   Hemoglobin A1C  06/03/2022   Complete foot exam   12/04/2022   Medicare Annual Wellness Visit  01/22/2023   Tetanus Vaccine  02/02/2029   Flu Shot  Completed   Hepatitis C Screening: USPSTF Recommendation to screen - Ages 18-79 yo.  Completed   HIV Screening  Completed   HPV Vaccine  Aged Out    Advanced directives: Please bring a copy of your health care power of attorney and living will to the office to be added to your chart at your convenience.   Conditions/risks identified: Aim for 30 minutes of exercise or brisk walking, 6-8 glasses of water, and 5 servings of fruits and vegetables each day.   Next appointment: Follow up in one year for your annual wellness visit.   Preventive Care 40-64 Years, Female Preventive care refers to lifestyle choices and visits with your health care provider that can promote health and wellness. What does preventive care include? A yearly physical exam. This is also called an annual well check. Dental exams once or twice a year. Routine eye exams. Ask your health care provider how often you should have your eyes checked. Personal lifestyle choices, including: Daily care of your teeth and gums. Regular physical activity. Eating a healthy diet. Avoiding tobacco and drug use. Limiting alcohol  use. Practicing safe sex. Taking low-dose aspirin daily starting at age 43. Taking vitamin and mineral supplements as recommended by your health care provider. What happens during an annual well check? The services and screenings done by your health care provider during your annual well check will depend on your age, overall health, lifestyle risk factors, and family history of disease. Counseling  Your health care provider may ask you questions about your: Alcohol use. Tobacco use. Drug use. Emotional well-being. Home and relationship well-being. Sexual activity. Eating habits. Work and work Statistician. Method of birth control. Menstrual cycle. Pregnancy history. Screening  You may have the following tests or measurements: Height, weight, and BMI. Blood pressure. Lipid and cholesterol levels. These may be checked every 5 years, or more frequently if you are over 37 years old. Skin check. Lung cancer screening. You may have this screening every year starting at age 100 if you have a 30-pack-year history of smoking and currently smoke or have quit within the past 15 years. Fecal occult blood test (FOBT) of the stool. You may have this test every year starting at age 77. Flexible sigmoidoscopy or colonoscopy. You may have a sigmoidoscopy every 5 years or a colonoscopy every 10 years starting at age 61. Hepatitis C blood test. Hepatitis B blood test. Sexually transmitted disease (STD) testing. Diabetes screening. This is done by checking your blood sugar (glucose)  after you have not eaten for a while (fasting). You may have this done every 1-3 years. Mammogram. This may be done every 1-2 years. Talk to your health care provider about when you should start having regular mammograms. This may depend on whether you have a family history of breast cancer. BRCA-related cancer screening. This may be done if you have a family history of breast, ovarian, tubal, or peritoneal cancers. Pelvic  exam and Pap test. This may be done every 3 years starting at age 89. Starting at age 49, this may be done every 5 years if you have a Pap test in combination with an HPV test. Bone density scan. This is done to screen for osteoporosis. You may have this scan if you are at high risk for osteoporosis. Discuss your test results, treatment options, and if necessary, the need for more tests with your health care provider. Vaccines  Your health care provider may recommend certain vaccines, such as: Influenza vaccine. This is recommended every year. Tetanus, diphtheria, and acellular pertussis (Tdap, Td) vaccine. You may need a Td booster every 10 years. Zoster vaccine. You may need this after age 51. Pneumococcal 13-valent conjugate (PCV13) vaccine. You may need this if you have certain conditions and were not previously vaccinated. Pneumococcal polysaccharide (PPSV23) vaccine. You may need one or two doses if you smoke cigarettes or if you have certain conditions. Talk to your health care provider about which screenings and vaccines you need and how often you need them. This information is not intended to replace advice given to you by your health care provider. Make sure you discuss any questions you have with your health care provider. Document Released: 04/06/2015 Document Revised: 11/28/2015 Document Reviewed: 01/09/2015 Elsevier Interactive Patient Education  2017 [place] Prevention in the Home Falls can cause injuries. They can happen to people of all ages. There are many things you can do to make your home safe and to help prevent falls. What can I do on the outside of my home? Regularly fix the edges of walkways and driveways and fix any cracks. Remove anything that might make you trip as you walk through a door, such as a raised step or threshold. Trim any bushes or trees on the path to your home. Use bright outdoor lighting. Clear any walking paths of anything that might  make someone trip, such as rocks or tools. Regularly check to see if handrails are loose or broken. Make sure that both sides of any steps have handrails. Any raised decks and porches should have guardrails on the edges. Have any leaves, snow, or ice cleared regularly. Use sand or salt on walking paths during winter. Clean up any spills in your garage right away. This includes oil or grease spills. What can I do in the bathroom? Use night lights. Install grab bars by the toilet and in the tub and shower. Do not use towel bars as grab bars. Use non-skid mats or decals in the tub or shower. If you need to sit down in the shower, use a plastic, non-slip stool. Keep the floor dry. Clean up any water that spills on the floor as soon as it happens. Remove soap buildup in the tub or shower regularly. Attach bath mats securely with double-sided non-slip rug tape. Do not have throw rugs and other things on the floor that can make you trip. What can I do in the bedroom? Use night lights. Make sure that you have a  light by your bed that is easy to reach. Do not use any sheets or blankets that are too big for your bed. They should not hang down onto the floor. Have a firm chair that has side arms. You can use this for support while you get dressed. Do not have throw rugs and other things on the floor that can make you trip. What can I do in the kitchen? Clean up any spills right away. Avoid walking on wet floors. Keep items that you use a lot in easy-to-reach places. If you need to reach something above you, use a strong step stool that has a grab bar. Keep electrical cords out of the way. Do not use floor polish or wax that makes floors slippery. If you must use wax, use non-skid floor wax. Do not have throw rugs and other things on the floor that can make you trip. What can I do with my stairs? Do not leave any items on the stairs. Make sure that there are handrails on both sides of the stairs  and use them. Fix handrails that are broken or loose. Make sure that handrails are as long as the stairways. Check any carpeting to make sure that it is firmly attached to the stairs. Fix any carpet that is loose or worn. Avoid having throw rugs at the top or bottom of the stairs. If you do have throw rugs, attach them to the floor with carpet tape. Make sure that you have a light switch at the top of the stairs and the bottom of the stairs. If you do not have them, ask someone to add them for you. What else can I do to help prevent falls? Wear shoes that: Do not have high heels. Have rubber bottoms. Are comfortable and fit you well. Are closed at the toe. Do not wear sandals. If you use a stepladder: Make sure that it is fully opened. Do not climb a closed stepladder. Make sure that both sides of the stepladder are locked into place. Ask someone to hold it for you, if possible. Clearly mark and make sure that you can see: Any grab bars or handrails. First and last steps. Where the edge of each step is. Use tools that help you move around (mobility aids) if they are needed. These include: Canes. Walkers. Scooters. Crutches. Turn on the lights when you go into a dark area. Replace any light bulbs as soon as they burn out. Set up your furniture so you have a clear path. Avoid moving your furniture around. If any of your floors are uneven, fix them. If there are any pets around you, be aware of where they are. Review your medicines with your doctor. Some medicines can make you feel dizzy. This can increase your chance of falling. Ask your doctor what other things that you can do to help prevent falls. This information is not intended to replace advice given to you by your health care provider. Make sure you discuss any questions you have with your health care provider. Document Released: 01/04/2009 Document Revised: 08/16/2015 Document Reviewed: 04/14/2014 Elsevier Interactive Patient  Education  2017 [employer].

## 2022-01-21 NOTE — Progress Notes (Signed)
Subjective:   [name] [name] is a 66 y.o. female who presents for Medicare Annual (Subsequent) preventive examination.  I connected with  Luellen Pucker on 01/21/22 by a audio enabled telemedicine application and verified that I am speaking with the correct person using two identifiers.  Patient Location: Home  Provider Location: Home Office  I discussed the limitations of evaluation and management by telemedicine. The patient expressed understanding and agreed to proceed.   Review of Systems     Cardiac Risk Factors include: advanced age (>58mn, >>57women);diabetes mellitus;hypertension;sedentary lifestyle     Objective:    Today's Vitals   01/21/22 0911  Weight: 184 lb (83.5 kg)  Height: _0  (1.676 m)   Body mass index is 29.7 kg/m.     01/21/2022    9:14 AM 06/12/2021    4:44 PM 07/14/2020    1:27 AM 06/11/2020   10:00 PM 04/04/2020    9:27 AM 03/08/2020    7:53 AM 09/27/2019    1:43 PM  Advanced Directives  Does Patient Have a Medical Advance Directive? No Yes No No No No Yes  Type of ASocial research officer, governmentLiving will     [place] will  Does patient want to make changes to medical advance directive?  No - Patient declined       Copy of [place]?  No - copy requested     No - copy requested  Would patient like information on creating a medical advance directive? No - Patient declined  No - Patient declined No - Patient declined No - Patient declined      Current Medications (verified) Outpatient Encounter Medications as of 01/21/2022  Medication Sig   acetaminophen (TYLENOL) 650 MG CR tablet Take 650-1,300 mg by mouth every 8 (eight) hours as needed for pain.    albuterol (VENTOLIN HFA) 108 (90 Base) MCG/ACT inhaler INHALE 2 PUFFS INTO THE LUNGS EVERY 4 (FOUR) HOURS AS NEEDED FOR WHEEZING OR SHORTNESS OF BREATH.   amLODipine (NORVASC) 10 MG tablet TAKE 1 TABLET BY MOUTH EVERY  DAY   atorvastatin (LIPITOR) 40 MG tablet Take 1 tablet (40 mg total) by mouth daily. Please make yearly appt with Dr. NAcie Fredricksonfor April 2022 for future refills. Thank you 1st attempt   blood glucose meter kit and supplies KIT Dispense based on patient and insurance preference. Patient should test sugars twice daily as directed. Dx. E11.9   DULoxetine (CYMBALTA) 20 MG capsule TAKE 1 CAPSULE BY MOUTH EVERY DAY   hydrALAZINE (APRESOLINE) 100 MG tablet TAKE 1 TABLET BY MOUTH TWICE A DAY   HYDROcodone-acetaminophen (NORCO/VICODIN) 5-325 MG tablet Take 1 tablet by mouth every 6 (six) hours as needed for moderate pain.   isosorbide dinitrate (ISORDIL) 20 MG tablet TAKE 1 TABLET BY MOUTH THREE TIMES A DAY   losartan (COZAAR) 50 MG tablet TAKE 1 TABLET BY MOUTH EVERY DAY   multivitamin (RENA-VIT) TABS tablet Take 1 tablet by mouth every morning.    omeprazole (PRILOSEC) 40 MG capsule TAKE 1 CAPSULE BY MOUTH EVERY DAY   ONETOUCH ULTRA test strip USE AS INSTRUCTED TO TEST SUGARS TWICE DAILY. DX. E11.9   pregabalin (LYRICA) 100 MG capsule Take 100 mg by mouth 3 (three) times daily.   QUEtiapine (SEROQUEL) 100 MG tablet TAKE 1 TABLET BY MOUTH EVERYDAY AT BEDTIME   traZODone (DESYREL) 50 MG tablet Take 50 mg by mouth at bedtime as needed for sleep.  No facility-administered encounter medications on file as of 01/21/2022.    Allergies (verified) Lisinopril, Lyrica [pregabalin], Daypro [oxaprozin], and Pregabalin er   History: Past Medical History:  Diagnosis Date   Adenomatous colon polyp 04/1998   Anemia    Anxiety    Arthritis    HIp   Carotid artery disease ([place])    Carotid US 1/18: bilateral ICA 1-39, R thyroid lobe nodule (1.9x2.2x3cm); numerous L thyroid lobe nodules - repeat 1 year   Depression    Diabetes mellitus    diet controlled   EBV infection    Per [facility]   Encephalitis    NMDA per [facility]   End-stage renal disease (ESRD) ([place])    hx of renal transplant failed. [place] [facility].     Esophagitis    Grade 1 Distal   GERD (gastroesophageal reflux disease)    Hemorrhoids    History of blood transfusion    History of kidney stones    Passed   History of subdural hematoma    Hyperkalemia    Hyperlipidemia    Hypertension    Hypomagnesemia    Left jugular vein thrombosis    Partial resolved per [facility]   Metabolic acidosis    On home oxygen therapy    2.5 liters   Pneumonia    Right jugular vein thrombosis    resolved from [place]   Seizures [facility])    Past Surgical History:  Procedure Laterality Date   A/V FISTULAGRAM Left 06/04/2017   Procedure: A/V FISTULAGRAM;  Surgeon: Conrad Hilltop, MD;  Location: [place] [facility];  Service: Cardiovascular;  Laterality: Left;   A/V FISTULAGRAM N/A 03/24/2019   Procedure: A/V FISTULAGRAM - left arm;  Surgeon: [name], MD;  Location: [place] [facility];  Service: Cardiovascular;  Laterality: N/A;   A/V FISTULAGRAM N/A 03/08/2020   Procedure: A/V FISTULAGRAM - Left arm;  Surgeon: [name], MD;  Location: [place] [facility];  Service: Cardiovascular;  Laterality: N/A;   ABDOMINAL HYSTERECTOMY     AV FISTULA PLACEMENT  07/04/2005   [place] AV fistula   AV FISTULA PLACEMENT  08/27/2005   AV FISTULA PLACEMENT Left 04/29/2017   Procedure: Creation of Left Arm ARTERIOVENOUS BRACHIOCEPHALIC FISTULA;  Surgeon: Elam Dutch, MD;  Location: [place];  Service: Vascular;  Laterality: Left;   AV FISTULA PLACEMENT Left 06/16/2017   Procedure: BRACHIO_BASCILIC ARTERIOVENOUS (AV) FISTULA CREATION;  Surgeon: Waynetta Sandy, MD;  Location: [place];  Service: Vascular;  Laterality: Left;   AV FISTULA PLACEMENT W/ PTFE  16/12/9602   BASCILIC VEIN TRANSPOSITION Left 06/16/2017   Procedure: BRACHIOCEPHALIC TRANSPOSITION  LEFT ARM;  Surgeon: Waynetta Sandy, MD;  Location: [place];  Service: Vascular;  Laterality: Left;   [place] Left 09/01/2017   Procedure: BASILIC VEIN TRANSPOSITION SECOND  STAGE;  Surgeon: Waynetta Sandy, MD;  Location: [place];  Service: Vascular;  Laterality: Left;   BRAIN BIOPSY     CESAREAN SECTION     COLONOSCOPY W/ POLYPECTOMY     DG AV DIALYSIS GRAFT DECLOT OR  07/24/2005   AV [place] graf   DG AV DIALYSIS GRAFT DECLOT OR  Thrombosis right forearm, loop arteriovenous   Thrombosis right forearm, loop arteriovenous graft   FISTULA SUPERFICIALIZATION Left 5/40/9811   Procedure: PLICATIONS OF ANEURYSM;  Surgeon: [name], MD;  Location: [place];  Service: Vascular;  Laterality: Left;   GASTROSTOMY TUBE PLACEMENT     INSERTION OF  DIALYSIS CATHETER N/A 04/04/2020   Procedure: TUNNELED DIALYSIS CATHETER PLACEMENT;  Surgeon: [name], MD;  Location: [place];  Service: Vascular;  Laterality: N/A;   KIDNEY TRANSPLANT  2009   Both   PERIPHERAL VASCULAR BALLOON ANGIOPLASTY Left 03/24/2019   Procedure: PERIPHERAL VASCULAR BALLOON ANGIOPLASTY;  Surgeon: [name], MD;  Location: [place] [facility];  Service: Cardiovascular;  Laterality: Left;  left AV fistula   PERIPHERAL VASCULAR BALLOON ANGIOPLASTY  03/08/2020   Procedure: PERIPHERAL VASCULAR BALLOON ANGIOPLASTY;  Surgeon: [name], MD;  Location: [place] [facility];  Service: Cardiovascular;;   REMOVAL OF GASTROSTOMY TUBE     REVISON OF ARTERIOVENOUS FISTULA Left 11/23/2018   Procedure: REVISION PLICATION OF ARTERIOVENOUS FISTULA;  Surgeon: Waynetta Sandy, MD;  Location: [place];  Service: Vascular;  Laterality: Left;   REVISON OF ARTERIOVENOUS FISTULA Left 04/04/2020   Procedure: LEFT ARM ARTERIOVENOUS FISTULA REVISON;  Surgeon: [name], MD;  [facility];  Service: Vascular;  Laterality: Left;   THROMBECTOMY / ARTERIOVENOUS GRAFT REVISION  10/12/2006   THROMBECTOMY / ARTERIOVENOUS GRAFT REVISION  10/16/2006   Family History  Problem Relation Age of Onset   Kidney disease Paternal Aunt    Heart disease Mother    Heart disease Father     Colon cancer Neg Hx    Social History   Socioeconomic History   Marital status: Widowed    Spouse name: Not on file   Number of children: 2   Years of education: 78   Highest education level: Not on file  Occupational History   Occupation: Disabled  Tobacco Use   Smoking status: Former   Smokeless tobacco: Never   Tobacco comments:    smoked in teens  Vaping Use   Vaping Use: Never used  Substance and Sexual Activity   Alcohol use: Not Currently   Drug use: No   Sexual activity: Never  Other Topics Concern   Not on file  Social History Narrative   Denies caffeine use    Social Determinants of Health   Financial Resource Strain: Low Risk  (01/21/2022)   Overall Financial Resource Strain (CARDIA)    Difficulty of Paying Living Expenses: Not hard at all  Food Insecurity: No Food Insecurity (01/21/2022)   Hunger Vital Sign    Worried About Running Out of Food in the Last Year: Never true    [place] in the Last Year: Never true  Transportation Needs: No Transportation Needs (01/21/2022)   PRAPARE - Hydrologist (Medical): No    Lack of Transportation (Non-Medical): No  Physical Activity: Inactive (01/21/2022)   Exercise Vital Sign    Days of Exercise per Week: 0 days    Minutes of Exercise per Session: 0 min  Stress: No Stress Concern Present (01/21/2022)   [place]    Feeling of Stress : Not at all  Social Connections: Socially Isolated (01/21/2022)   Social Connection and Isolation Panel [NHANES]    Frequency of Communication with Friends and Family: More than three times a week    Frequency of Social Gatherings with Friends and Family: More than three times a week    Attends Religious Services: Never    Marine scientist or Organizations: No    Attends Archivist Meetings: Never    Marital Status: Widowed    Tobacco Counseling Counseling  given: Not Answered Tobacco  comments: smoked in teens   Clinical Intake:  Pre-visit preparation completed: Yes  Pain : No/denies pain     Nutritional Risks: None Diabetes: No  How often do you need to have someone help you when you read instructions, pamphlets, or other written materials from your doctor or pharmacy?: 1 - Never  Diabetic?yes  Nutrition Risk Assessment:  Has the patient had any N/V/D within the last 2 months?  No  Does the patient have any non-healing wounds?  No  Has the patient had any unintentional weight loss or weight gain?  No   Diabetes:  Is the patient diabetic?  Yes  If diabetic, was a CBG obtained today?  No  Did the patient bring in their glucometer from home?  No  How often do you monitor your CBG's? Never .   Financial Strains and Diabetes Management:  Are you having any financial strains with the device, your supplies or your medication? No .  Does the patient want to be seen by Chronic Care Management for management of their diabetes?  No  Would the patient like to be referred to a Nutritionist or for Diabetic Management?  No   Diabetic Exams:  Diabetic Eye Exam: Completed 03/2021 Diabetic Foot Exam: Overdue, Pt has been advised about the importance in completing this exam. Pt is scheduled for diabetic foot exam on next office visit .   Interpreter Needed?: No  Information entered by :: Jadene Pierini, LPN   Activities of Daily Living    01/21/2022    9:15 AM 05/02/2021    1:20 PM  In your present state of health, do you have any difficulty performing the following activities:  Hearing? 0 0  Vision? 0 1  Difficulty concentrating or making decisions? 0 1  Walking or climbing stairs? 0 0  Dressing or bathing? 0 1  Doing errands, shopping? 0 0  Preparing Food and eating ? N   Using the Toilet? N   In the past six months, have you accidently leaked urine? N   Do you have problems with loss of bowel control? N   Managing your  Medications? N   Managing your Finances? N   Housekeeping or managing your Housekeeping? N     Patient Care Team: Midge Minium, MD as PCP - General (Family Medicine) Ladene Artist, MD as Consulting Physician (Gastroenterology) Zadie Rhine Clent Demark, MD as Consulting Physician (Ophthalmology) Everlene Farrier, MD as Consulting Physician (Obstetrics and Gynecology) Mauricia Area, MD as Consulting Physician (Nephrology) Nahser, Wonda Cheng, MD as Consulting Physician (Cardiology) [facility], [facility] as Referring Physician (Nephrology) Waynetta Sandy, MD as Consulting Physician (Vascular Surgery)  Indicate any recent Medical Services you may have received from other than [facility] providers in the past year (date may be approximate).     Assessment:   This is a routine wellness examination for Sherlene.  Hearing/Vision screen Vision Screening - Comments:: Annual eye exams wears glasses Dr.Heck   Dietary issues and exercise activities discussed: Current Exercise Habits: The patient does not participate in regular exercise at present   Goals Addressed             This Visit's Progress    Patient Stated   On track    Would like to start walking more       Depression Screen    01/21/2022    9:13 AM 12/03/2021   11:29 AM 06/12/2021    3:06 PM 05/02/2021    1:22  PM 09/20/2020   10:51 AM 09/13/2020    1:06 PM 07/17/2020    8:31 AM  PHQ 2/9 Scores  PHQ - 2 Score 0 0 0 0 0 0 0  PHQ- 9 Score    6 0 0 0    Fall Risk    01/21/2022    9:12 AM 12/03/2021   11:15 AM 06/12/2021    3:05 PM 05/02/2021    1:22 PM 09/20/2020   10:51 AM  Fall Risk   Falls in the past year? 0 _0 0  Number falls in past yr: 0  0 0 0  Injury with Fall? 0  0 0 0  Risk for fall due to : No Fall Risks  History of fall(s);Impaired balance/gait;Impaired mobility History of fall(s);Impaired balance/gait No Fall Risks  Follow up Falls prevention discussed  Education provided;Falls  prevention discussed Falls evaluation completed     FALL RISK PREVENTION PERTAINING TO THE HOME:  Any stairs in or around the home? Yes  If so, are there any without handrails? No  Home free of loose throw rugs in walkways, pet beds, electrical cords, etc? Yes  Adequate lighting in your home to reduce risk of falls? Yes   ASSISTIVE DEVICES UTILIZED TO PREVENT FALLS:  Life alert? No  Use of a cane, walker or w/c? Yes  Grab bars in the bathroom? No  Shower chair or bench in shower? Yes  Elevated toilet seat or a handicapped toilet? No      06/30/2016    3:19 PM  MMSE - Mini Mental State Exam  Orientation to time 5  Orientation to Place 5  Registration 3  Attention/ Calculation 4  Recall 0  Language- name 2 objects 2  Language- repeat 1  Language- follow 3 step command 3  Language- read & follow direction 1  Write a sentence 1  Copy design 1  Total score 26        01/21/2022    9:15 AM 09/27/2019    1:40 PM  6CIT Screen  What Year? 0 points 0 points  What month? 0 points 0 points  What time? 0 points 0 points  Count back from 20 0 points 0 points  Months in reverse 0 points 0 points  Repeat phrase 0 points 2 points  Total Score 0 points 2 points    Immunizations Immunization History  Administered Date(s) Administered   Hepatitis B, adult 04/17/2017, 05/20/2017, 06/19/2017, 10/16/2017, 04/21/2018, 04/27/2019   Influenza,inj,Quad PF,6+ Mos 10/28/2015, 05/07/2017, 12/24/2017, 12/20/2018, 12/01/2019, 01/21/2021, 12/06/2021   PFIZER(Purple Top)SARS-COV-2 Vaccination 06/04/2019, 06/25/2019, 12/30/2019   Pneumococcal Conjugate-13 07/14/2018   Pneumococcal Polysaccharide-23 10/01/2015   Tdap 02/03/2019    TDAP status: Up to date  Flu Vaccine status: Up to date  Pneumococcal vaccine status: Up to date  Covid-19 vaccine status: Completed vaccines  Qualifies for Shingles Vaccine? Yes   Zostavax completed No   Shingrix Completed?: No.    Education has been provided  regarding the importance of this vaccine. Patient has been advised to call insurance company to determine out of pocket expense if they have not yet received this vaccine. Advised may also receive vaccine at local pharmacy or Health Dept. Verbalized acceptance and understanding.  Screening Tests Health Maintenance  Topic Date Due   Zoster Vaccines- Shingrix (1 of 2) Never done   COLONOSCOPY (Pts 45-65yr Insurance coverage will need to be confirmed)  06/20/2019   OPHTHALMOLOGY EXAM  02/22/2020   COVID-19 Vaccine (4 - [employer]  risk series) 02/24/2020   MAMMOGRAM  05/09/2020   HEMOGLOBIN A1C  06/03/2022   FOOT EXAM  12/04/2022   Medicare Annual Wellness (AWV)  01/22/2023   TETANUS/TDAP  02/02/2029   INFLUENZA VACCINE  Completed   Hepatitis C Screening  Completed   HIV Screening  Completed   HPV VACCINES  Aged Out    Health Maintenance  Health Maintenance Due  Topic Date Due   Zoster Vaccines- Shingrix (1 of 2) Never done   COLONOSCOPY (Pts 45-48yr Insurance coverage will need to be confirmed)  06/20/2019   OPHTHALMOLOGY EXAM  02/22/2020   COVID-19 Vaccine (4 - Pfizer risk series) 02/24/2020   MAMMOGRAM  05/09/2020    Colorectal cancer screening: Referral to GI placed 05/02/2021. Pt aware the office will call re: appt.  Mammogram status: Ordered 05/02/2021. Pt provided with contact info and advised to call to schedule appt.   Bone Density status: Ordered Not of age . Pt provided with contact info and advised to call to schedule appt.  Lung Cancer Screening: (Low Dose CT Chest recommended if Age 64-80years, 30 pack-year currently smoking OR have quit w/in 15years.) does not qualify.   Lung Cancer Screening Referral: n/a  Additional Screening:  Hepatitis C Screening: does not qualify;   Vision Screening: Recommended annual ophthalmology exams for early detection of glaucoma and other disorders of the eye. Is the patient up to date with their annual eye exam?  Yes  Who is the  provider or what is the name of the office in which the patient attends annual eye exams? Dr.Heck  If pt is not established with a provider, would they like to be referred to a provider to establish care? No .   Dental Screening: Recommended annual dental exams for proper oral hygiene  Community Resource Referral / Chronic Care Management: CRR required this visit?  No   CCM required this visit?  No      Plan:     I have personally reviewed and noted the following in the patient's chart:   Medical and social history Use of alcohol, tobacco or illicit drugs  Current medications and supplements including opioid prescriptions. Patient is not currently taking opioid prescriptions. Functional ability and status Nutritional status Physical activity Advanced directives List of other physicians Hospitalizations, surgeries, and ER visits in previous 12 months Vitals Screenings to include cognitive, depression, and falls Referrals and appointments  In addition, I have reviewed and discussed with patient certain preventive protocols, quality metrics, and best practice recommendations. A written personalized care plan for preventive services as well as general preventive health recommendations were provided to patient.     LDaphane Shepherd LPN   [id number]/29/[id number]  Nurse Notes: Due Mammogram and Colonoscopy

## 2022-01-22 DIAGNOSIS — N186 End stage renal disease: Secondary | ICD-10-CM | POA: Diagnosis not present

## 2022-01-22 DIAGNOSIS — R0602 Shortness of breath: Secondary | ICD-10-CM | POA: Diagnosis not present

## 2022-01-22 DIAGNOSIS — D689 Coagulation defect, unspecified: Secondary | ICD-10-CM | POA: Diagnosis not present

## 2022-01-22 DIAGNOSIS — Z992 Dependence on renal dialysis: Secondary | ICD-10-CM | POA: Diagnosis not present

## 2022-01-22 DIAGNOSIS — N2581 Secondary hyperparathyroidism of renal origin: Secondary | ICD-10-CM | POA: Diagnosis not present

## 2022-01-24 DIAGNOSIS — Z992 Dependence on renal dialysis: Secondary | ICD-10-CM | POA: Diagnosis not present

## 2022-01-24 DIAGNOSIS — D689 Coagulation defect, unspecified: Secondary | ICD-10-CM | POA: Diagnosis not present

## 2022-01-24 DIAGNOSIS — N186 End stage renal disease: Secondary | ICD-10-CM | POA: Diagnosis not present

## 2022-01-24 DIAGNOSIS — N2581 Secondary hyperparathyroidism of renal origin: Secondary | ICD-10-CM | POA: Diagnosis not present

## 2022-01-27 DIAGNOSIS — L299 Pruritus, unspecified: Secondary | ICD-10-CM | POA: Diagnosis not present

## 2022-01-27 DIAGNOSIS — Z23 Encounter for immunization: Secondary | ICD-10-CM | POA: Diagnosis not present

## 2022-01-27 DIAGNOSIS — N186 End stage renal disease: Secondary | ICD-10-CM | POA: Diagnosis not present

## 2022-01-27 DIAGNOSIS — D689 Coagulation defect, unspecified: Secondary | ICD-10-CM | POA: Diagnosis not present

## 2022-01-27 DIAGNOSIS — N2581 Secondary hyperparathyroidism of renal origin: Secondary | ICD-10-CM | POA: Diagnosis not present

## 2022-01-27 DIAGNOSIS — Z992 Dependence on renal dialysis: Secondary | ICD-10-CM | POA: Diagnosis not present

## 2022-01-29 DIAGNOSIS — Z992 Dependence on renal dialysis: Secondary | ICD-10-CM | POA: Diagnosis not present

## 2022-01-29 DIAGNOSIS — N186 End stage renal disease: Secondary | ICD-10-CM | POA: Diagnosis not present

## 2022-01-29 DIAGNOSIS — L299 Pruritus, unspecified: Secondary | ICD-10-CM | POA: Diagnosis not present

## 2022-01-29 DIAGNOSIS — Z23 Encounter for immunization: Secondary | ICD-10-CM | POA: Diagnosis not present

## 2022-01-29 DIAGNOSIS — D689 Coagulation defect, unspecified: Secondary | ICD-10-CM | POA: Diagnosis not present

## 2022-01-29 DIAGNOSIS — N2581 Secondary hyperparathyroidism of renal origin: Secondary | ICD-10-CM | POA: Diagnosis not present

## 2022-01-31 DIAGNOSIS — N186 End stage renal disease: Secondary | ICD-10-CM | POA: Diagnosis not present

## 2022-01-31 DIAGNOSIS — Z23 Encounter for immunization: Secondary | ICD-10-CM | POA: Diagnosis not present

## 2022-01-31 DIAGNOSIS — D689 Coagulation defect, unspecified: Secondary | ICD-10-CM | POA: Diagnosis not present

## 2022-01-31 DIAGNOSIS — Z992 Dependence on renal dialysis: Secondary | ICD-10-CM | POA: Diagnosis not present

## 2022-01-31 DIAGNOSIS — N2581 Secondary hyperparathyroidism of renal origin: Secondary | ICD-10-CM | POA: Diagnosis not present

## 2022-01-31 DIAGNOSIS — L299 Pruritus, unspecified: Secondary | ICD-10-CM | POA: Diagnosis not present

## 2022-02-03 DIAGNOSIS — N186 End stage renal disease: Secondary | ICD-10-CM | POA: Diagnosis not present

## 2022-02-03 DIAGNOSIS — N2581 Secondary hyperparathyroidism of renal origin: Secondary | ICD-10-CM | POA: Diagnosis not present

## 2022-02-03 DIAGNOSIS — Z139 Encounter for screening, unspecified: Secondary | ICD-10-CM | POA: Diagnosis not present

## 2022-02-03 DIAGNOSIS — Z992 Dependence on renal dialysis: Secondary | ICD-10-CM | POA: Diagnosis not present

## 2022-02-03 DIAGNOSIS — E1129 Type 2 diabetes mellitus with other diabetic kidney complication: Secondary | ICD-10-CM | POA: Diagnosis not present

## 2022-02-03 DIAGNOSIS — L299 Pruritus, unspecified: Secondary | ICD-10-CM | POA: Diagnosis not present

## 2022-02-03 DIAGNOSIS — D689 Coagulation defect, unspecified: Secondary | ICD-10-CM | POA: Diagnosis not present

## 2022-02-05 DIAGNOSIS — E1129 Type 2 diabetes mellitus with other diabetic kidney complication: Secondary | ICD-10-CM | POA: Diagnosis not present

## 2022-02-05 DIAGNOSIS — L299 Pruritus, unspecified: Secondary | ICD-10-CM | POA: Diagnosis not present

## 2022-02-05 DIAGNOSIS — D689 Coagulation defect, unspecified: Secondary | ICD-10-CM | POA: Diagnosis not present

## 2022-02-05 DIAGNOSIS — N2581 Secondary hyperparathyroidism of renal origin: Secondary | ICD-10-CM | POA: Diagnosis not present

## 2022-02-05 DIAGNOSIS — N186 End stage renal disease: Secondary | ICD-10-CM | POA: Diagnosis not present

## 2022-02-05 DIAGNOSIS — Z992 Dependence on renal dialysis: Secondary | ICD-10-CM | POA: Diagnosis not present

## 2022-02-05 DIAGNOSIS — Z139 Encounter for screening, unspecified: Secondary | ICD-10-CM | POA: Diagnosis not present

## 2022-02-07 DIAGNOSIS — E1129 Type 2 diabetes mellitus with other diabetic kidney complication: Secondary | ICD-10-CM | POA: Diagnosis not present

## 2022-02-07 DIAGNOSIS — Z139 Encounter for screening, unspecified: Secondary | ICD-10-CM | POA: Diagnosis not present

## 2022-02-07 DIAGNOSIS — D689 Coagulation defect, unspecified: Secondary | ICD-10-CM | POA: Diagnosis not present

## 2022-02-07 DIAGNOSIS — L299 Pruritus, unspecified: Secondary | ICD-10-CM | POA: Diagnosis not present

## 2022-02-07 DIAGNOSIS — N2581 Secondary hyperparathyroidism of renal origin: Secondary | ICD-10-CM | POA: Diagnosis not present

## 2022-02-07 DIAGNOSIS — N186 End stage renal disease: Secondary | ICD-10-CM | POA: Diagnosis not present

## 2022-02-07 DIAGNOSIS — Z992 Dependence on renal dialysis: Secondary | ICD-10-CM | POA: Diagnosis not present

## 2022-02-09 DIAGNOSIS — D689 Coagulation defect, unspecified: Secondary | ICD-10-CM | POA: Diagnosis not present

## 2022-02-09 DIAGNOSIS — L298 Other pruritus: Secondary | ICD-10-CM | POA: Diagnosis not present

## 2022-02-09 DIAGNOSIS — N186 End stage renal disease: Secondary | ICD-10-CM | POA: Diagnosis not present

## 2022-02-09 DIAGNOSIS — Z992 Dependence on renal dialysis: Secondary | ICD-10-CM | POA: Diagnosis not present

## 2022-02-09 DIAGNOSIS — N2581 Secondary hyperparathyroidism of renal origin: Secondary | ICD-10-CM | POA: Diagnosis not present

## 2022-02-12 DIAGNOSIS — N2581 Secondary hyperparathyroidism of renal origin: Secondary | ICD-10-CM | POA: Diagnosis not present

## 2022-02-12 DIAGNOSIS — D689 Coagulation defect, unspecified: Secondary | ICD-10-CM | POA: Diagnosis not present

## 2022-02-12 DIAGNOSIS — Z992 Dependence on renal dialysis: Secondary | ICD-10-CM | POA: Diagnosis not present

## 2022-02-12 DIAGNOSIS — N186 End stage renal disease: Secondary | ICD-10-CM | POA: Diagnosis not present

## 2022-02-14 ENCOUNTER — Other Ambulatory Visit: Payer: Self-pay | Admitting: Family Medicine

## 2022-02-14 DIAGNOSIS — Z992 Dependence on renal dialysis: Secondary | ICD-10-CM | POA: Diagnosis not present

## 2022-02-14 DIAGNOSIS — N2581 Secondary hyperparathyroidism of renal origin: Secondary | ICD-10-CM | POA: Diagnosis not present

## 2022-02-14 DIAGNOSIS — D689 Coagulation defect, unspecified: Secondary | ICD-10-CM | POA: Diagnosis not present

## 2022-02-14 DIAGNOSIS — N186 End stage renal disease: Secondary | ICD-10-CM | POA: Diagnosis not present

## 2022-02-17 ENCOUNTER — Other Ambulatory Visit: Payer: Self-pay | Admitting: Family Medicine

## 2022-02-17 DIAGNOSIS — N2581 Secondary hyperparathyroidism of renal origin: Secondary | ICD-10-CM | POA: Diagnosis not present

## 2022-02-17 DIAGNOSIS — D689 Coagulation defect, unspecified: Secondary | ICD-10-CM | POA: Diagnosis not present

## 2022-02-17 DIAGNOSIS — L299 Pruritus, unspecified: Secondary | ICD-10-CM | POA: Diagnosis not present

## 2022-02-17 DIAGNOSIS — N186 End stage renal disease: Secondary | ICD-10-CM | POA: Diagnosis not present

## 2022-02-17 DIAGNOSIS — Z992 Dependence on renal dialysis: Secondary | ICD-10-CM | POA: Diagnosis not present

## 2022-02-18 ENCOUNTER — Other Ambulatory Visit: Payer: Self-pay | Admitting: Family Medicine

## 2022-02-19 DIAGNOSIS — D689 Coagulation defect, unspecified: Secondary | ICD-10-CM | POA: Diagnosis not present

## 2022-02-19 DIAGNOSIS — Z992 Dependence on renal dialysis: Secondary | ICD-10-CM | POA: Diagnosis not present

## 2022-02-19 DIAGNOSIS — N186 End stage renal disease: Secondary | ICD-10-CM | POA: Diagnosis not present

## 2022-02-19 DIAGNOSIS — L299 Pruritus, unspecified: Secondary | ICD-10-CM | POA: Diagnosis not present

## 2022-02-19 DIAGNOSIS — N2581 Secondary hyperparathyroidism of renal origin: Secondary | ICD-10-CM | POA: Diagnosis not present

## 2022-02-20 DIAGNOSIS — N186 End stage renal disease: Secondary | ICD-10-CM | POA: Diagnosis not present

## 2022-02-20 DIAGNOSIS — Z992 Dependence on renal dialysis: Secondary | ICD-10-CM | POA: Diagnosis not present

## 2022-02-20 DIAGNOSIS — I150 Renovascular hypertension: Secondary | ICD-10-CM | POA: Diagnosis not present

## 2022-02-21 DIAGNOSIS — D689 Coagulation defect, unspecified: Secondary | ICD-10-CM | POA: Diagnosis not present

## 2022-02-21 DIAGNOSIS — N2581 Secondary hyperparathyroidism of renal origin: Secondary | ICD-10-CM | POA: Diagnosis not present

## 2022-02-21 DIAGNOSIS — L299 Pruritus, unspecified: Secondary | ICD-10-CM | POA: Diagnosis not present

## 2022-02-21 DIAGNOSIS — Z992 Dependence on renal dialysis: Secondary | ICD-10-CM | POA: Diagnosis not present

## 2022-02-21 DIAGNOSIS — R0602 Shortness of breath: Secondary | ICD-10-CM | POA: Diagnosis not present

## 2022-02-21 DIAGNOSIS — N186 End stage renal disease: Secondary | ICD-10-CM | POA: Diagnosis not present

## 2022-02-24 DIAGNOSIS — D689 Coagulation defect, unspecified: Secondary | ICD-10-CM | POA: Diagnosis not present

## 2022-02-24 DIAGNOSIS — N186 End stage renal disease: Secondary | ICD-10-CM | POA: Diagnosis not present

## 2022-02-24 DIAGNOSIS — N2581 Secondary hyperparathyroidism of renal origin: Secondary | ICD-10-CM | POA: Diagnosis not present

## 2022-02-24 DIAGNOSIS — D631 Anemia in chronic kidney disease: Secondary | ICD-10-CM | POA: Diagnosis not present

## 2022-02-24 DIAGNOSIS — L299 Pruritus, unspecified: Secondary | ICD-10-CM | POA: Diagnosis not present

## 2022-02-24 DIAGNOSIS — Z992 Dependence on renal dialysis: Secondary | ICD-10-CM | POA: Diagnosis not present

## 2022-02-26 DIAGNOSIS — D689 Coagulation defect, unspecified: Secondary | ICD-10-CM | POA: Diagnosis not present

## 2022-02-26 DIAGNOSIS — D631 Anemia in chronic kidney disease: Secondary | ICD-10-CM | POA: Diagnosis not present

## 2022-02-26 DIAGNOSIS — L299 Pruritus, unspecified: Secondary | ICD-10-CM | POA: Diagnosis not present

## 2022-02-26 DIAGNOSIS — N186 End stage renal disease: Secondary | ICD-10-CM | POA: Diagnosis not present

## 2022-02-26 DIAGNOSIS — Z992 Dependence on renal dialysis: Secondary | ICD-10-CM | POA: Diagnosis not present

## 2022-02-26 DIAGNOSIS — N2581 Secondary hyperparathyroidism of renal origin: Secondary | ICD-10-CM | POA: Diagnosis not present

## 2022-02-28 DIAGNOSIS — N186 End stage renal disease: Secondary | ICD-10-CM | POA: Diagnosis not present

## 2022-02-28 DIAGNOSIS — D631 Anemia in chronic kidney disease: Secondary | ICD-10-CM | POA: Diagnosis not present

## 2022-02-28 DIAGNOSIS — N2581 Secondary hyperparathyroidism of renal origin: Secondary | ICD-10-CM | POA: Diagnosis not present

## 2022-02-28 DIAGNOSIS — D689 Coagulation defect, unspecified: Secondary | ICD-10-CM | POA: Diagnosis not present

## 2022-02-28 DIAGNOSIS — Z992 Dependence on renal dialysis: Secondary | ICD-10-CM | POA: Diagnosis not present

## 2022-02-28 DIAGNOSIS — L299 Pruritus, unspecified: Secondary | ICD-10-CM | POA: Diagnosis not present

## 2022-03-03 DIAGNOSIS — D689 Coagulation defect, unspecified: Secondary | ICD-10-CM | POA: Diagnosis not present

## 2022-03-03 DIAGNOSIS — L299 Pruritus, unspecified: Secondary | ICD-10-CM | POA: Diagnosis not present

## 2022-03-03 DIAGNOSIS — N186 End stage renal disease: Secondary | ICD-10-CM | POA: Diagnosis not present

## 2022-03-03 DIAGNOSIS — Z992 Dependence on renal dialysis: Secondary | ICD-10-CM | POA: Diagnosis not present

## 2022-03-03 DIAGNOSIS — N2581 Secondary hyperparathyroidism of renal origin: Secondary | ICD-10-CM | POA: Diagnosis not present

## 2022-03-03 DIAGNOSIS — D631 Anemia in chronic kidney disease: Secondary | ICD-10-CM | POA: Diagnosis not present

## 2022-03-05 DIAGNOSIS — N2581 Secondary hyperparathyroidism of renal origin: Secondary | ICD-10-CM | POA: Diagnosis not present

## 2022-03-05 DIAGNOSIS — D689 Coagulation defect, unspecified: Secondary | ICD-10-CM | POA: Diagnosis not present

## 2022-03-05 DIAGNOSIS — D631 Anemia in chronic kidney disease: Secondary | ICD-10-CM | POA: Diagnosis not present

## 2022-03-05 DIAGNOSIS — L299 Pruritus, unspecified: Secondary | ICD-10-CM | POA: Diagnosis not present

## 2022-03-05 DIAGNOSIS — Z992 Dependence on renal dialysis: Secondary | ICD-10-CM | POA: Diagnosis not present

## 2022-03-05 DIAGNOSIS — N186 End stage renal disease: Secondary | ICD-10-CM | POA: Diagnosis not present

## 2022-03-07 DIAGNOSIS — Z992 Dependence on renal dialysis: Secondary | ICD-10-CM | POA: Diagnosis not present

## 2022-03-07 DIAGNOSIS — N186 End stage renal disease: Secondary | ICD-10-CM | POA: Diagnosis not present

## 2022-03-07 DIAGNOSIS — D631 Anemia in chronic kidney disease: Secondary | ICD-10-CM | POA: Diagnosis not present

## 2022-03-07 DIAGNOSIS — L299 Pruritus, unspecified: Secondary | ICD-10-CM | POA: Diagnosis not present

## 2022-03-07 DIAGNOSIS — D689 Coagulation defect, unspecified: Secondary | ICD-10-CM | POA: Diagnosis not present

## 2022-03-07 DIAGNOSIS — N2581 Secondary hyperparathyroidism of renal origin: Secondary | ICD-10-CM | POA: Diagnosis not present

## 2022-03-10 DIAGNOSIS — Z23 Encounter for immunization: Secondary | ICD-10-CM | POA: Diagnosis not present

## 2022-03-10 DIAGNOSIS — L299 Pruritus, unspecified: Secondary | ICD-10-CM | POA: Diagnosis not present

## 2022-03-10 DIAGNOSIS — Z992 Dependence on renal dialysis: Secondary | ICD-10-CM | POA: Diagnosis not present

## 2022-03-10 DIAGNOSIS — D689 Coagulation defect, unspecified: Secondary | ICD-10-CM | POA: Diagnosis not present

## 2022-03-10 DIAGNOSIS — N2581 Secondary hyperparathyroidism of renal origin: Secondary | ICD-10-CM | POA: Diagnosis not present

## 2022-03-10 DIAGNOSIS — N186 End stage renal disease: Secondary | ICD-10-CM | POA: Diagnosis not present

## 2022-03-10 DIAGNOSIS — E1129 Type 2 diabetes mellitus with other diabetic kidney complication: Secondary | ICD-10-CM | POA: Diagnosis not present

## 2022-03-10 DIAGNOSIS — D631 Anemia in chronic kidney disease: Secondary | ICD-10-CM | POA: Diagnosis not present

## 2022-03-12 DIAGNOSIS — D631 Anemia in chronic kidney disease: Secondary | ICD-10-CM | POA: Diagnosis not present

## 2022-03-12 DIAGNOSIS — N186 End stage renal disease: Secondary | ICD-10-CM | POA: Diagnosis not present

## 2022-03-12 DIAGNOSIS — L299 Pruritus, unspecified: Secondary | ICD-10-CM | POA: Diagnosis not present

## 2022-03-12 DIAGNOSIS — Z23 Encounter for immunization: Secondary | ICD-10-CM | POA: Diagnosis not present

## 2022-03-12 DIAGNOSIS — Z992 Dependence on renal dialysis: Secondary | ICD-10-CM | POA: Diagnosis not present

## 2022-03-12 DIAGNOSIS — N2581 Secondary hyperparathyroidism of renal origin: Secondary | ICD-10-CM | POA: Diagnosis not present

## 2022-03-12 DIAGNOSIS — E1129 Type 2 diabetes mellitus with other diabetic kidney complication: Secondary | ICD-10-CM | POA: Diagnosis not present

## 2022-03-12 DIAGNOSIS — D689 Coagulation defect, unspecified: Secondary | ICD-10-CM | POA: Diagnosis not present

## 2022-03-14 DIAGNOSIS — N186 End stage renal disease: Secondary | ICD-10-CM | POA: Diagnosis not present

## 2022-03-14 DIAGNOSIS — D631 Anemia in chronic kidney disease: Secondary | ICD-10-CM | POA: Diagnosis not present

## 2022-03-14 DIAGNOSIS — D689 Coagulation defect, unspecified: Secondary | ICD-10-CM | POA: Diagnosis not present

## 2022-03-14 DIAGNOSIS — Z23 Encounter for immunization: Secondary | ICD-10-CM | POA: Diagnosis not present

## 2022-03-14 DIAGNOSIS — Z992 Dependence on renal dialysis: Secondary | ICD-10-CM | POA: Diagnosis not present

## 2022-03-14 DIAGNOSIS — L299 Pruritus, unspecified: Secondary | ICD-10-CM | POA: Diagnosis not present

## 2022-03-14 DIAGNOSIS — N2581 Secondary hyperparathyroidism of renal origin: Secondary | ICD-10-CM | POA: Diagnosis not present

## 2022-03-14 DIAGNOSIS — E1129 Type 2 diabetes mellitus with other diabetic kidney complication: Secondary | ICD-10-CM | POA: Diagnosis not present

## 2022-03-16 DIAGNOSIS — N2581 Secondary hyperparathyroidism of renal origin: Secondary | ICD-10-CM | POA: Diagnosis not present

## 2022-03-16 DIAGNOSIS — Z992 Dependence on renal dialysis: Secondary | ICD-10-CM | POA: Diagnosis not present

## 2022-03-16 DIAGNOSIS — N186 End stage renal disease: Secondary | ICD-10-CM | POA: Diagnosis not present

## 2022-03-16 DIAGNOSIS — L299 Pruritus, unspecified: Secondary | ICD-10-CM | POA: Diagnosis not present

## 2022-03-16 DIAGNOSIS — D689 Coagulation defect, unspecified: Secondary | ICD-10-CM | POA: Diagnosis not present

## 2022-03-19 DIAGNOSIS — Z992 Dependence on renal dialysis: Secondary | ICD-10-CM | POA: Diagnosis not present

## 2022-03-19 DIAGNOSIS — L299 Pruritus, unspecified: Secondary | ICD-10-CM | POA: Diagnosis not present

## 2022-03-19 DIAGNOSIS — N186 End stage renal disease: Secondary | ICD-10-CM | POA: Diagnosis not present

## 2022-03-19 DIAGNOSIS — N2581 Secondary hyperparathyroidism of renal origin: Secondary | ICD-10-CM | POA: Diagnosis not present

## 2022-03-19 DIAGNOSIS — D689 Coagulation defect, unspecified: Secondary | ICD-10-CM | POA: Diagnosis not present

## 2022-03-21 DIAGNOSIS — Z992 Dependence on renal dialysis: Secondary | ICD-10-CM | POA: Diagnosis not present

## 2022-03-21 DIAGNOSIS — N186 End stage renal disease: Secondary | ICD-10-CM | POA: Diagnosis not present

## 2022-03-21 DIAGNOSIS — N2581 Secondary hyperparathyroidism of renal origin: Secondary | ICD-10-CM | POA: Diagnosis not present

## 2022-03-21 DIAGNOSIS — D689 Coagulation defect, unspecified: Secondary | ICD-10-CM | POA: Diagnosis not present

## 2022-03-21 DIAGNOSIS — L299 Pruritus, unspecified: Secondary | ICD-10-CM | POA: Diagnosis not present

## 2022-03-23 DIAGNOSIS — L299 Pruritus, unspecified: Secondary | ICD-10-CM | POA: Diagnosis not present

## 2022-03-23 DIAGNOSIS — I150 Renovascular hypertension: Secondary | ICD-10-CM | POA: Diagnosis not present

## 2022-03-23 DIAGNOSIS — D631 Anemia in chronic kidney disease: Secondary | ICD-10-CM | POA: Diagnosis not present

## 2022-03-23 DIAGNOSIS — N2581 Secondary hyperparathyroidism of renal origin: Secondary | ICD-10-CM | POA: Diagnosis not present

## 2022-03-23 DIAGNOSIS — Z992 Dependence on renal dialysis: Secondary | ICD-10-CM | POA: Diagnosis not present

## 2022-03-23 DIAGNOSIS — D689 Coagulation defect, unspecified: Secondary | ICD-10-CM | POA: Diagnosis not present

## 2022-03-23 DIAGNOSIS — N186 End stage renal disease: Secondary | ICD-10-CM | POA: Diagnosis not present

## 2022-03-24 DIAGNOSIS — R0602 Shortness of breath: Secondary | ICD-10-CM | POA: Diagnosis not present

## 2022-03-26 DIAGNOSIS — N2581 Secondary hyperparathyroidism of renal origin: Secondary | ICD-10-CM | POA: Diagnosis not present

## 2022-03-26 DIAGNOSIS — L298 Other pruritus: Secondary | ICD-10-CM | POA: Diagnosis not present

## 2022-03-26 DIAGNOSIS — N186 End stage renal disease: Secondary | ICD-10-CM | POA: Diagnosis not present

## 2022-03-26 DIAGNOSIS — D689 Coagulation defect, unspecified: Secondary | ICD-10-CM | POA: Diagnosis not present

## 2022-03-26 DIAGNOSIS — Z992 Dependence on renal dialysis: Secondary | ICD-10-CM | POA: Diagnosis not present

## 2022-03-26 DIAGNOSIS — L299 Pruritus, unspecified: Secondary | ICD-10-CM | POA: Diagnosis not present

## 2022-03-28 DIAGNOSIS — N2581 Secondary hyperparathyroidism of renal origin: Secondary | ICD-10-CM | POA: Diagnosis not present

## 2022-03-28 DIAGNOSIS — D689 Coagulation defect, unspecified: Secondary | ICD-10-CM | POA: Diagnosis not present

## 2022-03-28 DIAGNOSIS — L299 Pruritus, unspecified: Secondary | ICD-10-CM | POA: Diagnosis not present

## 2022-03-28 DIAGNOSIS — N186 End stage renal disease: Secondary | ICD-10-CM | POA: Diagnosis not present

## 2022-03-28 DIAGNOSIS — L298 Other pruritus: Secondary | ICD-10-CM | POA: Diagnosis not present

## 2022-03-28 DIAGNOSIS — Z992 Dependence on renal dialysis: Secondary | ICD-10-CM | POA: Diagnosis not present

## 2022-03-31 DIAGNOSIS — L298 Other pruritus: Secondary | ICD-10-CM | POA: Diagnosis not present

## 2022-03-31 DIAGNOSIS — D689 Coagulation defect, unspecified: Secondary | ICD-10-CM | POA: Diagnosis not present

## 2022-03-31 DIAGNOSIS — N2581 Secondary hyperparathyroidism of renal origin: Secondary | ICD-10-CM | POA: Diagnosis not present

## 2022-03-31 DIAGNOSIS — N186 End stage renal disease: Secondary | ICD-10-CM | POA: Diagnosis not present

## 2022-03-31 DIAGNOSIS — Z992 Dependence on renal dialysis: Secondary | ICD-10-CM | POA: Diagnosis not present

## 2022-03-31 DIAGNOSIS — L299 Pruritus, unspecified: Secondary | ICD-10-CM | POA: Diagnosis not present

## 2022-04-02 DIAGNOSIS — N2581 Secondary hyperparathyroidism of renal origin: Secondary | ICD-10-CM | POA: Diagnosis not present

## 2022-04-02 DIAGNOSIS — N186 End stage renal disease: Secondary | ICD-10-CM | POA: Diagnosis not present

## 2022-04-02 DIAGNOSIS — D689 Coagulation defect, unspecified: Secondary | ICD-10-CM | POA: Diagnosis not present

## 2022-04-02 DIAGNOSIS — L298 Other pruritus: Secondary | ICD-10-CM | POA: Diagnosis not present

## 2022-04-02 DIAGNOSIS — Z992 Dependence on renal dialysis: Secondary | ICD-10-CM | POA: Diagnosis not present

## 2022-04-02 DIAGNOSIS — L299 Pruritus, unspecified: Secondary | ICD-10-CM | POA: Diagnosis not present

## 2022-04-04 DIAGNOSIS — L298 Other pruritus: Secondary | ICD-10-CM | POA: Diagnosis not present

## 2022-04-04 DIAGNOSIS — N186 End stage renal disease: Secondary | ICD-10-CM | POA: Diagnosis not present

## 2022-04-04 DIAGNOSIS — N2581 Secondary hyperparathyroidism of renal origin: Secondary | ICD-10-CM | POA: Diagnosis not present

## 2022-04-04 DIAGNOSIS — Z992 Dependence on renal dialysis: Secondary | ICD-10-CM | POA: Diagnosis not present

## 2022-04-04 DIAGNOSIS — D689 Coagulation defect, unspecified: Secondary | ICD-10-CM | POA: Diagnosis not present

## 2022-04-04 DIAGNOSIS — L299 Pruritus, unspecified: Secondary | ICD-10-CM | POA: Diagnosis not present

## 2022-04-07 DIAGNOSIS — D689 Coagulation defect, unspecified: Secondary | ICD-10-CM | POA: Diagnosis not present

## 2022-04-07 DIAGNOSIS — N2581 Secondary hyperparathyroidism of renal origin: Secondary | ICD-10-CM | POA: Diagnosis not present

## 2022-04-07 DIAGNOSIS — Z992 Dependence on renal dialysis: Secondary | ICD-10-CM | POA: Diagnosis not present

## 2022-04-07 DIAGNOSIS — N186 End stage renal disease: Secondary | ICD-10-CM | POA: Diagnosis not present

## 2022-04-07 DIAGNOSIS — L299 Pruritus, unspecified: Secondary | ICD-10-CM | POA: Diagnosis not present

## 2022-04-07 DIAGNOSIS — L298 Other pruritus: Secondary | ICD-10-CM | POA: Diagnosis not present

## 2022-04-09 ENCOUNTER — Other Ambulatory Visit: Payer: Self-pay | Admitting: *Deleted

## 2022-04-09 DIAGNOSIS — Z992 Dependence on renal dialysis: Secondary | ICD-10-CM | POA: Diagnosis not present

## 2022-04-09 DIAGNOSIS — D689 Coagulation defect, unspecified: Secondary | ICD-10-CM | POA: Diagnosis not present

## 2022-04-09 DIAGNOSIS — N186 End stage renal disease: Secondary | ICD-10-CM

## 2022-04-09 DIAGNOSIS — L298 Other pruritus: Secondary | ICD-10-CM | POA: Diagnosis not present

## 2022-04-09 DIAGNOSIS — M79603 Pain in arm, unspecified: Secondary | ICD-10-CM

## 2022-04-09 DIAGNOSIS — L299 Pruritus, unspecified: Secondary | ICD-10-CM | POA: Diagnosis not present

## 2022-04-09 DIAGNOSIS — N2581 Secondary hyperparathyroidism of renal origin: Secondary | ICD-10-CM | POA: Diagnosis not present

## 2022-04-10 DIAGNOSIS — M792 Neuralgia and neuritis, unspecified: Secondary | ICD-10-CM | POA: Diagnosis not present

## 2022-04-11 DIAGNOSIS — L298 Other pruritus: Secondary | ICD-10-CM | POA: Diagnosis not present

## 2022-04-11 DIAGNOSIS — D689 Coagulation defect, unspecified: Secondary | ICD-10-CM | POA: Diagnosis not present

## 2022-04-11 DIAGNOSIS — Z992 Dependence on renal dialysis: Secondary | ICD-10-CM | POA: Diagnosis not present

## 2022-04-11 DIAGNOSIS — N2581 Secondary hyperparathyroidism of renal origin: Secondary | ICD-10-CM | POA: Diagnosis not present

## 2022-04-11 DIAGNOSIS — N186 End stage renal disease: Secondary | ICD-10-CM | POA: Diagnosis not present

## 2022-04-11 DIAGNOSIS — L299 Pruritus, unspecified: Secondary | ICD-10-CM | POA: Diagnosis not present

## 2022-04-14 DIAGNOSIS — Z992 Dependence on renal dialysis: Secondary | ICD-10-CM | POA: Diagnosis not present

## 2022-04-14 DIAGNOSIS — N186 End stage renal disease: Secondary | ICD-10-CM | POA: Diagnosis not present

## 2022-04-14 DIAGNOSIS — L299 Pruritus, unspecified: Secondary | ICD-10-CM | POA: Diagnosis not present

## 2022-04-14 DIAGNOSIS — L298 Other pruritus: Secondary | ICD-10-CM | POA: Diagnosis not present

## 2022-04-14 DIAGNOSIS — D689 Coagulation defect, unspecified: Secondary | ICD-10-CM | POA: Diagnosis not present

## 2022-04-14 DIAGNOSIS — N2581 Secondary hyperparathyroidism of renal origin: Secondary | ICD-10-CM | POA: Diagnosis not present

## 2022-04-15 ENCOUNTER — Ambulatory Visit (INDEPENDENT_AMBULATORY_CARE_PROVIDER_SITE_OTHER): Payer: Medicare HMO | Admitting: Physician Assistant

## 2022-04-15 ENCOUNTER — Ambulatory Visit (HOSPITAL_COMMUNITY)
Admission: RE | Admit: 2022-04-15 | Discharge: 2022-04-15 | Disposition: A | Discharge status: Home / Self Care | Payer: Medicare HMO | Source: Ambulatory Visit | Attending: Vascular Surgery | Admitting: Vascular Surgery

## 2022-04-15 ENCOUNTER — Encounter: Payer: Self-pay | Admitting: Physician Assistant

## 2022-04-15 VITALS — BP 171/92 | HR 87 | Temp 98.2°F | Resp 20 | Ht 66.0 in | Wt 184.9 lb

## 2022-04-15 DIAGNOSIS — M79601 Pain in right arm: Secondary | ICD-10-CM

## 2022-04-15 DIAGNOSIS — N186 End stage renal disease: Secondary | ICD-10-CM

## 2022-04-15 DIAGNOSIS — M79603 Pain in arm, unspecified: Secondary | ICD-10-CM | POA: Diagnosis not present

## 2022-04-15 DIAGNOSIS — Z992 Dependence on renal dialysis: Secondary | ICD-10-CM | POA: Insufficient documentation

## 2022-04-15 DIAGNOSIS — M79602 Pain in left arm: Secondary | ICD-10-CM | POA: Diagnosis not present

## 2022-04-15 DIAGNOSIS — T82898S Other specified complication of vascular prosthetic devices, implants and grafts, sequela: Secondary | ICD-10-CM

## 2022-04-15 NOTE — Progress Notes (Signed)
Established Dialysis Access   History of Present Illness   [name] [name] is a 67 y.o. (1955/12/04) female who presents for evaluation at the request of Dr. Joylene Grapes due to pain around her AV Fistula. She has a left arm brachiobasilic AV fistula. She was last seen in October of 2023 and at that time was having some steal symptoms however was not interested in any invasive intervention at the time. Today she explains that she does still have intermittent finger numbness in her left hand but this is tolerable. Over the last several months though she says she has been having pain in both of her arms mostly in the forearm, elbow and muscles of the arm. An aching pain that is worse with bending her elbows or trying to pick things up. This happens more so when she is not at dialysis. She says she has tried Tylenol Arthritis but this has not helped. She denies any neck or back issues or pain. She does not have any arm weakness and no tissue loss. She otherwise feels the fistula is working well. She does have aneurysm but says this has not changed in size. She dialyzes on MWF at the [facility] in [place].  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  Current Outpatient Medications  Medication Sig Dispense Refill   acetaminophen (TYLENOL) 650 MG CR tablet Take 650-1,300 mg by mouth every 8 (eight) hours as needed for pain.      albuterol (VENTOLIN HFA) 108 (90 Base) MCG/ACT inhaler INHALE 2 PUFFS INTO THE LUNGS EVERY 4 HOURS AS NEEDED FOR WHEEZE OR FOR SHORTNESS OF BREATH 18 each 3   amLODipine (NORVASC) 10 MG tablet TAKE 1 TABLET BY MOUTH EVERY DAY 90 tablet 0   atorvastatin (LIPITOR) 40 MG tablet Take 1 tablet (40 mg total) by mouth daily. Please make yearly appt with Dr. Acie Fredrickson for April 2022 for future refills. Thank you 1st attempt 90 tablet 0   blood glucose  meter kit and supplies KIT Dispense based on patient and insurance preference. Patient should test sugars twice daily as directed. Dx. E11.9 1 each 0   DULoxetine (CYMBALTA) 20 MG capsule TAKE 1 CAPSULE BY MOUTH EVERY DAY 90 capsule 0   hydrALAZINE (APRESOLINE) 100 MG tablet TAKE 1 TABLET BY MOUTH TWICE A DAY 180 tablet 0   HYDROcodone-acetaminophen (NORCO/VICODIN) 5-325 MG tablet Take 1 tablet by mouth every 6 (six) hours as needed for moderate pain. 30 tablet 0   isosorbide dinitrate (ISORDIL) 20 MG tablet TAKE 1 TABLET BY MOUTH THREE TIMES A DAY 270 tablet 0   losartan (COZAAR) 50 MG tablet TAKE 1 TABLET BY MOUTH EVERY DAY 90 tablet 1   multivitamin (RENA-VIT) TABS tablet Take 1 tablet by mouth every morning.   11   omeprazole (PRILOSEC) 40 MG capsule TAKE 1 CAPSULE BY MOUTH EVERY DAY 90 capsule 1   ONETOUCH ULTRA test strip USE AS INSTRUCTED TO TEST SUGARS TWICE DAILY. DX. E11.9 100 strip 12   pregabalin (LYRICA) 100 MG capsule Take 100 mg by mouth 3 (three) times daily.     QUEtiapine (SEROQUEL) 100 MG tablet TAKE 1 TABLET BY MOUTH EVERYDAY AT BEDTIME 90 tablet 1   traZODone (DESYREL) 50 MG tablet Take 50 mg by mouth at bedtime as needed for sleep.      No current facility-administered medications for  this visit.    On ROS today: negative unless stated in HPI   Physical Examination   Vitals:   04/15/22 0833  BP: (!) 171/92  Pulse: 87  Resp: 20  Temp: 98.2 F (36.8 C)  SpO2: 90%  Weight: 184 lb 14.4 oz (83.9 kg)  Height: '5\' 6"'$  (1.676 m)   Body mass index is 29.84 kg/m.  General Well appearing, well nourished, in no distress  Pulmonary Non labored  Cardiac Regular rate and rhythm  Vascular Vessel Right Left  Radial Palpable Palpable  Brachial Palpable Palpable  Ulnar Not palpable Not palpable    Musculo- skeletal M/S 5/5 throughout  , Extremities without ischemic changes. Left upper arm fistula is aneurysmal throughout mid fistula, some hypopigmentation but skin is  easily mobile overlying. No ulceration.    Neurologic A&O; CN grossly intact     Non-invasive Vascular Imaging   VAS US Duplex Dialysis Access: Summary:  Arteriovenous fistula-Velocities less than 100cm/s noted. Arteriovenous fistula-Aneurysmal dilatation noted, 2.64 x 0.28 cm). Arteriovenous fistula-Stenosis noted.   Medical Decision Making   [name] [name] is a 67 y.o. female who presents with concerns of arm pain. Her pain is bilateral and does not seem to be associated with her fistula itself. He pain is not typical for steal symptoms. I suspect this is more musculoskeletal in nature. Her duplex today shows patient AVF with adequate volume flow. She does have aneurysmal area. Her current steal symptoms of numbness remains tolerable. Recommend she follow up with PCP for further evaluation of her arm pain. She can follow up with [place] as needed if she has new or worsening symptoms.   Karoline Caldwell, PA-C [facility] [place] Office: [phone]  Clinic MD: Carlis Abbott

## 2022-04-16 DIAGNOSIS — L298 Other pruritus: Secondary | ICD-10-CM | POA: Diagnosis not present

## 2022-04-16 DIAGNOSIS — L299 Pruritus, unspecified: Secondary | ICD-10-CM | POA: Diagnosis not present

## 2022-04-16 DIAGNOSIS — Z992 Dependence on renal dialysis: Secondary | ICD-10-CM | POA: Diagnosis not present

## 2022-04-16 DIAGNOSIS — N2581 Secondary hyperparathyroidism of renal origin: Secondary | ICD-10-CM | POA: Diagnosis not present

## 2022-04-16 DIAGNOSIS — N186 End stage renal disease: Secondary | ICD-10-CM | POA: Diagnosis not present

## 2022-04-16 DIAGNOSIS — D689 Coagulation defect, unspecified: Secondary | ICD-10-CM | POA: Diagnosis not present

## 2022-04-18 DIAGNOSIS — L298 Other pruritus: Secondary | ICD-10-CM | POA: Diagnosis not present

## 2022-04-18 DIAGNOSIS — D689 Coagulation defect, unspecified: Secondary | ICD-10-CM | POA: Diagnosis not present

## 2022-04-18 DIAGNOSIS — Z992 Dependence on renal dialysis: Secondary | ICD-10-CM | POA: Diagnosis not present

## 2022-04-18 DIAGNOSIS — N186 End stage renal disease: Secondary | ICD-10-CM | POA: Diagnosis not present

## 2022-04-18 DIAGNOSIS — N2581 Secondary hyperparathyroidism of renal origin: Secondary | ICD-10-CM | POA: Diagnosis not present

## 2022-04-18 DIAGNOSIS — L299 Pruritus, unspecified: Secondary | ICD-10-CM | POA: Diagnosis not present

## 2022-04-21 DIAGNOSIS — Z992 Dependence on renal dialysis: Secondary | ICD-10-CM | POA: Diagnosis not present

## 2022-04-21 DIAGNOSIS — D689 Coagulation defect, unspecified: Secondary | ICD-10-CM | POA: Diagnosis not present

## 2022-04-21 DIAGNOSIS — L298 Other pruritus: Secondary | ICD-10-CM | POA: Diagnosis not present

## 2022-04-21 DIAGNOSIS — N2581 Secondary hyperparathyroidism of renal origin: Secondary | ICD-10-CM | POA: Diagnosis not present

## 2022-04-21 DIAGNOSIS — N186 End stage renal disease: Secondary | ICD-10-CM | POA: Diagnosis not present

## 2022-04-21 DIAGNOSIS — L299 Pruritus, unspecified: Secondary | ICD-10-CM | POA: Diagnosis not present

## 2022-04-23 DIAGNOSIS — I150 Renovascular hypertension: Secondary | ICD-10-CM | POA: Diagnosis not present

## 2022-04-23 DIAGNOSIS — Z992 Dependence on renal dialysis: Secondary | ICD-10-CM | POA: Diagnosis not present

## 2022-04-23 DIAGNOSIS — N186 End stage renal disease: Secondary | ICD-10-CM | POA: Diagnosis not present

## 2022-04-24 DIAGNOSIS — Z992 Dependence on renal dialysis: Secondary | ICD-10-CM | POA: Diagnosis not present

## 2022-04-24 DIAGNOSIS — N2581 Secondary hyperparathyroidism of renal origin: Secondary | ICD-10-CM | POA: Diagnosis not present

## 2022-04-24 DIAGNOSIS — D689 Coagulation defect, unspecified: Secondary | ICD-10-CM | POA: Diagnosis not present

## 2022-04-24 DIAGNOSIS — E1129 Type 2 diabetes mellitus with other diabetic kidney complication: Secondary | ICD-10-CM | POA: Diagnosis not present

## 2022-04-24 DIAGNOSIS — R0602 Shortness of breath: Secondary | ICD-10-CM | POA: Diagnosis not present

## 2022-04-24 DIAGNOSIS — L299 Pruritus, unspecified: Secondary | ICD-10-CM | POA: Diagnosis not present

## 2022-04-24 DIAGNOSIS — L298 Other pruritus: Secondary | ICD-10-CM | POA: Diagnosis not present

## 2022-04-24 DIAGNOSIS — N186 End stage renal disease: Secondary | ICD-10-CM | POA: Diagnosis not present

## 2022-04-24 DIAGNOSIS — D631 Anemia in chronic kidney disease: Secondary | ICD-10-CM | POA: Diagnosis not present

## 2022-04-25 DIAGNOSIS — Z992 Dependence on renal dialysis: Secondary | ICD-10-CM | POA: Diagnosis not present

## 2022-04-25 DIAGNOSIS — D689 Coagulation defect, unspecified: Secondary | ICD-10-CM | POA: Diagnosis not present

## 2022-04-25 DIAGNOSIS — L299 Pruritus, unspecified: Secondary | ICD-10-CM | POA: Diagnosis not present

## 2022-04-25 DIAGNOSIS — D631 Anemia in chronic kidney disease: Secondary | ICD-10-CM | POA: Diagnosis not present

## 2022-04-25 DIAGNOSIS — E1129 Type 2 diabetes mellitus with other diabetic kidney complication: Secondary | ICD-10-CM | POA: Diagnosis not present

## 2022-04-25 DIAGNOSIS — N2581 Secondary hyperparathyroidism of renal origin: Secondary | ICD-10-CM | POA: Diagnosis not present

## 2022-04-25 DIAGNOSIS — N186 End stage renal disease: Secondary | ICD-10-CM | POA: Diagnosis not present

## 2022-04-25 DIAGNOSIS — L298 Other pruritus: Secondary | ICD-10-CM | POA: Diagnosis not present

## 2022-04-28 DIAGNOSIS — N186 End stage renal disease: Secondary | ICD-10-CM | POA: Diagnosis not present

## 2022-04-28 DIAGNOSIS — Z992 Dependence on renal dialysis: Secondary | ICD-10-CM | POA: Diagnosis not present

## 2022-04-28 DIAGNOSIS — L298 Other pruritus: Secondary | ICD-10-CM | POA: Diagnosis not present

## 2022-04-28 DIAGNOSIS — D689 Coagulation defect, unspecified: Secondary | ICD-10-CM | POA: Diagnosis not present

## 2022-04-28 DIAGNOSIS — L299 Pruritus, unspecified: Secondary | ICD-10-CM | POA: Diagnosis not present

## 2022-04-28 DIAGNOSIS — D631 Anemia in chronic kidney disease: Secondary | ICD-10-CM | POA: Diagnosis not present

## 2022-04-28 DIAGNOSIS — N2581 Secondary hyperparathyroidism of renal origin: Secondary | ICD-10-CM | POA: Diagnosis not present

## 2022-04-28 DIAGNOSIS — E1129 Type 2 diabetes mellitus with other diabetic kidney complication: Secondary | ICD-10-CM | POA: Diagnosis not present

## 2022-04-29 ENCOUNTER — Other Ambulatory Visit: Payer: Self-pay | Admitting: Family Medicine

## 2022-04-30 DIAGNOSIS — L298 Other pruritus: Secondary | ICD-10-CM | POA: Diagnosis not present

## 2022-04-30 DIAGNOSIS — D631 Anemia in chronic kidney disease: Secondary | ICD-10-CM | POA: Diagnosis not present

## 2022-04-30 DIAGNOSIS — Z992 Dependence on renal dialysis: Secondary | ICD-10-CM | POA: Diagnosis not present

## 2022-04-30 DIAGNOSIS — N186 End stage renal disease: Secondary | ICD-10-CM | POA: Diagnosis not present

## 2022-04-30 DIAGNOSIS — N2581 Secondary hyperparathyroidism of renal origin: Secondary | ICD-10-CM | POA: Diagnosis not present

## 2022-04-30 DIAGNOSIS — E1129 Type 2 diabetes mellitus with other diabetic kidney complication: Secondary | ICD-10-CM | POA: Diagnosis not present

## 2022-04-30 DIAGNOSIS — L299 Pruritus, unspecified: Secondary | ICD-10-CM | POA: Diagnosis not present

## 2022-04-30 DIAGNOSIS — D689 Coagulation defect, unspecified: Secondary | ICD-10-CM | POA: Diagnosis not present

## 2022-05-02 DIAGNOSIS — L298 Other pruritus: Secondary | ICD-10-CM | POA: Diagnosis not present

## 2022-05-02 DIAGNOSIS — E1129 Type 2 diabetes mellitus with other diabetic kidney complication: Secondary | ICD-10-CM | POA: Diagnosis not present

## 2022-05-02 DIAGNOSIS — L299 Pruritus, unspecified: Secondary | ICD-10-CM | POA: Diagnosis not present

## 2022-05-02 DIAGNOSIS — N2581 Secondary hyperparathyroidism of renal origin: Secondary | ICD-10-CM | POA: Diagnosis not present

## 2022-05-02 DIAGNOSIS — D631 Anemia in chronic kidney disease: Secondary | ICD-10-CM | POA: Diagnosis not present

## 2022-05-02 DIAGNOSIS — D689 Coagulation defect, unspecified: Secondary | ICD-10-CM | POA: Diagnosis not present

## 2022-05-02 DIAGNOSIS — Z992 Dependence on renal dialysis: Secondary | ICD-10-CM | POA: Diagnosis not present

## 2022-05-02 DIAGNOSIS — N186 End stage renal disease: Secondary | ICD-10-CM | POA: Diagnosis not present

## 2022-05-05 DIAGNOSIS — N186 End stage renal disease: Secondary | ICD-10-CM | POA: Diagnosis not present

## 2022-05-05 DIAGNOSIS — D689 Coagulation defect, unspecified: Secondary | ICD-10-CM | POA: Diagnosis not present

## 2022-05-05 DIAGNOSIS — N2581 Secondary hyperparathyroidism of renal origin: Secondary | ICD-10-CM | POA: Diagnosis not present

## 2022-05-05 DIAGNOSIS — D631 Anemia in chronic kidney disease: Secondary | ICD-10-CM | POA: Diagnosis not present

## 2022-05-05 DIAGNOSIS — L299 Pruritus, unspecified: Secondary | ICD-10-CM | POA: Diagnosis not present

## 2022-05-05 DIAGNOSIS — L298 Other pruritus: Secondary | ICD-10-CM | POA: Diagnosis not present

## 2022-05-05 DIAGNOSIS — Z992 Dependence on renal dialysis: Secondary | ICD-10-CM | POA: Diagnosis not present

## 2022-05-05 DIAGNOSIS — E1129 Type 2 diabetes mellitus with other diabetic kidney complication: Secondary | ICD-10-CM | POA: Diagnosis not present

## 2022-05-06 ENCOUNTER — Encounter: Payer: Medicare HMO | Admitting: Family Medicine

## 2022-05-07 DIAGNOSIS — Z992 Dependence on renal dialysis: Secondary | ICD-10-CM | POA: Diagnosis not present

## 2022-05-07 DIAGNOSIS — N2581 Secondary hyperparathyroidism of renal origin: Secondary | ICD-10-CM | POA: Diagnosis not present

## 2022-05-07 DIAGNOSIS — L298 Other pruritus: Secondary | ICD-10-CM | POA: Diagnosis not present

## 2022-05-07 DIAGNOSIS — E1129 Type 2 diabetes mellitus with other diabetic kidney complication: Secondary | ICD-10-CM | POA: Diagnosis not present

## 2022-05-07 DIAGNOSIS — D631 Anemia in chronic kidney disease: Secondary | ICD-10-CM | POA: Diagnosis not present

## 2022-05-07 DIAGNOSIS — L299 Pruritus, unspecified: Secondary | ICD-10-CM | POA: Diagnosis not present

## 2022-05-07 DIAGNOSIS — D689 Coagulation defect, unspecified: Secondary | ICD-10-CM | POA: Diagnosis not present

## 2022-05-07 DIAGNOSIS — N186 End stage renal disease: Secondary | ICD-10-CM | POA: Diagnosis not present

## 2022-05-09 DIAGNOSIS — L298 Other pruritus: Secondary | ICD-10-CM | POA: Diagnosis not present

## 2022-05-09 DIAGNOSIS — N2581 Secondary hyperparathyroidism of renal origin: Secondary | ICD-10-CM | POA: Diagnosis not present

## 2022-05-09 DIAGNOSIS — D689 Coagulation defect, unspecified: Secondary | ICD-10-CM | POA: Diagnosis not present

## 2022-05-09 DIAGNOSIS — D631 Anemia in chronic kidney disease: Secondary | ICD-10-CM | POA: Diagnosis not present

## 2022-05-09 DIAGNOSIS — N186 End stage renal disease: Secondary | ICD-10-CM | POA: Diagnosis not present

## 2022-05-09 DIAGNOSIS — Z992 Dependence on renal dialysis: Secondary | ICD-10-CM | POA: Diagnosis not present

## 2022-05-09 DIAGNOSIS — E1129 Type 2 diabetes mellitus with other diabetic kidney complication: Secondary | ICD-10-CM | POA: Diagnosis not present

## 2022-05-09 DIAGNOSIS — L299 Pruritus, unspecified: Secondary | ICD-10-CM | POA: Diagnosis not present

## 2022-05-12 ENCOUNTER — Other Ambulatory Visit: Payer: Self-pay | Admitting: Family Medicine

## 2022-05-12 DIAGNOSIS — E1129 Type 2 diabetes mellitus with other diabetic kidney complication: Secondary | ICD-10-CM | POA: Diagnosis not present

## 2022-05-12 DIAGNOSIS — L298 Other pruritus: Secondary | ICD-10-CM | POA: Diagnosis not present

## 2022-05-12 DIAGNOSIS — L299 Pruritus, unspecified: Secondary | ICD-10-CM | POA: Diagnosis not present

## 2022-05-12 DIAGNOSIS — D689 Coagulation defect, unspecified: Secondary | ICD-10-CM | POA: Diagnosis not present

## 2022-05-12 DIAGNOSIS — N186 End stage renal disease: Secondary | ICD-10-CM | POA: Diagnosis not present

## 2022-05-12 DIAGNOSIS — N2581 Secondary hyperparathyroidism of renal origin: Secondary | ICD-10-CM | POA: Diagnosis not present

## 2022-05-12 DIAGNOSIS — D631 Anemia in chronic kidney disease: Secondary | ICD-10-CM | POA: Diagnosis not present

## 2022-05-12 DIAGNOSIS — Z992 Dependence on renal dialysis: Secondary | ICD-10-CM | POA: Diagnosis not present

## 2022-05-14 DIAGNOSIS — N186 End stage renal disease: Secondary | ICD-10-CM | POA: Diagnosis not present

## 2022-05-14 DIAGNOSIS — L299 Pruritus, unspecified: Secondary | ICD-10-CM | POA: Diagnosis not present

## 2022-05-14 DIAGNOSIS — D689 Coagulation defect, unspecified: Secondary | ICD-10-CM | POA: Diagnosis not present

## 2022-05-14 DIAGNOSIS — Z992 Dependence on renal dialysis: Secondary | ICD-10-CM | POA: Diagnosis not present

## 2022-05-14 DIAGNOSIS — L298 Other pruritus: Secondary | ICD-10-CM | POA: Diagnosis not present

## 2022-05-14 DIAGNOSIS — D631 Anemia in chronic kidney disease: Secondary | ICD-10-CM | POA: Diagnosis not present

## 2022-05-14 DIAGNOSIS — N2581 Secondary hyperparathyroidism of renal origin: Secondary | ICD-10-CM | POA: Diagnosis not present

## 2022-05-14 DIAGNOSIS — E1129 Type 2 diabetes mellitus with other diabetic kidney complication: Secondary | ICD-10-CM | POA: Diagnosis not present

## 2022-05-16 DIAGNOSIS — D689 Coagulation defect, unspecified: Secondary | ICD-10-CM | POA: Diagnosis not present

## 2022-05-16 DIAGNOSIS — E1129 Type 2 diabetes mellitus with other diabetic kidney complication: Secondary | ICD-10-CM | POA: Diagnosis not present

## 2022-05-16 DIAGNOSIS — L298 Other pruritus: Secondary | ICD-10-CM | POA: Diagnosis not present

## 2022-05-16 DIAGNOSIS — L299 Pruritus, unspecified: Secondary | ICD-10-CM | POA: Diagnosis not present

## 2022-05-16 DIAGNOSIS — N2581 Secondary hyperparathyroidism of renal origin: Secondary | ICD-10-CM | POA: Diagnosis not present

## 2022-05-16 DIAGNOSIS — D631 Anemia in chronic kidney disease: Secondary | ICD-10-CM | POA: Diagnosis not present

## 2022-05-16 DIAGNOSIS — N186 End stage renal disease: Secondary | ICD-10-CM | POA: Diagnosis not present

## 2022-05-16 DIAGNOSIS — Z992 Dependence on renal dialysis: Secondary | ICD-10-CM | POA: Diagnosis not present

## 2022-05-19 DIAGNOSIS — N186 End stage renal disease: Secondary | ICD-10-CM | POA: Diagnosis not present

## 2022-05-19 DIAGNOSIS — L299 Pruritus, unspecified: Secondary | ICD-10-CM | POA: Diagnosis not present

## 2022-05-19 DIAGNOSIS — D631 Anemia in chronic kidney disease: Secondary | ICD-10-CM | POA: Diagnosis not present

## 2022-05-19 DIAGNOSIS — D689 Coagulation defect, unspecified: Secondary | ICD-10-CM | POA: Diagnosis not present

## 2022-05-19 DIAGNOSIS — E1129 Type 2 diabetes mellitus with other diabetic kidney complication: Secondary | ICD-10-CM | POA: Diagnosis not present

## 2022-05-19 DIAGNOSIS — Z992 Dependence on renal dialysis: Secondary | ICD-10-CM | POA: Diagnosis not present

## 2022-05-19 DIAGNOSIS — N2581 Secondary hyperparathyroidism of renal origin: Secondary | ICD-10-CM | POA: Diagnosis not present

## 2022-05-19 DIAGNOSIS — L298 Other pruritus: Secondary | ICD-10-CM | POA: Diagnosis not present

## 2022-05-21 DIAGNOSIS — Z992 Dependence on renal dialysis: Secondary | ICD-10-CM | POA: Diagnosis not present

## 2022-05-21 DIAGNOSIS — E1129 Type 2 diabetes mellitus with other diabetic kidney complication: Secondary | ICD-10-CM | POA: Diagnosis not present

## 2022-05-21 DIAGNOSIS — D689 Coagulation defect, unspecified: Secondary | ICD-10-CM | POA: Diagnosis not present

## 2022-05-21 DIAGNOSIS — L298 Other pruritus: Secondary | ICD-10-CM | POA: Diagnosis not present

## 2022-05-21 DIAGNOSIS — L299 Pruritus, unspecified: Secondary | ICD-10-CM | POA: Diagnosis not present

## 2022-05-21 DIAGNOSIS — N186 End stage renal disease: Secondary | ICD-10-CM | POA: Diagnosis not present

## 2022-05-21 DIAGNOSIS — N2581 Secondary hyperparathyroidism of renal origin: Secondary | ICD-10-CM | POA: Diagnosis not present

## 2022-05-21 DIAGNOSIS — D631 Anemia in chronic kidney disease: Secondary | ICD-10-CM | POA: Diagnosis not present

## 2022-05-22 DIAGNOSIS — I150 Renovascular hypertension: Secondary | ICD-10-CM | POA: Diagnosis not present

## 2022-05-22 DIAGNOSIS — N186 End stage renal disease: Secondary | ICD-10-CM | POA: Diagnosis not present

## 2022-05-22 DIAGNOSIS — Z992 Dependence on renal dialysis: Secondary | ICD-10-CM | POA: Diagnosis not present

## 2022-05-23 DIAGNOSIS — D689 Coagulation defect, unspecified: Secondary | ICD-10-CM | POA: Diagnosis not present

## 2022-05-23 DIAGNOSIS — Z992 Dependence on renal dialysis: Secondary | ICD-10-CM | POA: Diagnosis not present

## 2022-05-23 DIAGNOSIS — L299 Pruritus, unspecified: Secondary | ICD-10-CM | POA: Diagnosis not present

## 2022-05-23 DIAGNOSIS — N2581 Secondary hyperparathyroidism of renal origin: Secondary | ICD-10-CM | POA: Diagnosis not present

## 2022-05-23 DIAGNOSIS — D631 Anemia in chronic kidney disease: Secondary | ICD-10-CM | POA: Diagnosis not present

## 2022-05-23 DIAGNOSIS — R0602 Shortness of breath: Secondary | ICD-10-CM | POA: Diagnosis not present

## 2022-05-23 DIAGNOSIS — E1129 Type 2 diabetes mellitus with other diabetic kidney complication: Secondary | ICD-10-CM | POA: Diagnosis not present

## 2022-05-23 DIAGNOSIS — D509 Iron deficiency anemia, unspecified: Secondary | ICD-10-CM | POA: Diagnosis not present

## 2022-05-23 DIAGNOSIS — N186 End stage renal disease: Secondary | ICD-10-CM | POA: Diagnosis not present

## 2022-05-26 DIAGNOSIS — E1129 Type 2 diabetes mellitus with other diabetic kidney complication: Secondary | ICD-10-CM | POA: Diagnosis not present

## 2022-05-26 DIAGNOSIS — N2581 Secondary hyperparathyroidism of renal origin: Secondary | ICD-10-CM | POA: Diagnosis not present

## 2022-05-26 DIAGNOSIS — D689 Coagulation defect, unspecified: Secondary | ICD-10-CM | POA: Diagnosis not present

## 2022-05-26 DIAGNOSIS — Z992 Dependence on renal dialysis: Secondary | ICD-10-CM | POA: Diagnosis not present

## 2022-05-26 DIAGNOSIS — D509 Iron deficiency anemia, unspecified: Secondary | ICD-10-CM | POA: Diagnosis not present

## 2022-05-26 DIAGNOSIS — L299 Pruritus, unspecified: Secondary | ICD-10-CM | POA: Diagnosis not present

## 2022-05-26 DIAGNOSIS — D631 Anemia in chronic kidney disease: Secondary | ICD-10-CM | POA: Diagnosis not present

## 2022-05-26 DIAGNOSIS — N186 End stage renal disease: Secondary | ICD-10-CM | POA: Diagnosis not present

## 2022-05-28 DIAGNOSIS — D631 Anemia in chronic kidney disease: Secondary | ICD-10-CM | POA: Diagnosis not present

## 2022-05-28 DIAGNOSIS — Z992 Dependence on renal dialysis: Secondary | ICD-10-CM | POA: Diagnosis not present

## 2022-05-28 DIAGNOSIS — D509 Iron deficiency anemia, unspecified: Secondary | ICD-10-CM | POA: Diagnosis not present

## 2022-05-28 DIAGNOSIS — N186 End stage renal disease: Secondary | ICD-10-CM | POA: Diagnosis not present

## 2022-05-28 DIAGNOSIS — L299 Pruritus, unspecified: Secondary | ICD-10-CM | POA: Diagnosis not present

## 2022-05-28 DIAGNOSIS — E1129 Type 2 diabetes mellitus with other diabetic kidney complication: Secondary | ICD-10-CM | POA: Diagnosis not present

## 2022-05-28 DIAGNOSIS — N2581 Secondary hyperparathyroidism of renal origin: Secondary | ICD-10-CM | POA: Diagnosis not present

## 2022-05-28 DIAGNOSIS — D689 Coagulation defect, unspecified: Secondary | ICD-10-CM | POA: Diagnosis not present

## 2022-05-30 DIAGNOSIS — N186 End stage renal disease: Secondary | ICD-10-CM | POA: Diagnosis not present

## 2022-05-30 DIAGNOSIS — D509 Iron deficiency anemia, unspecified: Secondary | ICD-10-CM | POA: Diagnosis not present

## 2022-05-30 DIAGNOSIS — L299 Pruritus, unspecified: Secondary | ICD-10-CM | POA: Diagnosis not present

## 2022-05-30 DIAGNOSIS — E1129 Type 2 diabetes mellitus with other diabetic kidney complication: Secondary | ICD-10-CM | POA: Diagnosis not present

## 2022-05-30 DIAGNOSIS — N2581 Secondary hyperparathyroidism of renal origin: Secondary | ICD-10-CM | POA: Diagnosis not present

## 2022-05-30 DIAGNOSIS — D631 Anemia in chronic kidney disease: Secondary | ICD-10-CM | POA: Diagnosis not present

## 2022-05-30 DIAGNOSIS — Z992 Dependence on renal dialysis: Secondary | ICD-10-CM | POA: Diagnosis not present

## 2022-05-30 DIAGNOSIS — D689 Coagulation defect, unspecified: Secondary | ICD-10-CM | POA: Diagnosis not present

## 2022-06-02 DIAGNOSIS — D631 Anemia in chronic kidney disease: Secondary | ICD-10-CM | POA: Diagnosis not present

## 2022-06-02 DIAGNOSIS — D689 Coagulation defect, unspecified: Secondary | ICD-10-CM | POA: Diagnosis not present

## 2022-06-02 DIAGNOSIS — Z992 Dependence on renal dialysis: Secondary | ICD-10-CM | POA: Diagnosis not present

## 2022-06-02 DIAGNOSIS — D509 Iron deficiency anemia, unspecified: Secondary | ICD-10-CM | POA: Diagnosis not present

## 2022-06-02 DIAGNOSIS — N2581 Secondary hyperparathyroidism of renal origin: Secondary | ICD-10-CM | POA: Diagnosis not present

## 2022-06-02 DIAGNOSIS — E1129 Type 2 diabetes mellitus with other diabetic kidney complication: Secondary | ICD-10-CM | POA: Diagnosis not present

## 2022-06-02 DIAGNOSIS — N186 End stage renal disease: Secondary | ICD-10-CM | POA: Diagnosis not present

## 2022-06-02 DIAGNOSIS — L299 Pruritus, unspecified: Secondary | ICD-10-CM | POA: Diagnosis not present

## 2022-06-04 DIAGNOSIS — D689 Coagulation defect, unspecified: Secondary | ICD-10-CM | POA: Diagnosis not present

## 2022-06-04 DIAGNOSIS — D631 Anemia in chronic kidney disease: Secondary | ICD-10-CM | POA: Diagnosis not present

## 2022-06-04 DIAGNOSIS — D509 Iron deficiency anemia, unspecified: Secondary | ICD-10-CM | POA: Diagnosis not present

## 2022-06-04 DIAGNOSIS — N186 End stage renal disease: Secondary | ICD-10-CM | POA: Diagnosis not present

## 2022-06-04 DIAGNOSIS — N2581 Secondary hyperparathyroidism of renal origin: Secondary | ICD-10-CM | POA: Diagnosis not present

## 2022-06-04 DIAGNOSIS — E1129 Type 2 diabetes mellitus with other diabetic kidney complication: Secondary | ICD-10-CM | POA: Diagnosis not present

## 2022-06-04 DIAGNOSIS — Z992 Dependence on renal dialysis: Secondary | ICD-10-CM | POA: Diagnosis not present

## 2022-06-04 DIAGNOSIS — L299 Pruritus, unspecified: Secondary | ICD-10-CM | POA: Diagnosis not present

## 2022-06-06 DIAGNOSIS — L299 Pruritus, unspecified: Secondary | ICD-10-CM | POA: Diagnosis not present

## 2022-06-06 DIAGNOSIS — E1129 Type 2 diabetes mellitus with other diabetic kidney complication: Secondary | ICD-10-CM | POA: Diagnosis not present

## 2022-06-06 DIAGNOSIS — N2581 Secondary hyperparathyroidism of renal origin: Secondary | ICD-10-CM | POA: Diagnosis not present

## 2022-06-06 DIAGNOSIS — N186 End stage renal disease: Secondary | ICD-10-CM | POA: Diagnosis not present

## 2022-06-06 DIAGNOSIS — Z992 Dependence on renal dialysis: Secondary | ICD-10-CM | POA: Diagnosis not present

## 2022-06-06 DIAGNOSIS — D689 Coagulation defect, unspecified: Secondary | ICD-10-CM | POA: Diagnosis not present

## 2022-06-06 DIAGNOSIS — D509 Iron deficiency anemia, unspecified: Secondary | ICD-10-CM | POA: Diagnosis not present

## 2022-06-06 DIAGNOSIS — D631 Anemia in chronic kidney disease: Secondary | ICD-10-CM | POA: Diagnosis not present

## 2022-06-09 DIAGNOSIS — Z992 Dependence on renal dialysis: Secondary | ICD-10-CM | POA: Diagnosis not present

## 2022-06-09 DIAGNOSIS — N2581 Secondary hyperparathyroidism of renal origin: Secondary | ICD-10-CM | POA: Diagnosis not present

## 2022-06-09 DIAGNOSIS — N186 End stage renal disease: Secondary | ICD-10-CM | POA: Diagnosis not present

## 2022-06-09 DIAGNOSIS — L299 Pruritus, unspecified: Secondary | ICD-10-CM | POA: Diagnosis not present

## 2022-06-09 DIAGNOSIS — D689 Coagulation defect, unspecified: Secondary | ICD-10-CM | POA: Diagnosis not present

## 2022-06-09 DIAGNOSIS — D509 Iron deficiency anemia, unspecified: Secondary | ICD-10-CM | POA: Diagnosis not present

## 2022-06-09 DIAGNOSIS — D631 Anemia in chronic kidney disease: Secondary | ICD-10-CM | POA: Diagnosis not present

## 2022-06-09 DIAGNOSIS — E1129 Type 2 diabetes mellitus with other diabetic kidney complication: Secondary | ICD-10-CM | POA: Diagnosis not present

## 2022-06-11 ENCOUNTER — Other Ambulatory Visit: Payer: Self-pay

## 2022-06-11 DIAGNOSIS — N2581 Secondary hyperparathyroidism of renal origin: Secondary | ICD-10-CM | POA: Diagnosis not present

## 2022-06-11 DIAGNOSIS — D631 Anemia in chronic kidney disease: Secondary | ICD-10-CM | POA: Diagnosis not present

## 2022-06-11 DIAGNOSIS — Z992 Dependence on renal dialysis: Secondary | ICD-10-CM | POA: Diagnosis not present

## 2022-06-11 DIAGNOSIS — D689 Coagulation defect, unspecified: Secondary | ICD-10-CM | POA: Diagnosis not present

## 2022-06-11 DIAGNOSIS — E1129 Type 2 diabetes mellitus with other diabetic kidney complication: Secondary | ICD-10-CM | POA: Diagnosis not present

## 2022-06-11 DIAGNOSIS — D509 Iron deficiency anemia, unspecified: Secondary | ICD-10-CM | POA: Diagnosis not present

## 2022-06-11 DIAGNOSIS — L299 Pruritus, unspecified: Secondary | ICD-10-CM | POA: Diagnosis not present

## 2022-06-11 DIAGNOSIS — N186 End stage renal disease: Secondary | ICD-10-CM | POA: Diagnosis not present

## 2022-06-11 DIAGNOSIS — Z79899 Other long term (current) drug therapy: Secondary | ICD-10-CM

## 2022-06-13 DIAGNOSIS — L299 Pruritus, unspecified: Secondary | ICD-10-CM | POA: Diagnosis not present

## 2022-06-13 DIAGNOSIS — D509 Iron deficiency anemia, unspecified: Secondary | ICD-10-CM | POA: Diagnosis not present

## 2022-06-13 DIAGNOSIS — E1129 Type 2 diabetes mellitus with other diabetic kidney complication: Secondary | ICD-10-CM | POA: Diagnosis not present

## 2022-06-13 DIAGNOSIS — Z992 Dependence on renal dialysis: Secondary | ICD-10-CM | POA: Diagnosis not present

## 2022-06-13 DIAGNOSIS — D689 Coagulation defect, unspecified: Secondary | ICD-10-CM | POA: Diagnosis not present

## 2022-06-13 DIAGNOSIS — N2581 Secondary hyperparathyroidism of renal origin: Secondary | ICD-10-CM | POA: Diagnosis not present

## 2022-06-13 DIAGNOSIS — N186 End stage renal disease: Secondary | ICD-10-CM | POA: Diagnosis not present

## 2022-06-13 DIAGNOSIS — D631 Anemia in chronic kidney disease: Secondary | ICD-10-CM | POA: Diagnosis not present

## 2022-06-16 DIAGNOSIS — D631 Anemia in chronic kidney disease: Secondary | ICD-10-CM | POA: Diagnosis not present

## 2022-06-16 DIAGNOSIS — N186 End stage renal disease: Secondary | ICD-10-CM | POA: Diagnosis not present

## 2022-06-16 DIAGNOSIS — D509 Iron deficiency anemia, unspecified: Secondary | ICD-10-CM | POA: Diagnosis not present

## 2022-06-16 DIAGNOSIS — Z992 Dependence on renal dialysis: Secondary | ICD-10-CM | POA: Diagnosis not present

## 2022-06-16 DIAGNOSIS — N2581 Secondary hyperparathyroidism of renal origin: Secondary | ICD-10-CM | POA: Diagnosis not present

## 2022-06-16 DIAGNOSIS — L299 Pruritus, unspecified: Secondary | ICD-10-CM | POA: Diagnosis not present

## 2022-06-16 DIAGNOSIS — E1129 Type 2 diabetes mellitus with other diabetic kidney complication: Secondary | ICD-10-CM | POA: Diagnosis not present

## 2022-06-16 DIAGNOSIS — D689 Coagulation defect, unspecified: Secondary | ICD-10-CM | POA: Diagnosis not present

## 2022-06-18 DIAGNOSIS — D509 Iron deficiency anemia, unspecified: Secondary | ICD-10-CM | POA: Diagnosis not present

## 2022-06-18 DIAGNOSIS — D689 Coagulation defect, unspecified: Secondary | ICD-10-CM | POA: Diagnosis not present

## 2022-06-18 DIAGNOSIS — N2581 Secondary hyperparathyroidism of renal origin: Secondary | ICD-10-CM | POA: Diagnosis not present

## 2022-06-18 DIAGNOSIS — Z992 Dependence on renal dialysis: Secondary | ICD-10-CM | POA: Diagnosis not present

## 2022-06-18 DIAGNOSIS — D631 Anemia in chronic kidney disease: Secondary | ICD-10-CM | POA: Diagnosis not present

## 2022-06-18 DIAGNOSIS — L299 Pruritus, unspecified: Secondary | ICD-10-CM | POA: Diagnosis not present

## 2022-06-18 DIAGNOSIS — N186 End stage renal disease: Secondary | ICD-10-CM | POA: Diagnosis not present

## 2022-06-18 DIAGNOSIS — E1129 Type 2 diabetes mellitus with other diabetic kidney complication: Secondary | ICD-10-CM | POA: Diagnosis not present

## 2022-06-20 DIAGNOSIS — D689 Coagulation defect, unspecified: Secondary | ICD-10-CM | POA: Diagnosis not present

## 2022-06-20 DIAGNOSIS — L299 Pruritus, unspecified: Secondary | ICD-10-CM | POA: Diagnosis not present

## 2022-06-20 DIAGNOSIS — Z992 Dependence on renal dialysis: Secondary | ICD-10-CM | POA: Diagnosis not present

## 2022-06-20 DIAGNOSIS — D509 Iron deficiency anemia, unspecified: Secondary | ICD-10-CM | POA: Diagnosis not present

## 2022-06-20 DIAGNOSIS — E1129 Type 2 diabetes mellitus with other diabetic kidney complication: Secondary | ICD-10-CM | POA: Diagnosis not present

## 2022-06-20 DIAGNOSIS — N2581 Secondary hyperparathyroidism of renal origin: Secondary | ICD-10-CM | POA: Diagnosis not present

## 2022-06-20 DIAGNOSIS — N186 End stage renal disease: Secondary | ICD-10-CM | POA: Diagnosis not present

## 2022-06-20 DIAGNOSIS — D631 Anemia in chronic kidney disease: Secondary | ICD-10-CM | POA: Diagnosis not present

## 2022-06-22 DIAGNOSIS — I150 Renovascular hypertension: Secondary | ICD-10-CM | POA: Diagnosis not present

## 2022-06-22 DIAGNOSIS — Z992 Dependence on renal dialysis: Secondary | ICD-10-CM | POA: Diagnosis not present

## 2022-06-22 DIAGNOSIS — N186 End stage renal disease: Secondary | ICD-10-CM | POA: Diagnosis not present

## 2022-06-23 DIAGNOSIS — D689 Coagulation defect, unspecified: Secondary | ICD-10-CM | POA: Diagnosis not present

## 2022-06-23 DIAGNOSIS — Z992 Dependence on renal dialysis: Secondary | ICD-10-CM | POA: Diagnosis not present

## 2022-06-23 DIAGNOSIS — Z23 Encounter for immunization: Secondary | ICD-10-CM | POA: Diagnosis not present

## 2022-06-23 DIAGNOSIS — N186 End stage renal disease: Secondary | ICD-10-CM | POA: Diagnosis not present

## 2022-06-23 DIAGNOSIS — D631 Anemia in chronic kidney disease: Secondary | ICD-10-CM | POA: Diagnosis not present

## 2022-06-23 DIAGNOSIS — L299 Pruritus, unspecified: Secondary | ICD-10-CM | POA: Diagnosis not present

## 2022-06-23 DIAGNOSIS — N2581 Secondary hyperparathyroidism of renal origin: Secondary | ICD-10-CM | POA: Diagnosis not present

## 2022-06-23 DIAGNOSIS — R0602 Shortness of breath: Secondary | ICD-10-CM | POA: Diagnosis not present

## 2022-06-25 DIAGNOSIS — D631 Anemia in chronic kidney disease: Secondary | ICD-10-CM | POA: Diagnosis not present

## 2022-06-25 DIAGNOSIS — N2581 Secondary hyperparathyroidism of renal origin: Secondary | ICD-10-CM | POA: Diagnosis not present

## 2022-06-25 DIAGNOSIS — Z23 Encounter for immunization: Secondary | ICD-10-CM | POA: Diagnosis not present

## 2022-06-25 DIAGNOSIS — L299 Pruritus, unspecified: Secondary | ICD-10-CM | POA: Diagnosis not present

## 2022-06-25 DIAGNOSIS — D689 Coagulation defect, unspecified: Secondary | ICD-10-CM | POA: Diagnosis not present

## 2022-06-25 DIAGNOSIS — N186 End stage renal disease: Secondary | ICD-10-CM | POA: Diagnosis not present

## 2022-06-25 DIAGNOSIS — Z992 Dependence on renal dialysis: Secondary | ICD-10-CM | POA: Diagnosis not present

## 2022-06-27 DIAGNOSIS — Z992 Dependence on renal dialysis: Secondary | ICD-10-CM | POA: Diagnosis not present

## 2022-06-27 DIAGNOSIS — L299 Pruritus, unspecified: Secondary | ICD-10-CM | POA: Diagnosis not present

## 2022-06-27 DIAGNOSIS — D631 Anemia in chronic kidney disease: Secondary | ICD-10-CM | POA: Diagnosis not present

## 2022-06-27 DIAGNOSIS — Z23 Encounter for immunization: Secondary | ICD-10-CM | POA: Diagnosis not present

## 2022-06-27 DIAGNOSIS — N2581 Secondary hyperparathyroidism of renal origin: Secondary | ICD-10-CM | POA: Diagnosis not present

## 2022-06-27 DIAGNOSIS — N186 End stage renal disease: Secondary | ICD-10-CM | POA: Diagnosis not present

## 2022-06-27 DIAGNOSIS — D689 Coagulation defect, unspecified: Secondary | ICD-10-CM | POA: Diagnosis not present

## 2022-06-30 DIAGNOSIS — D689 Coagulation defect, unspecified: Secondary | ICD-10-CM | POA: Diagnosis not present

## 2022-06-30 DIAGNOSIS — N186 End stage renal disease: Secondary | ICD-10-CM | POA: Diagnosis not present

## 2022-06-30 DIAGNOSIS — N2581 Secondary hyperparathyroidism of renal origin: Secondary | ICD-10-CM | POA: Diagnosis not present

## 2022-06-30 DIAGNOSIS — Z992 Dependence on renal dialysis: Secondary | ICD-10-CM | POA: Diagnosis not present

## 2022-06-30 DIAGNOSIS — D631 Anemia in chronic kidney disease: Secondary | ICD-10-CM | POA: Diagnosis not present

## 2022-06-30 DIAGNOSIS — Z23 Encounter for immunization: Secondary | ICD-10-CM | POA: Diagnosis not present

## 2022-06-30 DIAGNOSIS — L299 Pruritus, unspecified: Secondary | ICD-10-CM | POA: Diagnosis not present

## 2022-07-02 DIAGNOSIS — Z992 Dependence on renal dialysis: Secondary | ICD-10-CM | POA: Diagnosis not present

## 2022-07-02 DIAGNOSIS — D631 Anemia in chronic kidney disease: Secondary | ICD-10-CM | POA: Diagnosis not present

## 2022-07-02 DIAGNOSIS — D689 Coagulation defect, unspecified: Secondary | ICD-10-CM | POA: Diagnosis not present

## 2022-07-02 DIAGNOSIS — N186 End stage renal disease: Secondary | ICD-10-CM | POA: Diagnosis not present

## 2022-07-02 DIAGNOSIS — N2581 Secondary hyperparathyroidism of renal origin: Secondary | ICD-10-CM | POA: Diagnosis not present

## 2022-07-02 DIAGNOSIS — L299 Pruritus, unspecified: Secondary | ICD-10-CM | POA: Diagnosis not present

## 2022-07-02 DIAGNOSIS — Z23 Encounter for immunization: Secondary | ICD-10-CM | POA: Diagnosis not present

## 2022-07-04 DIAGNOSIS — N2581 Secondary hyperparathyroidism of renal origin: Secondary | ICD-10-CM | POA: Diagnosis not present

## 2022-07-04 DIAGNOSIS — N186 End stage renal disease: Secondary | ICD-10-CM | POA: Diagnosis not present

## 2022-07-04 DIAGNOSIS — D631 Anemia in chronic kidney disease: Secondary | ICD-10-CM | POA: Diagnosis not present

## 2022-07-04 DIAGNOSIS — Z992 Dependence on renal dialysis: Secondary | ICD-10-CM | POA: Diagnosis not present

## 2022-07-04 DIAGNOSIS — L299 Pruritus, unspecified: Secondary | ICD-10-CM | POA: Diagnosis not present

## 2022-07-04 DIAGNOSIS — Z23 Encounter for immunization: Secondary | ICD-10-CM | POA: Diagnosis not present

## 2022-07-04 DIAGNOSIS — D689 Coagulation defect, unspecified: Secondary | ICD-10-CM | POA: Diagnosis not present

## 2022-07-07 DIAGNOSIS — L299 Pruritus, unspecified: Secondary | ICD-10-CM | POA: Diagnosis not present

## 2022-07-07 DIAGNOSIS — N186 End stage renal disease: Secondary | ICD-10-CM | POA: Diagnosis not present

## 2022-07-07 DIAGNOSIS — Z992 Dependence on renal dialysis: Secondary | ICD-10-CM | POA: Diagnosis not present

## 2022-07-07 DIAGNOSIS — D689 Coagulation defect, unspecified: Secondary | ICD-10-CM | POA: Diagnosis not present

## 2022-07-07 DIAGNOSIS — Z23 Encounter for immunization: Secondary | ICD-10-CM | POA: Diagnosis not present

## 2022-07-07 DIAGNOSIS — N2581 Secondary hyperparathyroidism of renal origin: Secondary | ICD-10-CM | POA: Diagnosis not present

## 2022-07-07 DIAGNOSIS — D631 Anemia in chronic kidney disease: Secondary | ICD-10-CM | POA: Diagnosis not present

## 2022-07-09 DIAGNOSIS — D631 Anemia in chronic kidney disease: Secondary | ICD-10-CM | POA: Diagnosis not present

## 2022-07-09 DIAGNOSIS — Z23 Encounter for immunization: Secondary | ICD-10-CM | POA: Diagnosis not present

## 2022-07-09 DIAGNOSIS — L299 Pruritus, unspecified: Secondary | ICD-10-CM | POA: Diagnosis not present

## 2022-07-09 DIAGNOSIS — N2581 Secondary hyperparathyroidism of renal origin: Secondary | ICD-10-CM | POA: Diagnosis not present

## 2022-07-09 DIAGNOSIS — Z992 Dependence on renal dialysis: Secondary | ICD-10-CM | POA: Diagnosis not present

## 2022-07-09 DIAGNOSIS — N186 End stage renal disease: Secondary | ICD-10-CM | POA: Diagnosis not present

## 2022-07-09 DIAGNOSIS — D689 Coagulation defect, unspecified: Secondary | ICD-10-CM | POA: Diagnosis not present

## 2022-07-11 DIAGNOSIS — L299 Pruritus, unspecified: Secondary | ICD-10-CM | POA: Diagnosis not present

## 2022-07-11 DIAGNOSIS — N2581 Secondary hyperparathyroidism of renal origin: Secondary | ICD-10-CM | POA: Diagnosis not present

## 2022-07-11 DIAGNOSIS — Z23 Encounter for immunization: Secondary | ICD-10-CM | POA: Diagnosis not present

## 2022-07-11 DIAGNOSIS — D631 Anemia in chronic kidney disease: Secondary | ICD-10-CM | POA: Diagnosis not present

## 2022-07-11 DIAGNOSIS — D689 Coagulation defect, unspecified: Secondary | ICD-10-CM | POA: Diagnosis not present

## 2022-07-11 DIAGNOSIS — Z992 Dependence on renal dialysis: Secondary | ICD-10-CM | POA: Diagnosis not present

## 2022-07-11 DIAGNOSIS — N186 End stage renal disease: Secondary | ICD-10-CM | POA: Diagnosis not present

## 2022-07-14 DIAGNOSIS — L299 Pruritus, unspecified: Secondary | ICD-10-CM | POA: Diagnosis not present

## 2022-07-14 DIAGNOSIS — D689 Coagulation defect, unspecified: Secondary | ICD-10-CM | POA: Diagnosis not present

## 2022-07-14 DIAGNOSIS — Z992 Dependence on renal dialysis: Secondary | ICD-10-CM | POA: Diagnosis not present

## 2022-07-14 DIAGNOSIS — N186 End stage renal disease: Secondary | ICD-10-CM | POA: Diagnosis not present

## 2022-07-14 DIAGNOSIS — N2581 Secondary hyperparathyroidism of renal origin: Secondary | ICD-10-CM | POA: Diagnosis not present

## 2022-07-14 DIAGNOSIS — D631 Anemia in chronic kidney disease: Secondary | ICD-10-CM | POA: Diagnosis not present

## 2022-07-14 DIAGNOSIS — Z23 Encounter for immunization: Secondary | ICD-10-CM | POA: Diagnosis not present

## 2022-07-16 DIAGNOSIS — N186 End stage renal disease: Secondary | ICD-10-CM | POA: Diagnosis not present

## 2022-07-16 DIAGNOSIS — N2581 Secondary hyperparathyroidism of renal origin: Secondary | ICD-10-CM | POA: Diagnosis not present

## 2022-07-16 DIAGNOSIS — D631 Anemia in chronic kidney disease: Secondary | ICD-10-CM | POA: Diagnosis not present

## 2022-07-16 DIAGNOSIS — D689 Coagulation defect, unspecified: Secondary | ICD-10-CM | POA: Diagnosis not present

## 2022-07-16 DIAGNOSIS — Z23 Encounter for immunization: Secondary | ICD-10-CM | POA: Diagnosis not present

## 2022-07-16 DIAGNOSIS — L299 Pruritus, unspecified: Secondary | ICD-10-CM | POA: Diagnosis not present

## 2022-07-16 DIAGNOSIS — Z992 Dependence on renal dialysis: Secondary | ICD-10-CM | POA: Diagnosis not present

## 2022-07-18 DIAGNOSIS — Z992 Dependence on renal dialysis: Secondary | ICD-10-CM | POA: Diagnosis not present

## 2022-07-18 DIAGNOSIS — Z23 Encounter for immunization: Secondary | ICD-10-CM | POA: Diagnosis not present

## 2022-07-18 DIAGNOSIS — N186 End stage renal disease: Secondary | ICD-10-CM | POA: Diagnosis not present

## 2022-07-18 DIAGNOSIS — L299 Pruritus, unspecified: Secondary | ICD-10-CM | POA: Diagnosis not present

## 2022-07-18 DIAGNOSIS — D689 Coagulation defect, unspecified: Secondary | ICD-10-CM | POA: Diagnosis not present

## 2022-07-18 DIAGNOSIS — D631 Anemia in chronic kidney disease: Secondary | ICD-10-CM | POA: Diagnosis not present

## 2022-07-18 DIAGNOSIS — N2581 Secondary hyperparathyroidism of renal origin: Secondary | ICD-10-CM | POA: Diagnosis not present

## 2022-07-21 DIAGNOSIS — N186 End stage renal disease: Secondary | ICD-10-CM | POA: Diagnosis not present

## 2022-07-21 DIAGNOSIS — D631 Anemia in chronic kidney disease: Secondary | ICD-10-CM | POA: Diagnosis not present

## 2022-07-21 DIAGNOSIS — D689 Coagulation defect, unspecified: Secondary | ICD-10-CM | POA: Diagnosis not present

## 2022-07-21 DIAGNOSIS — L299 Pruritus, unspecified: Secondary | ICD-10-CM | POA: Diagnosis not present

## 2022-07-21 DIAGNOSIS — Z23 Encounter for immunization: Secondary | ICD-10-CM | POA: Diagnosis not present

## 2022-07-21 DIAGNOSIS — Z992 Dependence on renal dialysis: Secondary | ICD-10-CM | POA: Diagnosis not present

## 2022-07-21 DIAGNOSIS — N2581 Secondary hyperparathyroidism of renal origin: Secondary | ICD-10-CM | POA: Diagnosis not present

## 2022-07-22 DIAGNOSIS — I150 Renovascular hypertension: Secondary | ICD-10-CM | POA: Diagnosis not present

## 2022-07-22 DIAGNOSIS — N186 End stage renal disease: Secondary | ICD-10-CM | POA: Diagnosis not present

## 2022-07-22 DIAGNOSIS — Z992 Dependence on renal dialysis: Secondary | ICD-10-CM | POA: Diagnosis not present

## 2022-07-23 DIAGNOSIS — E1129 Type 2 diabetes mellitus with other diabetic kidney complication: Secondary | ICD-10-CM | POA: Diagnosis not present

## 2022-07-23 DIAGNOSIS — L299 Pruritus, unspecified: Secondary | ICD-10-CM | POA: Diagnosis not present

## 2022-07-23 DIAGNOSIS — Z992 Dependence on renal dialysis: Secondary | ICD-10-CM | POA: Diagnosis not present

## 2022-07-23 DIAGNOSIS — N2581 Secondary hyperparathyroidism of renal origin: Secondary | ICD-10-CM | POA: Diagnosis not present

## 2022-07-23 DIAGNOSIS — N186 End stage renal disease: Secondary | ICD-10-CM | POA: Diagnosis not present

## 2022-07-23 DIAGNOSIS — D631 Anemia in chronic kidney disease: Secondary | ICD-10-CM | POA: Diagnosis not present

## 2022-07-23 DIAGNOSIS — D689 Coagulation defect, unspecified: Secondary | ICD-10-CM | POA: Diagnosis not present

## 2022-07-23 DIAGNOSIS — R0602 Shortness of breath: Secondary | ICD-10-CM | POA: Diagnosis not present

## 2022-07-25 DIAGNOSIS — D631 Anemia in chronic kidney disease: Secondary | ICD-10-CM | POA: Diagnosis not present

## 2022-07-25 DIAGNOSIS — E1129 Type 2 diabetes mellitus with other diabetic kidney complication: Secondary | ICD-10-CM | POA: Diagnosis not present

## 2022-07-25 DIAGNOSIS — N186 End stage renal disease: Secondary | ICD-10-CM | POA: Diagnosis not present

## 2022-07-25 DIAGNOSIS — N2581 Secondary hyperparathyroidism of renal origin: Secondary | ICD-10-CM | POA: Diagnosis not present

## 2022-07-25 DIAGNOSIS — L299 Pruritus, unspecified: Secondary | ICD-10-CM | POA: Diagnosis not present

## 2022-07-25 DIAGNOSIS — Z992 Dependence on renal dialysis: Secondary | ICD-10-CM | POA: Diagnosis not present

## 2022-07-25 DIAGNOSIS — D689 Coagulation defect, unspecified: Secondary | ICD-10-CM | POA: Diagnosis not present

## 2022-07-28 DIAGNOSIS — Z992 Dependence on renal dialysis: Secondary | ICD-10-CM | POA: Diagnosis not present

## 2022-07-28 DIAGNOSIS — E1129 Type 2 diabetes mellitus with other diabetic kidney complication: Secondary | ICD-10-CM | POA: Diagnosis not present

## 2022-07-28 DIAGNOSIS — N186 End stage renal disease: Secondary | ICD-10-CM | POA: Diagnosis not present

## 2022-07-28 DIAGNOSIS — L299 Pruritus, unspecified: Secondary | ICD-10-CM | POA: Diagnosis not present

## 2022-07-28 DIAGNOSIS — D689 Coagulation defect, unspecified: Secondary | ICD-10-CM | POA: Diagnosis not present

## 2022-07-28 DIAGNOSIS — N2581 Secondary hyperparathyroidism of renal origin: Secondary | ICD-10-CM | POA: Diagnosis not present

## 2022-07-28 DIAGNOSIS — D631 Anemia in chronic kidney disease: Secondary | ICD-10-CM | POA: Diagnosis not present

## 2022-07-30 DIAGNOSIS — L299 Pruritus, unspecified: Secondary | ICD-10-CM | POA: Diagnosis not present

## 2022-07-30 DIAGNOSIS — D631 Anemia in chronic kidney disease: Secondary | ICD-10-CM | POA: Diagnosis not present

## 2022-07-30 DIAGNOSIS — N2581 Secondary hyperparathyroidism of renal origin: Secondary | ICD-10-CM | POA: Diagnosis not present

## 2022-07-30 DIAGNOSIS — D689 Coagulation defect, unspecified: Secondary | ICD-10-CM | POA: Diagnosis not present

## 2022-07-30 DIAGNOSIS — N186 End stage renal disease: Secondary | ICD-10-CM | POA: Diagnosis not present

## 2022-07-30 DIAGNOSIS — E1129 Type 2 diabetes mellitus with other diabetic kidney complication: Secondary | ICD-10-CM | POA: Diagnosis not present

## 2022-07-30 DIAGNOSIS — Z992 Dependence on renal dialysis: Secondary | ICD-10-CM | POA: Diagnosis not present

## 2022-08-01 DIAGNOSIS — D689 Coagulation defect, unspecified: Secondary | ICD-10-CM | POA: Diagnosis not present

## 2022-08-01 DIAGNOSIS — E1129 Type 2 diabetes mellitus with other diabetic kidney complication: Secondary | ICD-10-CM | POA: Diagnosis not present

## 2022-08-01 DIAGNOSIS — D631 Anemia in chronic kidney disease: Secondary | ICD-10-CM | POA: Diagnosis not present

## 2022-08-01 DIAGNOSIS — Z992 Dependence on renal dialysis: Secondary | ICD-10-CM | POA: Diagnosis not present

## 2022-08-01 DIAGNOSIS — L299 Pruritus, unspecified: Secondary | ICD-10-CM | POA: Diagnosis not present

## 2022-08-01 DIAGNOSIS — N2581 Secondary hyperparathyroidism of renal origin: Secondary | ICD-10-CM | POA: Diagnosis not present

## 2022-08-01 DIAGNOSIS — N186 End stage renal disease: Secondary | ICD-10-CM | POA: Diagnosis not present

## 2022-08-04 DIAGNOSIS — Z992 Dependence on renal dialysis: Secondary | ICD-10-CM | POA: Diagnosis not present

## 2022-08-04 DIAGNOSIS — E1129 Type 2 diabetes mellitus with other diabetic kidney complication: Secondary | ICD-10-CM | POA: Diagnosis not present

## 2022-08-04 DIAGNOSIS — D631 Anemia in chronic kidney disease: Secondary | ICD-10-CM | POA: Diagnosis not present

## 2022-08-04 DIAGNOSIS — L299 Pruritus, unspecified: Secondary | ICD-10-CM | POA: Diagnosis not present

## 2022-08-04 DIAGNOSIS — N2581 Secondary hyperparathyroidism of renal origin: Secondary | ICD-10-CM | POA: Diagnosis not present

## 2022-08-04 DIAGNOSIS — N186 End stage renal disease: Secondary | ICD-10-CM | POA: Diagnosis not present

## 2022-08-04 DIAGNOSIS — D689 Coagulation defect, unspecified: Secondary | ICD-10-CM | POA: Diagnosis not present

## 2022-08-06 DIAGNOSIS — D689 Coagulation defect, unspecified: Secondary | ICD-10-CM | POA: Diagnosis not present

## 2022-08-06 DIAGNOSIS — E1129 Type 2 diabetes mellitus with other diabetic kidney complication: Secondary | ICD-10-CM | POA: Diagnosis not present

## 2022-08-06 DIAGNOSIS — D631 Anemia in chronic kidney disease: Secondary | ICD-10-CM | POA: Diagnosis not present

## 2022-08-06 DIAGNOSIS — Z992 Dependence on renal dialysis: Secondary | ICD-10-CM | POA: Diagnosis not present

## 2022-08-06 DIAGNOSIS — N2581 Secondary hyperparathyroidism of renal origin: Secondary | ICD-10-CM | POA: Diagnosis not present

## 2022-08-06 DIAGNOSIS — L299 Pruritus, unspecified: Secondary | ICD-10-CM | POA: Diagnosis not present

## 2022-08-06 DIAGNOSIS — N186 End stage renal disease: Secondary | ICD-10-CM | POA: Diagnosis not present

## 2022-08-08 DIAGNOSIS — D631 Anemia in chronic kidney disease: Secondary | ICD-10-CM | POA: Diagnosis not present

## 2022-08-08 DIAGNOSIS — D689 Coagulation defect, unspecified: Secondary | ICD-10-CM | POA: Diagnosis not present

## 2022-08-08 DIAGNOSIS — N186 End stage renal disease: Secondary | ICD-10-CM | POA: Diagnosis not present

## 2022-08-08 DIAGNOSIS — E1129 Type 2 diabetes mellitus with other diabetic kidney complication: Secondary | ICD-10-CM | POA: Diagnosis not present

## 2022-08-08 DIAGNOSIS — L299 Pruritus, unspecified: Secondary | ICD-10-CM | POA: Diagnosis not present

## 2022-08-08 DIAGNOSIS — N2581 Secondary hyperparathyroidism of renal origin: Secondary | ICD-10-CM | POA: Diagnosis not present

## 2022-08-08 DIAGNOSIS — Z992 Dependence on renal dialysis: Secondary | ICD-10-CM | POA: Diagnosis not present

## 2022-08-11 DIAGNOSIS — N186 End stage renal disease: Secondary | ICD-10-CM | POA: Diagnosis not present

## 2022-08-11 DIAGNOSIS — E1129 Type 2 diabetes mellitus with other diabetic kidney complication: Secondary | ICD-10-CM | POA: Diagnosis not present

## 2022-08-11 DIAGNOSIS — Z992 Dependence on renal dialysis: Secondary | ICD-10-CM | POA: Diagnosis not present

## 2022-08-11 DIAGNOSIS — L299 Pruritus, unspecified: Secondary | ICD-10-CM | POA: Diagnosis not present

## 2022-08-11 DIAGNOSIS — D631 Anemia in chronic kidney disease: Secondary | ICD-10-CM | POA: Diagnosis not present

## 2022-08-11 DIAGNOSIS — D689 Coagulation defect, unspecified: Secondary | ICD-10-CM | POA: Diagnosis not present

## 2022-08-11 DIAGNOSIS — N2581 Secondary hyperparathyroidism of renal origin: Secondary | ICD-10-CM | POA: Diagnosis not present

## 2022-08-12 ENCOUNTER — Other Ambulatory Visit: Payer: Self-pay | Admitting: Family Medicine

## 2022-08-12 DIAGNOSIS — I10 Essential (primary) hypertension: Secondary | ICD-10-CM

## 2022-08-13 DIAGNOSIS — N186 End stage renal disease: Secondary | ICD-10-CM | POA: Diagnosis not present

## 2022-08-13 DIAGNOSIS — D689 Coagulation defect, unspecified: Secondary | ICD-10-CM | POA: Diagnosis not present

## 2022-08-13 DIAGNOSIS — N2581 Secondary hyperparathyroidism of renal origin: Secondary | ICD-10-CM | POA: Diagnosis not present

## 2022-08-13 DIAGNOSIS — D631 Anemia in chronic kidney disease: Secondary | ICD-10-CM | POA: Diagnosis not present

## 2022-08-13 DIAGNOSIS — Z992 Dependence on renal dialysis: Secondary | ICD-10-CM | POA: Diagnosis not present

## 2022-08-13 DIAGNOSIS — E1129 Type 2 diabetes mellitus with other diabetic kidney complication: Secondary | ICD-10-CM | POA: Diagnosis not present

## 2022-08-13 DIAGNOSIS — L299 Pruritus, unspecified: Secondary | ICD-10-CM | POA: Diagnosis not present

## 2022-08-15 DIAGNOSIS — N186 End stage renal disease: Secondary | ICD-10-CM | POA: Diagnosis not present

## 2022-08-15 DIAGNOSIS — Z992 Dependence on renal dialysis: Secondary | ICD-10-CM | POA: Diagnosis not present

## 2022-08-15 DIAGNOSIS — E1129 Type 2 diabetes mellitus with other diabetic kidney complication: Secondary | ICD-10-CM | POA: Diagnosis not present

## 2022-08-15 DIAGNOSIS — D689 Coagulation defect, unspecified: Secondary | ICD-10-CM | POA: Diagnosis not present

## 2022-08-15 DIAGNOSIS — N2581 Secondary hyperparathyroidism of renal origin: Secondary | ICD-10-CM | POA: Diagnosis not present

## 2022-08-15 DIAGNOSIS — L299 Pruritus, unspecified: Secondary | ICD-10-CM | POA: Diagnosis not present

## 2022-08-15 DIAGNOSIS — D631 Anemia in chronic kidney disease: Secondary | ICD-10-CM | POA: Diagnosis not present

## 2022-08-18 DIAGNOSIS — N2581 Secondary hyperparathyroidism of renal origin: Secondary | ICD-10-CM | POA: Diagnosis not present

## 2022-08-18 DIAGNOSIS — D631 Anemia in chronic kidney disease: Secondary | ICD-10-CM | POA: Diagnosis not present

## 2022-08-18 DIAGNOSIS — D689 Coagulation defect, unspecified: Secondary | ICD-10-CM | POA: Diagnosis not present

## 2022-08-18 DIAGNOSIS — N186 End stage renal disease: Secondary | ICD-10-CM | POA: Diagnosis not present

## 2022-08-18 DIAGNOSIS — Z992 Dependence on renal dialysis: Secondary | ICD-10-CM | POA: Diagnosis not present

## 2022-08-18 DIAGNOSIS — L299 Pruritus, unspecified: Secondary | ICD-10-CM | POA: Diagnosis not present

## 2022-08-18 DIAGNOSIS — E1129 Type 2 diabetes mellitus with other diabetic kidney complication: Secondary | ICD-10-CM | POA: Diagnosis not present

## 2022-08-20 DIAGNOSIS — D631 Anemia in chronic kidney disease: Secondary | ICD-10-CM | POA: Diagnosis not present

## 2022-08-20 DIAGNOSIS — D689 Coagulation defect, unspecified: Secondary | ICD-10-CM | POA: Diagnosis not present

## 2022-08-20 DIAGNOSIS — N186 End stage renal disease: Secondary | ICD-10-CM | POA: Diagnosis not present

## 2022-08-20 DIAGNOSIS — E1129 Type 2 diabetes mellitus with other diabetic kidney complication: Secondary | ICD-10-CM | POA: Diagnosis not present

## 2022-08-20 DIAGNOSIS — Z992 Dependence on renal dialysis: Secondary | ICD-10-CM | POA: Diagnosis not present

## 2022-08-20 DIAGNOSIS — N2581 Secondary hyperparathyroidism of renal origin: Secondary | ICD-10-CM | POA: Diagnosis not present

## 2022-08-20 DIAGNOSIS — L299 Pruritus, unspecified: Secondary | ICD-10-CM | POA: Diagnosis not present

## 2022-08-22 DIAGNOSIS — D631 Anemia in chronic kidney disease: Secondary | ICD-10-CM | POA: Diagnosis not present

## 2022-08-22 DIAGNOSIS — N2581 Secondary hyperparathyroidism of renal origin: Secondary | ICD-10-CM | POA: Diagnosis not present

## 2022-08-22 DIAGNOSIS — N186 End stage renal disease: Secondary | ICD-10-CM | POA: Diagnosis not present

## 2022-08-22 DIAGNOSIS — Z992 Dependence on renal dialysis: Secondary | ICD-10-CM | POA: Diagnosis not present

## 2022-08-22 DIAGNOSIS — D689 Coagulation defect, unspecified: Secondary | ICD-10-CM | POA: Diagnosis not present

## 2022-08-22 DIAGNOSIS — E1129 Type 2 diabetes mellitus with other diabetic kidney complication: Secondary | ICD-10-CM | POA: Diagnosis not present

## 2022-08-22 DIAGNOSIS — I150 Renovascular hypertension: Secondary | ICD-10-CM | POA: Diagnosis not present

## 2022-08-22 DIAGNOSIS — L299 Pruritus, unspecified: Secondary | ICD-10-CM | POA: Diagnosis not present

## 2022-08-23 DIAGNOSIS — R0602 Shortness of breath: Secondary | ICD-10-CM | POA: Diagnosis not present

## 2022-08-25 DIAGNOSIS — N186 End stage renal disease: Secondary | ICD-10-CM | POA: Diagnosis not present

## 2022-08-25 DIAGNOSIS — N2581 Secondary hyperparathyroidism of renal origin: Secondary | ICD-10-CM | POA: Diagnosis not present

## 2022-08-25 DIAGNOSIS — L299 Pruritus, unspecified: Secondary | ICD-10-CM | POA: Diagnosis not present

## 2022-08-25 DIAGNOSIS — Z992 Dependence on renal dialysis: Secondary | ICD-10-CM | POA: Diagnosis not present

## 2022-08-25 DIAGNOSIS — D689 Coagulation defect, unspecified: Secondary | ICD-10-CM | POA: Diagnosis not present

## 2022-08-25 DIAGNOSIS — D631 Anemia in chronic kidney disease: Secondary | ICD-10-CM | POA: Diagnosis not present

## 2022-08-27 DIAGNOSIS — L299 Pruritus, unspecified: Secondary | ICD-10-CM | POA: Diagnosis not present

## 2022-08-27 DIAGNOSIS — N186 End stage renal disease: Secondary | ICD-10-CM | POA: Diagnosis not present

## 2022-08-27 DIAGNOSIS — D631 Anemia in chronic kidney disease: Secondary | ICD-10-CM | POA: Diagnosis not present

## 2022-08-27 DIAGNOSIS — N2581 Secondary hyperparathyroidism of renal origin: Secondary | ICD-10-CM | POA: Diagnosis not present

## 2022-08-27 DIAGNOSIS — Z992 Dependence on renal dialysis: Secondary | ICD-10-CM | POA: Diagnosis not present

## 2022-08-27 DIAGNOSIS — D689 Coagulation defect, unspecified: Secondary | ICD-10-CM | POA: Diagnosis not present

## 2022-08-29 DIAGNOSIS — D631 Anemia in chronic kidney disease: Secondary | ICD-10-CM | POA: Diagnosis not present

## 2022-08-29 DIAGNOSIS — D689 Coagulation defect, unspecified: Secondary | ICD-10-CM | POA: Diagnosis not present

## 2022-08-29 DIAGNOSIS — Z992 Dependence on renal dialysis: Secondary | ICD-10-CM | POA: Diagnosis not present

## 2022-08-29 DIAGNOSIS — N186 End stage renal disease: Secondary | ICD-10-CM | POA: Diagnosis not present

## 2022-08-29 DIAGNOSIS — L299 Pruritus, unspecified: Secondary | ICD-10-CM | POA: Diagnosis not present

## 2022-08-29 DIAGNOSIS — N2581 Secondary hyperparathyroidism of renal origin: Secondary | ICD-10-CM | POA: Diagnosis not present

## 2022-09-01 DIAGNOSIS — D689 Coagulation defect, unspecified: Secondary | ICD-10-CM | POA: Diagnosis not present

## 2022-09-01 DIAGNOSIS — D631 Anemia in chronic kidney disease: Secondary | ICD-10-CM | POA: Diagnosis not present

## 2022-09-01 DIAGNOSIS — N186 End stage renal disease: Secondary | ICD-10-CM | POA: Diagnosis not present

## 2022-09-01 DIAGNOSIS — N2581 Secondary hyperparathyroidism of renal origin: Secondary | ICD-10-CM | POA: Diagnosis not present

## 2022-09-01 DIAGNOSIS — Z992 Dependence on renal dialysis: Secondary | ICD-10-CM | POA: Diagnosis not present

## 2022-09-01 DIAGNOSIS — L299 Pruritus, unspecified: Secondary | ICD-10-CM | POA: Diagnosis not present

## 2022-09-02 ENCOUNTER — Telehealth: Payer: Self-pay | Admitting: Family Medicine

## 2022-09-02 NOTE — Telephone Encounter (Signed)
Placed in Dr Tabori to be reviewed folder 

## 2022-09-02 NOTE — Telephone Encounter (Signed)
[facility] Health Information. Placed in front bin.

## 2022-09-03 ENCOUNTER — Telehealth: Payer: Self-pay | Admitting: Family Medicine

## 2022-09-03 DIAGNOSIS — Z992 Dependence on renal dialysis: Secondary | ICD-10-CM | POA: Diagnosis not present

## 2022-09-03 DIAGNOSIS — D689 Coagulation defect, unspecified: Secondary | ICD-10-CM | POA: Diagnosis not present

## 2022-09-03 DIAGNOSIS — D631 Anemia in chronic kidney disease: Secondary | ICD-10-CM | POA: Diagnosis not present

## 2022-09-03 DIAGNOSIS — L299 Pruritus, unspecified: Secondary | ICD-10-CM | POA: Diagnosis not present

## 2022-09-03 DIAGNOSIS — N186 End stage renal disease: Secondary | ICD-10-CM | POA: Diagnosis not present

## 2022-09-03 DIAGNOSIS — N2581 Secondary hyperparathyroidism of renal origin: Secondary | ICD-10-CM | POA: Diagnosis not present

## 2022-09-03 NOTE — Telephone Encounter (Signed)
Pt called and states she needs a smaller Oxygen tank due to the space at dialysis and the other is too big for her to carry out daily . Can we send in for a smaller tank ?

## 2022-09-03 NOTE — Telephone Encounter (Signed)
Caller name: TRINIDEE SCHRAG  On DPR?: Yes  Call back number: [phone] (mobile)  Provider they see: Sheliah Hatch, MD  Reason for call:   Pt would like a smaller Oxygen unit. After speaking to dialysis they advised to call her PCP.

## 2022-09-03 NOTE — Telephone Encounter (Signed)
Pt is months overdue for an appt.  I don't have record of who she gets her O2 through (DME supplier) or how much O2 she is using (how many liters).  She will need to schedule an appt and we can then determine next steps.

## 2022-09-03 NOTE — Telephone Encounter (Signed)
I spoke to pt and advised she needed an apt . She is coming on Tuesday 09/09/22

## 2022-09-05 DIAGNOSIS — N2581 Secondary hyperparathyroidism of renal origin: Secondary | ICD-10-CM | POA: Diagnosis not present

## 2022-09-05 DIAGNOSIS — Z992 Dependence on renal dialysis: Secondary | ICD-10-CM | POA: Diagnosis not present

## 2022-09-05 DIAGNOSIS — L299 Pruritus, unspecified: Secondary | ICD-10-CM | POA: Diagnosis not present

## 2022-09-05 DIAGNOSIS — D689 Coagulation defect, unspecified: Secondary | ICD-10-CM | POA: Diagnosis not present

## 2022-09-05 DIAGNOSIS — D631 Anemia in chronic kidney disease: Secondary | ICD-10-CM | POA: Diagnosis not present

## 2022-09-05 DIAGNOSIS — N186 End stage renal disease: Secondary | ICD-10-CM | POA: Diagnosis not present

## 2022-09-08 DIAGNOSIS — D689 Coagulation defect, unspecified: Secondary | ICD-10-CM | POA: Diagnosis not present

## 2022-09-08 DIAGNOSIS — L299 Pruritus, unspecified: Secondary | ICD-10-CM | POA: Diagnosis not present

## 2022-09-08 DIAGNOSIS — N2581 Secondary hyperparathyroidism of renal origin: Secondary | ICD-10-CM | POA: Diagnosis not present

## 2022-09-08 DIAGNOSIS — Z992 Dependence on renal dialysis: Secondary | ICD-10-CM | POA: Diagnosis not present

## 2022-09-08 DIAGNOSIS — R0602 Shortness of breath: Secondary | ICD-10-CM | POA: Diagnosis not present

## 2022-09-08 DIAGNOSIS — N186 End stage renal disease: Secondary | ICD-10-CM | POA: Diagnosis not present

## 2022-09-08 DIAGNOSIS — J9611 Chronic respiratory failure with hypoxia: Secondary | ICD-10-CM | POA: Diagnosis not present

## 2022-09-08 DIAGNOSIS — D631 Anemia in chronic kidney disease: Secondary | ICD-10-CM | POA: Diagnosis not present

## 2022-09-09 ENCOUNTER — Ambulatory Visit (INDEPENDENT_AMBULATORY_CARE_PROVIDER_SITE_OTHER): Payer: Medicare HMO | Admitting: Family Medicine

## 2022-09-09 ENCOUNTER — Telehealth: Payer: Self-pay | Admitting: Family Medicine

## 2022-09-09 ENCOUNTER — Encounter: Payer: Self-pay | Admitting: Family Medicine

## 2022-09-09 VITALS — BP 138/86 | HR 77 | Temp 97.9°F | Resp 18 | Ht 66.0 in | Wt 180.4 lb

## 2022-09-09 DIAGNOSIS — J9611 Chronic respiratory failure with hypoxia: Secondary | ICD-10-CM | POA: Diagnosis not present

## 2022-09-09 DIAGNOSIS — Z1211 Encounter for screening for malignant neoplasm of colon: Secondary | ICD-10-CM

## 2022-09-09 DIAGNOSIS — E1169 Type 2 diabetes mellitus with other specified complication: Secondary | ICD-10-CM | POA: Diagnosis not present

## 2022-09-09 DIAGNOSIS — Z1231 Encounter for screening mammogram for malignant neoplasm of breast: Secondary | ICD-10-CM | POA: Diagnosis not present

## 2022-09-09 DIAGNOSIS — E1122 Type 2 diabetes mellitus with diabetic chronic kidney disease: Secondary | ICD-10-CM

## 2022-09-09 DIAGNOSIS — E785 Hyperlipidemia, unspecified: Secondary | ICD-10-CM

## 2022-09-09 LAB — CBC WITH DIFFERENTIAL/PLATELET
Basophils Absolute: 0.1 10*3/uL (ref 0.0–0.1)
Basophils Relative: 0.7 % (ref 0.0–3.0)
Eosinophils Absolute: 0.4 10*3/uL (ref 0.0–0.7)
Eosinophils Relative: 3.8 % (ref 0.0–5.0)
HCT: 36.4 % (ref 36.0–46.0)
Hemoglobin: 11.7 g/dL — ABNORMAL LOW (ref 12.0–15.0)
Lymphocytes Relative: 22.2 % (ref 12.0–46.0)
Lymphs Abs: 2 10*3/uL (ref 0.7–4.0)
MCHC: 32.1 g/dL (ref 30.0–36.0)
MCV: 96.7 fl (ref 78.0–100.0)
Monocytes Absolute: 1 10*3/uL (ref 0.1–1.0)
Monocytes Relative: 11.2 % (ref 3.0–12.0)
Neutro Abs: 5.7 10*3/uL (ref 1.4–7.7)
Neutrophils Relative %: 62.1 % (ref 43.0–77.0)
Platelets: 166 10*3/uL (ref 150.0–400.0)
RBC: 3.76 Mil/uL — ABNORMAL LOW (ref 3.87–5.11)
RDW: 18.1 % — ABNORMAL HIGH (ref 11.5–15.5)
WBC: 9.2 10*3/uL (ref 4.0–10.5)

## 2022-09-09 LAB — HEPATIC FUNCTION PANEL
ALT: 19 U/L (ref 0–35)
AST: 21 U/L (ref 0–37)
Albumin: 4.4 g/dL (ref 3.5–5.2)
Alkaline Phosphatase: 87 U/L (ref 39–117)
Bilirubin, Direct: 0 mg/dL (ref 0.0–0.3)
Total Bilirubin: 0.4 mg/dL (ref 0.2–1.2)
Total Protein: 8.5 g/dL — ABNORMAL HIGH (ref 6.0–8.3)

## 2022-09-09 LAB — LIPID PANEL
Cholesterol: 242 mg/dL — ABNORMAL HIGH (ref 0–200)
HDL: 38.7 mg/dL — ABNORMAL LOW (ref >39.00)
NonHDL: 203.38
Total CHOL/HDL Ratio: 6
Triglycerides: 274 mg/dL — ABNORMAL HIGH (ref 0.0–149.0)
VLDL: 54.8 mg/dL — ABNORMAL HIGH (ref 0.0–40.0)

## 2022-09-09 LAB — BASIC METABOLIC PANEL
BUN: 39 mg/dL — ABNORMAL HIGH (ref 6–23)
CO2: 28 mEq/L (ref 19–32)
Calcium: 9.2 mg/dL (ref 8.4–10.5)
Chloride: 89 mEq/L — ABNORMAL LOW (ref 96–112)
Creatinine, Ser: 8.61 mg/dL (ref 0.40–1.20)
GFR: 4.5 mL/min — CL (ref >60.00)
Glucose, Bld: 159 mg/dL — ABNORMAL HIGH (ref 70–99)
Potassium: 5.5 mEq/L — ABNORMAL HIGH (ref 3.5–5.1)
Sodium: 134 mEq/L — ABNORMAL LOW (ref 135–145)

## 2022-09-09 LAB — HEMOGLOBIN A1C: Hgb A1c MFr Bld: 6.6 % — ABNORMAL HIGH (ref 4.6–6.5)

## 2022-09-09 LAB — LDL CHOLESTEROL, DIRECT: Direct LDL: 109 mg/dL

## 2022-09-09 LAB — TSH: TSH: 0.68 u[IU]/mL (ref 0.35–5.50)

## 2022-09-09 NOTE — Assessment & Plan Note (Signed)
Chronic problem.  Currently diet controlled.  Overdue on A1C.  No need for microalbumin due to HD.  Encouraged her to schedule eye exam.  UTD on foot exam.  Check labs.  Start meds prn.

## 2022-09-09 NOTE — Telephone Encounter (Signed)
Caller name: [facility] Rosey Bath  On DPR?: Yes  Call back number: [phone]   Provider they see: Sheliah Hatch, MD  Reason for call: Portable Oxygen Concentrator updated order for evaluation.

## 2022-09-09 NOTE — Telephone Encounter (Signed)
Looking for the liters of oxygen the patient should be on can you advise this?

## 2022-09-09 NOTE — Assessment & Plan Note (Signed)
Chronic problem.  Currently on Lipitor 40mg daily w/o difficulty.  Check labs.  Adjust meds prn  

## 2022-09-09 NOTE — Progress Notes (Signed)
   Subjective:    Patient ID: [name] [name], female    DOB: [birth year], 67 y.o.   MRN: [id number]  HPI Chronic respiratory failure- ongoing issue for pt.  Pt reports she uses O2 at home and at HD.  Started using O2 last year.  Needs small portable tank to go to HD. She is not certain of her DME provider but per chart, may be [facility].   DM- chronic problem.  Currently diet controlled.  Does not need microalbumin due to HD.  Due for eye exam.  UTD on foot exam.  Due for A1C.  No CP, HA's.  Some blurry vision at times.  Hyperlipidemia- chronic problem, on Lipitor 40mg  daily.  No abd pain, N/V.   Review of Systems For ROS see HPI     Objective:   Physical Exam Vitals reviewed.  Constitutional:      General: She is not in acute distress.    Appearance: Normal appearance. She is well-developed.  HENT:     Head: Normocephalic and atraumatic.  Eyes:     Conjunctiva/sclera: Conjunctivae normal.     Pupils: Pupils are equal, round, and reactive to light.  Neck:     Thyroid: No thyromegaly.  Cardiovascular:     Rate and Rhythm: Normal rate and regular rhythm.     Heart sounds: Murmur (II/VI SEM at RUSB) heard.  Pulmonary:     Effort: Pulmonary effort is normal. No respiratory distress.     Breath sounds: Normal breath sounds.  Abdominal:     General: There is no distension.     Palpations: Abdomen is soft.     Tenderness: There is no abdominal tenderness.  Musculoskeletal:     Cervical back: Normal range of motion and neck supple.     Right lower leg: No edema.     Left lower leg: No edema.  Lymphadenopathy:     Cervical: No cervical adenopathy.  Skin:    General: Skin is warm and dry.  Neurological:     Mental Status: She is alert and oriented to person, place, and time. Mental status is at baseline.  Psychiatric:        Mood and Affect: Mood normal.        Behavior: Behavior normal.        Thought Content: Thought content normal.           Assessment & Plan:

## 2022-09-09 NOTE — Patient Instructions (Signed)
Schedule your complete physical for October We'll notify you of your lab results and make any changes if needed We'll get the Oxygen sorted out and they should call you to set up delivery We'll call you to schedule your mammogram and GI appts SCHEDULE your eye exam Call with any questions or concerns Hang in there! Have a great summer!!!

## 2022-09-09 NOTE — Assessment & Plan Note (Signed)
Ongoing issue.  Pt uses 4.5L O2 while at home but doesn't have a portable tank to use when she is out of the house.  Will fax order to [facility].  Pt expressed understanding and is in agreement w/ plan.

## 2022-09-10 ENCOUNTER — Telehealth: Payer: Self-pay

## 2022-09-10 DIAGNOSIS — Z992 Dependence on renal dialysis: Secondary | ICD-10-CM | POA: Diagnosis not present

## 2022-09-10 DIAGNOSIS — N186 End stage renal disease: Secondary | ICD-10-CM | POA: Diagnosis not present

## 2022-09-10 DIAGNOSIS — N2581 Secondary hyperparathyroidism of renal origin: Secondary | ICD-10-CM | POA: Diagnosis not present

## 2022-09-10 DIAGNOSIS — D689 Coagulation defect, unspecified: Secondary | ICD-10-CM | POA: Diagnosis not present

## 2022-09-10 DIAGNOSIS — D631 Anemia in chronic kidney disease: Secondary | ICD-10-CM | POA: Diagnosis not present

## 2022-09-10 DIAGNOSIS — L299 Pruritus, unspecified: Secondary | ICD-10-CM | POA: Diagnosis not present

## 2022-09-10 NOTE — Telephone Encounter (Signed)
Called [facility] again and they will not accept a verbal order can we write this on Rx pad and I will fax z/

## 2022-09-10 NOTE — Telephone Encounter (Signed)
I don't know what a pulse dosage is, so based on what they are asking, please give order for respiratory therapy to evaluate

## 2022-09-10 NOTE — Telephone Encounter (Signed)
Patient Name First: [name] Last: [name] Gender: Female DOB: 11-Jan-1956 Age: 67 Y 6 M 5 D Return Phone Number: [phone] (Primary), [phone] (Secondary), [phone] (Alternate) Address: City/ State/ Zip: [place] [facility]istic[facility] [place] Night - C Client Site [facility] - Night Provider Lezlie Octave- MD Contact Type Call Who Is Calling Lab / Radiology Lab Name [facility] Lab Phone Number [phone] Lab Tech Name [facility] Lab Reference Number N/A Chief Complaint Lab Result (Critical or Stat) Call Type Lab Send to RN Reason for Call Report lab results Initial Comment Caller states with [place] with a critical lab report. Translation No Nurse Assessment Nurse: Lorin Picket, RN, Elnita Maxwell Date/Time (Eastern Time): 09/09/2022 5:14:45 PM Confirm and document reason for call. If symptomatic, describe symptoms. ---Caller is calling from [facility] to report a critical lab result Does the patient have any new or worsening symptoms? ---No Nurse: Lorin Picket, RN, Elnita Maxwell Date/Time (Eastern Time): 09/09/2022 5:15:19 PM Is there an on-call provider listed? ---Yes Please list name of person reporting value (Lab Employee) and a contact number. ---Henderson Cloud [phone] Please document the following items: Lab name Lab value (read back to lab to verify) Reference range for lab value Date and time blood was drawn Collect time of birth for bilirubin results ---Lab name-Creatinine Lab value 8.61 Reference range 0.40-1.20, GFR-4.50 Reference range >15 Date and time blood was drawn-09/09/22 @ 1108 am Ordering physician Dr Beverely Low Previous on 12/03/21 CR-9.17 GFR-4.19 Please collect the patient contact information from the lab. (name, phone number and address) ---Lindzie Goe [phone] Primary [phone] Secondary [phone] Tertiary List any special notes provided by lab. ---none Disp. Time Lamount Cohen Time)  Disposition Final User 09/09/2022 5:29:54 PM Called On-Call Provider Lorin Picket, RN, Elnita Maxwell PLEASE NOTE: All timestamps contained within this report are represented as [place] Standard Time. CONFIDENTIALTY NOTICE: This fax transmission is intended only for the addressee. It contains information that is legally privileged, confidential or otherwise protected from use or disclosure. If you are not the intended recipient, you are strictly prohibited from reviewing, disclosing, copying using or disseminating any of this information or taking any action in reliance on or regarding this information. If you have received this fax in error, please notify us immediately by telephone so that we can arrange for its return to [place]. Phone: [phone], Toll-Free: [phone], Fax: [phone] Page: 2 of 2 Call Id: [id number] Disp. Time Lamount Cohen Time) Disposition Final User 09/09/2022 5:30:58 PM Clinical Call Yes Lorin Picket, RN, Elnita Maxwell Final Disposition 09/09/2022 5:30:58 PM Clinical Call Yes Lorin Picket, RN, [facility] Phone DateTime Result/ Outcome Message Type Notes Sonda Primes MD [phone] 09/09/2022 5:29:54 PM Called On Call Provider - Reached Doctor Paged Sonda Primes - MD 09/09/2022 5:30:51 PM Spoke with On Call - General Message Result Reorted lab value to [facility] Lab value 8.61 Reference range 0.40-1.20, GFR-4.50 Previous on 12/03/21 CR-9.17 GFR-4.19  Will ask coworkers about this pt ASAP.

## 2022-09-10 NOTE — Telephone Encounter (Signed)
-----   Message from Sheliah Hatch, MD sent at 09/10/2022  7:15 AM EDT ----- A1C is better at 6.2%  Keep up the good work!  LDL (bad cholesterol) is better!  This is great news!  Potassium is mildly elevated but this will be addressed at dialysis  Overall, labs are stable and look good!

## 2022-09-10 NOTE — Telephone Encounter (Signed)
Spoke to [place] at [facility] she states the order needs to specify the pulse dosage and if we want the Resp therapist to elevated we need to send an order stating we want them to elevate for POC

## 2022-09-10 NOTE — Telephone Encounter (Signed)
As noted on the order, she is on 4.5L of O2 per minute

## 2022-09-10 NOTE — Telephone Encounter (Signed)
I have informed pt of lab results  

## 2022-09-12 DIAGNOSIS — N2581 Secondary hyperparathyroidism of renal origin: Secondary | ICD-10-CM | POA: Diagnosis not present

## 2022-09-12 DIAGNOSIS — D689 Coagulation defect, unspecified: Secondary | ICD-10-CM | POA: Diagnosis not present

## 2022-09-12 DIAGNOSIS — L299 Pruritus, unspecified: Secondary | ICD-10-CM | POA: Diagnosis not present

## 2022-09-12 DIAGNOSIS — Z992 Dependence on renal dialysis: Secondary | ICD-10-CM | POA: Diagnosis not present

## 2022-09-12 DIAGNOSIS — N186 End stage renal disease: Secondary | ICD-10-CM | POA: Diagnosis not present

## 2022-09-12 DIAGNOSIS — D631 Anemia in chronic kidney disease: Secondary | ICD-10-CM | POA: Diagnosis not present

## 2022-09-15 DIAGNOSIS — N186 End stage renal disease: Secondary | ICD-10-CM | POA: Diagnosis not present

## 2022-09-15 DIAGNOSIS — D689 Coagulation defect, unspecified: Secondary | ICD-10-CM | POA: Diagnosis not present

## 2022-09-15 DIAGNOSIS — D631 Anemia in chronic kidney disease: Secondary | ICD-10-CM | POA: Diagnosis not present

## 2022-09-15 DIAGNOSIS — L299 Pruritus, unspecified: Secondary | ICD-10-CM | POA: Diagnosis not present

## 2022-09-15 DIAGNOSIS — N2581 Secondary hyperparathyroidism of renal origin: Secondary | ICD-10-CM | POA: Diagnosis not present

## 2022-09-15 DIAGNOSIS — Z992 Dependence on renal dialysis: Secondary | ICD-10-CM | POA: Diagnosis not present

## 2022-09-17 DIAGNOSIS — N186 End stage renal disease: Secondary | ICD-10-CM | POA: Diagnosis not present

## 2022-09-17 DIAGNOSIS — D689 Coagulation defect, unspecified: Secondary | ICD-10-CM | POA: Diagnosis not present

## 2022-09-17 DIAGNOSIS — D631 Anemia in chronic kidney disease: Secondary | ICD-10-CM | POA: Diagnosis not present

## 2022-09-17 DIAGNOSIS — N2581 Secondary hyperparathyroidism of renal origin: Secondary | ICD-10-CM | POA: Diagnosis not present

## 2022-09-17 DIAGNOSIS — Z992 Dependence on renal dialysis: Secondary | ICD-10-CM | POA: Diagnosis not present

## 2022-09-17 DIAGNOSIS — L299 Pruritus, unspecified: Secondary | ICD-10-CM | POA: Diagnosis not present

## 2022-09-19 DIAGNOSIS — L299 Pruritus, unspecified: Secondary | ICD-10-CM | POA: Diagnosis not present

## 2022-09-19 DIAGNOSIS — D631 Anemia in chronic kidney disease: Secondary | ICD-10-CM | POA: Diagnosis not present

## 2022-09-19 DIAGNOSIS — Z992 Dependence on renal dialysis: Secondary | ICD-10-CM | POA: Diagnosis not present

## 2022-09-19 DIAGNOSIS — N186 End stage renal disease: Secondary | ICD-10-CM | POA: Diagnosis not present

## 2022-09-19 DIAGNOSIS — N2581 Secondary hyperparathyroidism of renal origin: Secondary | ICD-10-CM | POA: Diagnosis not present

## 2022-09-19 DIAGNOSIS — D689 Coagulation defect, unspecified: Secondary | ICD-10-CM | POA: Diagnosis not present

## 2022-09-21 DIAGNOSIS — I150 Renovascular hypertension: Secondary | ICD-10-CM | POA: Diagnosis not present

## 2022-09-21 DIAGNOSIS — Z992 Dependence on renal dialysis: Secondary | ICD-10-CM | POA: Diagnosis not present

## 2022-09-21 DIAGNOSIS — N186 End stage renal disease: Secondary | ICD-10-CM | POA: Diagnosis not present

## 2022-09-22 DIAGNOSIS — E1129 Type 2 diabetes mellitus with other diabetic kidney complication: Secondary | ICD-10-CM | POA: Diagnosis not present

## 2022-09-22 DIAGNOSIS — R0602 Shortness of breath: Secondary | ICD-10-CM | POA: Diagnosis not present

## 2022-09-22 DIAGNOSIS — D631 Anemia in chronic kidney disease: Secondary | ICD-10-CM | POA: Diagnosis not present

## 2022-09-22 DIAGNOSIS — L299 Pruritus, unspecified: Secondary | ICD-10-CM | POA: Diagnosis not present

## 2022-09-22 DIAGNOSIS — N186 End stage renal disease: Secondary | ICD-10-CM | POA: Diagnosis not present

## 2022-09-22 DIAGNOSIS — Z992 Dependence on renal dialysis: Secondary | ICD-10-CM | POA: Diagnosis not present

## 2022-09-22 DIAGNOSIS — D689 Coagulation defect, unspecified: Secondary | ICD-10-CM | POA: Diagnosis not present

## 2022-09-22 DIAGNOSIS — N2581 Secondary hyperparathyroidism of renal origin: Secondary | ICD-10-CM | POA: Diagnosis not present

## 2022-09-23 DIAGNOSIS — Z008 Encounter for other general examination: Secondary | ICD-10-CM | POA: Diagnosis not present

## 2022-09-23 DIAGNOSIS — F419 Anxiety disorder, unspecified: Secondary | ICD-10-CM | POA: Diagnosis not present

## 2022-09-23 DIAGNOSIS — E785 Hyperlipidemia, unspecified: Secondary | ICD-10-CM | POA: Diagnosis not present

## 2022-09-23 DIAGNOSIS — M810 Age-related osteoporosis without current pathological fracture: Secondary | ICD-10-CM | POA: Diagnosis not present

## 2022-09-23 DIAGNOSIS — J4489 Other specified chronic obstructive pulmonary disease: Secondary | ICD-10-CM | POA: Diagnosis not present

## 2022-09-23 DIAGNOSIS — I509 Heart failure, unspecified: Secondary | ICD-10-CM | POA: Diagnosis not present

## 2022-09-23 DIAGNOSIS — M1990 Unspecified osteoarthritis, unspecified site: Secondary | ICD-10-CM | POA: Diagnosis not present

## 2022-09-23 DIAGNOSIS — I25119 Atherosclerotic heart disease of native coronary artery with unspecified angina pectoris: Secondary | ICD-10-CM | POA: Diagnosis not present

## 2022-09-23 DIAGNOSIS — K219 Gastro-esophageal reflux disease without esophagitis: Secondary | ICD-10-CM | POA: Diagnosis not present

## 2022-09-23 DIAGNOSIS — E669 Obesity, unspecified: Secondary | ICD-10-CM | POA: Diagnosis not present

## 2022-09-23 DIAGNOSIS — E1151 Type 2 diabetes mellitus with diabetic peripheral angiopathy without gangrene: Secondary | ICD-10-CM | POA: Diagnosis not present

## 2022-09-24 DIAGNOSIS — D631 Anemia in chronic kidney disease: Secondary | ICD-10-CM | POA: Diagnosis not present

## 2022-09-24 DIAGNOSIS — N2581 Secondary hyperparathyroidism of renal origin: Secondary | ICD-10-CM | POA: Diagnosis not present

## 2022-09-24 DIAGNOSIS — D689 Coagulation defect, unspecified: Secondary | ICD-10-CM | POA: Diagnosis not present

## 2022-09-24 DIAGNOSIS — Z992 Dependence on renal dialysis: Secondary | ICD-10-CM | POA: Diagnosis not present

## 2022-09-24 DIAGNOSIS — L299 Pruritus, unspecified: Secondary | ICD-10-CM | POA: Diagnosis not present

## 2022-09-24 DIAGNOSIS — N186 End stage renal disease: Secondary | ICD-10-CM | POA: Diagnosis not present

## 2022-09-24 DIAGNOSIS — E1129 Type 2 diabetes mellitus with other diabetic kidney complication: Secondary | ICD-10-CM | POA: Diagnosis not present

## 2022-09-24 NOTE — Telephone Encounter (Signed)
Rx has been faxed to [facility] [phone]

## 2022-09-26 DIAGNOSIS — D631 Anemia in chronic kidney disease: Secondary | ICD-10-CM | POA: Diagnosis not present

## 2022-09-26 DIAGNOSIS — E1129 Type 2 diabetes mellitus with other diabetic kidney complication: Secondary | ICD-10-CM | POA: Diagnosis not present

## 2022-09-26 DIAGNOSIS — N186 End stage renal disease: Secondary | ICD-10-CM | POA: Diagnosis not present

## 2022-09-26 DIAGNOSIS — N2581 Secondary hyperparathyroidism of renal origin: Secondary | ICD-10-CM | POA: Diagnosis not present

## 2022-09-26 DIAGNOSIS — D689 Coagulation defect, unspecified: Secondary | ICD-10-CM | POA: Diagnosis not present

## 2022-09-26 DIAGNOSIS — Z992 Dependence on renal dialysis: Secondary | ICD-10-CM | POA: Diagnosis not present

## 2022-09-26 DIAGNOSIS — L299 Pruritus, unspecified: Secondary | ICD-10-CM | POA: Diagnosis not present

## 2022-09-29 ENCOUNTER — Other Ambulatory Visit: Payer: Self-pay | Admitting: Family Medicine

## 2022-09-29 DIAGNOSIS — N186 End stage renal disease: Secondary | ICD-10-CM | POA: Diagnosis not present

## 2022-09-29 DIAGNOSIS — I10 Essential (primary) hypertension: Secondary | ICD-10-CM

## 2022-09-29 DIAGNOSIS — D631 Anemia in chronic kidney disease: Secondary | ICD-10-CM | POA: Diagnosis not present

## 2022-09-29 DIAGNOSIS — N2581 Secondary hyperparathyroidism of renal origin: Secondary | ICD-10-CM | POA: Diagnosis not present

## 2022-09-29 DIAGNOSIS — D689 Coagulation defect, unspecified: Secondary | ICD-10-CM | POA: Diagnosis not present

## 2022-09-29 DIAGNOSIS — Z992 Dependence on renal dialysis: Secondary | ICD-10-CM | POA: Diagnosis not present

## 2022-09-29 DIAGNOSIS — E1129 Type 2 diabetes mellitus with other diabetic kidney complication: Secondary | ICD-10-CM | POA: Diagnosis not present

## 2022-09-29 DIAGNOSIS — L299 Pruritus, unspecified: Secondary | ICD-10-CM | POA: Diagnosis not present

## 2022-10-01 DIAGNOSIS — Z992 Dependence on renal dialysis: Secondary | ICD-10-CM | POA: Diagnosis not present

## 2022-10-01 DIAGNOSIS — N186 End stage renal disease: Secondary | ICD-10-CM | POA: Diagnosis not present

## 2022-10-01 DIAGNOSIS — E1129 Type 2 diabetes mellitus with other diabetic kidney complication: Secondary | ICD-10-CM | POA: Diagnosis not present

## 2022-10-01 DIAGNOSIS — N2581 Secondary hyperparathyroidism of renal origin: Secondary | ICD-10-CM | POA: Diagnosis not present

## 2022-10-01 DIAGNOSIS — L299 Pruritus, unspecified: Secondary | ICD-10-CM | POA: Diagnosis not present

## 2022-10-01 DIAGNOSIS — D631 Anemia in chronic kidney disease: Secondary | ICD-10-CM | POA: Diagnosis not present

## 2022-10-01 DIAGNOSIS — D689 Coagulation defect, unspecified: Secondary | ICD-10-CM | POA: Diagnosis not present

## 2022-10-03 DIAGNOSIS — L299 Pruritus, unspecified: Secondary | ICD-10-CM | POA: Diagnosis not present

## 2022-10-03 DIAGNOSIS — E1129 Type 2 diabetes mellitus with other diabetic kidney complication: Secondary | ICD-10-CM | POA: Diagnosis not present

## 2022-10-03 DIAGNOSIS — Z992 Dependence on renal dialysis: Secondary | ICD-10-CM | POA: Diagnosis not present

## 2022-10-03 DIAGNOSIS — D689 Coagulation defect, unspecified: Secondary | ICD-10-CM | POA: Diagnosis not present

## 2022-10-03 DIAGNOSIS — D631 Anemia in chronic kidney disease: Secondary | ICD-10-CM | POA: Diagnosis not present

## 2022-10-03 DIAGNOSIS — N186 End stage renal disease: Secondary | ICD-10-CM | POA: Diagnosis not present

## 2022-10-03 DIAGNOSIS — N2581 Secondary hyperparathyroidism of renal origin: Secondary | ICD-10-CM | POA: Diagnosis not present

## 2022-10-06 DIAGNOSIS — N186 End stage renal disease: Secondary | ICD-10-CM | POA: Diagnosis not present

## 2022-10-06 DIAGNOSIS — Z992 Dependence on renal dialysis: Secondary | ICD-10-CM | POA: Diagnosis not present

## 2022-10-06 DIAGNOSIS — L299 Pruritus, unspecified: Secondary | ICD-10-CM | POA: Diagnosis not present

## 2022-10-06 DIAGNOSIS — D631 Anemia in chronic kidney disease: Secondary | ICD-10-CM | POA: Diagnosis not present

## 2022-10-06 DIAGNOSIS — D689 Coagulation defect, unspecified: Secondary | ICD-10-CM | POA: Diagnosis not present

## 2022-10-06 DIAGNOSIS — N2581 Secondary hyperparathyroidism of renal origin: Secondary | ICD-10-CM | POA: Diagnosis not present

## 2022-10-06 DIAGNOSIS — E1129 Type 2 diabetes mellitus with other diabetic kidney complication: Secondary | ICD-10-CM | POA: Diagnosis not present

## 2022-10-07 DIAGNOSIS — M792 Neuralgia and neuritis, unspecified: Secondary | ICD-10-CM | POA: Diagnosis not present

## 2022-10-08 DIAGNOSIS — D689 Coagulation defect, unspecified: Secondary | ICD-10-CM | POA: Diagnosis not present

## 2022-10-08 DIAGNOSIS — N186 End stage renal disease: Secondary | ICD-10-CM | POA: Diagnosis not present

## 2022-10-08 DIAGNOSIS — Z992 Dependence on renal dialysis: Secondary | ICD-10-CM | POA: Diagnosis not present

## 2022-10-08 DIAGNOSIS — D631 Anemia in chronic kidney disease: Secondary | ICD-10-CM | POA: Diagnosis not present

## 2022-10-08 DIAGNOSIS — L299 Pruritus, unspecified: Secondary | ICD-10-CM | POA: Diagnosis not present

## 2022-10-08 DIAGNOSIS — R0602 Shortness of breath: Secondary | ICD-10-CM | POA: Diagnosis not present

## 2022-10-08 DIAGNOSIS — S93402A Sprain of unspecified ligament of left ankle, initial encounter: Secondary | ICD-10-CM | POA: Diagnosis not present

## 2022-10-08 DIAGNOSIS — J9611 Chronic respiratory failure with hypoxia: Secondary | ICD-10-CM | POA: Diagnosis not present

## 2022-10-08 DIAGNOSIS — N2581 Secondary hyperparathyroidism of renal origin: Secondary | ICD-10-CM | POA: Diagnosis not present

## 2022-10-08 DIAGNOSIS — E1129 Type 2 diabetes mellitus with other diabetic kidney complication: Secondary | ICD-10-CM | POA: Diagnosis not present

## 2022-10-09 ENCOUNTER — Ambulatory Visit: Payer: Medicare HMO

## 2022-10-09 DIAGNOSIS — E1129 Type 2 diabetes mellitus with other diabetic kidney complication: Secondary | ICD-10-CM | POA: Diagnosis not present

## 2022-10-09 DIAGNOSIS — N186 End stage renal disease: Secondary | ICD-10-CM | POA: Diagnosis not present

## 2022-10-09 DIAGNOSIS — L299 Pruritus, unspecified: Secondary | ICD-10-CM | POA: Diagnosis not present

## 2022-10-09 DIAGNOSIS — D631 Anemia in chronic kidney disease: Secondary | ICD-10-CM | POA: Diagnosis not present

## 2022-10-09 DIAGNOSIS — N2581 Secondary hyperparathyroidism of renal origin: Secondary | ICD-10-CM | POA: Diagnosis not present

## 2022-10-09 DIAGNOSIS — D689 Coagulation defect, unspecified: Secondary | ICD-10-CM | POA: Diagnosis not present

## 2022-10-09 DIAGNOSIS — Z992 Dependence on renal dialysis: Secondary | ICD-10-CM | POA: Diagnosis not present

## 2022-10-13 DIAGNOSIS — D631 Anemia in chronic kidney disease: Secondary | ICD-10-CM | POA: Diagnosis not present

## 2022-10-13 DIAGNOSIS — N186 End stage renal disease: Secondary | ICD-10-CM | POA: Diagnosis not present

## 2022-10-13 DIAGNOSIS — E1129 Type 2 diabetes mellitus with other diabetic kidney complication: Secondary | ICD-10-CM | POA: Diagnosis not present

## 2022-10-13 DIAGNOSIS — L299 Pruritus, unspecified: Secondary | ICD-10-CM | POA: Diagnosis not present

## 2022-10-13 DIAGNOSIS — N2581 Secondary hyperparathyroidism of renal origin: Secondary | ICD-10-CM | POA: Diagnosis not present

## 2022-10-13 DIAGNOSIS — Z992 Dependence on renal dialysis: Secondary | ICD-10-CM | POA: Diagnosis not present

## 2022-10-13 DIAGNOSIS — D689 Coagulation defect, unspecified: Secondary | ICD-10-CM | POA: Diagnosis not present

## 2022-10-15 DIAGNOSIS — L299 Pruritus, unspecified: Secondary | ICD-10-CM | POA: Diagnosis not present

## 2022-10-15 DIAGNOSIS — N2581 Secondary hyperparathyroidism of renal origin: Secondary | ICD-10-CM | POA: Diagnosis not present

## 2022-10-15 DIAGNOSIS — E1129 Type 2 diabetes mellitus with other diabetic kidney complication: Secondary | ICD-10-CM | POA: Diagnosis not present

## 2022-10-15 DIAGNOSIS — D631 Anemia in chronic kidney disease: Secondary | ICD-10-CM | POA: Diagnosis not present

## 2022-10-15 DIAGNOSIS — D689 Coagulation defect, unspecified: Secondary | ICD-10-CM | POA: Diagnosis not present

## 2022-10-15 DIAGNOSIS — N186 End stage renal disease: Secondary | ICD-10-CM | POA: Diagnosis not present

## 2022-10-15 DIAGNOSIS — Z992 Dependence on renal dialysis: Secondary | ICD-10-CM | POA: Diagnosis not present

## 2022-10-17 DIAGNOSIS — Z992 Dependence on renal dialysis: Secondary | ICD-10-CM | POA: Diagnosis not present

## 2022-10-17 DIAGNOSIS — D689 Coagulation defect, unspecified: Secondary | ICD-10-CM | POA: Diagnosis not present

## 2022-10-17 DIAGNOSIS — N2581 Secondary hyperparathyroidism of renal origin: Secondary | ICD-10-CM | POA: Diagnosis not present

## 2022-10-17 DIAGNOSIS — L299 Pruritus, unspecified: Secondary | ICD-10-CM | POA: Diagnosis not present

## 2022-10-17 DIAGNOSIS — N186 End stage renal disease: Secondary | ICD-10-CM | POA: Diagnosis not present

## 2022-10-17 DIAGNOSIS — E1129 Type 2 diabetes mellitus with other diabetic kidney complication: Secondary | ICD-10-CM | POA: Diagnosis not present

## 2022-10-17 DIAGNOSIS — D631 Anemia in chronic kidney disease: Secondary | ICD-10-CM | POA: Diagnosis not present

## 2022-10-20 DIAGNOSIS — Z992 Dependence on renal dialysis: Secondary | ICD-10-CM | POA: Diagnosis not present

## 2022-10-20 DIAGNOSIS — N186 End stage renal disease: Secondary | ICD-10-CM | POA: Diagnosis not present

## 2022-10-20 DIAGNOSIS — D631 Anemia in chronic kidney disease: Secondary | ICD-10-CM | POA: Diagnosis not present

## 2022-10-20 DIAGNOSIS — D689 Coagulation defect, unspecified: Secondary | ICD-10-CM | POA: Diagnosis not present

## 2022-10-20 DIAGNOSIS — E1129 Type 2 diabetes mellitus with other diabetic kidney complication: Secondary | ICD-10-CM | POA: Diagnosis not present

## 2022-10-20 DIAGNOSIS — N2581 Secondary hyperparathyroidism of renal origin: Secondary | ICD-10-CM | POA: Diagnosis not present

## 2022-10-20 DIAGNOSIS — L299 Pruritus, unspecified: Secondary | ICD-10-CM | POA: Diagnosis not present

## 2022-10-22 DIAGNOSIS — D689 Coagulation defect, unspecified: Secondary | ICD-10-CM | POA: Diagnosis not present

## 2022-10-22 DIAGNOSIS — N186 End stage renal disease: Secondary | ICD-10-CM | POA: Diagnosis not present

## 2022-10-22 DIAGNOSIS — I150 Renovascular hypertension: Secondary | ICD-10-CM | POA: Diagnosis not present

## 2022-10-22 DIAGNOSIS — N2581 Secondary hyperparathyroidism of renal origin: Secondary | ICD-10-CM | POA: Diagnosis not present

## 2022-10-22 DIAGNOSIS — L299 Pruritus, unspecified: Secondary | ICD-10-CM | POA: Diagnosis not present

## 2022-10-22 DIAGNOSIS — E1129 Type 2 diabetes mellitus with other diabetic kidney complication: Secondary | ICD-10-CM | POA: Diagnosis not present

## 2022-10-22 DIAGNOSIS — Z992 Dependence on renal dialysis: Secondary | ICD-10-CM | POA: Diagnosis not present

## 2022-10-22 DIAGNOSIS — D631 Anemia in chronic kidney disease: Secondary | ICD-10-CM | POA: Diagnosis not present

## 2022-10-24 DIAGNOSIS — E1129 Type 2 diabetes mellitus with other diabetic kidney complication: Secondary | ICD-10-CM | POA: Diagnosis not present

## 2022-10-24 DIAGNOSIS — L299 Pruritus, unspecified: Secondary | ICD-10-CM | POA: Diagnosis not present

## 2022-10-24 DIAGNOSIS — N2581 Secondary hyperparathyroidism of renal origin: Secondary | ICD-10-CM | POA: Diagnosis not present

## 2022-10-24 DIAGNOSIS — N186 End stage renal disease: Secondary | ICD-10-CM | POA: Diagnosis not present

## 2022-10-24 DIAGNOSIS — D689 Coagulation defect, unspecified: Secondary | ICD-10-CM | POA: Diagnosis not present

## 2022-10-24 DIAGNOSIS — Z992 Dependence on renal dialysis: Secondary | ICD-10-CM | POA: Diagnosis not present

## 2022-10-24 DIAGNOSIS — D631 Anemia in chronic kidney disease: Secondary | ICD-10-CM | POA: Diagnosis not present

## 2022-10-27 DIAGNOSIS — Z992 Dependence on renal dialysis: Secondary | ICD-10-CM | POA: Diagnosis not present

## 2022-10-27 DIAGNOSIS — N186 End stage renal disease: Secondary | ICD-10-CM | POA: Diagnosis not present

## 2022-10-27 DIAGNOSIS — D689 Coagulation defect, unspecified: Secondary | ICD-10-CM | POA: Diagnosis not present

## 2022-10-27 DIAGNOSIS — N2581 Secondary hyperparathyroidism of renal origin: Secondary | ICD-10-CM | POA: Diagnosis not present

## 2022-10-27 DIAGNOSIS — E1129 Type 2 diabetes mellitus with other diabetic kidney complication: Secondary | ICD-10-CM | POA: Diagnosis not present

## 2022-10-27 DIAGNOSIS — L299 Pruritus, unspecified: Secondary | ICD-10-CM | POA: Diagnosis not present

## 2022-10-27 DIAGNOSIS — D631 Anemia in chronic kidney disease: Secondary | ICD-10-CM | POA: Diagnosis not present

## 2022-10-29 DIAGNOSIS — E1129 Type 2 diabetes mellitus with other diabetic kidney complication: Secondary | ICD-10-CM | POA: Diagnosis not present

## 2022-10-29 DIAGNOSIS — D689 Coagulation defect, unspecified: Secondary | ICD-10-CM | POA: Diagnosis not present

## 2022-10-29 DIAGNOSIS — N186 End stage renal disease: Secondary | ICD-10-CM | POA: Diagnosis not present

## 2022-10-29 DIAGNOSIS — D631 Anemia in chronic kidney disease: Secondary | ICD-10-CM | POA: Diagnosis not present

## 2022-10-29 DIAGNOSIS — L299 Pruritus, unspecified: Secondary | ICD-10-CM | POA: Diagnosis not present

## 2022-10-29 DIAGNOSIS — Z992 Dependence on renal dialysis: Secondary | ICD-10-CM | POA: Diagnosis not present

## 2022-10-29 DIAGNOSIS — N2581 Secondary hyperparathyroidism of renal origin: Secondary | ICD-10-CM | POA: Diagnosis not present

## 2022-10-31 DIAGNOSIS — D689 Coagulation defect, unspecified: Secondary | ICD-10-CM | POA: Diagnosis not present

## 2022-10-31 DIAGNOSIS — E1129 Type 2 diabetes mellitus with other diabetic kidney complication: Secondary | ICD-10-CM | POA: Diagnosis not present

## 2022-10-31 DIAGNOSIS — D631 Anemia in chronic kidney disease: Secondary | ICD-10-CM | POA: Diagnosis not present

## 2022-10-31 DIAGNOSIS — N2581 Secondary hyperparathyroidism of renal origin: Secondary | ICD-10-CM | POA: Diagnosis not present

## 2022-10-31 DIAGNOSIS — N186 End stage renal disease: Secondary | ICD-10-CM | POA: Diagnosis not present

## 2022-10-31 DIAGNOSIS — Z992 Dependence on renal dialysis: Secondary | ICD-10-CM | POA: Diagnosis not present

## 2022-10-31 DIAGNOSIS — L299 Pruritus, unspecified: Secondary | ICD-10-CM | POA: Diagnosis not present

## 2022-11-03 DIAGNOSIS — D689 Coagulation defect, unspecified: Secondary | ICD-10-CM | POA: Diagnosis not present

## 2022-11-03 DIAGNOSIS — L299 Pruritus, unspecified: Secondary | ICD-10-CM | POA: Diagnosis not present

## 2022-11-03 DIAGNOSIS — Z992 Dependence on renal dialysis: Secondary | ICD-10-CM | POA: Diagnosis not present

## 2022-11-03 DIAGNOSIS — N186 End stage renal disease: Secondary | ICD-10-CM | POA: Diagnosis not present

## 2022-11-03 DIAGNOSIS — D631 Anemia in chronic kidney disease: Secondary | ICD-10-CM | POA: Diagnosis not present

## 2022-11-03 DIAGNOSIS — E1129 Type 2 diabetes mellitus with other diabetic kidney complication: Secondary | ICD-10-CM | POA: Diagnosis not present

## 2022-11-03 DIAGNOSIS — N2581 Secondary hyperparathyroidism of renal origin: Secondary | ICD-10-CM | POA: Diagnosis not present

## 2022-11-05 DIAGNOSIS — E1129 Type 2 diabetes mellitus with other diabetic kidney complication: Secondary | ICD-10-CM | POA: Diagnosis not present

## 2022-11-05 DIAGNOSIS — Z992 Dependence on renal dialysis: Secondary | ICD-10-CM | POA: Diagnosis not present

## 2022-11-05 DIAGNOSIS — N186 End stage renal disease: Secondary | ICD-10-CM | POA: Diagnosis not present

## 2022-11-05 DIAGNOSIS — N2581 Secondary hyperparathyroidism of renal origin: Secondary | ICD-10-CM | POA: Diagnosis not present

## 2022-11-05 DIAGNOSIS — D689 Coagulation defect, unspecified: Secondary | ICD-10-CM | POA: Diagnosis not present

## 2022-11-05 DIAGNOSIS — L299 Pruritus, unspecified: Secondary | ICD-10-CM | POA: Diagnosis not present

## 2022-11-05 DIAGNOSIS — D631 Anemia in chronic kidney disease: Secondary | ICD-10-CM | POA: Diagnosis not present

## 2022-11-07 DIAGNOSIS — L299 Pruritus, unspecified: Secondary | ICD-10-CM | POA: Diagnosis not present

## 2022-11-07 DIAGNOSIS — N2581 Secondary hyperparathyroidism of renal origin: Secondary | ICD-10-CM | POA: Diagnosis not present

## 2022-11-07 DIAGNOSIS — D631 Anemia in chronic kidney disease: Secondary | ICD-10-CM | POA: Diagnosis not present

## 2022-11-07 DIAGNOSIS — D689 Coagulation defect, unspecified: Secondary | ICD-10-CM | POA: Diagnosis not present

## 2022-11-07 DIAGNOSIS — N186 End stage renal disease: Secondary | ICD-10-CM | POA: Diagnosis not present

## 2022-11-07 DIAGNOSIS — Z992 Dependence on renal dialysis: Secondary | ICD-10-CM | POA: Diagnosis not present

## 2022-11-07 DIAGNOSIS — E1129 Type 2 diabetes mellitus with other diabetic kidney complication: Secondary | ICD-10-CM | POA: Diagnosis not present

## 2022-11-08 DIAGNOSIS — J9611 Chronic respiratory failure with hypoxia: Secondary | ICD-10-CM | POA: Diagnosis not present

## 2022-11-08 DIAGNOSIS — R0602 Shortness of breath: Secondary | ICD-10-CM | POA: Diagnosis not present

## 2022-11-10 DIAGNOSIS — D689 Coagulation defect, unspecified: Secondary | ICD-10-CM | POA: Diagnosis not present

## 2022-11-10 DIAGNOSIS — N186 End stage renal disease: Secondary | ICD-10-CM | POA: Diagnosis not present

## 2022-11-10 DIAGNOSIS — D631 Anemia in chronic kidney disease: Secondary | ICD-10-CM | POA: Diagnosis not present

## 2022-11-10 DIAGNOSIS — L299 Pruritus, unspecified: Secondary | ICD-10-CM | POA: Diagnosis not present

## 2022-11-10 DIAGNOSIS — Z992 Dependence on renal dialysis: Secondary | ICD-10-CM | POA: Diagnosis not present

## 2022-11-10 DIAGNOSIS — N2581 Secondary hyperparathyroidism of renal origin: Secondary | ICD-10-CM | POA: Diagnosis not present

## 2022-11-10 DIAGNOSIS — E1129 Type 2 diabetes mellitus with other diabetic kidney complication: Secondary | ICD-10-CM | POA: Diagnosis not present

## 2022-11-12 DIAGNOSIS — D631 Anemia in chronic kidney disease: Secondary | ICD-10-CM | POA: Diagnosis not present

## 2022-11-12 DIAGNOSIS — E1129 Type 2 diabetes mellitus with other diabetic kidney complication: Secondary | ICD-10-CM | POA: Diagnosis not present

## 2022-11-12 DIAGNOSIS — N186 End stage renal disease: Secondary | ICD-10-CM | POA: Diagnosis not present

## 2022-11-12 DIAGNOSIS — D689 Coagulation defect, unspecified: Secondary | ICD-10-CM | POA: Diagnosis not present

## 2022-11-12 DIAGNOSIS — N2581 Secondary hyperparathyroidism of renal origin: Secondary | ICD-10-CM | POA: Diagnosis not present

## 2022-11-12 DIAGNOSIS — Z992 Dependence on renal dialysis: Secondary | ICD-10-CM | POA: Diagnosis not present

## 2022-11-12 DIAGNOSIS — L299 Pruritus, unspecified: Secondary | ICD-10-CM | POA: Diagnosis not present

## 2022-11-14 DIAGNOSIS — D631 Anemia in chronic kidney disease: Secondary | ICD-10-CM | POA: Diagnosis not present

## 2022-11-14 DIAGNOSIS — Z992 Dependence on renal dialysis: Secondary | ICD-10-CM | POA: Diagnosis not present

## 2022-11-14 DIAGNOSIS — D689 Coagulation defect, unspecified: Secondary | ICD-10-CM | POA: Diagnosis not present

## 2022-11-14 DIAGNOSIS — E1129 Type 2 diabetes mellitus with other diabetic kidney complication: Secondary | ICD-10-CM | POA: Diagnosis not present

## 2022-11-14 DIAGNOSIS — L299 Pruritus, unspecified: Secondary | ICD-10-CM | POA: Diagnosis not present

## 2022-11-14 DIAGNOSIS — N186 End stage renal disease: Secondary | ICD-10-CM | POA: Diagnosis not present

## 2022-11-14 DIAGNOSIS — N2581 Secondary hyperparathyroidism of renal origin: Secondary | ICD-10-CM | POA: Diagnosis not present

## 2022-11-17 DIAGNOSIS — L299 Pruritus, unspecified: Secondary | ICD-10-CM | POA: Diagnosis not present

## 2022-11-17 DIAGNOSIS — N186 End stage renal disease: Secondary | ICD-10-CM | POA: Diagnosis not present

## 2022-11-17 DIAGNOSIS — Z992 Dependence on renal dialysis: Secondary | ICD-10-CM | POA: Diagnosis not present

## 2022-11-17 DIAGNOSIS — N2581 Secondary hyperparathyroidism of renal origin: Secondary | ICD-10-CM | POA: Diagnosis not present

## 2022-11-17 DIAGNOSIS — E1129 Type 2 diabetes mellitus with other diabetic kidney complication: Secondary | ICD-10-CM | POA: Diagnosis not present

## 2022-11-17 DIAGNOSIS — D689 Coagulation defect, unspecified: Secondary | ICD-10-CM | POA: Diagnosis not present

## 2022-11-17 DIAGNOSIS — D631 Anemia in chronic kidney disease: Secondary | ICD-10-CM | POA: Diagnosis not present

## 2022-11-19 DIAGNOSIS — N186 End stage renal disease: Secondary | ICD-10-CM | POA: Diagnosis not present

## 2022-11-19 DIAGNOSIS — L299 Pruritus, unspecified: Secondary | ICD-10-CM | POA: Diagnosis not present

## 2022-11-19 DIAGNOSIS — N2581 Secondary hyperparathyroidism of renal origin: Secondary | ICD-10-CM | POA: Diagnosis not present

## 2022-11-19 DIAGNOSIS — E1129 Type 2 diabetes mellitus with other diabetic kidney complication: Secondary | ICD-10-CM | POA: Diagnosis not present

## 2022-11-19 DIAGNOSIS — D689 Coagulation defect, unspecified: Secondary | ICD-10-CM | POA: Diagnosis not present

## 2022-11-19 DIAGNOSIS — Z992 Dependence on renal dialysis: Secondary | ICD-10-CM | POA: Diagnosis not present

## 2022-11-19 DIAGNOSIS — D631 Anemia in chronic kidney disease: Secondary | ICD-10-CM | POA: Diagnosis not present

## 2022-11-21 DIAGNOSIS — E1129 Type 2 diabetes mellitus with other diabetic kidney complication: Secondary | ICD-10-CM | POA: Diagnosis not present

## 2022-11-21 DIAGNOSIS — N2581 Secondary hyperparathyroidism of renal origin: Secondary | ICD-10-CM | POA: Diagnosis not present

## 2022-11-21 DIAGNOSIS — D631 Anemia in chronic kidney disease: Secondary | ICD-10-CM | POA: Diagnosis not present

## 2022-11-21 DIAGNOSIS — D689 Coagulation defect, unspecified: Secondary | ICD-10-CM | POA: Diagnosis not present

## 2022-11-21 DIAGNOSIS — L299 Pruritus, unspecified: Secondary | ICD-10-CM | POA: Diagnosis not present

## 2022-11-21 DIAGNOSIS — Z992 Dependence on renal dialysis: Secondary | ICD-10-CM | POA: Diagnosis not present

## 2022-11-21 DIAGNOSIS — N186 End stage renal disease: Secondary | ICD-10-CM | POA: Diagnosis not present

## 2022-11-22 DIAGNOSIS — Z992 Dependence on renal dialysis: Secondary | ICD-10-CM | POA: Diagnosis not present

## 2022-11-22 DIAGNOSIS — N186 End stage renal disease: Secondary | ICD-10-CM | POA: Diagnosis not present

## 2022-11-22 DIAGNOSIS — I150 Renovascular hypertension: Secondary | ICD-10-CM | POA: Diagnosis not present

## 2022-11-24 DIAGNOSIS — D689 Coagulation defect, unspecified: Secondary | ICD-10-CM | POA: Diagnosis not present

## 2022-11-24 DIAGNOSIS — N186 End stage renal disease: Secondary | ICD-10-CM | POA: Diagnosis not present

## 2022-11-24 DIAGNOSIS — L299 Pruritus, unspecified: Secondary | ICD-10-CM | POA: Diagnosis not present

## 2022-11-24 DIAGNOSIS — Z992 Dependence on renal dialysis: Secondary | ICD-10-CM | POA: Diagnosis not present

## 2022-11-24 DIAGNOSIS — N2581 Secondary hyperparathyroidism of renal origin: Secondary | ICD-10-CM | POA: Diagnosis not present

## 2022-11-28 DIAGNOSIS — N186 End stage renal disease: Secondary | ICD-10-CM | POA: Diagnosis not present

## 2022-11-28 DIAGNOSIS — L299 Pruritus, unspecified: Secondary | ICD-10-CM | POA: Diagnosis not present

## 2022-11-28 DIAGNOSIS — N2581 Secondary hyperparathyroidism of renal origin: Secondary | ICD-10-CM | POA: Diagnosis not present

## 2022-11-28 DIAGNOSIS — Z992 Dependence on renal dialysis: Secondary | ICD-10-CM | POA: Diagnosis not present

## 2022-11-28 DIAGNOSIS — D689 Coagulation defect, unspecified: Secondary | ICD-10-CM | POA: Diagnosis not present

## 2022-12-01 DIAGNOSIS — D689 Coagulation defect, unspecified: Secondary | ICD-10-CM | POA: Diagnosis not present

## 2022-12-01 DIAGNOSIS — Z992 Dependence on renal dialysis: Secondary | ICD-10-CM | POA: Diagnosis not present

## 2022-12-01 DIAGNOSIS — L299 Pruritus, unspecified: Secondary | ICD-10-CM | POA: Diagnosis not present

## 2022-12-01 DIAGNOSIS — N186 End stage renal disease: Secondary | ICD-10-CM | POA: Diagnosis not present

## 2022-12-01 DIAGNOSIS — D631 Anemia in chronic kidney disease: Secondary | ICD-10-CM | POA: Diagnosis not present

## 2022-12-01 DIAGNOSIS — N2581 Secondary hyperparathyroidism of renal origin: Secondary | ICD-10-CM | POA: Diagnosis not present

## 2022-12-09 DIAGNOSIS — J9611 Chronic respiratory failure with hypoxia: Secondary | ICD-10-CM | POA: Diagnosis not present

## 2022-12-09 NOTE — Progress Notes (Signed)
No show

## 2022-12-16 ENCOUNTER — Telehealth: Payer: Self-pay | Admitting: Family Medicine

## 2022-12-16 NOTE — Telephone Encounter (Signed)
Caller name: [name]  On DPR?: Yes  Call back number: [phone] (mobile)  Provider they see: Sheliah Hatch, MD  Reason for call:   Wants to start back using sinus med but can't remember what it was.

## 2022-12-16 NOTE — Telephone Encounter (Signed)
Called left vm to clarify if this was OTC or rx. cetirizine (ZYRTEC) 10 MG tablet is in hx of medications.

## 2022-12-21 DIAGNOSIS — R55 Syncope and collapse: Secondary | ICD-10-CM | POA: Diagnosis not present

## 2022-12-21 DIAGNOSIS — R5383 Other fatigue: Secondary | ICD-10-CM | POA: Diagnosis not present

## 2022-12-21 DIAGNOSIS — R059 Cough, unspecified: Secondary | ICD-10-CM | POA: Diagnosis not present

## 2022-12-21 DIAGNOSIS — R404 Transient alteration of awareness: Secondary | ICD-10-CM | POA: Diagnosis not present

## 2022-12-21 DIAGNOSIS — Z743 Need for continuous supervision: Secondary | ICD-10-CM | POA: Diagnosis not present

## 2022-12-22 DIAGNOSIS — Z992 Dependence on renal dialysis: Secondary | ICD-10-CM | POA: Diagnosis not present

## 2022-12-22 DIAGNOSIS — I150 Renovascular hypertension: Secondary | ICD-10-CM | POA: Diagnosis not present

## 2022-12-22 DIAGNOSIS — N186 End stage renal disease: Secondary | ICD-10-CM | POA: Diagnosis not present

## 2022-12-23 DEATH — deceased

## 2022-12-26 ENCOUNTER — Other Ambulatory Visit: Payer: Self-pay | Admitting: Family Medicine

## 2022-12-26 DIAGNOSIS — Z1211 Encounter for screening for malignant neoplasm of colon: Secondary | ICD-10-CM

## 2022-12-26 DIAGNOSIS — Z1212 Encounter for screening for malignant neoplasm of rectum: Secondary | ICD-10-CM

## 2023-01-04 ENCOUNTER — Other Ambulatory Visit: Payer: Self-pay | Admitting: Family Medicine

## 2023-01-14 ENCOUNTER — Telehealth: Payer: Self-pay | Admitting: Family Medicine

## 2023-01-14 NOTE — Telephone Encounter (Signed)
Daughter called to let us know that pt unfortunately has passed away. She is available for phone call if we need more information

## 2023-01-22 ENCOUNTER — Encounter: Payer: Medicare HMO | Admitting: Family Medicine

## 2023-03-04 ENCOUNTER — Other Ambulatory Visit: Payer: Self-pay | Admitting: Family Medicine

## 2023-03-04 DIAGNOSIS — I10 Essential (primary) hypertension: Secondary | ICD-10-CM
